# Patient Record
Sex: Male | Born: 1945
Health system: Southern US, Community
[De-identification: ages and names within clinical notes are randomized; demographics above are authoritative.]

## PROBLEM LIST (undated history)

## (undated) ENCOUNTER — Emergency Department (HOSPITAL_COMMUNITY)

## (undated) DIAGNOSIS — Z8719 Personal history of other diseases of the digestive system: Secondary | ICD-10-CM

## (undated) DIAGNOSIS — C801 Malignant (primary) neoplasm, unspecified: Secondary | ICD-10-CM

## (undated) DIAGNOSIS — I5020 Unspecified systolic (congestive) heart failure: Secondary | ICD-10-CM

## (undated) DIAGNOSIS — E785 Hyperlipidemia, unspecified: Secondary | ICD-10-CM

## (undated) DIAGNOSIS — A809 Acute poliomyelitis, unspecified: Secondary | ICD-10-CM

## (undated) DIAGNOSIS — I219 Acute myocardial infarction, unspecified: Secondary | ICD-10-CM

## (undated) DIAGNOSIS — N529 Male erectile dysfunction, unspecified: Secondary | ICD-10-CM

## (undated) DIAGNOSIS — I2510 Atherosclerotic heart disease of native coronary artery without angina pectoris: Secondary | ICD-10-CM

## (undated) HISTORY — DX: Acute myocardial infarction, unspecified: I21.9

## (undated) HISTORY — PX: CORONARY STENT PLACEMENT: SHX1402

## (undated) HISTORY — DX: Unspecified systolic (congestive) heart failure: I50.20

## (undated) HISTORY — DX: Hyperlipidemia, unspecified: E78.5

## (undated) HISTORY — PX: CARDIAC CATHETERIZATION: SHX172

## (undated) HISTORY — DX: Acute poliomyelitis, unspecified: A80.9

## (undated) HISTORY — PX: APPENDECTOMY: SHX54

## (undated) HISTORY — PX: OTHER SURGICAL HISTORY: SHX169

## (undated) HISTORY — DX: Malignant (primary) neoplasm, unspecified: C80.1

## (undated) HISTORY — PX: CERVICAL SPINE SURGERY: SHX589

---

## 2000-08-31 ENCOUNTER — Inpatient Hospital Stay (HOSPITAL_COMMUNITY): Admission: EM | Admit: 2000-08-31 | Discharge: 2000-09-03 | Payer: Self-pay | Admitting: *Deleted

## 2000-08-31 ENCOUNTER — Encounter: Payer: Self-pay | Admitting: *Deleted

## 2000-09-24 ENCOUNTER — Inpatient Hospital Stay (HOSPITAL_COMMUNITY): Admission: AD | Admit: 2000-09-24 | Discharge: 2000-09-25 | Payer: Self-pay | Admitting: *Deleted

## 2008-08-08 ENCOUNTER — Inpatient Hospital Stay (HOSPITAL_COMMUNITY): Admission: EM | Admit: 2008-08-08 | Discharge: 2008-08-10 | Payer: Self-pay | Admitting: Emergency Medicine

## 2008-08-08 ENCOUNTER — Ambulatory Visit: Payer: Self-pay | Admitting: Internal Medicine

## 2008-08-21 ENCOUNTER — Ambulatory Visit: Payer: Self-pay | Admitting: Cardiology

## 2008-10-03 ENCOUNTER — Ambulatory Visit: Payer: Self-pay | Admitting: Cardiology

## 2008-12-11 DIAGNOSIS — I2510 Atherosclerotic heart disease of native coronary artery without angina pectoris: Secondary | ICD-10-CM | POA: Insufficient documentation

## 2008-12-11 DIAGNOSIS — E7800 Pure hypercholesterolemia, unspecified: Secondary | ICD-10-CM | POA: Insufficient documentation

## 2009-02-06 ENCOUNTER — Encounter (INDEPENDENT_AMBULATORY_CARE_PROVIDER_SITE_OTHER): Payer: Self-pay | Admitting: *Deleted

## 2010-01-23 ENCOUNTER — Telehealth (INDEPENDENT_AMBULATORY_CARE_PROVIDER_SITE_OTHER): Payer: Self-pay | Admitting: *Deleted

## 2010-10-15 NOTE — Progress Notes (Signed)
  pt signed ROI,Faxed over to [facility] [phone] [facility]  Jan 23, 2010 11:47 AM

## 2011-01-28 NOTE — Assessment & Plan Note (Signed)
[facility]                            CARDIOLOGY OFFICE NOTE   QUASHON, JESUS                        MRN:          [id number]  DATE:10/03/2008                            DOB:          11-23-1945    Mr. Vanderwall is in for followup.  In general, he is stable.  He has not  been having any ongoing chest pain or shortness of breath.  He is no  longer smoking.  He stopped taking simvastatin because he was convinced  that it caused dizziness.  He is able to take his beta-blocker.   MEDICATIONS:  The patient stopped taking Plavix because he ran out of it  2 weeks ago.  He is on aspirin 81 mg 2 b.i.d.  He stopped simvastatin  and he is on metoprolol 25 mg half tablet b.i.d.   PHYSICAL EXAMINATION:  VITAL SIGNS:  Blood pressure 108/76 and pulse 84.  LUNGS:  Lungs fields clear.  CARDIAC:  Rhythm is regular.   We had a long discussion today.  We will bring him back in for next  exercise treadmill study.  He does not want to take alternatives statin  at the current time.  He cannot get the Plavix currently because of his  current income and financial situation.  I plan to have him back and  follow up at that time, and get him back for treadmill.     Arturo Morton. Riley Kill, MD, [facility]  Electronically Signed    TDS/MedQ  DD: 10/03/2008  DT: 10/04/2008  Job #: [id number]

## 2011-01-28 NOTE — H&P (Signed)
NAME:  [name] [name], [name] [name]               ACCOUNT NO.:  [id number]   MEDICAL RECORD NO.:  [id number]          PATIENT TYPE:  INP   LOCATION:  2918                         FACILITY:  [facility]   PHYSICIAN:  Bevelyn Buckles. Bensimhon, MDDATE OF BIRTH:  1945/10/21   DATE OF ADMISSION:  08/08/2008  DATE OF DISCHARGE:                              HISTORY & PHYSICAL   CHIEF COMPLAINT:  Chest pain.   HISTORY OF PRESENT ILLNESS:  The patient is a 65 year old man with  history of coronary artery disease status post PCI and stenting,  hyperlipidemia, ex-smoker, who presented to ED with left-sided chest  pain that started this morning at rest.  The pain was described as being  a pressure-like sensation with radiation to the left hand that felt like  tingling down his left upper extremity.  He described the pain as being  associated with nausea but there was no vomiting.  He denied any  shortness of breath, cough, fever, leg swelling, history of DVT or PE,  syncope, or sweating.  He took 2 aspirin and 1 nitro but that did not  help him much.  After which, he decided to come to ED where he got one  more sublingual nitro, after which the pain subsided, lasting for a  total of 1-2 hours.   ALLERGIES:  No known allergies.   PAST MEDICAL HISTORY:  1. Coronary artery disease with history of acute inferior wall MI in      December 2001.  He subsequently got stenting of distal RCA followed      by stenting to LAD and PCI to OM-2.  2. Hyperlipidemia.  3. History of tobacco abuse.  4. 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   MEDICATIONS:  Aspirin 81 mg twice daily.   SOCIAL HISTORY:  He lives with his son and his wife.  He is currently  unemployed.  He quit smoking 2 months ago, and he drinks 1-2 beers every  day.  He does not have any history of drug abuse.   FAMILY HISTORY:  Positive for coronary artery disease and diabetes in  his mother who passed away in her 15s.  His father is 56 and  alive, but  has had coronary artery disease since his 29s.   REVIEW OF SYSTEMS:  Positive for chest pain and nausea, and negative for  constitutional symptoms, headache, dizziness, skin rash, genitourinary  symptoms, neuropsychiatric symptoms, endocrine symptoms.   PHYSICAL EXAMINATION:  VITAL SIGNS:  Temperature 97.4, pulse 65,  respiratory rate 22, blood pressure 173/100, repeat blood pressure  110/72, oxygen saturation 100% on 2 liters.  GENERAL:  NAD.  HEAD, EYE, ENT:  Normocephalic, atraumatic.  Moist mucous membranes.  Oropharynx clear without erythema or exudate.  NECK:  Supple.  No thyromegaly.  No JVD.  No cervical lymphadenopathy.  CARDIOVASCULAR:  Regular rate and rhythm with normal heart sounds  without any murmur, rubs, or gallops.  CHEST:  Clear to auscultation bilaterally.  ABDOMEN:  Soft, nontender, nondistended.  Normal bowel sounds without  any organomegaly.  EXTREMITIES:  No cyanosis, clubbing, or edema.  NEUROLOGIC:  Alert and oriented x3.  Cranial nerves II-XII intact.  No  focal neurological deficits.   LABORATORY DATA:  1. Chest x-ray, negative for any acute findings.  Chronic lung changes      present.  2. Electrocardiogram, negative for acute ischemic changes.  Positive      for J-point elevation in the chest leads.  3. Hemoglobin 14.5, hematocrit 32.1, WBC 7.9, platelets 175.  Sodium      140, potassium 4.6, chloride 104, bicarbonate 29, BUN 18,      creatinine 1, blood glucose 100.  CK-MB 2.5, troponin 0.05,      myoglobin 112, D-dimer 0.39.   ASSESSMENT AND PLAN:  This is a 65 year old man with history of coronary  artery disease status post PCI and stenting as well as all the risk  factors with hyperlipidemia, tobacco abuse, and family history of  coronary artery disease presenting with left-sided chest pain that  started at rest.  The patient's pain is very much concerning for acute  coronary syndrome and unstable angina.  The patient was offered   admission to the Step-Down Unit for close monitoring with cycling of  cardiac enzymes with possible early catheterization along with  anticoagulation therapy. Initially patient refused admission citing  financial issues but he started to have persistent pain and his cardiac  enzymes also came back elevated. Patient was reviewed by Dr. Teressa Lower,  he is started on IV heparin and is planned for immediate cardiac  cathterization.      [name], MD  Electronically Signed      Bevelyn Buckles. Bensimhon, MD  Electronically Signed    AS/MEDQ  D:  08/08/2008  T:  08/09/2008  Job:  [id number]

## 2011-01-28 NOTE — Discharge Summary (Signed)
NAME:  [name] [name]               ACCOUNT NO.:  [id number]   MEDICAL RECORD NO.:  [id number]          PATIENT TYPE:  INP   LOCATION:  2918                         FACILITY:  [facility]   PHYSICIAN:  Luis Abed, MD, FACCDATE OF BIRTH:  11-24-1945   DATE OF ADMISSION:  08/08/2008  DATE OF DISCHARGE:  08/10/2008                               DISCHARGE SUMMARY   PRIMARY CARDIOLOGIST:  Maisie Fus D. Riley Kill, MD, [facility]   DISCHARGING DIAGNOSES:  1. Coronary artery disease status post cardiac catheterization this      admission in the setting of a non-ST elevated myocardial      infarction.  The patient underwent cardiac catheterization.      Initial attempted stent placement unable to pass stent.  The      patient with balloon angioplasty.  Possible follow up percutaneous      coronary intervention of the right coronary artery if needed.  For      now continue medical therapy with Plavix and aspirin.  2. Known coronary artery disease, previous stenting as described in      H&P.  3. Previous tobacco use.   Mr. [name] [name] presented to [facility] on the day of admission complaining  of chest discomfort, known coronary artery disease status post previous  intervention/stents.  Initially Ms. [name] [name] considered leaving AMA, but  then decided to stay.  He underwent cardiac catheterization on August 08, 2008, by Dr. Gala Romney.  The patient with three-vessel coronary  artery disease with totally occluded LAD.  Initial plans for a stent  placement by Dr. Riley Kill, however, unable to pass stent.  The patient  received angioplasty medical therapy.  Dr. Riley Kill will follow up  outpatient for further consideration of PCI.  If symptoms reoccur also  would lean towards bypass potentially.  The patient should remain on  clopidogrel indefinitely.  At the time of discharge, medications include  Plavix 75, aspirin 325, Crestor 40, Toprol-XL 25.  The patient has been  given a post cardiac catheterization discharge  instructions.  He will  follow up with Dr. Riley Kill 1-2 weeks.  Office has been notified to call  the patient with an appointment tomorrow.  The patient has also received  the Plavix assistance form, I have faxed a copy to [employer] and the  patient will mail the form from home also.   DURATION OF DISCHARGE ENCOUNTER:  Over 30 minutes between myself and Dr.  Myrtis Ser and Dr. Riley Kill.      [name] [name], ACNP      Luis Abed, MD, [facility]  Electronically Signed    MB/MEDQ  D:  08/10/2008  T:  08/10/2008  Job:  [phone]

## 2011-01-28 NOTE — Assessment & Plan Note (Signed)
[facility]                            CARDIOLOGY OFFICE NOTE   ASAIAH, SCARBER                        MRN:          [id number]  DATE:08/21/2008                            DOB:          [birth year]    [name] [name] is in for a followup visit.  In general, he has been stable.  He presented with closure of his LAD.  We did balloon angioplasty on  this, and we did not place an additional stent as we had difficulty  getting the stent to the LAD site beyond the previous stent.  He had a  modest lesion in the mid right coronary.  He has stopped smoking  previously within the last year.  He questioned about the possibility of  getting disability.  Since discharge from the hospital, he has been  getting along reasonably well, but it might be noted that he is not  taking either Crestor or Toprol-XL because of their cost.  He is  remaining only on Plavix and aspirin at the present time.   PHYSICAL EXAMINATION:  Today, the blood pressure is 130/70, the pulse is  82.  The lung fields are generally clear and there is a soft S4 gallop.   The electrocardiogram demonstrates normal sinus rhythm.  There is delay  in R-wave progression with T-wave inversion in lead V4.  There is an old  inferior infarct.   IMPRESSION:  1. Coronary artery disease with prior percutaneous intervention now      with distal occlusion of the left anterior descending (coronary      artery) stent status post percutaneous transluminal coronary      angioplasty of the left anterior descending (coronary artery).  2. Hypercholesterolemia on lipid lowering therapy.  3. Complicated finances resulting in medication noncompliance.   RECOMMENDATIONS:  He did bring the list so we can get his medicines more  cheaply.  We will give him simvastatin 40 mg daily and metoprolol 25 mg  one-half tablet p.o. b.i.d.  We will have him back in follow up in about  6 weeks in the office and at that time, likely do  a 2D echocardiogram to  reevaluate his status.  We will encourage him to remain active at the  present time.  I will see him back in follow up in 6 weeks.   ADDENDUM:  EKG reveals chronic lung changes, otherwise unremarkable.     Arturo Morton. Riley Kill, MD, [facility]  Electronically Signed    TDS/MedQ  DD: 08/21/2008  DT: 08/22/2008  Job #: [id number]

## 2011-01-28 NOTE — Cardiovascular Report (Signed)
NAME:  [name] [name], [name] [name]               ACCOUNT NO.:  [id number]   MEDICAL RECORD NO.:  [id number]          PATIENT TYPE:  INP   LOCATION:  2918                         FACILITY:  [facility]   PHYSICIAN:  Bevelyn Buckles. Bensimhon, MDDATE OF BIRTH:  [birth year]   DATE OF PROCEDURE:  08/08/2008  DATE OF DISCHARGE:                            CARDIAC CATHETERIZATION   PATIENT IDENTIFICATION:  [name] [name] is a 65 year old male with a history  of coronary artery disease with an inferior infarct.  In December 2001,  he was treated with stenting of the right coronary artery and the LAD as  well as balloon angioplasty only of marginal branch.  He has done well  since that time.  He has been essentially noncompliant with all his  medications except for baby aspirin.  He smoked up until 2 months ago  when he quit.  This morning, he developed severe chest pain radiating  down his arm.  He came to the emergency room.  First set of cardiac  markers were normal.  EKG was nonacute.  However, second set of cardiac  markers came back positive and he ruled in for non-ST-elevation  myocardial infarction.  He is brought to the cardiac catheterization  lab.   PROCEDURES PERFORMED:  1. Selective coronary angiography.  2. Left heart catheterization.  3. Left ventriculogram.   DESCRIPTION OF PROCEDURE:  The risks and indications were explained.  Consent was signed and placed on the chart.  A 5-French arterial sheath  was placed in the right femoral artery using a modified Seldinger  technique.  Standard catheters including a JL-4, JR-4, and angled  pigtail were used for the procedure.  All catheter exchanges were made  over wire.  There were no apparent complications.   HEMODYNAMICS:  Central aortic pressure was 121/67 with a mean of 88.  LV  pressure was 119 with an EDP of 20.  There was no aortic stenosis.  Left  main had mild plaquing about 20% luminal plaque.   LAD was a large vessel.  There was a stent in the  midsection which was  totally occluded.  Prior to the stent, there was mild 30% plaquing.  The  distal LAD filled faintly.   Left circumflex gave off a small OM-1, a large OM-2, and a small OM-3.  There was a 30% lesion in the mid AV groove circumflex in the ostium of  the first marginal with approximately 60-70% lesion in the small  marginal branch.   The right coronary artery was a large dominant vessel that gave off a  PDA and several posterolaterals.  There was a focal calcified 75% lesion  in the midsection in the distal section prior to the acute marginal  branch.  There was a previous stent placed with just mild in-stent  restenosis about 30%.  There was diffuse 30-40% plaquing throughout the  distal RCA.   Left ventriculogram done in the RAO position showed an EF of  approximately 50-55% with inferior basilar akinesis.   ASSESSMENT:  1. Three-vessel coronary artery disease as described above with total      left  anterior descending lesion secondary to in-stent restenosis.[name]  2. A 75% lesion in the mid right coronary artery.  3. Ejection fraction of 50-55% which is in the low normal range.   PLAN/DISCUSSION:  I suspect his infarct artery is the LAD, although he  does have a significant disease in the mid right coronary artery.  The  plan now will be for PCI of the LAD with Dr. Riley Kill.  As his medical  compliance is an issue, he may be best served with a bare-metal stent  due to his inability for Plavix.  I will discuss with Dr. Riley Kill the  plan regarding the right coronary artery whether we will proceed with  staged intervention or medical therapy.      Bevelyn Buckles. Bensimhon, MD  Electronically Signed     DRB/MEDQ  D:  08/08/2008  T:  08/09/2008  Job:  [id number]

## 2011-01-28 NOTE — Cardiovascular Report (Signed)
NAME:  [name] [name], [name] [name]               ACCOUNT NO.:  [id number]   MEDICAL RECORD NO.:  [id number]          PATIENT TYPE:  INP   LOCATION:  2918                         FACILITY:  [facility]   PHYSICIAN:  Arturo Morton. Riley Kill, MD, FACCDATE OF BIRTH:  08/24/1946   DATE OF PROCEDURE:  DATE OF DISCHARGE:  08/10/2008                            CARDIAC CATHETERIZATION   INDICATIONS:  The patient is a delightful 65 year old male, whose father  is a patient of mine.  Mr. Livecchi presented with positive enzymes, some  ongoing chest pain.  He was brought to the catheterization laboratory by  Dr. Gala Romney.  The stent was graded as scattered disease of the  circumflex system, and the RCA is noted in his report.  It also  demonstrated basically occlusion of the stent to the LAD, and this had  been put in quite some time ago.  He also had some collateralization  from the RCA to the LAD.  However, it was felt that percutaneous  intervention was indicated.  As a result, we elected to proceed.   This was discussed with the patient and he was agreeable.  Importantly,  the 5-French sheath was exchanged for a 6-French sheath.  Versed and  fentanyl were given because he had been on the table on fractionated  heparin and eptifibatide were administered because of the clotted stent.  We used a [employer], 6-French FL-3.5 as a guiding  catheter.  ACT was checked and found to be appropriate at 343.  We were  able to open the vessel by crossing with a guide wire initially.  Several Prowater wires were utilized as it selectively engaged the  diagonal and a steep enough bent was utilize to ultimately get the wire  down the LAD.  The lesion was then predilated with a 1.5 x 15 Apex Flex  balloon up at 8 atmospheres.  There was marked improvement in the  appearance of the artery.  Several inflations were then performed with a  2.25 x 20 Apex balloon.  We then attempted to pass a 2.75 x 32 non-drug-  eluting  stent.  This was chosen in part because of the patient's  economic situation with questions as to whether or not he could be  treated with Plavix.  There was a second lesion also above the stented  area.  We therefore chose a 2.75 x 32 VeriFLEX [employer] scientific stent.  We had difficulty getting the stent down.  We then put a BMW wire down  to use as a buddy system.  Despite this, we continued to have difficulty  getting the stent down in place.  We had a good angiographic result in  the in-stent restenosis because of the tortuosity and the rigidity, we  continued to have trouble getting the stent down.  As a result, the  initial stent strategy was abandoned.  The lesion was then dilated with  a 2.75-mm balloon with a really good angiographic result.  Based upon  the findings at this point, and the patient's improvement, I elected to  terminate the procedure without additional attempts to pass  a stent  through the previously placed mid vessel stent.  He remained stable and  was taken to the holding area in satisfactory clinical condition after  removing all catheters and sewing in of the sheaths.   ANGIOGRAPHIC DATA:  The left anterior descending artery which was the  target vessel was tortuous, heavily calcified, and previously stented.  There was calcium and some plaquing proximally.  The stent demonstrates  in-stent restenosis with significant narrowing at the distal third of  the stent extending distally.  There was multiple areas of 30% and 40%  narrowing more proximally and calcified disease overlying the diagonal.  Following balloon dilatation, the final result resulted in a reduction  to less than 20% with good TIMI-3 flow into the diagonal and LAD  distally.  The diagonal just after the stent site has about 50%-70%  segmental plaquing in the midportion of that vessel, but with excellent  flow.   CONCLUSION:  Successful percutaneous balloon angioplasty of in-stent  restenosis in  a heavily calcified left anterior descending artery.   DISPOSITION:  The patient will be treated medically at the present time.  We will see him in close in followup in the office.  If he does develop  recurrent restenosis, this may be able to be treated with a somewhat  shorter stent.  At the present time, we will continue medical therapy.  With his compromised finances, we may elect to limit Plavix duration,  but I will discuss this with the patient in followup.      Arturo Morton. Riley Kill, MD, [facility]  Electronically Signed     TDS/MEDQ  D:  08/13/2008  T:  08/14/2008  Job:  [id number]

## 2011-01-31 NOTE — Discharge Summary (Signed)
[place]. [facility]  Patient:    [name] [name], [name] [name]                        MRN: [id number] Adm. Date:  [id number] Disc. Date: 09/25/00 Attending:  Daisey Must Dictator:   Tereso Newcomer, P.A.                           Discharge Summary  DISCHARGE DIAGNOSES: 1. Coronary artery disease. 2. Status post inferior myocardial infarction on August 31, 2000. 3. Status post emergent stent to the right coronary artery on    August 31, 2000. 4. Residual coronary artery disease (left anterior descending proximal 50%    into mid, then 80%.  Obtuse marginal-2 with 80% stenosis.  Right coronary    artery proximal 30%/40%, distal 30% before posterior descending artery). 5. Hyperlipidemia. 6. Tobacco abuse. 7. Erectile dysfunction secondary to beta blocker therapy.  PROCEDURES:  Percutaneous coronary intervention by Dr. Daisey Must on September 23, 2000, status post percutaneous transluminal coronary angioplasty and stent to mid left anterior descending with reduction of stenosis from 75% to 0%, status post percutaneous transluminal coronary angioplasty of the obtuse marginal-2 with reduction in stenosis of 80% to 25%.  HISTORY OF PRESENT ILLNESS:  [name] was seen at [facility] on August 31, 2000, for acute inferior wall myocardial infarction.  He underwent emergency catheterization, angioplasty and stenting of the RCA. Ejection fraction at that time was 57%.  His postoperative course was unremarkable.  He was discharged to home on September 03, 2000.  He followed up in the office on September 18, 2000.  At that time, he noted he had been very active and had not had any angina, exertional dyspnea, orthopnea, PND or edema.  He complained of occasional mild episodes of orthostatic lightheadedness were were short lived.  Unfortunately, he continued to smoke. At that time, he was set up for elective PCI for the residual CAD in his OM-2 and LAD.  ALLERGIES:  No  known drug allergies.  PHYSICAL EXAMINATION:  GENERAL:  Well-developed male in no distress.  VITAL SIGNS:  Blood pressure 104/60, pulse 63.  NECK:  Without JVD or bruits.  CHEST:  Clear to auscultation and percussion.  CARDIAC:  Apical impulse non-displaced, regular rate and rhythm with positive S4, normal S1, S2 without rub, murmur or S3.  ABDOMEN:  Soft, nontender.  EXTREMITIES:  Without edema.  LABORATORY DATA AND X-RAY FINDINGS:  EKG with normal sinus rhythm, heart rate 63.  T wave inversions in inferior leads consistent with recent evolving inferior MI.  HOSPITAL COURSE:  The patient came to the hospital on September 23, 2000, for elective PCI.  He underwent the procedure noted above.  He had no immediate complications.  On September 24, 2000, he denied any pain.  It was noted that his troponin was slightly elevated from 0.01 to 0.41.  This was repeated.  It was still elevated at 0.84.  Therefore, he was watched overnight.  He did well.  On the morning of September 25, 2000, he was in stable condition without chest pain.  His CK was down to 94 and his troponin I was 0.51.  Therefore, it was felt he was stable enough for discharge to home.  He would continue on aspirin, Plavix, Atenolol and Lipitor.  DISCHARGE LABORATORY DATA:  White count 8700, hemoglobin 13.8, hematocrit 41.1, platelet count 231,000.  Sodium 136, potassium 3.6, chloride  102, CO2 27, glucose 118, BUN 11, creatinine 0.9.  Cardiac enzymes showed CK 76, MB 1.4, troponin I 0.01; CK 110, MB 4.9, troponin I 0.41; CK 157, MB 10.3, troponin I 0.84; CK 142, MB 7.5; CK 94, MB 3.4 and troponin I of 0.51.  DISCHARGE MEDICATIONS: 1. Plavix 75 mg p.o. q.d. x 4 weeks. 2. Enteric coated aspirin 325 mg q.d. 3. Atenolol 25 mg q.d. 4. Lipitor 10 mg q.h.s. 5. Nitroglycerin 0.4 mg p.r.n. chest pain.  ACTIVITY:  No driving, heavy lifting, exertion or sex for three days.  DIET:  Low fat, low sodium diet.  WOUND CARE:  The  patient is to watch his wound for increased swelling, bleeding or bruising and call with concerns.  SPECIAL INSTRUCTIONS:  Watch groin for any increased swelling, bleeding or bruising and call office with concerns.  FOLLOWUP:  He is to see the physician assistant for Dr. Gerri Spore on October 02, 2000, at 10 a.m.  He is to see Dr. Gerri Spore on October 23, 2000, at 2:30 p.m.  At the February appointment, he should have his LFTs and lipid profile checked. DD:  09/25/00 TD:  09/25/00 Job: [id number]

## 2011-01-31 NOTE — Cardiovascular Report (Signed)
[place]. [facility]  Patient:    [name] [name], [name] [name]                        MRN: [id number] Proc. Date: 09/23/00 Adm. Date:  [id number] Attending:  Daisey Must CC:         Cardiac Catheterization Lab   Cardiac Catheterization  PROCEDURE PERFORMED: 1. Selective coronary angiography of the right coronary artery. 2. Percutaneous transluminal coronary angioplasty stent placement, mid left    anterior descending artery. 3. Percutaneous transluminal coronary angioplasty of the second obtuse    marginal branch.  INDICATIONS:  [name] [name] is a 65 year old male who suffered an acute inferior wall myocardial infarction on August 31, 2000.  He underwent stent placement in the distal right coronary artery at that time.  Coronary angiography at that time revealed significant disease in the left system including a long 50%, followed by a 75%, stenosis in the mid LAD and an 80% stenosis in a second obtuse marginal branch.  He returned to the catheterization laboratory today for a planned staged intervention of the left anterior descending artery and the obtuse marginal branch.  PROCEDURAL NOTE:  A 7-French sheath was placed in the right femoral artery. Heparin and Integrilin were administered per protocol.  We initially performed diagnostic angiography of the right coronary artery with a 6-French JR4 catheter.  We then proceeded with coronary intervention.  We used a 7-French JL3.5 guiding catheter and a BMW wire.  We initially treated the lesion in the LAD.  Predilatation of this lesion was performed with a 2.5- x 12-mm Quantum Ranger balloon inflated to 12 atmospheres.  We then deployed a 2.75- x 18-mm Penta stent at a deployment pressure of 10 atmospheres.  The stent was post dilated with a 2.75- x 15-mm Quantum balloon inflated to 15 atmospheres in the distal aspect of the stent and 13 atmospheres in the proximal aspect of the stent.  Angiographic images revealed  patency of the LAD with 0% residual stenosis and TIMI-3 flow.  We then turned our attention to the obtuse marginal branch.  We repositioned the BMW wire into the distal aspect of the obtuse marginal.  We initially attempted to pass a 2.5- x 12-mm Quantum balloon over this wire; however, it would not cross due to severe angulation and tortuosity of the proximal vessel.  We then used a 2.5- x 15-mm Maverick balloon which was able to be advanced to the lesion site where it was inflated initially to 8 atmospheres. We then exchanged through the lumen of the balloon the BMW wire for a Mailman wire with the Mailman wire being advanced into a distal obtuse marginal branch.  We then attempted to pass a 2.5- x 15-mm AVE stent over this wire; however, again, due to the proximal tortuosity and angulation of the vessel, the stent would not reach the lesion.  We therefore went back with our 2.5- x 15-mm Maverick balloon and performed 2 additional inflations across the lesion, each to 12 atmospheres.  Final angiographic images revealed an acceptable result with 25% residual stenosis and TIMI-3 flow.  COMPLICATIONS:  None.  RESULTS: 1. Re-look angiography of the right coronary artery revealed patent stent in    the distal right coronary artery with 0% stenosis within the stent.  At the    ostium of the right coronary artery was a tubular 30% stenosis followed by    a 40% stenosis in the proximal vessel.  In the distal  vessel was moderate    ectasia followed by a 30% stenosis in the posterior descending artery.  The    anatomy of the right coronary artery was unchanged from immediately post    stent deployment. 2. Successful PTCA with stent placement in the mid LAD, reducing a 75%    stenosis to 0% residual with TIMI-3 flow. 3. Successful PTCA of the second obtuse marginal branch reducing an 80%    stenosis to 25% residual with TIMI-3 flow.  PLAN:  Integrilin will be continued for an additional 18  hours.  Plavix will be administered for 4 weeks. DD:  09/23/00 TD:  09/23/00 Job: [id number]

## 2011-01-31 NOTE — Cardiovascular Report (Signed)
[place]. [facility]  Patient:    [name] [name], [name] [name]                        MRN: [id number] Proc. Date: 08/31/00 Adm. Date:  [id number] Attending:  Lewayne Bunting CC:         Doylene Canning. Ladona Ridgel, M.D. [facility]  Cardiac Catheterization Laboratory   Cardiac Catheterization  PROCEDURES PERFORMED: 1. Left heart catheterization with coronary angiography, left    ventriculography, and abdominal aortography. 2. Percutaneous transluminal coronary angioplasty with stent placement    in the distal right coronary artery.  INDICATIONS:  Mr. Vacca is a 65 year old male, who presented to the emergency room with an acute inferior myocardial infarction.  CATHETERIZATION PROCEDURE:  A 7 French sheath was placed in the right femoral artery, 6 French sheath in the right femoral vein.  Diagnostic catheterization was performed with Standard Judkins 6 French catheters were utilized. Contrast was Hexabrix.  There were no complications.  RESULTS:  HEMODYNAMICS:  Left ventricular pressure 100/35.  Aortic pressure 88/64. There was no aortic valve gradient.  LEFT VENTRICULOGRAM:  There is moderate akinesis of the inferior wall. Ejection fraction calculated at 57%.  No mitral regurgitation.  ABDOMINAL AORTOGRAM:  Abdominal aortography revealed patent abdominal aorta renal arteries and iliac arteries without significant atherosclerotic ______.  CORONARY ARTERIOGRAPHY:  (Right dominant).  Left main:  Left main has a distal minor luminal irregularity.  Left anterior descending:  The left anterior descending artery has a 50% stenosis beginning in the proximal vessel, extending into the mid vessel. Further down in the mid vessel is an 80% stenosis.  The LAD gives rise to a small first diagonal and a large second diagonal.  The 80% stenosis in the mid LAD is just proximal to the second diagonal.  Left circumflex:  The left circumflex gives rise to a normal sized first marginal branch and a  large branching second marginal branch.  There is an 80% stenosis in the proximal portion of the second marginal branch.  Right coronary artery:  The right coronary artery is a dominant vessel.  The proximal vessel has a 30% stenosis and the mid vessel has a 50% stenosis. In the distal vessel just proximal to the posterior descending artery is a 99% stenosis with thrombus and TIMI-2 flow.  The length of this lesion is approximately 15 mm.  In the distal right coronary artery just after the first posterolateral branch is a tubular 40% stenosis.  The distal right coronary artery gives rise to a large posterior descending artery, a normal sized first posterolateral branch, followed by four smaller posterolateral branches.  IMPRESSIONS: 1. Left ventricular systolic function at lower range of normal with moderate    inferior akinesis. 2. Three-vessel coronary artery disease with the culprit lesion being the 99%    stenosis in the distal right coronary artery.  PLAN:  Percutaneous intervention to the right coronary artery.  See below.  PERCUTANEOUS TRANSLUMINAL CORONARY ANGIOPLASTY PROCEDURE:  Following completion of diagnostic catheterization, we proceeded directly with coronary intervention.  Heparin and ReoPro were administered per protocol.  We used a 7 [place] guiding catheter with side holes and a Hi-Torque Floppy wire.  The lesion was initially dilated with a 2.5 x 20 mm Maverick balloon inflated to 8 atmospheres.  We then deployed a 2.75 x 18 mm Penta stent at 14 atmospheres. Angiographic images following stent deployment revealed an excellent result with 0% residual stenosis and TIMI-3 flow.  COMPLICATIONS:  None.  RESULTS:  Successful PTCA with stent placement in the distal right coronary artery reducing a 99% stenosis with thrombus and TIMI-3 flow to 0% residual with TIMI-3 flow.  PLAN:  ReoPro will be continued for 12 hours.  Plavix will be administered for four  weeks.  The patient does have significant residual disease in the left anterior descending artery and left circumflex coronary artery.  These images will be further reviewed with treatment options being staged percutaneous intervention to the left anterior descending artery and the left circumflex versus coronary artery bypass grafting. DD:  08/31/00 TD:  08/31/00 Job: [id number]

## 2011-01-31 NOTE — Discharge Summary (Signed)
[place]. [facility]  Patient:    [name] [name], [name] [name]                        MRN: [id number] Adm. Date:  [id number] Disc. Date: [id number] Attending:  Lewayne Bunting Dictator:   Tereso Newcomer, P.A. CC:         [facility], [place], [place].   Discharge Summary  DATE OF BIRTH:  Jul 21, 1946  DISCHARGE DIAGNOSES: 1. Acute inferior myocardial infarction. 2. Coronary artery disease. 3. Hyperlipidemia. 4. Tobacco smoker. 5. Family history of coronary artery disease.  PROCEDURES PERFORMED THIS ADMISSION: 1. An emergent cardiac catheterization on, August 31, 2000, by Dr. Loraine Leriche    Pulsipher revealing left ventriculogram:  Moderate inferior akinesis with    an EF of 57%, no MR.  Abdominal aorta normal.     Coronaries:  Left main distal less than 20%, LAD proximal 50% into mid    then 80 80% left circumflex, OM-2 large with 80% stenosis.  RCA mid 50%,    proximal 30%, distal 99 with thrombus, TIMI-2 flow before PDA.  Distal 70    after PDA.  2. Percutaneous coronary intervention status post percutaneous transluminal    coronary angioplasty and stent of the distal RCA with reduction in stenosis    at 99% to 0% with improvement of flow to TIMI-3.  ADMISSION HISTORY:  This 65 year old male presented to the emergency room with substernal chest pain, nausea, diaphoresis.  His EKG revealed acute inferior MI.  He had taken 2 aspirin.  His pain at initial interview was 2-3 out of 10. His EKG revealed 3 mm ST elevation in the inferior leads and reciprocal T wave inversion in V1 through 2.  He was placed on heparin.  INITIAL PHYSICAL EXAMINATION:  GENERAL:  Revealed a middle age male in no acute distress.  Blood pressure 100/60, pulse 80, respirations 24.  NECK:  JVP at 7 cm, no bruits.  LUNGS:  Clear.  ABDOMEN:  Soft.  CARDIAC:  Regular rate and rhythm with S4.  EXTREMITIES:  Without edema.  EKG:  Normal sinus rhythm with inferior MI changes.  CHEST  X-RAY:  No acute changes.  INITIAL LABORATORY DATA:  Potassium 3.8, sodium 143, hemoglobin 17, hematocrit 49.  HOSPITAL COURSE:  The patient was emergently taken to the cardiac catheterization lab.  The results are noted above.  He tolerated the procedure well and had no immediate complications.  On the morning of September 01, 2000, he was found to be in stable condition without further chest pain.  His cardiac enzymes initially were 154, CK-MB was 2.6, troponin I was less than 0.01.  His enzymes rose to a total CK of 2,240 and a CK-MB of 188.8.  The last recorded enzymes were a total CK 1,182, CK-MB 49.6.  He was started on lipitor for hyperlipidemia.  His LDL was 111, HDL 28, total cholesterol 962, triglycerides 111.  Cines were reviewed between Dr. Juanda Chance and Dr. Gerri Spore.  It was felt that the LAD and left circumflex residual lesions could be treated percutaneously.  It was decided that this would be treated with elective percutaneous coronary intervention in about 2 weeks.  The patient ambulated with cardiac rehabilitation and did well.  He denied any chest pain.  Smoking cessation was discussed with the patient and he expressed that he had no problems stopping smoking.  He was kept on beta blocker and his Lopressor was changed to Toprol XL 50 mg  a day for September 02, 2000.  On the morning of September 03, 2000, he was found to be in stable condition without any further chest pain.  Blood pressure is 102/60, pulse 72, his groin was stable.  Therefore he was felt stable enough for discharge to home.  He will follow up with Dr. Gerri Spore in 1-2 weeks and go for elective PCI in 2-3 weeks.  LABORATORY DATA:  Prior to discharge white count 9,000.  Hemoglobin 12.9, hematocrit 34.5, platelet count 203,000.  INR 0.9, sodium 142, potassium 3.8, chloride 110, CO2 25, glucose 120, BUN 12, creatinine 0.7, calcium 7.3, total protein 6.9, albumin 3.6, AST 26, ALT 32, alk. phos. 66, total  bilirubin 0.5.  DISCHARGE MEDICATIONS: 1. Plavix 75 mg a day for 1 month. 2. Coated aspirin 325 mg a day. 3. Lipitor 10 mg at h.s. 4. Toprol XL 50 mg q. day. 5. Nitroglycerin 0.4 mg sublingual p.r.n. chest pain.  ACTIVITY:  No work or heavy exertion or sex until he sees Dr. Gerri Spore again at his follow up appointment.  DIET:  Low salt, low fat diet.  He is to call our office with any concerns over increased brain swelling, bleeding or bruising.  He is to see Dr. Gerri Spore on September 18, 2000, at 9:45 a.m. in our office in [place]. DD:  09/03/00 TD:  09/03/00 Job: [id number]

## 2011-06-17 LAB — CBC
HCT: 36.2 — ABNORMAL LOW
HCT: 36.6 — ABNORMAL LOW
HCT: 43.1
Hemoglobin: 12.8 — ABNORMAL LOW
Hemoglobin: 14.5
MCHC: 33.6
MCHC: 35.2
MCV: 91.9
MCV: 93.7
Platelets: 156
Platelets: 175
RBC: 3.93 — ABNORMAL LOW
RBC: 4.6
RDW: 13.3
RDW: 13.3
WBC: 6.6
WBC: 7.9

## 2011-06-17 LAB — LIPID PANEL
HDL: 25 — ABNORMAL LOW
Triglycerides: 155 — ABNORMAL HIGH
VLDL: 31

## 2011-06-17 LAB — HEPATIC FUNCTION PANEL
ALT: 21
AST: 25
Bilirubin, Direct: 0.1
Total Bilirubin: 0.6

## 2011-06-17 LAB — BASIC METABOLIC PANEL
BUN: 14
Calcium: 9.2
Chloride: 104
Creatinine, Ser: 0.99
GFR calc non Af Amer: >60
GFR calc non Af Amer: >60
Glucose, Bld: 102 — ABNORMAL HIGH
Potassium: 3.8
Sodium: 134 — ABNORMAL LOW

## 2011-06-17 LAB — DIFFERENTIAL
Basophils Absolute: 0.1
Basophils Relative: 1
Eosinophils Absolute: 0.2
Eosinophils Relative: 3
Lymphocytes Relative: 19
Lymphs Abs: 1.5
Monocytes Absolute: 0.5
Monocytes Relative: 7
Neutro Abs: 5.6
Neutrophils Relative %: 70

## 2011-06-17 LAB — POCT CARDIAC MARKERS
CKMB, poc: 3.5
Myoglobin, poc: 112
Troponin i, poc: 0.49
Troponin i, poc: <0.05

## 2011-06-17 LAB — COMPREHENSIVE METABOLIC PANEL
ALT: 21
BUN: 15
CO2: 27
Calcium: 9.2
Creatinine, Ser: 0.9
GFR calc non Af Amer: >60
Glucose, Bld: 64 — ABNORMAL LOW
Sodium: 141

## 2011-06-17 LAB — POCT I-STAT, CHEM 8
BUN: 18
Calcium, Ion: 1.2
Chloride: 104
Creatinine, Ser: 1
Glucose, Bld: 100 — ABNORMAL HIGH
HCT: 42
Hemoglobin: 14.3
Potassium: 4.6
Sodium: 140
TCO2: 29

## 2011-06-17 LAB — CARDIAC PANEL(CRET KIN+CKTOT+MB+TROPI)
CK, MB: 24.7 — ABNORMAL HIGH
CK, MB: 35.2 — ABNORMAL HIGH
Relative Index: 10.9 — ABNORMAL HIGH
Relative Index: 5.3 — ABNORMAL HIGH
Troponin I: 4.59
Troponin I: 7.84

## 2011-06-17 LAB — CK TOTAL AND CKMB (NOT AT ARMC)
CK, MB: 4.2 — ABNORMAL HIGH
Total CK: 129

## 2011-06-17 LAB — HEPARIN LEVEL (UNFRACTIONATED): Heparin Unfractionated: <0.1 — ABNORMAL LOW

## 2011-06-17 LAB — APTT: aPTT: 45 — ABNORMAL HIGH

## 2011-06-17 LAB — PROTIME-INR
INR: 2.9 — ABNORMAL HIGH
Prothrombin Time: 32.5 — ABNORMAL HIGH

## 2011-06-17 LAB — TROPONIN I: Troponin I: 0.02

## 2013-03-29 ENCOUNTER — Encounter: Payer: Self-pay | Admitting: *Deleted

## 2013-03-29 ENCOUNTER — Ambulatory Visit (INDEPENDENT_AMBULATORY_CARE_PROVIDER_SITE_OTHER): Payer: Medicare Other | Admitting: Family Medicine

## 2013-03-29 ENCOUNTER — Encounter: Payer: Self-pay | Admitting: Family Medicine

## 2013-03-29 VITALS — BP 126/79 | HR 80 | Temp 97.2°F | Ht 67.0 in | Wt 152.0 lb

## 2013-03-29 DIAGNOSIS — E785 Hyperlipidemia, unspecified: Secondary | ICD-10-CM

## 2013-03-29 DIAGNOSIS — N529 Male erectile dysfunction, unspecified: Secondary | ICD-10-CM

## 2013-03-29 DIAGNOSIS — L989 Disorder of the skin and subcutaneous tissue, unspecified: Secondary | ICD-10-CM

## 2013-03-29 DIAGNOSIS — I25810 Atherosclerosis of coronary artery bypass graft(s) without angina pectoris: Secondary | ICD-10-CM

## 2013-03-29 DIAGNOSIS — Z0000 Encounter for general adult medical examination without abnormal findings: Secondary | ICD-10-CM

## 2013-03-29 DIAGNOSIS — Z23 Encounter for immunization: Secondary | ICD-10-CM

## 2013-03-29 LAB — LIPID PANEL
Cholesterol: 166 mg/dL (ref 0–200)
HDL: 52 mg/dL (ref >39)
LDL Cholesterol: 93 mg/dL (ref 0–99)
Total CHOL/HDL Ratio: 3.2 Ratio
Triglycerides: 103 mg/dL (ref <150)
VLDL: 21 mg/dL (ref 0–40)

## 2013-03-29 LAB — COMPLETE METABOLIC PANEL WITH GFR
ALT: 20 U/L (ref 0–53)
AST: 20 U/L (ref 0–37)
Albumin: 4.2 g/dL (ref 3.5–5.2)
Alkaline Phosphatase: 55 U/L (ref 39–117)
BUN: 15 mg/dL (ref 6–23)
CO2: 28 mEq/L (ref 19–32)
Calcium: 9.5 mg/dL (ref 8.4–10.5)
Chloride: 106 mEq/L (ref 96–112)
Creat: 0.93 mg/dL (ref 0.50–1.35)
GFR, Est African American: >89 mL/min
GFR, Est Non African American: 85 mL/min
Glucose, Bld: 85 mg/dL (ref 70–99)
Potassium: 5 mEq/L (ref 3.5–5.3)
Sodium: 141 mEq/L (ref 135–145)
Total Bilirubin: 1 mg/dL (ref 0.3–1.2)
Total Protein: 6.4 g/dL (ref 6.0–8.3)

## 2013-03-29 LAB — POCT CBC
Granulocyte percent: 78.4 %G (ref 37–80)
HCT, POC: 45.3 % (ref 43.5–53.7)
Hemoglobin: 16.1 g/dL (ref 14.1–18.1)
Lymph, poc: 1.4 (ref 0.6–3.4)
MCH, POC: 33.3 pg — AB (ref 27–31.2)
MCHC: 35.5 g/dL — AB (ref 31.8–35.4)
MCV: 93.6 fL (ref 80–97)
MPV: 9.2 fL (ref 0–99.8)
POC Granulocyte: 5.6 (ref 2–6.9)
POC LYMPH PERCENT: 20.3 %L (ref 10–50)
Platelet Count, POC: 177 10*3/uL (ref 142–424)
RBC: 4.8 M/uL (ref 4.69–6.13)
RDW, POC: 13.6 %
WBC: 7.1 10*3/uL (ref 4.6–10.2)

## 2013-03-29 LAB — TSH: TSH: 1.027 u[IU]/mL (ref 0.350–4.500)

## 2013-03-29 MED ORDER — SILDENAFIL CITRATE 100 MG PO TABS: 50.0000 mg | ORAL_TABLET | Freq: Every day | ORAL | Status: DC | PRN

## 2013-03-29 NOTE — Patient Instructions (Addendum)
Coronary Artery Disease Coronary artery disease (CAD) is a process in which the heart (coronary) arteries narrow or become blocked from the development of atherosclerosis. Atherosclerosis is a disease in which plaque builds up on the inside of the heart arteries (coronary arteries). Plaque is made up of fats (lipids), cholesterol, calcium, and fibrous tissue. CAD can lead to a heart attack (myocardial infarction, MI). An MI can lead to heart failure, cardiogenic shock, or sudden cardiac death. CAD can cause an MI through:  Plaque buildup that can severely narrow or block the coronary arteries and diminish blood flow.  Plaque that can become unstable and "rupture." Unstable plaque that ruptures within a coronary artery can form a clot and cause a sudden (acute) blockage. RISK FACTORS Many risk factors contribute to the development of CAD. These include:  High cholesterol (dyslipidemia) levels.  High blood pressure (hypertension).  Smoking.  Diabetes.  Age.  Gender. Men can develop CAD earlier in life than women.  Family history.  Inactivity or lack of regular physical or aerobic exercise.  A diet high in saturated fats.  Chronic kidney disease. SYMPTOMS  When a coronary artery is narrowed or blocked, an MI can occur. MI symptoms can include:  Chest pain (agina). Angina can occur by itself or it can also occur with pain in the neck, arm, jaw, or in the upper, middle back (mid-scapular pain).  Profuse sweating (diaphoresis) without physical activity or movement.  Shortness of breath (dyspnea).  Irregular heartbeats (palpitations) that feel like your heart is skipping beats or is beating very fast.  Nausea.  Epigastric pain. Epigastric pain may occur as "heartburn."  Tiredness (malaise). This can especially be present in the elderly. Women can have different (atypical) symptoms other than classic angina.  DIAGNOSIS  The diagnosis of CAD may include:  An electrocardiography  (ECG). An ECG does not diagnose CAD, but it is usefull in the detection of a sudden (acute) MI or as a marker for a previous MI. Depending on which heart (coronary) artery may be blocked, an ECG may not pick up an MI pattern.  Exercise stress test. A stress test can be performed at rest for people who are unable to do an exercise stress test. A stress test will only be abnormal if one or more of the large coronary arteries is significantly blocked.  Blood tests. Tests may include samples to detect heart muscle damage (such as troponin levels). Other tests may include cholesterol checks and an inflammation test (high-sensitivity C-reactive protein, hs-CRP).  Coronary angiography.  Screening people who have peripheral vascular disease (PAD). These people often times have CAD. TREATMENT  The treatment of CAD includes the following:  Lifestyle changes such as:  Following a heart-healthy diet. A registered dietitian can you help educate you on healthy food options and changes.  Quiting smoking.  Following an exercise program approved by your caregiver.  Maintaining a healthy weight. Lose weight as approved by your caregiver.  Medicines to help control your blood pressure, cholesterol level, angina, and blood clotting. Medicines may include beta-blockers, ACE inhibitors, statins, nitrates, and anti-platelet medicines.  If you have a heart stent and are taking anti-platelet medicine, it is important to not suddenly stop taking this medicine. Suddenly stopping anti-platelet medicine can result in an MI. Talk with your caregiver before stopping medicine or if you cannot afford your medicine.  If the coronary arteries are significantly blocked, surgery may be needed. This can include:  Percutaneous coronary intervention (PCI) with or without stent placement.    Coronary artery bypass graft surgery (CABG). SEEK IMMEDIATE MEDICAL CARE IF:   You develop MI symptoms. This is a medical emergency. Get  help at once. Call your local emergency service (911 in the [place] immediately. Do not drive yourself to the clinic or hospital. MI symptoms can include:  Angina or pain that occurs in the neck, arm, jaw, or in the upper middle back.  Profuse sweating without cause.  Shortness of breath or difficulty breathing without cause.  Unexplained nausea or epigastric pain that feels like heartburn. Document Released: 11/24/2011 Document Reviewed: 11/24/2011 Exit[employer] Patient Information 2014 [employer].  

## 2013-03-29 NOTE — Progress Notes (Signed)
Patient ID: [name] [name], male   DOB: 06-Sep-1946, 67 y.o.   MRN: [id number]  Printed Rx for Viagra called in to [facility].

## 2013-03-29 NOTE — Progress Notes (Signed)
  Subjective:    Patient ID: [name] [name], male    DOB: 1945-10-14, 67 y.o.   MRN: [id number]  HPI This 67 y.o. male presents for evaluation of Physical Exam.  He has not had a  CPE in 12 years.  He has hx of CAD and 2 MI's, and 2 coronary artery stents. He sees Dr. Riley [facility].  He states he has not been seen In 3 or 4 years.  He denies SOB or chest pain.  Patient has hx of ED.  He  Has been noticing maintaining erection during intercourse.  He has a skin tag on his right  Abdomen.  He otherwise is doing well and has no acute complaints.   Review of Systems C/o ED sx's. No chest pain, SOB, HA, dizziness, vision change, N/V, diarrhea, constipation, dysuria, urinary urgency or frequency, myalgias, arthralgias or rash.     Objective:   Physical Exam Vital signs noted  Well developed well nourished male.  HEENT - Head atraumatic Normocephalic                Eyes - PERRLA, Conjuctiva - clear Sclera- Clear EOMI                Ears - EAC's Wnl TM's Wnl Gross Hearing WNL                Nose - Nares patent                 Throat - oropharanx wnl Respiratory - Lungs CTA bilateral Cardiac - RRR S1 and S2 without murmur GI - Abdomen soft Nontender and bowel sounds active x 4 Extremities - No edema. Neuro - Grossly intact. Rectal- Prostate wnl and smooth without masses.  Hemocult stool is negative. Skin - oval raised pink skin lesion right forearm.  Large skin tag right Hip area.      Assessment & Plan:  Erectile dysfunction - Plan: sildenafil (VIAGRA) 100 MG tablet.  Discussed  With patient that if he experiences any chest discomfort that he must not take any NTG and  Must tell EMS and ER provider that he has recently taken viagra.  Discussed he needs to Be evaluated by his cardiologist and that he is over due.  If experiencing any untoward SE's DC viagra.  CAD (coronary artery disease) - Discussed that he needs to see cardiology and he is going to call and   Make appointment and if he needs referral will put one in for him.  Other and unspecified hyperlipidemia - Plan: Lipid panel  Routine general medical examination at a health care facility - Plan: POCT CBC, Lipid panel, TSH, PSA, COMPLETE METABOLIC PANEL WITH GFR, Pneumococcal polysaccharide vaccine 23-valent greater than or equal to 2yo subcutaneous/IM.  Discussed he will need colonoscopy and that he need to get pneumonia vaccination.  Agrees to pneumonia vaccination but wants to wait on getting the colonoscopy.  Discussed getting a zostavax immunization, wants to hold, discussed getting eyes checked out and getting seen annually or semi annually by dentist.  Skin lesion - Plan: Ambulatory referral to Dermatology - Asked him to get the right arm skin lesion evaluated and then if Derm wants to they can take off his skin tag on  His right hip and if not follow up and will remove here at clinic.

## 2013-03-30 LAB — PSA: PSA: 0.48 ng/mL (ref <=4.00)

## 2013-07-21 ENCOUNTER — Other Ambulatory Visit: Payer: Self-pay

## 2014-07-28 ENCOUNTER — Other Ambulatory Visit: Payer: Self-pay | Admitting: Family Medicine

## 2014-07-28 NOTE — Telephone Encounter (Signed)
Last refilled 03/29/13 with 11 refills #3

## 2015-04-24 ENCOUNTER — Ambulatory Visit (INDEPENDENT_AMBULATORY_CARE_PROVIDER_SITE_OTHER): Payer: BLUE CROSS/BLUE SHIELD | Admitting: Family

## 2015-04-24 ENCOUNTER — Encounter: Payer: Self-pay | Admitting: Family

## 2015-04-24 VITALS — BP 135/84 | HR 73 | Temp 97.0°F | Ht 67.0 in | Wt 167.0 lb

## 2015-04-24 DIAGNOSIS — L03116 Cellulitis of left lower limb: Secondary | ICD-10-CM

## 2015-04-24 DIAGNOSIS — T798XXA Other early complications of trauma, initial encounter: Secondary | ICD-10-CM

## 2015-04-24 MED ORDER — MUPIROCIN 2 % EX OINT: 1.0000 "application " | TOPICAL_OINTMENT | Freq: Two times a day (BID) | CUTANEOUS | Status: DC

## 2015-04-24 MED ORDER — CEPHALEXIN 500 MG PO CAPS: 500.0000 mg | ORAL_CAPSULE | Freq: Three times a day (TID) | ORAL | Status: DC

## 2015-04-24 NOTE — Patient Instructions (Signed)

## 2015-04-24 NOTE — Progress Notes (Signed)
   Subjective:    Patient ID: [name] [name]., male    DOB: [birth year], 69 y.o.   MRN: [id number]  HPI PT presents to the office for a wound on his left shin. Pt states he noticed it last week, but it has become worse. Pt states he "scraped in on some metal". Pt states the spot has become bigger. Pt states the wound is red. Pt states he had an old rx of bactroban and applied that to the wound and states it helped greatly.   Review of Systems  Constitutional: Negative.   HENT: Negative.   Respiratory: Negative.   Cardiovascular: Negative.   Gastrointestinal: Negative.   Endocrine: Negative.   Genitourinary: Negative.   Musculoskeletal: Negative.   Neurological: Negative.   Hematological: Negative.   Psychiatric/Behavioral: Negative.   All other systems reviewed and are negative.      Objective:   Physical Exam  Constitutional: He is oriented to person, place, and time. He appears well-developed and well-nourished. No distress.  HENT:  Head: Normocephalic.  Eyes: Pupils are equal, round, and reactive to light. Right eye exhibits no discharge. Left eye exhibits no discharge.  Neck: Normal range of motion. Neck supple. No thyromegaly present.  Cardiovascular: Normal rate, regular rhythm, normal heart sounds and intact distal pulses.   No murmur heard. Pulmonary/Chest: Effort normal and breath sounds normal. No respiratory distress. He has no wheezes.  Abdominal: Soft. Bowel sounds are normal. He exhibits no distension. There is no tenderness.  Musculoskeletal: Normal range of motion. He exhibits no edema or tenderness.  Neurological: He is alert and oriented to person, place, and time. He has normal reflexes. No cranial nerve deficit.  Skin: Skin is warm and dry. Rash noted. There is erythema (4CmX3CM circular erythemas ).  Psychiatric: He has a normal mood and affect. His behavior is normal. Judgment and thought content normal.  Vitals reviewed.   BP 135/84 mmHg  Pulse 73   Temp(Src) 97 F (36.1 C) (Oral)  Ht  (1.702 m)  Wt 167 lb (75.751 kg)  BMI 26.15 kg/m2       Assessment & Plan:  1. Infected wound, initial encounter - cephALEXin (KEFLEX) 500 MG capsule; Take 1 capsule (500 mg total) by mouth 3 (three) times daily.  Dispense: 21 capsule; Refill: 0 - mupirocin ointment (BACTROBAN) 2 %; Apply 1 application topically 2 (two) times daily.  Dispense: 22 g; Refill: 0  2. Cellulitis of leg, left - cephALEXin (KEFLEX) 500 MG capsule; Take 1 capsule (500 mg total) by mouth 3 (three) times daily.  Dispense: 21 capsule; Refill: 0 - mupirocin ointment (BACTROBAN) 2 %; Apply 1 application topically 2 (two) times daily.  Dispense: 22 g; Refill: 0  Keep wound clean and dry S/S of infection discussed-RTO for any new drainage, redness, or fever RTO in 2 weeks  Jannifer Rodney, FNP

## 2015-11-30 ENCOUNTER — Other Ambulatory Visit: Payer: Self-pay | Admitting: Family Medicine

## 2016-01-07 ENCOUNTER — Ambulatory Visit (INDEPENDENT_AMBULATORY_CARE_PROVIDER_SITE_OTHER): Payer: BLUE CROSS/BLUE SHIELD | Admitting: Family Medicine

## 2016-01-07 ENCOUNTER — Encounter: Payer: Self-pay | Admitting: Family Medicine

## 2016-01-07 VITALS — BP 138/90 | HR 65 | Temp 97.1°F | Ht 67.0 in | Wt 168.0 lb

## 2016-01-07 DIAGNOSIS — J309 Allergic rhinitis, unspecified: Secondary | ICD-10-CM

## 2016-01-07 MED ORDER — SILDENAFIL CITRATE 100 MG PO TABS: ORAL_TABLET | ORAL | Status: DC

## 2016-01-07 MED ORDER — MOMETASONE FUROATE 50 MCG/ACT NA SUSP: 2.0000 | Freq: Every day | NASAL | Status: DC

## 2016-01-07 NOTE — Progress Notes (Signed)
BP 138/90 mmHg  Pulse 65  Temp(Src) 97.1 F (36.2 C) (Oral)  Ht 5\' 7"  (1.702 m)  Wt 168 lb (76.204 kg)  BMI 26.31 kg/m2   Subjective:    Patient ID: [name] BloodEugene J Baynes Jr., male    DOB: 03-09-1946, 70 y.o.   MRN: [id number]  HPI: [name] Bloodugene J Spease Jr. is a 70 y.o. male presenting on 01/07/2016 for Cough; Left ear popping and feels full; and Medication Refill   HPI Cough and left ear popping and congestion Patient has been having cough and congestion and postnasal drainage is left ear is been popping for the past few weeks. He denies any fevers or chills or shortness of breath or wheezing. He does admit that he gets seasonal allergies this time year. He is not currently on any kind of medication for allergies and does not like to take medications regularly. He denies any sick contacts that he knows of. There is no drainage out of that ear.  Relevant past medical, surgical, family and social history reviewed and updated as indicated. Interim medical history since our last visit reviewed. Allergies and medications reviewed and updated.  Review of Systems  Constitutional: Negative for fever and chills.  HENT: Positive for congestion, ear pain, postnasal drip, rhinorrhea, sinus pressure, sneezing and sore throat. Negative for ear discharge and voice change.   Eyes: Negative for pain, discharge, redness and visual disturbance.  Respiratory: Positive for cough. Negative for chest tightness, shortness of breath and wheezing.   Cardiovascular: Negative for chest pain and leg swelling.  Gastrointestinal: Negative for abdominal pain, diarrhea and constipation.  Genitourinary: Negative for difficulty urinating.  Musculoskeletal: Negative for back pain and gait problem.  Skin: Negative for rash.  Neurological: Negative for syncope, light-headedness and headaches.  All other systems reviewed and are negative.   Per HPI unless specifically indicated above     Medication List       This list  is accurate as of: 01/07/16 12:41 PM.  Always use your most recent med list.               aspirin 81 MG chewable tablet  Chew 81 mg by mouth daily.     mometasone 50 MCG/ACT nasal spray  Commonly known as:  NASONEX  Place 2 sprays into the nose daily.     mupirocin ointment 2 %  Commonly known as:  BACTROBAN  Apply 1 application topically 2 (two) times daily.     sildenafil 100 MG tablet  Commonly known as:  VIAGRA  TAKE AS DIRECTED.           Objective:    BP 138/90 mmHg  Pulse 65  Temp(Src) 97.1 F (36.2 C) (Oral)  Ht 5\' 7"  (1.702 m)  Wt 168 lb (76.204 kg)  BMI 26.31 kg/m2  Wt Readings from Last 3 Encounters:  01/07/16 168 lb (76.204 kg)  04/24/15 167 lb (75.751 kg)  03/29/13 152 lb (68.947 kg)    Physical Exam  Constitutional: He is oriented to person, place, and time. He appears well-developed and well-nourished. No distress.  HENT:  Right Ear: Tympanic membrane, external ear and ear canal normal.  Left Ear: Tympanic membrane, external ear and ear canal normal.  Nose: Mucosal edema and rhinorrhea present. No sinus tenderness. No epistaxis. Right sinus exhibits no maxillary sinus tenderness and no frontal sinus tenderness. Left sinus exhibits no maxillary sinus tenderness and no frontal sinus tenderness.  Mouth/Throat: Uvula is midline and mucous membranes are normal. Posterior  oropharyngeal edema present. No oropharyngeal exudate, posterior oropharyngeal erythema or tonsillar abscesses.  Eyes: Conjunctivae and EOM are normal. Pupils are equal, round, and reactive to light. Right eye exhibits no discharge. No scleral icterus.  Neck: Neck supple. No thyromegaly present.  Cardiovascular: Normal rate, regular rhythm, normal heart sounds and intact distal pulses.   No murmur heard. Pulmonary/Chest: Effort normal and breath sounds normal. No respiratory distress. He has no wheezes. He has no rales.  Musculoskeletal: Normal range of motion. He exhibits no edema.    Lymphadenopathy:    He has no cervical adenopathy.  Neurological: He is alert and oriented to person, place, and time. Coordination normal.  Skin: Skin is warm and dry. No rash noted. He is not diaphoretic.  Psychiatric: He has a normal mood and affect. His behavior is normal.  Nursing note and vitals reviewed.      Assessment & Plan:   Problem List Items Addressed This Visit    None    Visit Diagnoses    Allergic rhinitis, unspecified allergic rhinitis type    -  Primary    Use Mucinex, Claritin, Flonase, nasal saline and if not better in 3 or 4 days and give [place] call back.    Relevant Medications    mometasone (NASONEX) 50 MCG/ACT nasal spray        Follow up plan: Return if symptoms worsen or fail to improve.  Counseling provided for all of the vaccine components No orders of the defined types were placed in this encounter.    Arville Care, MD [facility] 01/07/2016, 12:41 PM

## 2016-01-14 ENCOUNTER — Telehealth: Payer: Self-pay

## 2016-01-14 ENCOUNTER — Telehealth: Payer: Self-pay | Admitting: Family Medicine

## 2016-01-14 MED ORDER — AMOXICILLIN-POT CLAVULANATE 875-125 MG PO TABS: 1.0000 | ORAL_TABLET | Freq: Two times a day (BID) | ORAL | Status: DC

## 2016-01-14 NOTE — Telephone Encounter (Signed)
Dr Dettinger said if I ear still hurt he would call in ABX  Please do that

## 2016-01-14 NOTE — Telephone Encounter (Signed)
Augmentin Prescription sent to pharmacy   

## 2016-01-14 NOTE — Telephone Encounter (Signed)
Prescription sent to pharmacy.

## 2016-01-15 NOTE — Telephone Encounter (Signed)
Patient aware.

## 2016-02-06 ENCOUNTER — Encounter: Payer: Self-pay | Admitting: Family Medicine

## 2016-02-06 ENCOUNTER — Ambulatory Visit (INDEPENDENT_AMBULATORY_CARE_PROVIDER_SITE_OTHER): Payer: BLUE CROSS/BLUE SHIELD | Admitting: Family Medicine

## 2016-02-06 VITALS — Temp 97.0°F | Ht 67.0 in | Wt 167.2 lb

## 2016-02-06 DIAGNOSIS — Z8612 Personal history of poliomyelitis: Secondary | ICD-10-CM

## 2016-02-06 DIAGNOSIS — T148 Other injury of unspecified body region: Secondary | ICD-10-CM | POA: Diagnosis not present

## 2016-02-06 DIAGNOSIS — W57XXXA Bitten or stung by nonvenomous insect and other nonvenomous arthropods, initial encounter: Secondary | ICD-10-CM | POA: Diagnosis not present

## 2016-02-06 DIAGNOSIS — R29898 Other symptoms and signs involving the musculoskeletal system: Secondary | ICD-10-CM | POA: Diagnosis not present

## 2016-02-06 DIAGNOSIS — N529 Male erectile dysfunction, unspecified: Secondary | ICD-10-CM | POA: Diagnosis not present

## 2016-02-06 MED ORDER — DOXYCYCLINE HYCLATE 100 MG PO CAPS: 100.0000 mg | ORAL_CAPSULE | Freq: Two times a day (BID) | ORAL | Status: DC

## 2016-02-06 MED ORDER — SILDENAFIL CITRATE 20 MG PO TABS
ORAL_TABLET | ORAL | Status: DC
Start: 2016-02-06 — End: 2016-10-20

## 2016-02-06 NOTE — Progress Notes (Signed)
Subjective:  Patient ID: [name] BloodEugene J Steinhaus Jr., male    DOB: 07-02-1946  Age: 70 y.o. MRN: [id number]  CC: Insect Bite   HPI [name] Bloodugene J Mele Jr. presents for E.D. Just needs it intermittently. [facility] Tick bite 6 days ago. Thinks it may have been there a couple of days. Located at left flank area. Still red.Tick was a tiny deer tick. No fever, Nir rash other than the redness at the site.  Chronic weakness of RLE. Had Polio in 1948. Right leg smaller. Occasionally gives way and he falls. Manages pretty well unless he has to carry objects and/or walk over 200 feet. Needs handicap sticker.   History [name] [name] has a past medical history of Hyperlipidemia; Myocardial infarction (HCC); and Polio.   He has past surgical history that includes Coronary stent placement.   His family history includes Diabetes in his mother; Heart disease in his father and mother.He reports that he has quit smoking. He has never used smokeless tobacco. He reports that he drinks about 1.2 oz of alcohol per week. He reports that he does not use illicit drugs.    ROS Review of Systems  Constitutional: Negative for fever, chills and diaphoresis.  HENT: Negative for rhinorrhea and sore throat.   Respiratory: Negative for cough and shortness of breath.   Cardiovascular: Negative for chest pain.  Gastrointestinal: Negative for abdominal pain.  Musculoskeletal: Positive for arthralgias. Negative for myalgias.  Skin: Positive for wound (see history of present illness). Negative for rash.  Neurological: Positive for weakness (see history resent illness). Negative for headaches.    Objective:  Temp(Src) 97 F (36.1 C) (Oral)  Ht 5\' 7"  (1.702 m)  Wt 167 lb 3.2 oz (75.841 kg)  BMI 26.18 kg/m2  BP Readings from Last 3 Encounters:  01/07/16 138/90  04/24/15 135/84  03/29/13 126/79    Wt Readings from Last 3 Encounters:  02/06/16 167 lb 3.2 oz (75.841 kg)  01/07/16 168 lb (76.204 kg)  04/24/15  167 lb (75.751 kg)     Physical Exam  Constitutional: He appears well-developed and well-nourished.  HENT:  Head: Normocephalic and atraumatic.  Right Ear: Tympanic membrane and external ear normal. No decreased hearing is noted.  Left Ear: Tympanic membrane and external ear normal. No decreased hearing is noted.  Mouth/Throat: No oropharyngeal exudate or posterior oropharyngeal erythema.  Eyes: Pupils are equal, round, and reactive to light.  Neck: Normal range of motion. Neck supple.  Cardiovascular: Normal rate and regular rhythm.   No murmur heard. Pulmonary/Chest: Breath sounds normal. No respiratory distress.  Abdominal: Soft. Bowel sounds are normal. He exhibits no mass. There is no tenderness.  Skin: Skin is warm and dry.  There are 2 lesions side-by-side measuring 5 mm each at the mid axillary line at the level of the iliac crest on the left. Each of these is erythematous with a tiny central ulceration that is crusted. There are no other rashes.  Vitals reviewed.    Lab Results  Component Value Date   WBC 7.1 03/29/2013   HGB 16.1 03/29/2013   HCT 45.3 03/29/2013   PLT 166 08/10/2008   GLUCOSE 85 03/29/2013   CHOL 166 03/29/2013   TRIG 103 03/29/2013   HDL 52 03/29/2013   LDLCALC 93 03/29/2013   ALT 20 03/29/2013   AST 20 03/29/2013   NA 141 03/29/2013   K 5.0 03/29/2013   CL 106 03/29/2013   CREATININE 0.93 03/29/2013   BUN 15 03/29/2013  CO2 28 03/29/2013   TSH 1.027 03/29/2013   PSA 0.48 03/29/2013   INR 2.9* 08/08/2008    No results found.  Assessment & Plan:   Tiffany was seen today for insect bite.  Diagnoses and all orders for this visit:  Erectile dysfunction, unspecified erectile dysfunction type  Tick bite  Weakness of right leg  History of poliomyelitis  Other orders -     doxycycline (VIBRAMYCIN) 100 MG capsule; Take 1 capsule (100 mg total) by mouth 2 (two) times daily. -     sildenafil (REVATIO) 20 MG tablet; Use from 2-5 tablets  daily as needed  Handicap sticker application signed on pt.s behalf.  I have discontinued Mr. Devincent's amoxicillin-clavulanate. I am also having him start on doxycycline and sildenafil. Additionally, I am having him maintain his aspirin, mupirocin ointment, mometasone, and sildenafil.  Meds ordered this encounter  Medications  . doxycycline (VIBRAMYCIN) 100 MG capsule    Sig: Take 1 capsule (100 mg total) by mouth 2 (two) times daily.    Dispense:  10 capsule    Refill:  0  . sildenafil (REVATIO) 20 MG tablet    Sig: Use from 2-5 tablets daily as needed    Dispense:  50 tablet    Refill:  5     Follow-up: Return if symptoms worsen or fail to improve.  Mechele Claude, M.D.

## 2016-09-16 ENCOUNTER — Emergency Department (HOSPITAL_BASED_OUTPATIENT_CLINIC_OR_DEPARTMENT_OTHER)
Admission: EM | Admit: 2016-09-16 | Discharge: 2016-09-16 | Disposition: A | Discharge status: Home / Self Care | Payer: BLUE CROSS/BLUE SHIELD | Attending: Emergency Medicine | Admitting: Emergency Medicine

## 2016-09-16 ENCOUNTER — Encounter (HOSPITAL_BASED_OUTPATIENT_CLINIC_OR_DEPARTMENT_OTHER): Payer: Self-pay | Admitting: Emergency Medicine

## 2016-09-16 ENCOUNTER — Emergency Department (HOSPITAL_BASED_OUTPATIENT_CLINIC_OR_DEPARTMENT_OTHER): Payer: BLUE CROSS/BLUE SHIELD

## 2016-09-16 DIAGNOSIS — S8012XA Contusion of left lower leg, initial encounter: Secondary | ICD-10-CM | POA: Insufficient documentation

## 2016-09-16 DIAGNOSIS — Y999 Unspecified external cause status: Secondary | ICD-10-CM | POA: Diagnosis not present

## 2016-09-16 DIAGNOSIS — Y92410 Unspecified street and highway as the place of occurrence of the external cause: Secondary | ICD-10-CM | POA: Insufficient documentation

## 2016-09-16 DIAGNOSIS — Z955 Presence of coronary angioplasty implant and graft: Secondary | ICD-10-CM | POA: Insufficient documentation

## 2016-09-16 DIAGNOSIS — Z87891 Personal history of nicotine dependence: Secondary | ICD-10-CM | POA: Insufficient documentation

## 2016-09-16 DIAGNOSIS — S5002XA Contusion of left elbow, initial encounter: Secondary | ICD-10-CM | POA: Diagnosis not present

## 2016-09-16 DIAGNOSIS — Y9389 Activity, other specified: Secondary | ICD-10-CM | POA: Insufficient documentation

## 2016-09-16 DIAGNOSIS — S50312A Abrasion of left elbow, initial encounter: Secondary | ICD-10-CM | POA: Diagnosis not present

## 2016-09-16 DIAGNOSIS — I2510 Atherosclerotic heart disease of native coronary artery without angina pectoris: Secondary | ICD-10-CM | POA: Diagnosis not present

## 2016-09-16 DIAGNOSIS — T148XXA Other injury of unspecified body region, initial encounter: Secondary | ICD-10-CM

## 2016-09-16 DIAGNOSIS — S59902A Unspecified injury of left elbow, initial encounter: Secondary | ICD-10-CM | POA: Diagnosis present

## 2016-09-16 DIAGNOSIS — T07XXXA Unspecified multiple injuries, initial encounter: Secondary | ICD-10-CM

## 2016-09-16 MED ORDER — TRAMADOL HCL 50 MG PO TABS: 50.0000 mg | ORAL_TABLET | Freq: Four times a day (QID) | ORAL | 0 refills | Status: DC | PRN

## 2016-09-16 MED FILL — traMADol HCL 50 MG TABS: 50 | 4 days supply | Qty: 15 | Fill #0
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
## 2016-09-16 NOTE — ED Notes (Signed)
Patient transported to X-ray 

## 2016-09-16 NOTE — ED Notes (Signed)
ED Provider at bedside. 

## 2016-09-16 NOTE — ED Notes (Signed)
ED Provider at bedside discussing test results and dispo plan of care. 
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
## 2016-09-17 ENCOUNTER — Ambulatory Visit (INDEPENDENT_AMBULATORY_CARE_PROVIDER_SITE_OTHER): Payer: BLUE CROSS/BLUE SHIELD | Admitting: Family Medicine

## 2016-09-17 ENCOUNTER — Encounter: Payer: Self-pay | Admitting: Family Medicine

## 2016-09-17 ENCOUNTER — Ambulatory Visit (INDEPENDENT_AMBULATORY_CARE_PROVIDER_SITE_OTHER): Payer: BLUE CROSS/BLUE SHIELD

## 2016-09-17 VITALS — BP 129/79 | HR 78 | Temp 98.6°F | Ht 67.0 in | Wt 172.0 lb

## 2016-09-17 DIAGNOSIS — M542 Cervicalgia: Secondary | ICD-10-CM

## 2016-09-17 DIAGNOSIS — M545 Low back pain, unspecified: Secondary | ICD-10-CM

## 2016-09-17 MED ORDER — DICLOFENAC SODIUM 75 MG PO TBEC: 75.0000 mg | DELAYED_RELEASE_TABLET | Freq: Two times a day (BID) | ORAL | 2 refills | Status: DC

## 2016-09-17 MED ORDER — CYCLOBENZAPRINE HCL 10 MG PO TABS: 10.0000 mg | ORAL_TABLET | Freq: Three times a day (TID) | ORAL | 1 refills | Status: DC | PRN

## 2016-09-17 NOTE — Patient Instructions (Signed)
Degenerative Disk Disease Introduction Degenerative disk disease is a condition caused by the changes that occur in spinal disks as you grow older. Spinal disks are soft and compressible disks located between the bones of your spine (vertebrae). These disks act like shock absorbers. Degenerative disk disease can affect the whole spine. However, the neck and lower back are most commonly affected. Many changes can occur in the spinal disks with aging, such as:  The spinal disks may dry and shrink.  Small tears may occur in the tough, outer covering of the disk (annulus).  The disk space may become smaller due to loss of water.  Abnormal growths in the bone (spurs) may occur. This can put pressure on the nerve roots exiting the spinal canal, causing pain.  The spinal canal may become narrowed. What increases the risk?  Being overweight.  Having a family history of degenerative disk disease.  Smoking.  There is increased risk if you are doing heavy lifting or have a sudden injury. What are the signs or symptoms? Symptoms vary from person to person and may include:  Pain that varies in intensity. Some people have no pain, while others have severe pain. The location of the pain depends on the part of your backbone that is affected.  You will have neck or arm pain if a disk in the neck area is affected.  You will have pain in your back, buttocks, or legs if a disk in the lower back is affected.  Pain that becomes worse while bending, reaching up, or with twisting movements.  Pain that may start gradually and then get worse as time passes. It may also start after a major or minor injury.  Numbness or tingling in the arms or legs. How is this diagnosed? Your health care provider will ask you about your symptoms and about activities or habits that may cause the pain. He or she may also ask about any injuries, diseases, or treatments you have had. Your health care provider will examine you  to check for the range of movement that is possible in the affected area, to check for strength in your extremities, and to check for sensation in the areas of the arms and legs supplied by different nerve roots. You may also have:  An X-ray of the spine.  Other imaging tests, such as MRI. How is this treated? Your health care provider will advise you on the best plan for treatment. Treatment may include:  Medicines.  Rehabilitation exercises. Follow these instructions at home:  Follow proper lifting and walking techniques as advised by your health care provider.  Maintain good posture.  Exercise regularly as advised by your health care provider.  Perform relaxation exercises.  Change your sitting, standing, and sleeping habits as advised by your health care provider.  Change positions frequently.  Lose weight or maintain a healthy weight as advised by your health care provider.  Do not use any tobacco products, including cigarettes, chewing tobacco, or electronic cigarettes. If you need help quitting, ask your health care provider.  Wear supportive footwear.  Take medicines only as directed by your health care provider. Contact a health care provider if:  Your pain does not go away within 1-4 weeks.  You have significant appetite or weight loss. Get help right away if:  Your pain is severe.  You notice weakness in your arms, hands, or legs.  You begin to lose control of your bladder or bowel movements.  You have fevers or night  sweats. This information is not intended to replace advice given to you by your health care provider. Make sure you discuss any questions you have with your health care provider. Document Released: 06/29/2007 Document Revised: 02/07/2016 Document Reviewed: 01/03/2014  2017 Else[employer] Back Exercises The following exercises strengthen the muscles that help to support the back. They also help to keep the lower back flexible. Doing these exercises  can help to prevent back pain or lessen existing pain. If you have back pain or discomfort, try doing these exercises 2-3 times each day or as told by your health care provider. When the pain goes away, do them once each day, but increase the number of times that you repeat the steps for each exercise (do more repetitions). If you do not have back pain or discomfort, do these exercises once each day or as told by your health care provider. Exercises Single Knee to Chest  Repeat these steps 3-5 times for each leg: 1. Lie on your back on a firm bed or the floor with your legs extended. 2. Bring one knee to your chest. Your other leg should stay extended and in contact with the floor. 3. Hold your knee in place by grabbing your knee or thigh. 4. Pull on your knee until you feel a gentle stretch in your lower back. 5. Hold the stretch for 10-30 seconds. 6. Slowly release and straighten your leg. Pelvic Tilt  Repeat these steps 5-10 times: 1. Lie on your back on a firm bed or the floor with your legs extended. 2. Bend your knees so they are pointing toward the ceiling and your feet are flat on the floor. 3. Tighten your lower abdominal muscles to press your lower back against the floor. This motion will tilt your pelvis so your tailbone points up toward the ceiling instead of pointing to your feet or the floor. 4. With gentle tension and even breathing, hold this position for 5-10 seconds. Cat-Cow  Repeat these steps until your lower back becomes more flexible: 1. Get into a hands-and-knees position on a firm surface. Keep your hands under your shoulders, and keep your knees under your hips. You may place padding under your knees for comfort. 2. Let your head hang down, and point your tailbone toward the floor so your lower back becomes rounded like the back of a cat. 3. Hold this position for 5 seconds. 4. Slowly lift your head and point your tailbone up toward the ceiling so your back forms a  sagging arch like the back of a cow. 5. Hold this position for 5 seconds. Press-Ups  Repeat these steps 5-10 times: 1. Lie on your abdomen (face-down) on the floor. 2. Place your palms near your head, about shoulder-width apart. 3. While you keep your back as relaxed as possible and keep your hips on the floor, slowly straighten your arms to raise the top half of your body and lift your shoulders. Do not use your back muscles to raise your upper torso. You may adjust the placement of your hands to make yourself more comfortable. 4. Hold this position for 5 seconds while you keep your back relaxed. 5. Slowly return to lying flat on the floor. Bridges  Repeat these steps 10 times: 1. Lie on your back on a firm surface. 2. Bend your knees so they are pointing toward the ceiling and your feet are flat on the floor. 3. Tighten your buttocks muscles and lift your buttocks off of the floor until your  waist is at almost the same height as your knees. You should feel the muscles working in your buttocks and the back of your thighs. If you do not feel these muscles, slide your feet 1-2 inches farther away from your buttocks. 4. Hold this position for 3-5 seconds. 5. Slowly lower your hips to the starting position, and allow your buttocks muscles to relax completely. If this exercise is too easy, try doing it with your arms crossed over your chest. Abdominal Crunches  Repeat these steps 5-10 times: 1. Lie on your back on a firm bed or the floor with your legs extended. 2. Bend your knees so they are pointing toward the ceiling and your feet are flat on the floor. 3. Cross your arms over your chest. 4. Tip your chin slightly toward your chest without bending your neck. 5. Tighten your abdominal muscles and slowly raise your trunk (torso) high enough to lift your shoulder blades a tiny bit off of the floor. Avoid raising your torso higher than that, because it can put too much stress on your low back and  it does not help to strengthen your abdominal muscles. 6. Slowly return to your starting position. Back Lifts  Repeat these steps 5-10 times: 1. Lie on your abdomen (face-down) with your arms at your sides, and rest your forehead on the floor. 2. Tighten the muscles in your legs and your buttocks. 3. Slowly lift your chest off of the floor while you keep your hips pressed to the floor. Keep the back of your head in line with the curve in your back. Your eyes should be looking at the floor. 4. Hold this position for 3-5 seconds. 5. Slowly return to your starting position. Contact a health care provider if:  Your back pain or discomfort gets much worse when you do an exercise.  Your back pain or discomfort does not lessen within 2 hours after you exercise. If you have any of these problems, stop doing these exercises right away. Do not do them again unless your health care provider says that you can. Get help right away if:  You develop sudden, severe back pain. If this happens, stop doing the exercises right away. Do not do them again unless your health care provider says that you can. This information is not intended to replace advice given to you by your health care provider. Make sure you discuss any questions you have with your health care provider. Document Released: 10/09/2004 Document Revised: 01/09/2016 Document Reviewed: 10/26/2014 Elsevier Interactive Patient Education  2017 [employer]
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
## 2016-09-23 DIAGNOSIS — Z029 Encounter for administrative examinations, unspecified: Secondary | ICD-10-CM

## 2016-10-01 ENCOUNTER — Ambulatory Visit: Payer: BLUE CROSS/BLUE SHIELD | Admitting: Family Medicine

## 2016-10-06 ENCOUNTER — Encounter: Payer: Self-pay | Admitting: Family Medicine

## 2016-10-06 ENCOUNTER — Ambulatory Visit (INDEPENDENT_AMBULATORY_CARE_PROVIDER_SITE_OTHER): Payer: BLUE CROSS/BLUE SHIELD

## 2016-10-06 ENCOUNTER — Ambulatory Visit (INDEPENDENT_AMBULATORY_CARE_PROVIDER_SITE_OTHER): Payer: BLUE CROSS/BLUE SHIELD | Admitting: Family Medicine

## 2016-10-06 VITALS — BP 136/78 | HR 65 | Temp 96.7°F | Ht 67.0 in | Wt 171.0 lb

## 2016-10-06 DIAGNOSIS — S60222A Contusion of left hand, initial encounter: Secondary | ICD-10-CM | POA: Diagnosis not present

## 2016-10-06 DIAGNOSIS — M25512 Pain in left shoulder: Secondary | ICD-10-CM | POA: Diagnosis not present

## 2016-10-06 DIAGNOSIS — S60222D Contusion of left hand, subsequent encounter: Secondary | ICD-10-CM
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
## 2016-10-07 ENCOUNTER — Telehealth: Payer: Self-pay | Admitting: Family

## 2016-10-07 NOTE — Telephone Encounter (Signed)
Please  write and I will sign. Thanks, WS 

## 2016-10-07 NOTE — Telephone Encounter (Signed)
Letter printed and left up front for pt, he is aware.

## 2016-10-15 ENCOUNTER — Ambulatory Visit: Payer: BLUE CROSS/BLUE SHIELD | Attending: Family Medicine | Admitting: Physical Therapy

## 2016-10-15 DIAGNOSIS — M542 Cervicalgia: Secondary | ICD-10-CM | POA: Insufficient documentation

## 2016-10-15 DIAGNOSIS — M6281 Muscle weakness (generalized): Secondary | ICD-10-CM | POA: Diagnosis present

## 2016-10-15 DIAGNOSIS — M25512 Pain in left shoulder: Secondary | ICD-10-CM

## 2016-10-15 DIAGNOSIS — M25522 Pain in left elbow: Secondary | ICD-10-CM | POA: Insufficient documentation

## 2016-10-15 DIAGNOSIS — M545 Low back pain, unspecified: Secondary | ICD-10-CM

## 2016-10-15 DIAGNOSIS — M25612 Stiffness of left shoulder, not elsewhere classified: Secondary | ICD-10-CM | POA: Insufficient documentation

## 2016-10-15 DIAGNOSIS — M79645 Pain in left finger(s): Secondary | ICD-10-CM | POA: Diagnosis present
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
## 2016-10-20 ENCOUNTER — Encounter: Payer: Self-pay | Admitting: Physical Therapy

## 2016-10-20 ENCOUNTER — Ambulatory Visit (INDEPENDENT_AMBULATORY_CARE_PROVIDER_SITE_OTHER): Payer: Medicare Other | Admitting: Family Medicine

## 2016-10-20 ENCOUNTER — Encounter: Payer: Self-pay | Admitting: Family Medicine

## 2016-10-20 ENCOUNTER — Ambulatory Visit: Payer: BLUE CROSS/BLUE SHIELD | Attending: Family Medicine | Admitting: Physical Therapy

## 2016-10-20 VITALS — BP 135/77 | HR 74 | Temp 96.8°F | Ht 67.0 in | Wt 174.0 lb

## 2016-10-20 DIAGNOSIS — M6281 Muscle weakness (generalized): Secondary | ICD-10-CM

## 2016-10-20 DIAGNOSIS — M25612 Stiffness of left shoulder, not elsewhere classified: Secondary | ICD-10-CM | POA: Diagnosis present

## 2016-10-20 DIAGNOSIS — S60222D Contusion of left hand, subsequent encounter: Secondary | ICD-10-CM | POA: Diagnosis not present

## 2016-10-20 DIAGNOSIS — M79645 Pain in left finger(s): Secondary | ICD-10-CM | POA: Insufficient documentation

## 2016-10-20 DIAGNOSIS — M545 Low back pain, unspecified: Secondary | ICD-10-CM

## 2016-10-20 DIAGNOSIS — M25522 Pain in left elbow: Secondary | ICD-10-CM

## 2016-10-20 DIAGNOSIS — M25512 Pain in left shoulder: Secondary | ICD-10-CM | POA: Insufficient documentation

## 2016-10-20 DIAGNOSIS — M542 Cervicalgia: Secondary | ICD-10-CM
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
## 2016-10-23 ENCOUNTER — Encounter: Payer: Medicare Other | Admitting: Physical Therapy

## 2016-10-28 DIAGNOSIS — S40012A Contusion of left shoulder, initial encounter: Secondary | ICD-10-CM | POA: Diagnosis not present

## 2016-10-28 DIAGNOSIS — R202 Paresthesia of skin: Secondary | ICD-10-CM | POA: Diagnosis not present

## 2016-10-28 DIAGNOSIS — M47812 Spondylosis without myelopathy or radiculopathy, cervical region: Secondary | ICD-10-CM | POA: Diagnosis not present

## 2016-10-28 DIAGNOSIS — S139XXA Sprain of joints and ligaments of unspecified parts of neck, initial encounter: Secondary | ICD-10-CM | POA: Diagnosis not present

## 2016-10-30 ENCOUNTER — Encounter: Payer: Medicare Other | Admitting: Physical Therapy

## 2016-11-20 DIAGNOSIS — M47812 Spondylosis without myelopathy or radiculopathy, cervical region: Secondary | ICD-10-CM | POA: Diagnosis not present

## 2016-11-20 DIAGNOSIS — R202 Paresthesia of skin: Secondary | ICD-10-CM | POA: Diagnosis not present

## 2016-11-20 DIAGNOSIS — G5622 Lesion of ulnar nerve, left upper limb: Secondary | ICD-10-CM | POA: Diagnosis not present

## 2016-11-20 DIAGNOSIS — S40012D Contusion of left shoulder, subsequent encounter: Secondary | ICD-10-CM | POA: Diagnosis not present

## 2016-12-02 DIAGNOSIS — M47812 Spondylosis without myelopathy or radiculopathy, cervical region: Secondary | ICD-10-CM | POA: Diagnosis not present

## 2016-12-05 DIAGNOSIS — R202 Paresthesia of skin: Secondary | ICD-10-CM | POA: Diagnosis not present

## 2016-12-05 DIAGNOSIS — M47812 Spondylosis without myelopathy or radiculopathy, cervical region: Secondary | ICD-10-CM | POA: Diagnosis not present

## 2016-12-05 DIAGNOSIS — S40012D Contusion of left shoulder, subsequent encounter: Secondary | ICD-10-CM | POA: Diagnosis not present

## 2016-12-05 DIAGNOSIS — M4802 Spinal stenosis, cervical region: Secondary | ICD-10-CM | POA: Diagnosis not present

## 2016-12-08 DIAGNOSIS — M4802 Spinal stenosis, cervical region: Secondary | ICD-10-CM | POA: Diagnosis not present

## 2016-12-08 DIAGNOSIS — M47812 Spondylosis without myelopathy or radiculopathy, cervical region: Secondary | ICD-10-CM | POA: Diagnosis not present

## 2016-12-08 DIAGNOSIS — G5622 Lesion of ulnar nerve, left upper limb: Secondary | ICD-10-CM | POA: Diagnosis not present

## 2016-12-26 DIAGNOSIS — M5416 Radiculopathy, lumbar region: Secondary | ICD-10-CM | POA: Diagnosis not present

## 2016-12-26 DIAGNOSIS — M50822 Other cervical disc disorders at C5-C6 level: Secondary | ICD-10-CM | POA: Diagnosis not present

## 2016-12-26 DIAGNOSIS — M50823 Other cervical disc disorders at C6-C7 level: Secondary | ICD-10-CM | POA: Diagnosis not present

## 2016-12-26 DIAGNOSIS — M50821 Other cervical disc disorders at C4-C5 level: Secondary | ICD-10-CM | POA: Diagnosis not present

## 2017-01-02 ENCOUNTER — Other Ambulatory Visit: Payer: Medicare Other

## 2017-01-05 DIAGNOSIS — G5622 Lesion of ulnar nerve, left upper limb: Secondary | ICD-10-CM | POA: Diagnosis not present

## 2017-01-05 DIAGNOSIS — M19042 Primary osteoarthritis, left hand: Secondary | ICD-10-CM | POA: Diagnosis not present

## 2017-01-05 DIAGNOSIS — M1812 Unilateral primary osteoarthritis of first carpometacarpal joint, left hand: Secondary | ICD-10-CM | POA: Diagnosis not present

## 2017-01-07 DIAGNOSIS — M5416 Radiculopathy, lumbar region: Secondary | ICD-10-CM | POA: Diagnosis not present

## 2017-01-12 DIAGNOSIS — M5136 Other intervertebral disc degeneration, lumbar region: Secondary | ICD-10-CM | POA: Diagnosis not present

## 2017-01-12 DIAGNOSIS — M5030 Other cervical disc degeneration, unspecified cervical region: Secondary | ICD-10-CM | POA: Diagnosis not present

## 2017-01-12 DIAGNOSIS — M5416 Radiculopathy, lumbar region: Secondary | ICD-10-CM | POA: Diagnosis not present

## 2017-01-12 DIAGNOSIS — S134XXS Sprain of ligaments of cervical spine, sequela: Secondary | ICD-10-CM | POA: Diagnosis not present

## 2017-01-12 DIAGNOSIS — M4802 Spinal stenosis, cervical region: Secondary | ICD-10-CM | POA: Diagnosis not present

## 2017-01-12 DIAGNOSIS — M48062 Spinal stenosis, lumbar region with neurogenic claudication: Secondary | ICD-10-CM | POA: Diagnosis not present
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
## 2017-01-19 ENCOUNTER — Telehealth: Payer: Self-pay | Admitting: *Deleted

## 2017-01-19 ENCOUNTER — Encounter: Payer: Self-pay | Admitting: Cardiology

## 2017-01-19 ENCOUNTER — Ambulatory Visit (INDEPENDENT_AMBULATORY_CARE_PROVIDER_SITE_OTHER): Payer: BLUE CROSS/BLUE SHIELD | Admitting: Cardiology

## 2017-01-19 VITALS — BP 140/86 | HR 81 | Ht 67.0 in | Wt 165.5 lb

## 2017-01-19 DIAGNOSIS — I252 Old myocardial infarction: Secondary | ICD-10-CM | POA: Diagnosis not present

## 2017-01-19 DIAGNOSIS — I25119 Atherosclerotic heart disease of native coronary artery with unspecified angina pectoris: Secondary | ICD-10-CM | POA: Diagnosis not present

## 2017-01-19 DIAGNOSIS — E782 Mixed hyperlipidemia: Secondary | ICD-10-CM | POA: Diagnosis not present

## 2017-01-19 DIAGNOSIS — Z01818 Encounter for other preprocedural examination: Secondary | ICD-10-CM | POA: Diagnosis not present

## 2017-01-19 DIAGNOSIS — I2510 Atherosclerotic heart disease of native coronary artery without angina pectoris: Secondary | ICD-10-CM | POA: Diagnosis not present

## 2017-01-19 NOTE — Patient Instructions (Signed)
Medication Instructions:  None Ordered   Labwork: Your physician recommends that you return for lab work in one week for  Fasting BMET, LIPID PANEL  Testing/Procedures: None Ordered   Follow-Up: Your physician recommends that you schedule a follow-up appointment in: 2-3 months with Dr. Elease HashimotoNahser  Any Other Special Instructions Will Be Listed Below (If Applicable).     If you need a refill on your cardiac medications before your next appointment, please call your pharmacy.  Thank you for choosing [facility]  Corrine CMA AAMA

## 2017-01-19 NOTE — Telephone Encounter (Signed)
Patient says he left surgical clearance form at [facility] and got his lab work done.  I explained he must see provider , have chest x-ray and ecg to have a clearance form done.  He seemed confuse and said he would check back with us after his visit with specialist this morning.

## 2017-01-21 ENCOUNTER — Other Ambulatory Visit: Payer: Medicare Other | Admitting: *Deleted

## 2017-01-21 ENCOUNTER — Other Ambulatory Visit: Payer: PRIVATE HEALTH INSURANCE

## 2017-01-21 DIAGNOSIS — I2510 Atherosclerotic heart disease of native coronary artery without angina pectoris: Secondary | ICD-10-CM

## 2017-01-21 DIAGNOSIS — E7800 Pure hypercholesterolemia, unspecified: Secondary | ICD-10-CM

## 2017-01-21 LAB — BASIC METABOLIC PANEL
BUN/Creatinine Ratio: 21 (ref 10–24)
BUN: 17 mg/dL (ref 8–27)
CHLORIDE: 103 mmol/L (ref 96–106)
CO2: 21 mmol/L (ref 18–29)
Calcium: 8.8 mg/dL (ref 8.6–10.2)
Creatinine, Ser: 0.82 mg/dL (ref 0.76–1.27)
GFR calc Af Amer: 103 mL/min/{1.73_m2} (ref >59)
GFR calc non Af Amer: 89 mL/min/{1.73_m2} (ref >59)
GLUCOSE: 99 mg/dL (ref 65–99)
Potassium: 4.3 mmol/L (ref 3.5–5.2)
SODIUM: 140 mmol/L (ref 134–144)

## 2017-01-21 LAB — LIPID PANEL
CHOLESTEROL TOTAL: 169 mg/dL (ref 100–199)
Chol/HDL Ratio: 4.6 ratio (ref 0.0–5.0)
HDL: 37 mg/dL — ABNORMAL LOW (ref >39)
LDL Calculated: 106 mg/dL — ABNORMAL HIGH (ref 0–99)
Triglycerides: 128 mg/dL (ref 0–149)
VLDL CHOLESTEROL CAL: 26 mg/dL (ref 5–40)

## 2017-01-21 NOTE — Addendum Note (Signed)
Addended by: Rithvik Orcutt K on: 01/21/2017 07:32 AM   Modules accepted: Orders  

## 2017-01-21 NOTE — Addendum Note (Signed)
Addended by: Tonita PhoenixBOWDEN, ROBIN K on: 01/21/2017 07:32 AM   Modules accepted: Orders

## 2017-01-22 ENCOUNTER — Telehealth: Payer: Self-pay | Admitting: Nurse Practitioner

## 2017-01-22 DIAGNOSIS — M4712 Other spondylosis with myelopathy, cervical region: Secondary | ICD-10-CM | POA: Diagnosis not present

## 2017-01-22 DIAGNOSIS — E782 Mixed hyperlipidemia: Secondary | ICD-10-CM

## 2017-01-22 MED ORDER — ROSUVASTATIN CALCIUM 5 MG PO TABS: 5.0000 mg | ORAL_TABLET | Freq: Every day | ORAL | 3 refills | Status: DC

## 2017-01-22 NOTE — Telephone Encounter (Signed)
-----   Message from Vesta MixerPhilip J Nahser, MD sent at 01/21/2017  4:06 PM EDT ----- He has a hx of CAD .   Goal LDL is 70. Record list atorvastatin as an allergy. Will he consider trying Rosuvastatin 5 mg a day  Check labs in 3 months

## 2017-01-22 NOTE — Telephone Encounter (Signed)
Reviewed results and plan of care with patient who verbalized agreement to start rosuvastatin 5 mg daily. I advised that he will need repeat lab work on day of or prior to August ov with Dr. Elease HashimotoNahser. He verbalized understanding and agreement and states he will call back if he decides to schedule lab work prior to office visit. He thanked me for the call.

## 2017-01-26 ENCOUNTER — Other Ambulatory Visit: Payer: Medicare Other

## 2017-02-16 DIAGNOSIS — G9589 Other specified diseases of spinal cord: Secondary | ICD-10-CM | POA: Diagnosis not present

## 2017-02-16 DIAGNOSIS — M50021 Cervical disc disorder at C4-C5 level with myelopathy: Secondary | ICD-10-CM | POA: Diagnosis not present

## 2017-02-16 DIAGNOSIS — M4712 Other spondylosis with myelopathy, cervical region: Secondary | ICD-10-CM | POA: Diagnosis not present

## 2017-02-16 DIAGNOSIS — M47892 Other spondylosis, cervical region: Secondary | ICD-10-CM | POA: Diagnosis not present

## 2017-03-09 ENCOUNTER — Telehealth: Payer: Self-pay | Admitting: Family

## 2017-03-09 ENCOUNTER — Other Ambulatory Visit: Payer: Self-pay | Admitting: Family

## 2017-03-09 DIAGNOSIS — L03116 Cellulitis of left lower limb: Secondary | ICD-10-CM

## 2017-03-09 NOTE — Telephone Encounter (Signed)
Pt aware Rx sent to pharmacy 

## 2017-03-09 NOTE — Telephone Encounter (Signed)
I signed the prescription. Please forward to pharmacy if needed.

## 2017-03-11 ENCOUNTER — Encounter: Payer: Self-pay | Admitting: Family Medicine

## 2017-03-11 ENCOUNTER — Ambulatory Visit (INDEPENDENT_AMBULATORY_CARE_PROVIDER_SITE_OTHER): Payer: Medicare Other | Admitting: Family Medicine

## 2017-03-11 ENCOUNTER — Telehealth: Payer: Self-pay | Admitting: Family

## 2017-03-11 VITALS — BP 147/89 | HR 97 | Temp 98.0°F | Wt 161.2 lb

## 2017-03-11 DIAGNOSIS — I2510 Atherosclerotic heart disease of native coronary artery without angina pectoris: Secondary | ICD-10-CM

## 2017-03-11 DIAGNOSIS — E7800 Pure hypercholesterolemia, unspecified: Secondary | ICD-10-CM

## 2017-03-11 DIAGNOSIS — M519 Unspecified thoracic, thoracolumbar and lumbosacral intervertebral disc disorder: Secondary | ICD-10-CM

## 2017-03-11 DIAGNOSIS — N529 Male erectile dysfunction, unspecified: Secondary | ICD-10-CM | POA: Diagnosis not present

## 2017-03-11 DIAGNOSIS — G729 Myopathy, unspecified: Secondary | ICD-10-CM

## 2017-03-11 MED ORDER — SILDENAFIL CITRATE 20 MG PO TABS: 20.0000 mg | ORAL_TABLET | Freq: Every day | ORAL | 5 refills | Status: DC | PRN

## 2017-03-11 MED ORDER — SILDENAFIL CITRATE 20 MG PO TABS
20.0000 mg | ORAL_TABLET | Freq: Every day | ORAL | 5 refills | Status: DC | PRN
Start: 2017-03-11 — End: 2018-05-26

## 2017-03-11 NOTE — Telephone Encounter (Signed)
Prescription sent to pharmacy.

## 2017-03-11 NOTE — Progress Notes (Signed)
Subjective:  Patient ID: [name] Chroman., male    DOB: March 30, 1946  Age: 71 y.o. MRN: [id number]  CC: Medication Refill   HPI [name] [name]. presents for follow-up of elevated cholesterol. Doing well without complaints on current medication. Denies side effects of statin including myalgia and arthralgia and nausea. He is concerned it is inhibiting his erection. He would like a refill of his viagra Currently no chest pain, shortness of breath or other cardiovascular related symptoms noted.He had cervical fusion recently. A little numbness left in the  Left hand dorsally. Otherwise, symptom free. He is having problems with the lumbar spine - fell twice recently. Planning surgery for that as soon as the neck is healed. Meanwhile he is leaving on vacation with his girlfriend in a couple of days and needs the sildenafil refilled. History [name] [name] has a past medical history of Hyperlipidemia; Myocardial infarction ([place]); and Polio.   He has a past surgical history that includes Coronary stent placement and Appendectomy.   His family history includes Diabetes in his mother; Heart disease in his father and mother.He reports that he has quit smoking. He has never used smokeless tobacco. He reports that he drinks about 1.2 oz of alcohol per week . He reports that he does not use drugs.  Current Outpatient Prescriptions on File Prior to Visit  Medication Sig Dispense Refill  . aspirin 81 MG chewable tablet Chew 81 mg by mouth daily.    . mupirocin ointment (BACTROBAN) 2 % Apply 1 application topically 2 (two) times daily. 22 g 5  . rosuvastatin (CRESTOR) 5 MG tablet Take 1 tablet (5 mg total) by mouth daily. 90 tablet 3   No current facility-administered medications on file prior to visit.     ROS Review of Systems  Constitutional: Negative for chills, diaphoresis and fever.  HENT: Negative for rhinorrhea and sore throat.   Respiratory: Negative for cough and shortness of breath.     Cardiovascular: Negative for chest pain.  Gastrointestinal: Negative for abdominal pain.  Musculoskeletal: Positive for back pain and myalgias. Negative for arthralgias and neck pain.  Skin: Negative for rash.  Neurological: Negative for weakness and headaches.    Objective:  BP (!) 147/89   Pulse 97   Temp 98 F (36.7 C) (Oral)   Wt 161 lb 3.2 oz (73.1 kg)   BMI 25.25 kg/m   BP Readings from Last 3 Encounters:  03/11/17 (!) 147/89  01/19/17 140/86  10/20/16 135/77    Wt Readings from Last 3 Encounters:  03/11/17 161 lb 3.2 oz (73.1 kg)  01/19/17 165 lb 8 oz (75.1 kg)  10/20/16 174 lb (78.9 kg)     Physical Exam  Constitutional: He is oriented to person, place, and time. He appears well-developed and well-nourished.  HENT:  Head: Normocephalic and atraumatic.  Right Ear: Tympanic membrane and external ear normal. No decreased hearing is noted.  Left Ear: Tympanic membrane and external ear normal. No decreased hearing is noted.  Mouth/Throat: No oropharyngeal exudate or posterior oropharyngeal erythema.  Eyes: Pupils are equal, round, and reactive to light.  Neck: Normal range of motion. Neck supple.  Cardiovascular: Normal rate and regular rhythm.   No murmur heard. Pulmonary/Chest: Breath sounds normal. No respiratory distress.  Abdominal: Soft. Bowel sounds are normal. He exhibits no mass. There is no tenderness.  Musculoskeletal: Normal range of motion.  Neurological: He is alert and oriented to person, place, and time.  Skin: Skin is warm and dry.  Psychiatric: He has a normal mood and affect.  Vitals reviewed.   No results found for: HGBA1C  Lab Results  Component Value Date   WBC 7.1 03/29/2013   HGB 16.1 03/29/2013   HCT 45.3 03/29/2013   PLT 166 08/10/2008   GLUCOSE 99 01/21/2017   CHOL 169 01/21/2017   TRIG 128 01/21/2017   HDL 37 (L) 01/21/2017   LDLCALC 106 (H) 01/21/2017   ALT 20 03/29/2013   AST 20 03/29/2013   NA 140 01/21/2017   K 4.3  01/21/2017   CL 103 01/21/2017   CREATININE 0.82 01/21/2017   BUN 17 01/21/2017   CO2 21 01/21/2017   TSH 1.027 03/29/2013   PSA 0.48 03/29/2013   INR 2.9 (H) 08/08/2008       Assessment & Plan:   [name] [name] was seen today for medication refill.  Diagnoses and all orders for this visit:  HYPERCHOLESTEROLEMIA  IIA -     CMP14+EGFR -     Lipid panel  Atherosclerosis of native coronary artery of native heart without angina pectoris -     CMP14+EGFR  Erectile dysfunction, unspecified erectile dysfunction type  Lumbar disc disease  Cervical myopathy  Other orders -     sildenafil (REVATIO) 20 MG tablet; Take 1 tablet (20 mg total) by mouth daily as needed (2-5 as needed).   I have discontinued [name] [name]'s sildenafil. I am also having him start on sildenafil. Additionally, I am having him maintain his aspirin, rosuvastatin, and mupirocin ointment.  Meds ordered this encounter  Medications  . DISCONTD: sildenafil (REVATIO) 20 MG tablet    Sig: Take 20 mg by mouth 3 (three) times daily.  . sildenafil (REVATIO) 20 MG tablet    Sig: Take 1 tablet (20 mg total) by mouth daily as needed (2-5 as needed).    Dispense:  50 tablet    Refill:  5     Follow-up: Return in about 6 months (around 09/10/2017).  Claretta Fraise, M.D.

## 2017-03-12 DIAGNOSIS — M4712 Other spondylosis with myelopathy, cervical region: Secondary | ICD-10-CM | POA: Diagnosis not present

## 2017-04-23 DIAGNOSIS — M4712 Other spondylosis with myelopathy, cervical region: Secondary | ICD-10-CM | POA: Diagnosis not present

## 2017-04-23 DIAGNOSIS — M48062 Spinal stenosis, lumbar region with neurogenic claudication: Secondary | ICD-10-CM | POA: Diagnosis not present

## 2017-04-23 DIAGNOSIS — Z6825 Body mass index (BMI) 25.0-25.9, adult: Secondary | ICD-10-CM | POA: Diagnosis not present

## 2017-04-23 DIAGNOSIS — R030 Elevated blood-pressure reading, without diagnosis of hypertension: Secondary | ICD-10-CM | POA: Diagnosis not present

## 2017-05-05 ENCOUNTER — Other Ambulatory Visit: Payer: Self-pay | Admitting: Neurological Surgery

## 2017-05-08 ENCOUNTER — Encounter: Payer: Self-pay | Admitting: Cardiovascular Disease

## 2017-05-08 ENCOUNTER — Ambulatory Visit (INDEPENDENT_AMBULATORY_CARE_PROVIDER_SITE_OTHER): Payer: Medicare Other | Admitting: Cardiovascular Disease

## 2017-05-08 VITALS — BP 142/80 | HR 66 | Ht 67.0 in | Wt 160.0 lb

## 2017-05-08 DIAGNOSIS — I2510 Atherosclerotic heart disease of native coronary artery without angina pectoris: Secondary | ICD-10-CM | POA: Diagnosis not present

## 2017-05-08 DIAGNOSIS — E782 Mixed hyperlipidemia: Secondary | ICD-10-CM | POA: Insufficient documentation

## 2017-05-08 DIAGNOSIS — I25119 Atherosclerotic heart disease of native coronary artery with unspecified angina pectoris: Secondary | ICD-10-CM | POA: Insufficient documentation

## 2017-05-08 MED ORDER — ROSUVASTATIN CALCIUM 10 MG PO TABS: 10.0000 mg | ORAL_TABLET | Freq: Every day | ORAL | 3 refills | Status: DC
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
## 2017-05-08 NOTE — Patient Instructions (Signed)
Medication Instructions:  INCREASE Crestor (Rosuvastatin) to 10 mg daily   Labwork: Your physician recommends that you return for lab work in: 3 months  You will need to FAST for this appointment - nothing to eat or drink after midnight the night before except water.   Testing/Procedures: None Ordered   Follow-Up: Your physician wants you to follow-up in: 6 months with Dr. Elease Hashimoto.  You will receive a reminder letter in the mail two months in advance. If you don't receive a letter, please call our office to schedule the follow-up appointment.   If you need a refill on your cardiac medications before your next appointment, please call your pharmacy.   Thank you for choosing [facility]! Eligha Bridegroom, RN [phone]

## 2017-05-14 ENCOUNTER — Telehealth: Payer: Self-pay | Admitting: Vascular Surgery

## 2017-05-14 NOTE — Telephone Encounter (Signed)
-----   Message from Phillips Odorarol S Pullins, RN sent at 05/12/2017  9:42 AM EDT ----- Regarding: needs appt. with Dr. Ty HiltsEarly Contact: [phone]: [phone] Please schedule for new pt. Consult with Dr. Arbie CookeyEarly, prior to ALIF on 9/24. Please remind the pt. To bring copy of LS spine film to appt.

## 2017-05-14 NOTE — Telephone Encounter (Signed)
Sched appt 06/03/17 at 3:45. Spoke to pt.

## 2017-05-19 ENCOUNTER — Other Ambulatory Visit: Payer: Self-pay

## 2017-05-26 ENCOUNTER — Encounter: Payer: Self-pay | Admitting: Family Medicine

## 2017-05-26 ENCOUNTER — Ambulatory Visit (INDEPENDENT_AMBULATORY_CARE_PROVIDER_SITE_OTHER): Payer: Medicare Other

## 2017-05-26 ENCOUNTER — Ambulatory Visit (INDEPENDENT_AMBULATORY_CARE_PROVIDER_SITE_OTHER): Payer: Medicare Other | Admitting: Family Medicine

## 2017-05-26 VITALS — BP 129/86 | Ht 67.0 in | Wt 162.0 lb

## 2017-05-26 DIAGNOSIS — R0789 Other chest pain: Secondary | ICD-10-CM | POA: Diagnosis not present

## 2017-05-26 DIAGNOSIS — R0602 Shortness of breath: Secondary | ICD-10-CM

## 2017-05-26 DIAGNOSIS — I2510 Atherosclerotic heart disease of native coronary artery without angina pectoris: Secondary | ICD-10-CM

## 2017-05-26 MED ORDER — LEVOFLOXACIN 500 MG PO TABS: 500.0000 mg | ORAL_TABLET | Freq: Every day | ORAL | 0 refills | Status: DC

## 2017-05-26 NOTE — Progress Notes (Signed)
Subjective:  Patient ID: [name] [name]., male    DOB: 04-01-1946  Age: 71 y.o. MRN: [id number]  CC: Cough (pt here today c/o cough and congestion and feels like he is getting pneumonia again.)   HPI [name] [name]. presents for Funny feeling in the upper left chest. It has been accompanied by a cough that feels productive. No expectorated production has been occurring. Onset was 45 days ago. Patient has a back surgery coming up on the 24th and wants to be absolutely sure that he is not coming down with pneumonia that would prevent the surgery from occurring. He is in a lot of pain and it's limiting his activity so he wants to be sure there is no infection. The chest pain sensation lasts a couple of hours at a time. It is a pressure. However it does not radiate. It is specifically not related to exertion. And it is as exacerbated by cough.  Depression screen [facility] 2/9 05/26/2017 03/11/2017 10/20/2016  Decreased Interest 0 0 0  Down, Depressed, Hopeless 0 2 0  PHQ - 2 Score 0 2 0  Altered sleeping - 0 -  Tired, decreased energy - 0 -  Change in appetite - 0 -  Feeling bad or failure about yourself  - 3 -  Trouble concentrating - 3 -  Moving slowly or fidgety/restless - 0 -  Suicidal thoughts - 0 -  PHQ-9 Score - 8 -    History [name] [name] has a past medical history of Hyperlipidemia; Myocardial infarction (HCC); and Polio.   He has a past surgical history that includes Coronary stent placement and Appendectomy.   His family history includes Diabetes in his mother; Heart disease in his father and mother.He reports that he has quit smoking. He has never used smokeless tobacco. He reports that he drinks about 1.2 oz of alcohol per week . He reports that he does not use drugs.    ROS Review of Systems  Constitutional: Negative for chills, diaphoresis, fever and unexpected weight change.  HENT: Negative for congestion, hearing loss, rhinorrhea and sore throat.   Eyes: Negative for visual  disturbance.  Respiratory: Positive for cough, chest tightness and shortness of breath.   Cardiovascular: Positive for chest pain.  Gastrointestinal: Negative for abdominal pain, constipation and diarrhea.  Genitourinary: Negative for dysuria and flank pain.  Musculoskeletal: Negative for arthralgias and joint swelling.  Skin: Negative for rash.  Neurological: Negative for dizziness and headaches.  Psychiatric/Behavioral: Negative for dysphoric mood and sleep disturbance.    Objective:  BP 129/86   Ht  (1.702 m)   Wt 162 lb (73.5 kg)   BMI 25.37 kg/m   BP Readings from Last 3 Encounters:  05/26/17 129/86  05/08/17 (!) 142/80  03/11/17 (!) 147/89    Wt Readings from Last 3 Encounters:  05/26/17 162 lb (73.5 kg)  05/08/17 160 lb (72.6 kg)  03/11/17 161 lb 3.2 oz (73.1 kg)     Physical Exam  Constitutional: He is oriented to person, place, and time. He appears well-developed and well-nourished. No distress.  HENT:  Head: Normocephalic and atraumatic.  Right Ear: External ear normal.  Left Ear: External ear normal.  Nose: Nose normal.  Mouth/Throat: Oropharynx is clear and moist.  Eyes: Pupils are equal, round, and reactive to light. Conjunctivae and EOM are normal.  Neck: Normal range of motion. Neck supple. No thyromegaly present.  Cardiovascular: Normal rate, regular rhythm and normal heart sounds.   No murmur  heard. Pulmonary/Chest: Effort normal and breath sounds normal. No respiratory distress. He has no wheezes. He has no rales.  Abdominal: Soft. Bowel sounds are normal. He exhibits no distension. There is no tenderness.  Lymphadenopathy:    He has no cervical adenopathy.  Neurological: He is alert and oriented to person, place, and time. He has normal reflexes.  Skin: Skin is warm and dry.  Psychiatric: He has a normal mood and affect. His behavior is normal. Judgment and thought content normal.      Assessment & Plan:   [name] [name] was seen today for  cough.  Diagnoses and all orders for this visit:  SOB (shortness of breath) -     DG Chest 2 View; Future -     PR BREATHING CAPACITY TEST  Atypical chest pain  Other orders -     levofloxacin (LEVAQUIN) 500 MG tablet; Take 1 tablet (500 mg total) by mouth daily.       I am having [name] [name] start on levofloxacin. I am also having him maintain his aspirin, mupirocin ointment, sildenafil, and rosuvastatin.  Allergies as of 05/26/2017      Reactions   Lipitor [atorvastatin]       Medication List       Accurate as of 05/26/17  7:27 PM. Always use your most recent med list.          aspirin 81 MG chewable tablet Chew 81 mg by mouth daily.   levofloxacin 500 MG tablet Commonly known as:  LEVAQUIN Take 1 tablet (500 mg total) by mouth daily.   mupirocin ointment 2 % Commonly known as:  BACTROBAN Apply 1 application topically 2 (two) times daily.   rosuvastatin 10 MG tablet Commonly known as:  CRESTOR Take 1 tablet (10 mg total) by mouth daily.   sildenafil 20 MG tablet Commonly known as:  REVATIO Take 1 tablet (20 mg total) by mouth daily as needed (2-5 as needed).            Discharge Care Instructions        Start     Ordered   05/26/17 0000  DG Chest 2 View    Question Answer Comment  Reason for Exam (SYMPTOM  OR DIAGNOSIS REQUIRED) cough and SOB   Preferred imaging location? Internal      05/26/17 1418   05/26/17 0000  PR BREATHING CAPACITY TEST     05/26/17 1500   05/26/17 0000  levofloxacin (LEVAQUIN) 500 MG tablet  Daily     05/26/17 1521       Follow-up: Return in about 3 months (around 08/25/2017).  Mechele ClaudeWarren Kamarah [name], M.D.

## 2017-06-01 ENCOUNTER — Telehealth: Payer: Self-pay | Admitting: Family Medicine

## 2017-06-01 ENCOUNTER — Other Ambulatory Visit: Payer: Self-pay | Admitting: Family Medicine

## 2017-06-01 MED ORDER — LEVOFLOXACIN 500 MG PO TABS: 500.0000 mg | ORAL_TABLET | Freq: Every day | ORAL | 0 refills | Status: DC

## 2017-06-01 NOTE — Addendum Note (Signed)
Addended by: Quay Burow on: 06/01/2017 04:24 PM   Modules accepted: Orders

## 2017-06-01 NOTE — Telephone Encounter (Signed)
Please refill as requested.

## 2017-06-01 NOTE — Telephone Encounter (Signed)
Patient finished last Levaquin this morning.  Feels better but has some lingering congestion and is coughing in the mornings.  He went for pre-op this morning and they recommended he contact you to see if you would call in a few more days of medication to make sure this is all cleared up before surgery.  He is worried about having abdominal surgery and coughing causing more pain.  Using [facility], please advise.

## 2017-06-01 NOTE — Telephone Encounter (Signed)
Pt is having continued congestion and some prod cough Pt is some better but not completely Pt wants refill on Levaquin

## 2017-06-02 NOTE — Pre-Procedure Instructions (Signed)
[name] [name].  06/02/2017      [facility] - [place] - [address] [place] [address] Phone: [phone] Fax: [phone]  [facility] - [place], [place] - [address] [place] [address] Phone: [phone] Fax: [phone]    Your procedure is scheduled on June 08, 2017.  Report to [facility] Admitting at 530 AM.  Call this number if you have problems the morning of surgery:  [phone]   Remember:  Do not eat food or drink liquids after midnight.  Take these medicines the morning of surgery with A SIP OF WATER levofloxacin (levoquin), oxycodone-acetaminophen (percocet)-If needed for pain  7 days prior to surgery STOP taking any Aspirin, Aleve, Naproxen, Ibuprofen, Motrin, Advil, Goody's, BC's, all herbal medications, fish oil, and all vitamins   Do not wear jewelry, make-up or nail polish.  Do not wear lotions, powders, or colognes, or deoderant.  Men may shave face and neck.  Do not bring valuables to the hospital.  [facility] is not responsible for any belongings or valuables.  Contacts, dentures or bridgework may not be worn into surgery.  Leave your suitcase in the car.  After surgery it may be brought to your room.  For patients admitted to the hospital, discharge time will be determined by your treatment team.  Patients discharged the day of surgery will not be allowed to drive home.   Special instructions:   - Preparing For Surgery  Before surgery, you can play an important role. Because skin is not sterile, your skin needs to be as free of germs as possible. You can reduce the number of germs on your skin by washing with CHG (chlorahexidine gluconate) Soap before surgery.  CHG is an antiseptic cleaner which kills germs and bonds with the skin to continue killing germs even after washing.  Please do not use if you have an allergy to CHG or  antibacterial soaps. If your skin becomes reddened/irritated stop using the CHG.  Do not shave (including legs and underarms) for at least 48 hours prior to first CHG shower. It is OK to shave your face.  Please follow these instructions carefully.   1. Shower the NIGHT BEFORE SURGERY and the MORNING OF SURGERY with CHG.   2. If you chose to wash your hair, wash your hair first as usual with your normal shampoo.  3. After you shampoo, rinse your hair and body thoroughly to remove the shampoo.  4. Use CHG as you would any other liquid soap. You can apply CHG directly to the skin and wash gently with a scrungie or a clean washcloth.   5. Apply the CHG Soap to your body ONLY FROM THE NECK DOWN.  Do not use on open wounds or open sores. Avoid contact with your eyes, ears, mouth and genitals (private parts). Wash genitals (private parts) with your normal soap.  6. Wash thoroughly, paying special attention to the area where your surgery will be performed.  7. Thoroughly rinse your body with warm water from the neck down.  8. DO NOT shower/wash with your normal soap after using and rinsing off the CHG Soap.  9. Pat yourself dry with a CLEAN TOWEL.   10. Wear CLEAN PAJAMAS   11. Place CLEAN SHEETS on your bed the night of your first shower and DO NOT SLEEP WITH PETS.    Day of Surgery: Do  not apply any deodorants/lotions. Please wear clean clothes to the hospital/surgery center.     Please read over the following fact sheets that you were given. Pain Booklet, Coughing and Deep Breathing, MRSA Information and Surgical Site Infection Prevention

## 2017-06-03 ENCOUNTER — Encounter (HOSPITAL_COMMUNITY): Payer: Self-pay

## 2017-06-03 ENCOUNTER — Encounter: Payer: Self-pay | Admitting: Vascular Surgery

## 2017-06-03 ENCOUNTER — Encounter (HOSPITAL_COMMUNITY)
Admission: RE | Admit: 2017-06-03 | Discharge: 2017-06-03 | Disposition: A | Discharge status: Home / Self Care | Payer: Medicare Other | Source: Ambulatory Visit | Attending: Neurological Surgery | Admitting: Neurological Surgery

## 2017-06-03 ENCOUNTER — Ambulatory Visit (INDEPENDENT_AMBULATORY_CARE_PROVIDER_SITE_OTHER): Payer: Medicare Other | Admitting: Vascular Surgery

## 2017-06-03 VITALS — BP 117/73 | HR 92 | Temp 97.6°F | Resp 18 | Ht 67.0 in | Wt 160.0 lb

## 2017-06-03 DIAGNOSIS — Z01812 Encounter for preprocedural laboratory examination: Secondary | ICD-10-CM | POA: Diagnosis not present

## 2017-06-03 DIAGNOSIS — N529 Male erectile dysfunction, unspecified: Secondary | ICD-10-CM | POA: Insufficient documentation

## 2017-06-03 DIAGNOSIS — E782 Mixed hyperlipidemia: Secondary | ICD-10-CM | POA: Insufficient documentation

## 2017-06-03 DIAGNOSIS — I2510 Atherosclerotic heart disease of native coronary artery without angina pectoris: Secondary | ICD-10-CM | POA: Insufficient documentation

## 2017-06-03 DIAGNOSIS — M5136 Other intervertebral disc degeneration, lumbar region: Secondary | ICD-10-CM

## 2017-06-03 HISTORY — DX: Personal history of other diseases of the digestive system: Z87.19

## 2017-06-03 HISTORY — DX: Male erectile dysfunction, unspecified: N52.9

## 2017-06-03 LAB — CBC
HCT: 48.9 % (ref 39.0–52.0)
Hemoglobin: 16.4 g/dL (ref 13.0–17.0)
MCH: 30.2 pg (ref 26.0–34.0)
MCHC: 33.5 g/dL (ref 30.0–36.0)
MCV: 90.1 fL (ref 78.0–100.0)
PLATELETS: 206 10*3/uL (ref 150–400)
RBC: 5.43 MIL/uL (ref 4.22–5.81)
RDW: 13.7 % (ref 11.5–15.5)
WBC: 7.5 10*3/uL (ref 4.0–10.5)

## 2017-06-03 LAB — TYPE AND SCREEN
ABO/RH(D): A POS
ANTIBODY SCREEN: NEGATIVE

## 2017-06-03 LAB — BASIC METABOLIC PANEL
Anion gap: 7 (ref 5–15)
BUN: 12 mg/dL (ref 6–20)
CALCIUM: 9.4 mg/dL (ref 8.9–10.3)
CO2: 24 mmol/L (ref 22–32)
CREATININE: 1.04 mg/dL (ref 0.61–1.24)
Chloride: 107 mmol/L (ref 101–111)
GFR calc Af Amer: >60 mL/min (ref >60)
GLUCOSE: 89 mg/dL (ref 65–99)
Potassium: 4 mmol/L (ref 3.5–5.1)
SODIUM: 138 mmol/L (ref 135–145)

## 2017-06-03 LAB — ABO/RH: ABO/RH(D): A POS

## 2017-06-03 LAB — SURGICAL PCR SCREEN
MRSA, PCR: NEGATIVE
Staphylococcus aureus: NEGATIVE
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
## 2017-06-03 NOTE — Progress Notes (Signed)
Anesthesia PAT Evaluation: Patient is a 71 year old male scheduled for L4-5 ALIF on 06/08/17 by Dr. Barnett Abu. Anterior exposure by Dr. Gretta Began.  History includes former smoker, HLD, CAD, inferior MI 08/31/00 (s/p distal RCA Penta stent 08/31/00; mid LAD Penta stent and PTCA OM2 09/23/00), NSTEMI 08/08/08 (s/p PTCA LAD 08/10/08), stenting, hiatal hernia, ED, polio, appendectomy, C4-7 ACDF 02/16/17   - PCP is Dr. Mechele Claude with [facility]. Last visit 05/26/17. He was seen for cough X 4-5 days. Patient was concerned he could be developing pneumonia. Lungs sounds were clear. CXR did not show any pneumonia. Spirometry was normal.  - Cardiologist is Dr. Kristeen Miss, last visit 05/08/17 for follow-up and preoperative evaluation. He felt patient was low risk for back surgery.   Meds include ASA 81 mg, Levaquin 500 mg daily 05/26/17 (refilled 06/02/17), Crestor, sildenafil PRN, Percocet. He reported being instructed to hold ASA for 7 days prior to surgery.   BP (!) 140/97   Pulse 96   Temp (!) 36.4 C   Resp 20   Ht  (1.702 m)   Wt 159 lb 9.6 oz (72.4 kg)   SpO2 99%   BMI 25.00 kg/m  Exam shows a pleasant white male in NAD. Lungs are clear but diminished anterior upper lobes. Heart RRR, no murmur noted. No carotid bruits noted. Neck extension is mildly limited (history ACDF). Mallampati 2-3. He reported cough/congestion much better since completing 7 day course of Levaquin. Refill started 06/02/17 because still with a morning mild productive cough. No chills or known fever. Denied SOB.   EKG 01/19/17 ([facility]): SR with frequent PVCs. Possible inferior infarct (age undetermined). Anterior infarct (age undetermined).  Cardiac cath 08/08/08 (Dr. Arvilla Meres): - LAD was a large vessel.  There was a stent in the midsection which was totally occluded.  Prior to the stent, there was mild 30% plaquing. The distal LAD filled faintly. - Left circumflex gave off a  small OM-1, a large OM-2, and a small OM-3. There was a 30% lesion in the mid AV groove circumflex in the ostium of the first marginal with approximately 60-70% lesion in the small marginal branch. - The right coronary artery was a large dominant vessel that gave off a PDA and several posterolaterals.  There was a focal calcified 75% lesion in the midsection in the distal section prior to the acute marginal branch.  There was a previous stent placed with just mild in-stent restenosis about 30%.  There was diffuse 30-40% plaquing throughout the distal RCA. - Left ventriculogram done in the RAO position showed an EF of approximately 50-55% with inferior basilar akinesis. ASSESSMENT: 1. Three-vessel coronary artery disease as described above with total left anterior descending lesion secondary to in-stent restenosis. 2. A 75% lesion in the mid right coronary artery. 3. Ejection fraction of 50-55% which is in the low normal range. PLAN/DISCUSSION:  I suspect his infarct artery is the LAD, although he does have a significant disease in the mid right coronary artery.  The plan now will be for PCI of the LAD with Dr. Riley Kill.  As his medical compliance is an issue, he may be best served with a bare-metal stent due to his inability for Plavix.  I will discuss with Dr. Riley Kill the plan regarding the right coronary artery whether we will proceed with staged intervention or medical therapy. PCI 08/10/08 (Dr. Shawnie Pons): ANGIOGRAPHIC DATA:  The left anterior descending artery which was the target vessel was  tortuous, heavily calcified, and previously stented. There was calcium and some plaquing proximally.  The stent demonstrates in-stent restenosis with significant narrowing at the distal third of the stent extending distally.  There was multiple areas of 30% and 40% narrowing more proximally and calcified disease overlying the diagonal. Following balloon dilatation, the final result resulted in a reduction to  less than 20% with good TIMI-3 flow into the diagonal and LAD distally.  The diagonal just after the stent site has about 50%-70% segmental plaquing in the midportion of that vessel, but with excellent flow. CONCLUSION:  Successful percutaneous balloon angioplasty of in-stent restenosis in a heavily calcified left anterior descending artery. DISPOSITION:  The patient will be treated medically at the present time. We will see him in close in followup in the office.  If he does develop recurrent restenosis, this may be able to be treated with a somewhat shorter stent.  At the present time, we will continue medical therapy. With his compromised finances, we may elect to limit Plavix duration, but I will discuss this with the patient in followup.  CXR 05/26/17: FINDINGS: The heart size and mediastinal contours are within normal limits. Both lungs are clear. The visualized skeletal structures are unremarkable. IMPRESSION: No active cardiopulmonary disease.  Spirometry 05/26/17 (scanned under Media tab): FVC 3.92 (101%), FEV1 2.78 (98%). Normal spirometry.  Preoperative labs noted. CBC and BMET WNL. T&S done.   Patient's lung sounds clear. Minimal cough noted during evaluation. WBC normal. O2 sats good. He respiratory illness continues to improve. He is still on Levaquin. He is aware that if symptoms worsen, develops SOB, fever, etc then he should notify PCP and surgeon and this could effect surgery plans. However, if he continues to improve then I anticipate that he can proceed as planned. I have left a voice message with Shanda Bumps at Dr. Verlee Rossetti office regarding this.   Velna Ochs [facility] Short Stay [facility]/Anesthesiology Phone [phone] 06/03/2017 4:12 PM

## 2017-06-03 NOTE — Progress Notes (Signed)
PCP - Mechele Claude Cardiologist - [place]  Chest x-ray - 05/26/17 EKG - 01/19/17 Stress Test - 12 years   ECHO - denies  Cardiac Cath - 12 years    Sending to anesthesia for review of recent cough and congestion.  Patient still has a cough but it is more in the morning than in the afternoon.  Patient is being treated with Levaquin. I spoke with [facility] about this.   Patient denies shortness of breath, fever, cough and chest pain at PAT appointment   Patient verbalized understanding of instructions that were given to them at the PAT appointment. Patient was also instructed that they will need to review over the PAT instructions again at home before surgery.

## 2017-06-05 ENCOUNTER — Other Ambulatory Visit: Payer: Self-pay | Admitting: Neurological Surgery

## 2017-06-05 NOTE — Progress Notes (Signed)
Per Devonia, patient is no longer having anterior lumber interbody fusion with Dr. Danielle Dess and Dr. Arbie Cookey and patient is now having Posterior Lumber Interbody Fusion.  Devonia made aware that new consent orders were needed.

## 2017-06-08 ENCOUNTER — Inpatient Hospital Stay (HOSPITAL_COMMUNITY): Payer: Medicare Other | Admitting: Vascular Surgery

## 2017-06-08 ENCOUNTER — Inpatient Hospital Stay (HOSPITAL_COMMUNITY): Payer: Medicare Other

## 2017-06-08 ENCOUNTER — Inpatient Hospital Stay (HOSPITAL_COMMUNITY): Payer: Medicare Other | Admitting: Certified Registered"

## 2017-06-08 ENCOUNTER — Encounter (HOSPITAL_COMMUNITY)
Admission: RE | Disposition: A | Discharge status: Home Health | Payer: Self-pay | Source: Ambulatory Visit | Attending: Neurological Surgery

## 2017-06-08 ENCOUNTER — Inpatient Hospital Stay (HOSPITAL_COMMUNITY)
Admission: RE | Admit: 2017-06-08 | Discharge: 2017-06-10 | DRG: 455 | Disposition: A | Discharge status: Home Health | Payer: Medicare Other | Source: Ambulatory Visit | Attending: Neurological Surgery | Admitting: Neurological Surgery

## 2017-06-08 DIAGNOSIS — M48061 Spinal stenosis, lumbar region without neurogenic claudication: Secondary | ICD-10-CM | POA: Diagnosis present

## 2017-06-08 DIAGNOSIS — M4316 Spondylolisthesis, lumbar region: Secondary | ICD-10-CM | POA: Diagnosis present

## 2017-06-08 DIAGNOSIS — I252 Old myocardial infarction: Secondary | ICD-10-CM

## 2017-06-08 DIAGNOSIS — Z981 Arthrodesis status: Secondary | ICD-10-CM | POA: Diagnosis not present

## 2017-06-08 DIAGNOSIS — I2510 Atherosclerotic heart disease of native coronary artery without angina pectoris: Secondary | ICD-10-CM | POA: Diagnosis present

## 2017-06-08 DIAGNOSIS — I700 Atherosclerosis of aorta: Secondary | ICD-10-CM | POA: Diagnosis present

## 2017-06-08 DIAGNOSIS — Z419 Encounter for procedure for purposes other than remedying health state, unspecified: Secondary | ICD-10-CM

## 2017-06-08 DIAGNOSIS — Z87891 Personal history of nicotine dependence: Secondary | ICD-10-CM | POA: Diagnosis not present

## 2017-06-08 DIAGNOSIS — M4726 Other spondylosis with radiculopathy, lumbar region: Secondary | ICD-10-CM | POA: Diagnosis present

## 2017-06-08 DIAGNOSIS — E785 Hyperlipidemia, unspecified: Secondary | ICD-10-CM | POA: Diagnosis not present

## 2017-06-08 DIAGNOSIS — M4326 Fusion of spine, lumbar region: Secondary | ICD-10-CM | POA: Diagnosis not present

## 2017-06-08 SURGERY — POSTERIOR LUMBAR FUSION 1 LEVEL
Anesthesia: General | Site: Back

## 2017-06-08 MED ORDER — THROMBIN 20000 UNITS EX SOLR
CUTANEOUS | Status: DC | PRN
  Administered 2017-06-08: 20 mL via TOPICAL

## 2017-06-08 MED ORDER — ROCURONIUM BROMIDE 10 MG/ML (PF) SYRINGE
PREFILLED_SYRINGE | INTRAVENOUS | Status: AC
  Filled 2017-06-08: qty 5

## 2017-06-08 MED ORDER — OXYCODONE-ACETAMINOPHEN 5-325 MG PO TABS
0.5000 | ORAL_TABLET | Freq: Every day | ORAL | Status: DC
  Administered 2017-06-08: 0.5 via ORAL
  Filled 2017-06-08: qty 1

## 2017-06-08 MED ORDER — HYDROMORPHONE HCL 1 MG/ML IJ SOLN: 0.2500 mg | INTRAMUSCULAR | Status: DC | PRN

## 2017-06-08 MED ORDER — LIDOCAINE-EPINEPHRINE 1 %-1:100000 IJ SOLN
INTRAMUSCULAR | Status: AC
  Filled 2017-06-08: qty 1

## 2017-06-08 MED ORDER — CHLORHEXIDINE GLUCONATE CLOTH 2 % EX PADS: 6.0000 | MEDICATED_PAD | Freq: Once | CUTANEOUS | Status: DC

## 2017-06-08 MED ORDER — SUGAMMADEX SODIUM 200 MG/2ML IV SOLN
INTRAVENOUS | Status: AC
  Filled 2017-06-08: qty 2

## 2017-06-08 MED ORDER — ACETAMINOPHEN 650 MG RE SUPP: 650.0000 mg | RECTAL | Status: DC | PRN

## 2017-06-08 MED ORDER — ONDANSETRON HCL 4 MG/2ML IJ SOLN: 4.0000 mg | Freq: Four times a day (QID) | INTRAMUSCULAR | Status: DC | PRN

## 2017-06-08 MED ORDER — FENTANYL CITRATE (PF) 250 MCG/5ML IJ SOLN
INTRAMUSCULAR | Status: AC
  Filled 2017-06-08: qty 5

## 2017-06-08 MED ORDER — SUCCINYLCHOLINE CHLORIDE 200 MG/10ML IV SOSY
PREFILLED_SYRINGE | INTRAVENOUS | Status: AC
  Filled 2017-06-08: qty 10

## 2017-06-08 MED ORDER — BUPIVACAINE HCL (PF) 0.5 % IJ SOLN
INTRAMUSCULAR | Status: DC | PRN
  Administered 2017-06-08: 10 mL
  Administered 2017-06-08: 5 mL

## 2017-06-08 MED ORDER — CEFAZOLIN SODIUM-DEXTROSE 2-4 GM/100ML-% IV SOLN
2.0000 g | INTRAVENOUS | Status: AC
  Administered 2017-06-08: 2 g via INTRAVENOUS
  Filled 2017-06-08: qty 100

## 2017-06-08 MED ORDER — PROPOFOL 10 MG/ML IV BOLUS
INTRAVENOUS | Status: AC
  Filled 2017-06-08: qty 20

## 2017-06-08 MED ORDER — LIDOCAINE HCL (CARDIAC) 20 MG/ML IV SOLN
INTRAVENOUS | Status: DC | PRN
  Administered 2017-06-08: 80 mg via INTRAVENOUS

## 2017-06-08 MED ORDER — MIDAZOLAM HCL 2 MG/2ML IJ SOLN
INTRAMUSCULAR | Status: AC
  Filled 2017-06-08: qty 2

## 2017-06-08 MED ORDER — METHOCARBAMOL 500 MG PO TABS
500.0000 mg | ORAL_TABLET | Freq: Four times a day (QID) | ORAL | Status: DC | PRN
  Administered 2017-06-08 – 2017-06-10 (×6): 500 mg via ORAL
  Filled 2017-06-08 (×6): qty 1

## 2017-06-08 MED ORDER — THROMBIN 5000 UNITS EX SOLR
CUTANEOUS | Status: AC
  Filled 2017-06-08: qty 5000

## 2017-06-08 MED ORDER — SODIUM CHLORIDE 0.9% FLUSH: 3.0000 mL | INTRAVENOUS | Status: DC | PRN

## 2017-06-08 MED ORDER — ONDANSETRON HCL 4 MG PO TABS: 4.0000 mg | ORAL_TABLET | Freq: Four times a day (QID) | ORAL | Status: DC | PRN

## 2017-06-08 MED ORDER — BACITRACIN 50000 UNITS IM SOLR
INTRAMUSCULAR | Status: DC | PRN
  Administered 2017-06-08: 500 mL

## 2017-06-08 MED ORDER — ALUM & MAG HYDROXIDE-SIMETH 200-200-20 MG/5ML PO SUSP: 30.0000 mL | Freq: Four times a day (QID) | ORAL | Status: DC | PRN

## 2017-06-08 MED ORDER — ASPIRIN 81 MG PO CHEW
81.0000 mg | CHEWABLE_TABLET | Freq: Every day | ORAL | Status: DC
  Administered 2017-06-08 – 2017-06-10 (×3): 81 mg via ORAL
  Filled 2017-06-08 (×3): qty 1

## 2017-06-08 MED ORDER — MIDAZOLAM HCL 5 MG/5ML IJ SOLN
INTRAMUSCULAR | Status: DC | PRN
  Administered 2017-06-08: 2 mg via INTRAVENOUS

## 2017-06-08 MED ORDER — DEXAMETHASONE SODIUM PHOSPHATE 10 MG/ML IJ SOLN
INTRAMUSCULAR | Status: DC | PRN
  Administered 2017-06-08: 10 mg via INTRAVENOUS

## 2017-06-08 MED ORDER — PHENYLEPHRINE HCL 10 MG/ML IJ SOLN
INTRAVENOUS | Status: DC | PRN
  Administered 2017-06-08: 25 ug/min via INTRAVENOUS

## 2017-06-08 MED ORDER — MENTHOL 3 MG MT LOZG: 1.0000 | LOZENGE | OROMUCOSAL | Status: DC | PRN

## 2017-06-08 MED ORDER — LACTATED RINGERS IV SOLN
INTRAVENOUS | Status: DC | PRN
  Administered 2017-06-08 (×2): via INTRAVENOUS

## 2017-06-08 MED ORDER — THROMBIN 20000 UNITS EX SOLR
CUTANEOUS | Status: AC
  Filled 2017-06-08: qty 20000

## 2017-06-08 MED ORDER — PHENYLEPHRINE 40 MCG/ML (10ML) SYRINGE FOR IV PUSH (FOR BLOOD PRESSURE SUPPORT)
PREFILLED_SYRINGE | INTRAVENOUS | Status: AC
  Filled 2017-06-08: qty 10

## 2017-06-08 MED ORDER — CEFAZOLIN SODIUM-DEXTROSE 2-4 GM/100ML-% IV SOLN
2.0000 g | Freq: Three times a day (TID) | INTRAVENOUS | Status: AC
  Administered 2017-06-08 (×2): 2 g via INTRAVENOUS
  Filled 2017-06-08 (×2): qty 100

## 2017-06-08 MED ORDER — PHENYLEPHRINE HCL 10 MG/ML IJ SOLN
INTRAMUSCULAR | Status: DC | PRN
  Administered 2017-06-08: 40 ug via INTRAVENOUS
  Administered 2017-06-08: 80 ug via INTRAVENOUS

## 2017-06-08 MED ORDER — SENNA 8.6 MG PO TABS
1.0000 | ORAL_TABLET | Freq: Two times a day (BID) | ORAL | Status: DC
  Administered 2017-06-08 – 2017-06-10 (×4): 8.6 mg via ORAL
  Filled 2017-06-08 (×5): qty 1

## 2017-06-08 MED ORDER — THROMBIN 5000 UNITS EX SOLR
OROMUCOSAL | Status: DC | PRN
  Administered 2017-06-08: 5 mL via TOPICAL

## 2017-06-08 MED ORDER — BISACODYL 10 MG RE SUPP: 10.0000 mg | Freq: Every day | RECTAL | Status: DC | PRN

## 2017-06-08 MED ORDER — LACTATED RINGERS IV SOLN
INTRAVENOUS | Status: DC
  Administered 2017-06-08: 15:00:00 via INTRAVENOUS

## 2017-06-08 MED ORDER — MEPERIDINE HCL 25 MG/ML IJ SOLN: 6.2500 mg | INTRAMUSCULAR | Status: DC | PRN

## 2017-06-08 MED ORDER — FENTANYL CITRATE (PF) 100 MCG/2ML IJ SOLN
INTRAMUSCULAR | Status: DC | PRN
  Administered 2017-06-08 (×2): 50 ug via INTRAVENOUS
  Administered 2017-06-08: 100 ug via INTRAVENOUS
  Administered 2017-06-08: 50 ug via INTRAVENOUS
  Administered 2017-06-08: 150 ug via INTRAVENOUS

## 2017-06-08 MED ORDER — ONDANSETRON HCL 4 MG/2ML IJ SOLN
INTRAMUSCULAR | Status: AC
  Filled 2017-06-08: qty 2

## 2017-06-08 MED ORDER — HYDROCODONE-ACETAMINOPHEN 5-325 MG PO TABS
1.0000 | ORAL_TABLET | ORAL | Status: DC | PRN
  Administered 2017-06-08 – 2017-06-09 (×6): 2 via ORAL
  Filled 2017-06-08 (×6): qty 2

## 2017-06-08 MED ORDER — ACETAMINOPHEN 325 MG PO TABS: 650.0000 mg | ORAL_TABLET | ORAL | Status: DC | PRN

## 2017-06-08 MED ORDER — ONDANSETRON HCL 4 MG/2ML IJ SOLN
INTRAMUSCULAR | Status: DC | PRN
  Administered 2017-06-08: 4 mg via INTRAVENOUS

## 2017-06-08 MED ORDER — METHOCARBAMOL 1000 MG/10ML IJ SOLN: 500.0000 mg | Freq: Four times a day (QID) | INTRAVENOUS | Status: DC | PRN

## 2017-06-08 MED ORDER — LEVOFLOXACIN 500 MG PO TABS
500.0000 mg | ORAL_TABLET | Freq: Every day | ORAL | Status: DC
  Administered 2017-06-09 – 2017-06-10 (×2): 500 mg via ORAL
  Filled 2017-06-08 (×3): qty 1

## 2017-06-08 MED ORDER — ONDANSETRON HCL 4 MG/2ML IJ SOLN: 4.0000 mg | Freq: Once | INTRAMUSCULAR | Status: DC | PRN

## 2017-06-08 MED ORDER — FLEET ENEMA 7-19 GM/118ML RE ENEM: 1.0000 | ENEMA | Freq: Once | RECTAL | Status: DC | PRN

## 2017-06-08 MED ORDER — PHENOL 1.4 % MT LIQD: 1.0000 | OROMUCOSAL | Status: DC | PRN

## 2017-06-08 MED ORDER — PROPOFOL 10 MG/ML IV BOLUS
INTRAVENOUS | Status: DC | PRN
  Administered 2017-06-08: 100 mg via INTRAVENOUS

## 2017-06-08 MED ORDER — SODIUM CHLORIDE 0.9% FLUSH
3.0000 mL | Freq: Two times a day (BID) | INTRAVENOUS | Status: DC
  Administered 2017-06-08 – 2017-06-09 (×2): 3 mL via INTRAVENOUS

## 2017-06-08 MED ORDER — HYDROMORPHONE HCL 1 MG/ML IJ SOLN: 0.5000 mg | INTRAMUSCULAR | Status: DC | PRN

## 2017-06-08 MED ORDER — LIDOCAINE 2% (20 MG/ML) 5 ML SYRINGE
INTRAMUSCULAR | Status: AC
  Filled 2017-06-08: qty 5

## 2017-06-08 MED ORDER — SUGAMMADEX SODIUM 200 MG/2ML IV SOLN
INTRAVENOUS | Status: DC | PRN
  Administered 2017-06-08: 145.2 mg via INTRAVENOUS

## 2017-06-08 MED ORDER — POLYETHYLENE GLYCOL 3350 17 G PO PACK: 17.0000 g | PACK | Freq: Every day | ORAL | Status: DC | PRN

## 2017-06-08 MED ORDER — LIDOCAINE-EPINEPHRINE 1 %-1:100000 IJ SOLN
INTRAMUSCULAR | Status: DC | PRN
  Administered 2017-06-08: 5 mL

## 2017-06-08 MED ORDER — DOCUSATE SODIUM 100 MG PO CAPS
100.0000 mg | ORAL_CAPSULE | Freq: Two times a day (BID) | ORAL | Status: DC
  Administered 2017-06-08 – 2017-06-10 (×4): 100 mg via ORAL
  Filled 2017-06-08 (×4): qty 1

## 2017-06-08 MED ORDER — 0.9 % SODIUM CHLORIDE (POUR BTL) OPTIME
TOPICAL | Status: DC | PRN
  Administered 2017-06-08: 1000 mL

## 2017-06-08 MED ORDER — ROCURONIUM BROMIDE 100 MG/10ML IV SOLN
INTRAVENOUS | Status: DC | PRN
  Administered 2017-06-08: 10 mg via INTRAVENOUS
  Administered 2017-06-08: 50 mg via INTRAVENOUS
  Administered 2017-06-08: 20 mg via INTRAVENOUS

## 2017-06-08 MED ORDER — BUPIVACAINE HCL (PF) 0.5 % IJ SOLN
INTRAMUSCULAR | Status: AC
  Filled 2017-06-08: qty 30

## 2017-06-08 SURGICAL SUPPLY — 66 items
BAG DECANTER FOR FLEXI CONT (MISCELLANEOUS) ×3 IMPLANT
BASKET BONE COLLECTION (BASKET) ×3 IMPLANT
BLADE CLIPPER SURG (BLADE) IMPLANT
BONE EQUIVA 10CC (Bone Implant) ×3 IMPLANT
BUR MATCHSTICK NEURO 3.0 LAGG (BURR) ×3 IMPLANT
CANISTER SUCT 3000ML PPV (MISCELLANEOUS) ×3 IMPLANT
CARTRIDGE OIL MAESTRO DRILL (MISCELLANEOUS) IMPLANT
CONT SPEC 4OZ CLIKSEAL STRL BL (MISCELLANEOUS) ×3 IMPLANT
COVER BACK TABLE 60X90IN (DRAPES) ×3 IMPLANT
DECANTER SPIKE VIAL GLASS SM (MISCELLANEOUS) ×3 IMPLANT
DERMABOND ADVANCED (GAUZE/BANDAGES/DRESSINGS) ×2
DERMABOND ADVANCED .7 DNX12 (GAUZE/BANDAGES/DRESSINGS) ×1 IMPLANT
DEVICE DISSECT PLASMABLAD 3.0S (MISCELLANEOUS) ×1 IMPLANT
DIFFUSER DRILL AIR PNEUMATIC (MISCELLANEOUS) IMPLANT
DRAPE C-ARM 42X72 X-RAY (DRAPES) ×6 IMPLANT
DRAPE HALF SHEET 40X57 (DRAPES) IMPLANT
DRAPE LAPAROTOMY 100X72X124 (DRAPES) ×3 IMPLANT
DRAPE POUCH INSTRU U-SHP 10X18 (DRAPES) ×3 IMPLANT
DRSG OPSITE POSTOP 4X6 (GAUZE/BANDAGES/DRESSINGS) ×3 IMPLANT
DURAPREP 26ML APPLICATOR (WOUND CARE) ×3 IMPLANT
DURASEAL APPLICATOR TIP (TIP) IMPLANT
DURASEAL SPINE SEALANT 3ML (MISCELLANEOUS) IMPLANT
ELECT REM PT RETURN 9FT ADLT (ELECTROSURGICAL) ×3
ELECTRODE REM PT RTRN 9FT ADLT (ELECTROSURGICAL) ×1 IMPLANT
GAUZE SPONGE 4X4 12PLY STRL (GAUZE/BANDAGES/DRESSINGS) IMPLANT
GAUZE SPONGE 4X4 16PLY XRAY LF (GAUZE/BANDAGES/DRESSINGS) ×3 IMPLANT
GLOVE BIOGEL PI IND STRL 7.0 (GLOVE) ×3 IMPLANT
GLOVE BIOGEL PI IND STRL 8 (GLOVE) ×1 IMPLANT
GLOVE BIOGEL PI IND STRL 8.5 (GLOVE) ×3 IMPLANT
GLOVE BIOGEL PI INDICATOR 7.0 (GLOVE) ×6
GLOVE BIOGEL PI INDICATOR 8 (GLOVE) ×2
GLOVE BIOGEL PI INDICATOR 8.5 (GLOVE) ×6
GLOVE ECLIPSE 8.5 STRL (GLOVE) ×6 IMPLANT
GLOVE SS BIOGEL STRL SZ 7 (GLOVE) ×3 IMPLANT
GLOVE SUPERSENSE BIOGEL SZ 7 (GLOVE) ×6
GOWN STRL REUS W/ TWL LRG LVL3 (GOWN DISPOSABLE) ×2 IMPLANT
GOWN STRL REUS W/ TWL XL LVL3 (GOWN DISPOSABLE) ×1 IMPLANT
GOWN STRL REUS W/TWL 2XL LVL3 (GOWN DISPOSABLE) ×9 IMPLANT
GOWN STRL REUS W/TWL LRG LVL3 (GOWN DISPOSABLE) ×4
GOWN STRL REUS W/TWL XL LVL3 (GOWN DISPOSABLE) ×2
HEMOSTAT POWDER KIT SURGIFOAM (HEMOSTASIS) ×3 IMPLANT
KIT BASIN OR (CUSTOM PROCEDURE TRAY) ×3 IMPLANT
KIT ROOM TURNOVER OR (KITS) ×3 IMPLANT
NEEDLE HYPO 22GX1.5 SAFETY (NEEDLE) ×3 IMPLANT
NS IRRIG 1000ML POUR BTL (IV SOLUTION) ×3 IMPLANT
OIL CARTRIDGE MAESTRO DRILL (MISCELLANEOUS)
PACK LAMINECTOMY NEURO (CUSTOM PROCEDURE TRAY) ×3 IMPLANT
PAD ARMBOARD 7.5X6 YLW CONV (MISCELLANEOUS) ×15 IMPLANT
PATTIES SURGICAL .5 X1 (DISPOSABLE) ×3 IMPLANT
PLASMABLADE 3.0S (MISCELLANEOUS) ×3
ROD TI ALLOY CVD VIT 40MM (Rod) ×6 IMPLANT
SCREW VITALITY PA 6.5X45MM (Screw) ×12 IMPLANT
SPACER ZYSTON STRT 12X25X10X8 (Spacer) ×6 IMPLANT
SPONGE LAP 4X18 X RAY DECT (DISPOSABLE) IMPLANT
SPONGE SURGIFOAM ABS GEL 100 (HEMOSTASIS) ×3 IMPLANT
SUT PROLENE 6 0 BV (SUTURE) IMPLANT
SUT VIC AB 1 CT1 18XBRD ANBCTR (SUTURE) ×1 IMPLANT
SUT VIC AB 1 CT1 8-18 (SUTURE) ×2
SUT VIC AB 2-0 CP2 18 (SUTURE) ×3 IMPLANT
SUT VIC AB 3-0 SH 8-18 (SUTURE) ×3 IMPLANT
SYR 3ML LL SCALE MARK (SYRINGE) ×12 IMPLANT
TOP CLOSURE TORQ LIMIT (Neuro Prosthesis/Implant) ×12 IMPLANT
TOWEL GREEN STERILE (TOWEL DISPOSABLE) ×3 IMPLANT
TOWEL GREEN STERILE FF (TOWEL DISPOSABLE) ×3 IMPLANT
TRAY FOLEY W/METER SILVER 16FR (SET/KITS/TRAYS/PACK) ×3 IMPLANT
WATER STERILE IRR 1000ML POUR (IV SOLUTION) ×3 IMPLANT
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
## 2017-06-08 NOTE — Progress Notes (Signed)
Attempt to call report -placed on hold

## 2017-06-08 NOTE — Anesthesia Postprocedure Evaluation (Signed)
Anesthesia Post Note  Patient: [name] [name].  Procedure(s) Performed: Procedure(s) (LRB): Lumbar four -five Posterior lumbar interbody fusion (N/A)     Patient location during evaluation: PACU Anesthesia Type: General Level of consciousness: awake and alert Pain management: pain level controlled Vital Signs Assessment: post-procedure vital signs reviewed and stable Respiratory status: spontaneous breathing, nonlabored ventilation, respiratory function stable and patient connected to nasal cannula oxygen Cardiovascular status: blood pressure returned to baseline and stable Postop Assessment: no apparent nausea or vomiting Anesthetic complications: no    Last Vitals:  Vitals:   06/08/17 1314 06/08/17 1329  BP: 113/84 132/78  Pulse: 78 82  Resp: (!) 23 16  Temp:    SpO2: 93% (!) 86%    Last Pain:  Vitals:   06/08/17 1200  TempSrc:   PainSc: 0-No pain                 Jacqueline Spofford DAVID

## 2017-06-08 NOTE — Transfer of Care (Signed)
Immediate Anesthesia Transfer of Care Note  Patient: [name] [name].  Procedure(s) Performed: Procedure(s): Lumbar four -five Posterior lumbar interbody fusion (N/A)  Patient Location: PACU  Anesthesia Type:General  Level of Consciousness: awake, alert  and oriented  Airway & Oxygen Therapy: Patient connected to face mask oxygen  Post-op Assessment: Post -op Vital signs reviewed and stable  Post vital signs: stable  Last Vitals:  Vitals:   06/08/17 0551  BP: (!) 152/79  Pulse: 87  Resp: 20  Temp: 36.5 C  SpO2: 100%    Last Pain:  Vitals:   06/08/17 0551  TempSrc: Oral         Complications: No apparent anesthesia complications

## 2017-06-08 NOTE — Anesthesia Preprocedure Evaluation (Addendum)
Anesthesia Evaluation  Patient identified by MRN, date of birth, ID band Patient awake    Reviewed: Allergy & Precautions, NPO status , Patient's Chart, lab work & pertinent test results  Airway Mallampati: II  TM Distance: >3 FB Neck ROM: Limited    Dental  (+) Missing, Dental Advisory Given, Poor Dentition   Pulmonary former smoker,    Pulmonary exam normal        Cardiovascular + CAD and + Past MI  Normal cardiovascular exam     Neuro/Psych    GI/Hepatic   Endo/Other    Renal/GU      Musculoskeletal   Abdominal   Peds  Hematology   Anesthesia Other Findings Previous ACDF  Reproductive/Obstetrics                            Anesthesia Physical Anesthesia Plan  ASA: III  Anesthesia Plan: General   Post-op Pain Management:    Induction: Intravenous  PONV Risk Score and Plan: 2 and Ondansetron and Dexamethasone  Airway Management Planned: Oral ETT  Additional Equipment:   Intra-op Plan:   Post-operative Plan: Extubation in OR  Informed Consent: I have reviewed the patients History and Physical, chart, labs and discussed the procedure including the risks, benefits and alternatives for the proposed anesthesia with the patient or authorized representative who has indicated his/her understanding and acceptance.     Plan Discussed with: CRNA and Surgeon  Anesthesia Plan Comments:         Anesthesia Quick Evaluation

## 2017-06-08 NOTE — Op Note (Signed)
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 Postoperative diagnosis: Same Procedure: L4-L5 decompressive laminectomy decompression of L4 and L5 nerve roots, posterior lumbar interbody arthrodesis with peek spacers local autograft and allograft, pedicle screw fixation L4-L5, posterior lateral arthrodesis L4-L5  Surgeon: Barnett Abu M.D.  Asst.: Tressie Stalker M.D.  Indications: Patient is [name] [name]. is a 71 y.o. male who who's had significant back pain and lumbar radiculopathy for over a years period time. A lumbar MRI demonstrates advanced spondylolisthesis with moderate canal stenosis. he was advised regarding surgical intervention.  Procedure: The patient was brought to the operating room supine on a stretcher. After the smooth induction of general endotracheal anesthesia she was turned prone and the back was prepped with alcohol and DuraPrep. The back was then draped sterilely. A midline incision was created and carried down to the lumbar dorsal fascia. A localizing radiograph identified the L4 and L5 spinous processes. A subligamentous dissection was created at L4 and L5 to expose the interlaminar space at L4 and L5 and the facet joints over the L4-L5 interspace. Laminotomies were were then created removing the entire inferior margin of the lamina of L4 including the inferior facet at the L4-L5 joint. The yellow ligament was taken up and the common dural tube was exposed along with the L4 nerve root superiorly, and the L5 nerve root inferiorly, the disc space was exposed and epidural veins in this region were cauterized and divided. The L4 nerve roots and the L5 nerve root were dissected with care taken to protect them. The disc space was opened and a combination of curettes and rongeurs was used to evacuate the disc space fully. The endplates were removed using sharp curettes. An interbody spacer was placed  to distract the disc space while the contralateral discectomy was performed. When the entirety of the disc was removed and the endplates were prepared final sizing of the disc space was obtained 12 mm 8 lordotic 25 mm long peek spacers were chosen and packed with autograft and allograft and placed into the interspace. The remainder of the interspace was packed with autograft and allograft consisting of equivocal bone 10 mL.[name] Pedicle entry sites were then chosen using fluoroscopic guidance and 6.5 x 45 mm screws were placed in L4 and 6.5 x 45 mm screws were placed in L5. The lateral gutters were decorticated and graft was packed in the posterolateral gutters between L4 and L5. Final radiographs were obtained after placing appropriately sized rods between the pedicle screws at L4-L5 and torquing these to the appropriate tension. The surgical site was inspected carefully to assure the L4 and L5 nerve roots were well decompressed, hemostasis was obtained, and the graft was well packed. Then the retractors were removed and the wound was closed with #1 Vicryl in the lumbar dorsal fascia 2-0 Vicryl in the subcutaneous tissue and 3-0 Vicryl subcuticularly. When he cc of half percent Marcaine was injected into the paraspinous musculature at the time of closure. Blood loss was estimated at 250 cc. The patient tolerated procedure well and was returned to the recovery room in stable condition.

## 2017-06-08 NOTE — Progress Notes (Signed)
Physical Therapy Evaluation Patient Details Name: [name] [name]. MRN: [id number] DOB: 1946-03-26 Today's Date: 06/08/2017   History of Present Illness  71 y.o. male with h/o spondylolisthesis and moderate canal stenosis; s/p L4-L5 decompressive laminectomy, decompression of L4 and L5 nerve roots, posterior lumbar interbody arthrodesis with peek spacers local autograft and allograft, pedicle screw fixation L4-L5, and posterior lateral arthrodesis L4-L5  Clinical Impression  Evaluated patient and findings listed below. Educated him on surgical precautions, bed mobility, transfers, and car transfers. Prior to admission patient was independent with functional mobility and has caregivers check in intermittently. Patient has h/o of polio and demonstrates R LE weakness and foot drop. Patient has developed compensation techniques for safe mobility. Demonstrates good prognosis and capacity for return to prior level of function. Further acute PT services are not recommended at this time. PT will sign off.     Follow Up Recommendations No PT follow up    Equipment Recommendations  None recommended by PT    Recommendations for Other Services       Precautions / Restrictions Precautions Precautions: Back;Fall Precaution Booklet Issued: Yes (comment) Precaution Comments: educated on surgical back precautions, log roll technique, and transfers Required Braces or Orthoses: Spinal Brace Spinal Brace: Lumbar corset;Applied in sitting position Restrictions Weight Bearing Restrictions: No      Mobility  Bed Mobility Overal bed mobility: Needs Assistance Bed Mobility: Rolling;Sidelying to Sit Rolling: Min guard Sidelying to sit: Min guard       General bed mobility comments: required increased time and effort to roll and elevate trunk to upright; VCs for proper log roll technique  Transfers Overall transfer level: Needs assistance Equipment used: Rolling walker (2 wheeled) Transfers: Sit  to/from Stand Sit to Stand: Supervision         General transfer comment: minimal VCs to scoot forward before standing. Started with RW secondary to report of dizziness at end of session able to sit <> stand without AD  Ambulation/Gait Ambulation/Gait assistance: Supervision Ambulation Distance (Feet): 410 Feet Assistive device: Rolling walker (2 wheeled) Gait Pattern/deviations: Step-through pattern;Decreased dorsiflexion - right;Drifts right/left;Decreased step length - right Gait velocity: decreased   General Gait Details: steady pace, minimal reliance on RW for stability that improved with distance. No significant LOB noted. Patient has previous h/o polio with R foot drop but has developed compensations for safe ambulation  Stairs Stairs: Yes Stairs assistance: Min guard Stair Management: One rail Right;Step to pattern;Sideways Number of Stairs: 4 General stair comments: ascending able to go forward with step to pattern; descending sideways with both hands on rail and step to pattern  Wheelchair Mobility    Modified Rankin (Stroke Patients Only)       Balance Overall balance assessment: Needs assistance         Standing balance support: During functional activity;No upper extremity supported Standing balance-Leahy Scale: Good                               Pertinent Vitals/Pain Pain Assessment: Faces Faces Pain Scale: Hurts a little bit Pain Location: back (incision) Pain Descriptors / Indicators: Discomfort Pain Intervention(s): Premedicated before session    Home Living Family/patient expects to be discharged to:: Private residence Living Arrangements: Alone Available Help at Discharge: Personal care attendant;Family;Available PRN/intermittently Type of Home: House Home Access: Stairs to enter Entrance Stairs-Rails: Doctor, general practice of Steps: 3 Home Layout: One level Home Equipment: Cane - single point  Prior Function  Level of Independence: Independent               Hand Dominance        Extremity/Trunk Assessment   Upper Extremity Assessment Upper Extremity Assessment: Defer to OT evaluation    Lower Extremity Assessment Lower Extremity Assessment: RLE deficits/detail;LLE deficits/detail RLE Deficits / Details: h/o polio; R LE general weakness       Communication   Communication: No difficulties  Cognition Arousal/Alertness: Awake/alert Behavior During Therapy: WFL for tasks assessed/performed Overall Cognitive Status: Within Functional Limits for tasks assessed                                 General Comments: cognition not formally assessed; [facility] for tasks      General Comments      Exercises     Assessment/Plan    PT Assessment Patent does not need any further PT services  PT Problem List         PT Treatment Interventions      PT Goals (Current goals can be found in the Care Plan section)  Acute Rehab PT Goals Patient Stated Goal: to go home PT Goal Formulation: With patient Time For Goal Achievement: 06/22/17 Potential to Achieve Goals: Good    Frequency     Barriers to discharge        Co-evaluation               AM-PAC PT "6 Clicks" Daily Activity  Outcome Measure Difficulty turning over in bed (including adjusting bedclothes, sheets and blankets)?: Unable Difficulty moving from lying on back to sitting on the side of the bed? : Unable Difficulty sitting down on and standing up from a chair with arms (e.g., wheelchair, bedside commode, etc,.)?: A Little Help needed moving to and from a bed to chair (including a wheelchair)?: A Little Help needed walking in hospital room?: A Little Help needed climbing 3-5 steps with a railing? : A Lot 6 Click Score: 13    End of Session Equipment Utilized During Treatment: Gait belt;Back brace Activity Tolerance: Patient tolerated treatment well Patient left: in chair;with call bell/phone  within reach Nurse Communication: Mobility status PT Visit Diagnosis: Difficulty in walking, not elsewhere classified (R26.2)    Time: 1610-9604 PT Time Calculation (min) (ACUTE ONLY): 34 min   Charges:   PT Evaluation $PT Eval Moderate Complexity: 1 Mod PT Treatments $Gait Training: 8-22 mins   PT G Codes:   PT G-Codes **NOT FOR INPATIENT CLASS** Functional Assessment Tool Used: Clinical judgement Functional Limitation: Mobility: Walking and moving around Mobility: Walking and Moving Around Current Status (V4098): At least 1 percent but less than 20 percent impaired, limited or restricted Mobility: Walking and Moving Around Goal Status [phone]): At least 1 percent but less than 20 percent impaired, limited or restricted Mobility: Walking and Moving Around Discharge Status [phone]): At least 1 percent but less than 20 percent impaired, limited or restricted    [facility], SPT #[phone] office   Fonnie Birkenhead 06/08/2017, 5:21 PM

## 2017-06-08 NOTE — Anesthesia Procedure Notes (Addendum)
Procedure Name: Intubation Date/Time: 06/08/2017 7:52 AM Performed by: Lavell Luster Pre-anesthesia Checklist: Patient identified, Emergency Drugs available, Suction available, Patient being monitored and Timeout performed Patient Re-evaluated:Patient Re-evaluated prior to induction Oxygen Delivery Method: Circle system utilized Preoxygenation: Pre-oxygenation with 100% oxygen Induction Type: IV induction Ventilation: Mask ventilation without difficulty Laryngoscope Size: Mac and 4 Grade View: Grade III Tube type: Oral Tube size: 7.5 mm Number of attempts: 1 Airway Equipment and Method: Stylet Placement Confirmation: ETT inserted through vocal cords under direct vision,  positive ETCO2 and breath sounds checked- equal and bilateral Secured at: 21 cm Tube secured with: Tape Dental Injury: Teeth and Oropharynx as per pre-operative assessment  Difficulty Due To: Difficulty was anticipated, Difficult Airway- due to reduced neck mobility and Difficult Airway- due to dentition

## 2017-06-09 LAB — CBC
HCT: 37.4 % — ABNORMAL LOW (ref 39.0–52.0)
HEMOGLOBIN: 12.3 g/dL — AB (ref 13.0–17.0)
MCH: 29.6 pg (ref 26.0–34.0)
MCHC: 32.9 g/dL (ref 30.0–36.0)
MCV: 90.1 fL (ref 78.0–100.0)
Platelets: 164 10*3/uL (ref 150–400)
RBC: 4.15 MIL/uL — ABNORMAL LOW (ref 4.22–5.81)
RDW: 13.7 % (ref 11.5–15.5)
WBC: 8.9 10*3/uL (ref 4.0–10.5)

## 2017-06-09 LAB — BASIC METABOLIC PANEL
ANION GAP: 6 (ref 5–15)
BUN: 18 mg/dL (ref 6–20)
CHLORIDE: 104 mmol/L (ref 101–111)
CO2: 26 mmol/L (ref 22–32)
CREATININE: 0.82 mg/dL (ref 0.61–1.24)
Calcium: 8.4 mg/dL — ABNORMAL LOW (ref 8.9–10.3)
GFR calc non Af Amer: >60 mL/min (ref >60)
Glucose, Bld: 131 mg/dL — ABNORMAL HIGH (ref 65–99)
POTASSIUM: 3.7 mmol/L (ref 3.5–5.1)
SODIUM: 136 mmol/L (ref 135–145)

## 2017-06-09 MED ORDER — DEXAMETHASONE 4 MG PO TABS
2.0000 mg | ORAL_TABLET | Freq: Two times a day (BID) | ORAL | Status: DC
  Administered 2017-06-09 – 2017-06-10 (×3): 2 mg via ORAL
  Filled 2017-06-09 (×3): qty 1

## 2017-06-09 MED ORDER — OXYCODONE-ACETAMINOPHEN 5-325 MG PO TABS
1.0000 | ORAL_TABLET | ORAL | Status: DC | PRN
  Administered 2017-06-09: 1 via ORAL
  Administered 2017-06-10 (×2): 2 via ORAL
  Administered 2017-06-10: 1 via ORAL
  Filled 2017-06-09: qty 1
  Filled 2017-06-09 (×3): qty 2

## 2017-06-09 NOTE — Progress Notes (Signed)
OT Note - Addendum    06/09/17 1400  OT Visit Information  Last OT Received On 06/09/17  OT Time Calculation  OT Start Time (ACUTE ONLY) 1610  OT Stop Time (ACUTE ONLY) 0837  OT Time Calculation (min) 25 min  OT Evaluation  $OT Eval Low Complexity 1 Low  OT Treatments  $Self Care/Home Management  8-22 mins  [facility], OT/L  [phone] 06/09/2017

## 2017-06-09 NOTE — Therapy (Signed)
Occupational Therapy Evaluation Patient Details Name: [name] [name]. MRN: [id number] DOB: 1946/03/04 Today's Date: 06/09/2017    History of Present Illness 71 y.o. male with h/o spondylolisthesis and moderate canal stenosis; s/p L4-L5 decompressive laminectomy, decompression of L4 and L5 nerve roots, posterior lumbar interbody arthrodesis with peek spacers local autograft and allograft, pedicle screw fixation L4-L5, and posterior lateral arthrodesis L4-L5   Clinical Impression   PTA, pt reports being independent in all ADLs, IADLs, works full time, and [place]. Currently, pt requires min assist for LB ADLs, min guard for functional mobility with cane, and supervision/set up for all other ADLs. Pt reports living independently with intermittent assistance from neighbors and friends. Pt would benefit from acute OT services to increase independence with ADLs and promote a safe d/c home.     Follow Up Recommendations  No OT follow up;Supervision - Intermittent    Equipment Recommendations  3 in 1 bedside commode    Recommendations for Other Services       Precautions / Restrictions Precautions Precautions: Back;Fall Precaution Booklet Issued: Yes (comment) Precaution Comments: Educated on back precautions during ADL tasks and log roll technique.  Required Braces or Orthoses: Spinal Brace Spinal Brace: Lumbar corset;Applied in sitting position Restrictions Weight Bearing Restrictions: No      Mobility Bed Mobility               General bed mobility comments: Pt sitting in chair with brace on upon OT arrival. Reviewed log roll technique. Pt reports technique is difficult in hospital bed but thinks it will be better on his firm bed at home.  Transfers Overall transfer level: Needs assistance Equipment used: Rolling walker (2 wheeled) Transfers: Sit to/from Stand Sit to Stand: Supervision         General transfer comment: Pt demonstrated good technique  and maintained back precautions during transfer.     Balance Overall balance assessment: Needs assistance Sitting-balance support: Feet supported;No upper extremity supported Sitting balance-Leahy Scale: Good     Standing balance support: During functional activity;No upper extremity supported Standing balance-Leahy Scale: Good                             ADL either performed or assessed with clinical judgement   ADL Overall ADL's : Needs assistance/impaired     Grooming: Supervision/safety;Set up;Sitting   Upper Body Bathing: Supervision/ safety;Set up;Sitting   Lower Body Bathing: Minimal assistance;Sit to/from stand   Upper Body Dressing : Supervision/safety;Set up;Sitting   Lower Body Dressing: Minimal assistance;Sit to/from stand   Toilet Transfer: Min guard;Ambulation;Comfort height toilet   Toileting- Clothing Manipulation and Hygiene: Minimal assistance;Sit to/from stand   Tub/ Shower Transfer: Min guard;Ambulation;3 in 1;Grab bars   Functional mobility during ADLs: Min guard;Rolling walker;Cane General ADL Comments: Pt educated on compensatory techniques to complete ADLs with back precautions. Pt currently unable to bring feet over knees and may benefit from AE to increase independence.      Vision         Perception     Praxis      Pertinent Vitals/Pain Pain Assessment: 0-10 Pain Score: 4  Faces Pain Scale: Hurts a little bit Pain Location: back (incision) Pain Descriptors / Indicators: Discomfort;Aching;Sore Pain Intervention(s): Monitored during session;Limited activity within patient's tolerance     Hand Dominance     Extremity/Trunk Assessment Upper Extremity Assessment Upper Extremity Assessment: Overall WFL for tasks assessed  Lower Extremity Assessment Lower Extremity Assessment: Defer to PT evaluation   Cervical / Trunk Assessment Cervical / Trunk Assessment: Other exceptions Cervical / Trunk Exceptions: s/p lumbar  surgery   Communication Communication Communication: No difficulties   Cognition Arousal/Alertness: Awake/alert Behavior During Therapy: WFL for tasks assessed/performed Overall Cognitive Status: Within Functional Limits for tasks assessed                                 General Comments: cognition not formally assessed; [facility] for tasks   General Comments       Exercises     Shoulder Instructions      Home Living Family/patient expects to be discharged to:: Private residence Living Arrangements: Alone Available Help at Discharge: Personal care attendant;Family;Available PRN/intermittently Type of Home: House Home Access: Stairs to enter [employer] of Steps: 3 Entrance Stairs-Rails: Right;Left Home Layout: One level     Bathroom Shower/Tub: Chief Strategy Officer: Standard Bathroom Accessibility: Yes How Accessible: Accessible via walker Home Equipment: Cane - single point          Prior Functioning/Environment Level of Independence: Independent        Comments: Pt works full time Theme park manager. Lives [place] a [place].         OT Problem List: Decreased knowledge of use of DME or AE;Decreased knowledge of precautions;Decreased range of motion      OT Treatment/Interventions: Self-care/ADL training;DME and/or AE instruction;Therapeutic activities;Cognitive remediation/compensation;Patient/family education    OT Goals(Current goals can be found in the care plan section) Acute Rehab OT Goals Patient Stated Goal: To return to PLOF so he can get back to work OT Goal Formulation: With patient Time For Goal Achievement: 06/23/17 Potential to Achieve Goals: Good ADL Goals Pt Will Perform Lower Body Bathing: with modified independence;sit to/from stand (AE as needed) Pt Will Perform Lower Body Dressing: with modified independence;sit to/from stand (AE as needed) Pt Will Perform Toileting - Clothing Manipulation and  hygiene: with modified independence;sit to/from stand (AE as needed) Pt Will Perform Tub/Shower Transfer: with modified independence;ambulating;3 in 1;grab bars (Cane) Additional ADL Goal #1: Pt will independently verbalize 3/3 back precautions.   OT Frequency: Min 2X/week   Barriers to D/C:            Co-evaluation              AM-PAC PT "6 Clicks" Daily Activity     Outcome Measure Help from another person eating meals?: None Help from another person taking care of personal grooming?: None Help from another person toileting, which includes using toliet, bedpan, or urinal?: A Little Help from another person bathing (including washing, rinsing, drying)?: A Little Help from another person to put on and taking off regular upper body clothing?: None Help from another person to put on and taking off regular lower body clothing?: A Little 6 Click Score: 21   End of Session Equipment Utilized During Treatment: Gait belt;Rolling walker Best boy) Nurse Communication: Mobility status (need for 3 in 1 )  Activity Tolerance: Patient tolerated treatment well Patient left: in chair;with call bell/phone within reach (PT coming in for treatment)  OT Visit Diagnosis: Other abnormalities of gait and mobility (R26.89);Pain Pain - part of body:  (Back)                Time: 1610-9604 OT Time Calculation (min): 25 min Charges:    G-Codes:  Cammy Copa, [place] #[phone]   Cammy Copa 06/09/2017, 10:51 AM

## 2017-06-09 NOTE — Progress Notes (Signed)
Physical Therapy Treatment Patient Details Name: [name] [name]. MRN: [id number] DOB: 1946-08-13 Today's Date: 06/09/2017    History of Present Illness 71 y.o. male with h/o spondylolisthesis and moderate canal stenosis; s/p L4-L5 decompressive laminectomy, decompression of L4 and L5 nerve roots, posterior lumbar interbody arthrodesis with peek spacers local autograft and allograft, pedicle screw fixation L4-L5, and posterior lateral arthrodesis L4-L5    PT Comments    Pt requested PT see him again this date for further stair training and gait training with a SPC. Pt was able to negotiate stairs well with close supervision for safety. Noted occasional minor LOB during gait training with SPC however no assist was required to recover. Pt anticipates d/c home today. Will continue to follow and progress as able per POC.   Follow Up Recommendations  No PT follow up     Equipment Recommendations  None recommended by PT    Recommendations for Other Services       Precautions / Restrictions Precautions Precautions: Back;Fall Precaution Booklet Issued: Yes (comment) Precaution Comments: educated on surgical back precautions, log roll technique, and transfers Required Braces or Orthoses: Spinal Brace Spinal Brace: Lumbar corset;Applied in sitting position Restrictions Weight Bearing Restrictions: No    Mobility  Bed Mobility               General bed mobility comments: Pt received sitting up in chair  Transfers Overall transfer level: Needs assistance Equipment used: Rolling walker (2 wheeled) Transfers: Sit to/from Stand Sit to Stand: Supervision         General transfer comment: Pt demonstrated proper hand placement on seated surface for safety.   Ambulation/Gait Ambulation/Gait assistance: Supervision Ambulation Distance (Feet): 200 Feet Assistive device: Straight cane Gait Pattern/deviations: Step-through pattern;Decreased dorsiflexion - right;Drifts  right/left;Decreased step length - right Gait velocity: decreased Gait velocity interpretation: Below normal speed for age/gender General Gait Details: Slow and steady pace. Occasional mild LOB but no assist required to recover. SPC use per pt request   Stairs Stairs: Yes   Stair Management: One rail Left;Forwards;With cane Number of Stairs: 3 General stair comments: Practiced with the SPC. Pt was cued for sequencing and general safety.   Wheelchair Mobility    Modified Rankin (Stroke Patients Only)       Balance Overall balance assessment: Needs assistance Sitting-balance support: Feet supported;No upper extremity supported Sitting balance-Leahy Scale: Fair     Standing balance support: During functional activity;No upper extremity supported Standing balance-Leahy Scale: Good                              Cognition Arousal/Alertness: Awake/alert Behavior During Therapy: WFL for tasks assessed/performed Overall Cognitive Status: Within Functional Limits for tasks assessed                                 General Comments: cognition not formally assessed; [facility] for tasks      Exercises      General Comments        Pertinent Vitals/Pain Pain Assessment: Faces Faces Pain Scale: Hurts a little bit Pain Location: back (incision) Pain Descriptors / Indicators: Discomfort Pain Intervention(s): Limited activity within patient's tolerance;Monitored during session;Repositioned    Home Living                      Prior Function  PT Goals (current goals can now be found in the care plan section) Acute Rehab PT Goals PT Goal Formulation: With patient Time For Goal Achievement: 06/22/17 Potential to Achieve Goals: Good Progress towards PT goals: Progressing toward goals    Frequency    Min 5X/week      PT Plan Current plan remains appropriate    Co-evaluation              AM-PAC PT "6 Clicks" Daily  Activity  Outcome Measure  Difficulty turning over in bed (including adjusting bedclothes, sheets and blankets)?: Unable Difficulty moving from lying on back to sitting on the side of the bed? : Unable Difficulty sitting down on and standing up from a chair with arms (e.g., wheelchair, bedside commode, etc,.)?: A Little Help needed moving to and from a bed to chair (including a wheelchair)?: A Little Help needed walking in hospital room?: A Little Help needed climbing 3-5 steps with a railing? : A Lot 6 Click Score: 13    End of Session Equipment Utilized During Treatment: Gait belt;Back brace Activity Tolerance: Patient tolerated treatment well Patient left: in chair;with call bell/phone within reach Nurse Communication: Mobility status PT Visit Diagnosis: Difficulty in walking, not elsewhere classified (R26.2)     Time: 7829-5621 PT Time Calculation (min) (ACUTE ONLY): 25 min  Charges:  $Gait Training: 23-37 mins                    G Codes:       Conni Slipper, PT, DPT Acute Rehabilitation Services Pager: [phone]    Marylynn Pearson 06/09/2017, 9:10 AM

## 2017-06-09 NOTE — Progress Notes (Signed)
Vital signs are stable Motor function appears intact Postoperative hematocrit is 37 Electrolytes are good Renal function okay Dressing is clean and dry Patient is ambulating Continue to mobilize Plan discharge for tomorrow

## 2017-06-10 MED ORDER — OXYCODONE-ACETAMINOPHEN 5-325 MG PO TABS: 1.0000 | ORAL_TABLET | ORAL | 0 refills | Status: DC | PRN

## 2017-06-10 MED ORDER — METHOCARBAMOL 500 MG PO TABS: 500.0000 mg | ORAL_TABLET | Freq: Four times a day (QID) | ORAL | 3 refills | Status: DC | PRN

## 2017-06-10 MED ORDER — DEXAMETHASONE 1 MG PO TABS: ORAL_TABLET | ORAL | 0 refills | Status: AC

## 2017-06-10 NOTE — Therapy (Signed)
Occupational Therapy Treatment and Discharge Patient Details Name: [name] [name]. MRN: [id number] DOB: 02/06/1946 Today's Date: 06/10/2017    History of present illness 71 y.o. male with h/o spondylolisthesis and moderate canal stenosis; s/p L4-L5 decompressive laminectomy, decompression of L4 and L5 nerve roots, posterior lumbar interbody arthrodesis with peek spacers local autograft and allograft, pedicle screw fixation L4-L5, and posterior lateral arthrodesis L4-L5   OT comments  Focus of today's treatment session on AE to increase independence with LB ADLs and review technique for tub shower transfer. Pt able to complete LB dressing with min assist and is not interested in AE to increase independence. Pt able to complete tub shower transfer with supervision for safety. Pt reports son will be available to provide 24 hour assistance as needed. Pt has met all acute OT goals and is safe to d/c home.     Follow Up Recommendations  No OT follow up;Supervision - Intermittent    Equipment Recommendations  3 in 1 bedside commode    Recommendations for Other Services      Precautions / Restrictions Precautions Precautions: Back;Fall Precaution Booklet Issued: Yes (comment) Precaution Comments: Pt able to recall 3/3 back precautions. Required Braces or Orthoses: Spinal Brace Spinal Brace: Lumbar corset;Applied in sitting position Restrictions Weight Bearing Restrictions: No       Mobility Bed Mobility               General bed mobility comments: Pt standing in room upon arrival.  Transfers Overall transfer level: Needs assistance Equipment used: None Transfers: Sit to/from Stand Sit to Stand: Supervision         General transfer comment: Pt demonstrated good technique and maintained back precautions during transfer.     Balance Overall balance assessment: Needs assistance Sitting-balance support: Feet supported;No upper extremity supported Sitting balance-Leahy  Scale: Good Sitting balance - Comments: Pt able to sit and don socks using sock aide.   Standing balance support: During functional activity;No upper extremity supported Standing balance-Leahy Scale: Good                             ADL either performed or assessed with clinical judgement   ADL Overall ADL's : Needs assistance/impaired             Lower Body Bathing: Minimal assistance;Sit to/from stand Lower Body Bathing Details (indicate cue type and reason): Educated on long handled sponge.     Lower Body Dressing: Minimal assistance;Sit to/from stand Lower Body Dressing Details (indicate cue type and reason): Educated on use of sock aide and reacher.  Toilet Transfer: Supervision/safety;Set up;Ambulation (3 in 1 )       Tub/ Shower Transfer: Supervision/safety;Ambulation;Shower Scientist, research (medical) Details (indicate cue type and reason): Reviewed tub shower transfer technique.  Functional mobility during ADLs: Supervision/safety;Set up;Cane General ADL Comments: Pt educated on AE to increase independence with LB ADLs. Pt reports son is able to provide assistance with ADLs as needed.      Vision       Perception     Praxis      Cognition Arousal/Alertness: Awake/alert Behavior During Therapy: WFL for tasks assessed/performed Overall Cognitive Status: Within Functional Limits for tasks assessed                                          Exercises  Shoulder Instructions       General Comments Pt reports son will be living with him initially after d/c.     Pertinent Vitals/ Pain       Pain Assessment: No/denies pain Pain Score: 0-No pain  Home Living                                          Prior Functioning/Environment              Frequency  Min 2X/week        Progress Toward Goals  OT Goals(current goals can now be found in the care plan section)  Progress towards OT goals: Goals  met/education completed, patient discharged from OT  Acute Rehab OT Goals Patient Stated Goal: To go home  OT Goal Formulation: With patient Time For Goal Achievement: 06/23/17 Potential to Achieve Goals: Good  Plan All goals met and education completed, patient discharged from OT services    Co-evaluation                 AM-PAC PT "6 Clicks" Daily Activity     Outcome Measure   Help from another person eating meals?: None Help from another person taking care of personal grooming?: None Help from another person toileting, which includes using toliet, bedpan, or urinal?: None Help from another person bathing (including washing, rinsing, drying)?: A Little Help from another person to put on and taking off regular upper body clothing?: None Help from another person to put on and taking off regular lower body clothing?: A Little 6 Click Score: 22    End of Session Equipment Utilized During Treatment: Gait belt  OT Visit Diagnosis: Other abnormalities of gait and mobility (R26.89);Pain   Activity Tolerance Patient tolerated treatment well   Patient Left in chair;with call bell/phone within reach   Nurse Communication Mobility status        Time: 7561-2548 OT Time Calculation (min): 15 min  Charges: OT General Charges $OT Visit: 1 Visit OT Treatments $Self Care/Home Management : 8-22 mins  Boykin Peek, [place] #[phone]    Boykin Peek 06/10/2017, 10:11 AM

## 2017-06-10 NOTE — Progress Notes (Signed)
Vital signs are stable Motor function appears intact His incision is clean and dry. Is discharged home.

## 2017-06-10 NOTE — Discharge Summary (Signed)
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 Condition on discharge: Improved Hospital course: The patient was admitted to undergo surgical decompression at L4-L5. He tolerated surgery well. His postoperative pain is been well controlled with oxycodone with Tylenol. He has used some muscle relaxer in the form of Robaxin. He is given prescriptions for each of these. He is also given a Decadron taper for the next 5 days. He'll be seen in the office in 3 weeks' time.

## 2017-06-10 NOTE — Progress Notes (Signed)
Pt doing well. Pt given D/C instructions with Rx's, verbal understanding was provided. Pt's dressing was removed prior to D/C. Pt's incision is clean and dry with no sign of infection. Pt's IV was removed prior to D/C. Pt D/C'd home via wheelchair per MD order. Pt is stable @ D/C and has no other needs at this time. Rema Fendt, RN

## 2017-06-10 NOTE — Discharge Instructions (Signed)

## 2017-06-10 NOTE — Progress Notes (Signed)
Pt received 3-N-1 from [facility] prior to D/C. Rema Fendt, RN

## 2017-06-12 MED FILL — Heparin Sodium (Porcine) Inj 1000 Unit/ML: INTRAMUSCULAR | Qty: 30 | Status: AC

## 2017-06-12 MED FILL — Sodium Chloride IV Soln 0.9%: INTRAVENOUS | Qty: 1000 | Status: AC

## 2017-06-24 ENCOUNTER — Other Ambulatory Visit: Payer: Self-pay | Admitting: *Deleted

## 2017-06-24 DIAGNOSIS — M48062 Spinal stenosis, lumbar region with neurogenic claudication: Secondary | ICD-10-CM | POA: Diagnosis not present

## 2017-06-24 DIAGNOSIS — Z6825 Body mass index (BMI) 25.0-25.9, adult: Secondary | ICD-10-CM | POA: Diagnosis not present

## 2017-08-12 DIAGNOSIS — M48062 Spinal stenosis, lumbar region with neurogenic claudication: Secondary | ICD-10-CM | POA: Diagnosis not present

## 2017-08-17 ENCOUNTER — Other Ambulatory Visit: Payer: Medicare Other | Admitting: *Deleted

## 2017-08-17 DIAGNOSIS — I2510 Atherosclerotic heart disease of native coronary artery without angina pectoris: Secondary | ICD-10-CM | POA: Diagnosis not present

## 2017-08-17 DIAGNOSIS — E782 Mixed hyperlipidemia: Secondary | ICD-10-CM

## 2017-08-17 LAB — HEPATIC FUNCTION PANEL
ALT: 23 IU/L (ref 0–44)
AST: 18 IU/L (ref 0–40)
Albumin: 4 g/dL (ref 3.5–4.8)
Alkaline Phosphatase: 82 IU/L (ref 39–117)
BILIRUBIN TOTAL: 0.5 mg/dL (ref 0.0–1.2)
BILIRUBIN, DIRECT: 0.16 mg/dL (ref 0.00–0.40)
TOTAL PROTEIN: 6.3 g/dL (ref 6.0–8.5)

## 2017-08-17 LAB — BASIC METABOLIC PANEL
BUN/Creatinine Ratio: 15 (ref 10–24)
BUN: 15 mg/dL (ref 8–27)
CHLORIDE: 101 mmol/L (ref 96–106)
CO2: 24 mmol/L (ref 20–29)
Calcium: 9.2 mg/dL (ref 8.6–10.2)
Creatinine, Ser: 1.02 mg/dL (ref 0.76–1.27)
GFR calc Af Amer: 85 mL/min/{1.73_m2} (ref >59)
GFR calc non Af Amer: 74 mL/min/{1.73_m2} (ref >59)
Glucose: 89 mg/dL (ref 65–99)
Potassium: 4.6 mmol/L (ref 3.5–5.2)
Sodium: 139 mmol/L (ref 134–144)

## 2017-08-17 LAB — LIPID PANEL
Chol/HDL Ratio: 4.3 ratio (ref 0.0–5.0)
Cholesterol, Total: 132 mg/dL (ref 100–199)
HDL: 31 mg/dL — AB (ref >39)
LDL CALC: 80 mg/dL (ref 0–99)
Triglycerides: 105 mg/dL (ref 0–149)
VLDL CHOLESTEROL CAL: 21 mg/dL (ref 5–40)

## 2017-11-10 ENCOUNTER — Ambulatory Visit (INDEPENDENT_AMBULATORY_CARE_PROVIDER_SITE_OTHER): Payer: Medicare Other | Admitting: Cardiovascular Disease

## 2017-11-10 ENCOUNTER — Encounter: Payer: Self-pay | Admitting: Cardiovascular Disease

## 2017-11-10 VITALS — BP 118/76 | HR 77 | Ht 67.0 in | Wt 162.0 lb

## 2017-11-10 DIAGNOSIS — I2510 Atherosclerotic heart disease of native coronary artery without angina pectoris: Secondary | ICD-10-CM | POA: Diagnosis not present

## 2017-11-10 DIAGNOSIS — E782 Mixed hyperlipidemia: Secondary | ICD-10-CM | POA: Diagnosis not present
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
## 2017-11-10 NOTE — Patient Instructions (Addendum)

## 2017-12-15 ENCOUNTER — Encounter: Payer: Self-pay | Admitting: Family Medicine

## 2017-12-15 ENCOUNTER — Ambulatory Visit (INDEPENDENT_AMBULATORY_CARE_PROVIDER_SITE_OTHER): Payer: Medicare Other | Admitting: Family Medicine

## 2017-12-15 VITALS — BP 128/80 | HR 80 | Temp 97.1°F | Ht 67.0 in | Wt 164.0 lb

## 2017-12-15 DIAGNOSIS — R05 Cough: Secondary | ICD-10-CM

## 2017-12-15 DIAGNOSIS — R053 Chronic cough: Secondary | ICD-10-CM

## 2017-12-15 DIAGNOSIS — R21 Rash and other nonspecific skin eruption: Secondary | ICD-10-CM | POA: Diagnosis not present

## 2017-12-15 DIAGNOSIS — J9801 Acute bronchospasm: Secondary | ICD-10-CM

## 2017-12-15 MED ORDER — HYDROCORTISONE 0.5 % EX CREA: 1.0000 "application " | TOPICAL_CREAM | Freq: Two times a day (BID) | CUTANEOUS | 0 refills | Status: DC

## 2017-12-15 MED ORDER — BUDESONIDE-FORMOTEROL FUMARATE 80-4.5 MCG/ACT IN AERO: 2.0000 | INHALATION_SPRAY | Freq: Two times a day (BID) | RESPIRATORY_TRACT | 0 refills | Status: DC

## 2017-12-15 MED ORDER — IPRATROPIUM-ALBUTEROL 0.5-2.5 (3) MG/3ML IN SOLN
3.0000 mL | Freq: Once | RESPIRATORY_TRACT | Status: AC
  Administered 2017-12-15: 3 mL via RESPIRATORY_TRACT

## 2017-12-15 NOTE — Progress Notes (Signed)
Subjective: CC: chronic cough PCP: [name] [name], [name], MD ZOX:WRUEAVHPI:[name] J Petra KubaHopper Jr. is a 72 y.o. male presenting to clinic today for:  1. Cough Patient reports that he has had a nagging, productive cough since May of last year.  He notes that he seen his PCP x2 for this cough.  He notes that the cough seemed to get better with treatment last fall but returned several weeks later.  He denies hemoptysis, fevers, chills.  He sometimes has shortness of breath with exertion.  He does feel like he is wheezing.  Phlegm production seems to be more prominent early in the morning and late at night.  Denies history of COPD or asthma.  He is a former smoker many years ago.  He does note that he is exposed to secondhand smoke but typically has a his family and friends to smoke smoke outside.  Denies postnasal drip, rhinorrhea, itchy or watery eyes.  Not currently on an oral antihistamine.  Has never had a trial of PPI.  Denies any GERD symptoms.  2. Rash Patient reports a rash on his abdomen on the right that developed seemingly with the start of Crestor.  He notes that he had similar rash on bilateral lower extremities but this resolved.  He notes that the rash is itchy.  Denies any new lotions, soaps, detergents.  He notes in fact he actually changed his soap to [place] in efforts to improve rash.  He also changed his detergent from gain to a hypoallergenic detergent.  Rash is not improved.  It does not seem to be spreading.  ROS: Per HPI  Allergies  Allergen Reactions  . Lipitor [Atorvastatin] Other (See Comments)    "Makes pt feel funky"   Past Medical History:  Diagnosis Date  . Erectile dysfunction   . History of hiatal hernia    a lone time ago  . Hyperlipidemia   . Myocardial infarction (HCC)    2x  . Polio     Current Outpatient Medications:  .  aspirin 81 MG chewable tablet, Chew 81 mg by mouth daily., Disp: , Rfl:  .  diazepam (VALIUM) 5 MG tablet, Take 5 mg by mouth every 6 (six) hours as  needed for anxiety., Disp: , Rfl:  .  methocarbamol (ROBAXIN) 500 MG tablet, Take 1 tablet (500 mg total) by mouth every 6 (six) hours as needed for muscle spasms., Disp: 40 tablet, Rfl: 3 .  sildenafil (REVATIO) 20 MG tablet, Take 1 tablet (20 mg total) by mouth daily as needed (2-5 as needed)., Disp: 50 tablet, Rfl: 5 .  traMADol (ULTRAM) 50 MG tablet, Take 50 mg by mouth every 12 (twelve) hours as needed., Disp: , Rfl:  .  rosuvastatin (CRESTOR) 10 MG tablet, Take 1 tablet (10 mg total) by mouth daily., Disp: 90 tablet, Rfl: 3 Social History   Socioeconomic History  . Marital status: Divorced    Spouse name: Not on file  . Number of children: Not on file  . Years of education: Not on file  . Highest education level: Not on file  Occupational History  . Not on file  Social Needs  . Financial resource strain: Not on file  . Food insecurity:    Worry: Not on file    Inability: Not on file  . Transportation needs:    Medical: Not on file    Non-medical: Not on file  Tobacco Use  . Smoking status: Former Games developermoker  . Smokeless tobacco: Never Used  Substance  and Sexual Activity  . Alcohol use: Yes    Alcohol/week: 12.6 oz    Types: 21 Cans of beer per week  . Drug use: No  . Sexual activity: Not on file  Lifestyle  . Physical activity:    Days per week: Not on file    Minutes per session: Not on file  . Stress: Not on file  Relationships  . Social connections:    Talks on phone: Not on file    Gets together: Not on file    Attends religious service: Not on file    Active member of club or organization: Not on file    Attends meetings of clubs or organizations: Not on file    Relationship status: Not on file  . Intimate partner violence:    Fear of current or ex partner: Not on file    Emotionally abused: Not on file    Physically abused: Not on file    Forced sexual activity: Not on file  Other Topics Concern  . Not on file  Social History Narrative  . Not on file    Family History  Problem Relation Age of Onset  . Diabetes Mother   . Heart disease Mother   . Heart disease Father     Objective: Office vital signs reviewed. BP 128/80   Pulse 80   Temp (!) 97.1 F (36.2 C) (Oral)   Ht 5\' 7"  (1.702 m)   Wt 164 lb (74.4 kg)   BMI 25.69 kg/m   Physical Examination:  General: Awake, alert, well nourished, nonotoxic, No acute distress HEENT: Normal    Eyes: PERRLA, extraocular membranes intact, sclera white    Nose: nasal turbinates moist, no nasal discharge    Throat: moist mucus membranes.  Airway is patent Cardio: regular rate and rhythm, S1S2 heard, no murmurs appreciated Pulm: Coarse breath sounds that appear to be a mix of wheezes and rhonchi appreciated with exhalation.  These are intermittent and do not clear with coughing.  No rales; normal work of breathing on room air Skin: Excoriated, minimally erythematous maculopapular rash appreciated on the right upper abdomen.  No palpable fluctuance or induration.  No exudate or bleeding noted.  Assessment/ Plan: 72 y.o. male   1. Chronic cough I reviewed his September 2018 note and chest x-ray.  His physical exam was notable for coarse breath sounds throughout that did not clear with coughing.  He had normal respiratory effort on room air.  His pulmonary exam actually improved after DuoNeb here in office.  For this reason, I have given him a trial of Symbicort 80 mg to use 2 puffs twice a day for the next 2 weeks.  Instructions for use reviewed and inhaler demonstrated.  He will follow-up in 2 weeks for reevaluation.  If symptoms improve with Symbicort, would consider referring for formal pulmonary function testing.  If symptoms do not improve with Symbicort would obtain chest x-ray and then consider PPI trial for chronic cough.  He does not exhibit infective symptoms.  Therefore no antibiotics were prescribed today.  2. Bronchospasm I suspect the chronic cough is likely secondary to  bronchospasm.  He is to use a warm humidifier for the next couple of weeks.  Symbicort as above. - ipratropium-albuterol (DUONEB) 0.5-2.5 (3) MG/3ML nebulizer solution 3 mL  3. Rash and nonspecific skin eruption Appears to be a contact/allergic dermatitis of some sort.  No known exposures.  Will treat with topical corticosteroid twice daily for the next week.  Follow-up in 2 weeks for reevaluation.   Meds ordered this encounter  Medications  . ipratropium-albuterol (DUONEB) 0.5-2.5 (3) MG/3ML nebulizer solution 3 mL  . hydrocortisone cream 0.5 %    Sig: Apply 1 application topically 2 (two) times daily. x7-10 days    Dispense:  30 g    Refill:  0     Iyanla Eilers Hulen Skains, DO [facility] [phone]

## 2017-12-15 NOTE — Patient Instructions (Signed)
I am putting you on a trial of Symbicort.  I want to use 2 puffs twice a day for the next 2 weeks.  Follow-up in 2 weeks with Dr. Darlyn ReadStacks or myself for recheck.  If your symptoms are well controlled on the inhaler and humidification, we may consider sending you to a lung specialist to have formal lung function testing.  If your symptoms are not substantially improving, we will plan to do the trial of the proton pump inhibitor (acid reflux medication).   Bronchospasm, Adult Bronchospasm is when airways in the lungs get smaller. When this happens, it can be hard to breathe. You may cough. You may also make a whistling sound when you breathe (wheeze). Follow these instructions at home: Medicines  Take over-the-counter and prescription medicines only as told by your doctor.  If you need to use an inhaler or nebulizer to take your medicine, ask your doctor how to use it.  If you were given a spacer, always use it with your inhaler. Lifestyle  Change your heating and air conditioning filter. Do this at least once a month.  Try not to use fireplaces and wood stoves.  Do not  smoke. Do not  allow smoking in your home.  Try not to use things that have a strong smell, like perfume.  Get rid of pests (such as roaches and mice) and their poop.  Remove any mold from your home.  Keep your house clean. Get rid of dust.  Use cleaning products that have no smell.  Replace carpet with wood, tile, or vinyl flooring.  Use allergy-proof pillows, mattress covers, and box spring covers.  Wash bed sheets and blankets every week. Use hot water. Dry them in a dryer.  Use blankets that are made of polyester or cotton.  Wash your hands often.  Keep pets out of your bedroom.  When you exercise, try not to breathe in cold air. General instructions  Have a plan for getting medical care. Know these things: ? When to call your doctor. ? When to call local emergency services (911 in the [place] ? Where  to go in an emergency.  Stay up to date on your shots (immunizations).  When you have an episode: ? Stay calm. ? Relax. ? Breathe slowly. Contact a doctor if:  Your muscles ache.  Your chest hurts.  The color of the mucus you cough up (sputum) changes from clear or white to yellow, green, gray, or bloody.  The mucus you cough up gets thicker.  You have a fever. Get help right away if:  The whistling sound gets worse, even after you take your medicines.  Your coughing gets worse.  You find it even harder to breathe.  Your chest hurts very much. Summary  Bronchospasm is when airways in the lungs get smaller.  When this happens, it can be hard to breathe. You may cough. You may also make a whistling sound when you breathe.  Stay away from things that cause you to have episodes. These include smoke or dust. This information is not intended to replace advice given to you by your health care provider. Make sure you discuss any questions you have with your health care provider. Document Released: 06/29/2009 Document Revised: 09/04/2016 Document Reviewed: 09/04/2016 Elsevier Interactive Patient Education  2017 [employer]

## 2017-12-30 ENCOUNTER — Ambulatory Visit (INDEPENDENT_AMBULATORY_CARE_PROVIDER_SITE_OTHER): Payer: Medicare Other | Admitting: Family Medicine

## 2017-12-30 ENCOUNTER — Encounter: Payer: Self-pay | Admitting: Family Medicine

## 2017-12-30 ENCOUNTER — Telehealth: Payer: Self-pay

## 2017-12-30 VITALS — BP 133/84 | HR 78 | Temp 97.0°F | Ht 67.0 in | Wt 160.1 lb

## 2017-12-30 DIAGNOSIS — I2510 Atherosclerotic heart disease of native coronary artery without angina pectoris: Secondary | ICD-10-CM

## 2017-12-30 DIAGNOSIS — R05 Cough: Secondary | ICD-10-CM

## 2017-12-30 DIAGNOSIS — R053 Chronic cough: Secondary | ICD-10-CM

## 2017-12-30 NOTE — Progress Notes (Signed)
Chief Complaint  Patient presents with  . Follow-up    pt here today for 2 week recheck of his "hacky cough" which hasn't gotten any better    HPI  Patient presents today for continued cough.  Patient states this started in June of last year.  It was treated twice last year by me and kept coming back each time.  He had some relief from levofloxacin dosed last year just before his back surgery.  It came back shortly after the surgery however.  He came in to see Dr. Nadine Counts 2 weeks ago.  She prescribed Symbicort inhaler for him.  He has been using 1 puff twice daily.  Review of the chart shows that she had recommended 2 puffs twice daily as per package insert directions.  But patient misunderstood and took only 1 puff twice daily.  His cough has not improved.  Chart review reveals normal chest x-ray and normal pulmonary function test done during the course of this cough when he was here a few months ago.  He primarily coughs in the morning and at night.  It is productive of moderate amounts of mucoid sputum.  He was a longtime smoker.  Patient quit smoking several years ago.  History today reveals approximately 50-pack-year history.  PMH: Smoking status noted Current Outpatient Medications on File Prior to Visit  Medication Sig Dispense Refill  . aspirin 81 MG chewable tablet Chew 81 mg by mouth daily.    . budesonide-formoterol (SYMBICORT) 80-4.5 MCG/ACT inhaler Inhale 2 puffs into the lungs 2 (two) times daily. 1 Inhaler 0  . diazepam (VALIUM) 5 MG tablet Take 5 mg by mouth every 6 (six) hours as needed for anxiety.    . hydrocortisone cream 0.5 % Apply 1 application topically 2 (two) times daily. x7-10 days 30 g 0  . methocarbamol (ROBAXIN) 500 MG tablet Take 1 tablet (500 mg total) by mouth every 6 (six) hours as needed for muscle spasms. 40 tablet 3  . rosuvastatin (CRESTOR) 10 MG tablet Take 1 tablet (10 mg total) by mouth daily. 90 tablet 3  . sildenafil (REVATIO) 20 MG tablet Take 1  tablet (20 mg total) by mouth daily as needed (2-5 as needed). 50 tablet 5  . traMADol (ULTRAM) 50 MG tablet Take 50 mg by mouth every 12 (twelve) hours as needed.     No current facility-administered medications on file prior to visit.     ROS: Review of Systems  Constitutional: Negative for fever.  Respiratory: Positive for cough. Negative for shortness of breath.   Cardiovascular: Negative for chest pain.  Endocrine: Negative.   Musculoskeletal: Negative for arthralgias.  Skin: Negative for rash.  Psychiatric/Behavioral: Negative.      Objective: BP 133/84   Pulse 78   Temp (!) 97 F (36.1 C) (Oral)   Ht 5\' 7"  (1.702 m)   Wt 160 lb 2 oz (72.6 kg)   BMI 25.08 kg/m  Gen: NAD, alert, cooperative with exam HEENT: NCAT, EOMI, PERRL CV: RRR, good S1/S2, no murmur Resp: CTABL, scattered wheezes, inspiratory and expiratory.  Breath sounds non-labored Abd: SNTND, BS present, no guarding or organomegaly Ext: No edema, warm Neuro: Alert and oriented, No gross deficits  Assessment and plan:  1. Chronic cough     No orders of the defined types were placed in this encounter.   Orders Placed This Encounter  Procedures  . CT CHEST W WO CONTRAST    Standing Status:   Future    Standing  Expiration Date:   03/02/2019    Order Specific Question:   If indicated for the ordered procedure, I authorize the administration of contrast media per Radiology protocol    Answer:   Yes    Order Specific Question:   Reason for Exam (SYMPTOM  OR DIAGNOSIS REQUIRED)    Answer:   Chronic cough    Order Specific Question:   Preferred imaging location?    Answer:   [facility]    Order Specific Question:   Radiology Contrast Protocol - do NOT remove file path    Answer:   [email or web address]  . Ambulatory referral to Pulmonology    Referral Priority:   Routine    Referral Type:   Consultation    Referral Reason:   Specialty Services Required    Requested  Specialty:   Pulmonary Disease    Number of Visits Requested:   1   Allergies as of 12/30/2017      Reactions   Lipitor [atorvastatin] Other (See Comments)   "Makes pt feel funky"      Medication List        Accurate as of 12/30/17 11:47 AM. Always use your most recent med list.          aspirin 81 MG chewable tablet Chew 81 mg by mouth daily.   budesonide-formoterol 80-4.5 MCG/ACT inhaler Commonly known as:  SYMBICORT Inhale 2 puffs into the lungs 2 (two) times daily.   diazepam 5 MG tablet Commonly known as:  VALIUM Take 5 mg by mouth every 6 (six) hours as needed for anxiety.   hydrocortisone cream 0.5 % Apply 1 application topically 2 (two) times daily. x7-10 days   methocarbamol 500 MG tablet Commonly known as:  ROBAXIN Take 1 tablet (500 mg total) by mouth every 6 (six) hours as needed for muscle spasms.   rosuvastatin 10 MG tablet Commonly known as:  CRESTOR Take 1 tablet (10 mg total) by mouth daily.   sildenafil 20 MG tablet Commonly known as:  REVATIO Take 1 tablet (20 mg total) by mouth daily as needed (2-5 as needed).   traMADol 50 MG tablet Commonly known as:  ULTRAM Take 50 mg by mouth every 12 (twelve) hours as needed.       Follow up 6 mos & prn Mechele ClaudeWarren Ellenora Talton, MD

## 2017-12-30 NOTE — Telephone Encounter (Signed)
Need order put in for stat BUN creat for when patient has CT on 01/18/18   Unless patient wants to come in here prior to get done  They need it for the contrast  He had one greater than 6 weeks

## 2017-12-31 ENCOUNTER — Other Ambulatory Visit: Payer: Self-pay | Admitting: Family Medicine

## 2017-12-31 DIAGNOSIS — I25118 Atherosclerotic heart disease of native coronary artery with other forms of angina pectoris: Secondary | ICD-10-CM

## 2018-01-06 DIAGNOSIS — M48062 Spinal stenosis, lumbar region with neurogenic claudication: Secondary | ICD-10-CM | POA: Diagnosis not present

## 2018-01-06 DIAGNOSIS — R030 Elevated blood-pressure reading, without diagnosis of hypertension: Secondary | ICD-10-CM | POA: Diagnosis not present

## 2018-01-18 ENCOUNTER — Ambulatory Visit (HOSPITAL_COMMUNITY): Admission: RE | Admit: 2018-01-18 | Payer: Medicare Other | Source: Ambulatory Visit

## 2018-02-19 ENCOUNTER — Encounter: Payer: Self-pay | Admitting: *Deleted

## 2018-04-01 DIAGNOSIS — J40 Bronchitis, not specified as acute or chronic: Secondary | ICD-10-CM | POA: Diagnosis not present

## 2018-05-26 ENCOUNTER — Other Ambulatory Visit: Payer: Self-pay | Admitting: Family Medicine

## 2018-05-27 NOTE — Telephone Encounter (Signed)
Last seen 12/30/17 

## 2018-07-02 ENCOUNTER — Ambulatory Visit: Payer: Medicare Other | Admitting: Family Medicine

## 2018-07-05 ENCOUNTER — Ambulatory Visit: Payer: Medicare Other | Admitting: Family Medicine

## 2018-07-07 ENCOUNTER — Encounter: Payer: Self-pay | Admitting: Family Medicine
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
## 2018-11-14 ENCOUNTER — Other Ambulatory Visit: Payer: Self-pay

## 2018-11-14 ENCOUNTER — Emergency Department (HOSPITAL_COMMUNITY): Payer: Medicare Other

## 2018-11-14 ENCOUNTER — Inpatient Hospital Stay (HOSPITAL_COMMUNITY)
Admission: EM | Admit: 2018-11-14 | Discharge: 2018-11-16 | DRG: 282 | Disposition: A | Discharge status: Home / Self Care | Payer: Medicare Other | Attending: Family Medicine | Admitting: Family Medicine

## 2018-11-14 DIAGNOSIS — Z888 Allergy status to other drugs, medicaments and biological substances status: Secondary | ICD-10-CM

## 2018-11-14 DIAGNOSIS — E782 Mixed hyperlipidemia: Secondary | ICD-10-CM | POA: Diagnosis present

## 2018-11-14 DIAGNOSIS — Z7982 Long term (current) use of aspirin: Secondary | ICD-10-CM

## 2018-11-14 DIAGNOSIS — I2510 Atherosclerotic heart disease of native coronary artery without angina pectoris: Secondary | ICD-10-CM | POA: Diagnosis not present

## 2018-11-14 DIAGNOSIS — Z8249 Family history of ischemic heart disease and other diseases of the circulatory system: Secondary | ICD-10-CM

## 2018-11-14 DIAGNOSIS — T82855A Stenosis of coronary artery stent, initial encounter: Secondary | ICD-10-CM | POA: Diagnosis not present

## 2018-11-14 DIAGNOSIS — T50996A Underdosing of other drugs, medicaments and biological substances, initial encounter: Secondary | ICD-10-CM | POA: Diagnosis not present

## 2018-11-14 DIAGNOSIS — I214 Non-ST elevation (NSTEMI) myocardial infarction: Secondary | ICD-10-CM | POA: Diagnosis not present

## 2018-11-14 DIAGNOSIS — N529 Male erectile dysfunction, unspecified: Secondary | ICD-10-CM | POA: Diagnosis present

## 2018-11-14 DIAGNOSIS — I493 Ventricular premature depolarization: Secondary | ICD-10-CM | POA: Diagnosis present

## 2018-11-14 DIAGNOSIS — I252 Old myocardial infarction: Secondary | ICD-10-CM

## 2018-11-14 DIAGNOSIS — Z955 Presence of coronary angioplasty implant and graft: Secondary | ICD-10-CM

## 2018-11-14 DIAGNOSIS — R0602 Shortness of breath: Secondary | ICD-10-CM | POA: Diagnosis not present

## 2018-11-14 DIAGNOSIS — E785 Hyperlipidemia, unspecified: Secondary | ICD-10-CM

## 2018-11-14 DIAGNOSIS — I2584 Coronary atherosclerosis due to calcified coronary lesion: Secondary | ICD-10-CM | POA: Diagnosis present

## 2018-11-14 DIAGNOSIS — R079 Chest pain, unspecified: Secondary | ICD-10-CM | POA: Diagnosis not present

## 2018-11-14 DIAGNOSIS — R000 Tachycardia, unspecified: Secondary | ICD-10-CM | POA: Diagnosis not present

## 2018-11-14 DIAGNOSIS — Z87891 Personal history of nicotine dependence: Secondary | ICD-10-CM

## 2018-11-14 DIAGNOSIS — Z833 Family history of diabetes mellitus: Secondary | ICD-10-CM

## 2018-11-14 DIAGNOSIS — Z9861 Coronary angioplasty status: Secondary | ICD-10-CM

## 2018-11-14 DIAGNOSIS — I10 Essential (primary) hypertension: Secondary | ICD-10-CM | POA: Diagnosis present

## 2018-11-14 DIAGNOSIS — Z8612 Personal history of poliomyelitis: Secondary | ICD-10-CM

## 2018-11-14 DIAGNOSIS — R202 Paresthesia of skin: Secondary | ICD-10-CM | POA: Diagnosis not present

## 2018-11-14 DIAGNOSIS — R0902 Hypoxemia: Secondary | ICD-10-CM | POA: Diagnosis not present

## 2018-11-14 HISTORY — DX: Atherosclerotic heart disease of native coronary artery without angina pectoris: I25.10

## 2018-11-14 LAB — TROPONIN I
Troponin I: 0.11 ng/mL (ref <0.03)
Troponin I: 1.27 ng/mL (ref <0.03)

## 2018-11-14 LAB — CBC
HCT: 46.3 % (ref 39.0–52.0)
Hemoglobin: 15.3 g/dL (ref 13.0–17.0)
MCH: 29.9 pg (ref 26.0–34.0)
MCHC: 33 g/dL (ref 30.0–36.0)
MCV: 90.4 fL (ref 80.0–100.0)
PLATELETS: 164 10*3/uL (ref 150–400)
RBC: 5.12 MIL/uL (ref 4.22–5.81)
RDW: 13 % (ref 11.5–15.5)
WBC: 8.5 10*3/uL (ref 4.0–10.5)
nRBC: 0 % (ref 0.0–0.2)

## 2018-11-14 LAB — COMPREHENSIVE METABOLIC PANEL
ALT: 25 U/L (ref 0–44)
AST: 22 U/L (ref 15–41)
Albumin: 3.5 g/dL (ref 3.5–5.0)
Alkaline Phosphatase: 60 U/L (ref 38–126)
Anion gap: 7 (ref 5–15)
BUN: 18 mg/dL (ref 8–23)
CHLORIDE: 106 mmol/L (ref 98–111)
CO2: 25 mmol/L (ref 22–32)
Calcium: 8.9 mg/dL (ref 8.9–10.3)
Creatinine, Ser: 0.96 mg/dL (ref 0.61–1.24)
GFR calc Af Amer: >60 mL/min (ref >60)
GFR calc non Af Amer: >60 mL/min (ref >60)
Glucose, Bld: 116 mg/dL — ABNORMAL HIGH (ref 70–99)
Potassium: 4.3 mmol/L (ref 3.5–5.1)
Sodium: 138 mmol/L (ref 135–145)
Total Bilirubin: 0.9 mg/dL (ref 0.3–1.2)
Total Protein: 6.4 g/dL — ABNORMAL LOW (ref 6.5–8.1)

## 2018-11-14 LAB — TSH: TSH: 2.098 u[IU]/mL (ref 0.350–4.500)

## 2018-11-14 LAB — I-STAT TROPONIN, ED: Troponin i, poc: 0.02 ng/mL (ref 0.00–0.08)

## 2018-11-14 LAB — LIPASE, BLOOD: Lipase: 38 U/L (ref 11–51)

## 2018-11-14 LAB — GLUCOSE, CAPILLARY: Glucose-Capillary: 150 mg/dL — ABNORMAL HIGH (ref 70–99)

## 2018-11-14 MED ORDER — HEPARIN BOLUS VIA INFUSION
4000.0000 [IU] | Freq: Once | INTRAVENOUS | Status: AC
  Administered 2018-11-14: 4000 [IU] via INTRAVENOUS
  Filled 2018-11-14: qty 4000

## 2018-11-14 MED ORDER — HEPARIN (PORCINE) 25000 UT/250ML-% IV SOLN
1000.0000 [IU]/h | INTRAVENOUS | Status: DC
  Administered 2018-11-14: 850 [IU]/h via INTRAVENOUS
  Filled 2018-11-14: qty 250

## 2018-11-14 MED ORDER — CARVEDILOL 3.125 MG PO TABS
3.1250 mg | ORAL_TABLET | Freq: Two times a day (BID) | ORAL | Status: DC
  Administered 2018-11-15 – 2018-11-16 (×3): 3.125 mg via ORAL
  Filled 2018-11-14 (×3): qty 1

## 2018-11-14 MED ORDER — NITROGLYCERIN 0.4 MG SL SUBL
SUBLINGUAL_TABLET | SUBLINGUAL | Status: AC
  Administered 2018-11-14: 0.4 mg
  Filled 2018-11-14: qty 1

## 2018-11-14 MED ORDER — ASPIRIN EC 81 MG PO TBEC: 81.0000 mg | DELAYED_RELEASE_TABLET | Freq: Every day | ORAL | Status: DC

## 2018-11-14 MED ORDER — SIMVASTATIN 20 MG PO TABS
10.0000 mg | ORAL_TABLET | Freq: Every day | ORAL | Status: DC
  Administered 2018-11-14: 10 mg via ORAL
  Filled 2018-11-14: qty 1

## 2018-11-14 MED ORDER — LOSARTAN POTASSIUM 50 MG PO TABS
25.0000 mg | ORAL_TABLET | Freq: Every day | ORAL | Status: DC
  Administered 2018-11-15 – 2018-11-16 (×2): 25 mg via ORAL
  Filled 2018-11-14 (×3): qty 1

## 2018-11-14 MED ORDER — ENOXAPARIN SODIUM 40 MG/0.4ML ~~LOC~~ SOLN: 40.0000 mg | SUBCUTANEOUS | Status: DC

## 2018-11-14 MED ORDER — ASPIRIN EC 325 MG PO TBEC: 325.0000 mg | DELAYED_RELEASE_TABLET | Freq: Every day | ORAL | Status: DC

## 2018-11-14 MED ORDER — ACETAMINOPHEN 325 MG PO TABS: 650.0000 mg | ORAL_TABLET | ORAL | Status: DC | PRN

## 2018-11-14 MED ORDER — ASPIRIN 81 MG PO CHEW: 324.0000 mg | CHEWABLE_TABLET | Freq: Once | ORAL | Status: DC

## 2018-11-14 MED ORDER — ONDANSETRON HCL 4 MG/2ML IJ SOLN
4.0000 mg | Freq: Four times a day (QID) | INTRAMUSCULAR | Status: DC | PRN
  Administered 2018-11-15: 4 mg via INTRAVENOUS
  Filled 2018-11-14: qty 2

## 2018-11-14 NOTE — Progress Notes (Signed)
Patient arrived to 5C15 from ED at this time. Safety precautions and orders reviewed. TELE applied and monitor. Patient denied pain. No other distress noted. Will continue to monitor.   Hav, RN

## 2018-11-14 NOTE — Progress Notes (Signed)
Received a critical troponin level of 1.27 from 0.11. Patient had a pain level of 2/10. EKG done and nitroglycerine 0.4mg  given. MD paged and rapid response  called to assist in caring for the patient. Patient felt better after the nitroglycerine given. Vital signs stable and patient pending to be transferred out to cardiac telemetry bed for monitoring.

## 2018-11-14 NOTE — ED Provider Notes (Signed)
[facility] EMERGENCY DEPARTMENT Provider Note   CSN: [id number] Arrival date & time: 11/14/18  1150    History   Chief Complaint Chief Complaint  Patient presents with  . Chest Pain    HPI [name] [name]. is a 73 y.o. male.     The history is provided by the patient.  Chest Pain  Pain location:  Substernal area Pain quality: aching and dull   Pain radiates to:  L arm Pain severity:  Severe Onset quality:  Sudden Timing:  Rare Progression:  Resolved Chronicity:  New Context: at rest   Relieved by:  Nitroglycerin and aspirin Worsened by:  Nothing Ineffective treatments:  None tried Associated symptoms: no abdominal pain, no altered mental status, no anorexia, no anxiety, no back pain, no cough, no dizziness, no fever, no numbness, no orthopnea, no palpitations, no shortness of breath, no syncope and no vomiting   Risk factors: coronary artery disease, high cholesterol, hypertension and male sex     Past Medical History:  Diagnosis Date  . Erectile dysfunction   . History of hiatal hernia    a lone time ago  . Hyperlipidemia   . Myocardial infarction (HCC)    2x  . Polio     Patient Active Problem List   Diagnosis Date Noted  . Spondylolisthesis at L4-L5 level 06/08/2017  . Coronary artery disease involving native coronary artery of native heart without angina pectoris 05/08/2017  . Mixed hyperlipidemia 05/08/2017  . Erectile dysfunction 03/29/2013  . HYPERCHOLESTEROLEMIA  IIA 12/11/2008  . Coronary atherosclerosis 12/11/2008    Past Surgical History:  Procedure Laterality Date  . APPENDECTOMY    . CARDIAC CATHETERIZATION     has two stents placed  . CERVICAL SPINE SURGERY     c3-4, 4-5,  5-6, 8 screws and plates  . CORONARY STENT PLACEMENT    . forein body removal     metal sharp removed from right leg        Home Medications    Prior to Admission medications   Medication Sig Start Date End Date Taking? Authorizing  Provider  aspirin 81 MG chewable tablet Chew 81 mg by mouth daily.   Yes [provider]  budesonide-formoterol (SYMBICORT) 80-4.5 MCG/ACT inhaler Inhale 2 puffs into the lungs 2 (two) times daily. Patient not taking: Reported on 11/14/2018 12/15/17   Raliegh Ip, DO  hydrocortisone cream 0.5 % Apply 1 application topically 2 (two) times daily. x7-10 days Patient not taking: Reported on 11/14/2018 12/15/17   Raliegh Ip, DO  methocarbamol (ROBAXIN) 500 MG tablet Take 1 tablet (500 mg total) by mouth every 6 (six) hours as needed for muscle spasms. Patient not taking: Reported on 11/14/2018 06/10/17   Barnett Abu, MD  rosuvastatin (CRESTOR) 10 MG tablet Take 1 tablet (10 mg total) by mouth daily. Patient not taking: Reported on 11/14/2018 05/08/17 12/30/17  Nahser, Deloris Ping, MD  sildenafil (REVATIO) 20 MG tablet TAKE 2 -5 TABLETS BY MOUTH DAILY AS NEEDED Patient not taking: No sig reported 05/28/18   Mechele Claude, MD    Family History Family History  Problem Relation Age of Onset  . Diabetes Mother   . Heart disease Mother   . Heart disease Father     Social History Social History   Tobacco Use  . Smoking status: Former Games developer  . Smokeless tobacco: Never Used  Substance Use Topics  . Alcohol use: Yes    Alcohol/week: 21.0 standard drinks  Types: 21 Cans of beer per week  . Drug use: No     Allergies   Lipitor [atorvastatin]   Review of Systems Review of Systems  Constitutional: Negative for chills and fever.  HENT: Negative for ear pain and sore throat.   Eyes: Negative for pain and visual disturbance.  Respiratory: Negative for cough and shortness of breath.   Cardiovascular: Positive for chest pain. Negative for palpitations, orthopnea and syncope.  Gastrointestinal: Negative for abdominal pain, anorexia and vomiting.  Genitourinary: Negative for dysuria and hematuria.  Musculoskeletal: Negative for arthralgias and back pain.  Skin: Negative for color  change and rash.  Neurological: Negative for dizziness, seizures, syncope and numbness.  All other systems reviewed and are negative.    Physical Exam Updated Vital Signs  ED Triage Vitals  Enc Vitals Group     BP 11/14/18 1158 (!) 144/75     Pulse Rate 11/14/18 1158 66     Resp 11/14/18 1158 18     Temp 11/14/18 1158 97.6 F (36.4 C)     Temp Source 11/14/18 1158 Oral     SpO2 11/14/18 1158 99 %     Weight 11/14/18 1159 160 lb (72.6 kg)     Height 11/14/18 1159  (1.676 m)     Head Circumference --      Peak Flow --      Pain Score 11/14/18 1159 2     Pain Loc --      Pain Edu? --      Excl. in GC? --     Physical Exam Vitals signs and nursing note reviewed.  Constitutional:      General: He is not in acute distress.    Appearance: He is well-developed. He is not ill-appearing.  HENT:     Head: Normocephalic and atraumatic.  Eyes:     Conjunctiva/sclera: Conjunctivae normal.  Neck:     Musculoskeletal: Neck supple.  Cardiovascular:     Rate and Rhythm: Normal rate and regular rhythm.     Pulses:          Radial pulses are 2+ on the right side and 2+ on the left side.       Dorsalis pedis pulses are 2+ on the right side and 2+ on the left side.     Heart sounds: Normal heart sounds. No murmur.  Pulmonary:     Effort: Pulmonary effort is normal. No respiratory distress.     Breath sounds: Normal breath sounds. No decreased breath sounds, wheezing, rhonchi or rales.  Abdominal:     Palpations: Abdomen is soft.     Tenderness: There is no abdominal tenderness.  Musculoskeletal: Normal range of motion.     Right lower leg: He exhibits no tenderness.     Left lower leg: He exhibits no tenderness.  Skin:    General: Skin is warm and dry.     Capillary Refill: Capillary refill takes less than 2 seconds.  Neurological:     General: No focal deficit present.     Mental Status: He is alert.  Psychiatric:        Mood and Affect: Mood normal.      ED  Treatments / Results  Labs (all labs ordered are listed, but only abnormal results are displayed) Labs Reviewed  COMPREHENSIVE METABOLIC PANEL - Abnormal; Notable for the following components:      Result Value   Glucose, Bld 116 (*)    Total Protein 6.4 (*)  All other components within normal limits  CBC  LIPASE, BLOOD  I-STAT TROPONIN, ED    EKG EKG Interpretation  Date/Time:  Sunday November 14 2018 11:56:38 EST Ventricular Rate:  89 PR Interval:    QRS Duration: 106 QT Interval:  433 QTC Calculation: 471 R Axis:   43 Text Interpretation:  Sinus rhythm Paired ventricular premature complexes Borderline low voltage, extremity leads Probable left ventricular hypertrophy Confirmed by Virgina Norfolk [phone]) on 11/14/2018 12:14:05 PM   Radiology Dg Chest Portable 1 View  Result Date: 11/14/2018 CLINICAL DATA:  Chest pain extending into the left arm EXAM: PORTABLE CHEST 1 VIEW COMPARISON:  05/26/2017 FINDINGS: The heart size and mediastinal contours are within normal limits. Both lungs are clear. The visualized skeletal structures are unremarkable. IMPRESSION: No active disease. Electronically Signed   By: Elige Ko   On: 11/14/2018 12:38    Procedures Procedures (including critical care time)  Medications Ordered in ED Medications  aspirin chewable tablet 324 mg (324 mg Oral Not Given 11/14/18 1201)     Initial Impression / Assessment and Plan / ED Course  I have reviewed the triage vital signs and the nursing notes.  Pertinent labs & imaging results that were available during my care of the patient were reviewed by me and considered in my medical decision making (see chart for details).   [name] [name]. is a 73 year old male with history of CAD status post 2 stents who presents to the ED with chest pain.  Patient with unremarkable vitals.  No fever.  Patient with substernal chest pain with radiation and diaphoresis that resolved with aspirin and nitroglycerin by EMS.   EKG shows sinus rhythm.  Patient with some PVCs but no new ischemic changes.  Patient denies any shortness of breath, no hypoxia.  Wells criteria 0 and doubt PE.  Patient has high heart score.  Troponin within normal limits.  No significant anemia, electrolyte abnormality, kidney injury.  Chest x-ray showed no obvious signs of pneumonia, pneumothorax, pleural effusion.  Given his cardiac risk factors and no recent cardiac work-up will admit to medicine for further care and ACS rule out.  Patient remained chest pain-free throughout my care.  Hemodynamically stable throughout my care.  This chart was dictated using voice recognition software.  Despite best efforts to proofread,  errors can occur which can change the documentation meaning.    Final Clinical Impressions(s) / ED Diagnoses   Final diagnoses:  Chest pain, unspecified type    ED Discharge Orders    None       Virgina Norfolk, DO 11/14/18 1320

## 2018-11-14 NOTE — Significant Event (Signed)
CRITICAL VALUE ALERT  Critical Value:  Troponin 0.11  Date & Time Notied:  1712  Provider Notified: Attending MD (Family medicine) Dr. Homero Fellers  Orders Received/Actions taken:  MD notified and aware.     Dr. Homero Fellers also aware of pt's BG at 150 ( Order to call MD to start SSI).  No order received at this time.     Sim Boast, RN

## 2018-11-14 NOTE — Progress Notes (Signed)
ANTICOAGULATION CONSULT NOTE - Initial Consult  Pharmacy Consult for Heparin Indication: ACS/ STEMI  Allergies  Allergen Reactions  . Lipitor [Atorvastatin] Other (See Comments)    "Makes pt feel funky"    Patient Measurements: Height: 5\' 6"  (167.6 cm) Weight: 160 lb (72.6 kg) IBW/kg (Calculated) : 63.8 Heparin Dosing Weight: 72.6 kg  Vital Signs: Temp: 97.9 F (36.6 C) (03/01 1522) Temp Source: Oral (03/01 1522) BP: 154/92 (03/01 1522) Pulse Rate: 82 (03/01 1522)  Labs: Recent Labs    11/14/18 1203 11/14/18 1548  HGB 15.3  --   HCT 46.3  --   PLT 164  --   CREATININE 0.96  --   TROPONINI  --  0.11*    Estimated Creatinine Clearance: 62.8 mL/min (by C-G formula based on SCr of 0.96 mg/dL).   Medical History: Past Medical History:  Diagnosis Date  . Erectile dysfunction   . History of hiatal hernia    a lone time ago  . Hyperlipidemia   . Myocardial infarction (HCC)    2x  . Polio    Assessment: 73 yo male with CP to receive heparin drip per pharmacy for ACS/STEMI. Patient not on anticoagulation PTA.   Goal of Therapy:  Heparin level 0.3-0.7 units/ml Monitor platelets by anticoagulation protocol: Yes   Plan:  Heparin bolus 4000 units IV x 1 Heparin drip at 850 units/hr Heparin level in 8 hours Daily heparin level and CBC.  Monitor for bleeding.   Farah Benish A. Jeanella Craze, PharmD, BCPS Clinical Pharmacist [place] Please utilize Amion for appropriate phone number to reach the unit pharmacist [facility])    11/14/2018,5:37 PM

## 2018-11-14 NOTE — Progress Notes (Signed)
Patient called for another nitroglycerine. Stated pain beginning to come back but could not give me an actual pain score.

## 2018-11-14 NOTE — Significant Event (Signed)
Rapid Response Event Note  Overview: Chest Pain   Initial Focused Assessment: Called by [facility] RNs about patient having substernal chest pain. Patient is admitted for ACS/NSTEMI. RN had administered NTG 0.4 mg SL x 1 prior to calling me. Upon arrival, patient was alert and oriented, skin warm and dry. Patient stated that his chest pain was 5/10 when it started - mid chest - substernal, the pain does not radiate. + pulses and VS are stable. EKG was done as well. Heart and lung sounds normal.  Interventions: -- I paged the FMTS MD and updated MD on the patient's condition. CP had improved to 1/10 at 2255. Patient will be transferred to inpatient cardiac telemetry unit and I paged CARDS MD on call as well. Cards MD called back at 2258, a repeat EKG was done (first one did not transmit), MD reviewed EKG and no further interventions were ordered. 2310, patient was completely chest pain free.  Plan of Care: -- Will transition to Inpatient Cardiac Tele Unit  Event Summary:    at    Call Time 2236 Arrival Time 2239 End Time 2320  [name] [name] R

## 2018-11-14 NOTE — Consult Note (Signed)
Referring Physician:  Primary Physician: Mechele Claude, MD Reason for Consultation: NSTEMI   HPI: [name] [name]. is a 73 y.o. male presenting with retrosternal chest pain.  His past medical history significant for CAD (requiring stents x3, last in 2012), HLD, erectile dysfunction.  He reports that his symptoms began yesterday when he noticed that he was short of breath while stepping up a ladder.  It is very unusual for him to experience shortness of breath with such minor exertions.  The rest of his day was uneventful.  He woke early in the morning (around 2:30 AM) with significant lightheadedness and stayed awake for some time.  Later in the morning he started to experience nausea without vomiting.  After taking a shower he noticed that his lightheadedness became worse and then progressed to left arm pain.  Once he began to experience left arm pain he took a baby aspirin.  His arm pain worsened and he started to notice chest tightness (7/10) at which time he took 2 more baby aspirin and called 911.   He was told to crush up for baby aspirin which he did while waiting for EMS to arrive.  While en route to the hospital with EMS, he received 2 nitroglycerin at which time his chest tightness finally abated. States that the second ASA resolved his chest pain.  On review of stems, he denied back pain, jaw pain, vomiting, headache, new numbness, palpitations.  He generally sleeps with one pillow under his head and has not noticed any recent symptoms of chest pain with walking or with exertion.  In the ED, full-strength aspirin was administered, chest x-ray showed no acute cardiopulmonary findings, ECG showed frequent PVCs without ST elevation.   Review of Systems:     Cardiac Review of Systems: {Y] = yes [ ]  = no  Chest Pain [  y  ]  Resting SOB [   ] Exertional SOB  y]  Orthopnea [  ]   Pedal Edema [   ]    Palpitations [  ] Syncope  [  ]   Presyncope [   ]  General Review of Systems:  [Y] = yes [  ]=no Constitional: recent weight change [  ]; anorexia [  ]; fatigue [  ]; nausea [  ]; night sweats [  ]; fever [  ]; or chills [  ];                                                                     Eyes : blurred vision [  ]; diplopia [   ]; vision changes [  ];  Amaurosis fugax[  ]; Resp: cough [  ];  wheezing[  ];  hemoptysis[  ];  PND [  ];  GI:  gallstones[  ], vomiting[  ];  dysphagia[  ]; melena[  ];  hematochezia [  ]; heartburn[  ];   GU: kidney stones [  ]; hematuria[  ];   dysuria [  ];  nocturia[  ]; incontinence [  ];             Skin: rash, swelling[  ];, hair loss[  ];  peripheral edema[  ];  or itching[  ];  Musculosketetal: myalgias[  ];  joint swelling[  ];  joint erythema[  ];  joint pain[  ];  back pain[  ];  Heme/Lymph: bruising[  ];  bleeding[  ];  anemia[  ];  Neuro: TIA[  ];  headaches[  ];  stroke[  ];  vertigo[  ];  seizures[  ];   paresthesias[  ];  difficulty walking[  ];  Psych:depression[  ]; Kendell Bane  ];  Endocrine: diabetes[  ];  thyroid dysfunction[  ];  Other:  Past Medical History:  Diagnosis Date  . Erectile dysfunction   . History of hiatal hernia    a lone time ago  . Hyperlipidemia   . Myocardial infarction (HCC)    2x  . Polio     Medications Prior to Admission  Medication Sig Dispense Refill  . aspirin 81 MG chewable tablet Chew 81 mg by mouth daily.    . budesonide-formoterol (SYMBICORT) 80-4.5 MCG/ACT inhaler Inhale 2 puffs into the lungs 2 (two) times daily. (Patient not taking: Reported on 11/14/2018) 1 Inhaler 0  . hydrocortisone cream 0.5 % Apply 1 application topically 2 (two) times daily. x7-10 days (Patient not taking: Reported on 11/14/2018) 30 g 0  . methocarbamol (ROBAXIN) 500 MG tablet Take 1 tablet (500 mg total) by mouth every 6 (six) hours as needed for muscle spasms. (Patient not taking: Reported on 11/14/2018) 40 tablet 3  . rosuvastatin (CRESTOR) 10 MG tablet Take 1 tablet (10 mg total) by mouth daily. (Patient not  taking: Reported on 11/14/2018) 90 tablet 3  . sildenafil (REVATIO) 20 MG tablet TAKE 2 -5 TABLETS BY MOUTH DAILY AS NEEDED (Patient not taking: No sig reported) 50 tablet 2     . aspirin  324 mg Oral Once  . [START ON 11/15/2018] aspirin EC  81 mg Oral Daily  . heparin  4,000 Units Intravenous Once  . simvastatin  10 mg Oral q1800    Infusions: . heparin      Allergies  Allergen Reactions  . Lipitor [Atorvastatin] Other (See Comments)    "Makes pt feel funky"    Social History   Socioeconomic History  . Marital status: Divorced    Spouse name: Not on file  . Number of children: Not on file  . Years of education: Not on file  . Highest education level: Not on file  Occupational History  . Not on file  Social Needs  . Financial resource strain: Not on file  . Food insecurity:    Worry: Not on file    Inability: Not on file  . Transportation needs:    Medical: Not on file    Non-medical: Not on file  Tobacco Use  . Smoking status: Former Games developer  . Smokeless tobacco: Never Used  Substance and Sexual Activity  . Alcohol use: Yes    Alcohol/week: 21.0 standard drinks    Types: 21 Cans of beer per week  . Drug use: No  . Sexual activity: Not on file  Lifestyle  . Physical activity:    Days per week: Not on file    Minutes per session: Not on file  . Stress: Not on file  Relationships  . Social connections:    Talks on phone: Not on file    Gets together: Not on file    Attends religious service: Not on file    Active member of club or organization: Not on file    Attends meetings of clubs or organizations: Not on file  Relationship status: Not on file  . Intimate partner violence:    Fear of current or ex partner: Not on file    Emotionally abused: Not on file    Physically abused: Not on file    Forced sexual activity: Not on file  Other Topics Concern  . Not on file  Social History Narrative  . Not on file    Family History  Problem Relation Age of  Onset  . Diabetes Mother   . Heart disease Mother   . Heart disease Father     PHYSICAL EXAM: Vitals:   11/14/18 1400 11/14/18 1522  BP: (!) 158/88 (!) 154/92  Pulse: 64 82  Resp: 17 17  Temp:  97.9 F (36.6 C)  SpO2: 96% 98%     Intake/Output Summary (Last 24 hours) at 11/14/2018 1749 Last data filed at 11/14/2018 1600 Gross per 24 hour  Intake 360 ml  Output -  Net 360 ml    General:  Well appearing. No respiratory difficulty HEENT: normal Neck: supple. no JVD. Carotids 2+ bilat; no bruits. No lymphadenopathy or thryomegaly appreciated. Cor: PMI nondisplaced. Regular rate & rhythm. No rubs, gallops or murmurs. Lungs: clear Abdomen: soft, nontender, nondistended. No hepatosplenomegaly. No bruits or masses. Good bowel sounds. Extremities: no cyanosis, clubbing, rash, edema Neuro: alert & oriented x 3, cranial nerves grossly intact. moves all 4 extremities w/o difficulty. Affect pleasant.  ECG: Sinus with frequent PVC  Results for orders placed or performed during the hospital encounter of 11/14/18 (from the past 24 hour(s))  CBC     Status: None   Collection Time: 11/14/18 12:03 PM  Result Value Ref Range   WBC 8.5 4.0 - 10.5 K/uL   RBC 5.12 4.22 - 5.81 MIL/uL   Hemoglobin 15.3 13.0 - 17.0 g/dL   HCT 11.9 14.7 - 82.9 %   MCV 90.4 80.0 - 100.0 fL   MCH 29.9 26.0 - 34.0 pg   MCHC 33.0 30.0 - 36.0 g/dL   RDW 56.2 13.0 - 86.5 %   Platelets 164 150 - 400 K/uL   nRBC 0.0 0.0 - 0.2 %  Comprehensive metabolic panel     Status: Abnormal   Collection Time: 11/14/18 12:03 PM  Result Value Ref Range   Sodium 138 135 - 145 mmol/L   Potassium 4.3 3.5 - 5.1 mmol/L   Chloride 106 98 - 111 mmol/L   CO2 25 22 - 32 mmol/L   Glucose, Bld 116 (H) 70 - 99 mg/dL   BUN 18 8 - 23 mg/dL   Creatinine, Ser 7.84 0.61 - 1.24 mg/dL   Calcium 8.9 8.9 - 69.6 mg/dL   Total Protein 6.4 (L) 6.5 - 8.1 g/dL   Albumin 3.5 3.5 - 5.0 g/dL   AST 22 15 - 41 U/L   ALT 25 0 - 44 U/L   Alkaline  Phosphatase 60 38 - 126 U/L   Total Bilirubin 0.9 0.3 - 1.2 mg/dL   GFR calc non Af Amer >60 >60 mL/min   GFR calc Af Amer >60 >60 mL/min   Anion gap 7 5 - 15  Lipase, blood     Status: None   Collection Time: 11/14/18 12:03 PM  Result Value Ref Range   Lipase 38 11 - 51 U/L  I-stat troponin, ED (0, 3)     Status: None   Collection Time: 11/14/18 12:10 PM  Result Value Ref Range   Troponin i, poc 0.02 0.00 - 0.08 ng/mL   Comment  3          Troponin I - Now Then Q6H     Status: Abnormal   Collection Time: 11/14/18  3:48 PM  Result Value Ref Range   Troponin I 0.11 (HH) <0.03 ng/mL  Glucose, capillary     Status: Abnormal   Collection Time: 11/14/18  4:28 PM  Result Value Ref Range   Glucose-Capillary 150 (H) 70 - 99 mg/dL   Dg Chest Portable 1 View  Result Date: 11/14/2018 CLINICAL DATA:  Chest pain extending into the left arm EXAM: PORTABLE CHEST 1 VIEW COMPARISON:  05/26/2017 FINDINGS: The heart size and mediastinal contours are within normal limits. Both lungs are clear. The visualized skeletal structures are unremarkable. IMPRESSION: No active disease. Electronically Signed   By: Elige Ko   On: 11/14/2018 12:38     ASSESSMENT and Plan NSTEMI: Patient has NSTEMI and will start on heparin gtt and ASA. P2Y12 inhibitor after anatomy is known. Will start on ACEI and BB along with statins. Plan for cardiac cath at am. Will order echo.

## 2018-11-14 NOTE — H&P (Addendum)
Family Medicine Teaching [facility] Admission History and Physical Service Pager: [phone]  Patient name: [name] [name]. Medical record number: [id number] Date of birth: [birth year] Age: 73 y.o. Gender: male  Primary Care Provider: Mechele Claude, MD Consultants: Cardiology Code Status: Full code  Chief Complaint: "I think I am having another heart attack"  Assessment and Plan: [name]. is a 73 y.o. male presenting with chest pain. PMH is significant for CAD with 3 prior cardiac catheterizations (most recently 2009)  Chest pain 2/2 NSTEMI w/ h/o CAD [name] is a 73 y/o M with a history of CAD with 3 previous stents (last in 2009) presents with typical chest pain.  He reports of substernal chest pressure with radiation to the left arm consistent with previous episodes of MI.  He reports alleviation of this pain with sublingual nitro. On presentation, his vitals remarkable for hypertension up to 144/75, other vitals were within normal limits he was breathing comfortably on room air.  Physical exam was notable for irregular rhythm with dropped beats. EKG showed normal sinus rhythm with multiple PVCs. Admission labs were notable for creatinine 0.96, troponins trending from 0.02-0.11.  Chest x-ray was unremarkable.  He was initially admitted for ACS rule out and quickly found to have a rising troponin.  Cardiology was consulted and recommended heparin drip, beta-blocker, ACEI/ARB, statin and cardiac catheterization.  We have admitted him to the floor and will continue to monitor. -Admit to telemetry, attending Dr. Lum Babe -Consult cardiology, appreciate recommendations -N.p.o. for cardiac catheterization -Cardiac monitoring -Trend troponins -A.m. EKG -F/U echo -Morning BMP, CBC, TSH, lipid panel, A1c -Heparin GTT -Carvedilol -Losartan -Simvastatin -vitals per protocol  Hyperlipidemia He reports previously taking simvastatin 10 mg at home.  He had a significant  reaction to atorvastatin. Has not taken in the last 3 months as he had not received a refill or seen his PCP. -Continue simvastatin 10 mg -Consider increasing to high intensity statin  FEN/GI: N.p.o. in preparation for his cardiac catheterization Prophylaxis: Heparin drip, full dose aspirin  Disposition: 1 to 2-day stay anticipated pending the results of his cardiac catheterization.  History of Present Illness:  [name] [name]. is a 73 y.o. male presenting with typical chest pain.  His past medical history significant for CAD (requiring stents x3, last in 2009 per chart), HLD, erectile dysfunction.  He reports that his symptoms began yesterday when he noticed that he was short of breath while stepping up a ladder.  It is very unusual for him to experience shortness of breath with such minor exertions.  The rest of his day was uneventful.  He woke early in the morning (around 2:30 AM) with significant lightheadedness and stayed awake for some time.  Later in the morning he started to experience nausea without vomiting.  After taking a shower he noticed that his lightheadedness became worse and then progressed to left arm pain.  Once he began to experience left arm pain he took a baby aspirin.  His arm pain worsened and he started to notice chest tightness (7/10) at which time he took 2 more baby aspirin and called 911.   He was told by EMS to crush up 4 baby aspirin which he did while waiting for EMS to arrive.  While en route to the hospital with EMS, he received 2 nitroglycerin at which time his chest tightness finally abated. He does have nitroglycerin at home but is an old bottle and has not received new Rx in years.  On review of stems, he denied back pain, jaw pain, vomiting, headache, new numbness, palpitations, ongoing chest pain.  He generally sleeps with one pillow under his head and has not noticed any recent symptoms of chest pain with walking or with exertion.  In the ED,  full-strength aspirin was administered, chest x-ray showed no acute cardiopulmonary findings, ECG showed frequent PVCs without ST elevation.  Review Of Systems: Per HPI with the following additions:  Review of Systems  Constitutional: Negative for chills, diaphoresis, fever and weight loss.  HENT: Positive for congestion. Negative for sore throat.   Respiratory: Positive for shortness of breath. Negative for cough.   Cardiovascular: Positive for chest pain. Negative for palpitations, orthopnea, claudication and PND.  Gastrointestinal: Positive for nausea. Negative for abdominal pain, constipation, diarrhea and vomiting.  Genitourinary: Negative for dysuria.  Musculoskeletal: Positive for falls (one week ago, no head trauma, worked the next day) and neck pain (chronic from surgery). Negative for myalgias.  Neurological: Positive for speech change (none acute, mild numbness in L arm). Negative for tingling and tremors.  Endo/Heme/Allergies: Does not bruise/bleed easily.  Psychiatric/Behavioral: The patient does not have insomnia.     Patient Active Problem List   Diagnosis Date Noted  . Chest pain 11/14/2018  . Spondylolisthesis at L4-L5 level 06/08/2017  . Coronary artery disease involving native coronary artery of native heart without angina pectoris 05/08/2017  . Mixed hyperlipidemia 05/08/2017  . Erectile dysfunction 03/29/2013  . HYPERCHOLESTEROLEMIA  IIA 12/11/2008  . Coronary atherosclerosis 12/11/2008    Past Medical History: Past Medical History:  Diagnosis Date  . Erectile dysfunction   . History of hiatal hernia    a lone time ago  . Hyperlipidemia   . Myocardial infarction (HCC)    2x  . Polio     Past Surgical History: Past Surgical History:  Procedure Laterality Date  . APPENDECTOMY    . CARDIAC CATHETERIZATION     has two stents placed  . CERVICAL SPINE SURGERY     c3-4, 4-5,  5-6, 8 screws and plates  . CORONARY STENT PLACEMENT    . forein body removal      metal sharp removed from right leg    Social History: Social History   Tobacco Use  . Smoking status: Former Games developer  . Smokeless tobacco: Never Used  Substance Use Topics  . Alcohol use: Yes    Alcohol/week: 21.0 standard drinks    Types: 21 Cans of beer per week  . Drug use: No   Family History: Family History  Problem Relation Age of Onset  . Diabetes Mother   . Heart disease Mother   . Heart disease Father    Allergies and Medications: Allergies  Allergen Reactions  . Lipitor [Atorvastatin] Other (See Comments)    "Makes pt feel funky"   No current facility-administered medications on file prior to encounter.    Current Outpatient Medications on File Prior to Encounter  Medication Sig Dispense Refill  . aspirin 81 MG chewable tablet Chew 81 mg by mouth daily.    . budesonide-formoterol (SYMBICORT) 80-4.5 MCG/ACT inhaler Inhale 2 puffs into the lungs 2 (two) times daily. (Patient not taking: Reported on 11/14/2018) 1 Inhaler 0  . hydrocortisone cream 0.5 % Apply 1 application topically 2 (two) times daily. x7-10 days (Patient not taking: Reported on 11/14/2018) 30 g 0  . methocarbamol (ROBAXIN) 500 MG tablet Take 1 tablet (500 mg total) by mouth every 6 (six) hours as needed for muscle spasms. (  Patient not taking: Reported on 11/14/2018) 40 tablet 3  . rosuvastatin (CRESTOR) 10 MG tablet Take 1 tablet (10 mg total) by mouth daily. (Patient not taking: Reported on 11/14/2018) 90 tablet 3  . sildenafil (REVATIO) 20 MG tablet TAKE 2 -5 TABLETS BY MOUTH DAILY AS NEEDED (Patient not taking: No sig reported) 50 tablet 2    Objective: BP (!) 158/88   Pulse 64   Temp 97.6 F (36.4 C) (Oral)   Resp 17   Ht  (1.676 m)   Wt 72.6 kg   SpO2 96%   BMI 25.82 kg/m  Exam: Physical Exam Constitutional:      Appearance: He is well-developed and normal weight.  Eyes:     Extraocular Movements: Extraocular movements intact.     Pupils: Pupils are equal, round, and reactive to  light.  Neck:     Thyroid: No thyromegaly.  Cardiovascular:     Rate and Rhythm: Normal rate and regular rhythm.     Pulses:          Radial pulses are 2+ on the right side and 2+ on the left side.     Heart sounds: Normal heart sounds. No murmur. No S3 or S4 sounds.      Comments: Frequent dropped beats Pulmonary:     Effort: Pulmonary effort is normal. No tachypnea or respiratory distress.     Breath sounds: Normal breath sounds.  Chest:     Chest wall: No tenderness.  Abdominal:     General: Bowel sounds are normal.     Palpations: Abdomen is soft.  Musculoskeletal:     Right lower leg: No edema.     Left lower leg: No edema.  Skin:    General: Skin is warm and dry.     Capillary Refill: Capillary refill takes 2 to 3 seconds.  Neurological:     General: No focal deficit present.     Mental Status: He is alert.  Psychiatric:        Mood and Affect: Mood normal.        Behavior: Behavior normal.    Labs and Imaging: CBC BMET  Recent Labs  Lab 11/14/18 1203  WBC 8.5  HGB 15.3  HCT 46.3  PLT 164   Recent Labs  Lab 11/14/18 1203  NA 138  K 4.3  CL 106  CO2 25  BUN 18  CREATININE 0.96  GLUCOSE 116*  CALCIUM 8.9     Dg Chest Portable 1 View  Result Date: 11/14/2018 CLINICAL DATA:  Chest pain extending into the left arm EXAM: PORTABLE CHEST 1 VIEW COMPARISON:  05/26/2017 FINDINGS: The heart size and mediastinal contours are within normal limits. Both lungs are clear. The visualized skeletal structures are unremarkable. IMPRESSION: No active disease. Electronically Signed   By: Elige Ko   On: 11/14/2018 12:38    Mirian Mo, MD 11/14/2018, 2:31 PM PGY-1, [facility] Family Medicine FPTS Intern pager: [phone], text pages welcome  FPTS Upper-Level Resident Addendum   I have independently interviewed and examined the patient. I have discussed the above with the original author and agree with their documentation. My edits for correction/addition/clarification  are in green. Please see also any attending notes.    Ellwood Dense, DO PGY-2, [place] 11/14/2018 9:18 PM  FPTS Service pager: [phone] (text pages welcome through AMION)

## 2018-11-14 NOTE — Progress Notes (Signed)
Spoke with rapid response nurse, Puja, regarding complaint of chest pain in patient.  She stated patient was feeling better s/p nitroglycerin.  Currently 1-2/10 pain level.  Troponin went from 0.11 to 1.27.  Rapid response nurse stated she would notify cardiology. Patient currently on heparin drip.

## 2018-11-14 NOTE — Progress Notes (Signed)
CRITICAL VALUE ALERT  Critical Value:  Troponin 1.27 Date & Time Notied:  03/01 @2158   Provider Notified:Family Medicine TS Orders Received/Actions taken: pending, protocol followed EKG and Nitro 0.4mg  given. Rapid response also called.

## 2018-11-14 NOTE — ED Notes (Signed)
ED TO INPATIENT HANDOFF REPORT  ED Nurse Name and Phone #: Symantha Steeber [phone]  S Name/Age/Gender [name] [name]. 73 y.o. male Room/Bed: 018C/018C  Code Status   Code Status: Prior  Home/SNF/Other Home Patient oriented to: self, place, time and situation Is this baseline? Yes   Triage Complete: Triage complete  Chief Complaint cp  Triage Note Pt brought in by ems for c/o left sided chest pain radiating to the left arm that began  around 930 am today pt also states that he noticed he was getting SOB yesterday ; pt received 324 of asa and 2 nitro prior to arrival ; pt reports his cp decrease at this time    Allergies Allergies  Allergen Reactions  . Lipitor [Atorvastatin] Other (See Comments)    "Makes pt feel funky"    Level of Care/Admitting Diagnosis ED Disposition    ED Disposition Condition Comment   Admit  Hospital Area: [facility] [[id number]]  Level of Care: Cardiac Telemetry [103]  Diagnosis: Chest pain [092330]  Admitting Physician: Mirian Mo [[id number]]  Attending Physician: Janit Pagan T [[id number]]  PT Class (Do Not Modify): Observation [104]  PT Acc Code (Do Not Modify): Observation [10022]       B Medical/Surgery History Past Medical History:  Diagnosis Date  . Erectile dysfunction   . History of hiatal hernia    a lone time ago  . Hyperlipidemia   . Myocardial infarction (HCC)    2x  . Polio    Past Surgical History:  Procedure Laterality Date  . APPENDECTOMY    . CARDIAC CATHETERIZATION     has two stents placed  . CERVICAL SPINE SURGERY     c3-4, 4-5,  5-6, 8 screws and plates  . CORONARY STENT PLACEMENT    . forein body removal     metal sharp removed from right leg     A IV Location/Drains/Wounds Patient Lines/Drains/Airways Status   Active Line/Drains/Airways    Name:   Placement date:   Placement time:   Site:   Days:   Peripheral IV 11/14/18 Left Antecubital   11/14/18    -    Antecubital   less than 1    Incision (Closed) 06/08/17 Back Other (Comment)   06/08/17    1113     524          Intake/Output Last 24 hours No intake or output data in the 24 hours ending 11/14/18 1407  Labs/Imaging Results for orders placed or performed during the hospital encounter of 11/14/18 (from the past 48 hour(s))  CBC     Status: None   Collection Time: 11/14/18 12:03 PM  Result Value Ref Range   WBC 8.5 4.0 - 10.5 K/uL   RBC 5.12 4.22 - 5.81 MIL/uL   Hemoglobin 15.3 13.0 - 17.0 g/dL   HCT 33.5 45.6 - 25.6 %   MCV 90.4 80.0 - 100.0 fL   MCH 29.9 26.0 - 34.0 pg   MCHC 33.0 30.0 - 36.0 g/dL   RDW 38.9 37.3 - 42.8 %   Platelets 164 150 - 400 K/uL   nRBC 0.0 0.0 - 0.2 %    Comment: Performed at [facility] Lab, [address] [place], [place] [address]  Comprehensive metabolic panel     Status: Abnormal   Collection Time: 11/14/18 12:03 PM  Result Value Ref Range   Sodium 138 135 - 145 mmol/L   Potassium 4.3 3.5 - 5.1 mmol/L  Chloride 106 98 - 111 mmol/L   CO2 25 22 - 32 mmol/L   Glucose, Bld 116 (H) 70 - 99 mg/dL   BUN 18 8 - 23 mg/dL   Creatinine, Ser 1.63 0.61 - 1.24 mg/dL   Calcium 8.9 8.9 - 84.5 mg/dL   Total Protein 6.4 (L) 6.5 - 8.1 g/dL   Albumin 3.5 3.5 - 5.0 g/dL   AST 22 15 - 41 U/L   ALT 25 0 - 44 U/L   Alkaline Phosphatase 60 38 - 126 U/L   Total Bilirubin 0.9 0.3 - 1.2 mg/dL   GFR calc non Af Amer >60 >60 mL/min   GFR calc Af Amer >60 >60 mL/min   Anion gap 7 5 - 15    Comment: Performed at [facility], [address], [place], [place] [address]  Lipase, [name]     Status: None   Collection Time: 11/14/18 12:03 PM  Result Value Ref Range   Lipase 38 11 - 51 U/L    Comment: Performed at [facility], [address] [place], [place] [address]  I-stat troponin, ED (0, 3)     Status: None   Collection Time: 11/14/18 12:10 PM  Result Value Ref Range   Troponin i, poc 0.02 0.00 - 0.08 ng/mL   Comment 3            Comment: Due to the release kinetics of  cTnI, a negative result within the first hours of the onset of symptoms does not rule out myocardial infarction with certainty. If myocardial infarction is still suspected, repeat the test at appropriate intervals.    Dg Chest Portable 1 View  Result Date: 11/14/2018 CLINICAL DATA:  Chest pain extending into the left arm EXAM: PORTABLE CHEST 1 VIEW COMPARISON:  05/26/2017 FINDINGS: The heart size and mediastinal contours are within normal limits. Both lungs are clear. The visualized skeletal structures are unremarkable. IMPRESSION: No active disease. Electronically Signed   By: Elige Ko   On: 11/14/2018 12:38    Pending Labs Unresulted Labs (From admission, onward)   None      Vitals/Pain Today's Vitals   11/14/18 1159 11/14/18 1300 11/14/18 1305 11/14/18 1405  BP:  136/81    Pulse:  (!) 59    Resp:  16    Temp:      TempSrc:      SpO2:  100%    Weight: 72.6 kg     Height: 5\' 6"  (1.676 m)     PainSc: 2   1  0-No pain    Isolation Precautions No active isolations  Medications Medications  aspirin chewable tablet 324 mg (324 mg Oral Not Given 11/14/18 1201)    Mobility walks Low fall risk   Focused Assessments Cardiac Assessment Handoff:  Cardiac Rhythm: Normal sinus rhythm Lab Results  Component Value Date   CKTOTAL 106 08/10/2008   CKMB 5.6 (H) 08/10/2008   TROPONINI (HH) 08/10/2008    1.90        POSSIBLE MYOCARDIAL ISCHEMIA. SERIAL TESTING RECOMMENDED. CRITICAL VALUE NOTED.  VALUE IS CONSISTENT WITH PREVIOUSLY REPORTED AND CALLED VALUE.   Lab Results  Component Value Date   DDIMER  08/08/2008    0.39        AT THE INHOUSE ESTABLISHED CUTOFF VALUE OF 0.48 ug/mL FEU, THIS ASSAY HAS BEEN DOCUMENTED IN THE LITERATURE TO HAVE   Does the Patient currently have chest pain? No     R Recommendations: See  Admitting Provider Note  Report given to:   Additional Notes:

## 2018-11-14 NOTE — ED Triage Notes (Signed)
Pt brought in by ems for c/o left sided chest pain radiating to the left arm that began  around 930 am today pt also states that he noticed he was getting SOB yesterday ; pt received 324 of asa and 2 nitro prior to arrival ; pt reports his cp decrease at this time

## 2018-11-15 ENCOUNTER — Observation Stay (HOSPITAL_BASED_OUTPATIENT_CLINIC_OR_DEPARTMENT_OTHER): Payer: Medicare Other

## 2018-11-15 ENCOUNTER — Encounter (HOSPITAL_COMMUNITY): Admission: EM | Disposition: A | Discharge status: Home / Self Care | Payer: Self-pay | Source: Home / Self Care

## 2018-11-15 ENCOUNTER — Encounter (HOSPITAL_COMMUNITY): Payer: Self-pay | Admitting: Internal Medicine

## 2018-11-15 DIAGNOSIS — Z833 Family history of diabetes mellitus: Secondary | ICD-10-CM | POA: Diagnosis not present

## 2018-11-15 DIAGNOSIS — R079 Chest pain, unspecified: Secondary | ICD-10-CM | POA: Diagnosis not present

## 2018-11-15 DIAGNOSIS — N529 Male erectile dysfunction, unspecified: Secondary | ICD-10-CM | POA: Diagnosis present

## 2018-11-15 DIAGNOSIS — I2510 Atherosclerotic heart disease of native coronary artery without angina pectoris: Secondary | ICD-10-CM | POA: Diagnosis present

## 2018-11-15 DIAGNOSIS — I214 Non-ST elevation (NSTEMI) myocardial infarction: Secondary | ICD-10-CM | POA: Diagnosis present

## 2018-11-15 DIAGNOSIS — I493 Ventricular premature depolarization: Secondary | ICD-10-CM | POA: Diagnosis present

## 2018-11-15 DIAGNOSIS — Z87891 Personal history of nicotine dependence: Secondary | ICD-10-CM | POA: Diagnosis not present

## 2018-11-15 DIAGNOSIS — I340 Nonrheumatic mitral (valve) insufficiency: Secondary | ICD-10-CM | POA: Diagnosis not present

## 2018-11-15 DIAGNOSIS — Z888 Allergy status to other drugs, medicaments and biological substances status: Secondary | ICD-10-CM | POA: Diagnosis not present

## 2018-11-15 DIAGNOSIS — Z955 Presence of coronary angioplasty implant and graft: Secondary | ICD-10-CM | POA: Diagnosis not present

## 2018-11-15 DIAGNOSIS — I252 Old myocardial infarction: Secondary | ICD-10-CM

## 2018-11-15 DIAGNOSIS — I10 Essential (primary) hypertension: Secondary | ICD-10-CM | POA: Diagnosis present

## 2018-11-15 DIAGNOSIS — I200 Unstable angina: Secondary | ICD-10-CM | POA: Diagnosis not present

## 2018-11-15 DIAGNOSIS — E782 Mixed hyperlipidemia: Secondary | ICD-10-CM

## 2018-11-15 DIAGNOSIS — T82855A Stenosis of coronary artery stent, initial encounter: Secondary | ICD-10-CM | POA: Diagnosis present

## 2018-11-15 DIAGNOSIS — E785 Hyperlipidemia, unspecified: Secondary | ICD-10-CM | POA: Diagnosis not present

## 2018-11-15 DIAGNOSIS — I25119 Atherosclerotic heart disease of native coronary artery with unspecified angina pectoris: Secondary | ICD-10-CM | POA: Diagnosis not present

## 2018-11-15 DIAGNOSIS — T50996A Underdosing of other drugs, medicaments and biological substances, initial encounter: Secondary | ICD-10-CM | POA: Diagnosis present

## 2018-11-15 DIAGNOSIS — Z8249 Family history of ischemic heart disease and other diseases of the circulatory system: Secondary | ICD-10-CM | POA: Diagnosis not present

## 2018-11-15 DIAGNOSIS — I2584 Coronary atherosclerosis due to calcified coronary lesion: Secondary | ICD-10-CM | POA: Diagnosis present

## 2018-11-15 DIAGNOSIS — Z7982 Long term (current) use of aspirin: Secondary | ICD-10-CM | POA: Diagnosis not present

## 2018-11-15 DIAGNOSIS — Z8612 Personal history of poliomyelitis: Secondary | ICD-10-CM | POA: Diagnosis not present

## 2018-11-15 HISTORY — PX: LEFT HEART CATH AND CORONARY ANGIOGRAPHY: CATH118249

## 2018-11-15 LAB — POCT ACTIVATED CLOTTING TIME
Activated Clotting Time: 213 seconds
Activated Clotting Time: 241 seconds
Activated Clotting Time: 241 seconds

## 2018-11-15 LAB — BASIC METABOLIC PANEL
Anion gap: 7 (ref 5–15)
BUN: 17 mg/dL (ref 8–23)
CO2: 26 mmol/L (ref 22–32)
Calcium: 9.3 mg/dL (ref 8.9–10.3)
Chloride: 107 mmol/L (ref 98–111)
Creatinine, Ser: 0.97 mg/dL (ref 0.61–1.24)
GFR calc Af Amer: >60 mL/min (ref >60)
GFR calc non Af Amer: >60 mL/min (ref >60)
Glucose, Bld: 119 mg/dL — ABNORMAL HIGH (ref 70–99)
Potassium: 4.3 mmol/L (ref 3.5–5.1)
Sodium: 140 mmol/L (ref 135–145)

## 2018-11-15 LAB — CBC
HCT: 45.1 % (ref 39.0–52.0)
Hemoglobin: 15.5 g/dL (ref 13.0–17.0)
MCH: 30.7 pg (ref 26.0–34.0)
MCHC: 34.4 g/dL (ref 30.0–36.0)
MCV: 89.3 fL (ref 80.0–100.0)
Platelets: 176 10*3/uL (ref 150–400)
RBC: 5.05 MIL/uL (ref 4.22–5.81)
RDW: 13.3 % (ref 11.5–15.5)
WBC: 10.1 10*3/uL (ref 4.0–10.5)
nRBC: 0 % (ref 0.0–0.2)

## 2018-11-15 LAB — ECHOCARDIOGRAM COMPLETE
Height: 67 in
WEIGHTICAEL: 2531.2 [oz_av]

## 2018-11-15 LAB — LIPID PANEL
CHOL/HDL RATIO: 4.4 ratio
Cholesterol: 190 mg/dL (ref 0–200)
HDL: 43 mg/dL (ref >40)
LDL Cholesterol: 132 mg/dL — ABNORMAL HIGH (ref 0–99)
Triglycerides: 74 mg/dL (ref <150)
VLDL: 15 mg/dL (ref 0–40)

## 2018-11-15 LAB — HEPARIN LEVEL (UNFRACTIONATED): Heparin Unfractionated: 0.18 IU/mL — ABNORMAL LOW (ref 0.30–0.70)

## 2018-11-15 LAB — HEMOGLOBIN A1C
Hgb A1c MFr Bld: 5.3 % (ref 4.8–5.6)
MEAN PLASMA GLUCOSE: 105.41 mg/dL

## 2018-11-15 LAB — MAGNESIUM: Magnesium: 2.2 mg/dL (ref 1.7–2.4)

## 2018-11-15 LAB — TROPONIN I: Troponin I: 4.41 ng/mL (ref <0.03)

## 2018-11-15 SURGERY — LEFT HEART CATH AND CORONARY ANGIOGRAPHY
Anesthesia: LOCAL

## 2018-11-15 MED ORDER — SODIUM CHLORIDE 0.9 % IV SOLN: 250.0000 mL | INTRAVENOUS | Status: DC | PRN

## 2018-11-15 MED ORDER — SODIUM CHLORIDE 0.9% FLUSH: 3.0000 mL | INTRAVENOUS | Status: DC | PRN

## 2018-11-15 MED ORDER — TICAGRELOR 90 MG PO TABS
90.0000 mg | ORAL_TABLET | Freq: Two times a day (BID) | ORAL | Status: DC
  Administered 2018-11-15 – 2018-11-16 (×2): 90 mg via ORAL
  Filled 2018-11-15 (×2): qty 1

## 2018-11-15 MED ORDER — NITROGLYCERIN 1 MG/10 ML FOR IR/CATH LAB
INTRA_ARTERIAL | Status: AC
  Filled 2018-11-15: qty 10

## 2018-11-15 MED ORDER — HEPARIN (PORCINE) IN NACL 1000-0.9 UT/500ML-% IV SOLN
INTRAVENOUS | Status: DC | PRN
  Administered 2018-11-15: 500 mL

## 2018-11-15 MED ORDER — SODIUM CHLORIDE 0.9% FLUSH
3.0000 mL | Freq: Two times a day (BID) | INTRAVENOUS | Status: DC
  Administered 2018-11-16: 3 mL via INTRAVENOUS

## 2018-11-15 MED ORDER — ASPIRIN 81 MG PO CHEW
81.0000 mg | CHEWABLE_TABLET | ORAL | Status: AC
  Administered 2018-11-15: 81 mg via ORAL
  Filled 2018-11-15: qty 1

## 2018-11-15 MED ORDER — SODIUM CHLORIDE 0.9 % WEIGHT BASED INFUSION
1.0000 mL/kg/h | INTRAVENOUS | Status: DC
  Administered 2018-11-15: 1 mL/kg/h via INTRAVENOUS

## 2018-11-15 MED ORDER — VERAPAMIL HCL 2.5 MG/ML IV SOLN
INTRAVENOUS | Status: DC | PRN
  Administered 2018-11-15: 11:00:00 via INTRA_ARTERIAL

## 2018-11-15 MED ORDER — SODIUM CHLORIDE 0.9 % IV SOLN: INTRAVENOUS | Status: AC

## 2018-11-15 MED ORDER — SODIUM CHLORIDE 0.9 % WEIGHT BASED INFUSION: 3.0000 mL/kg/h | INTRAVENOUS | Status: DC

## 2018-11-15 MED ORDER — TICAGRELOR 90 MG PO TABS
ORAL_TABLET | ORAL | Status: AC
  Filled 2018-11-15: qty 1

## 2018-11-15 MED ORDER — ROSUVASTATIN CALCIUM 5 MG PO TABS
10.0000 mg | ORAL_TABLET | Freq: Every day | ORAL | Status: DC
  Administered 2018-11-15: 18:00:00 10 mg via ORAL
  Filled 2018-11-15: qty 2

## 2018-11-15 MED ORDER — FENTANYL CITRATE (PF) 100 MCG/2ML IJ SOLN
INTRAMUSCULAR | Status: DC | PRN
  Administered 2018-11-15: 50 ug via INTRAVENOUS

## 2018-11-15 MED ORDER — FENTANYL CITRATE (PF) 100 MCG/2ML IJ SOLN
INTRAMUSCULAR | Status: AC
  Filled 2018-11-15: qty 2

## 2018-11-15 MED ORDER — MIDAZOLAM HCL 2 MG/2ML IJ SOLN
INTRAMUSCULAR | Status: AC
  Filled 2018-11-15: qty 2

## 2018-11-15 MED ORDER — IOHEXOL 350 MG/ML SOLN
INTRAVENOUS | Status: DC | PRN
  Administered 2018-11-15: 95 mL via INTRA_ARTERIAL

## 2018-11-15 MED ORDER — HEPARIN (PORCINE) IN NACL 1000-0.9 UT/500ML-% IV SOLN
INTRAVENOUS | Status: AC
  Filled 2018-11-15: qty 1000

## 2018-11-15 MED ORDER — LABETALOL HCL 5 MG/ML IV SOLN: 10.0000 mg | INTRAVENOUS | Status: AC | PRN

## 2018-11-15 MED ORDER — HEPARIN (PORCINE) 25000 UT/250ML-% IV SOLN
1150.0000 [IU]/h | INTRAVENOUS | Status: DC
  Administered 2018-11-15: 1000 [IU]/h via INTRAVENOUS
  Filled 2018-11-15: qty 250

## 2018-11-15 MED ORDER — TICAGRELOR 90 MG PO TABS
ORAL_TABLET | ORAL | Status: DC | PRN
  Administered 2018-11-15: 180 mg via ORAL

## 2018-11-15 MED ORDER — LIDOCAINE HCL (PF) 1 % IJ SOLN
INTRAMUSCULAR | Status: DC | PRN
  Administered 2018-11-15: 2 mL

## 2018-11-15 MED ORDER — MIDAZOLAM HCL 2 MG/2ML IJ SOLN
INTRAMUSCULAR | Status: DC | PRN
  Administered 2018-11-15: 1 mg via INTRAVENOUS

## 2018-11-15 MED ORDER — SODIUM CHLORIDE 0.9% FLUSH: 3.0000 mL | Freq: Two times a day (BID) | INTRAVENOUS | Status: DC

## 2018-11-15 MED ORDER — VERAPAMIL HCL 2.5 MG/ML IV SOLN
INTRAVENOUS | Status: AC
  Filled 2018-11-15: qty 2

## 2018-11-15 MED ORDER — HEPARIN SODIUM (PORCINE) 1000 UNIT/ML IJ SOLN
INTRAMUSCULAR | Status: AC
  Filled 2018-11-15: qty 1

## 2018-11-15 MED ORDER — HYDRALAZINE HCL 20 MG/ML IJ SOLN: 5.0000 mg | INTRAMUSCULAR | Status: AC | PRN

## 2018-11-15 MED ORDER — LIDOCAINE HCL (PF) 1 % IJ SOLN
INTRAMUSCULAR | Status: AC
  Filled 2018-11-15: qty 30

## 2018-11-15 MED ORDER — HEPARIN SODIUM (PORCINE) 1000 UNIT/ML IJ SOLN
INTRAMUSCULAR | Status: DC | PRN
  Administered 2018-11-15 (×2): 3500 [IU] via INTRAVENOUS
  Administered 2018-11-15: 2000 [IU] via INTRAVENOUS
  Administered 2018-11-15: 3000 [IU] via INTRAVENOUS

## 2018-11-15 MED ORDER — ASPIRIN EC 81 MG PO TBEC
81.0000 mg | DELAYED_RELEASE_TABLET | Freq: Every day | ORAL | Status: DC
  Administered 2018-11-16: 81 mg via ORAL
  Filled 2018-11-15: qty 1

## 2018-11-15 SURGICAL SUPPLY — 17 items
BALLN SAPPHIRE 2.0X12 (BALLOONS) ×2
BALLOON SAPPHIRE 2.0X12 (BALLOONS) ×1 IMPLANT
CATH 5FR JL3.5 JR4 ANG PIG MP (CATHETERS) ×2 IMPLANT
CATH LAUNCHER 6FR AL.75 (CATHETERS) ×2 IMPLANT
DEVICE RAD COMP TR BAND LRG (VASCULAR PRODUCTS) ×2 IMPLANT
ELECT DEFIB PAD ADLT CADENCE (PAD) ×2 IMPLANT
GLIDESHEATH SLEND SS 6F .021 (SHEATH) ×2 IMPLANT
GUIDEWIRE INQWIRE 1.5J.035X260 (WIRE) ×1 IMPLANT
INQWIRE 1.5J .035X260CM (WIRE) ×2
KIT ENCORE 26 ADVANTAGE (KITS) ×2 IMPLANT
KIT HEART LEFT (KITS) ×2 IMPLANT
PACK CARDIAC CATHETERIZATION (CUSTOM PROCEDURE TRAY) ×2 IMPLANT
SYR MEDRAD MARK 7 150ML (SYRINGE) ×2 IMPLANT
TRANSDUCER W/STOPCOCK (MISCELLANEOUS) ×2 IMPLANT
TUBING CIL FLEX 10 FLL-RA (TUBING) ×2 IMPLANT
WIRE ASAHI GRAND SLAM 180CM (WIRE) ×2 IMPLANT
WIRE ASAHI SION 190X3X12 .014 (WIRE) ×2 IMPLANT

## 2018-11-15 NOTE — Progress Notes (Addendum)
Progress Note  Patient Name: [name] [name]. Date of Encounter: 11/15/2018  Primary Cardiologist: Kristeen Miss, MD   Subjective   73 year old gentleman with a history of coronary artery disease in the past.  He also has hyperlipidemia.  He presented with retrosternal chest pain.  The patient started having symptoms yesterday while he was setting up a ladder.  He had experienced shortness of breath and some mild chest tightness.  He noted that it was very unusual for him to feel such symptoms with minor exertion. Woke up later in the morning around 2:30 AM with some lightheadedness.  He developed some chest pain and left arm pain.  He took several baby aspirin and called 911. Patient was brought to the emergency room.  He received 2 sublingual nitroglycerin at which time the chest tightness resolved.  Inpatient Medications    Scheduled Meds: . aspirin  324 mg Oral Once  . aspirin  81 mg Oral Pre-Cath  . aspirin EC  325 mg Oral Daily  . carvedilol  3.125 mg Oral BID WC  . losartan  25 mg Oral Daily  . simvastatin  10 mg Oral q1800   Continuous Infusions: . sodium chloride     Followed by  . sodium chloride    . heparin 1,000 Units/hr (11/15/18 0538)   PRN Meds: acetaminophen, ondansetron (ZOFRAN) IV   Vital Signs    Vitals:   11/14/18 2248 11/14/18 2250 11/15/18 0022 11/15/18 0444  BP: 110/73 (!) 119/91 108/65 131/80  Pulse: 65 81 65 65  Resp: 16 19 17 16   Temp: 97.8 F (36.6 C)  (!) 97.4 F (36.3 C) (!) 97.4 F (36.3 C)  TempSrc: Oral     SpO2: 99% 99% 95% 94%  Weight:   71.8 kg   Height:   5\' 7"  (1.702 m)     Intake/Output Summary (Last 24 hours) at 11/15/2018 0849 Last data filed at 11/14/2018 1600 Gross per 24 hour  Intake 360 ml  Output -  Net 360 ml   Last 3 Weights 11/15/2018 11/14/2018 12/30/2017  Weight (lbs) 158 lb 3.2 oz 160 lb 160 lb 2 oz  Weight (kg) 71.759 kg 72.576 kg 72.632 kg      Telemetry    NSR  - Personally Reviewed  ECG    NSR ,  TWI in inf. Leads.   Occasional PVCs  - Personally Reviewed  Physical Exam    GEN: No acute distress.   Neck: No JVD Cardiac: RRR, no murmurs, rubs, or gallops.  Respiratory: Clear to auscultation bilaterally. GI: Soft, nontender, non-distended  MS: No edema; No deformity. Neuro:  Nonfocal  Psych: Normal affect   Labs    Chemistry Recent Labs  Lab 11/14/18 1203  NA 138  K 4.3  CL 106  CO2 25  GLUCOSE 116*  BUN 18  CREATININE 0.96  CALCIUM 8.9  PROT 6.4*  ALBUMIN 3.5  AST 22  ALT 25  ALKPHOS 60  BILITOT 0.9  GFRNONAA >60  GFRAA >60  ANIONGAP 7     Hematology Recent Labs  Lab 11/14/18 1203 11/15/18 0355  WBC 8.5 10.1  RBC 5.12 5.05  HGB 15.3 15.5  HCT 46.3 45.1  MCV 90.4 89.3  MCH 29.9 30.7  MCHC 33.0 34.4  RDW 13.0 13.3  PLT 164 176    Cardiac Enzymes Recent Labs  Lab 11/14/18 1548 11/14/18 2107 11/15/18 0355  TROPONINI 0.11* 1.27* 4.41*    Recent Labs  Lab 11/14/18 1210  TROPIPOC 0.02     BNPNo results for input(s): BNP, PROBNP in the last 168 hours.   DDimer No results for input(s): DDIMER in the last 168 hours.   Radiology    Dg Chest Portable 1 View  Result Date: 11/14/2018 CLINICAL DATA:  Chest pain extending into the left arm EXAM: PORTABLE CHEST 1 VIEW COMPARISON:  05/26/2017 FINDINGS: The heart size and mediastinal contours are within normal limits. Both lungs are clear. The visualized skeletal structures are unremarkable. IMPRESSION: No active disease. Electronically Signed   By: Elige Ko   On: 11/14/2018 12:38    Cardiac Studies      Patient Profile     73 y.o. male with a history of coronary artery disease.  He presents with a non-ST segment elevation myocardial infarction.  Assessment & Plan     Coronary artery disease: The patient has known history of coronary artery disease and coronary stenting. Now presents with episodes of chest pain.  Troponin levels are greater than 4.  We schedule him for heart  catheterization.  We discussed the risks, benefits, options of heart catheterization and angioplasty.  He understands and agrees to proceed..   2.  Hyperlipidemia: Currently on simvastatin 10 mg a day. His last LDL drawn this morning was 132.  The previous LDL was 80. Discontinue the simvastatin and start him on rosuvastatin 10 mg a day.  For questions or updates, please contact [facility] Please consult [email or web address] for contact info under        Signed, Kristeen Miss, MD  11/15/2018, 8:49 AM

## 2018-11-15 NOTE — Progress Notes (Signed)
Report given to 6E floor Nurse.

## 2018-11-15 NOTE — Care Management (Signed)
#  9.   S/W  MIKE  @ [facility] # [phone]   BRILINTA    90 MG BID COVER- YES CO-PAY- $ 395.97 TIER- 3 DRUG PRIOR APPROVAL- NO  DEDUCTIBLE : NOT MET  PREFERRED PHARMACY :  YES [facility]

## 2018-11-15 NOTE — Progress Notes (Signed)
Family Medicine Teaching Service Daily Progress Note Intern Pager: [phone]  Patient name: [name] [name]. Medical record number: [id number] Date of birth: 11/10/1945 Age: 73 y.o. Gender: male  Primary Care Provider: Mechele Claude, MD Consultants: cardiology Code Status: Full  Pt Overview and Major Events to Date:  3/1-admitted for ACS rule out, found to have an STEMI shortly following admission 3/2-taken to the coronary Cath Lab  Assessment and Plan: [name] Netro. is a 73 y.o. male presenting with chest pain. PMH is significant for CAD with 3 prior cardiac catheterizations (most recently 2009).  Found to have work-up positive for NSTEMI.  Plan to take to the Cath Lab on 3/2.  NSTEMI w/ a history of previous PCI x3 Troponin 0.11> 1.27> 4.41.  Cardiology aware, planning to take to Cath Lab on 3/2.  [name] [name] had an additional episode of chest pain overnight requiring nitroglycerin. -Cardiac catheterization planned for 3/2 -Currently n.p.o. -NS at maintenance rate -Heparin drip -ASA 81 -Carvedilol 3.125 mg -Losartan 25 mg -Rosuvastatin 40 mg -Ticagrelor 90mg   HLD Lipid panel was remarkable for cholesterol 190, LDL 132, HDL 43.  He notes previous reaction to atorvastatin. -Rosuvastatin 40 mg, as above  FEN/GI: N.p.o. for procedure, cardiac diet afterward PPx: Heparin drip  Disposition: Possible DC 3/3  Subjective:  [name] [name] had an additional episode of chest pain overnight requiring nitroglycerin.  Additionally, his troponins continued to rise with the most recent troponin being 4.4.  He continues to remark that this feels just like his previous myocardial infarctions and he is surprised he is not already in the Cath Lab.  He has no new comments or concerns at this time.  He would only like to know the time of his cardiac catheterization that he can rest assured that he is getting timely treatment.  Objective: Temp:  [97.4 F (36.3 C)-97.9 F (36.6 C)] 97.4 F  (36.3 C) (03/02 0444) Pulse Rate:  [59-82] 65 (03/02 0444) Resp:  [16-19] 16 (03/02 0444) BP: (108-158)/(65-111) 131/80 (03/02 0444) SpO2:  [94 %-100 %] 94 % (03/02 0444) Weight:  [71.8 kg-72.6 kg] 71.8 kg (03/02 0022)  Physical Exam: General: Alert and cooperative and appears to be in no acute distress HEENT: No elevated JVD.   Cardio: Normal S1 and S2, no S3 or S4. Rhythm is regular. No murmurs or rubs.  Notable dropped beats on exam. Pulm: Clear to auscultation bilaterally, no crackles, wheezing, or diminished breath sounds. Normal respiratory effort Abdomen: Bowel sounds normal. Abdomen soft and non-tender.  Extremities: No peripheral edema. Warm/ well perfused.  Strong radial pulses.  Neuro: Cranial nerves grossly intact   Laboratory: Recent Labs  Lab 11/14/18 1203 11/15/18 0355  WBC 8.5 10.1  HGB 15.3 15.5  HCT 46.3 45.1  PLT 164 176   Recent Labs  Lab 11/14/18 1203  NA 138  K 4.3  CL 106  CO2 25  BUN 18  CREATININE 0.96  CALCIUM 8.9  PROT 6.4*  BILITOT 0.9  ALKPHOS 60  ALT 25  AST 22  GLUCOSE 116*    Troponin 0.11> 1.27> 4.41  Imaging/Diagnostic Tests: No results found.   Mirian Mo, MD 11/15/2018, 6:54 AM PGY-1, [facility] Family Medicine FPTS Intern pager: [phone], text pages welcome

## 2018-11-15 NOTE — Interval H&P Note (Signed)
History and Physical Interval Note:  11/15/2018 10:50 AM  [name] [name].  has presented today for cardiac catheterization, with the diagnosis of NSTEMI. The various methods of treatment have been discussed with the patient and family. After consideration of risks, benefits and other options for treatment, the patient has consented to  Procedure(s): LEFT HEART CATH AND CORONARY ANGIOGRAPHY (N/A) as a surgical intervention .  The patient's history has been reviewed, patient examined, no change in status, stable for surgery.  I have reviewed the patient's chart and labs.  Questions were answered to the patient's satisfaction.    Cath Lab Visit (complete for each Cath Lab visit)  Clinical Evaluation Leading to the Procedure:   ACS: Yes.    Non-ACS:  N/A  [name]

## 2018-11-15 NOTE — Progress Notes (Signed)
ANTICOAGULATION CONSULT NOTE  Pharmacy Consult for Heparin Indication: ACS/ STEMI  Allergies  Allergen Reactions  . Lipitor [Atorvastatin] Other (See Comments)    "Makes pt feel funky"    Patient Measurements: Height: 5\' 7"  (170.2 cm) Weight: 158 lb 3.2 oz (71.8 kg) IBW/kg (Calculated) : 66.1 Heparin Dosing Weight: 72.6 kg  Vital Signs: Temp: 98 F (36.7 C) (03/02 1230) Temp Source: Oral (03/02 1230) BP: 144/75 (03/02 1530) Pulse Rate: 61 (03/02 1530)  Labs: Recent Labs    11/14/18 1203 11/14/18 1548 11/14/18 2107 11/15/18 0355 11/15/18 0947  HGB 15.3  --   --  15.5  --   HCT 46.3  --   --  45.1  --   PLT 164  --   --  176  --   HEPARINUNFRC  --   --   --  0.18*  --   CREATININE 0.96  --   --   --  0.97  TROPONINI  --  0.11* 1.27* 4.41*  --     Estimated Creatinine Clearance: 64.4 mL/min (by C-G formula based on SCr of 0.97 mg/dL).   Medical History: Past Medical History:  Diagnosis Date  . Coronary artery disease   . Erectile dysfunction   . History of hiatal hernia    a lone time ago  . Hyperlipidemia   . Myocardial infarction (HCC)    2x  . Polio    Assessment: 73 yo male with CP to receive heparin drip per pharmacy for ACS/STEMI. Patient not on anticoagulation PTA.   Underwent cardiac cath today finding significant single vessel CAD w/ occluded rPL branch in AV groove. Plan to resume heparin infusion 2 hours after TR band deflation to complete 48-72 hours of IV heparin. TR band deflated at 1630 on 3/2. Will resume at previous rate of 1000 units/hr.   Goal of Therapy:  Heparin level 0.3-0.7 units/ml Monitor platelets by anticoagulation protocol: Yes   Plan:  Restart heparin infusion at 1000 units/hr on 3/2@1830  Daily heparin level and CBC.  Monitor for bleeding.   Sherron Monday, PharmD, BCCCP Clinical Pharmacist  Pager: [phone] Phone: [phone] Please utilize Amion for appropriate phone number to reach the unit pharmacist [facility]  [facility]) 11/15/2018,6:17 PM

## 2018-11-15 NOTE — Progress Notes (Signed)
CRITICAL VALUE ALERT Tropnin Critical Value:  4.41  Date & Time Notied: 11/15/2018, 0550  Provider Notified: Su Hilt, NP-Cards  Orders Received/Actions taken:  No actions taken, will continue monitor patient.

## 2018-11-15 NOTE — Progress Notes (Signed)
Called by Telemetry, patient had 8 beat run of Wide QRS. Upon assessment, patients states feeling fine and just wants to sleep. Will continue to monitor to patient.

## 2018-11-15 NOTE — H&P (View-Only) (Signed)
 Progress Note  Patient Name: [name] J Mcpherson Jr. Date of Encounter: 11/15/2018  Primary Cardiologist: Ely Spragg, MD   Subjective   73-year-old gentleman with a history of coronary artery disease in the past.  He also has hyperlipidemia.  He presented with retrosternal chest pain.  The patient started having symptoms yesterday while he was setting up a ladder.  He had experienced shortness of breath and some mild chest tightness.  He noted that it was very unusual for him to feel such symptoms with minor exertion. Woke up later in the morning around 2:30 AM with some lightheadedness.  He developed some chest pain and left arm pain.  He took several baby aspirin and called 911. Patient was brought to the emergency room.  He received 2 sublingual nitroglycerin at which time the chest tightness resolved.  Inpatient Medications    Scheduled Meds: . aspirin  324 mg Oral Once  . aspirin  81 mg Oral Pre-Cath  . aspirin EC  325 mg Oral Daily  . carvedilol  3.125 mg Oral BID WC  . losartan  25 mg Oral Daily  . simvastatin  10 mg Oral q1800   Continuous Infusions: . sodium chloride     Followed by  . sodium chloride    . heparin 1,000 Units/hr (11/15/18 0538)   PRN Meds: acetaminophen, ondansetron (ZOFRAN) IV   Vital Signs    Vitals:   11/14/18 2248 11/14/18 2250 11/15/18 0022 11/15/18 0444  BP: 110/73 (!) 119/91 108/65 131/80  Pulse: 65 81 65 65  Resp: 16 19 17 16  Temp: 97.8 F (36.6 C)  (!) 97.4 F (36.3 C) (!) 97.4 F (36.3 C)  TempSrc: Oral     SpO2: 99% 99% 95% 94%  Weight:   71.8 kg   Height:   5' 7" (1.702 m)     Intake/Output Summary (Last 24 hours) at 11/15/2018 0849 Last data filed at 11/14/2018 1600 Gross per 24 hour  Intake 360 ml  Output -  Net 360 ml   Last 3 Weights 11/15/2018 11/14/2018 12/30/2017  Weight (lbs) 158 lb 3.2 oz 160 lb 160 lb 2 oz  Weight (kg) 71.759 kg 72.576 kg 72.632 kg      Telemetry    NSR  - Personally Reviewed  ECG    NSR ,  TWI in inf. Leads.   Occasional PVCs  - Personally Reviewed  Physical Exam    GEN: No acute distress.   Neck: No JVD Cardiac: RRR, no murmurs, rubs, or gallops.  Respiratory: Clear to auscultation bilaterally. GI: Soft, nontender, non-distended  MS: No edema; No deformity. Neuro:  Nonfocal  Psych: Normal affect   Labs    Chemistry Recent Labs  Lab 11/14/18 1203  NA 138  K 4.3  CL 106  CO2 25  GLUCOSE 116*  BUN 18  CREATININE 0.96  CALCIUM 8.9  PROT 6.4*  ALBUMIN 3.5  AST 22  ALT 25  ALKPHOS 60  BILITOT 0.9  GFRNONAA >60  GFRAA >60  ANIONGAP 7     Hematology Recent Labs  Lab 11/14/18 1203 11/15/18 0355  WBC 8.5 10.1  RBC 5.12 5.05  HGB 15.3 15.5  HCT 46.3 45.1  MCV 90.4 89.3  MCH 29.9 30.7  MCHC 33.0 34.4  RDW 13.0 13.3  PLT 164 176    Cardiac Enzymes Recent Labs  Lab 11/14/18 1548 11/14/18 2107 11/15/18 0355  TROPONINI 0.11* 1.27* 4.41*    Recent Labs  Lab 11/14/18 1210    TROPIPOC 0.02     BNPNo results for input(s): BNP, PROBNP in the last 168 hours.   DDimer No results for input(s): DDIMER in the last 168 hours.   Radiology    Dg Chest Portable 1 View  Result Date: 11/14/2018 CLINICAL DATA:  Chest pain extending into the left arm EXAM: PORTABLE CHEST 1 VIEW COMPARISON:  05/26/2017 FINDINGS: The heart size and mediastinal contours are within normal limits. Both lungs are clear. The visualized skeletal structures are unremarkable. IMPRESSION: No active disease. Electronically Signed   By: Elige Ko   On: 11/14/2018 12:38    Cardiac Studies      Patient Profile     73 y.o. male with a history of coronary artery disease.  He presents with a non-ST segment elevation myocardial infarction.  Assessment & Plan     Coronary artery disease: The patient has known history of coronary artery disease and coronary stenting. Now presents with episodes of chest pain.  Troponin levels are greater than 4.  We schedule him for heart  catheterization.  We discussed the risks, benefits, options of heart catheterization and angioplasty.  He understands and agrees to proceed..   2.  Hyperlipidemia: Currently on simvastatin 10 mg a day. His last LDL drawn this morning was 132.  The previous LDL was 80. Discontinue the simvastatin and start him on rosuvastatin 10 mg a day.  For questions or updates, please contact [facility] Please consult [email or web address] for contact info under        Signed, Kristeen Miss, MD  11/15/2018, 8:49 AM

## 2018-11-15 NOTE — Progress Notes (Signed)
Echocardiogram 2D Echocardiogram has been performed.  11/15/2018 9:57 AM Gertie Fey, MHA, RVT, RDCS, RDMS

## 2018-11-16 DIAGNOSIS — I2510 Atherosclerotic heart disease of native coronary artery without angina pectoris: Secondary | ICD-10-CM

## 2018-11-16 DIAGNOSIS — E785 Hyperlipidemia, unspecified: Secondary | ICD-10-CM

## 2018-11-16 DIAGNOSIS — I200 Unstable angina: Secondary | ICD-10-CM

## 2018-11-16 DIAGNOSIS — I25119 Atherosclerotic heart disease of native coronary artery with unspecified angina pectoris: Secondary | ICD-10-CM

## 2018-11-16 LAB — BASIC METABOLIC PANEL
Anion gap: 5 (ref 5–15)
BUN: 15 mg/dL (ref 8–23)
CO2: 24 mmol/L (ref 22–32)
Calcium: 8.7 mg/dL — ABNORMAL LOW (ref 8.9–10.3)
Chloride: 109 mmol/L (ref 98–111)
Creatinine, Ser: 0.88 mg/dL (ref 0.61–1.24)
GFR calc Af Amer: >60 mL/min (ref >60)
GFR calc non Af Amer: >60 mL/min (ref >60)
Glucose, Bld: 103 mg/dL — ABNORMAL HIGH (ref 70–99)
Potassium: 4 mmol/L (ref 3.5–5.1)
Sodium: 138 mmol/L (ref 135–145)

## 2018-11-16 LAB — CBC
HCT: 42.5 % (ref 39.0–52.0)
Hemoglobin: 14 g/dL (ref 13.0–17.0)
MCH: 29.3 pg (ref 26.0–34.0)
MCHC: 32.9 g/dL (ref 30.0–36.0)
MCV: 88.9 fL (ref 80.0–100.0)
Platelets: 163 10*3/uL (ref 150–400)
RBC: 4.78 MIL/uL (ref 4.22–5.81)
RDW: 13.2 % (ref 11.5–15.5)
WBC: 8.9 10*3/uL (ref 4.0–10.5)
nRBC: 0 % (ref 0.0–0.2)

## 2018-11-16 LAB — HEPARIN LEVEL (UNFRACTIONATED): Heparin Unfractionated: 0.29 IU/mL — ABNORMAL LOW (ref 0.30–0.70)

## 2018-11-16 MED ORDER — LOSARTAN POTASSIUM 25 MG PO TABS: 25.0000 mg | ORAL_TABLET | Freq: Every day | ORAL | 5 refills | Status: DC

## 2018-11-16 MED ORDER — CARVEDILOL 3.125 MG PO TABS: 3.1250 mg | ORAL_TABLET | Freq: Two times a day (BID) | ORAL | 5 refills | Status: DC

## 2018-11-16 MED ORDER — TICAGRELOR 90 MG PO TABS: 90.0000 mg | ORAL_TABLET | Freq: Two times a day (BID) | ORAL | 3 refills | Status: DC

## 2018-11-16 MED ORDER — TICAGRELOR 90 MG PO TABS: 90.0000 mg | ORAL_TABLET | Freq: Two times a day (BID) | ORAL | 0 refills | Status: DC

## 2018-11-16 MED ORDER — ROSUVASTATIN CALCIUM 10 MG PO TABS: 10.0000 mg | ORAL_TABLET | Freq: Every day | ORAL | 3 refills | Status: DC

## 2018-11-16 MED ORDER — NITROGLYCERIN 0.4 MG SL SUBL: 0.4000 mg | SUBLINGUAL_TABLET | SUBLINGUAL | 2 refills | Status: DC | PRN

## 2018-11-16 MED FILL — Nitroglycerin IV Soln 100 MCG/ML in D5W: INTRA_ARTERIAL | Qty: 10 | Status: AC

## 2018-11-16 NOTE — Progress Notes (Signed)
Patient doing well today without any concern. No new findings on physical exam.  NSTEMI: Stable s/p LHC. Continue DAPT (ASA and Brilinta), beta-blocker, ARBs. Cards to schedule outpatient f/u with them. Otherwise medically stable for d/c home.

## 2018-11-16 NOTE — Progress Notes (Addendum)
Progress Note  Patient Name: [name] [name]. Date of Encounter: 11/16/2018  Primary Cardiologist: Kristeen Miss, MD   Subjective   No complaints this morning.  He denies any recurrent chest pain.  No dyspnea.  Right radial cath access site is stable.  No pain.  Inpatient Medications    Scheduled Meds: . aspirin EC  81 mg Oral Daily  . carvedilol  3.125 mg Oral BID WC  . losartan  25 mg Oral Daily  . rosuvastatin  10 mg Oral q1800  . sodium chloride flush  3 mL Intravenous Q12H  . ticagrelor  90 mg Oral BID   Continuous Infusions: . sodium chloride    . heparin 1,000 Units/hr (11/16/18 0600)   PRN Meds: sodium chloride, acetaminophen, ondansetron (ZOFRAN) IV, sodium chloride flush   Vital Signs    Vitals:   11/15/18 1932 11/15/18 2000 11/16/18 0629 11/16/18 0702  BP: 132/75  132/65 138/87  Pulse: 78  82 79  Resp: (!) 22 (!) 22 12 15   Temp: 97.7 F (36.5 C)  97.7 F (36.5 C) 98 F (36.7 C)  TempSrc: Oral  Oral Oral  SpO2: 96% 96% 94% 94%  Weight:   73 kg   Height:        Intake/Output Summary (Last 24 hours) at 11/16/2018 0756 Last data filed at 11/16/2018 0634 Gross per 24 hour  Intake 580.77 ml  Output 1750 ml  Net -1169.23 ml   Last 3 Weights 11/16/2018 11/15/2018 11/14/2018  Weight (lbs) 160 lb 15 oz 158 lb 3.2 oz 160 lb  Weight (kg) 73 kg 71.759 kg 72.576 kg      Telemetry    Normal sinus rhythm 70s occasional PVCs- Personally Reviewed  ECG    Sinus rhythm.  68 bpm.- Personally Reviewed  Physical Exam   GEN:  Elderly white male in no acute distress.   Neck: No JVD Cardiac: RRR, no murmurs, rubs, or gallops.  Respiratory: Clear to auscultation bilaterally. GI: Soft, nontender, non-distended  MS: No edema; No deformity. Neuro:  Nonfocal  Psych: Normal affect   Labs    Chemistry Recent Labs  Lab 11/14/18 1203 11/15/18 0947 11/16/18 0401  NA 138 140 138  K 4.3 4.3 4.0  CL 106 107 109  CO2 25 26 24   GLUCOSE 116* 119* 103*  BUN 18 17  15   CREATININE 0.96 0.97 0.88  CALCIUM 8.9 9.3 8.7*  PROT 6.4*  --   --   ALBUMIN 3.5  --   --   AST 22  --   --   ALT 25  --   --   ALKPHOS 60  --   --   BILITOT 0.9  --   --   GFRNONAA >60 >60 >60  GFRAA >60 >60 >60  ANIONGAP 7 7 5      Hematology Recent Labs  Lab 11/14/18 1203 11/15/18 0355 11/16/18 0401  WBC 8.5 10.1 8.9  RBC 5.12 5.05 4.78  HGB 15.3 15.5 14.0  HCT 46.3 45.1 42.5  MCV 90.4 89.3 88.9  MCH 29.9 30.7 29.3  MCHC 33.0 34.4 32.9  RDW 13.0 13.3 13.2  PLT 164 176 163    Cardiac Enzymes Recent Labs  Lab 11/14/18 1548 11/14/18 2107 11/15/18 0355  TROPONINI 0.11* 1.27* 4.41*    Recent Labs  Lab 11/14/18 1210  TROPIPOC 0.02     BNPNo results for input(s): BNP, PROBNP in the last 168 hours.   DDimer No results for input(s): DDIMER in  the last 168 hours.   Radiology    Dg Chest Portable 1 View  Result Date: 11/14/2018 CLINICAL DATA:  Chest pain extending into the left arm EXAM: PORTABLE CHEST 1 VIEW COMPARISON:  05/26/2017 FINDINGS: The heart size and mediastinal contours are within normal limits. Both lungs are clear. The visualized skeletal structures are unremarkable. IMPRESSION: No active disease. Electronically Signed   By: Elige Ko   On: 11/14/2018 12:38    Cardiac Studies   Left heart cardiac catheterization 11/15/2018 Procedures   LEFT HEART CATH AND CORONARY ANGIOGRAPHY  Conclusion   Conclusions: 1. Significant single-vessel coronary artery disease with occluded rPL branch in the AV groove likely representing the culprit lesion. 2. Mild to moderate, non-obstructive CAD involving the LAD and LCx. 3. Patent stents in the mid LAD and distal RCA with mild to moderate in-stent restenosis. 4. Moderately reduced left ventricular systolic function (LVEF 35-45%) with basal and mid inferior akinesis and normal left ventricular filling pressure. 5. Aborted PCI to rPL due to inability to cross the lesion with a balloon despite aggressive guide  catheter and wire support.  Wiring of the lesion resulted in restoration of TIMI-2 flow with 99% residual stenosis.  Recommendations: 1. Restart heparin infusion 2 hours after deflation of TR band to complete 48-72 hours of IV heparin from the time of admission. 2. Dual antiplatelet therapy with aspirin and ticagrelor for 12 months. 3. Aggressive secondary prevention.      2D echocardiogram 11/15/2018   1. The left ventricle has mildly reduced systolic function, with an ejection fraction of 45-50%. The cavity size was normal. Left ventricular diastology could not be evaluated due to nondiagnostic images.  2. Moderate hypokinesis of the left ventricular inferolateral wall. There is akinesis of the basal ad mid inferoseptal and inferior walls.  3. The right ventricle has normal systolic function. The cavity was normal. There is no increase in right ventricular wall thickness. Right ventricular systolic pressure could not be assessed.  4. The mitral valve is normal in structure.  5. The tricuspid valve is normal in structure.  6. The pulmonic valve was normal in structure.  7. The aortic valve is tricuspid Mild sclerosis of the aortic valve.   Patient Profile     73 year old gentleman with a history of coronary artery disease in the past.  He also has hyperlipidemia.  He presented with retrosternal chest pain and ruled in for NSTEMI.   Assessment & Plan     1.  CAD/non-STEMI: Known history of CAD with PCI in the past.  Troponin this admission rose to 4.41.  Cardiac catheterization yesterday showed significant single-vessel coronary artery disease with occluded rPL branch in the AV groove likely representing the culprit lesion and mild to moderate, non-obstructive CAD involving the LAD and LCx w/ patent stents in the mid LAD and distal RCA with mild to moderate in-stent restenosis.  PCI of the rPL was attempted but unsuccessful due to inability to cross the lesion with the balloon despite  aggressive guide catheter and wire support.  Wiring of the lesion resulted in restoration of TIMI II flow with 99% residual stenosis.  Plan is for medical therapy.  Recommendations were to continue IV heparin infusion post TR band deflation with recommendations to complete for duration of 48 to 72 hours from the time of admission.  Dual antiplatelet therapy with aspirin and ticagrelor was also recommended x12 months.  He is also on statin therapy with Crestor 10 mg along with beta-blocker, carvedilol 3.125  mg twice daily and ARB, Cozaar 25 mg daily.  Echocardiogram showed mildly reduced left ventricular systolic function with an EF of 45 to 50% with moderate hypokinesis of the left ventricular inferolateral wall and akinesis of the basal and mid inferolateral septal and inferior walls.  He is stable this morning without any recurrent chest pain.  No dyspnea.  Vital signs are stable. NSR on tele.  We will continue medical therapy and plan for patient work with cardiac rehab this morning.  Possible discharge home later today.  Follow-up with Dr. Elease Hashimoto or APP in 2 weeks.  2.  Hyperlipidemia: Lipid panel yesterday showed elevated LDL at 132 mg/dL.  LDL goal in the setting of known CAD is less than 70 mg/dL.  Prior to admission, patient was ordered to take on Crestor 10 mg daily but he reports that he ran out of this 2 months ago.  Will restart Crestor 10 mg nightly.  He will need repeat fasting lipid panel and hepatic function test again in 6 to 8 weeks.  If not at goal, can consider further titration of Crestor to 20 mg if able to tolerate.  If unable to tolerate higher statin titration, can consider addition of Zetia.   3.  Hypertension: Controlled on current regimen.  Will continue home beta-blocker and ARB.  4. Post cardiac catheterization: H&H, renal function and vital signs are stable.  Right radial cath site is stable with 2+ radial pulse.  Plan to ambulate with cardiac rehab later this morning.  Dispo:  Possible discharge home later today.  Follow-up with Dr. Elease Hashimoto or APP in 1 to 2 weeks.  We discussed the [facility] transition of care pharmacy.  Patient would prefer for [place] to send his prescriptions to [facility].   For questions or updates, please contact [facility] Please consult [email or web address] for contact info under        Signed, Robbie Lis, PA-C  11/16/2018, 7:56 AM     Attending Note:   The patient was seen and examined.  Agree with assessment and plan as noted above.  Changes made to the above note as needed.  Patient seen and independently examined with Robbie Lis, PA .   We discussed all aspects of the encounter. I agree with the assessment and plan as stated above.  1.   CAD :  Had a CTO of his distal RCA Dr. Okey Dupre was able to get a wire across the stenosis but a balloon would not cross. He left the lab with TIMI 2 flow. He remains asymptomatic. The plan is ASA and brilinta for at least 2-3 months.   I think we can change him to plavix at that time .   2.  Hyperlipidemia:   He had run out of his crestor . LDL was elevatd.  Have restarted his Crestor .    I have spent a total of 40 minutes with patient reviewing hospital  notes , telemetry, EKGs, labs and examining patient as well as establishing an assessment and plan that was discussed with the patient. > 50% of time was spent in direct patient care.    Vesta Mixer, Montez Hageman., MD, [facility] 11/16/2018, 10:38 AM [address]  [address] Office [phone] Pager [phone]

## 2018-11-16 NOTE — Progress Notes (Signed)
ANTICOAGULATION CONSULT NOTE  Pharmacy Consult for Heparin Indication: ACS/ STEMI  Allergies  Allergen Reactions  . Lipitor [Atorvastatin] Other (See Comments)    "Makes pt feel funky"    Patient Measurements: Height: 5\' 7"  (170.2 cm) Weight: 160 lb 15 oz (73 kg) IBW/kg (Calculated) : 66.1 Heparin Dosing Weight: 72.6 kg  Vital Signs: Temp: 97.7 F (36.5 C) (03/03 0629) Temp Source: Oral (03/03 0629) BP: 132/65 (03/03 0629) Pulse Rate: 82 (03/03 0629)  Labs: Recent Labs    11/14/18 1203 11/14/18 1548 11/14/18 2107 11/15/18 0355 11/15/18 0947 11/16/18 0401  HGB 15.3  --   --  15.5  --  14.0  HCT 46.3  --   --  45.1  --  42.5  PLT 164  --   --  176  --  163  HEPARINUNFRC  --   --   --  0.18*  --  0.29*  CREATININE 0.96  --   --   --  0.97 0.88  TROPONINI  --  0.11* 1.27* 4.41*  --   --     Estimated Creatinine Clearance: 70.9 mL/min (by C-G formula based on SCr of 0.88 mg/dL).   Medical History: Past Medical History:  Diagnosis Date  . Coronary artery disease   . Erectile dysfunction   . History of hiatal hernia    a lone time ago  . Hyperlipidemia   . Myocardial infarction (HCC)    2x  . Polio    Assessment: 73 yo male with CP to receive heparin drip per pharmacy for ACS/STEMI. Patient not on anticoagulation PTA.   Heparin level slightly subtherapeutic this morning s/p restarting heparin after cath yesterday. CBC is stable and within normal limits. No bleeding reported by patient, and no issues with the heparin infusion per RN. Plan to continue heparin infusion to complete 48-72 hours of IV heparin.   Goal of Therapy:  Heparin level 0.3-0.7 units/ml Monitor platelets by anticoagulation protocol: Yes   Plan:  Increase heparin infusion to 1150 units/hr  Check heparin level in 6 hours F/u heparin end time (infusion began 3/1 @1745 ) Daily heparin level and CBC.  Monitor for bleeding   Harlow Mares, PharmD PGY1 Pharmacy Resident Phone [phone]  11/16/2018   7:09 AM

## 2018-11-16 NOTE — Discharge Summary (Addendum)
Discharge Summary    Patient ID: [name] [name]. MRN: [id number]; DOB: [birth year]  Admit date: 11/14/2018 Discharge date: 11/16/2018  Primary Care Provider: Mechele Claude, MD  Primary Cardiologist: Kristeen Miss, MD  Primary Electrophysiologist:  None   Discharge Diagnoses    Active Problems:   Chest pain   NSTEMI (non-ST elevated myocardial infarction) (HCC)   HLD (hyperlipidemia)   CAD (coronary artery disease)   Allergies Allergies  Allergen Reactions  . Lipitor [Atorvastatin] Other (See Comments)    "Makes pt feel funky"    Diagnostic Studies/Procedures    Left heart cardiac catheterization 11/15/2018 Procedures   LEFT HEART CATH AND CORONARY ANGIOGRAPHY  Conclusion   Conclusions: 1. Significant single-vessel coronary artery disease with occluded rPL branch in the AV groove likely representing the culprit lesion. 2. Mild to moderate, non-obstructive CAD involving the LAD and LCx. 3. Patent stents in the mid LAD and distal RCA with mild to moderate in-stent restenosis. 4. Moderately reduced left ventricular systolic function (LVEF 35-45%) with basal and mid inferior akinesis and normal left ventricular filling pressure. 5. Aborted PCI to rPL due to inability to cross the lesion with a balloon despite aggressive guide catheter and wire support. Wiring of the lesion resulted in restoration of TIMI-2 flow with 99% residual stenosis.  Recommendations: 1. Restart heparin infusion 2 hours after deflation of TR band to complete 48-72 hours of IV heparin from the time of admission. 2. Dual antiplatelet therapy with aspirin and ticagrelor for 12 months. 3. Aggressive secondary prevention.      2D echocardiogram 11/15/2018  1. The left ventricle has mildly reduced systolic function, with an ejection fraction of 45-50%. The cavity size was normal. Left ventricular diastology could not be evaluated due to nondiagnostic images. 2. Moderate hypokinesis of the left  ventricular inferolateral wall. There is akinesis of the basal ad mid inferoseptal and inferior walls. 3. The right ventricle has normal systolic function. The cavity was normal. There is no increase in right ventricular wall thickness. Right ventricular systolic pressure could not be assessed. 4. The mitral valve is normal in structure. 5. The tricuspid valve is normal in structure. 6. The pulmonic valve was normal in structure. 7. The aortic valve is tricuspid Mild sclerosis of the aortic valve.    History of Present Illness     73 year old gentleman with a history of coronary artery disease in the past w/ prior PCI. He also has hyperlipidemia and ran out of his Crestor about 2 months ago. He presented to the [facility] ED on 11/14/18 with retrosternal chest and left arm pain concerning for unstable angina and ruled in for NSTEMI. He was brought in by EMS. While en route to the hospital, he was given 2 SL NTG and ASA which abated his symptoms.   Hospital Course    In the ED, full-strength aspirin was administered. CP resolved. Chest x-ray showed no acute cardiopulmonary findings, ECG showed frequent PVCs without ST elevation. His troponin rose to 4.41. He was started on IV heparin and admitted to telemetry. Pt's Crestor was resumed. He was also placed on ASA, a  blocker and ARB.   On 11/15/18, he underwent LHC which showed significant single-vessel coronary artery disease with occluded rPL branch in the AV groove likely representing the culprit lesion and mild to moderate, non-obstructive CAD involving the LAD and LCx w/ patent stents in the mid LAD and distal RCA with mild to moderate in-stent restenosis.  PCI of the rPL was attempted but unsuccessful  due to inability to cross the lesion with the balloon despite aggressive guide catheter and wire support.  Wiring of the lesion resulted in restoration of TIMI II flow with 99% residual stenosis. Medical therapy was recommended. Pt left the cath lab in  stable condition and was monitored in the post cath telemetry unit. After TR band removal, IV heparin was resumed and it was advised that complete a total of 48 hr of heparin therapy. He was started on DAPT w/ ASA and Brilinta, to be continued x 12 months. As noted above, he was also treated w/ a statin, ARB and  blocker. Echo showed mildly reduced LVEF at 45-50%. Lipid panel showed elevated LDL at 132 mg/dl.  He was monitored overnight and had no post cath complications. He denied any recurrent CP. No dyspnea. His vital signs remained stable. His right radical access site was stable w/ 2+ radial pulse. H/H and renal function also stable post cath. He ambulated w/ cardiac rehab w/o difficulty. No arrhthymias on tele. He was last seen and examined by Dr. Elease Hashimoto, who determined he was stable for discharge home.     Per Dr. Elease Hashimoto, he should continue ASA and brilinta for at least 2-3 months. After that, we can consider changing to Plavix. He will also need a repeat FLP and HFTs in 6-8 weeks. LDL goal < 70 mg/dl. Also given new addition of an ARB, Losartan, he will need a f/u BMP at time of post hospital f/u. Post hospital f/u will be arranged in 2 weeks.    Consultants: none   Discharge Vitals Blood pressure 119/70, pulse 73, temperature (!) 97.5 F (36.4 C), temperature source Oral, resp. rate 13, height 5\' 7"  (1.702 m), weight 73 kg, SpO2 93 %.  Filed Weights   11/14/18 1159 11/15/18 0022 11/16/18 0629  Weight: 72.6 kg 71.8 kg 73 kg    Labs & Radiologic Studies    CBC Recent Labs    11/15/18 0355 11/16/18 0401  WBC 10.1 8.9  HGB 15.5 14.0  HCT 45.1 42.5  MCV 89.3 88.9  PLT 176 163   Basic Metabolic Panel Recent Labs    88/28/00 0355 11/15/18 0947 11/16/18 0401  NA  --  140 138  K  --  4.3 4.0  CL  --  107 109  CO2  --  26 24  GLUCOSE  --  119* 103*  BUN  --  17 15  CREATININE  --  0.97 0.88  CALCIUM  --  9.3 8.7*  MG 2.2  --   --    Liver Function Tests Recent Labs     11/14/18 1203  AST 22  ALT 25  ALKPHOS 60  BILITOT 0.9  PROT 6.4*  ALBUMIN 3.5   Recent Labs    11/14/18 1203  LIPASE 38   Cardiac Enzymes Recent Labs    11/14/18 1548 11/14/18 2107 11/15/18 0355  TROPONINI 0.11* 1.27* 4.41*   BNP Invalid input(s): POCBNP D-Dimer No results for input(s): DDIMER in the last 72 hours. Hemoglobin A1C Recent Labs    11/15/18 0355  HGBA1C 5.3   Fasting Lipid Panel Recent Labs    11/15/18 0355  CHOL 190  HDL 43  LDLCALC 132*  TRIG 74  CHOLHDL 4.4   Thyroid Function Tests Recent Labs    11/14/18 2107  TSH 2.098   _____________  Dg Chest Portable 1 View  Result Date: 11/14/2018 CLINICAL DATA:  Chest pain extending into the left arm EXAM: PORTABLE CHEST  1 VIEW COMPARISON:  05/26/2017 FINDINGS: The heart size and mediastinal contours are within normal limits. Both lungs are clear. The visualized skeletal structures are unremarkable. IMPRESSION: No active disease. Electronically Signed   By: Elige Ko   On: 11/14/2018 12:38   Disposition   Pt is being discharged home today in good condition.  Follow-up Plans & Appointments    Follow-up Information    Nahser, Deloris Ping, MD Follow up.   Specialty:  Cardiology Why:  our office will call to arrange hospital follow up with Dr. Elease Hashimoto or his PA/NP in 2-3 weeks  Contact information: [address] [place] [address] [phone]          Discharge Instructions    Amb Referral to Cardiac Rehabilitation   Complete by:  As directed    Diagnosis:   NSTEMI PTCA     Diet - low sodium heart healthy   Complete by:  As directed    Increase activity slowly   Complete by:  As directed       Discharge Medications   Allergies as of 11/16/2018      Reactions   Lipitor [atorvastatin] Other (See Comments)   "Makes pt feel funky"      Medication List    STOP taking these medications   budesonide-formoterol 80-4.5 MCG/ACT inhaler Commonly known as:   SYMBICORT   hydrocortisone cream 0.5 %   methocarbamol 500 MG tablet Commonly known as:  ROBAXIN   sildenafil 20 MG tablet Commonly known as:  REVATIO     TAKE these medications   aspirin 81 MG chewable tablet Chew 81 mg by mouth daily.   carvedilol 3.125 MG tablet Commonly known as:  COREG Take 1 tablet (3.125 mg total) by mouth 2 (two) times daily with a meal.   losartan 25 MG tablet Commonly known as:  COZAAR Take 1 tablet (25 mg total) by mouth daily. Start taking on:  November 17, 2018   nitroGLYCERIN 0.4 MG SL tablet Commonly known as:  NITROSTAT Place 1 tablet (0.4 mg total) under the tongue every 5 (five) minutes as needed for chest pain.   rosuvastatin 10 MG tablet Commonly known as:  CRESTOR Take 1 tablet (10 mg total) by mouth daily.   ticagrelor 90 MG Tabs tablet Commonly known as:  BRILINTA Take 1 tablet (90 mg total) by mouth 2 (two) times daily.   ticagrelor 90 MG Tabs tablet Commonly known as:  BRILINTA Take 1 tablet (90 mg total) by mouth 2 (two) times daily.        Acute coronary syndrome (MI, NSTEMI, STEMI, etc) this admission?: Yes.     AHA/ACC Clinical Performance & Quality Measures: 4. Aspirin prescribed? - Yes 5. ADP Receptor Inhibitor (Plavix/Clopidogrel, Brilinta/Ticagrelor or Effient/Prasugrel) prescribed (includes medically managed patients)? - Yes 6. Beta Blocker prescribed? - Yes 7. High Intensity Statin (Lipitor 40-80mg  or Crestor 20-40mg ) prescribed? - Yes 8. EF assessed during THIS hospitalization? - Yes 9. For EF <40%, was ACEI/ARB prescribed? - Yes 10. For EF <40%, Aldosterone Antagonist (Spironolactone or Eplerenone) prescribed? - Not Applicable (EF >/= 40%) 11. Cardiac Rehab Phase II ordered (Included Medically managed Patients)? - Yes     Outstanding Labs/Studies   BMP at hospital f/u for medication monitoring. ARB initiation FLP and HFTs in 6-8 weeks. LDL goal < 70 mg/dl.   Duration of Discharge Encounter   Greater than  30 minutes including physician time.  Signed, Robbie Lis, PA-C 11/16/2018, 11:43 AM  Attending  Note:   The patient was seen and examined.  Agree with assessment and plan as noted above.  Changes made to the above note as needed.  Patient seen and independently examined with Robbie LisBrittainy Simmons, PA .   We discussed all aspects of the encounter. I agree with the assessment and plan as stated above.  1.   CAD :  Had a CTO of his distal RCA Dr. Okey DupreEnd was able to get a wire across the stenosis but a balloon would not cross. He left the lab with TIMI 2 flow. He remains asymptomatic. The plan is ASA and brilinta for at least 2-3 months.   I think we can change him to plavix at that time .   2.  Hyperlipidemia:   He had run out of his crestor . LDL was elevatd.  Have restarted his Crestor .     I have spent a total of 40 minutes with patient reviewing hospital  notes , telemetry, EKGs, labs and examining patient as well as establishing an assessment and plan that was discussed with the patient. > 50% of time was spent in direct patient care.    Vesta MixerPhilip J. Nahser, Montez HagemanJr., MD, [facility] CenterFACC 11/17/2018, 3:18 PM [address],  [address] Office [phone] Pager [phone]

## 2018-11-16 NOTE — Care Management Note (Signed)
Case Management Note  Patient Details  Name: [name] [name]. MRN: [id number] Date of Birth: [birth year]  Subjective/Objective:   From home, s/p cath, patient will be on brilinta. Patient met NCM in hallway and he was speaking with insurance company on his phone.  NCM went into patient's room with him and spoke with insurance rep, [place].  She states they will fax a form to Dr. Cathie Olden office for him to fill out so they can do an exception, because patient states he should not have a deductible, because he pays the higher premium so that he want have a deductible.  Patient was given the 30 day free coupon and NCM also gave him two samples from HF unit.  NCM informed Dr. Cathie Olden of this as well,  That they will be faxing info to him.  MD states that patient did not have a stent so he may only have to be on brilinta for 30 days and then they may change him to plavix. Patient has 30 day free brilinta and then also two sample bottles of 8 pills.                   Action/Plan: DC home.  Expected Discharge Date:  11/16/18               Expected Discharge Plan:  Home/Self Care  In-House Referral:     Discharge planning Services  CM Consult, Medication Assistance  Post Acute Care Choice:    Choice offered to:     DME Arranged:    DME Agency:     HH Arranged:    HH Agency:     Status of Service:  Completed, signed off  If discussed at [employer] of Stay Meetings, dates discussed:    Additional Comments:  Zenon Mayo, RN 11/16/2018, 1:45 PM

## 2018-11-16 NOTE — Progress Notes (Signed)
CARDIAC REHAB PHASE I   PRE:  Rate/Rhythm: 80 SR PVCs  BP:  Supine:   Sitting: 132/91  Standing:    SaO2:   MODE:  Ambulation: 500 ft   POST:  Rate/Rhythm: 95 SR PVCs  BP:  Supine:   Sitting: 161/83  Standing:    SaO2: 97%RA 0805-0918 Pt walked 500 ft on RA with steady gait and pushing IV pole. No CP. MI education completed with pt who voiced understanding. Stressed importance of brilinta with disease. Reviewed NTG use, MI restrictions, ex ed, watching sodium and heart healthy food choices, CRP 2. Referred to [facility] program but pt doubtful he will attend. 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 Pt knows to notify cardiologist if any recurring CP and to not overdo it as he needs to recover.   Luetta Nutting, RN BSN  11/16/2018 9:12 AM

## 2018-11-17 ENCOUNTER — Telehealth: Payer: Self-pay

## 2018-11-17 NOTE — Telephone Encounter (Signed)
We received a Brilinta Drug Exception Form from [facility]. I have completed the form and placed it in Dr Harvie Bridge mail bin awaiting his signature.

## 2018-11-22 NOTE — Telephone Encounter (Signed)
Received notification from [facility] that the Tier Exception for Brilinta was denied. Per Medicare Part D rules, name brand medications can not be lowered any lower than a Tier 3.   I tried to call pt but he did not answer. No DPR on file so I could not leave a message.

## 2018-11-22 NOTE — Telephone Encounter (Signed)
**Note De-Identified Chosen Garron Obfuscation** Dr Elease Hashimoto has signed the exception form and I have faxed it to [facility].

## 2018-11-30 ENCOUNTER — Telehealth: Payer: Self-pay | Admitting: Physician Assistant

## 2018-11-30 ENCOUNTER — Ambulatory Visit: Payer: Medicare Other | Admitting: Physician Assistant

## 2018-11-30 NOTE — Telephone Encounter (Signed)
Patient is doing well post discharge except mild dyspnea after taking brillinta which has improved significantly. The patient denies nausea, vomiting, fever, chest pain, palpitations, orthopnea, PND, dizziness, syncope, cough, congestion, abdominal pain, hematochezia, melena, lower extremity edema. Walking without any issue.  Plan to change to Plavix in 2-3 month per prior note, might have to do early if cost is issue.  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

## 2018-12-08 ENCOUNTER — Telehealth: Payer: Self-pay | Admitting: Family Medicine

## 2018-12-08 ENCOUNTER — Other Ambulatory Visit: Payer: Self-pay | Admitting: Family Medicine

## 2018-12-08 DIAGNOSIS — L03116 Cellulitis of left lower limb: Secondary | ICD-10-CM

## 2018-12-08 NOTE — Telephone Encounter (Signed)
PT is needing a refill on mupirocin ointment (BACTROBAN) 2 % [Pharmacy Med Name: MUPIROCIN 2% OINTMENT]  Pharmacy [facility]

## 2018-12-08 NOTE — Telephone Encounter (Signed)
Patient dismissed 06/23/18 and this med not on his list   I let [facility] know

## 2018-12-22 ENCOUNTER — Telehealth: Payer: Self-pay | Admitting: Physician Assistant

## 2018-12-22 NOTE — Telephone Encounter (Signed)
   TELEPHONE CALL NOTE  This patient has been deemed a candidate for follow-up tele-health visit to limit community exposure during the Covid-19 pandemic. I spoke with the patient via phone to discuss instructions. This has been outlined on the patient's AVS (dotphrase: hcevisitinfo). The patient was advised to review the section on consent for treatment as well. The patient will receive a phone call 2-3 days prior to their E-Visit at which time consent will be verbally confirmed. A Virtual Office Visit appointment type has been scheduled for hospital follow up with Chelsea Aus, PAC, with "VIDEO" or "TELEPHONE" in the appointment notes - patient prefers VIDEO type.  I have either confirmed the patient is active in MyChart or offered to send sign-up link to phone/email via Mychart icon beside patient's photo.  Manson Passey, [place] 12/22/2018 12:46 PM

## 2018-12-30 ENCOUNTER — Telehealth: Payer: Self-pay | Admitting: Physician Assistant

## 2018-12-30 NOTE — Telephone Encounter (Signed)
Virtual Visit Pre-Appointment Phone Call  Steps For Call:  1. Confirm consent - "In the setting of the current Covid19 crisis, you are scheduled for a (phone or video) visit with your provider on (date) at (time).  Just as we do with many in-office visits, in order for you to participate in this visit, we must obtain consent.  If you'd like, I can send this to your mychart (if signed up) or email for you to review.  Otherwise, I can obtain your verbal consent now.  All virtual visits are billed to your insurance company just like a normal visit would be.  By agreeing to a virtual visit, we'd like you to understand that the technology does not allow for your provider to perform an examination, and thus may limit your provider's ability to fully assess your condition.  Finally, though the technology is pretty good, we cannot assure that it will always work on either your or our end, and in the setting of a video visit, we may have to convert it to a phone-only visit.  In either situation, we cannot ensure that we have a secure connection.  Are you willing to proceed?" STAFF: Did the patient verbally acknowledge consent to telehealth visit? Document YES/NO here: YES  2. Confirm the BEST phone number to call the day of the visit by including in appointment notes  3. Give patient instructions for WebEx/MyChart download to smartphone as below or Doximity/Doxy.me if video visit (depending on what platform provider is using)  4. Advise patient to be prepared with their [name] pressure, heart rate, weight, any heart rhythm information, their current medicines, and a piece of paper and pen handy for any instructions they may receive the day of their visit  5. Inform patient they will receive a phone call 15 minutes prior to their appointment time (may be from unknown caller ID) so they should be prepared to answer  6. Confirm that appointment type is correct in Epic appointment notes (VIDEO vs PHONE)      TELEPHONE CALL NOTE  [name] [name]. has been deemed a candidate for a follow-up tele-health visit to limit community exposure during the Covid-19 pandemic. I spoke with the patient via phone to ensure availability of phone/video source, confirm preferred email & phone number, and discuss instructions and expectations.  I reminded [name] [name]. to be prepared with any vital sign and/or heart rhythm information that could potentially be obtained via home monitoring, at the time of his visit. I reminded [name] [name]. to expect a phone call at the time of his visit if his visit.  Elliot Cousin, RMA 12/30/2018 3:06 PM   INSTRUCTIONS FOR DOWNLOADING THE WEBEX APP TO SMARTPHONE  - If Apple, ask patient to go to [employer] and type in WebEx in the search bar. Download [employer], the blue/green circle. If Android, go to [employer] and type in [employer] in the search bar. The app is free but as with any other app downloads, their phone may require them to verify saved payment information or Apple/Android password.  - The patient does NOT have to create an account. - On the day of the visit, the assist will walk the patient through joining the meeting with the meeting number/password.  INSTRUCTIONS FOR DOWNLOADING THE MYCHART APP TO SMARTPHONE  - The patient must first make sure to have activated MyChart and know their login information - If Apple, go to [employer]  and type in MyChart in the search bar and download the app. If Android, ask patient to go to [employer] and type in [place] in the search bar and download the app. The app is free but as with any other app downloads, their phone may require them to verify saved payment information or Apple/Android password.  - The patient will need to then log into the app with their MyChart username and password, and select [facility] as their healthcare provider to link the account. When it is time for your visit, go to  the MyChart app, find appointments, and click Begin Video Visit. Be sure to Select Allow for your device to access the Microphone and Camera for your visit. You will then be connected, and your provider will be with you shortly.  **If they have any issues connecting, or need assistance please contact MyChart service desk [phone])**  **If using a computer, in order to ensure the best quality for their visit they will need to use either of the following Internet Browsers: [employer], or Google Chrome**  IF USING DOXIMITY or DOXY.ME - The patient will receive a link just prior to their visit, either by text or email (to be determined day of appointment depending on if it's doxy.me or Doximity).     FULL LENGTH CONSENT FOR TELE-HEALTH VISIT   I hereby voluntarily request, consent and authorize [facility] and its employed or contracted physicians, physician assistants, nurse practitioners or other licensed health care professionals (the Practitioner), to provide me with telemedicine health care services (the "Services") as deemed necessary by the treating Practitioner. I acknowledge and consent to receive the Services by the Practitioner via telemedicine. I understand that the telemedicine visit will involve communicating with the Practitioner through live audiovisual communication technology and the disclosure of certain medical information by electronic transmission. I acknowledge that I have been given the opportunity to request an in-person assessment or other available alternative prior to the telemedicine visit and am voluntarily participating in the telemedicine visit.  I understand that I have the right to withhold or withdraw my consent to the use of telemedicine in the course of my care at any time, without affecting my right to future care or treatment, and that the Practitioner or I may terminate the telemedicine visit at any time. I understand that I have the right to  inspect all information obtained and/or recorded in the course of the telemedicine visit and may receive copies of available information for a reasonable fee.  I understand that some of the potential risks of receiving the Services via telemedicine include:  [name] Delay or interruption in medical evaluation due to technological equipment failure or disruption; . Information transmitted may not be sufficient (e.g. poor resolution of images) to allow for appropriate medical decision making by the Practitioner; and/or  . In rare instances, security protocols could fail, causing a breach of personal health information.  Furthermore, I acknowledge that it is my responsibility to provide information about my medical history, conditions and care that is complete and accurate to the best of my ability. I acknowledge that Practitioner's advice, recommendations, and/or decision may be based on factors not within their control, such as incomplete or inaccurate data provided by me or distortions of diagnostic images or specimens that may result from electronic transmissions. I understand that the practice of medicine is not an exact science and that Practitioner makes no warranties or guarantees regarding treatment outcomes. I acknowledge that I will  receive a copy of this consent concurrently upon execution via email to the email address I last provided but may also request a printed copy by calling the office of [facility].    I understand that my insurance will be billed for this visit.   I have read or had this consent read to me. . I understand the contents of this consent, which adequately explains the benefits and risks of the Services being provided via telemedicine.  . I have been provided ample opportunity to ask questions regarding this consent and the Services and have had my questions answered to my satisfaction. . I give my informed consent for the services to be provided through the use of  telemedicine in my medical care  By participating in this telemedicine visit I agree to the above.

## 2018-12-30 NOTE — Telephone Encounter (Signed)
Spoke with patient who confirmed all demographics. Actively uses My Chart. Will have vitals ready for visit.

## 2019-01-03 NOTE — Progress Notes (Signed)
Virtual Visit via Telephone Note   This visit type was conducted due to national recommendations for restrictions regarding the COVID-19 Pandemic (e.g. social distancing) in an effort to limit this patient's exposure and mitigate transmission in our community.  Due to his co-morbid illnesses, this patient is at least at moderate risk for complications without adequate follow up.  This format is felt to be most appropriate for this patient at this time.  The patient did not have access to video technology/had technical difficulties with video requiring transitioning to audio format only (telephone).  All issues noted in this document were discussed and addressed.  No physical exam could be performed with this format.  Please refer to the patient's chart for his  consent to telehealth for [facility].   Evaluation Performed:  Follow-up visit  Date:  01/04/2019   ID:  [name] BloodEugene J Benedicto Jr., DOB December 07, 1945, MRN [id number]  Patient Location: Home Provider Location: Home  Attempted video call multiple times however change it to telephone visit due to technical difficulties.   PCP:  Mechele ClaudeStacks, [name], MD  Cardiologist:  Kristeen MissPhilip Nahser, MD Chief Complaint:  Hospital follow up  History of Present Illness:    [name] Bloodugene J Yazzie Jr. is a 73 y.o. male with hx of CAD, HLD and polio presents for hospital follow up.   The patient does not have symptoms concerning for COVID-19 infection (fever, chills, cough, or new shortness of breath).   Admitted 11/2018 with NSTEMI. Cath showed patent stents in the mid LAD and distal RCA with mild to moderate in-stent restenosis.  Significant single-vessel coronary artery disease with occluded rPL branch in the AV groove likely representing the culprit lesion. PCI of the rPL was attempted but unsuccessful due to inability to cross the lesion with the balloon despite aggressive guide catheter and wire support. Wiring of the lesion resulted in restoration of TIMI II flow with  99% residual stenosis. Medical therapy was recommended. IV heparin continued for 48 hours. He was started on DAPT w/ ASA and Brilinta, to be continued x 12 months. As noted above, he was also treated w/ a statin, ARB and ? blocker. Echo showed mildly reduced LVEF at 45-50%. Lipid panel showed elevated LDL at 132 mg/dl.  Mr. Alwyn RenHopper taking Brillinta 90mg  only once a day due to dyspnea.  He has not started his losartan since discharge.  He thinks he is not needed.  He denies exertional shortness of breath or chest pain.  He is active doing yard work.  Denies palpitation, dizziness, lower extremity edema, orthopnea, PND, syncope or melena.  Compliant with low-sodium diet.  Past Medical History:  Diagnosis Date  . Coronary artery disease   . Erectile dysfunction   . History of hiatal hernia    a lone time ago  . Hyperlipidemia   . Myocardial infarction (HCC)    2x  . Polio    Past Surgical History:  Procedure Laterality Date  . APPENDECTOMY    . CARDIAC CATHETERIZATION     has two stents placed  . CERVICAL SPINE SURGERY     c3-4, 4-5,  5-6, 8 screws and plates  . CORONARY STENT PLACEMENT    . forein body removal     metal sharp removed from right leg  . LEFT HEART CATH AND CORONARY ANGIOGRAPHY N/A 11/15/2018   Procedure: LEFT HEART CATH AND CORONARY ANGIOGRAPHY;  Surgeon: Yvonne KendallEnd, Christopher, MD;  [facility];  Service: Cardiovascular;  Laterality: N/A;  Current Meds  Medication Sig  . aspirin 81 MG chewable tablet Chew 81 mg by mouth daily.  . carvedilol (COREG) 3.125 MG tablet Take 1 tablet (3.125 mg total) by mouth 2 (two) times daily with a meal.  . nitroGLYCERIN (NITROSTAT) 0.4 MG SL tablet Place 1 tablet (0.4 mg total) under the tongue every 5 (five) minutes as needed for chest pain.  . rosuvastatin (CRESTOR) 10 MG tablet Take 1 tablet (10 mg total) by mouth daily.  . [DISCONTINUED] ticagrelor (BRILINTA) 90 MG TABS tablet Take 1 tablet (90 mg total) by mouth 2  (two) times daily.     Allergies:   Lipitor [atorvastatin]   Social History   Tobacco Use  . Smoking status: Former Games developer  . Smokeless tobacco: Never Used  Substance Use Topics  . Alcohol use: Yes    Alcohol/week: 21.0 standard drinks    Types: 21 Cans of beer per week  . Drug use: No     Family Hx: The patient's family history includes Diabetes in his mother; Heart disease in his father and mother.  ROS:   Please see the history of present illness.    All other systems reviewed and are negative.   Prior CV studies:   The following studies were reviewed today:  Left heart cardiac catheterization 11/15/2018 Procedures   LEFT HEART CATH AND CORONARY ANGIOGRAPHY  Conclusion   Conclusions: 1. Significant single-vessel coronary artery disease with occluded rPL branch in the AV groove likely representing the culprit lesion. 2. Mild to moderate, non-obstructive CAD involving the LAD and LCx. 3. Patent stents in the mid LAD and distal RCA with mild to moderate in-stent restenosis. 4. Moderately reduced left ventricular systolic function (LVEF 35-45%) with basal and mid inferior akinesis and normal left ventricular filling pressure. 5. Aborted PCI to rPL due to inability to cross the lesion with a balloon despite aggressive guide catheter and wire support. Wiring of the lesion resulted in restoration of TIMI-2 flow with 99% residual stenosis.  Recommendations: 1. Restart heparin infusion 2 hours after deflation of TR band to complete 48-72 hours of IV heparin from the time of admission. 2. Dual antiplatelet therapy with aspirin and ticagrelor for 12 months. 3. Aggressive secondary prevention.      2D echocardiogram 11/15/2018  1. The left ventricle has mildly reduced systolic function, with an ejection fraction of 45-50%. The cavity size was normal. Left ventricular diastology could not be evaluated due to nondiagnostic images. 2. Moderate hypokinesis of the left  ventricular inferolateral wall. There is akinesis of the basal ad mid inferoseptal and inferior walls. 3. The right ventricle has normal systolic function. The cavity was normal. There is no increase in right ventricular wall thickness. Right ventricular systolic pressure could not be assessed. 4. The mitral valve is normal in structure. 5. The tricuspid valve is normal in structure. 6. The pulmonic valve was normal in structure. 7. The aortic valve is tricuspid Mild sclerosis of the aortic valve.   Labs/Other Tests and Data Reviewed:    EKG:  No ECG reviewed.  Recent Labs: 11/14/2018: ALT 25; TSH 2.098 11/15/2018: Magnesium 2.2 11/16/2018: BUN 15; Creatinine, Ser 0.88; Hemoglobin 14.0; Platelets 163; Potassium 4.0; Sodium 138   Recent Lipid Panel Lab Results  Component Value Date/Time   CHOL 190 11/15/2018 03:55 AM   CHOL 132 08/17/2017 08:00 AM   TRIG 74 11/15/2018 03:55 AM   HDL 43 11/15/2018 03:55 AM   HDL 31 (L) 08/17/2017 08:00 AM   CHOLHDL  4.4 11/15/2018 03:55 AM   LDLCALC 132 (H) 11/15/2018 03:55 AM   LDLCALC 80 08/17/2017 08:00 AM    Wt Readings from Last 3 Encounters:  01/04/19 160 lb (72.6 kg)  11/16/18 160 lb 15 oz (73 kg)  12/30/17 160 lb 2 oz (72.6 kg)     Objective:    Vital Signs:  Ht 5\' 7"  (1.702 m)   Wt 160 lb (72.6 kg)   BMI 25.06 kg/m    VITAL SIGNS:  reviewed GEN:  no acute distress RESPIRATORY:  normal respiratory effort, symmetric expansion NEURO:  alert and oriented x 3 PSYCH:  normal affect  ASSESSMENT & PLAN:    1. CAD - Cath 11/2018 as above. Recommended medical therapy.  No angina.  Continue aspirin, statin and beta-blocker.  Change Brilinta to Plavix due to dyspnea.  2. Chronic systolic CHF - LVEF of 45-50%.  No CHF symptoms.  Review heart failure and guideline directed medical therapy in detail.  Continue beta-blocker.  He will restart his losartan.  3. HLD - 11/15/2018: Cholesterol 190; HDL 43; LDL Cholesterol 132; Triglycerides 74;  VLDL 15 intolerance to Lipitor.  Tolerating low-dose Crestor.  Recheck labs in few weeks.  COVID-19 Education: The signs and symptoms of COVID-19 were discussed with the patient and how to seek care for testing (follow up with PCP or arrange E-visit).  The importance of social distancing was discussed today.  Time:   Today, I have spent 5-10 minutes with the patient with telehealth technology discussing the above problems.     Medication Adjustments/Labs and Tests Ordered: Current medicines are reviewed at length with the patient today.  Concerns regarding medicines are outlined above.   Tests Ordered: Orders Placed This Encounter  Procedures  . Lipid panel  . Hepatic function panel    Medication Changes: Meds ordered this encounter  Medications  . clopidogrel (PLAVIX) 75 MG tablet    Sig: Take 4 tablets by mouth on day 1, take 1 tablet by mouth daily thereafter    Dispense:  94 tablet    Refill:  3    Disposition:  Follow up in 4 month(s)  Signed, Manson Passey, PA  01/04/2019 8:54 AM    [facility]

## 2019-01-04 ENCOUNTER — Encounter: Payer: Self-pay | Admitting: Physician Assistant

## 2019-01-04 ENCOUNTER — Other Ambulatory Visit: Payer: Self-pay

## 2019-01-04 ENCOUNTER — Telehealth (INDEPENDENT_AMBULATORY_CARE_PROVIDER_SITE_OTHER): Payer: Medicare Other | Admitting: Physician Assistant

## 2019-01-04 VITALS — Ht 67.0 in | Wt 160.0 lb

## 2019-01-04 DIAGNOSIS — Z7189 Other specified counseling: Secondary | ICD-10-CM

## 2019-01-04 DIAGNOSIS — I2510 Atherosclerotic heart disease of native coronary artery without angina pectoris: Secondary | ICD-10-CM

## 2019-01-04 DIAGNOSIS — I5022 Chronic systolic (congestive) heart failure: Secondary | ICD-10-CM

## 2019-01-04 DIAGNOSIS — E782 Mixed hyperlipidemia: Secondary | ICD-10-CM

## 2019-01-04 MED ORDER — CLOPIDOGREL BISULFATE 75 MG PO TABS: ORAL_TABLET | ORAL | 3 refills | Status: DC

## 2019-01-04 NOTE — Patient Instructions (Signed)
Medication Instructions:  Your physician has recommended you make the following change in your medication:  1.  STOP the Brilinta 2.  START Plavix 75 mg taking 4 tablets by mouth on day 1 then 1 tablet daily thereafter    If you need a refill on your cardiac medications before your next appointment, please call your pharmacy.   Lab work: 2 MONTHS:  FASTING LIPIDS & LFT  If you have labs (blood work) drawn today and your tests are completely normal, you will receive your results only by: [name] MyChart Message (if you have MyChart) OR . A paper copy in the mail If you have any lab test that is abnormal or we need to change your treatment, we will call you to review the results.  Testing/Procedures: None ordered   Follow-Up: At [facility], you and your health needs are our priority.  As part of our continuing mission to provide you with exceptional heart care, we have created designated Provider Care Teams.  These Care Teams include your primary Cardiologist (physician) and Advanced Practice Providers (APPs -  Physician Assistants and Nurse Practitioners) who all work together to provide you with the care you need, when you need it. You will need a follow up appointment in:  4 months.  Please call our office 2 months in advance to schedule this appointment.  You may see Kristeen Miss, MD or one of the following Advanced Practice Providers on your designated Care Team: Tereso Newcomer, PA-C [place], [place] . Berton Bon, NP  Any Other Special Instructions Will Be Listed Below (If Applicable).

## 2019-04-13 ENCOUNTER — Other Ambulatory Visit: Payer: Self-pay

## 2019-10-18 ENCOUNTER — Other Ambulatory Visit: Payer: Self-pay | Admitting: Family Medicine

## 2019-10-18 NOTE — Telephone Encounter (Signed)
Pt NTBS LOV 12/30/2017

## 2019-12-21 ENCOUNTER — Ambulatory Visit: Payer: Medicare Other | Admitting: Cardiovascular Disease

## 2019-12-21 ENCOUNTER — Other Ambulatory Visit: Payer: Medicare Other

## 2020-01-04 ENCOUNTER — Ambulatory Visit: Payer: Medicare Other | Admitting: Cardiovascular Disease

## 2020-01-26 ENCOUNTER — Encounter: Payer: Self-pay | Admitting: Cardiovascular Disease
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
## 2020-01-27 ENCOUNTER — Other Ambulatory Visit: Payer: Self-pay

## 2020-01-27 ENCOUNTER — Encounter: Payer: Self-pay | Admitting: Cardiovascular Disease

## 2020-01-27 ENCOUNTER — Telehealth: Payer: Self-pay | Admitting: Nurse Practitioner

## 2020-01-27 ENCOUNTER — Ambulatory Visit (INDEPENDENT_AMBULATORY_CARE_PROVIDER_SITE_OTHER): Payer: Medicare Other | Admitting: Cardiovascular Disease

## 2020-01-27 VITALS — BP 128/74 | HR 56 | Ht 67.0 in | Wt 163.5 lb

## 2020-01-27 DIAGNOSIS — E782 Mixed hyperlipidemia: Secondary | ICD-10-CM

## 2020-01-27 DIAGNOSIS — I5022 Chronic systolic (congestive) heart failure: Secondary | ICD-10-CM

## 2020-01-27 DIAGNOSIS — I2510 Atherosclerotic heart disease of native coronary artery without angina pectoris: Secondary | ICD-10-CM

## 2020-01-27 LAB — HEPATIC FUNCTION PANEL
ALT: 18 IU/L (ref 0–44)
AST: 17 IU/L (ref 0–40)
Albumin: 4 g/dL (ref 3.7–4.7)
Alkaline Phosphatase: 74 IU/L (ref 39–117)
Bilirubin Total: 0.5 mg/dL (ref 0.0–1.2)
Bilirubin, Direct: 0.16 mg/dL (ref 0.00–0.40)
Total Protein: 6.4 g/dL (ref 6.0–8.5)

## 2020-01-27 LAB — LIPID PANEL
Chol/HDL Ratio: 4.4 ratio (ref 0.0–5.0)
Cholesterol, Total: 171 mg/dL (ref 100–199)
HDL: 39 mg/dL — ABNORMAL LOW (ref >39)
LDL Chol Calc (NIH): 105 mg/dL — ABNORMAL HIGH (ref 0–99)
Triglycerides: 150 mg/dL — ABNORMAL HIGH (ref 0–149)
VLDL Cholesterol Cal: 27 mg/dL (ref 5–40)

## 2020-01-27 LAB — BASIC METABOLIC PANEL
BUN/Creatinine Ratio: 26 — ABNORMAL HIGH (ref 10–24)
BUN: 22 mg/dL (ref 8–27)
CO2: 23 mmol/L (ref 20–29)
Calcium: 9.1 mg/dL (ref 8.6–10.2)
Chloride: 105 mmol/L (ref 96–106)
Creatinine, Ser: 0.86 mg/dL (ref 0.76–1.27)
GFR calc Af Amer: 99 mL/min/{1.73_m2} (ref >59)
GFR calc non Af Amer: 85 mL/min/{1.73_m2} (ref >59)
Glucose: 70 mg/dL (ref 65–99)
Potassium: 4 mmol/L (ref 3.5–5.2)
Sodium: 141 mmol/L (ref 134–144)

## 2020-01-27 MED ORDER — ROSUVASTATIN CALCIUM 20 MG PO TABS: 20.0000 mg | ORAL_TABLET | Freq: Every day | ORAL | 3 refills | Status: DC

## 2020-01-27 NOTE — Patient Instructions (Signed)
Medication Instructions:  Your physician recommends that you continue on your current medications as directed. Please refer to the Current Medication list given to you today.  *If you need a refill on your cardiac medications before your next appointment, please call your pharmacy*   Lab Work: TODAY - cholesterol, liver panel, basic metabolic panel If you have labs (blood work) drawn today and your tests are completely normal, you will receive your results only by: . MyChart Message (if you have MyChart) OR . A paper copy in the mail If you have any lab test that is abnormal or we need to change your treatment, we will call you to review the results.   Testing/Procedures: None Ordered   Follow-Up: At [facility], you and your health needs are our priority.  As part of our continuing mission to provide you with exceptional heart care, we have created designated Provider Care Teams.  These Care Teams include your primary Cardiologist (physician) and Advanced Practice Providers (APPs -  Physician Assistants and Nurse Practitioners) who all work together to provide you with the care you need, when you need it.  We recommend signing up for the patient portal called "MyChart".  Sign up information is provided on this After Visit Summary.  MyChart is used to connect with patients for Virtual Visits (Telemedicine).  Patients are able to view lab/test results, encounter notes, upcoming appointments, etc.  Non-urgent messages can be sent to your provider as well.   To learn more about what you can do with MyChart, go to [email or web address].com.    Your next appointment:   1 year(s)  The format for your next appointment:   In Person  Provider:   You may see Philip Nahser, MD or one of the following Advanced Practice Providers on your designated Care Team:    Scott Weaver, PA-C  Vin Bhagat, PA-C     

## 2020-01-27 NOTE — Telephone Encounter (Signed)
-----   Message from Vesta Mixer, MD sent at 01/27/2020  4:54 PM EDT ----- LDL is 105.   His goal is < 70  Lets increase his crestor to 20 mg a day .  Recheck lipids, liver, bmp in 3 months

## 2020-01-27 NOTE — Telephone Encounter (Signed)
Results and plan of care reviewed with patient who verbalized understanding and agreement to increase rosuvastatin to 20 mg daily. He is scheduled for repeat lab work on 8/17 and I advised him to call back prior to that appointment with questions or concerns. He thanked me for the call.

## 2020-03-13 ENCOUNTER — Other Ambulatory Visit: Payer: Self-pay

## 2020-03-13 ENCOUNTER — Emergency Department (HOSPITAL_COMMUNITY): Payer: Medicare Other

## 2020-03-13 ENCOUNTER — Inpatient Hospital Stay (HOSPITAL_COMMUNITY)
Admission: EM | Admit: 2020-03-13 | Discharge: 2020-03-15 | DRG: 142 | Disposition: A | Discharge status: Home / Self Care | Payer: Medicare Other | Attending: Otolaryngology | Admitting: Otolaryngology

## 2020-03-13 ENCOUNTER — Encounter (HOSPITAL_COMMUNITY): Payer: Self-pay | Admitting: Emergency Medicine

## 2020-03-13 DIAGNOSIS — S02412B LeFort II fracture, initial encounter for open fracture: Principal | ICD-10-CM

## 2020-03-13 DIAGNOSIS — Z20822 Contact with and (suspected) exposure to covid-19: Secondary | ICD-10-CM | POA: Diagnosis present

## 2020-03-13 DIAGNOSIS — S0121XA Laceration without foreign body of nose, initial encounter: Secondary | ICD-10-CM | POA: Diagnosis not present

## 2020-03-13 DIAGNOSIS — T8189XA Other complications of procedures, not elsewhere classified, initial encounter: Secondary | ICD-10-CM | POA: Diagnosis present

## 2020-03-13 DIAGNOSIS — I252 Old myocardial infarction: Secondary | ICD-10-CM | POA: Diagnosis not present

## 2020-03-13 DIAGNOSIS — S51011A Laceration without foreign body of right elbow, initial encounter: Secondary | ICD-10-CM | POA: Diagnosis not present

## 2020-03-13 DIAGNOSIS — Z87891 Personal history of nicotine dependence: Secondary | ICD-10-CM | POA: Diagnosis not present

## 2020-03-13 DIAGNOSIS — R58 Hemorrhage, not elsewhere classified: Secondary | ICD-10-CM | POA: Diagnosis not present

## 2020-03-13 DIAGNOSIS — R52 Pain, unspecified: Secondary | ICD-10-CM | POA: Diagnosis not present

## 2020-03-13 DIAGNOSIS — E782 Mixed hyperlipidemia: Secondary | ICD-10-CM | POA: Diagnosis present

## 2020-03-13 DIAGNOSIS — S0292XA Unspecified fracture of facial bones, initial encounter for closed fracture: Secondary | ICD-10-CM | POA: Diagnosis not present

## 2020-03-13 DIAGNOSIS — Z955 Presence of coronary angioplasty implant and graft: Secondary | ICD-10-CM | POA: Diagnosis not present

## 2020-03-13 DIAGNOSIS — J3489 Other specified disorders of nose and nasal sinuses: Secondary | ICD-10-CM | POA: Diagnosis not present

## 2020-03-13 DIAGNOSIS — Z7982 Long term (current) use of aspirin: Secondary | ICD-10-CM | POA: Diagnosis not present

## 2020-03-13 DIAGNOSIS — T07XXXA Unspecified multiple injuries, initial encounter: Secondary | ICD-10-CM | POA: Diagnosis not present

## 2020-03-13 DIAGNOSIS — S01511A Laceration without foreign body of lip, initial encounter: Secondary | ICD-10-CM | POA: Diagnosis present

## 2020-03-13 DIAGNOSIS — S0181XA Laceration without foreign body of other part of head, initial encounter: Secondary | ICD-10-CM

## 2020-03-13 DIAGNOSIS — J439 Emphysema, unspecified: Secondary | ICD-10-CM | POA: Diagnosis not present

## 2020-03-13 DIAGNOSIS — Z79899 Other long term (current) drug therapy: Secondary | ICD-10-CM

## 2020-03-13 DIAGNOSIS — Z7902 Long term (current) use of antithrombotics/antiplatelets: Secondary | ICD-10-CM | POA: Diagnosis not present

## 2020-03-13 DIAGNOSIS — I2510 Atherosclerotic heart disease of native coronary artery without angina pectoris: Secondary | ICD-10-CM | POA: Diagnosis present

## 2020-03-13 DIAGNOSIS — M47812 Spondylosis without myelopathy or radiculopathy, cervical region: Secondary | ICD-10-CM | POA: Diagnosis not present

## 2020-03-13 DIAGNOSIS — J320 Chronic maxillary sinusitis: Secondary | ICD-10-CM | POA: Diagnosis not present

## 2020-03-13 DIAGNOSIS — R0902 Hypoxemia: Secondary | ICD-10-CM | POA: Diagnosis not present

## 2020-03-13 DIAGNOSIS — R519 Headache, unspecified: Secondary | ICD-10-CM | POA: Diagnosis present

## 2020-03-13 DIAGNOSIS — I6381 Other cerebral infarction due to occlusion or stenosis of small artery: Secondary | ICD-10-CM | POA: Diagnosis not present

## 2020-03-13 DIAGNOSIS — J323 Chronic sphenoidal sinusitis: Secondary | ICD-10-CM | POA: Diagnosis not present

## 2020-03-13 DIAGNOSIS — S199XXA Unspecified injury of neck, initial encounter: Secondary | ICD-10-CM | POA: Diagnosis not present

## 2020-03-13 DIAGNOSIS — R0689 Other abnormalities of breathing: Secondary | ICD-10-CM | POA: Diagnosis not present

## 2020-03-13 LAB — CBC WITH DIFFERENTIAL/PLATELET
Abs Immature Granulocytes: 0.04 10*3/uL (ref 0.00–0.07)
Basophils Absolute: 0.1 10*3/uL (ref 0.0–0.1)
Basophils Relative: 1 %
Eosinophils Absolute: 0.3 10*3/uL (ref 0.0–0.5)
Eosinophils Relative: 3 %
HCT: 45 % (ref 39.0–52.0)
Hemoglobin: 15.2 g/dL (ref 13.0–17.0)
Immature Granulocytes: 0 %
Lymphocytes Relative: 13 %
Lymphs Abs: 1.4 10*3/uL (ref 0.7–4.0)
MCH: 31.6 pg (ref 26.0–34.0)
MCHC: 33.8 g/dL (ref 30.0–36.0)
MCV: 93.6 fL (ref 80.0–100.0)
Monocytes Absolute: 1 10*3/uL (ref 0.1–1.0)
Monocytes Relative: 9 %
Neutro Abs: 7.9 10*3/uL — ABNORMAL HIGH (ref 1.7–7.7)
Neutrophils Relative %: 74 %
Platelets: 202 10*3/uL (ref 150–400)
RBC: 4.81 MIL/uL (ref 4.22–5.81)
RDW: 13.2 % (ref 11.5–15.5)
WBC: 10.6 10*3/uL — ABNORMAL HIGH (ref 4.0–10.5)
nRBC: 0 % (ref 0.0–0.2)

## 2020-03-13 LAB — BASIC METABOLIC PANEL
Anion gap: 8 (ref 5–15)
BUN: 35 mg/dL — ABNORMAL HIGH (ref 8–23)
CO2: 25 mmol/L (ref 22–32)
Calcium: 9.1 mg/dL (ref 8.9–10.3)
Chloride: 108 mmol/L (ref 98–111)
Creatinine, Ser: 1.07 mg/dL (ref 0.61–1.24)
GFR calc Af Amer: >60 mL/min (ref >60)
GFR calc non Af Amer: >60 mL/min (ref >60)
Glucose, Bld: 107 mg/dL — ABNORMAL HIGH (ref 70–99)
Potassium: 4.1 mmol/L (ref 3.5–5.1)
Sodium: 141 mmol/L (ref 135–145)

## 2020-03-13 LAB — PROTIME-INR
INR: 1.1 (ref 0.8–1.2)
Prothrombin Time: 13.4 seconds (ref 11.4–15.2)

## 2020-03-13 LAB — SARS CORONAVIRUS 2 BY RT PCR (HOSPITAL ORDER, PERFORMED IN ~~LOC~~ HOSPITAL LAB): SARS Coronavirus 2: NEGATIVE

## 2020-03-13 LAB — RAPID HIV SCREEN (HIV 1/2 AB+AG)
HIV 1/2 Antibodies: NONREACTIVE
HIV-1 P24 Antigen - HIV24: NONREACTIVE

## 2020-03-13 MED ORDER — ACETAMINOPHEN 325 MG PO TABS: 650.0000 mg | ORAL_TABLET | Freq: Four times a day (QID) | ORAL | Status: DC | PRN

## 2020-03-13 MED ORDER — MORPHINE SULFATE (PF) 4 MG/ML IV SOLN
4.0000 mg | Freq: Once | INTRAVENOUS | Status: AC
  Administered 2020-03-13: 4 mg via INTRAVENOUS

## 2020-03-13 MED ORDER — LIDOCAINE HCL (PF) 2 % IJ SOLN
INTRAMUSCULAR | Status: AC
  Filled 2020-03-13: qty 40

## 2020-03-13 MED ORDER — MORPHINE SULFATE (PF) 4 MG/ML IV SOLN
INTRAVENOUS | Status: AC
  Filled 2020-03-13: qty 1

## 2020-03-13 MED ORDER — CEFAZOLIN SODIUM-DEXTROSE 1-4 GM/50ML-% IV SOLN
1.0000 g | Freq: Once | INTRAVENOUS | Status: AC
  Administered 2020-03-13: 1 g via INTRAVENOUS
  Filled 2020-03-13: qty 50

## 2020-03-13 MED ORDER — LIDOCAINE HCL (PF) 2 % IJ SOLN
20.0000 mL | Freq: Once | INTRAMUSCULAR | Status: AC
  Administered 2020-03-13: 20 mL

## 2020-03-13 MED ORDER — MORPHINE SULFATE (PF) 4 MG/ML IV SOLN
4.0000 mg | Freq: Once | INTRAVENOUS | Status: AC
  Administered 2020-03-13: 4 mg via INTRAVENOUS
  Filled 2020-03-13: qty 1

## 2020-03-13 MED ORDER — ACETAMINOPHEN 650 MG RE SUPP: 650.0000 mg | Freq: Four times a day (QID) | RECTAL | Status: DC | PRN

## 2020-03-13 MED ORDER — HYDROCODONE-ACETAMINOPHEN 5-325 MG PO TABS
1.0000 | ORAL_TABLET | ORAL | Status: DC | PRN
  Administered 2020-03-13 (×2): 1 via ORAL
  Administered 2020-03-13 – 2020-03-14 (×3): 2 via ORAL
  Filled 2020-03-13 (×4): qty 2
  Filled 2020-03-13: qty 1

## 2020-03-13 MED ORDER — PROMETHAZINE HCL 25 MG PO TABS: 12.5000 mg | ORAL_TABLET | Freq: Four times a day (QID) | ORAL | Status: DC | PRN

## 2020-03-13 MED ORDER — POVIDONE-IODINE 10 % EX SOLN
CUTANEOUS | Status: AC
  Filled 2020-03-13: qty 30

## 2020-03-13 MED ORDER — KCL IN DEXTROSE-NACL 20-5-0.45 MEQ/L-%-% IV SOLN
INTRAVENOUS | Status: DC
  Filled 2020-03-13 (×2): qty 1000

## 2020-03-13 MED ORDER — CEFAZOLIN SODIUM-DEXTROSE 1-4 GM/50ML-% IV SOLN
INTRAVENOUS | Status: AC
  Filled 2020-03-13: qty 50

## 2020-03-13 MED ORDER — OXYMETAZOLINE HCL 0.05 % NA SOLN
2.0000 | NASAL | Status: DC
  Filled 2020-03-13 (×2): qty 30
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
## 2020-03-13 NOTE — Progress Notes (Signed)
   03/13/20 1200  Clinical Encounter Type  Visited With Patient  Visit Type Initial  Referral From Nurse  Consult/Referral To Chaplain  Spiritual Encounters  Spiritual Needs Literature   Chaplain responded to consult for AD. AD paperwork is filled out and waiting on the notary and volunteers. Notary is at lunch and volunteers are changing shifts. AD paperwork is on the computer in pt's room. Chaplains remain available for assistance as needs arise.   Chaplain Resident, Amado Coe, M Div [phone] on-call pager
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
## 2020-03-13 NOTE — Progress Notes (Signed)
This is a follow up to a referral to get AD notarized.  Chaplain and notary went to unit and bedside.  Paperwork ready but there are no available volunteers.  Spoke with Diplomatic Services operational officer she said there were only two volunteers today and they are backlog and not available.  Will try to get done tomorrow. Volunteer services will call Chaplain office when volunteers are available.   Venida Jarvis, [place], [facility], Pager [phone]
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
## 2020-03-13 NOTE — ED Notes (Signed)
Patient transported to CT 

## 2020-03-14 ENCOUNTER — Encounter (HOSPITAL_COMMUNITY)
Admission: EM | Disposition: A | Discharge status: Home / Self Care | Payer: Self-pay | Source: Home / Self Care | Attending: Otolaryngology

## 2020-03-14 ENCOUNTER — Inpatient Hospital Stay (HOSPITAL_COMMUNITY): Payer: Medicare Other | Admitting: Anesthesiology

## 2020-03-14 ENCOUNTER — Encounter (HOSPITAL_COMMUNITY): Payer: Self-pay | Admitting: Otolaryngology

## 2020-03-14 HISTORY — PX: ORIF MANDIBULAR FRACTURE: SHX2127

## 2020-03-14 LAB — CBC
HCT: 39.5 % (ref 39.0–52.0)
Hemoglobin: 13.2 g/dL (ref 13.0–17.0)
MCH: 31.7 pg (ref 26.0–34.0)
MCHC: 33.4 g/dL (ref 30.0–36.0)
MCV: 94.7 fL (ref 80.0–100.0)
Platelets: 167 10*3/uL (ref 150–400)
RBC: 4.17 MIL/uL — ABNORMAL LOW (ref 4.22–5.81)
RDW: 13.2 % (ref 11.5–15.5)
WBC: 7.6 10*3/uL (ref 4.0–10.5)
nRBC: 0 % (ref 0.0–0.2)

## 2020-03-14 SURGERY — OPEN REDUCTION INTERNAL FIXATION (ORIF) MANDIBULAR FRACTURE
Anesthesia: General | Site: Mouth

## 2020-03-14 MED ORDER — DEXAMETHASONE SODIUM PHOSPHATE 10 MG/ML IJ SOLN
INTRAMUSCULAR | Status: AC
  Filled 2020-03-14: qty 1

## 2020-03-14 MED ORDER — DEXAMETHASONE SODIUM PHOSPHATE 10 MG/ML IJ SOLN
INTRAMUSCULAR | Status: DC | PRN
  Administered 2020-03-14: 10 mg via INTRAVENOUS

## 2020-03-14 MED ORDER — SUGAMMADEX SODIUM 200 MG/2ML IV SOLN
INTRAVENOUS | Status: DC | PRN
  Administered 2020-03-14: 300 mg via INTRAVENOUS

## 2020-03-14 MED ORDER — PROMETHAZINE HCL 25 MG PO TABS: 25.0000 mg | ORAL_TABLET | Freq: Four times a day (QID) | ORAL | Status: DC | PRN

## 2020-03-14 MED ORDER — PROPOFOL 10 MG/ML IV BOLUS
INTRAVENOUS | Status: AC
  Filled 2020-03-14: qty 20

## 2020-03-14 MED ORDER — ONDANSETRON HCL 4 MG/2ML IJ SOLN
INTRAMUSCULAR | Status: AC
  Filled 2020-03-14: qty 2

## 2020-03-14 MED ORDER — PHENYLEPHRINE HCL-NACL 10-0.9 MG/250ML-% IV SOLN
INTRAVENOUS | Status: DC | PRN
Start: 2020-03-14 — End: 2020-03-14
  Administered 2020-03-14: 30 ug/min via INTRAVENOUS

## 2020-03-14 MED ORDER — HYDROCODONE-ACETAMINOPHEN 7.5-325 MG/15ML PO SOLN
10.0000 mL | ORAL | Status: DC | PRN
  Administered 2020-03-14 – 2020-03-15 (×3): 15 mL via ORAL
  Filled 2020-03-14 (×3): qty 15

## 2020-03-14 MED ORDER — CLINDAMYCIN PALMITATE HCL 75 MG/5ML PO SOLR
300.0000 mg | Freq: Three times a day (TID) | ORAL | Status: DC
  Administered 2020-03-14 – 2020-03-15 (×3): 300 mg via ORAL
  Filled 2020-03-14 (×5): qty 20

## 2020-03-14 MED ORDER — LIDOCAINE 2% (20 MG/ML) 5 ML SYRINGE
INTRAMUSCULAR | Status: DC | PRN
  Administered 2020-03-14: 60 mg via INTRAVENOUS
  Administered 2020-03-14: 40 mg via INTRAVENOUS

## 2020-03-14 MED ORDER — LIDOCAINE-EPINEPHRINE 1 %-1:100000 IJ SOLN
INTRAMUSCULAR | Status: AC
  Filled 2020-03-14: qty 1

## 2020-03-14 MED ORDER — FENTANYL CITRATE (PF) 250 MCG/5ML IJ SOLN
INTRAMUSCULAR | Status: AC
  Filled 2020-03-14: qty 5

## 2020-03-14 MED ORDER — LACTATED RINGERS IV SOLN: INTRAVENOUS | Status: DC

## 2020-03-14 MED ORDER — BACITRACIN ZINC 500 UNIT/GM EX OINT
TOPICAL_OINTMENT | CUTANEOUS | Status: DC | PRN
  Administered 2020-03-14: 1 via TOPICAL

## 2020-03-14 MED ORDER — ONDANSETRON HCL 4 MG/2ML IJ SOLN
INTRAMUSCULAR | Status: DC | PRN
  Administered 2020-03-14: 4 mg via INTRAVENOUS

## 2020-03-14 MED ORDER — ROCURONIUM BROMIDE 10 MG/ML (PF) SYRINGE
PREFILLED_SYRINGE | INTRAVENOUS | Status: DC | PRN
  Administered 2020-03-14: 40 mg via INTRAVENOUS
  Administered 2020-03-14: 60 mg via INTRAVENOUS

## 2020-03-14 MED ORDER — FENTANYL CITRATE (PF) 250 MCG/5ML IJ SOLN
INTRAMUSCULAR | Status: DC | PRN
  Administered 2020-03-14 (×2): 50 ug via INTRAVENOUS
  Administered 2020-03-14: 100 ug via INTRAVENOUS
  Administered 2020-03-14 (×2): 50 ug via INTRAVENOUS

## 2020-03-14 MED ORDER — DEXTROSE-NACL 5-0.9 % IV SOLN: INTRAVENOUS | Status: DC

## 2020-03-14 MED ORDER — ROCURONIUM BROMIDE 10 MG/ML (PF) SYRINGE
PREFILLED_SYRINGE | INTRAVENOUS | Status: AC
  Filled 2020-03-14: qty 10

## 2020-03-14 MED ORDER — NITROGLYCERIN 0.4 MG SL SUBL: 0.4000 mg | SUBLINGUAL_TABLET | SUBLINGUAL | Status: DC | PRN

## 2020-03-14 MED ORDER — CHLORHEXIDINE GLUCONATE 0.12 % MT SOLN
OROMUCOSAL | Status: AC
  Administered 2020-03-14: 15 mL via OROMUCOSAL
  Filled 2020-03-14: qty 15

## 2020-03-14 MED ORDER — IBUPROFEN 100 MG/5ML PO SUSP
400.0000 mg | Freq: Four times a day (QID) | ORAL | Status: DC | PRN
  Administered 2020-03-14: 400 mg via ORAL
  Filled 2020-03-14 (×2): qty 20

## 2020-03-14 MED ORDER — HYDROMORPHONE HCL 1 MG/ML IJ SOLN: 0.2500 mg | INTRAMUSCULAR | Status: DC | PRN

## 2020-03-14 MED ORDER — VITAMIN D 25 MCG (1000 UNIT) PO TABS
1000.0000 [IU] | ORAL_TABLET | Freq: Every day | ORAL | Status: DC
  Administered 2020-03-14 – 2020-03-15 (×2): 1000 [IU] via ORAL
  Filled 2020-03-14 (×2): qty 1

## 2020-03-14 MED ORDER — CHLORHEXIDINE GLUCONATE 0.12 % MT SOLN: 15.0000 mL | Freq: Once | OROMUCOSAL | Status: AC

## 2020-03-14 MED ORDER — LACTATED RINGERS IV SOLN: INTRAVENOUS | Status: DC | PRN

## 2020-03-14 MED ORDER — ASPIRIN 81 MG PO CHEW
81.0000 mg | CHEWABLE_TABLET | Freq: Every day | ORAL | Status: DC
  Administered 2020-03-14 – 2020-03-15 (×2): 81 mg via ORAL
  Filled 2020-03-14 (×2): qty 1

## 2020-03-14 MED ORDER — OXYMETAZOLINE HCL 0.05 % NA SOLN
NASAL | Status: AC
  Filled 2020-03-14: qty 30

## 2020-03-14 MED ORDER — BACITRACIN ZINC 500 UNIT/GM EX OINT
1.0000 "application " | TOPICAL_OINTMENT | Freq: Three times a day (TID) | CUTANEOUS | Status: DC
  Administered 2020-03-14 – 2020-03-15 (×3): 1 via TOPICAL
  Filled 2020-03-14: qty 28.4

## 2020-03-14 MED ORDER — PHENYLEPHRINE 40 MCG/ML (10ML) SYRINGE FOR IV PUSH (FOR BLOOD PRESSURE SUPPORT)
PREFILLED_SYRINGE | INTRAVENOUS | Status: DC | PRN
  Administered 2020-03-14: 80 ug via INTRAVENOUS

## 2020-03-14 MED ORDER — PROMETHAZINE HCL 25 MG RE SUPP
25.0000 mg | Freq: Four times a day (QID) | RECTAL | Status: DC | PRN
  Filled 2020-03-14: qty 1

## 2020-03-14 MED ORDER — PROPOFOL 10 MG/ML IV BOLUS
INTRAVENOUS | Status: DC | PRN
  Administered 2020-03-14: 40 mg via INTRAVENOUS
  Administered 2020-03-14: 150 mg via INTRAVENOUS

## 2020-03-14 MED ORDER — BACITRACIN ZINC 500 UNIT/GM EX OINT
TOPICAL_OINTMENT | CUTANEOUS | Status: AC
  Filled 2020-03-14: qty 28.35

## 2020-03-14 MED ORDER — 0.9 % SODIUM CHLORIDE (POUR BTL) OPTIME
TOPICAL | Status: DC | PRN
  Administered 2020-03-14: 1000 mL

## 2020-03-14 MED ORDER — CEFAZOLIN SODIUM-DEXTROSE 2-3 GM-%(50ML) IV SOLR
INTRAVENOUS | Status: DC | PRN
  Administered 2020-03-14: 2 g via INTRAVENOUS

## 2020-03-14 SURGICAL SUPPLY — 42 items
BAR FIX PREFORMED OMNIMAX (Miscellaneous) ×6 IMPLANT
BLADE SURG 15 STRL LF DISP TIS (BLADE) IMPLANT
BLADE SURG 15 STRL SS (BLADE)
CANISTER SUCT 3000ML PPV (MISCELLANEOUS) ×3 IMPLANT
CLEANER TIP ELECTROSURG 2X2 (MISCELLANEOUS) ×3 IMPLANT
COVER SURGICAL LIGHT HANDLE (MISCELLANEOUS) ×3 IMPLANT
COVER WAND RF STERILE (DRAPES) IMPLANT
DRAPE HALF SHEET 40X57 (DRAPES) IMPLANT
DRILL TW 1.1X50 5MM STOP W/NT (MISCELLANEOUS) ×1 IMPLANT
ELECT COATED BLADE 2.86 ST (ELECTRODE) ×3 IMPLANT
ELECT NEEDLE TIP 2.8 STRL (NEEDLE) ×3 IMPLANT
ELECT REM PT RETURN 9FT ADLT (ELECTROSURGICAL) ×3
ELECTRODE REM PT RTRN 9FT ADLT (ELECTROSURGICAL) ×1 IMPLANT
GLOVE ECLIPSE 7.5 STRL STRAW (GLOVE) ×3 IMPLANT
GOWN STRL REUS W/ TWL LRG LVL3 (GOWN DISPOSABLE) ×2 IMPLANT
GOWN STRL REUS W/TWL LRG LVL3 (GOWN DISPOSABLE) ×6
KIT BASIN OR (CUSTOM PROCEDURE TRAY) ×3 IMPLANT
KIT TURNOVER KIT B (KITS) ×3 IMPLANT
NEEDLE PRECISIONGLIDE 27X1.5 (NEEDLE) ×3 IMPLANT
NS IRRIG 1000ML POUR BTL (IV SOLUTION) ×3 IMPLANT
PAD ARMBOARD 7.5X6 YLW CONV (MISCELLANEOUS) ×6 IMPLANT
PENCIL FOOT CONTROL (ELECTRODE) ×3 IMPLANT
PLATE 1.5/0.6 4X6 LEFT LONG L (Plate) ×3 IMPLANT
PLATE RGD 1.5/0.6 4 HOLE (Plate) ×3 IMPLANT
PLATE RGD 1.5/0.6 4X6 LT LNG (Plate) ×3 IMPLANT
SCISSORS WIRE ANG 4 3/4 DISP (INSTRUMENTS) ×3 IMPLANT
SCREW BONE MANDIB SD 2X9 (Screw) ×18 IMPLANT
SCREW NON LOCK HT X-DR 1.5X4 (Screw) ×9 IMPLANT
SCREW SELF DRILL HT 1.5/4MM (Screw) ×21 IMPLANT
SUT CHROMIC 3 0 PS 2 (SUTURE) ×3 IMPLANT
SUT STEEL 0 (SUTURE)
SUT STEEL 0 18XMFL TIE 17 (SUTURE) IMPLANT
SUT STEEL 2 (SUTURE) IMPLANT
SUT STEEL 4 (SUTURE) IMPLANT
SUT VIC AB 3-0 SH 27 (SUTURE) ×3
SUT VIC AB 3-0 SH 27XBRD (SUTURE) ×1 IMPLANT
SUT VICRYL 4-0 PS2 18IN ABS (SUTURE) ×3 IMPLANT
TOWEL GREEN STERILE FF (TOWEL DISPOSABLE) ×3 IMPLANT
TRAY ENT MC OR (CUSTOM PROCEDURE TRAY) ×3 IMPLANT
TW DRILL1.1X50 5MM STOP W/NT (MISCELLANEOUS) ×3
WATER STERILE IRR 1000ML POUR (IV SOLUTION) ×3 IMPLANT
WIRE 24 GAUGE OMINIMAX MMF (WIRE) ×3 IMPLANT

## 2020-03-14 NOTE — Progress Notes (Signed)
The chaplain visited to assist with the completion of the patients AD. The patient was able to get the AD paperwork signed and notarized. The chaplain does not assess a need for follow-up.  Lavone Neri Chaplain Resident For questions concerning this note please contact me by pager [phone]

## 2020-03-14 NOTE — Progress Notes (Signed)
Afrin not given per Dr. Sampson Goon. Pt will not be intubated through the nose.

## 2020-03-14 NOTE — Transfer of Care (Signed)
Immediate Anesthesia Transfer of Care Note  Patient: [name] [name].  Procedure(s) Performed: OPEN REDUCTION INTERNAL FIXATION (ORIF) MID  FACE FRACTURE, MANDIBULAR FIXATION MODIFIED ARCH BARS (N/A )  Patient Location: PACU  Anesthesia Type:General  Level of Consciousness: awake and alert   Airway & Oxygen Therapy: Patient Spontanous Breathing and Patient connected to nasal cannula oxygen  Post-op Assessment: Report given to RN, Post -op Vital signs reviewed and stable and Patient moving all extremities X 4  Post vital signs: Reviewed and stable  Last Vitals:  Vitals Value Taken Time  BP 171/74 03/14/20 1223  Temp    Pulse 44 03/14/20 1225  Resp 20 03/14/20 1225  SpO2 97 % 03/14/20 1225  Vitals shown include unvalidated device data.  Last Pain:  Vitals:   03/14/20 0827  TempSrc: Oral  PainSc:       Patients Stated Pain Goal: 3 (03/13/20 1814)  Complications: No complications documented.

## 2020-03-14 NOTE — Anesthesia Postprocedure Evaluation (Signed)
Anesthesia Post Note  Patient: [name] [name].  Procedure(s) Performed: OPEN REDUCTION INTERNAL FIXATION (ORIF) MID  FACE FRACTURE, MANDIBULAR FIXATION MODIFIED ARCH BARS (N/A Mouth)     Patient location during evaluation: PACU Anesthesia Type: General Level of consciousness: awake and alert Pain management: pain level controlled Vital Signs Assessment: post-procedure vital signs reviewed and stable Respiratory status: spontaneous breathing, nonlabored ventilation and respiratory function stable Cardiovascular status: blood pressure returned to baseline and stable Postop Assessment: no apparent nausea or vomiting Anesthetic complications: no   No complications documented.  Last Vitals:  Vitals:   03/14/20 1236 03/14/20 1310  BP: (!) 170/97 (!) 141/72  Pulse: (!) 42 88  Resp: 10 17  Temp: 36.7 C (!) 36.4 C  SpO2: 98% 93%    Last Pain:  Vitals:   03/14/20 1310  TempSrc: Oral  PainSc:                  [name],W. EDMOND

## 2020-03-14 NOTE — Anesthesia Preprocedure Evaluation (Addendum)
Anesthesia Evaluation  Patient identified by MRN, date of birth, ID band Patient awake    Reviewed: Allergy & Precautions, H&P , NPO status , Patient's Chart, lab work & pertinent test results  Airway Mallampati: IV  TM Distance: >3 FB Neck ROM: Full  Mouth opening: Limited Mouth Opening  Dental no notable dental hx. (+) Poor Dentition, Dental Advisory Given   Pulmonary neg pulmonary ROS, former smoker,    Pulmonary exam normal breath sounds clear to auscultation       Cardiovascular + CAD, + Past MI and + Cardiac Stents   Rhythm:Regular Rate:Normal     Neuro/Psych negative neurological ROS  negative psych ROS   GI/Hepatic Neg liver ROS, hiatal hernia,   Endo/Other  negative endocrine ROS  Renal/GU negative Renal ROS  negative genitourinary   Musculoskeletal   Abdominal   Peds  Hematology negative hematology ROS (+)   Anesthesia Other Findings   Reproductive/Obstetrics negative OB ROS                            Anesthesia Physical Anesthesia Plan  ASA: III  Anesthesia Plan: General   Post-op Pain Management:    Induction: Intravenous  PONV Risk Score and Plan: 3 and Ondansetron, Dexamethasone and Treatment may vary due to age or medical condition  Airway Management Planned: Oral ETT and Video Laryngoscope Planned  Additional Equipment:   Intra-op Plan:   Post-operative Plan: Extubation in OR  Informed Consent: I have reviewed the patients History and Physical, chart, labs and discussed the procedure including the risks, benefits and alternatives for the proposed anesthesia with the patient or authorized representative who has indicated his/her understanding and acceptance.     Dental advisory given  Plan Discussed with: CRNA  Anesthesia Plan Comments:        Anesthesia Quick Evaluation

## 2020-03-14 NOTE — Progress Notes (Signed)
Patient ID: [name] Blood., male   DOB: 1945/12/14, 74 y.o.   MRN: [id number] Postop check, doing very well, complains of pain but not severe.  He is drinking well with a syringe.  MMF in place and stable.  Diffuse ecchymosis and swelling around the face, no significant change.  Breathing and voice are clear.  Continue care, anticipate discharge in the morning.

## 2020-03-14 NOTE — Op Note (Signed)
OPERATIVE REPORT  DATE OF SURGERY: 03/14/2020  PATIENT:  [name] Blood.,  74 y.o. male  PRE-OPERATIVE DIAGNOSIS:  Facial Fractures  POST-OPERATIVE DIAGNOSIS:  Facial Fractures  PROCEDURE:  Procedure(s): OPEN REDUCTION INTERNAL FIXATION (ORIF) MID  FACE FRACTURE, MANDIBULAR FIXATION MODIFIED ARCH BARS  SURGEON:  Susy Frizzle, MD  ASSISTANTS: none  ANESTHESIA:   General   EBL:  150 ml  DRAINS: none  LOCAL MEDICATIONS USED:  None  SPECIMEN:  none  COUNTS:  Correct  PROCEDURE DETAILS: The patient was taken to the operating room and placed on the operating table in the supine position. Following induction of general endotracheal anesthesia, the face was prepped and draped in a standard fashion.  The patient was orally intubated.  He had absence of the mandibular medial incisors on both sides and the left maxillary medial incisor.  The right maxillary medial incisor was severely loosened by the injury and for safety sake was removed very easily.  He had a large enough gap that we were able to keep the oral tube in throughout the case.  Biomet Omni max MMF arch bars were cut to size and placed in the maxilla and the mandible secured in place with 3 screws on each.  MMF was then established using 2 wires one on each lateral aspect.  The facial degloving incision was then accomplished using electrocautery and the maxilla was exposed on both sides.  The fracture on the right side was easily identified.  A fracture on the left was also easily identified and was felt to be unstable.  The right side was minimally displaced and relatively stable.  Biomet 1.5 midface 4 x 6 long  0.6 mm plate was used on the left, 0.5 mm on the right.  Each was secured with 2 screws on each side of the fracture.  Combination of self drilling and self-tapping screws were used.  The infraorbital nerve was intact on the right.  I could not say for sure on the left.  There is significant soft tissue damage in  that area.  An additional fracture along the left lateral buttress was fixated using a 4-hole straight 0.6 mm plate and appropriate for millimeter screws, 2 on each side of the fracture.  Midface was felt to be stable at this point.  The wounds were irrigated with saline.  The incision was reapproximated using a running 3-0 Vicryl suture.  Patient was awakened extubated and transferred to recovery in stable condition.  PATIENT DISPOSITION:  To PACU, stable

## 2020-03-14 NOTE — Interval H&P Note (Signed)
History and Physical Interval Note:  03/14/2020 10:30 AM  [name] [name].  has presented today for surgery, with the diagnosis of facial fractureS.  The various methods of treatment have been discussed with the patient and family. After consideration of risks, benefits and other options for treatment, the patient has consented to  Procedure(s): OPEN REDUCTION INTERNAL FIXATION (ORIF) MID  FACE FRACTURE, MANDIBULAR FIXATION MODIFIED ARCH BARS (N/A) as a surgical intervention.  The patient's history has been reviewed, patient examined, no change in status, stable for surgery.  I have reviewed the patient's chart and labs.  Questions were answered to the patient's satisfaction.     Serena Colonel

## 2020-03-14 NOTE — Progress Notes (Signed)
Chaplain attempted to notarize  AD but unable to accomplish because no volunteers on duty.  Referring to day chaplains.  Vernell Morgans Chaplain Resident

## 2020-03-14 NOTE — TOC CAGE-AID Note (Signed)
Transition of Care [facility]) - CAGE-AID Screening   Patient Details  Name: [name] [name]. MRN: [id number] Date of Birth: 1946/07/16  Transition of Care [facility]) CM/SW Contact:    Emeterio Reeve, [place] Phone Number: 03/14/2020, 5:07 PM   Clinical Narrative:  CSW met with pt at bedside. CSW introduced self and explained her role at the hospital. Pt reports he has 2 beers a day. Pts stated his PCP told him 2 beers a day was fine and he has no interest in decreasing or stopping. Pt refused resources and educational materials. Pt denied substance use.    CAGE-AID Screening:    Have You Ever Felt You Ought to Cut Down on Your Drinking or Drug Use?: No Have People Annoyed You By Critizing Your Drinking Or Drug Use?: No Have You Felt Bad Or Guilty About Your Drinking Or Drug Use?: No Have You Ever Had a Drink or Used Drugs First Thing In The Morning to STeady Your Nerves or to Get Rid of a Hangover?: No CAGE-AID Score: 0  Substance Abuse Education Offered: Yes      Blima Ledger, [place] Social Worker [phone]

## 2020-03-14 NOTE — Anesthesia Procedure Notes (Signed)
Procedure Name: Intubation Date/Time: 03/14/2020 11:01 AM Performed by: Nils Pyle, CRNA Pre-anesthesia Checklist: Patient identified, Emergency Drugs available, Suction available and Patient being monitored Patient Re-evaluated:Patient Re-evaluated prior to induction Oxygen Delivery Method: Circle System Utilized Preoxygenation: Pre-oxygenation with 100% oxygen Induction Type: IV induction Ventilation: Mask ventilation without difficulty and Oral airway inserted - appropriate to patient size Laryngoscope Size: Glidescope and 4 Grade View: Grade I Tube type: Oral Rae Tube size: 7.5 mm Number of attempts: 1 Airway Equipment and Method: Stylet and Oral airway Placement Confirmation: ETT inserted through vocal cords under direct vision,  positive ETCO2 and breath sounds checked- equal and bilateral Tube secured with: Tape Dental Injury: Teeth and Oropharynx as per pre-operative assessment

## 2020-03-15 ENCOUNTER — Encounter (HOSPITAL_COMMUNITY): Payer: Self-pay | Admitting: Otolaryngology

## 2020-03-15 MED ORDER — PROMETHAZINE HCL 25 MG RE SUPP
25.0000 mg | Freq: Four times a day (QID) | RECTAL | 1 refills | Status: DC | PRN
Start: 2020-03-15 — End: 2020-03-27

## 2020-03-15 MED ORDER — CLINDAMYCIN HCL 300 MG PO CAPS
300.0000 mg | ORAL_CAPSULE | Freq: Three times a day (TID) | ORAL | 0 refills | Status: DC
Start: 2020-03-15 — End: 2020-03-26

## 2020-03-15 MED ORDER — HYDROCODONE-ACETAMINOPHEN 7.5-325 MG PO TABS: 1.0000 | ORAL_TABLET | Freq: Four times a day (QID) | ORAL | 0 refills | Status: DC | PRN

## 2020-03-15 NOTE — Discharge Instructions (Signed)
Jaw Fracture Eating Plan A break (fracture) of the jaw bone often needs surgery for treatment. After surgery, you will need to eat foods that can be blended so that they can be sipped from a straw or given through a syringe. Work with a diet and nutrition specialist (dietitian) to create an eating plan that helps you get the nutrients you need in order to heal and stay healthy. What are tips for following this plan? General guidelines  All foods in this plan must be blended. Avoid nuts, seeds, skins, peels, bones, or any foods that cannot be blended to the right consistency.  Ask your health care provider about taking a liquid multivitamin to make sure that you get all the vitamins and minerals you need. Cooking   Before blending, remove any skins, seeds, or peels from food.  Cook meats and vegetables until tender.  Cut foods into small pieces and mix with a small amount of liquid in a food processor or blender. Continue to add liquid until the food becomes thin enough to sip through a straw.  Add liquids such as juice, milk, cream, broth, gravy, or vegetable juice to help add flavor to foods.  Heat foods after they have been blended, not before. This reduces the amount of foam created from blending.  If you need to increase calories in food: ? Add protein powder or powdered milk to foods. ? Cook with fats, such as margarine (without trans fat), sour cream, cream cheese, cream, or nut butters. ? Prepare foods with sweeteners, such as honey, ice cream, blackstrap molasses, or sugar. Meal planning  Eat at least three meals and three snacks daily. It is important to make sure that you get enough calories and protein to prevent weight loss and help your body heal, especially after surgery.  Eat a variety of foods from each food group every day, including fruits and vegetables, protein, whole grains, dairy, and healthy fats.  If your teeth and mouth are sensitive to extreme  temperatures, heat or cool your foods to lukewarm temperatures. What foods are recommended? The items listed may not be a complete list. Talk with your dietitian about what dietary choices are best for you. Grains Hot cereals, such as oatmeal, grits, ground wheat cereals, and polenta. Rice and pasta. Couscous. Vegetables All cooked or canned vegetables, without seeds and skins. Vegetable juices. Cooked potatoes, without skins. Fruits Any cooked or canned fruits, without seeds and skins. Fresh, peeled soft fruits, such as bananas and peaches, that can be blended until smooth. All fruit juices, without seeds and skins. Meat and other protein foods Soft-boiled eggs, scrambled eggs, powdered eggs, pasteurized egg mixtures, and custard. Ground meats, such as hamburger, [place], sausage, and meatloaf. Tender, well-cooked meat, poultry, and fish, prepared without bones or skin. Soft soy foods, such as tofu. Smooth nut butters. Liquid egg substitutes. Dairy Milk. Cheese. Yogurt. Cottage cheese. Pudding. Beverages Coffee (regular or decaffeinated), tea, and mineral water. Liquid supplements that have protein and calories. Fats and oils Any oils. Melted margarine or butter. Ghee. Sour cream. Cream cheese. Avocado. Seasoning and other foods All seasonings and condiments that blend well. Ground spices. Finely ground seeds and nuts. Mustard or any smooth condiment. Summary  Foods in this plan need to be prepared so that they can be sipped from a straw or given through a syringe. Try to have at least three meals and three snacks daily.  Avoid nuts, seeds, skins, peels, bones, or any foods that cannot be blended  to the right consistency. Make sure you eat a variety of foods from each food group every day.  Include a liquid multivitamin in your plan as told by your health care provider or dietitian. This information is not intended to replace advice given to you by your health care provider. Make sure you  discuss any questions you have with your health care provider. Document Revised: 12/24/2018 Document Reviewed: 12/09/2016 Elsevier Patient Education  2020 Elsevier Inc.   Laceration Care, Adult A laceration is a cut that may go through all layers of the skin. The cut may also go into the tissue that is right under the skin. Some cuts heal on their own. Others need to be closed with stitches (sutures), staples, skin adhesive strips, or skin glue. Taking care of your injury lowers your risk of infection, helps your injury to heal better, and may prevent scarring. Supplies needed:  Soap.  Water.  Hand sanitizer.  Bandage (dressing).  Antibiotic ointment.  Clean towel. How to take care of your cut Wash your hands with soap and water before touching your wound or changing your bandage. If soap and water are not available, use hand sanitizer. If your doctor used stitches or staples:  Keep the wound clean and dry.  If you were given a bandage, change it at least once a day as told by your doctor. You should also change it if it gets wet or dirty.  Keep the wound completely dry for the first 24 hours, or as told by your doctor. After that, you may take a shower or a bath. Do not get the wound soaked in water until after the stitches or staples have been removed.  Clean the wound once a day, or as told by your doctor: ? Wash the wound with soap and water. ? Rinse the wound with water to remove all soap. ? Pat the wound dry with a clean towel. Do not rub the wound.  After you clean the wound, put a thin layer of antibiotic ointment on it as told by your doctor. This ointment: ? Helps to prevent infection. ? Keeps the bandage from sticking to the wound.  Have your stitches or staples removed as told by your doctor. If your doctor used skin adhesive strips:  Keep the wound clean and dry.  If you were given a bandage, you should change it at least once a day as told by your doctor.  You should also change it if it gets wet or dirty.  Do not get the skin adhesive strips wet. You can take a shower or a bath, but keep the wound dry.  If the wound gets wet, pat it dry with a clean towel. Do not rub the wound.  Skin adhesive strips fall off on their own. You can trim the strips as the wound heals. Do not remove any strips that are still stuck to the wound. They will fall off after a while. If your doctor used skin glue:  Try to keep your wound dry, but you may briefly wet it in the shower or bath. Do not soak the wound in water, such as by swimming.  After you take a shower or a bath, gently pat the wound dry with a clean towel. Do not rub the wound.  Do not do any activities that will make you really sweaty until the skin glue has fallen off on its own.  Do not apply liquid, cream, or ointment medicine to your wound while  the skin glue is still on.  If you were given a bandage, you should change it at least once a day or as told by your doctor. You should also change it if it gets dirty or wet.  If a bandage is placed over the wound, do not let the tape touch the skin glue.  Do not pick at the glue. The skin glue usually stays on for 5-10 days. Then, it falls off the skin. General instructions   Take over-the-counter and prescription medicines only as told by your doctor.  If you were given antibiotic medicine or ointment, take or apply it as told by your doctor. Do not stop using it even if your condition improves.  Do not scratch or pick at the wound.  Check your wound every day for signs of infection. Watch for: ? Redness, swelling, or pain. ? Fluid, blood, or pus.  Raise (elevate) the injured area above the level of your heart while you are sitting or lying down.  If directed, put ice on the affected area: ? Put ice in a plastic bag. ? Place a towel between your skin and the bag. ? Leave the ice on for 20 minutes, 2-3 times a day.  Prevent scarring by  covering your wound with sunscreen of at least 30 SPF whenever you are outside after your wound has healed.  Keep all follow-up visits as told by your doctor. This is important. Get help if:  You got a tetanus shot and you have any of these problems at the injection site: ? Swelling. ? Very bad pain. ? Redness. ? Bleeding.  You have a fever.  A wound that was closed breaks open.  You notice a bad smell coming from your wound or your bandage.  You notice something coming out of the wound, such as wood or glass.  Medicine does not relieve your pain.  You have more redness, swelling, or pain at the site of your wound.  You have fluid, blood, or pus coming from your wound.  You notice a change in the color of your skin near your wound.  You need to change the bandage often because fluid, blood, or pus is coming from the wound.  You start to have a new rash.  You start to have numbness around the wound. Get help right away if:  You have very bad swelling around the wound.  Your pain suddenly gets worse and is very bad.  You notice painful lumps near the wound or anywhere on your body.  You have a red streak going away from your wound.  The wound is on your hand or foot, and: ? You cannot move a finger or toe. ? Your fingers or toes look pale or bluish. Summary  A laceration is a cut that may go through all layers of the skin. The cut may also go into the tissue right under the skin.  Some cuts heal on their own. Others need to be closed with stitches, staples, skin adhesive strips, or skin glue.  Follow your doctor's instructions for caring for your cut. Proper care of a cut lowers the risk of infection, helps the cut heal better, and prevents scarring. This information is not intended to replace advice given to you by your health care provider. Make sure you discuss any questions you have with your health care provider. Document Revised: 10/30/2017 Document Reviewed:  09/21/2017 Elsevier Patient Education  2020 Elsevier Inc. Brush teeth twice daily.  Rinse mouth  with salt water after eating or drinking anything.  Make sure you consume adequate amounts of protein and calories.  There are 2 wires holding the jaws together, one on each side.  In the event of an emergency such as needing to vomit, cut the wires and remove them so that you may open your mouth.  If that happens, call us so that we can replace them if necessary.

## 2020-03-15 NOTE — Progress Notes (Signed)
AVS, education was provided. All questions answered. Upcomming appointments explained. Pt discharged home.

## 2020-03-15 NOTE — Discharge Summary (Signed)
Physician Discharge Summary  Patient ID: [name] [name]. MRN: [id number] DOB/AGE: July 21, 1946 74 y.o.  Admit date: 03/13/2020 Discharge date: 03/15/2020  Admission Diagnoses: Facial fractures  Discharge Diagnoses:  Active Problems:   Facial fracture [facility])   Discharged Condition: good  Hospital Course: No complications  Consults: none  Significant Diagnostic Studies: none  Treatments: surgery: Maxillomandibular fixation, open reduction internal fixation of bilateral [place] fractures  Discharge Exam: Blood pressure 125/90, pulse 79, temperature (!) 97.5 F (36.4 C), temperature source Oral, resp. rate 17, height 5' 5.98" (1.676 m), weight 72.2 kg, SpO2 96 %. PHYSICAL EXAM: He is awake and alert, drinking well.  Breathing without difficulty.  Disposition: Discharge disposition: 01-Home or Self Care       Discharge Instructions    Diet - low sodium heart healthy   Complete by: As directed    Diet full liquid   Complete by: As directed    Increase activity slowly   Complete by: As directed    No wound care   Complete by: As directed      Allergies as of 03/15/2020      Reactions   Lipitor [atorvastatin] Other (See Comments)   "Makes pt feel funky"      Medication List    TAKE these medications   aspirin 81 MG chewable tablet Chew 81 mg by mouth daily.   carvedilol 3.125 MG tablet Commonly known as: COREG Take 1 tablet (3.125 mg total) by mouth 2 (two) times daily with a meal.   cholecalciferol 25 MCG (1000 UNIT) tablet Commonly known as: VITAMIN D3 Take 1,000 Units by mouth daily.   clindamycin 300 MG capsule Commonly known as: Cleocin Take 1 capsule (300 mg total) by mouth 3 (three) times daily.   clopidogrel 75 MG tablet Commonly known as: PLAVIX Take 4 tablets by mouth on day 1, take 1 tablet by mouth daily thereafter   HYDROcodone-acetaminophen 7.5-325 MG tablet Commonly known as: Norco Take 1 tablet by mouth every 6 (six) hours as needed  for moderate pain.   hydrocortisone cream 1 % Apply 1 application topically daily as needed for itching.   nitroGLYCERIN 0.4 MG SL tablet Commonly known as: Nitrostat Place 1 tablet (0.4 mg total) under the tongue every 5 (five) minutes as needed for chest pain.   promethazine 25 MG suppository Commonly known as: PHENERGAN Place 1 suppository (25 mg total) rectally every 6 (six) hours as needed for nausea or vomiting.   rosuvastatin 20 MG tablet Commonly known as: CRESTOR Take 1 tablet (20 mg total) by mouth daily.   tadalafil 10 MG tablet Commonly known as: CIALIS Take 10 mg by mouth daily as needed.       Follow-up Information    Serena Colonel, MD. Schedule an appointment as soon as possible for a visit in 2 week(s).   Specialty: Otolaryngology Contact information: [address] [place] [address] [phone]               Signed: Serena Colonel 03/15/2020, 9:05 AM

## 2020-03-15 NOTE — Plan of Care (Signed)
Pt a/o x4, VS stable, ready to DC

## 2020-03-22 ENCOUNTER — Other Ambulatory Visit: Payer: Self-pay | Admitting: *Deleted

## 2020-03-22 DIAGNOSIS — S02401A Maxillary fracture, unspecified, initial encounter for closed fracture: Secondary | ICD-10-CM | POA: Insufficient documentation

## 2020-03-22 HISTORY — DX: Maxillary fracture, unspecified side, initial encounter for closed fracture: S02.401A

## 2020-03-22 NOTE — Patient Outreach (Signed)
[facility]) Care Management  03/22/2020  [name] [name]. Sep 16, 1945 [id number]  Telephone outreach for RED FLAG on EMMI CALL: Feeling sad, hopeless, anxious, empty and has other questions.  Called and spoke with [name]. He is having difficulty accessing his records and wants help to get on MyChart.  Needs new primary care MD.  Hx of difficulty with communications with providers. Needs an advocate to assist with this.   Has his jaw wired shut and and only able to take fluid via a syringe. He is getting in some ensure, water, coffee and tea.  He is very frustrated with trying to communicate with the ADA, Sherriff, MR and get everything he needs for the court date next Wed, 04/03/20.  Advised I will follow up with him on Monday to see if he has any new updates and if not I will call MR. I will also call [facility] to see if he can re-establish there. If not we will discuss his next choice for primary care.  [name]. [name], MSN, [facility] Gerontological Nurse Practitioner [facility] Care Management [phone]

## 2020-03-26 ENCOUNTER — Encounter: Payer: Self-pay | Admitting: *Deleted

## 2020-03-26 ENCOUNTER — Other Ambulatory Visit: Payer: Self-pay | Admitting: *Deleted

## 2020-03-26 ENCOUNTER — Telehealth: Payer: Self-pay | Admitting: Family Medicine

## 2020-03-26 NOTE — Telephone Encounter (Signed)
Appt scheduled for tomorrow. Left detailed message on patients voicemail that appt had been scheduled
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
## 2020-03-27 ENCOUNTER — Other Ambulatory Visit: Payer: Self-pay

## 2020-03-27 ENCOUNTER — Encounter: Payer: Self-pay | Admitting: Family Medicine

## 2020-03-27 ENCOUNTER — Ambulatory Visit (INDEPENDENT_AMBULATORY_CARE_PROVIDER_SITE_OTHER): Payer: Medicare HMO | Admitting: Family Medicine

## 2020-03-27 VITALS — BP 124/68 | HR 67 | Temp 97.6°F | Ht 65.98 in | Wt 146.8 lb

## 2020-03-27 DIAGNOSIS — S70362A Insect bite (nonvenomous), left thigh, initial encounter: Secondary | ICD-10-CM | POA: Diagnosis not present

## 2020-03-27 DIAGNOSIS — W57XXXA Bitten or stung by nonvenomous insect and other nonvenomous arthropods, initial encounter: Secondary | ICD-10-CM

## 2020-03-27 NOTE — Progress Notes (Signed)
° °  Assessment & Plan:  1. Tick bite of left thigh, initial encounter - Reassurance provided. No antibiotics needed. Education provided on tick bites.    Follow up plan: Return if symptoms worsen or fail to improve.  [name], MSN, APRN, FNP-C [facility]  Subjective:   Patient ID: [name] Erbes., male    DOB: [birth year], 74 y.o.   MRN: [id number]  HPI: [name] Hornstein. is a 74 y.o. male presenting on 03/27/2020 for Tick Removal (x 2 days ago- groin area)  Patient reports he removed a tick from his left upper thigh next to his groin 2 days ago.  Denies rash, nausea, vomiting, fever, body aches, fatigue and headaches.   ROS: Negative unless specifically indicated above in HPI.   Relevant past medical history reviewed and updated as indicated.   Allergies and medications reviewed and updated.   Current Outpatient Medications:    aspirin 81 MG chewable tablet, Chew 81 mg by mouth daily., Disp: , Rfl:    cholecalciferol (VITAMIN D3) 25 MCG (1000 UNIT) tablet, Take 1,000 Units by mouth daily. , Disp: , Rfl:    rosuvastatin (CRESTOR) 20 MG tablet, Take 1 tablet (20 mg total) by mouth daily., Disp: 90 tablet, Rfl: 3   tadalafil (CIALIS) 10 MG tablet, Take 10 mg by mouth daily as needed. , Disp: , Rfl:    nitroGLYCERIN (NITROSTAT) 0.4 MG SL tablet, Place 1 tablet (0.4 mg total) under the tongue every 5 (five) minutes as needed for chest pain., Disp: 25 tablet, Rfl: 2  Allergies  Allergen Reactions   Lipitor [Atorvastatin] Other (See Comments)    "Makes pt feel funky"    Objective:   BP 124/68    Pulse 67    Temp 97.6 F (36.4 C) (Temporal)    Ht 5' 5.98" (1.676 m)    Wt 146 lb 12.8 oz (66.6 kg)    SpO2 96%    BMI 23.71 kg/m    Physical Exam Vitals reviewed.  Constitutional:      General: He is not in acute distress.    Appearance: Normal appearance. He is not ill-appearing, toxic-appearing or diaphoretic.  HENT:     Head:  Normocephalic and atraumatic.  Eyes:     General: No scleral icterus.       Right eye: No discharge.        Left eye: No discharge.     Conjunctiva/sclera: Conjunctivae normal.  Cardiovascular:     Rate and Rhythm: Normal rate.  Pulmonary:     Effort: Pulmonary effort is normal. No respiratory distress.  Musculoskeletal:        General: Normal range of motion.     Cervical back: Normal range of motion.  Skin:    General: Skin is warm and dry.     Comments: Small scab to left upper thigh without surrounding erythema, warmth, or drainage.   Neurological:     Mental Status: He is alert and oriented to person, place, and time. Mental status is at baseline.  Psychiatric:        Mood and Affect: Mood normal.        Behavior: Behavior normal.        Thought Content: Thought content normal.        Judgment: Judgment normal.

## 2020-03-27 NOTE — Patient Instructions (Signed)

## 2020-03-29 ENCOUNTER — Encounter: Payer: Self-pay | Admitting: Family Medicine

## 2020-04-03 ENCOUNTER — Other Ambulatory Visit: Payer: Self-pay | Admitting: *Deleted

## 2020-04-03 ENCOUNTER — Encounter: Payer: Self-pay | Admitting: *Deleted

## 2020-04-03 NOTE — Patient Outreach (Signed)
Referral from Almetta Lovely, NP to Demetrios Loll,- [facility]. RN in reference to Child psychotherapist request.
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
## 2020-04-04 ENCOUNTER — Ambulatory Visit (INDEPENDENT_AMBULATORY_CARE_PROVIDER_SITE_OTHER): Payer: Medicare HMO | Admitting: Licensed Clinical Social Worker

## 2020-04-04 DIAGNOSIS — E782 Mixed hyperlipidemia: Secondary | ICD-10-CM | POA: Diagnosis not present

## 2020-04-04 DIAGNOSIS — I2510 Atherosclerotic heart disease of native coronary artery without angina pectoris: Secondary | ICD-10-CM | POA: Diagnosis not present

## 2020-04-04 DIAGNOSIS — E785 Hyperlipidemia, unspecified: Secondary | ICD-10-CM | POA: Diagnosis not present

## 2020-04-04 NOTE — Chronic Care Management (AMB) (Signed)
Chronic Care Management    Clinical Social Work Follow Up Note  04/04/2020 Name: [name] [name]. MRN: [id number] DOB: 04-Jun-1946  [name] [name]. is a 74 y.o. year old male who is a primary care patient of Gwenlyn Fudge, FNP. The CCM team was consulted for assistance with [name] [name] .   Review of patient status, including review of consultants reports, other relevant assessments, and collaboration with appropriate care team members and the patient's provider was performed as part of comprehensive patient evaluation and provision of chronic care management services.    SDOH (Social Determinants of Health) assessments performed: Yes; risk for stress; risk for depression; risk for tobacco use; risk for food insecurity  SDOH Interventions     Most Recent Value  SDOH Interventions  Depression Interventions/Treatment  --  [talked with client about RNCM support and LCSW support]        Chronic Care Management from 04/04/2020 in [facility]  PHQ-9 Total Score 5     GAD 7 : Generalized Anxiety Score 04/04/2020  Nervous, Anxious, on Edge 1  Control/stop worrying 0  Worry too much - different things 0  Trouble relaxing 0  Restless 0  Easily annoyed or irritable 0  Afraid - awful might happen 0  Total GAD 7 Score 1  Anxiety Difficulty Somewhat difficult    Outpatient Encounter Medications as of 04/04/2020  Medication Sig  . aspirin 81 MG chewable tablet Chew 81 mg by mouth daily.  . cholecalciferol (VITAMIN D3) 25 MCG (1000 UNIT) tablet Take 1,000 Units by mouth daily.   . nitroGLYCERIN (NITROSTAT) 0.4 MG SL tablet Place 1 tablet (0.4 mg total) under the tongue every 5 (five) minutes as needed for chest pain.  . rosuvastatin (CRESTOR) 20 MG tablet Take 1 tablet (20 mg total) by mouth daily.  . tadalafil (CIALIS) 10 MG tablet Take 10 mg by mouth daily as needed.    No facility-administered encounter medications on file as of 04/04/2020.     Goals  Addressed              This Visit's Progress   .  Client will talk with LCSW in next 30 days about health needs of client and client completion of ADLs (pt-stated)        CARE PLAN ENTRY   Current Barriers:  . Patient with chronic diagnoses of CAD, HLD, Facial fracture, Coronary  Atherosclerosis, [name] [name] fracture  Clinical Social Work Clinical Goal(s):  [name] LCSW will call client in next 4 weeks to talk with client about health needs of client and client completion of daily activities  Interventions:  Talked with client about CCM program support Talked with client about RNCM nursing support Talked with client about recent surgery of client Talked with client about eating/chewing challenges of client Talked with client about pain issues of client Talked with client about his upcoming medical appointments Talked with client about transport needs of client Talked with client about sleeping challenges of client Talked with client about recent court hearing Talked with [name] [name] about family stress issues with his son Talked with [name] [name] about support with cousin, neighbor Talked with [name] [name] about his completion of ADLs Talked with [name] [name] about relaxation techniques (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, sits on Scientist, physiological) Talked with [name] [name] about communication of client Provided counseling support for client  Patient Self Care Activities:   Completes ADLs independently  Patient Self Care Deficits:    Pain issues Eating issues due  to recent surgery  Initial goal documentation       Follow Up Plan: LCSW to call client in next 4 weeks to talk with client about health needs of client and to talk with him about client completion of daily activities  Kelton Pillar.Cornella Emmer MSW, LCSW Licensed Clinical Social Worker [facility] Care Management [phone]
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
## 2020-04-13 ENCOUNTER — Ambulatory Visit: Payer: Medicare HMO | Admitting: *Deleted

## 2020-04-13 ENCOUNTER — Telehealth: Payer: Self-pay | Admitting: *Deleted

## 2020-04-13 DIAGNOSIS — E785 Hyperlipidemia, unspecified: Secondary | ICD-10-CM

## 2020-04-13 DIAGNOSIS — I2510 Atherosclerotic heart disease of native coronary artery without angina pectoris: Secondary | ICD-10-CM | POA: Diagnosis not present

## 2020-04-13 DIAGNOSIS — E782 Mixed hyperlipidemia: Secondary | ICD-10-CM

## 2020-04-13 DIAGNOSIS — S02412D LeFort II fracture, subsequent encounter for fracture with routine healing: Secondary | ICD-10-CM
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
## 2020-04-13 NOTE — Telephone Encounter (Signed)
Patient aware and verbalizes understanding. 
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
## 2020-04-13 NOTE — Telephone Encounter (Signed)
The dental wax should work fine- but can also use anbesol OTC to numb area

## 2020-04-17 ENCOUNTER — Other Ambulatory Visit: Payer: Self-pay

## 2020-04-17 ENCOUNTER — Encounter (HOSPITAL_BASED_OUTPATIENT_CLINIC_OR_DEPARTMENT_OTHER): Payer: Self-pay | Admitting: Otolaryngology

## 2020-04-19 ENCOUNTER — Other Ambulatory Visit (HOSPITAL_COMMUNITY)
Admission: RE | Admit: 2020-04-19 | Discharge: 2020-04-19 | Disposition: A | Discharge status: Home / Self Care | Payer: Medicare HMO | Source: Ambulatory Visit | Attending: Otolaryngology | Admitting: Otolaryngology

## 2020-04-19 DIAGNOSIS — Z01812 Encounter for preprocedural laboratory examination: Secondary | ICD-10-CM | POA: Insufficient documentation

## 2020-04-19 DIAGNOSIS — Z20822 Contact with and (suspected) exposure to covid-19: Secondary | ICD-10-CM | POA: Diagnosis not present

## 2020-04-19 LAB — SARS CORONAVIRUS 2 (TAT 6-24 HRS): SARS Coronavirus 2: NEGATIVE

## 2020-04-20 NOTE — Progress Notes (Signed)
Reviewed patient with Dr Hyacinth Meeker. OK to proceed with case as planned.

## 2020-04-23 ENCOUNTER — Ambulatory Visit (HOSPITAL_BASED_OUTPATIENT_CLINIC_OR_DEPARTMENT_OTHER)
Admission: RE | Admit: 2020-04-23 | Discharge: 2020-04-23 | Disposition: A | Discharge status: Home / Self Care | Payer: Medicare HMO | Attending: Otolaryngology | Admitting: Otolaryngology

## 2020-04-23 ENCOUNTER — Encounter (HOSPITAL_BASED_OUTPATIENT_CLINIC_OR_DEPARTMENT_OTHER)
Admission: RE | Disposition: A | Discharge status: Home / Self Care | Payer: Self-pay | Source: Home / Self Care | Attending: Otolaryngology

## 2020-04-23 ENCOUNTER — Ambulatory Visit (HOSPITAL_BASED_OUTPATIENT_CLINIC_OR_DEPARTMENT_OTHER): Payer: Medicare HMO | Admitting: Anesthesiology

## 2020-04-23 ENCOUNTER — Encounter (HOSPITAL_BASED_OUTPATIENT_CLINIC_OR_DEPARTMENT_OTHER): Payer: Self-pay | Admitting: Otolaryngology

## 2020-04-23 DIAGNOSIS — I2510 Atherosclerotic heart disease of native coronary artery without angina pectoris: Secondary | ICD-10-CM | POA: Diagnosis not present

## 2020-04-23 DIAGNOSIS — S02609D Fracture of mandible, unspecified, subsequent encounter for fracture with routine healing: Secondary | ICD-10-CM | POA: Diagnosis not present

## 2020-04-23 DIAGNOSIS — L905 Scar conditions and fibrosis of skin: Secondary | ICD-10-CM | POA: Insufficient documentation

## 2020-04-23 DIAGNOSIS — Z8249 Family history of ischemic heart disease and other diseases of the circulatory system: Secondary | ICD-10-CM | POA: Insufficient documentation

## 2020-04-23 DIAGNOSIS — E785 Hyperlipidemia, unspecified: Secondary | ICD-10-CM | POA: Diagnosis not present

## 2020-04-23 DIAGNOSIS — Z472 Encounter for removal of internal fixation device: Secondary | ICD-10-CM | POA: Insufficient documentation

## 2020-04-23 DIAGNOSIS — Z87891 Personal history of nicotine dependence: Secondary | ICD-10-CM | POA: Insufficient documentation

## 2020-04-23 DIAGNOSIS — S0240FD Zygomatic fracture, left side, subsequent encounter for fracture with routine healing: Secondary | ICD-10-CM | POA: Diagnosis not present

## 2020-04-23 DIAGNOSIS — Z955 Presence of coronary angioplasty implant and graft: Secondary | ICD-10-CM | POA: Insufficient documentation

## 2020-04-23 DIAGNOSIS — I252 Old myocardial infarction: Secondary | ICD-10-CM | POA: Diagnosis not present

## 2020-04-23 HISTORY — PX: SCAR REVISION: SHX5285

## 2020-04-23 HISTORY — PX: MANDIBULAR HARDWARE REMOVAL: SHX5205

## 2020-04-23 SURGERY — REMOVAL, HARDWARE, MANDIBLE
Anesthesia: General | Site: Mouth

## 2020-04-23 MED ORDER — DEXAMETHASONE SODIUM PHOSPHATE 10 MG/ML IJ SOLN
INTRAMUSCULAR | Status: AC
  Filled 2020-04-23: qty 1

## 2020-04-23 MED ORDER — BACITRACIN ZINC 500 UNIT/GM EX OINT
TOPICAL_OINTMENT | CUTANEOUS | Status: AC
  Filled 2020-04-23: qty 0.9

## 2020-04-23 MED ORDER — OXYMETAZOLINE HCL 0.05 % NA SOLN
NASAL | Status: AC
  Filled 2020-04-23: qty 30

## 2020-04-23 MED ORDER — HYDROCODONE-ACETAMINOPHEN 7.5-325 MG PO TABS: 1.0000 | ORAL_TABLET | Freq: Four times a day (QID) | ORAL | 0 refills | Status: DC | PRN

## 2020-04-23 MED ORDER — LIDOCAINE-EPINEPHRINE 1 %-1:100000 IJ SOLN
INTRAMUSCULAR | Status: DC | PRN
  Administered 2020-04-23: 1 mL

## 2020-04-23 MED ORDER — EPHEDRINE SULFATE 50 MG/ML IJ SOLN
INTRAMUSCULAR | Status: DC | PRN
Start: 2020-04-23 — End: 2020-04-23
  Administered 2020-04-23: 10 mg via INTRAVENOUS

## 2020-04-23 MED ORDER — ONDANSETRON 8 MG PO TBDP: 8.0000 mg | ORAL_TABLET | Freq: Three times a day (TID) | ORAL | 1 refills | Status: DC | PRN

## 2020-04-23 MED ORDER — ONDANSETRON HCL 4 MG/2ML IJ SOLN
INTRAMUSCULAR | Status: DC | PRN
  Administered 2020-04-23: 4 mg via INTRAVENOUS

## 2020-04-23 MED ORDER — OXYCODONE HCL 5 MG PO TABS: 5.0000 mg | ORAL_TABLET | Freq: Once | ORAL | Status: DC | PRN

## 2020-04-23 MED ORDER — ONDANSETRON HCL 4 MG/2ML IJ SOLN
INTRAMUSCULAR | Status: AC
  Filled 2020-04-23: qty 2

## 2020-04-23 MED ORDER — DEXAMETHASONE SODIUM PHOSPHATE 4 MG/ML IJ SOLN
INTRAMUSCULAR | Status: DC | PRN
  Administered 2020-04-23: 5 mg via INTRAVENOUS

## 2020-04-23 MED ORDER — LACTATED RINGERS IV SOLN: INTRAVENOUS | Status: DC

## 2020-04-23 MED ORDER — FENTANYL CITRATE (PF) 100 MCG/2ML IJ SOLN
INTRAMUSCULAR | Status: AC
  Filled 2020-04-23: qty 2

## 2020-04-23 MED ORDER — BACITRACIN-NEOMYCIN-POLYMYXIN OINTMENT TUBE
TOPICAL_OINTMENT | CUTANEOUS | Status: AC
  Filled 2020-04-23: qty 14.17

## 2020-04-23 MED ORDER — LIDOCAINE-EPINEPHRINE 1 %-1:100000 IJ SOLN
INTRAMUSCULAR | Status: AC
  Filled 2020-04-23: qty 2

## 2020-04-23 MED ORDER — FENTANYL CITRATE (PF) 100 MCG/2ML IJ SOLN
INTRAMUSCULAR | Status: DC | PRN
  Administered 2020-04-23 (×2): 50 ug via INTRAVENOUS

## 2020-04-23 MED ORDER — BACITRACIN 500 UNIT/GM EX OINT
TOPICAL_OINTMENT | CUTANEOUS | Status: DC | PRN
  Administered 2020-04-23: 1 via TOPICAL

## 2020-04-23 MED ORDER — PROPOFOL 10 MG/ML IV BOLUS
INTRAVENOUS | Status: DC | PRN
  Administered 2020-04-23: 160 mg via INTRAVENOUS

## 2020-04-23 MED ORDER — OXYCODONE HCL 5 MG/5ML PO SOLN: 5.0000 mg | Freq: Once | ORAL | Status: DC | PRN

## 2020-04-23 MED ORDER — BUPIVACAINE HCL (PF) 0.25 % IJ SOLN
INTRAMUSCULAR | Status: AC
  Filled 2020-04-23: qty 90

## 2020-04-23 MED ORDER — LIDOCAINE 2% (20 MG/ML) 5 ML SYRINGE
INTRAMUSCULAR | Status: DC | PRN
  Administered 2020-04-23: 60 mg via INTRAVENOUS
  Administered 2020-04-23: 40 mg via INTRAVENOUS

## 2020-04-23 MED ORDER — SUGAMMADEX SODIUM 200 MG/2ML IV SOLN
INTRAVENOUS | Status: DC | PRN
Start: 2020-04-23 — End: 2020-04-23
  Administered 2020-04-23: 200 mg via INTRAVENOUS

## 2020-04-23 MED ORDER — FENTANYL CITRATE (PF) 100 MCG/2ML IJ SOLN: 25.0000 ug | INTRAMUSCULAR | Status: DC | PRN

## 2020-04-23 MED ORDER — PROPOFOL 10 MG/ML IV BOLUS
INTRAVENOUS | Status: AC
  Filled 2020-04-23: qty 20

## 2020-04-23 MED ORDER — CEPHALEXIN 500 MG PO CAPS: 500.0000 mg | ORAL_CAPSULE | Freq: Three times a day (TID) | ORAL | 0 refills | Status: DC

## 2020-04-23 MED ORDER — ROCURONIUM BROMIDE 100 MG/10ML IV SOLN
INTRAVENOUS | Status: DC | PRN
Start: 2020-04-23 — End: 2020-04-23
  Administered 2020-04-23: 50 mg via INTRAVENOUS

## 2020-04-23 MED ORDER — ONDANSETRON HCL 4 MG/2ML IJ SOLN: 4.0000 mg | Freq: Once | INTRAMUSCULAR | Status: DC | PRN

## 2020-04-23 MED ORDER — LIDOCAINE 2% (20 MG/ML) 5 ML SYRINGE
INTRAMUSCULAR | Status: AC
  Filled 2020-04-23: qty 5

## 2020-04-23 SURGICAL SUPPLY — 63 items
ADH SKN CLS APL DERMABOND .7 (GAUZE/BANDAGES/DRESSINGS)
APL SKNCLS STERI-STRIP NONHPOA (GAUZE/BANDAGES/DRESSINGS)
ATTRACTOMAT 16X20 MAGNETIC DRP (DRAPES) IMPLANT
BAND INSRT 18 STRL LF DISP RB (MISCELLANEOUS)
BAND RUBBER #18 3X1/16 STRL (MISCELLANEOUS) IMPLANT
BENZOIN TINCTURE PRP APPL 2/3 (GAUZE/BANDAGES/DRESSINGS) IMPLANT
BLADE SURG 15 STRL LF DISP TIS (BLADE) ×2 IMPLANT
BLADE SURG 15 STRL SS (BLADE) ×4
CANISTER SUCT 1200ML W/VALVE (MISCELLANEOUS) ×4 IMPLANT
CLEANER CAUTERY TIP 5X5 PAD (MISCELLANEOUS) IMPLANT
CLIP VESOCCLUDE MED 6/CT (CLIP) IMPLANT
CLOSURE WOUND 1/2 X4 (GAUZE/BANDAGES/DRESSINGS)
CORD BIPOLAR FORCEPS 12FT (ELECTRODE) IMPLANT
COVER BACK TABLE 60X90IN (DRAPES) ×4 IMPLANT
COVER MAYO STAND STRL (DRAPES) ×4 IMPLANT
COVER WAND RF STERILE (DRAPES) IMPLANT
DECANTER SPIKE VIAL GLASS SM (MISCELLANEOUS) IMPLANT
DERMABOND ADVANCED (GAUZE/BANDAGES/DRESSINGS)
DERMABOND ADVANCED .7 DNX12 (GAUZE/BANDAGES/DRESSINGS) IMPLANT
DRAPE U-SHAPE 76X120 STRL (DRAPES) ×4 IMPLANT
ELECT COATED BLADE 2.86 ST (ELECTRODE) ×4 IMPLANT
ELECT REM PT RETURN 9FT ADLT (ELECTROSURGICAL) ×4
ELECTRODE REM PT RTRN 9FT ADLT (ELECTROSURGICAL) ×2 IMPLANT
GAUZE 4X4 16PLY RFD (DISPOSABLE) IMPLANT
GAUZE SPONGE 4X4 12PLY STRL LF (GAUZE/BANDAGES/DRESSINGS) IMPLANT
GLOVE BIOGEL PI IND STRL 7.0 (GLOVE) ×2 IMPLANT
GLOVE BIOGEL PI INDICATOR 7.0 (GLOVE) ×2
GLOVE ECLIPSE 6.5 STRL STRAW (GLOVE) ×4 IMPLANT
GLOVE ECLIPSE 7.5 STRL STRAW (GLOVE) ×4 IMPLANT
GOWN STRL REUS W/ TWL LRG LVL3 (GOWN DISPOSABLE) ×4 IMPLANT
GOWN STRL REUS W/TWL LRG LVL3 (GOWN DISPOSABLE) ×8
MARKER SKIN DUAL TIP RULER LAB (MISCELLANEOUS) IMPLANT
NEEDLE HYPO 25X1 1.5 SAFETY (NEEDLE) ×4 IMPLANT
NEEDLE PRECISIONGLIDE 27X1.5 (NEEDLE) ×4 IMPLANT
NS IRRIG 1000ML POUR BTL (IV SOLUTION) ×4 IMPLANT
PACK BASIN DAY SURGERY FS (CUSTOM PROCEDURE TRAY) ×4 IMPLANT
PAD CLEANER CAUTERY TIP 5X5 (MISCELLANEOUS)
PENCIL FOOT CONTROL (ELECTRODE) ×4 IMPLANT
SCISSORS WIRE ANG 4 3/4 DISP (INSTRUMENTS) IMPLANT
SHEET MEDIUM DRAPE 40X70 STRL (DRAPES) IMPLANT
SPONGE GAUZE 2X2 8PLY STER LF (GAUZE/BANDAGES/DRESSINGS)
SPONGE GAUZE 2X2 8PLY STRL LF (GAUZE/BANDAGES/DRESSINGS) IMPLANT
STAPLER VISISTAT 35W (STAPLE) IMPLANT
STRIP CLOSURE SKIN 1/2X4 (GAUZE/BANDAGES/DRESSINGS) IMPLANT
SUCTION FRAZIER HANDLE 10FR (MISCELLANEOUS) ×4
SUCTION TUBE FRAZIER 10FR DISP (MISCELLANEOUS) ×2 IMPLANT
SUT CHROMIC 3 0 PS 2 (SUTURE) ×4 IMPLANT
SUT CHROMIC 4 0 P 3 18 (SUTURE) IMPLANT
SUT CHROMIC 4 0 PS 2 18 (SUTURE) IMPLANT
SUT ETHILON 4 0 PS 2 18 (SUTURE) IMPLANT
SUT ETHILON 5 0 P 3 18 (SUTURE)
SUT NYLON ETHILON 5-0 P-3 1X18 (SUTURE) IMPLANT
SUT PLAIN 5 0 P 3 18 (SUTURE) IMPLANT
SUT SILK 4 0 TIES 17X18 (SUTURE) IMPLANT
SUT VICRYL 4-0 PS2 18IN ABS (SUTURE) IMPLANT
SYR BULB EAR ULCER 3OZ GRN STR (SYRINGE) IMPLANT
SYR CONTROL 10ML LL (SYRINGE) ×4 IMPLANT
TAPE CLOTH SURG 4X10 WHT LF (GAUZE/BANDAGES/DRESSINGS) IMPLANT
TOWEL GREEN STERILE FF (TOWEL DISPOSABLE) ×4 IMPLANT
TRAY DSU PREP LF (CUSTOM PROCEDURE TRAY) IMPLANT
TUBE CONNECTING 20'X1/4 (TUBING) ×1
TUBE CONNECTING 20X1/4 (TUBING) ×3 IMPLANT
YANKAUER SUCT BULB TIP NO VENT (SUCTIONS) IMPLANT

## 2020-04-23 NOTE — Op Note (Signed)
OPERATIVE REPORT  DATE OF SURGERY: 04/23/2020  PATIENT:  [name] Blood.,  74 y.o. male  PRE-OPERATIVE DIAGNOSIS:  Retained arch bars,Scar upper lip  POST-OPERATIVE DIAGNOSIS:  Retained arch bars, Scar upper lip  PROCEDURE:  Procedure(s): MANDIBULAR HARDWARE REMOVAL SCAR REVISION/ Lip Repair upper  SURGEON:  Susy Frizzle, MD  ASSISTANTS: none  ANESTHESIA:   General   EBL: 30 ml  DRAINS: none  LOCAL MEDICATIONS USED:  1% Xylocaine with epinephrine  SPECIMEN:  none  COUNTS:  Correct  PROCEDURE DETAILS: The patient was taken to the operating room and placed on the operating table in the supine position.  Following induction of general endotracheal anesthesia, t All of the screws were identified, uncovered by mucosal overgrowth and removed with screwdriver. The upper and lower arch bar were then completely removed. The oral cavity was irrigated with saline and suctioned.  The upper lip was marked with a marking pen for excision of a notched scar.  This was infiltrated with local anesthetic solution.  1% Xylocaine with epinephrine was used.  A #15 scalpel was used to incise and remove the wedge of upper lip.  It was approximately 1 x 1.5 cm.  The defect was then cauterized with electrocautery for hemostasis.  3-0 chromic sutures were then used to reoppose the edges taking care to line up the vermilion border.  There was good apposition.  Bacitracin was applied.  The patient was awakened from anesthesia, extubated and transferred to recovery in stable condition.    PATIENT DISPOSITION:  To PACU, stable

## 2020-04-23 NOTE — Anesthesia Preprocedure Evaluation (Signed)
Anesthesia Evaluation  Patient identified by MRN, date of birth, ID band Patient awake    Reviewed: Allergy & Precautions, NPO status , Patient's Chart, lab work & pertinent test results  Airway Mallampati: II  TM Distance: >3 FB Neck ROM: Limited    Dental  (+) Dental Advisory Given,    Pulmonary former smoker,     + decreased breath sounds      Cardiovascular  Rhythm:Regular Rate:Normal     Neuro/Psych    GI/Hepatic   Endo/Other    Renal/GU      Musculoskeletal   Abdominal   Peds  Hematology   Anesthesia Other Findings   Reproductive/Obstetrics                             Anesthesia Physical Anesthesia Plan  ASA: III  Anesthesia Plan: General   Post-op Pain Management:    Induction: Intravenous  PONV Risk Score and Plan: Ondansetron and Dexamethasone  Airway Management Planned: Oral ETT  Additional Equipment:   Intra-op Plan:   Post-operative Plan: Extubation in OR  Informed Consent: I have reviewed the patients History and Physical, chart, labs and discussed the procedure including the risks, benefits and alternatives for the proposed anesthesia with the patient or authorized representative who has indicated his/her understanding and acceptance.     Dental advisory given  Plan Discussed with: CRNA and Anesthesiologist  Anesthesia Plan Comments:         Anesthesia Quick Evaluation

## 2020-04-23 NOTE — Anesthesia Procedure Notes (Signed)
Procedure Name: Intubation Date/Time: 04/23/2020 9:09 AM Performed by: Maryella Shivers, CRNA Pre-anesthesia Checklist: Patient identified, Emergency Drugs available, Suction available and Patient being monitored Patient Re-evaluated:Patient Re-evaluated prior to induction Oxygen Delivery Method: Circle system utilized Preoxygenation: Pre-oxygenation with 100% oxygen Induction Type: IV induction Ventilation: Mask ventilation without difficulty Laryngoscope Size: Mac and 3 Tube type: Oral Tube size: 7.5 mm Number of attempts: 1 Airway Equipment and Method: Stylet and Oral airway Placement Confirmation: ETT inserted through vocal cords under direct vision,  positive ETCO2 and breath sounds checked- equal and bilateral Secured at: 20 cm Tube secured with: Tape Dental Injury: Teeth and Oropharynx as per pre-operative assessment
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
## 2020-04-23 NOTE — Anesthesia Postprocedure Evaluation (Signed)
Anesthesia Post Note  Patient: [name] [name].  Procedure(s) Performed: MANDIBULAR HARDWARE REMOVAL (Bilateral Mouth) SCAR REVISION/ Upper Lip Repair (N/A Mouth)     Patient location during evaluation: PACU Anesthesia Type: General Level of consciousness: awake and alert Pain management: pain level controlled Vital Signs Assessment: post-procedure vital signs reviewed and stable Respiratory status: spontaneous breathing, nonlabored ventilation, respiratory function stable and patient connected to nasal cannula oxygen Cardiovascular status: blood pressure returned to baseline and stable Postop Assessment: no apparent nausea or vomiting Anesthetic complications: no   No complications documented.  Last Vitals:  Vitals:   04/23/20 1015 04/23/20 1036  BP: (!) 143/82 136/79  Pulse: 82 81  Resp: 14 16  Temp:  (!) 36.3 C  SpO2: 94% 96%    Last Pain:  Vitals:   04/23/20 1036  TempSrc:   PainSc: 1                  Isidro Monks COKER

## 2020-04-23 NOTE — Transfer of Care (Signed)
Immediate Anesthesia Transfer of Care Note  Patient: [name] [name].  Procedure(s) Performed: MANDIBULAR HARDWARE REMOVAL (Bilateral Mouth) SCAR REVISION/ Upper Lip Repair (N/A Mouth)  Patient Location: PACU  Anesthesia Type:General  Level of Consciousness: sedated  Airway & Oxygen Therapy: Patient Spontanous Breathing and Patient connected to face mask oxygen  Post-op Assessment: Report given to RN and Post -op Vital signs reviewed and stable  Post vital signs: Reviewed and stable  Last Vitals:  Vitals Value Taken Time  BP 152/92 04/23/20 0945  Temp    Pulse 88 04/23/20 0946  Resp 17 04/23/20 0946  SpO2 100 % 04/23/20 0946  Vitals shown include unvalidated device data.  Last Pain:  Vitals:   04/23/20 0733  TempSrc: Oral  PainSc: 1          Complications: No complications documented.

## 2020-04-23 NOTE — Discharge Instructions (Signed)
Keep the lip clean and dry.  Avoid eating anything where you have to stretch your lip at all.  Rinse your mouth with salt water several times daily until everything has healed.   Post Anesthesia Home Care Instructions  Activity: Get plenty of rest for the remainder of the day. A responsible individual must stay with you for 24 hours following the procedure.  For the next 24 hours, DO NOT: -Drive a car -Advertising copywriter -Drink alcoholic beverages -Take any medication unless instructed by your physician -Make any legal decisions or sign important papers.  Meals: Start with liquid foods such as gelatin or soup. Progress to regular foods as tolerated. Avoid greasy, spicy, heavy foods. If nausea and/or vomiting occur, drink only clear liquids until the nausea and/or vomiting subsides. Call your physician if vomiting continues.  Special Instructions/Symptoms: Your throat may feel dry or sore from the anesthesia or the breathing tube placed in your throat during surgery. If this causes discomfort, gargle with warm salt water. The discomfort should disappear within 24 hours.  If you had a scopolamine patch placed behind your ear for the management of post- operative nausea and/or vomiting:  1. The medication in the patch is effective for 72 hours, after which it should be removed.  Wrap patch in a tissue and discard in the trash. Wash hands thoroughly with soap and water. 2. You may remove the patch earlier than 72 hours if you experience unpleasant side effects which may include dry mouth, dizziness or visual disturbances. 3. Avoid touching the patch. Wash your hands with soap and water after contact with the patch.

## 2020-04-24 ENCOUNTER — Encounter (HOSPITAL_BASED_OUTPATIENT_CLINIC_OR_DEPARTMENT_OTHER): Payer: Self-pay | Admitting: Otolaryngology

## 2020-05-01 ENCOUNTER — Other Ambulatory Visit: Payer: Self-pay

## 2020-05-01 ENCOUNTER — Other Ambulatory Visit: Payer: Medicare HMO

## 2020-05-01 DIAGNOSIS — I5022 Chronic systolic (congestive) heart failure: Secondary | ICD-10-CM

## 2020-05-01 DIAGNOSIS — E782 Mixed hyperlipidemia: Secondary | ICD-10-CM | POA: Diagnosis not present

## 2020-05-01 DIAGNOSIS — I2510 Atherosclerotic heart disease of native coronary artery without angina pectoris: Secondary | ICD-10-CM

## 2020-05-01 LAB — BASIC METABOLIC PANEL
BUN/Creatinine Ratio: 17 (ref 10–24)
BUN: 13 mg/dL (ref 8–27)
CO2: 25 mmol/L (ref 20–29)
Calcium: 8.9 mg/dL (ref 8.6–10.2)
Chloride: 107 mmol/L — ABNORMAL HIGH (ref 96–106)
Creatinine, Ser: 0.75 mg/dL — ABNORMAL LOW (ref 0.76–1.27)
GFR calc Af Amer: 104 mL/min/{1.73_m2} (ref >59)
GFR calc non Af Amer: 90 mL/min/{1.73_m2} (ref >59)
Glucose: 85 mg/dL (ref 65–99)
Potassium: 4.4 mmol/L (ref 3.5–5.2)
Sodium: 142 mmol/L (ref 134–144)

## 2020-05-01 LAB — HEPATIC FUNCTION PANEL
ALT: 23 IU/L (ref 0–44)
AST: 21 IU/L (ref 0–40)
Albumin: 3.8 g/dL (ref 3.7–4.7)
Alkaline Phosphatase: 77 IU/L (ref 48–121)
Bilirubin Total: 0.4 mg/dL (ref 0.0–1.2)
Bilirubin, Direct: 0.13 mg/dL (ref 0.00–0.40)
Total Protein: 5.8 g/dL — ABNORMAL LOW (ref 6.0–8.5)

## 2020-05-01 LAB — LIPID PANEL
Chol/HDL Ratio: 4 ratio (ref 0.0–5.0)
Cholesterol, Total: 163 mg/dL (ref 100–199)
HDL: 41 mg/dL (ref >39)
LDL Chol Calc (NIH): 101 mg/dL — ABNORMAL HIGH (ref 0–99)
Triglycerides: 113 mg/dL (ref 0–149)
VLDL Cholesterol Cal: 21 mg/dL (ref 5–40)

## 2020-05-07 DIAGNOSIS — H2513 Age-related nuclear cataract, bilateral: Secondary | ICD-10-CM | POA: Diagnosis not present

## 2020-05-07 DIAGNOSIS — H35363 Drusen (degenerative) of macula, bilateral: Secondary | ICD-10-CM | POA: Diagnosis not present

## 2020-05-07 DIAGNOSIS — Z0100 Encounter for examination of eyes and vision without abnormal findings: Secondary | ICD-10-CM | POA: Diagnosis not present

## 2020-05-07 DIAGNOSIS — H5200 Hypermetropia, unspecified eye: Secondary | ICD-10-CM | POA: Diagnosis not present

## 2020-05-09 ENCOUNTER — Ambulatory Visit (INDEPENDENT_AMBULATORY_CARE_PROVIDER_SITE_OTHER): Payer: Medicare HMO | Admitting: Licensed Clinical Social Worker

## 2020-05-09 DIAGNOSIS — S02412D LeFort II fracture, subsequent encounter for fracture with routine healing: Secondary | ICD-10-CM

## 2020-05-09 DIAGNOSIS — I2510 Atherosclerotic heart disease of native coronary artery without angina pectoris: Secondary | ICD-10-CM | POA: Diagnosis not present

## 2020-05-09 DIAGNOSIS — E785 Hyperlipidemia, unspecified: Secondary | ICD-10-CM | POA: Diagnosis not present

## 2020-05-09 NOTE — Chronic Care Management (AMB) (Signed)
Chronic Care Management    Clinical Social Work Follow Up Note  05/09/2020 Name: [name] [name]. MRN: [id number] DOB: 1945-11-26  [name] [name]. is a 74 y.o. year old male who is a primary care patient of Gwenlyn Fudge, FNP. The CCM team was consulted for assistance with [employer] .   Review of patient status, including review of consultants reports, other relevant assessments, and collaboration with appropriate care team members and the patient's provider was performed as part of comprehensive patient evaluation and provision of chronic care management services.    SDOH (Social Determinants of Health) assessments performed: No;risk for tobacco use; risk for depression; risk for food insecurity; risk for transport needs    Chronic Care Management from 04/04/2020 in [facility]  PHQ-9 Total Score 5        GAD 7 : Generalized Anxiety Score 04/04/2020  Nervous, Anxious, on Edge 1  Control/stop worrying 0  Worry too much - different things 0  Trouble relaxing 0  Restless 0  Easily annoyed or irritable 0  Afraid - awful might happen 0  Total GAD 7 Score 1  Anxiety Difficulty Somewhat difficult    Outpatient Encounter Medications as of 05/09/2020  Medication Sig  . aspirin 81 MG chewable tablet Chew 81 mg by mouth daily.  . cephALEXin (KEFLEX) 500 MG capsule Take 1 capsule (500 mg total) by mouth 3 (three) times daily.  . cholecalciferol (VITAMIN D3) 25 MCG (1000 UNIT) tablet Take 1,000 Units by mouth daily.   [name] HYDROcodone-acetaminophen (NORCO) 7.5-325 MG tablet Take 1 tablet by mouth every 6 (six) hours as needed for moderate pain.  . nitroGLYCERIN (NITROSTAT) 0.4 MG SL tablet Place 1 tablet (0.4 mg total) under the tongue every 5 (five) minutes as needed for chest pain.  [name] ondansetron (ZOFRAN-ODT) 8 MG disintegrating tablet Take 1 tablet (8 mg total) by mouth every 8 (eight) hours as needed for nausea or vomiting.  . rosuvastatin (CRESTOR)  20 MG tablet Take 1 tablet (20 mg total) by mouth daily.  . tadalafil (CIALIS) 10 MG tablet Take 10 mg by mouth daily as needed.    No facility-administered encounter medications on file as of 05/09/2020.    Goals    .  Client will talk with LCSW in next 30 days about health needs of client and client completion of ADLs (pt-stated)      CARE PLAN ENTRY   Current Barriers:  . Patient with chronic diagnoses of CAD, HLD, Facial fracture, Coronary  Atherosclerosis, [name]  Clinical Social Work Clinical Goal(s):  [name] LCSW will call client in next 4 weeks to talk with client about health needs of client and client completion of daily activities  Interventions:  Talked with client about CCM program support Talked with client about RNCM nursing support Talked with client about recent surgery of client Talked with client about eating/chewing challenges of client Talked with client about pain issues of client Talked with client about his upcoming medical appointments Talked with client about transport needs of client Talked with client about sleeping challenges of client Talked with [name] [name] about family stress issues with his son Talked with [name] [name] about support with cousin, neighbor Talked with [name] [name] about his completion of ADLs Talked with [name] [name] about relaxation techniques (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 He said with injury he sustained he lost several teeth)  Patient Self Care Activities:   Completes ADLs independently  Patient Self Care Deficits:    Pain issues Eating issues due to recent surgery .   Initial goal documentation     Follow Up Plan: LCSW to call client in next 4 weeks to talk with client about health needs of client and to talk with him  about client completion of daily activities  Kelton Pillar.Denina Rieger MSW, LCSW Licensed Clinical Social Worker [facility] [phone]

## 2020-05-09 NOTE — Patient Instructions (Addendum)
Licensed Clinical Child psychotherapist Visit Information  Goals we discussed today:    Client will talk with LCSW in next 30 days about health needs of client and client completion of ADLs (pt-stated)         CARE PLAN ENTRY   Current Barriers:   Patient with chronic diagnoses of CAD, HLD, Facial fracture, Coronary  Atherosclerosis, [name] [name] fracture  Clinical Social Work Clinical Goal(s):   LCSW will call client in next 4 weeks to talk with client about health needs of client and client completion of daily activities  Interventions:  Talked with client about CCM program support Talked with client about RNCM nursing support Talked with client about recent surgery of client Talked with client about eating/chewing challenges of client Talked with client about pain issues of client Talked with client about his upcoming medical appointments Talked with client about transport needs of client Talked with client about sleeping challenges of client Talked with Gene about family stress issues with his son Talked with Gene about support with cousin, neighbor Talked with Gene about his completion of ADLs Talked with Gene about relaxation techniques (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 He said with injury he sustained he lost several teeth)  Patient Self Care Activities:   Completes ADLs independently  Patient Self Care Deficits:    Pain issues Eating issues due to recent surgery    Initial goal documentation     Follow Up Plan: LCSW to call client in next 4 weeks to talk with client about health needs of client and to talk with him about client completion of daily activities  Materials Provided: No  The patient verbalized understanding of  instructions provided today and declined a print copy of patient instruction materials.   [name] [name].[name] MSW, LCSW Licensed Clinical Social Worker [facility] [phone]

## 2020-05-28 ENCOUNTER — Ambulatory Visit (INDEPENDENT_AMBULATORY_CARE_PROVIDER_SITE_OTHER): Payer: Medicare HMO | Admitting: *Deleted

## 2020-05-28 DIAGNOSIS — S02412D LeFort II fracture, subsequent encounter for fracture with routine healing: Secondary | ICD-10-CM

## 2020-05-28 DIAGNOSIS — I2510 Atherosclerotic heart disease of native coronary artery without angina pectoris: Secondary | ICD-10-CM | POA: Diagnosis not present

## 2020-05-28 DIAGNOSIS — E785 Hyperlipidemia, unspecified: Secondary | ICD-10-CM
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
## 2020-06-14 ENCOUNTER — Ambulatory Visit: Payer: Medicare HMO | Admitting: Licensed Clinical Social Worker

## 2020-06-14 DIAGNOSIS — I2510 Atherosclerotic heart disease of native coronary artery without angina pectoris: Secondary | ICD-10-CM | POA: Diagnosis not present

## 2020-06-14 DIAGNOSIS — E785 Hyperlipidemia, unspecified: Secondary | ICD-10-CM

## 2020-06-14 DIAGNOSIS — S02412D LeFort II fracture, subsequent encounter for fracture with routine healing: Secondary | ICD-10-CM
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
## 2020-07-17 DIAGNOSIS — Z8612 Personal history of poliomyelitis: Secondary | ICD-10-CM | POA: Diagnosis not present

## 2020-07-17 DIAGNOSIS — I252 Old myocardial infarction: Secondary | ICD-10-CM | POA: Diagnosis not present

## 2020-07-17 DIAGNOSIS — Z87891 Personal history of nicotine dependence: Secondary | ICD-10-CM | POA: Diagnosis not present

## 2020-07-17 DIAGNOSIS — Z8249 Family history of ischemic heart disease and other diseases of the circulatory system: Secondary | ICD-10-CM | POA: Diagnosis not present

## 2020-07-17 DIAGNOSIS — I2510 Atherosclerotic heart disease of native coronary artery without angina pectoris: Secondary | ICD-10-CM | POA: Diagnosis not present

## 2020-07-20 ENCOUNTER — Telehealth: Payer: Medicare HMO

## 2020-07-24 ENCOUNTER — Telehealth: Payer: Medicare HMO

## 2020-08-20 ENCOUNTER — Telehealth: Payer: Medicare HMO | Admitting: *Deleted

## 2020-08-20 ENCOUNTER — Telehealth: Payer: Self-pay | Admitting: *Deleted

## 2020-08-20 NOTE — Telephone Encounter (Signed)
  Chronic Care Management   Outreach Note  08/20/2020 Name: [name] [name]. MRN: [id number] DOB: March 01, 1946  Referred by: Gwenlyn Fudge, FNP Reason for referral : Chronic Care Management (RN follow up)   An unsuccessful follow-up Telephone Visit was attempted today. The patient was referred to the case management team for assistance with care management and care coordination.   Clinical Goals: . Over the next 30 days, patient will be contacted by a Care Guide to reschedule their Initial CCM Visit . Over the next 30 days, patient will have an Initial CCM Visit with a member of the embedded CCM team to discuss self-management of their chronic medical conditions  Interventions and Plan . Chart reviewed in preparation for telephone visit . Collaboration with other care team members as needed . Unsuccessful outreach to patient   . A HIPAA compliant phone message was left for the patient providing contact information and requesting a return call.  . Request sent to care guides to reach out and reschedule patient's follow-up visit   Demetrios Loll, BSN, RN-BC Embedded Chronic Care Manager [facility] / [facility] Care Management Direct Dial: [phone]

## 2020-08-23 NOTE — Telephone Encounter (Signed)
R/s

## 2020-08-24 ENCOUNTER — Ambulatory Visit: Payer: Medicare HMO | Admitting: Licensed Clinical Social Worker

## 2020-08-24 DIAGNOSIS — I2510 Atherosclerotic heart disease of native coronary artery without angina pectoris: Secondary | ICD-10-CM

## 2020-08-24 DIAGNOSIS — S02412D LeFort II fracture, subsequent encounter for fracture with routine healing: Secondary | ICD-10-CM

## 2020-08-24 DIAGNOSIS — E785 Hyperlipidemia, unspecified: Secondary | ICD-10-CM
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
## 2020-08-24 NOTE — Patient Instructions (Addendum)
Licensed Clinical Actuary Provided: No   .  Client will talk with LCSW in next 30 days about health needs of client and client completion of ADLs (pt-stated)        CARE PLAN ENTRY   Current Barriers:   Patient with chronic diagnoses of CAD, HLD, Facial fracture, Coronary  Atherosclerosis, [name]  Clinical Social Work Clinical Goal(s):   LCSW will call client in next 4 weeks to talk with client about health needs of client and client completion of daily activities  Interventions: Talked with client about CCM program support Talked with client about RNCM nursing support Talked with client about eating/chewing challenges of client Talked with client about pain issues of client Talked with client about his upcoming medical appointments Talked with client about transport needs of client Talked with client about sleeping challenges of client Talked with Gene about family stress issues with his son Talked with client about legal issues of his son Talked with client about his plans to talk with dental group about his teeth and how to manage his current dental needs  Patient Self Care Activities:   Completes ADLs independently  Patient Self Care Deficits:    Pain issues Eating issues due to recent surgery    Initial goal documentation     Follow Up Plan:LCSW to call client in next 4 weeks to talk with client about health needs of client and to talk with him about client completion of daily activities  The patient verbalized understanding of instructions provided today and declined a print copy of patient instruction materials.   Kelton Pillar.Terrick Allred MSW, LCSW Licensed Clinical Social Worker [facility] Care Management [phone]

## 2020-10-01 ENCOUNTER — Ambulatory Visit: Payer: Medicare HMO | Admitting: *Deleted

## 2020-10-01 ENCOUNTER — Ambulatory Visit: Payer: Medicare HMO | Admitting: Licensed Clinical Social Worker

## 2020-10-01 DIAGNOSIS — E785 Hyperlipidemia, unspecified: Secondary | ICD-10-CM

## 2020-10-01 DIAGNOSIS — S02412D LeFort II fracture, subsequent encounter for fracture with routine healing: Secondary | ICD-10-CM

## 2020-10-01 DIAGNOSIS — I25119 Atherosclerotic heart disease of native coronary artery with unspecified angina pectoris: Secondary | ICD-10-CM

## 2020-10-01 DIAGNOSIS — I2510 Atherosclerotic heart disease of native coronary artery without angina pectoris: Secondary | ICD-10-CM

## 2020-10-01 NOTE — Patient Instructions (Addendum)
Licensed Clinical Social Worker Visit Information  Goals we discussed today:   .  Client will talk with LCSW in next 30 days about health needs of client and client completion of ADLs (pt-stated)        CARE PLAN ENTRY   Current Barriers:   Patient with chronic diagnoses of CAD, HLD, Facial fracture, Coronary  Atherosclerosis, [name]  Clinical Social Work Clinical Goal(s):   LCSW will call client in next 4 weeks to talk with client about health needs of client and client completion of daily activities  Interventions:  Talked with client about CCM program support Talked with client about RNCM nursing support Talked with client about eating/chewing challenges of client Talked with client about pain issues of client Talked with client about his upcoming medical appointments Talked with client about transport needs of client Talked with client about sleeping challenges of client Talked with [name] [name] about his completion of ADLs Talked with [name] [name] about communication of client Provided counseling support for client Talked with client about vision of client (he has had new glasses for a few months) Collaborated with RNCM Demetrios Loll regarding dental needs of client  Patient Self Care Activities:   Completes ADLs independently  Patient Self Care Deficits:    Pain issues Eating issues due to recent surgery    Initial goal documentation     Follow Up Plan:LCSW to call client in next 4 weeks to talk with client about health needs of client and to talk with him about client completion of daily activities  Materials Provided: No  The patient verbalized understanding of instructions provided today and declined a print copy of patient instruction materials.   [name] [name].Aleasha Fregeau MSW, LCSW Licensed Clinical Social Worker [facility] Care Management [phone]

## 2020-10-01 NOTE — Chronic Care Management (AMB) (Signed)
  Chronic Care Management   Note  10/01/2020 Name: [name] [name]. MRN: [id number] DOB: 1946/05/30   Consulted by Lorna Few, LCSW for assistance with placing a CCM Care Guide referral regarding dental resources for Mr [name] [name].   Referral placed  Follow up plan: The patient has been provided with contact information for the care management team and has been advised to call with any health related questions or concerns.  The care management team will reach out to the patient again over the next 30 days.   Demetrios Loll, BSN, RN-BC Embedded Chronic Care Manager [facility] / [facility] Care Management Direct Dial: [phone]

## 2020-10-01 NOTE — Chronic Care Management (AMB) (Signed)
Chronic Care Management    Clinical Social Work Follow Up Note  10/01/2020 Name: Tayveon Lombardo. MRN: [id number] DOB: May 08, 1946  [name] is a 75 y.o. year old male who is a primary care patient of Gwenlyn Fudge, FNP. The CCM team was consulted for assistance with [employer] .   Review of patient status, including review of consultants reports, other relevant assessments, and collaboration with appropriate care team members and the patient's provider was performed as part of comprehensive patient evaluation and provision of chronic care management services.    SDOH (Social Determinants of Health) assessments performed: No; risk for tobacco use; risk for depression; risk for stress; risk for food insecurity; risk for transport needs  Flowsheet Row Chronic Care Management from 04/04/2020 in [facility]  PHQ-9 Total Score 5       GAD 7 : Generalized Anxiety Score 04/04/2020  Nervous, Anxious, on Edge 1  Control/stop worrying 0  Worry too much - different things 0  Trouble relaxing 0  Restless 0  Easily annoyed or irritable 0  Afraid - awful might happen 0  Total GAD 7 Score 1  Anxiety Difficulty Somewhat difficult    Outpatient Encounter Medications as of 10/01/2020  Medication Sig  . aspirin 81 MG chewable tablet Chew 81 mg by mouth daily.  . cephALEXin (KEFLEX) 500 MG capsule Take 1 capsule (500 mg total) by mouth 3 (three) times daily.  . cholecalciferol (VITAMIN D3) 25 MCG (1000 UNIT) tablet Take 1,000 Units by mouth daily.   [name] HYDROcodone-acetaminophen (NORCO) 7.5-325 MG tablet Take 1 tablet by mouth every 6 (six) hours as needed for moderate pain.  . nitroGLYCERIN (NITROSTAT) 0.4 MG SL tablet Place 1 tablet (0.4 mg total) under the tongue every 5 (five) minutes as needed for chest pain.  [name] ondansetron (ZOFRAN-ODT) 8 MG disintegrating tablet Take 1 tablet (8 mg total) by mouth every 8 (eight) hours as needed for nausea or vomiting.   . rosuvastatin (CRESTOR) 20 MG tablet Take 1 tablet (20 mg total) by mouth daily.  . tadalafil (CIALIS) 10 MG tablet Take 10 mg by mouth daily as needed.    No facility-administered encounter medications on file as of 10/01/2020.    Goals    .  Client will talk with LCSW in next 30 days about health needs of client and client completion of ADLs (pt-stated)      CARE PLAN ENTRY   Current Barriers:  . Patient with chronic diagnoses of CAD, HLD, Facial fracture, Coronary  Atherosclerosis, [name]  Clinical Social Work Clinical Goal(s):  [name] LCSW will call client in next 4 weeks to talk with client about health needs of client and client completion of daily activities  Interventions:  Talked with client about CCM program support Talked with client about RNCM nursing support Talked with client about eating/chewing challenges of client Talked with client about pain issues of client Talked with client about his upcoming medical appointments Talked with client about transport needs of client Talked with client about sleeping challenges of client Talked with Gene about his completion of ADLs Talked with Gene about communication of client Provided counseling support for client Talked with client about vision of client (he has had new glasses for a few months) Collaborated with RNCM Demetrios Loll regarding dental needs of client  Patient Self Care Activities:   Completes ADLs independently  Patient Self Care Deficits:    Pain issues Eating issues due to  recent surgery .   Initial goal documentation     Follow Up Plan:LCSW to call client in next 4 weeks to talk with client about health needs of client and to talk with him about client completion of daily activities  Kelton Pillar.Daschel Roughton MSW, LCSW Licensed Clinical Social Worker [facility] Care Management [phone]

## 2020-10-15 ENCOUNTER — Telehealth: Payer: Self-pay | Admitting: Family Medicine

## 2020-10-15 NOTE — Telephone Encounter (Signed)
   Telephone encounter was:  Successful.  10/15/2020 Name: [name] [name]. MRN: [id number] DOB: [birth year]  [name] [name]. is a 75 y.o. year old male who is a primary care patient of Gwenlyn Fudge, FNP . The community resource team was consulted for assistance with Dental Assistance  Care guide performed the following interventions: Spoke with patient today regarding referral for dental assistance with implants. Paitent stated that he has an appointment scheduled for 10/19/2020 for dental implants, but he cannot afford it. Patient stated he has been researching grants for seniors for dental implants, but has been receiving road blocks. Care Guide will perform research to see if there are any grants available to assist him and also see if there are any additional resources in the community..  Follow Up Plan:  Care Guide will give patient a call back within the week.   [facility] Care Guide, Embedded Care Coordination [facility], Care Management Phone: [phone] Email: [email or web address]

## 2020-10-16 NOTE — Telephone Encounter (Signed)
   Telephone encounter was:  Successful.  10/16/2020 Name: [name] [name]. MRN: [id number] DOB: 1945/11/02  [name] [name]. is a 75 y.o. year old male who is a primary care patient of Gwenlyn Fudge, FNP . The community resource team was consulted for assistance with Dental Assistance  Care guide performed the following interventions: Spoke with patient today regarding referral. Informed patient that Care Guide has researched different organizations regarding assistance for dental implants, but was not able to find assistance availble within the [facility] area. Patient stated understanding. Informed patient that if Care Guide finds any information he will receive a call back. .  Follow Up Plan:  Care guide will follow up with patient by phone over the next week.   [facility] Care Guide, Embedded Care Coordination [facility], Care Management Phone: [phone] Email: [email or web address]

## 2020-10-24 NOTE — Telephone Encounter (Signed)
   Telephone encounter was:  Successful.  10/24/2020 Name: [name] [name]. MRN: [id number] DOB: [birth year]  [name] [name]. is a 75 y.o. year old male who is a primary care patient of Gwenlyn Fudge, FNP . The community resource team was consulted for assistance with Dental Assistance  Care guide performed the following interventions: Spoke with patient today regarding referral. Explained that Care Guide searched several sources on the web pertaining to dental implants, but was unable to find a reliable source that would be able to help him. Paitent stated understanding. No addtional needs at this time.  .  Follow Up Plan:  No further follow up planned at this time. The patient has been provided with needed resources. Referral will be closed.   [facility] Care Guide, Embedded Care Coordination [facility], Care Management Phone: [phone] Email: [email or web address]

## 2020-11-02 ENCOUNTER — Ambulatory Visit (INDEPENDENT_AMBULATORY_CARE_PROVIDER_SITE_OTHER): Payer: Medicare HMO | Admitting: Nurse Practitioner

## 2020-11-02 ENCOUNTER — Ambulatory Visit (INDEPENDENT_AMBULATORY_CARE_PROVIDER_SITE_OTHER): Payer: Medicare HMO | Admitting: Licensed Clinical Social Worker

## 2020-11-02 ENCOUNTER — Encounter: Payer: Self-pay | Admitting: Nurse Practitioner

## 2020-11-02 ENCOUNTER — Other Ambulatory Visit: Payer: Self-pay

## 2020-11-02 VITALS — BP 126/69 | HR 88 | Temp 97.3°F | Ht 66.0 in | Wt 150.8 lb

## 2020-11-02 DIAGNOSIS — E785 Hyperlipidemia, unspecified: Secondary | ICD-10-CM

## 2020-11-02 DIAGNOSIS — I25119 Atherosclerotic heart disease of native coronary artery with unspecified angina pectoris: Secondary | ICD-10-CM | POA: Diagnosis not present

## 2020-11-02 DIAGNOSIS — S02412D LeFort II fracture, subsequent encounter for fracture with routine healing: Secondary | ICD-10-CM

## 2020-11-02 DIAGNOSIS — I2510 Atherosclerotic heart disease of native coronary artery without angina pectoris: Secondary | ICD-10-CM | POA: Diagnosis not present

## 2020-11-02 DIAGNOSIS — K047 Periapical abscess without sinus: Secondary | ICD-10-CM | POA: Insufficient documentation

## 2020-11-02 MED ORDER — AMOXICILLIN-POT CLAVULANATE 875-125 MG PO TABS: 1.0000 | ORAL_TABLET | Freq: Two times a day (BID) | ORAL | 0 refills | Status: DC
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
## 2020-11-02 NOTE — Patient Instructions (Addendum)
Augmentin sent to pharmacy, follow-up with worsening or unresolved symptoms.  Dental Abscess  A dental abscess is an area of pus in or around a tooth. It comes from an infection. It can cause pain and other symptoms. Treatment will help with symptoms and prevent the infection from spreading. Follow these instructions at home: Medicines  Take over-the-counter and prescription medicines only as told by your dentist.  If you were prescribed an antibiotic medicine, take it as told by your dentist. Do not stop taking it even if you start to feel better.  If you were prescribed a gel that has numbing medicine in it, use it exactly as told.  Do not drive or use heavy machinery (like a Surveyor, mining) while taking prescription pain medicine. General instructions  Rinse out your mouth often with salt water. ? To make salt water, dissolve -1 tsp of salt in 1 cup of warm water.  Eat a soft diet while your mouth is healing.  Drink enough fluid to keep your urine pale yellow.  Do not apply heat to the outside of your mouth.  Do not use any products that contain nicotine or tobacco. These include cigarettes and e-cigarettes. If you need help quitting, ask your doctor.  Keep all follow-up visits as told by your dentist. This is important.   Prevent an abscess  Brush your teeth every morning and every night. Use fluoride toothpaste.  Floss your teeth each day.  Get dental cleanings as often as told by your dentist.  Think about getting dental sealant put on teeth that have deep holes (decay).  Drink water that has fluoride in it. ? Most tap water has fluoride. ? Check the label on bottled water to see if it has fluoride in it.  Drink water instead of sugary drinks.  Eat healthy meals and snacks.  Wear a mouth guard or face shield when you play sports. Contact a doctor if:  Your pain is worse, and medicine does not help. Get help right away if:  You have a fever or chills.  Your  symptoms suddenly get worse.  You have a very bad headache.  You have problems breathing or swallowing.  You have trouble opening your mouth.  You have swelling in your neck or close to your eye. Summary  A dental abscess is an area of pus in or around a tooth. It is caused by an infection.  Treatment will help with symptoms and prevent the infection from spreading.  Take over-the-counter and prescription medicines only as told by your dentist.  To prevent an abscess, take good care of your teeth. Brush your teeth every morning and night. Use floss every day.  Get dental cleanings as often as told by your dentist. This information is not intended to replace advice given to you by your health care provider. Make sure you discuss any questions you have with your health care provider. Document Revised: 03/23/2020 Document Reviewed: 03/23/2020 Elsevier Patient Education  2021 [employer].

## 2020-11-02 NOTE — Assessment & Plan Note (Signed)
Worsening dental abscess.  Symptoms of pain and left gum sensitivity.  Patient is scheduled to have dental reconstruction in the next few weeks.  Started patient on Augmentin for 7 days.  Follow-up with worsening or unresolved symptoms.  Education provided to patient with printed handouts given. Rx sent to pharmacy.

## 2020-11-02 NOTE — Patient Instructions (Addendum)
Licensed Clinical Social Worker Visit Information  Goals we discussed today:  .  Client will talk with LCSW in next 30 days about health needs of client and client completion of ADLs (pt-stated)        CARE PLAN ENTRY   Current Barriers:   Patient with chronic diagnoses of CAD, HLD, Facial fracture, Coronary  Atherosclerosis, [name]  Clinical Social Work Clinical Goal(s):   LCSW will call client in next 4 weeks to talk with client about health needs of client and client completion of daily activities  Interventions:  Talked with client about his current needs Talked with client about his dental care needs Provided counseling support for client Talked with client about pain issues of client Talked with client about sleeping issues of client Talked with client about eating/chewing challenges of client Collaborated with RNCM regarding nursing needs of client Talked with client about dentist he sees for routine, basic dental needs  Patient Self Care Activities:   Completes ADLs independently  Patient Self Care Deficits:    Pain issues Eating issues due to recent surgery  Initial goal documentation     Follow Up Plan:LCSW to call client in next 4 weeks to talk with client about health needs of client and to talk with him about client completion of daily activities  Materials Provided: No  The patient verbalized understanding of instructions provided today and declined a print copy of patient instruction materials.   Kelton Pillar.Yaquelin Langelier MSW, LCSW Licensed Clinical Social Worker [facility] Care Management [phone]

## 2020-11-02 NOTE — Chronic Care Management (AMB) (Signed)
Chronic Care Management    Clinical Social Work Follow Up Note  11/02/2020 Name: [name] [name]. MRN: [id number] DOB: 04-25-1946  [name] [name]. is a 75 y.o. year old male who is a primary care patient of Gwenlyn Fudge, FNP. The CCM team was consulted for assistance with [employer] .   Review of patient status, including review of consultants reports, other relevant assessments, and collaboration with appropriate care team members and the patient's provider was performed as part of comprehensive patient evaluation and provision of chronic care management services.    SDOH (Social Determinants of Health) assessments performed: No; risk for tobacco use; risk for depression; risk for food insecurity; risk for stress; risk for transport needs  Flowsheet Row Chronic Care Management from 04/04/2020 in [facility]  PHQ-9 Total Score 5      GAD 7 : Generalized Anxiety Score 04/04/2020  Nervous, Anxious, on Edge 1  Control/stop worrying 0  Worry too much - different things 0  Trouble relaxing 0  Restless 0  Easily annoyed or irritable 0  Afraid - awful might happen 0  Total GAD 7 Score 1  Anxiety Difficulty Somewhat difficult    Outpatient Encounter Medications as of 11/02/2020  Medication Sig  . aspirin 81 MG chewable tablet Chew 81 mg by mouth daily.  . cephALEXin (KEFLEX) 500 MG capsule Take 1 capsule (500 mg total) by mouth 3 (three) times daily.  . cholecalciferol (VITAMIN D3) 25 MCG (1000 UNIT) tablet Take 1,000 Units by mouth daily.   [name] HYDROcodone-acetaminophen (NORCO) 7.5-325 MG tablet Take 1 tablet by mouth every 6 (six) hours as needed for moderate pain.  . nitroGLYCERIN (NITROSTAT) 0.4 MG SL tablet Place 1 tablet (0.4 mg total) under the tongue every 5 (five) minutes as needed for chest pain.  [name] ondansetron (ZOFRAN-ODT) 8 MG disintegrating tablet Take 1 tablet (8 mg total) by mouth every 8 (eight) hours as needed for nausea or vomiting.   . rosuvastatin (CRESTOR) 20 MG tablet Take 1 tablet (20 mg total) by mouth daily.  . tadalafil (CIALIS) 10 MG tablet Take 10 mg by mouth daily as needed.    No facility-administered encounter medications on file as of 11/02/2020.   Goals    .  Client will talk with LCSW in next 30 days about health needs of client and client completion of ADLs (pt-stated)      CARE PLAN ENTRY   Current Barriers:  . Patient with chronic diagnoses of CAD, HLD, Facial fracture, Coronary  Atherosclerosis, [name] [name] fracture  Clinical Social Work Clinical Goal(s):  [name] LCSW will call client in next 4 weeks to talk with client about health needs of client and client completion of daily activities  Interventions:  Talked with client about his current needs Talked with client about his dental care needs Provided counseling support for client Talked with client about pain issues of client Talked with client about sleeping issues of client Talked with client about eating/chewing challenges of client Collaborated with RNCM regarding nursing needs of client Talked with client about dentist he sees for routine, basic dental needs   Patient Self Care Activities:   Completes ADLs independently  Patient Self Care Deficits:    Pain issues Eating issues due to recent surgery .   Initial goal documentation     Follow Up Plan:LCSW to call client in next 4 weeks to talk with client about health needs of client and to talk with him about  client completion of daily activities  [name] [name] MSW, LCSW Licensed Clinical Social Worker [facility] Care Management [phone]

## 2020-11-08 ENCOUNTER — Telehealth: Payer: Self-pay

## 2020-11-08 NOTE — Telephone Encounter (Signed)
We do not give refills on antibiotics.  He was treated for an appropriate amount of time to resolve the infection per chart review.

## 2020-11-08 NOTE — Telephone Encounter (Signed)
Pt called stating that he is getting ready to go out of town and says the antibiotic he is taking for his abscessed tooth is helping but wants to know if he can get a refill on the medicine in case he has issues while out of town?  Please advise and call patient.

## 2020-11-08 NOTE — Telephone Encounter (Signed)
Pt called back to see if refill had been approved/sent to pharmacy.  Pt says he is going out of town tomorrow morning and really needs refills sent in today.

## 2020-11-09 NOTE — Telephone Encounter (Signed)
Patient aware and verbalizes understanding. 

## 2020-12-04 ENCOUNTER — Ambulatory Visit: Payer: Medicare Other | Admitting: Licensed Clinical Social Worker

## 2020-12-04 DIAGNOSIS — E785 Hyperlipidemia, unspecified: Secondary | ICD-10-CM | POA: Diagnosis not present

## 2020-12-04 DIAGNOSIS — I2510 Atherosclerotic heart disease of native coronary artery without angina pectoris: Secondary | ICD-10-CM

## 2020-12-04 DIAGNOSIS — S02412D LeFort II fracture, subsequent encounter for fracture with routine healing: Secondary | ICD-10-CM

## 2020-12-04 NOTE — Patient Instructions (Signed)
Visit Information  PATIENT GOALS: Goals Addressed            This Visit's Progress   . Protect My Health       Timeframe:  Short-Term Goal Priority:  Medium Start Date:      12/04/20                       Expected End Date:        03/06/21               Follow Up Date 01/04/21    Protect My Health (Patient) Manage dental needs of client    Why is this important?    Screening tests can find diseases early when they are easier to treat.   Your doctor or nurse will talk with you about which tests are important for you.   Getting shots for common diseases like the flu and shingles will help prevent them.      Patient Strengths: Drives to needed appointments Takes medications as prescribed Researches dental recourses in the community Eats meals regularly Advocates for self and his needs  Patient Deficits Dental needs Difficulty chewing and swallowing Some pain issues  Patient Goals:  Attend scheduled medical appointments Talked with RNCM or LCSW as needed for support Take medications as prescribed Continue to research dental resources in the area for possible help with dental needs of client -  Follow Up Plan: LCSW to call client on 01/04/21 to assess client needs       [name] [name].Forrest MSW, LCSW Licensed Clinical Social Worker [facility] Care Management [phone]

## 2020-12-04 NOTE — Chronic Care Management (AMB) (Signed)
Chronic Care Management    Clinical Social Work Note  12/04/2020 Name: [name] [name]. MRN: [id number] DOB: [birth year]  [name] [name]. is a 75 y.o. year old male who is a primary care patient of Gwenlyn Fudge, FNP. The CCM team was consulted to assist the patient with chronic disease management and/or care coordination needs related to: [employer] .   Engaged with patient by telephone for follow up visit in response to provider referral for social work chronic care management and care coordination services.   Consent to Services:  The patient was given information about Chronic Care Management services, agreed to services, and gave verbal consent prior to initiation of services.  Please see initial visit note for detailed documentation.   Patient agreed to services and consent obtained.   Assessment: Review of patient past medical history, allergies, medications, and health status, including review of relevant consultants reports was performed today as part of a comprehensive evaluation and provision of chronic care management and care coordination services.     SDOH (Social Determinants of Health) assessments and interventions performed:    Advanced Directives Status: See Vynca application for related entries.  CCM Care Plan  Allergies  Allergen Reactions  . Lipitor [Atorvastatin] Other (See Comments)    "Makes pt feel funky"    Outpatient Encounter Medications as of 12/04/2020  Medication Sig  . amoxicillin-clavulanate (AUGMENTIN) 875-125 MG tablet Take 1 tablet by mouth 2 (two) times daily.  [name] aspirin 81 MG chewable tablet Chew 81 mg by mouth daily.  . cholecalciferol (VITAMIN D3) 25 MCG (1000 UNIT) tablet Take 1,000 Units by mouth daily.   . nitroGLYCERIN (NITROSTAT) 0.4 MG SL tablet Place 1 tablet (0.4 mg total) under the tongue every 5 (five) minutes as needed for chest pain.  [name] ondansetron (ZOFRAN-ODT) 8 MG disintegrating tablet Take 1 tablet (8 mg total)  by mouth every 8 (eight) hours as needed for nausea or vomiting. (Patient not taking: Reported on 11/02/2020)  . rosuvastatin (CRESTOR) 20 MG tablet Take 1 tablet (20 mg total) by mouth daily. (Patient not taking: Reported on 11/02/2020)  . tadalafil (CIALIS) 10 MG tablet Take 10 mg by mouth daily as needed.    No facility-administered encounter medications on file as of 12/04/2020.    Patient Active Problem List   Diagnosis Date Noted  . Dental abscess 11/02/2020  . [place] fracture [facility]) 03/22/2020  . Facial fracture (HCC) 03/13/2020  . HLD (hyperlipidemia) 11/16/2018  . CAD (coronary artery disease) 11/16/2018  . NSTEMI (non-ST elevated myocardial infarction) (HCC)   . Chest pain 11/14/2018  . Spondylolisthesis at L4-L5 level 06/08/2017  . Coronary artery disease involving native coronary artery of native heart without angina pectoris 05/08/2017  . Mixed hyperlipidemia 05/08/2017  . Erectile dysfunction 03/29/2013  . HYPERCHOLESTEROLEMIA  IIA 12/11/2008  . Coronary atherosclerosis 12/11/2008    Conditions to be addressed/monitored: ; Monitor pain issues facing client; monitor dental needs of client  Care Plan : LCSW Care Plan  Updates made by [name], LCSW since 12/04/2020 12:00 AM    Problem: Coping Skills (General Plan of Care)     Goal: Coping Skills Enhanced   Start Date: 12/04/2020  Expected End Date: 03/06/2021  This Visit's Progress: On track  Priority: Medium  Note:   Current barriers:   . Patient in need of assistance with connecting to community resources for dental care needs . Eating/Chewing Challenges . Pain issues . Financial Challenges  Clinical Goals:  patient will work with SW in next 30 days to address concerns related to dental needs of client Patient will communicate with SW in next 30 days to discuss ADLs completion for client  Clinical Interventions:  . Collaboration with Gwenlyn Fudge, FNP regarding development and update of  comprehensive plan of care as evidenced by provider attestation and co-signature . Assessment of needs, barriers  of client   . Talked with client about eating/chewing issues for client . Talked with client about pain issues of client . Talked with client about upcoming client medical appointments . Talked with client about feelings of numbness in his gums . Talked with client about financial challenges of client related to dental care needs  Patient Strengths: Drives to needed appointments Takes medications as prescribed Researches dental recourses in the community Eats meals regularly Advocates for self and his needs  Patient Deficits Dental needs Difficulty chewing and swallowing Some pain issues  Patient Goals:  Attend scheduled medical appointments Talked with RNCM or LCSW as needed for support Take medications as prescribed Continue to research dental resources in the area for possible help with dental needs of client -  Follow Up Plan: LCSW to call client on 01/04/21 to assess client needs      Kelton Pillar.Sebron Mcmahill MSW, LCSW Licensed Clinical Social Worker [facility] Care Management [phone]

## 2021-01-02 ENCOUNTER — Ambulatory Visit (INDEPENDENT_AMBULATORY_CARE_PROVIDER_SITE_OTHER): Payer: Medicare Other | Admitting: Family Medicine

## 2021-01-02 ENCOUNTER — Encounter: Payer: Self-pay | Admitting: Family Medicine

## 2021-01-02 ENCOUNTER — Other Ambulatory Visit: Payer: Self-pay

## 2021-01-02 VITALS — BP 136/75 | HR 57 | Temp 96.7°F | Ht 66.0 in | Wt 148.6 lb

## 2021-01-02 DIAGNOSIS — K1379 Other lesions of oral mucosa: Secondary | ICD-10-CM

## 2021-01-02 DIAGNOSIS — Z125 Encounter for screening for malignant neoplasm of prostate: Secondary | ICD-10-CM

## 2021-01-02 DIAGNOSIS — N529 Male erectile dysfunction, unspecified: Secondary | ICD-10-CM

## 2021-01-02 DIAGNOSIS — E782 Mixed hyperlipidemia: Secondary | ICD-10-CM | POA: Diagnosis not present

## 2021-01-02 DIAGNOSIS — E7800 Pure hypercholesterolemia, unspecified: Secondary | ICD-10-CM

## 2021-01-02 DIAGNOSIS — Z79899 Other long term (current) drug therapy: Secondary | ICD-10-CM

## 2021-01-02 DIAGNOSIS — I252 Old myocardial infarction: Secondary | ICD-10-CM

## 2021-01-02 DIAGNOSIS — S02412D LeFort II fracture, subsequent encounter for fracture with routine healing: Secondary | ICD-10-CM

## 2021-01-02 DIAGNOSIS — I2510 Atherosclerotic heart disease of native coronary artery without angina pectoris: Secondary | ICD-10-CM | POA: Diagnosis not present
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
## 2021-01-03 ENCOUNTER — Telehealth: Payer: Self-pay

## 2021-01-03 LAB — CMP14+EGFR
ALT: 25 IU/L (ref 0–44)
AST: 28 IU/L (ref 0–40)
Albumin/Globulin Ratio: 2 (ref 1.2–2.2)
Albumin: 4.5 g/dL (ref 3.7–4.7)
Alkaline Phosphatase: 70 IU/L (ref 44–121)
BUN/Creatinine Ratio: 23 (ref 10–24)
BUN: 20 mg/dL (ref 8–27)
Bilirubin Total: 0.5 mg/dL (ref 0.0–1.2)
CO2: 22 mmol/L (ref 20–29)
Calcium: 9.5 mg/dL (ref 8.6–10.2)
Chloride: 101 mmol/L (ref 96–106)
Creatinine, Ser: 0.88 mg/dL (ref 0.76–1.27)
Globulin, Total: 2.3 g/dL (ref 1.5–4.5)
Glucose: 86 mg/dL (ref 65–99)
Potassium: 4.4 mmol/L (ref 3.5–5.2)
Sodium: 137 mmol/L (ref 134–144)
Total Protein: 6.8 g/dL (ref 6.0–8.5)
eGFR: 90 mL/min/{1.73_m2} (ref >59)

## 2021-01-03 LAB — CBC WITH DIFFERENTIAL/PLATELET
Basophils Absolute: 0.1 10*3/uL (ref 0.0–0.2)
Basos: 1 %
EOS (ABSOLUTE): 0.2 10*3/uL (ref 0.0–0.4)
Eos: 2 %
Hematocrit: 49 % (ref 37.5–51.0)
Hemoglobin: 16.6 g/dL (ref 13.0–17.7)
Immature Grans (Abs): 0 10*3/uL (ref 0.0–0.1)
Immature Granulocytes: 0 %
Lymphocytes Absolute: 1.4 10*3/uL (ref 0.7–3.1)
Lymphs: 15 %
MCH: 31.1 pg (ref 26.6–33.0)
MCHC: 33.9 g/dL (ref 31.5–35.7)
MCV: 92 fL (ref 79–97)
Monocytes Absolute: 0.7 10*3/uL (ref 0.1–0.9)
Monocytes: 8 %
Neutrophils Absolute: 7.1 10*3/uL — ABNORMAL HIGH (ref 1.4–7.0)
Neutrophils: 74 %
Platelets: 211 10*3/uL (ref 150–450)
RBC: 5.34 x10E6/uL (ref 4.14–5.80)
RDW: 12.4 % (ref 11.6–15.4)
WBC: 9.5 10*3/uL (ref 3.4–10.8)

## 2021-01-03 LAB — LIPID PANEL
Chol/HDL Ratio: 4.1 ratio (ref 0.0–5.0)
Cholesterol, Total: 158 mg/dL (ref 100–199)
HDL: 39 mg/dL — ABNORMAL LOW (ref >39)
LDL Chol Calc (NIH): 103 mg/dL — ABNORMAL HIGH (ref 0–99)
Triglycerides: 83 mg/dL (ref 0–149)
VLDL Cholesterol Cal: 16 mg/dL (ref 5–40)

## 2021-01-03 LAB — PSA, TOTAL AND FREE
PSA, Free Pct: 25.7 %
PSA, Free: 0.18 ng/mL
Prostate Specific Ag, Serum: 0.7 ng/mL (ref 0.0–4.0)

## 2021-01-03 LAB — TESTOSTERONE,FREE AND TOTAL
Testosterone, Free: 10.9 pg/mL (ref 6.6–18.1)
Testosterone: 941 ng/dL — ABNORMAL HIGH (ref 264–916)

## 2021-01-03 LAB — TSH: TSH: 0.704 u[IU]/mL (ref 0.450–4.500)

## 2021-01-04 ENCOUNTER — Ambulatory Visit: Payer: Medicare Other | Admitting: Licensed Clinical Social Worker

## 2021-01-04 DIAGNOSIS — K047 Periapical abscess without sinus: Secondary | ICD-10-CM

## 2021-01-04 DIAGNOSIS — S02412D LeFort II fracture, subsequent encounter for fracture with routine healing: Secondary | ICD-10-CM

## 2021-01-04 DIAGNOSIS — K1379 Other lesions of oral mucosa: Secondary | ICD-10-CM

## 2021-01-04 DIAGNOSIS — E785 Hyperlipidemia, unspecified: Secondary | ICD-10-CM

## 2021-01-04 DIAGNOSIS — I2510 Atherosclerotic heart disease of native coronary artery without angina pectoris: Secondary | ICD-10-CM

## 2021-01-04 DIAGNOSIS — N529 Male erectile dysfunction, unspecified: Secondary | ICD-10-CM

## 2021-01-04 MED ORDER — HYDROCODONE-ACETAMINOPHEN 7.5-325 MG PO TABS: 1.0000 | ORAL_TABLET | Freq: Every day | ORAL | 0 refills | Status: DC | PRN

## 2021-01-04 MED ORDER — TADALAFIL 10 MG PO TABS: 10.0000 mg | ORAL_TABLET | Freq: Every day | ORAL | 2 refills | Status: DC | PRN

## 2021-01-04 NOTE — Patient Instructions (Signed)
Visit Information  PATIENT GOALS: Goals Addressed            This Visit's Progress   . Protect My Health: Manage dental care needs       Timeframe:  Short-Term Goal Priority:  Medium Start Date:      12/04/20                       Expected End Date:        03/06/21               Follow Up Date 02/12/21    Protect My Health (Patient) Manage dental needs of client    Why is this important?    Screening tests can find diseases early when they are easier to treat.   Your doctor or nurse will talk with you about which tests are important for you.   Getting shots for common diseases like the flu and shingles will help prevent them.      Patient Strengths: Drives to needed appointments Takes medications as prescribed Researches dental recourses in the community Eats meals regularly Advocates for self and his needs  Patient Deficits Dental needs Difficulty chewing and swallowing Some pain issues  Patient Goals:  Attend scheduled medical appointments Talked with RNCM or LCSW as needed for support Take medications as prescribed Continue to research dental resources in the area for possible help with dental needs of client -  Follow Up Plan: LCSW to call client on 02/12/21 to assess client needs        Kelton Pillar.Hooria Gasparini MSW, LCSW Licensed Clinical Social Worker [facility] Care Management [phone]

## 2021-01-04 NOTE — Telephone Encounter (Signed)
Patient aware and verbalizes understanding. 

## 2021-01-04 NOTE — Chronic Care Management (AMB) (Signed)
Chronic Care Management    Clinical Social Work Note  01/04/2021 Name: [name] [name]. MRN: [id number] DOB: [birth year]  [name] is a 75 y.o. year old male who is a primary care patient of Gwenlyn Fudge, FNP. The CCM team was consulted to assist the patient with chronic disease management and/or care coordination needs related to: [employer] .   Engaged with patient by telephone for follow up visit in response to provider referral for social work chronic care management and care coordination services.   Consent to Services:  The patient was given information about Chronic Care Management services, agreed to services, and gave verbal consent prior to initiation of services.  Please see initial visit note for detailed documentation.   Patient agreed to services and consent obtained.   Assessment: Review of patient past medical history, allergies, medications, and health status, including review of relevant consultants reports was performed today as part of a comprehensive evaluation and provision of chronic care management and care coordination services.     SDOH (Social Determinants of Health) assessments and interventions performed:  SDOH Interventions   Flowsheet Row Most Recent Value  SDOH Interventions   Depression Interventions/Treatment  --  [informed client of LCSW support and RNCM support]       Advanced Directives Status: See Vynca application for related entries.  CCM Care Plan  Allergies  Allergen Reactions  . Lipitor [Atorvastatin] Other (See Comments)    "Makes pt feel funky"    Outpatient Encounter Medications as of 01/04/2021  Medication Sig  . aspirin 81 MG chewable tablet Chew 81 mg by mouth daily.  . cholecalciferol (VITAMIN D3) 25 MCG (1000 UNIT) tablet Take 1,000 Units by mouth daily.   [name] HYDROcodone-acetaminophen (NORCO) 7.5-325 MG tablet Take 1 tablet by mouth daily as needed for moderate pain.  . nitroGLYCERIN (NITROSTAT) 0.4 MG  SL tablet Place 1 tablet (0.4 mg total) under the tongue every 5 (five) minutes as needed for chest pain.  . tadalafil (CIALIS) 10 MG tablet Take 1-2 tablets (10-20 mg total) by mouth daily as needed.  . [DISCONTINUED] HYDROcodone-acetaminophen (NORCO) 7.5-325 MG tablet Take 1 tablet by mouth every 6 (six) hours as needed for moderate pain.  . [DISCONTINUED] tadalafil (CIALIS) 10 MG tablet Take 10 mg by mouth daily as needed.    No facility-administered encounter medications on file as of 01/04/2021.    Patient Active Problem List   Diagnosis Date Noted  . Dental abscess 11/02/2020  . [place] fracture [facility]) 03/22/2020  . Facial fracture (HCC) 03/13/2020  . HLD (hyperlipidemia) 11/16/2018  . CAD (coronary artery disease) 11/16/2018  . NSTEMI (non-ST elevated myocardial infarction) (HCC)   . Chest pain 11/14/2018  . Spondylolisthesis at L4-L5 level 06/08/2017  . Coronary artery disease involving native coronary artery of native heart without angina pectoris 05/08/2017  . Mixed hyperlipidemia 05/08/2017  . Erectile dysfunction 03/29/2013  . HYPERCHOLESTEROLEMIA  IIA 12/11/2008  . Coronary atherosclerosis 12/11/2008    Conditions to be addressed/monitored: Monitor client management of dental needs   Care Plan : LCSW Care Plan  Updates made by [name], LCSW since 01/04/2021 12:00 AM    Problem: Coping Skills (General Plan of Care)     Goal: Coping Skills Enhanced: manage dental needs of client; manage daily needs of client   Start Date: 12/04/2020  Expected End Date: 03/06/2021  This Visit's Progress: On track  Recent Progress: On track  Priority: Medium  Note:  Current barriers:   . Patient in need of assistance with connecting to community resources for dental care needs . Eating/Chewing Challenges . Pain issues . Financial Challenges  Clinical Goals:  patient will work with SW in next 30 days to address concerns related to dental needs of client Patient will  communicate with SW in next 30 days to discuss ADLs completion for client  Clinical Interventions:  . Collaboration with Gwenlyn Fudge, FNP regarding development and update of comprehensive plan of care as evidenced by provider attestation and co-signature . Assessment of needs, barriers  of client   . Talked with client about eating/chewing issues for client . Talked with client about pain issues of client . Talked with client about upcoming client medical appointments . Talked with client about feelings of numbness in his gums . Talked with client about financial challenges of client related to dental care needs . Encouraged client to call RNCM as needed for CCM nursing support  Patient Strengths: Drives to needed appointments Takes medications as prescribed Researches dental recourses in the community Eats meals regularly Advocates for self and his needs  Patient Deficits Dental needs Difficulty chewing and swallowing Some pain issues  Patient Goals:  Attend scheduled medical appointments Talked with RNCM or LCSW as needed for support Take medications as prescribed Continue to research dental resources in the area for possible help with dental needs of client -  Follow Up Plan: LCSW to call client on 02/12/21 to assess client needs     Kelton Pillar.Zeb Rawl MSW, LCSW Licensed Clinical Social Worker [facility] Care Management [phone]

## 2021-01-04 NOTE — Telephone Encounter (Signed)
Norco and Cialis have been refilled. Robitussin is OTC, but we did not discuss it either.

## 2021-01-04 NOTE — Telephone Encounter (Signed)
Calling back on message left 4-21. Was seen Wednesday with Britney and medication has never been called in.

## 2021-01-07 DIAGNOSIS — Z79899 Other long term (current) drug therapy: Secondary | ICD-10-CM | POA: Insufficient documentation

## 2021-02-04 DIAGNOSIS — Z029 Encounter for administrative examinations, unspecified: Secondary | ICD-10-CM

## 2021-02-07 ENCOUNTER — Ambulatory Visit (INDEPENDENT_AMBULATORY_CARE_PROVIDER_SITE_OTHER): Payer: Medicare Other

## 2021-02-07 VITALS — Ht 66.0 in | Wt 149.0 lb

## 2021-02-07 DIAGNOSIS — Z0000 Encounter for general adult medical examination without abnormal findings: Secondary | ICD-10-CM | POA: Diagnosis not present

## 2021-02-07 NOTE — Patient Instructions (Signed)
[name] [name] , Thank you for taking time to come for your Medicare Wellness Visit. I appreciate your ongoing commitment to your health goals. Please review the following plan we discussed and let me know if I can assist you in the future.   Screening recommendations/referrals: Colonoscopy: Declines Recommended yearly ophthalmology/optometry visit for glaucoma screening and checkup Recommended yearly dental visit for hygiene and checkup  Vaccinations: Influenza vaccine: Declines Pneumococcal vaccine: Pneumovax done 03/29/2013, declines Prevnar Tdap vaccine: Declines Shingles vaccine: Declines   Covid-19: Declines  Advanced directives: In chart  Conditions/risks identified: Aim for 30 minutes of exercise or brisk walking each day, drink 6-8 glasses of water and eat lots of fruits and vegetables.  Next appointment: Follow up in one year for your annual wellness visit.   Preventive Care 75 Years and Older, Male  Preventive care refers to lifestyle choices and visits with your health care provider that can promote health and wellness. What does preventive care include?  A yearly physical exam. This is also called an annual well check.  Dental exams once or twice a year.  Routine eye exams. Ask your health care provider how often you should have your eyes checked.  Personal lifestyle choices, including:  Daily care of your teeth and gums.  Regular physical activity.  Eating a healthy diet.  Avoiding tobacco and drug use.  Limiting alcohol use.  Practicing safe sex.  Taking low doses of aspirin every day.  Taking vitamin and mineral supplements as recommended by your health care provider. What happens during an annual well check? The services and screenings done by your health care provider during your annual well check will depend on your age, overall health, lifestyle risk factors, and family history of disease. Counseling  Your health care provider may ask you questions  about your:  Alcohol use.  Tobacco use.  Drug use.  Emotional well-being.  Home and relationship well-being.  Sexual activity.  Eating habits.  History of falls.  Memory and ability to understand (cognition).  Work and work Statistician. Screening  You may have the following tests or measurements:  Height, weight, and BMI.  Blood pressure.  Lipid and cholesterol levels. These may be checked every 5 years, or more frequently if you are over 21 years old.  Skin check.  Lung cancer screening. You may have this screening every year starting at age 85 if you have a 30-pack-year history of smoking and currently smoke or have quit within the past 15 years.  Fecal occult blood test (FOBT) of the stool. You may have this test every year starting at age 71.  Flexible sigmoidoscopy or colonoscopy. You may have a sigmoidoscopy every 5 years or a colonoscopy every 10 years starting at age 90.  Prostate cancer screening. Recommendations will vary depending on your family history and other risks.  Hepatitis C blood test.  Hepatitis B blood test.  Sexually transmitted disease (STD) testing.  Diabetes screening. This is done by checking your blood sugar (glucose) after you have not eaten for a while (fasting). You may have this done every 1-3 years.  Abdominal aortic aneurysm (AAA) screening. You may need this if you are a current or former smoker.  Osteoporosis. You may be screened starting at age 25 if you are at high risk. Talk with your health care provider about your test results, treatment options, and if necessary, the need for more tests. Vaccines  Your health care provider may recommend certain vaccines, such as:  Influenza vaccine. This  is recommended every year.  Tetanus, diphtheria, and acellular pertussis (Tdap, Td) vaccine. You may need a Td booster every 10 years.  Zoster vaccine. You may need this after age 23.  Pneumococcal 13-valent conjugate (PCV13)  vaccine. One dose is recommended after age 79.  Pneumococcal polysaccharide (PPSV23) vaccine. One dose is recommended after age 48. Talk to your health care provider about which screenings and vaccines you need and how often you need them. This information is not intended to replace advice given to you by your health care provider. Make sure you discuss any questions you have with your health care provider. Document Released: 09/28/2015 Document Revised: 05/21/2016 Document Reviewed: 07/03/2015 Elsevier Interactive Patient Education  2017 [place] Prevention in the Home Falls can cause injuries. They can happen to people of all ages. There are many things you can do to make your home safe and to help prevent falls. What can I do on the outside of my home?  Regularly fix the edges of walkways and driveways and fix any cracks.  Remove anything that might make you trip as you walk through a door, such as a raised step or threshold.  Trim any bushes or trees on the path to your home.  Use bright outdoor lighting.  Clear any walking paths of anything that might make someone trip, such as rocks or tools.  Regularly check to see if handrails are loose or broken. Make sure that both sides of any steps have handrails.  Any raised decks and porches should have guardrails on the edges.  Have any leaves, snow, or ice cleared regularly.  Use sand or salt on walking paths during winter.  Clean up any spills in your garage right away. This includes oil or grease spills. What can I do in the bathroom?  Use night lights.  Install grab bars by the toilet and in the tub and shower. Do not use towel bars as grab bars.  Use non-skid mats or decals in the tub or shower.  If you need to sit down in the shower, use a plastic, non-slip stool.  Keep the floor dry. Clean up any water that spills on the floor as soon as it happens.  Remove soap buildup in the tub or shower  regularly.  Attach bath mats securely with double-sided non-slip rug tape.  Do not have throw rugs and other things on the floor that can make you trip. What can I do in the bedroom?  Use night lights.  Make sure that you have a light by your bed that is easy to reach.  Do not use any sheets or blankets that are too big for your bed. They should not hang down onto the floor.  Have a firm chair that has side arms. You can use this for support while you get dressed.  Do not have throw rugs and other things on the floor that can make you trip. What can I do in the kitchen?  Clean up any spills right away.  Avoid walking on wet floors.  Keep items that you use a lot in easy-to-reach places.  If you need to reach something above you, use a strong step stool that has a grab bar.  Keep electrical cords out of the way.  Do not use floor polish or wax that makes floors slippery. If you must use wax, use non-skid floor wax.  Do not have throw rugs and other things on the floor that can make you  trip. What can I do with my stairs?  Do not leave any items on the stairs.  Make sure that there are handrails on both sides of the stairs and use them. Fix handrails that are broken or loose. Make sure that handrails are as long as the stairways.  Check any carpeting to make sure that it is firmly attached to the stairs. Fix any carpet that is loose or worn.  Avoid having throw rugs at the top or bottom of the stairs. If you do have throw rugs, attach them to the floor with carpet tape.  Make sure that you have a light switch at the top of the stairs and the bottom of the stairs. If you do not have them, ask someone to add them for you. What else can I do to help prevent falls?  Wear shoes that:  Do not have high heels.  Have rubber bottoms.  Are comfortable and fit you well.  Are closed at the toe. Do not wear sandals.  If you use a stepladder:  Make sure that it is fully  opened. Do not climb a closed stepladder.  Make sure that both sides of the stepladder are locked into place.  Ask someone to hold it for you, if possible.  Clearly mark and make sure that you can see:  Any grab bars or handrails.  First and last steps.  Where the edge of each step is.  Use tools that help you move around (mobility aids) if they are needed. These include:  Canes.  Walkers.  Scooters.  Crutches.  Turn on the lights when you go into a dark area. Replace any light bulbs as soon as they burn out.  Set up your furniture so you have a clear path. Avoid moving your furniture around.  If any of your floors are uneven, fix them.  If there are any pets around you, be aware of where they are.  Review your medicines with your doctor. Some medicines can make you feel dizzy. This can increase your chance of falling. Ask your doctor what other things that you can do to help prevent falls. This information is not intended to replace advice given to you by your health care provider. Make sure you discuss any questions you have with your health care provider. Document Released: 06/28/2009 Document Revised: 02/07/2016 Document Reviewed: 10/06/2014 Elsevier Interactive Patient Education  2017 Elsevier Inc[employer]  Managing Pain Without Opioids Opioids are strong medicines used to treat moderate to severe pain. For some people, especially those who have long-term (chronic) pain, opioids may not be the best choice for pain management due to:  Side effects like nausea, constipation, and sleepiness.  The risk of addiction (opioid use disorder). The longer you take opioids, the greater your risk of addiction. Pain that lasts for more than 3 months is called chronic pain. Managing chronic pain usually requires more than one approach and is often provided by a team of health care providers working together (multidisciplinary approach). Pain management may be done at a pain management  center or pain clinic. Types of pain management without opioids Managing pain without opioids can involve:  Non-opioid medicines.  Exercises to help relieve pain and improve strength and range of motion (physical therapy).  Therapy to help with everyday tasks and activities (occupational therapy).  Therapy to help you find ways to relieve pain by doing things you enjoy (recreational therapy).  Talk therapy (psychotherapy) and other mental health therapies.  Medical treatments such  as injections or devices.  Making lifestyle changes. Pain management options Non-opioid medicines Non-opioid medicines for pain may include medicines taken by mouth (oral medicines), such as:  Over-the-counter or prescription NSAIDs. These may be the first medicines used for pain. They work well for muscle and bone pain, and they reduce swelling.  Acetaminophen. This over-the-counter medicine may work well for milder pain but not swelling.  Antidepressants. These may be used to treat chronic pain. A certain type of antidepressant (tricyclics) is often used. These medicines are given in lower doses for pain than when used for depression.  Anticonvulsants. These are usually used to treat seizures but may also reduce nerve (neuropathic) pain.  Muscle relaxants. These relieve pain caused by sudden muscle tightening (spasms). You may also use a type of pain medicine that is applied to the skin as a patch, cream, or gel (topical analgesic), such as a numbing medicine. These may cause fewer side effects than oral medicines. Therapy Physical therapy involves doing exercises to gain strength and flexibility. A physical therapist may teach you exercises to move and stretch parts of your body that are weak, stiff, or painful. You can learn these exercises at physical therapy visits and practice them at home. Physical therapy may also involve:  Massage.  Heat wraps or applying heat or cold to affected  areas.  Sending electrical signals through the skin to interrupt pain signals (transcutaneous electrical nerve stimulation, TENS).  Sending weak lasers through the skin to reduce pain and swelling (low-level laser therapy).  Using signals from your body to help you learn to regulate pain (biofeedback). Occupational therapy helps you learn ways to function at home and work with less pain. Recreational therapy may involve trying new activities or hobbies, such as drawing or a physical activity. Types of mental health therapy for pain include:  Cognitive behavioral therapy (CBT) to help you learn coping skills for dealing with pain.  Acceptance and commitment therapy (ACT) to change the way you think and react to pain.  Relaxation therapies, including muscle relaxation exercises and focusing your mind on the present moment to lower stress (mindfulness-based stress reduction).  Pain management counseling. This may be individual, family, or group counseling.   Medical treatments Medical treatments for pain management include:  Nerve block injections. These may include a pain blocker and anti-inflammatory medicines. You may have injections: ? Near the spine to relieve chronic back or neck pain. ? Into joints to relieve back or joint pain. ? Into nerve areas that supply a painful area to relieve body pain. ? Into muscles (trigger point injections) to relieve some painful muscle conditions.  A medical device placed near your spine to help block pain signals and relieve nerve pain or chronic back pain (spinal cord stimulation device).  Acupuncture. Follow these instructions at home Medicines  Take over-the-counter and prescription medicines only as told by your health care provider.  If you are taking pain medicine, ask your health care providers about possible side effects to watch out for.  Do not drive or use heavy machinery while taking prescription pain medicine. Lifestyle  Do not  use drugs or alcohol to reduce pain. Limit alcohol intake to no more than 1 drink a day for nonpregnant women and 2 drinks a day for men. One drink equals 12 oz of beer, 5 oz of wine, or 1 oz of hard liquor.  Do not use any products that contain nicotine or tobacco, such as cigarettes and e-cigarettes. These can delay healing.  If you need help quitting, ask your health care provider.  Eat a healthy diet and maintain a healthy weight. Poor diet and excess weight may make pain worse. ? Eat foods that are high in fiber. These include fresh fruits and vegetables, whole grains, and beans. ? Limit foods that are high in fat and processed sugars, such as fried and sweet foods.  Exercise regularly. Exercise lowers stress and may help relieve pain. ? Ask your health care provider what activities and exercises are safe for you. ? If your health care provider approves, join an exercise class that combines movement and stress reduction. Examples include yoga and tai chi.  Get enough sleep. Lack of sleep may make pain worse.  Lower stress as much as possible. Practice stress reduction techniques as told by your therapist.   General instructions  Work with all your pain management providers to find the treatments that work best for you. You are an important member of your pain management team. There are many things you can do to reduce pain on your own.  Consider joining an online or in-person support group for people who have chronic pain.  Keep all follow-up visits as told by your health care providers. This is important. Where to find more information You can find more information about managing pain without opioids from:  American Academy of Pain Medicine: painmed.[place] for Chronic Pain: instituteforchronicpain.org  American Chronic Pain Association: theacpa.org Contact a health care provider if:  You have side effects from pain medicine.  Your pain gets worse or does not get better  with treatments or home care.  You are struggling with anxiety or depression. Summary  Many types of pain can be managed without opioids. Chronic pain may respond better to pain management without opioids.  Pain is best managed with a team of providers working together.  Pain management without opioids may include non-opioid medicines, medical treatments, physical therapy, mental health therapy, and lifestyle changes.  Tell your health care providers if your pain gets worse or is not being managed well enough. This information is not intended to replace advice given to you by your health care provider. Make sure you discuss any questions you have with your health care provider. Document Revised: 06/14/2020 Document Reviewed: 06/14/2020 Elsevier Patient Education  [place].

## 2021-02-07 NOTE — Progress Notes (Signed)
Subjective:   [name] [name]. is a 75 y.o. male who presents for an Initial Medicare Annual Wellness Visit.  Virtual Visit via Telephone Note  I connected with  [name] [name]. on 02/07/21 at  8:15 AM EDT by telephone and verified that I am speaking with the correct person using two identifiers.  Location: Patient: Home Provider: [facility] Persons participating in the virtual visit: patient/Nurse Health Advisor   I discussed the limitations, risks, security and privacy concerns of performing an evaluation and management service by telephone and the availability of in person appointments. The patient expressed understanding and agreed to proceed.  Interactive audio and video telecommunications were attempted between this nurse and patient, however failed, due to patient having technical difficulties OR patient did not have access to video capability.  We continued and completed visit with audio only.  Some vital signs may be absent or patient reported.   Li Bobo E Shaima Sardinas, LPN   Review of Systems     Cardiac Risk Factors include: advanced age (>67men, >40 women);male gender;dyslipidemia;hypertension;Other (see comment), Risk factor comments: hx of MI     Objective:    Today's Vitals   02/07/21 0828  Weight: 149 lb (67.6 kg)  Height:  (1.676 m)  PainSc: 4    Body mass index is 24.05 kg/m.  Advanced Directives 04/23/2020 04/17/2020 04/03/2020 03/13/2020 03/13/2020 11/14/2018 06/03/2017  Does Patient Have a Medical Advance Directive? Yes Yes Yes No No No Yes  Type of Advance Directive Living will Living will Healthcare Power of [place];Living will - - - Public affairs consultant of [place];Living will  Does patient want to make changes to medical advance directive? - No - Patient declined No - Patient declined - - - -  Copy of Healthcare Power of Attorney in Chart? Yes - validated most recent copy scanned in chart (See row information) - Yes - validated most recent copy scanned in chart  (See row information) - - - No - copy requested  Would patient like information on creating a medical advance directive? - - - Yes (Inpatient - patient requests chaplain consult to create a medical advance directive) - No - Patient declined -    Current Medications (verified) Outpatient Encounter Medications as of 02/07/2021  Medication Sig  . aspirin 81 MG chewable tablet Chew 81 mg by mouth daily.  . cholecalciferol (VITAMIN D3) 25 MCG (1000 UNIT) tablet Take 1,000 Units by mouth daily.   [name] HYDROcodone-acetaminophen (NORCO) 7.5-325 MG tablet Take 1 tablet by mouth daily as needed for moderate pain.  . rosuvastatin (CRESTOR) 20 MG tablet Take 20 mg by mouth daily.  . tadalafil (CIALIS) 10 MG tablet Take 1-2 tablets (10-20 mg total) by mouth daily as needed.  . nitroGLYCERIN (NITROSTAT) 0.4 MG SL tablet Place 1 tablet (0.4 mg total) under the tongue every 5 (five) minutes as needed for chest pain.   No facility-administered encounter medications on file as of 02/07/2021.    Allergies (verified) Lipitor [atorvastatin]   History: Past Medical History:  Diagnosis Date  . Coronary artery disease   . Erectile dysfunction   . History of hiatal hernia    a lone time ago  . Hyperlipidemia   . [place] fracture [facility]) 03/22/2020  . Myocardial infarction (HCC)    x 2  . Polio    Past Surgical History:  Procedure Laterality Date  . APPENDECTOMY    . CARDIAC CATHETERIZATION     has two stents placed  . CERVICAL  SPINE SURGERY     c3-4, 4-5,  5-6, 8 screws and plates  . CORONARY STENT PLACEMENT    . forein body removal     metal sharp removed from right leg  . LEFT HEART CATH AND CORONARY ANGIOGRAPHY N/A 11/15/2018   Procedure: LEFT HEART CATH AND CORONARY ANGIOGRAPHY;  Surgeon: Yvonne Kendall, MD;  Location: [facility];  Service: Cardiovascular;  Laterality: N/A;  . MANDIBULAR HARDWARE REMOVAL Bilateral 04/23/2020   Procedure: MANDIBULAR HARDWARE REMOVAL;  Surgeon: Serena Colonel, MD;   Location: [facility];  Service: ENT;  Laterality: Bilateral;  . ORIF MANDIBULAR FRACTURE N/A 03/14/2020   Procedure: OPEN REDUCTION INTERNAL FIXATION (ORIF) MID  FACE FRACTURE, MANDIBULAR FIXATION MODIFIED ARCH BARS;  Surgeon: Serena Colonel, MD;  Location: [facility];  Service: ENT;  Laterality: N/A;  . SCAR REVISION N/A 04/23/2020   Procedure: SCAR REVISION/ Upper Lip Repair;  Surgeon: Serena Colonel, MD;  Location: [facility];  Service: ENT;  Laterality: N/A;   Family History  Problem Relation Age of Onset  . Diabetes Mother   . Heart disease Mother   . Heart disease Father    Social History   Socioeconomic History  . Marital status: Divorced    Spouse name: Not on file  . Number of children: Not on file  . Years of education: Not on file  . Highest education level: Not on file  Occupational History  . Not on file  Tobacco Use  . Smoking status: Former Games developer  . Smokeless tobacco: Never Used  Vaping Use  . Vaping Use: Never used  Substance and Sexual Activity  . Alcohol use: Yes    Alcohol/week: 21.0 standard drinks    Types: 21 Cans of beer per week    Comment: 2 beers daily  . Drug use: No  . Sexual activity: Not on file  Other Topics Concern  . Not on file  Social History Narrative   Mr. Lucarelli has a restraining order against one of his sons.   Social Determinants of Health   Financial Resource Strain: Low Risk   . Difficulty of Paying Living Expenses: Not very hard  Food Insecurity: No Food Insecurity  . Worried About Programme researcher, broadcasting/film/video in the Last Year: Never true  . Ran Out of Food in the Last Year: Never true  Transportation Needs: No Transportation Needs  . Lack of Transportation (Medical): No  . Lack of Transportation (Non-Medical): No  Physical Activity: Sufficiently Active  . Days of Exercise per Week: 7 days  . Minutes of Exercise per Session: 40 min  Stress: Stress Concern Present  . Feeling of Stress : To some extent  Social  Connections: Moderately Integrated  . Frequency of Communication with Friends and Family: More than three times a week  . Frequency of Social Gatherings with Friends and Family: More than three times a week  . Attends Religious Services: More than 4 times per year  . Active Member of Clubs or Organizations: Yes  . Attends Banker Meetings: More than 4 times per year  . Marital Status: Divorced    Tobacco Counseling Counseling given: Not Answered   Clinical Intake:  Pre-visit preparation completed: Yes  Pain : 0-10 Pain Score: 4  Pain Type: Chronic pain Pain Location: Teeth Pain Orientation: Anterior Pain Descriptors / Indicators: Aching,Sore Pain Onset: More than a month ago Pain Frequency: Intermittent     BMI - recorded: 24.05 Nutritional Status: BMI of  19-24  Normal Nutritional Risks: None Diabetes: No  How often do you need to have someone help you when you read instructions, pamphlets, or other written materials from your doctor or pharmacy?: 1 - Never  Diabetic? No  Interpreter Needed?: No  Information entered by :: [name] Giza, LPN   Activities of Daily Living In your present state of health, do you have any difficulty performing the following activities: 02/07/2021 04/03/2020  Hearing? N N  Vision? N N  Difficulty concentrating or making decisions? Y N  Walking or climbing stairs? Y N  Dressing or bathing? N N  Doing errands, shopping? N N  Preparing Food and eating ? N Y  Comment - Mouth wired shut. Late entry for 03/26/20. [facility]  Using the Toilet? N -  In the past six months, have you accidently leaked urine? N -  Do you have problems with loss of bowel control? N -  Managing your Medications? N -  Managing your Finances? N -  Housekeeping or managing your Housekeeping? N -  Some recent data might be hidden    Patient Care Team: Gwenlyn Fudge, FNP as PCP - General (Family Medicine) Nahser, Deloris Ping, MD as PCP - Cardiology  (Cardiology) Randa Spike Kelton Pillar, LCSW as Social Worker (Licensed Clinical Social Worker) Gwenith Daily, RN as Case Manager  Indicate any recent Medical Services you may have received from other than [facility] providers in the past year (date may be approximate).     Assessment:   This is a routine wellness examination for [name] [name].  Hearing/Vision screen  Hearing Screening   125Hz  250Hz  500Hz  1000Hz  2000Hz  3000Hz  4000Hz  6000Hz  8000Hz   Right ear:           Left ear:           Comments: Denies hearing difficulties  Vision Screening Comments: Wears eyeglasses - Routine visits with [facility] in - up to date with eye exams  Dietary issues and exercise activities discussed: Current Exercise Habits: Home exercise routine, Type of exercise: walking, Time (Minutes): 45, Frequency (Times/Week): 7, Weekly Exercise (Minutes/Week): 315, Intensity: Mild, Exercise limited by: orthopedic condition(s)  Goals Addressed            This Visit's Progress   . Protect My Health: Manage dental care needs   On track    Timeframe:  Short-Term Goal Priority:  Medium Start Date:      12/04/20                       Expected End Date:        03/06/21               Follow Up Date 02/12/21    Protect My Health (Patient) Manage dental needs of client    Why is this important?    Screening tests can find diseases early when they are easier to treat.   Your doctor or nurse will talk with you about which tests are important for you.   Getting shots for common diseases like the flu and shingles will help prevent them.      Patient Strengths: Drives to needed appointments Takes medications as prescribed Researches dental recourses in the community Eats meals regularly Advocates for self and his needs  Patient Deficits Dental needs Difficulty chewing and swallowing Some pain issues  Patient Goals:  Attend scheduled medical appointments Talked with RNCM or LCSW as needed for support Take  medications as prescribed Continue  to research dental resources in the area for possible help with dental needs of client -  Follow Up Plan: LCSW to call client on 02/12/21 to assess client needs       Depression Screen PHQ 2/9 Scores 02/07/2021 01/04/2021 01/02/2021 11/02/2020 04/04/2020 03/27/2020 12/30/2017  PHQ - 2 Score 1 0 0 0 2 0 0  PHQ- 9 Score 4 2 - - 5 - -    Fall Risk Fall Risk  02/07/2021 01/02/2021 11/02/2020 11/02/2020 04/03/2020  Falls in the past year? 1 0 1 0 1  Comment - - - - -  Number falls in past yr: 0 - 1 - -  Injury with Fall? 0 - 0 - -  Risk for fall due to : Impaired balance/gait;History of fall(s);Orthopedic patient;Impaired vision;Medication side effect - History of fall(s) - -  Follow up Education provided;Falls prevention discussed - Falls evaluation completed - -    FALL RISK PREVENTION PERTAINING TO THE HOME:  Any stairs in or around the home? Yes  If so, are there any without handrails? No  Home free of loose throw rugs in walkways, pet beds, electrical cords, etc? Yes  Adequate lighting in your home to reduce risk of falls? Yes   ASSISTIVE DEVICES UTILIZED TO PREVENT FALLS:  Life alert? No  Use of a cane, walker or w/c? No  Grab bars in the bathroom? Yes  Shower chair or bench in shower? No  Elevated toilet seat or a handicapped toilet? No   TIMED UP AND GO:  Was the test performed? No . Telephonic visit  Cognitive Function:     6CIT Screen 02/07/2021  What Year? 0 points  What month? 0 points  What time? 0 points  Count back from 20 0 points  Months in reverse 0 points  Repeat phrase 0 points  Total Score 0    Immunizations Immunization History  Administered Date(s) Administered  . Pneumococcal Polysaccharide-23 03/29/2013    TDAP status: Due, Education has been provided regarding the importance of this vaccine. Advised may receive this vaccine at local pharmacy or Health Dept. Aware to provide a copy of the vaccination record if  obtained from local pharmacy or Health Dept. Verbalized acceptance and understanding.  Flu Vaccine status: Declined, Education has been provided regarding the importance of this vaccine but patient still declined. Advised may receive this vaccine at local pharmacy or Health Dept. Aware to provide a copy of the vaccination record if obtained from local pharmacy or Health Dept. Verbalized acceptance and understanding.  Pneumococcal vaccine status: Declined,  Education has been provided regarding the importance of this vaccine but patient still declined. Advised may receive this vaccine at local pharmacy or Health Dept. Aware to provide a copy of the vaccination record if obtained from local pharmacy or Health Dept. Verbalized acceptance and understanding.   Covid-19 vaccine status: Declined, Education has been provided regarding the importance of this vaccine but patient still declined. Advised may receive this vaccine at local pharmacy or Health Dept.or vaccine clinic. Aware to provide a copy of the vaccination record if obtained from local pharmacy or Health Dept. Verbalized acceptance and understanding.  Qualifies for Shingles Vaccine? Yes   Zostavax completed No   Shingrix Completed?: No.    Education has been provided regarding the importance of this vaccine. Patient has been advised to call insurance company to determine out of pocket expense if they have not yet received this vaccine. Advised may also receive vaccine at local pharmacy or Health  Dept. Verbalized acceptance and understanding.  Screening Tests Health Maintenance  Topic Date Due  . COVID-19 Vaccine (1) Never done  . COLONOSCOPY (Pts [id number] Insurance coverage will need to be confirmed)  01/02/2022 (Originally 01/09/1991)  . TETANUS/TDAP  01/02/2022 (Originally 01/08/1965)  . Hepatitis C Screening  01/02/2022 (Originally [birth year]09/1964)  . PNA vac Low Risk Adult (2 of 2 - PCV13) 01/02/2022 (Originally 03/29/2014)  . INFLUENZA VACCINE   04/15/2021  . HPV VACCINES  Aged Out    Health Maintenance  Health Maintenance Due  Topic Date Due  . COVID-19 Vaccine (1) Never done    Colorectal cancer screening: No longer required.   Lung Cancer Screening: (Low Dose CT Chest recommended if Age 1-80 years, 30 pack-year currently smoking OR have quit w/in 15years.) does not qualify.   Additional Screening:  Hepatitis C Screening: does qualify; declines  Vision Screening: Recommended annual ophthalmology exams for early detection of glaucoma and other disorders of the eye. Is the patient up to date with their annual eye exam?  Yes  Who is the provider or what is the name of the office in which the patient attends annual eye exams? [facility] If pt is not established with a provider, would they like to be referred to a provider to establish care? No .   Dental Screening: Recommended annual dental exams for proper oral hygiene  Community Resource Referral / Chronic Care Management: CRR required this visit?  No   CCM required this visit?  No      Plan:     I have personally reviewed and noted the following in the patient's chart:   . Medical and social history . Use of alcohol, tobacco or illicit drugs  . Current medications and supplements including opioid prescriptions. Patient is currently taking opioid prescriptions. Information provided to patient regarding non-opioid alternatives. Patient advised to discuss non-opioid treatment plan with their provider. . Functional ability and status . Nutritional status . Physical activity . Advanced directives . List of other physicians . Hospitalizations, surgeries, and ER visits in previous 12 months . Vitals . Screenings to include cognitive, depression, and falls . Referrals and appointments  In addition, I have reviewed and discussed with patient certain preventive protocols, quality metrics, and best practice recommendations. A written personalized care plan for  preventive services as well as general preventive health recommendations were provided to patient.     [place] Constablemy E Eldrick Penick, LPN   1/61/09605/26/2022   Nurse Notes: None

## 2021-02-08 ENCOUNTER — Telehealth: Payer: Self-pay | Admitting: Family Medicine

## 2021-02-08 NOTE — Telephone Encounter (Signed)
I put it back in my out-basket days ago. He stated it was for neck pain. I do not feel neck pain requires a handicap placard.

## 2021-02-08 NOTE — Telephone Encounter (Signed)
Can we locate the form so it can be signed?

## 2021-02-08 NOTE — Telephone Encounter (Signed)
Attempted to contact patient - NA °

## 2021-02-08 NOTE — Telephone Encounter (Signed)
Pt called to check on the status of his handicap form, he is aware that it is not completed and that Alona Bene is not able to complete it at this time. He would like to talk to someone about this because Dr Darlyn Read completed one for him in the past.

## 2021-02-08 NOTE — Telephone Encounter (Signed)
Pt aware.

## 2021-02-08 NOTE — Telephone Encounter (Signed)
Form ready to be picked up- left detailed message

## 2021-02-08 NOTE — Telephone Encounter (Signed)
Do you have the form?

## 2021-02-08 NOTE — Telephone Encounter (Signed)
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

## 2021-02-12 ENCOUNTER — Ambulatory Visit: Payer: Medicare Other | Admitting: Licensed Clinical Social Worker

## 2021-02-12 DIAGNOSIS — S02412D LeFort II fracture, subsequent encounter for fracture with routine healing: Secondary | ICD-10-CM

## 2021-02-12 DIAGNOSIS — E785 Hyperlipidemia, unspecified: Secondary | ICD-10-CM

## 2021-02-12 DIAGNOSIS — K1379 Other lesions of oral mucosa: Secondary | ICD-10-CM

## 2021-02-12 DIAGNOSIS — I2510 Atherosclerotic heart disease of native coronary artery without angina pectoris: Secondary | ICD-10-CM

## 2021-02-12 NOTE — Chronic Care Management (AMB) (Signed)
Chronic Care Management    Clinical Social Work Note  02/12/2021 Name: [name] [name]. MRN: [id number] DOB: Feb 03, 1946  [name] [name]. is a 75 y.o. year old male who is a primary care patient of Gwenlyn Fudge, FNP. The CCM team was consulted to assist the patient with chronic disease management and/or care coordination needs related to: [employer] .   Engaged with patient by telephone for follow up visit in response to provider referral for social work chronic care management and care coordination services.   Consent to Services:  The patient was given information about Chronic Care Management services, agreed to services, and gave verbal consent prior to initiation of services.  Please see initial visit note for detailed documentation.   Patient agreed to services and consent obtained.   Assessment: Review of patient past medical history, allergies, medications, and health status, including review of relevant consultants reports was performed today as part of a comprehensive evaluation and provision of chronic care management and care coordination services.     SDOH (Social Determinants of Health) assessments and interventions performed:  SDOH Interventions   Flowsheet Row Most Recent Value  SDOH Interventions   Depression Interventions/Treatment  --  [informed client of LCSW support and of RNCM support]       Advanced Directives Status: See Vynca application for related entries.  CCM Care Plan  Allergies  Allergen Reactions  . Lipitor [Atorvastatin] Other (See Comments)    "Makes pt feel funky"    Outpatient Encounter Medications as of 02/12/2021  Medication Sig  . aspirin 81 MG chewable tablet Chew 81 mg by mouth daily.  . cholecalciferol (VITAMIN D3) 25 MCG (1000 UNIT) tablet Take 1,000 Units by mouth daily.   [name] HYDROcodone-acetaminophen (NORCO) 7.5-325 MG tablet Take 1 tablet by mouth daily as needed for moderate pain.  . nitroGLYCERIN (NITROSTAT) 0.4  MG SL tablet Place 1 tablet (0.4 mg total) under the tongue every 5 (five) minutes as needed for chest pain.  . rosuvastatin (CRESTOR) 20 MG tablet Take 20 mg by mouth daily.  . tadalafil (CIALIS) 10 MG tablet Take 1-2 tablets (10-20 mg total) by mouth daily as needed.   No facility-administered encounter medications on file as of 02/12/2021.    Patient Active Problem List   Diagnosis Date Noted  . Controlled substance agreement signed 01/07/2021  . [place] fracture [facility]) 03/22/2020  . History of non-ST elevation myocardial infarction (NSTEMI)   . Spondylolisthesis at L4-L5 level 06/08/2017  . Coronary artery disease involving native coronary artery of native heart without angina pectoris 05/08/2017  . Mixed hyperlipidemia 05/08/2017  . Erectile dysfunction 03/29/2013    Conditions to be addressed/monitored: Monitor client management of ADLs and of dental needs   Care Plan : LCSW Care Plan  Updates made by [name], LCSW since 02/12/2021 12:00 AM    Problem: Coping Skills (General Plan of Care)     Goal: Coping Skills Enhanced: manage dental needs of client; manage daily needs of client   Start Date: 02/12/2021  Expected End Date: 05/14/2021  This Visit's Progress: On track  Recent Progress: On track  Priority: Medium  Note:   Current barriers:   . Patient in need of assistance with connecting to community resources for dental care needs . Eating/Chewing Challenges . Pain issues . Financial Challenges  Clinical Goals:  patient will work with SW in next 30 days to address concerns related to dental needs of client Patient will communicate  with SW in next 30 days to discuss ADLs completion for client  Clinical Interventions:  . Collaboration with Gwenlyn Fudge, FNP regarding development and update of comprehensive plan of care as evidenced by provider attestation and co-signature . Talked with client about eating/chewing issues for client . Talked with client about  upcoming client medical appointments . Talked with client about financial challenges of client related to dental care needs . Encouraged client to call RNCM as needed for CCM nursing support . Collaborated with RNCM regarding needs of client . Discussed client plans for dental care (he said he has been talking with dentist as part of [facility]. He said he has appointment to see Orthodontist soon. He said he plans to seek dental repair and support for dental needs through the [facility]  Patient Strengths: Drives to needed appointments Takes medications as prescribed Researches dental recourses in the community Eats meals regularly Advocates for self and his needs  Patient Deficits Dental needs Difficulty chewing and swallowing Some pain issues  Patient Goals:  Attend scheduled medical appointments Talked with RNCM or LCSW as needed for support Take medications as prescribed Continue to research dental resources in the area for possible help with dental needs of client -  Follow Up Plan: LCSW to call client on 03/27/21 to assess client needs     Kelton Pillar.Evangeline Utley MSW, LCSW Licensed Clinical Social Worker [facility] Care Management [phone]

## 2021-02-12 NOTE — Patient Instructions (Signed)
Visit Information  PATIENT GOALS: Goals Addressed            This Visit's Progress   . Manage My Emotions      . Protect My Health: Manage dental care needs       Timeframe:  Short-Term Goal Priority:  Medium Progress: On Track Start Date:      02/12/21                       Expected End Date:    05/14/21               Follow Up Date 03/27/21   Protect My Health (Patient) Manage dental needs of client    Why is this important?    Screening tests can find diseases early when they are easier to treat.   Your doctor or nurse will talk with you about which tests are important for you.   Getting shots for common diseases like the flu and shingles will help prevent them.      Patient Strengths: Drives to needed appointments Takes medications as prescribed Researches dental recourses in the community Eats meals regularly Advocates for self and his needs  Patient Deficits Dental needs Difficulty chewing and swallowing Some pain issues  Patient Goals:  Attend scheduled medical appointments Talked with RNCM or LCSW as needed for support Take medications as prescribed Continue to research dental resources in the area for possible help with dental needs of client -  Follow Up Plan: LCSW to call client on 03/27/21 to assess client needs       Kelton Pillar.Nilan Iddings MSW, LCSW Licensed Clinical Social Worker [facility] Care Management [phone]

## 2021-03-20 DIAGNOSIS — Z0120 Encounter for dental examination and cleaning without abnormal findings: Secondary | ICD-10-CM | POA: Diagnosis not present

## 2021-03-27 ENCOUNTER — Ambulatory Visit (INDEPENDENT_AMBULATORY_CARE_PROVIDER_SITE_OTHER): Payer: Medicare Other | Admitting: Licensed Clinical Social Worker

## 2021-03-27 DIAGNOSIS — I25119 Atherosclerotic heart disease of native coronary artery with unspecified angina pectoris: Secondary | ICD-10-CM

## 2021-03-27 DIAGNOSIS — E785 Hyperlipidemia, unspecified: Secondary | ICD-10-CM

## 2021-03-27 DIAGNOSIS — I252 Old myocardial infarction: Secondary | ICD-10-CM

## 2021-03-27 DIAGNOSIS — S02412D LeFort II fracture, subsequent encounter for fracture with routine healing: Secondary | ICD-10-CM

## 2021-03-27 NOTE — Patient Instructions (Signed)
Visit Information  PATIENT GOALS:  Goals Addressed             This Visit's Progress    Protect My Health: Manage dental care needs       Timeframe:  Short-Term Goal Priority:  Medium Progress: On Track Start Date:      03/27/21                       Expected End Date:    06/18/21               Follow Up Date 05/06/21   Protect My Health (Patient) Manage dental needs of client    Why is this important?   Screening tests can find diseases early when they are easier to treat.  Your doctor or nurse will talk with you about which tests are important for you.  Getting shots for common diseases like the flu and shingles will help prevent them.     Patient Strengths: Drives to needed appointments Takes medications as prescribed Researches dental recourses in the community Eats meals regularly Advocates for self and his needs  Patient Deficits Dental needs Difficulty chewing and swallowing Some pain issues  Patient Goals:  Attend scheduled medical appointments Talked with RNCM or LCSW as needed for support Take medications as prescribed Continue to research dental resources in the area for possible help with dental needs of client -  Follow Up Plan: LCSW to call client on 05/06/21 to assess client needs     [name] [name].[name] [name] MSW, LCSW Licensed Clinical Social Worker [facility] Care Management [phone]

## 2021-03-27 NOTE — Chronic Care Management (AMB) (Signed)
Chronic Care Management    Clinical Social Work Note  03/27/2021 Name: [name] [name]. MRN: [id number] DOB: 1946-02-24  [name] Blood. is a 75 y.o. year old male who is a primary care patient of Gwenlyn Fudge, FNP. The CCM team was consulted to assist the patient with chronic disease management and/or care coordination needs related to: [employer] .   Engaged with patient by telephone for follow up visit in response to provider referral for social work chronic care management and care coordination services.   Consent to Services:  The patient was given information about Chronic Care Management services, agreed to services, and gave verbal consent prior to initiation of services.  Please see initial visit note for detailed documentation.   Patient agreed to services and consent obtained.   Assessment: Review of patient past medical history, allergies, medications, and health status, including review of relevant consultants reports was performed today as part of a comprehensive evaluation and provision of chronic care management and care coordination services.     SDOH (Social Determinants of Health) assessments and interventions performed:  SDOH Interventions    Flowsheet Row Most Recent Value  SDOH Interventions   Depression Interventions/Treatment  --  [informed client of LCSW support and of RNCM support]        Advanced Directives Status: See Vynca application for related entries.  CCM Care Plan  Allergies  Allergen Reactions   Lipitor [Atorvastatin] Other (See Comments)    "Makes pt feel funky"    Outpatient Encounter Medications as of 03/27/2021  Medication Sig   aspirin 81 MG chewable tablet Chew 81 mg by mouth daily.   cholecalciferol (VITAMIN D3) 25 MCG (1000 UNIT) tablet Take 1,000 Units by mouth daily.    HYDROcodone-acetaminophen (NORCO) 7.5-325 MG tablet Take 1 tablet by mouth daily as needed for moderate pain.   nitroGLYCERIN (NITROSTAT) 0.4  MG SL tablet Place 1 tablet (0.4 mg total) under the tongue every 5 (five) minutes as needed for chest pain.   rosuvastatin (CRESTOR) 20 MG tablet Take 20 mg by mouth daily.   tadalafil (CIALIS) 10 MG tablet Take 1-2 tablets (10-20 mg total) by mouth daily as needed.   No facility-administered encounter medications on file as of 03/27/2021.    Patient Active Problem List   Diagnosis Date Noted   Controlled substance agreement signed 01/07/2021   [name] [facility]) 03/22/2020   History of non-ST elevation myocardial infarction (NSTEMI)    Spondylolisthesis at L4-L5 level 06/08/2017   Coronary artery disease involving native coronary artery of native heart without angina pectoris 05/08/2017   Mixed hyperlipidemia 05/08/2017   Erectile dysfunction 03/29/2013    Conditions to be addressed/monitored: monitor client management of ongoing dental needs of client   Care Plan : LCSW Care Plan  Updates made by [name], LCSW since 03/27/2021 12:00 AM     Problem: Coping Skills (General Plan of Care)         Start Date: 03/27/2021  Expected End Date: 06/18/2021  This Visit's Progress: On track  Recent Progress: On track  Priority: Medium  Note:   Current barriers:   Patient in need of assistance with connecting to community resources for dental care needs Eating/Chewing Challenges Pain issues Financial Challenges  Clinical Goals:  patient will work with SW in next 30 days to address concerns related to dental needs of client Patient will communicate with SW in next 30 days to discuss ADLs completion for client  Clinical Interventions:  Collaboration with Gwenlyn Fudge, FNP regarding development and update of comprehensive plan of care as evidenced by provider attestation and co-signature Talked with client about dental care through [facility] Talked with client about recent appointments for him at [facility] Talked with client about eating/chewing  issues for client Talked with client about upcoming client medical appointments Talked with client about financial challenges of client related to dental care needs Encouraged client to call RNCM as needed for CCM nursing support Talked with client about social support with friends and neighbors Talked with client about pain issues of client (he said he has pain when he accidentally bites his gums occasionally) Talked with client about sleeping issues of client Provided counseling support for client Congratulated client on working with his insurance  company and with [facility] related to making a dental plan moving forward for client  Patient Strengths: Drives to needed appointments Takes medications as prescribed Researches dental recourses in the community Eats meals regularly Advocates for self and his needs  Patient Deficits Dental needs Difficulty chewing and swallowing Some pain issues  Patient Goals:  Attend scheduled medical appointments Talked with RNCM or LCSW as needed for support Take medications as prescribed Continue to research dental resources in the area for possible help with dental needs of client -  Follow Up Plan: LCSW to call client on 05/06/21 to assess client needs     Kelton Pillar.Jessee Newnam MSW, LCSW Licensed Clinical Social Worker [facility] Care Management [phone]

## 2021-04-04 ENCOUNTER — Other Ambulatory Visit: Payer: Self-pay

## 2021-04-04 ENCOUNTER — Ambulatory Visit (INDEPENDENT_AMBULATORY_CARE_PROVIDER_SITE_OTHER): Payer: Medicare Other | Admitting: Family Medicine

## 2021-04-04 ENCOUNTER — Encounter: Payer: Self-pay | Admitting: Family Medicine

## 2021-04-04 VITALS — BP 135/80 | HR 57 | Temp 97.1°F | Ht 66.0 in | Wt 140.8 lb

## 2021-04-04 DIAGNOSIS — I2510 Atherosclerotic heart disease of native coronary artery without angina pectoris: Secondary | ICD-10-CM

## 2021-04-04 DIAGNOSIS — S02412D LeFort II fracture, subsequent encounter for fracture with routine healing: Secondary | ICD-10-CM | POA: Diagnosis not present

## 2021-04-04 DIAGNOSIS — Z79899 Other long term (current) drug therapy: Secondary | ICD-10-CM | POA: Diagnosis not present

## 2021-04-04 DIAGNOSIS — E782 Mixed hyperlipidemia: Secondary | ICD-10-CM | POA: Diagnosis not present

## 2021-04-04 DIAGNOSIS — I252 Old myocardial infarction: Secondary | ICD-10-CM

## 2021-04-04 MED ORDER — HYDROCODONE-ACETAMINOPHEN 7.5-325 MG PO TABS: 1.0000 | ORAL_TABLET | Freq: Every day | ORAL | 0 refills | Status: DC | PRN
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
## 2021-04-07 ENCOUNTER — Encounter: Payer: Self-pay | Admitting: Family Medicine

## 2021-04-08 LAB — COMPLIANCE DRUG ANALYSIS, UR

## 2021-05-06 ENCOUNTER — Telehealth: Payer: Medicare Other

## 2021-06-14 ENCOUNTER — Ambulatory Visit (INDEPENDENT_AMBULATORY_CARE_PROVIDER_SITE_OTHER): Payer: Medicare Other | Admitting: Licensed Clinical Social Worker

## 2021-06-14 DIAGNOSIS — S02412D LeFort II fracture, subsequent encounter for fracture with routine healing: Secondary | ICD-10-CM

## 2021-06-14 DIAGNOSIS — E785 Hyperlipidemia, unspecified: Secondary | ICD-10-CM

## 2021-06-14 DIAGNOSIS — I25119 Atherosclerotic heart disease of native coronary artery with unspecified angina pectoris: Secondary | ICD-10-CM | POA: Diagnosis not present

## 2021-06-14 DIAGNOSIS — I252 Old myocardial infarction: Secondary | ICD-10-CM

## 2021-06-14 NOTE — Patient Instructions (Signed)
Visit Information  PATIENT GOALS:  Goals Addressed             This Visit's Progress    Protect My Health: Manage dental care needs       Timeframe:  Short-Term Goal Priority:  Medium Progress: On Track Start Date:     06/14/21                       Expected End Date:   09/12/21               Follow Up Date 08/01/21 at 2:00 PM   Protect My Health (Patient) Manage dental needs of client    Why is this important?   Screening tests can find diseases early when they are easier to treat.  Your doctor or nurse will talk with you about which tests are important for you.  Getting shots for common diseases like the flu and shingles will help prevent them.     Patient Strengths: Drives to needed appointments Takes medications as prescribed Researches dental recourses in the community Eats meals regularly Advocates for self and his needs  Patient Deficits Dental needs Difficulty chewing and swallowing Some pain issues  Patient Goals:  Attend scheduled medical appointments Talked with RNCM or LCSW as needed for support Take medications as prescribed Continue to research dental resources in the area for possible help with dental needs of client -  Follow Up Plan: LCSW to call client on 08/01/21 at 2:00 PM to assess client needs    Kelton Pillar.Lashala Laser MSW, LCSW Licensed Clinical Social Worker [facility] Care Management [phone]

## 2021-06-14 NOTE — Chronic Care Management (AMB) (Signed)
Chronic Care Management    Clinical Social Work Note  06/14/2021 Name: [name] [name]. MRN: [id number] DOB: 11-12-1945  [name] [name]. is a 75 y.o. year old male who is a primary care patient of Gwenlyn Fudge, FNP. The CCM team was consulted to assist the patient with chronic disease management and/or care coordination needs related to: [employer] .   Engaged with patient by telephone for follow up visit in response to provider referral for social work chronic care management and care coordination services.   Consent to Services:  The patient was given information about Chronic Care Management services, agreed to services, and gave verbal consent prior to initiation of services.  Please see initial visit note for detailed documentation.   Patient agreed to services and consent obtained.   Assessment: Review of patient past medical history, allergies, medications, and health status, including review of relevant consultants reports was performed today as part of a comprehensive evaluation and provision of chronic care management and care coordination services.     SDOH (Social Determinants of Health) assessments and interventions performed:  SDOH Interventions    Flowsheet Row Most Recent Value  SDOH Interventions   Food Insecurity Interventions --  [client has difficulty chewing and swallowing food due to past injury.  Has to cut solid food into small bites in order to eat that food]  Stress Interventions Provide Counseling  [client has stress related to arranging for needed dental treatment]  Depression Interventions/Treatment  Counseling        Advanced Directives Status: See Vyn[employer] application for related entries.  CCM Care Plan  Allergies  Allergen Reactions   Lipitor [Atorvastatin] Other (See Comments)    "Makes pt feel funky"    Outpatient Encounter Medications as of 06/14/2021  Medication Sig   aspirin 81 MG chewable tablet Chew 81 mg by mouth daily.    cholecalciferol (VITAMIN D3) 25 MCG (1000 UNIT) tablet Take 1,000 Units by mouth daily.    HYDROcodone-acetaminophen (NORCO) 7.5-325 MG tablet Take 1 tablet by mouth daily as needed for moderate pain.   nitroGLYCERIN (NITROSTAT) 0.4 MG SL tablet Place 1 tablet (0.4 mg total) under the tongue every 5 (five) minutes as needed for chest pain.   rosuvastatin (CRESTOR) 20 MG tablet Take 20 mg by mouth daily. (Patient not taking: Reported on 04/04/2021)   tadalafil (CIALIS) 10 MG tablet Take 1-2 tablets (10-20 mg total) by mouth daily as needed.   No facility-administered encounter medications on file as of 06/14/2021.    Patient Active Problem List   Diagnosis Date Noted   Controlled substance agreement signed 01/07/2021   [name] [facility]) 03/22/2020   History of non-ST elevation myocardial infarction (NSTEMI)    Spondylolisthesis at L4-L5 level 06/08/2017   Coronary artery disease involving native coronary artery of native heart without angina pectoris 05/08/2017   Mixed hyperlipidemia 05/08/2017   Erectile dysfunction 03/29/2013    Conditions to be addressed/monitored: monitor client management of dental needs faced  Care Plan : LCSW Care Plan  Updates made by [name], LCSW since 06/14/2021 12:00 AM     Problem: Coping Skills (General Plan of Care)      Goal: Coping Skills Enhanced: manage dental needs of client; manage daily needs of client   Start Date: 06/14/2021  Expected End Date: 09/12/2021  This Visit's Progress: On track  Recent Progress: On track  Priority: Medium  Note:   Current barriers:   Patient in need of  assistance with connecting to community resources for dental care needs Eating/Chewing Challenges Pain issues Financial Challenges  Clinical Goals:  patient will work with SW in next 30 days to address concerns related to dental needs of client Patient will communicate with SW in next 30 days to discuss ADLs completion for client  Clinical  Interventions:  Collaboration with Gwenlyn Fudge, FNP regarding development and update of comprehensive plan of care as evidenced by provider attestation and co-signature Reviewed with client his recent steps in addressing dental needs. He said he has had two appointments at [facility] related to evaluating his dental needs. Discussed with [name] communication with insurance provider for client and his seeking coverage for his dental needs with insurance provider.  Encouraged [name] to advocate for himself  with insurance company related to dental repair needs due to his difficulty in chewing and swallowing solid food and due to his weight loss in past 4 months Discussed with client his difficulty in eating and swallowing solid food due to past injury [facility] Fracture; he has to cut solid food into small bites in order to eat) Reviewed client appointments with [name] and recommended that he schedule his next appointment (annual physical ) with [name] as soon as possible Evaluated financial challenges of client related to dental care needs and recommended [name] continue to communicate actively with insurance provider to seek financial assistance to cover dental dental repairs. Interviewed client related to his social support system (decreased social support) Discussed with [name] pain issues experienced. He said he has jaw pain, periodic gum pain issues. Provided counseling support for client  Patient Strengths: Drives to needed appointments Takes medications as prescribed Researches dental recourses in the community Eats meals regularly Advocates for self and his needs  Patient Deficits Dental needs Difficulty chewing and swallowing Some pain issues  Patient Goals:  Attend scheduled medical appointments Talked with RNCM or LCSW as needed for support Take medications as prescribed Continue to research dental resources in the area for possible help with dental needs  of client -  Follow Up Plan: LCSW to call client on 08/01/21 at 2:00 PM to assess client needs.       Kelton Pillar.Nolin Grell MSW, LCSW Licensed Clinical Social Worker [facility] Care Management [phone]

## 2021-07-09 ENCOUNTER — Encounter: Payer: Self-pay | Admitting: Family Medicine

## 2021-07-09 ENCOUNTER — Other Ambulatory Visit: Payer: Self-pay

## 2021-07-09 ENCOUNTER — Ambulatory Visit: Payer: Medicare Other | Admitting: Family Medicine

## 2021-07-09 ENCOUNTER — Ambulatory Visit (INDEPENDENT_AMBULATORY_CARE_PROVIDER_SITE_OTHER): Payer: Medicare Other | Admitting: Family Medicine

## 2021-07-09 VITALS — BP 142/80 | HR 64 | Temp 96.5°F | Resp 20 | Ht 66.0 in | Wt 143.0 lb

## 2021-07-09 DIAGNOSIS — R634 Abnormal weight loss: Secondary | ICD-10-CM | POA: Diagnosis not present

## 2021-07-09 DIAGNOSIS — Z79899 Other long term (current) drug therapy: Secondary | ICD-10-CM | POA: Diagnosis not present

## 2021-07-09 DIAGNOSIS — Z0001 Encounter for general adult medical examination with abnormal findings: Secondary | ICD-10-CM

## 2021-07-09 DIAGNOSIS — Z0000 Encounter for general adult medical examination without abnormal findings: Secondary | ICD-10-CM

## 2021-07-09 DIAGNOSIS — S02412D LeFort II fracture, subsequent encounter for fracture with routine healing: Secondary | ICD-10-CM

## 2021-07-09 LAB — CMP14+EGFR
ALT: 23 IU/L (ref 0–44)
AST: 24 IU/L (ref 0–40)
Albumin/Globulin Ratio: 1.8 (ref 1.2–2.2)
Albumin: 4.1 g/dL (ref 3.7–4.7)
Alkaline Phosphatase: 72 IU/L (ref 44–121)
BUN/Creatinine Ratio: 16 (ref 10–24)
BUN: 14 mg/dL (ref 8–27)
Bilirubin Total: 0.4 mg/dL (ref 0.0–1.2)
CO2: 24 mmol/L (ref 20–29)
Calcium: 9.5 mg/dL (ref 8.6–10.2)
Chloride: 104 mmol/L (ref 96–106)
Creatinine, Ser: 0.87 mg/dL (ref 0.76–1.27)
Globulin, Total: 2.3 g/dL (ref 1.5–4.5)
Glucose: 87 mg/dL (ref 70–99)
Potassium: 5.2 mmol/L (ref 3.5–5.2)
Sodium: 140 mmol/L (ref 134–144)
Total Protein: 6.4 g/dL (ref 6.0–8.5)
eGFR: 90 mL/min/{1.73_m2} (ref >59)

## 2021-07-09 NOTE — Progress Notes (Signed)
Assessment & Plan:  1. Well adult exam Preventive health education provided.  2. Unintended weight loss Patient has lost 5 lbs in the past 6 months. - CMP14+EGFR  3-4. Closed [name] [name] fracture with routine healing, subsequent encounter/Controlled substance agreement signed Well controlled on current regimen. Controlled substance agreement in place. Urine drug screen as expected. PDMP reviewed with no concerning findings.  - HYDROcodone-acetaminophen (NORCO) 7.5-325 MG tablet; Take 1 tablet by mouth daily as needed for moderate pain.  Dispense: 30 tablet; Refill: 0 - HYDROcodone-acetaminophen (NORCO) 7.5-325 MG tablet; Take 1 tablet by mouth daily as needed for moderate pain.  Dispense: 30 tablet; Refill: 0   Follow-up: Return in about 6 months (around 01/07/2022) for follow-up of chronic medication conditions.   Hendricks Limes, MSN, APRN, FNP-C [facility]  Subjective:  Patient ID: [name] [name]., male    DOB: [birth year]  Age: 75 y.o. MRN: [id number]  Patient Care Team: [name], FNP as PCP - General (Family Medicine) Nahser, Wonda Cheng, MD as PCP - Cardiology (Cardiology) Shea Evans, Norva Riffle, LCSW as Social Worker (Licensed Clinical Social Worker) Cori Razor, Delice Bison, RN as Case Freight forwarder   CC:  Chief Complaint  Patient presents with   Annual Exam    HPI [name] [name]. presents for his annual physical.  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 Lajuana Matte at [facility] Last colonoscopy: never - declines colonoscopy and cologuard Lung Cancer Screening with low-dose Chest CT: declines AAA Screening: declines Hepatitis C Screening: Declined PSA: WNL 01/03/2021 - aged out of future PSA measurements Immunizations: Flu Vaccine: declined Tdap Vaccine: declined   Shingrix Vaccine: declined  COVID-19 Vaccine: declined Pneumonia Vaccine: declined  Advanced Directives Patient does have advanced directives including living will and healthcare power of attorney. He does have a copy in the electronic medical record.   DEPRESSION SCREENING PHQ 2/9 Scores 07/09/2021 06/14/2021 04/04/2021 03/27/2021 02/12/2021 02/07/2021 01/04/2021  PHQ - 2 Score 0 2 0 _0 0  PHQ- 9 Score 0 _1 Patient is requesting a refill of his Norco. He still has a few tablets left.   Since he is having a hard time eating due to his lack of teeth and pain, he reports he is losing weight. He is having a hard time getting insurance to pay for the dental work he needs completed, stating it is not medical.    Review of Systems  Constitutional:  Negative for chills, fever, malaise/fatigue and weight loss.  HENT:  Negative for congestion, ear discharge, ear pain, nosebleeds, sinus pain, sore throat and tinnitus.   Eyes:  Negative for blurred vision, double vision, pain, discharge and redness.  Respiratory:  Negative for cough, shortness of breath and wheezing.   Cardiovascular:  Negative for chest pain, palpitations and leg swelling.  Gastrointestinal:  Negative for abdominal pain, constipation, diarrhea, heartburn, nausea and vomiting.  Genitourinary:  Negative for dysuria, frequency and urgency.  Musculoskeletal:  Negative for myalgias.  Skin:  Negative for rash.  Neurological:  Negative for dizziness, seizures, weakness and headaches.  Psychiatric/Behavioral:  Negative for depression, substance abuse and suicidal ideas. The patient is not nervous/anxious.     Current Outpatient Medications:    aspirin 81 MG chewable tablet, Chew 81 mg by mouth daily., Disp: ,  Rfl:    cholecalciferol (VITAMIN D3) 25 MCG (1000 UNIT) tablet, Take 1,000 Units by mouth daily. , Disp: , Rfl:    HYDROcodone-acetaminophen (NORCO) 7.5-325 MG tablet, Take 1 tablet by mouth daily as needed for  moderate pain., Disp: 30 tablet, Rfl: 0   nitroGLYCERIN (NITROSTAT) 0.4 MG SL tablet, Place 1 tablet (0.4 mg total) under the tongue every 5 (five) minutes as needed for chest pain., Disp: 25 tablet, Rfl: 2   rosuvastatin (CRESTOR) 20 MG tablet, Take 20 mg by mouth daily., Disp: , Rfl:    tadalafil (CIALIS) 10 MG tablet, Take 1-2 tablets (10-20 mg total) by mouth daily as needed., Disp: 10 tablet, Rfl: 2  Allergies  Allergen Reactions   Lipitor [Atorvastatin] Other (See Comments)    "Makes pt feel funky"    Past Medical History:  Diagnosis Date   Coronary artery disease    Erectile dysfunction    History of hiatal hernia    a lone time ago   Hyperlipidemia    [place] fracture [facility]) 03/22/2020   Myocardial infarction ([place])    x 2   Polio     Past Surgical History:  Procedure Laterality Date   APPENDECTOMY     CARDIAC CATHETERIZATION     has two stents placed   CERVICAL SPINE SURGERY     c3-4, 4-5,  5-6, 8 screws and plates   CORONARY STENT PLACEMENT     forein body removal     metal sharp removed from right leg   LEFT HEART CATH AND CORONARY ANGIOGRAPHY N/A 11/15/2018   Procedure: LEFT HEART CATH AND CORONARY ANGIOGRAPHY;  Surgeon: Nelva Bush, MD;  Location: [facility];  Service: Cardiovascular;  Laterality: N/A;   MANDIBULAR HARDWARE REMOVAL Bilateral 04/23/2020   Procedure: MANDIBULAR HARDWARE REMOVAL;  Surgeon: Izora Gala, MD;  Location: [place];  Service: ENT;  Laterality: Bilateral;   ORIF MANDIBULAR FRACTURE N/A 03/14/2020   Procedure: OPEN REDUCTION INTERNAL FIXATION (ORIF) MID  FACE FRACTURE, MANDIBULAR FIXATION MODIFIED ARCH BARS;  Surgeon: Izora Gala, MD;  Location: [facility];  Service: ENT;  Laterality: N/A;   SCAR REVISION N/A 04/23/2020   Procedure: SCAR REVISION/ Upper Lip Repair;  Surgeon: Izora Gala, MD;  Location: [place];  Service: ENT;  Laterality: N/A;    Family History  Problem Relation Age of Onset    Diabetes Mother    Heart disease Mother    Heart disease Father     Social History   Socioeconomic History   Marital status: Divorced    Spouse name: Not on file   Number of children: Not on file   Years of education: Not on file   Highest education level: Not on file  Occupational History   Not on file  Tobacco Use   Smoking status: Former   Smokeless tobacco: Never  Vaping Use   Vaping Use: Never used  Substance and Sexual Activity   Alcohol use: Yes    Alcohol/week: 21.0 standard drinks    Types: 21 Cans of beer per week    Comment: 2 beers daily   Drug use: No   Sexual activity: Not on file  Other Topics Concern   Not on file  Social History Narrative   Mr. Tomas has a restraining order against one of his sons.   Social Determinants of Health   Financial Resource Strain: Low Risk    Difficulty of Paying Living Expenses: Not very hard  Food  Insecurity: Food Insecurity Present   Worried About Charity fundraiser in the Last Year: Sometimes true   Ran Out of Food in the Last Year: Never true  Transportation Needs: No Transportation Needs   Lack of Transportation (Medical): No   Lack of Transportation (Non-Medical): No  Physical Activity: Sufficiently Active   Days of Exercise per Week: 7 days   Minutes of Exercise per Session: 40 min  Stress: Stress Concern Present   Feeling of Stress : Rather much  Social Connections: Moderately Integrated   Frequency of Communication with Friends and Family: More than three times a week   Frequency of Social Gatherings with Friends and Family: More than three times a week   Attends Religious Services: More than 4 times per year   Active Member of [employer] or Organizations: Yes   Attends Music therapist: More than 4 times per year   Marital Status: Divorced  Human resources officer Violence: Not At Risk   Fear of Current or Ex-Partner: No   Emotionally Abused: No   Physically Abused: No   Sexually Abused: No       Objective:    BP (!) 142/80   Pulse 64   Temp (!) 96.5 F (35.8 C) (Temporal)   Resp 20   Ht _0  (1.676 m)   Wt 143 lb (64.9 kg)   SpO2 98%   BMI 23.08 kg/m   Wt Readings from Last 3 Encounters:  07/09/21 143 lb (64.9 kg)  04/04/21 140 lb 12.8 oz (63.9 kg)  02/07/21 149 lb (67.6 kg)    Physical Exam Vitals reviewed.  Constitutional:      General: He is not in acute distress.    Appearance: Normal appearance. He is normal weight. He is not ill-appearing, toxic-appearing or diaphoretic.  HENT:     Head: Normocephalic and atraumatic.     Right Ear: Tympanic membrane, ear canal and external ear normal. There is no impacted cerumen.     Left Ear: Tympanic membrane, ear canal and external ear normal. There is no impacted cerumen.     Nose: Nose normal. No congestion or rhinorrhea.     Mouth/Throat:     Mouth: Mucous membranes are moist.     Pharynx: Oropharynx is clear. No oropharyngeal exudate or posterior oropharyngeal erythema.  Eyes:     General: No scleral icterus.       Right eye: No discharge.        Left eye: No discharge.     Conjunctiva/sclera: Conjunctivae normal.     Pupils: Pupils are equal, round, and reactive to light.  Neck:     Vascular: No carotid bruit.  Cardiovascular:     Rate and Rhythm: Normal rate and regular rhythm.     Heart sounds: Normal heart sounds. No murmur heard.   No friction rub. No gallop.  Pulmonary:     Effort: Pulmonary effort is normal. No respiratory distress.     Breath sounds: Normal breath sounds. No stridor. No wheezing, rhonchi or rales.  Abdominal:     General: Abdomen is flat. Bowel sounds are normal. There is no distension.     Palpations: Abdomen is soft. There is no hepatomegaly, splenomegaly or mass.     Tenderness: There is no abdominal tenderness. There is no guarding or rebound.     Hernia: No hernia is present.  Musculoskeletal:        General: Normal range of motion.     Cervical back: Normal  range of motion  and neck supple. No rigidity. No muscular tenderness.     Right lower leg: No edema.     Left lower leg: No edema.  Lymphadenopathy:     Cervical: No cervical adenopathy.  Skin:    General: Skin is warm and dry.     Capillary Refill: Capillary refill takes less than 2 seconds.  Neurological:     General: No focal deficit present.     Mental Status: He is alert and oriented to person, place, and time. Mental status is at baseline.  Psychiatric:        Mood and Affect: Mood normal.        Behavior: Behavior normal.        Thought Content: Thought content normal.        Judgment: Judgment normal.    Lab Results  Component Value Date   TSH 0.704 01/02/2021   Lab Results  Component Value Date   WBC 9.5 01/02/2021   HGB 16.6 01/02/2021   HCT 49.0 01/02/2021   MCV 92 01/02/2021   PLT 211 01/02/2021   Lab Results  Component Value Date   NA 137 01/02/2021   K 4.4 01/02/2021   CO2 22 01/02/2021   GLUCOSE 86 01/02/2021   BUN 20 01/02/2021   CREATININE 0.88 01/02/2021   BILITOT 0.5 01/02/2021   ALKPHOS 70 01/02/2021   AST 28 01/02/2021   ALT 25 01/02/2021   PROT 6.8 01/02/2021   ALBUMIN 4.5 01/02/2021   CALCIUM 9.5 01/02/2021   ANIONGAP 8 03/13/2020   EGFR 90 01/02/2021   Lab Results  Component Value Date   CHOL 158 01/02/2021   Lab Results  Component Value Date   HDL 39 (L) 01/02/2021   Lab Results  Component Value Date   LDLCALC 103 (H) 01/02/2021   Lab Results  Component Value Date   TRIG 83 01/02/2021   Lab Results  Component Value Date   CHOLHDL 4.1 01/02/2021   Lab Results  Component Value Date   HGBA1C 5.3 11/15/2018

## 2021-07-13 ENCOUNTER — Inpatient Hospital Stay (HOSPITAL_COMMUNITY)
Admission: EM | Admit: 2021-07-13 | Discharge: 2021-07-16 | DRG: 246 | Disposition: A | Discharge status: Home / Self Care | Payer: Medicare Other | Attending: Internal Medicine | Admitting: Internal Medicine

## 2021-07-13 ENCOUNTER — Encounter (HOSPITAL_COMMUNITY): Payer: Self-pay

## 2021-07-13 ENCOUNTER — Emergency Department (HOSPITAL_COMMUNITY): Payer: Medicare Other

## 2021-07-13 ENCOUNTER — Other Ambulatory Visit: Payer: Self-pay

## 2021-07-13 ENCOUNTER — Encounter (HOSPITAL_COMMUNITY)
Admission: EM | Disposition: A | Discharge status: Home / Self Care | Payer: Self-pay | Source: Home / Self Care | Attending: Internal Medicine

## 2021-07-13 DIAGNOSIS — Z20822 Contact with and (suspected) exposure to covid-19: Secondary | ICD-10-CM | POA: Diagnosis present

## 2021-07-13 DIAGNOSIS — I255 Ischemic cardiomyopathy: Secondary | ICD-10-CM | POA: Diagnosis present

## 2021-07-13 DIAGNOSIS — Z888 Allergy status to other drugs, medicaments and biological substances status: Secondary | ICD-10-CM | POA: Diagnosis not present

## 2021-07-13 DIAGNOSIS — Z7982 Long term (current) use of aspirin: Secondary | ICD-10-CM | POA: Diagnosis not present

## 2021-07-13 DIAGNOSIS — I21A9 Other myocardial infarction type: Secondary | ICD-10-CM | POA: Diagnosis present

## 2021-07-13 DIAGNOSIS — I110 Hypertensive heart disease with heart failure: Secondary | ICD-10-CM | POA: Diagnosis present

## 2021-07-13 DIAGNOSIS — J439 Emphysema, unspecified: Secondary | ICD-10-CM | POA: Diagnosis present

## 2021-07-13 DIAGNOSIS — I2510 Atherosclerotic heart disease of native coronary artery without angina pectoris: Secondary | ICD-10-CM | POA: Diagnosis not present

## 2021-07-13 DIAGNOSIS — E782 Mixed hyperlipidemia: Secondary | ICD-10-CM | POA: Diagnosis present

## 2021-07-13 DIAGNOSIS — I25119 Atherosclerotic heart disease of native coronary artery with unspecified angina pectoris: Secondary | ICD-10-CM

## 2021-07-13 DIAGNOSIS — Y831 Surgical operation with implant of artificial internal device as the cause of abnormal reaction of the patient, or of later complication, without mention of misadventure at the time of the procedure: Secondary | ICD-10-CM | POA: Diagnosis present

## 2021-07-13 DIAGNOSIS — Z955 Presence of coronary angioplasty implant and graft: Secondary | ICD-10-CM

## 2021-07-13 DIAGNOSIS — Z833 Family history of diabetes mellitus: Secondary | ICD-10-CM | POA: Diagnosis not present

## 2021-07-13 DIAGNOSIS — I5020 Unspecified systolic (congestive) heart failure: Secondary | ICD-10-CM

## 2021-07-13 DIAGNOSIS — I25110 Atherosclerotic heart disease of native coronary artery with unstable angina pectoris: Secondary | ICD-10-CM | POA: Diagnosis present

## 2021-07-13 DIAGNOSIS — I214 Non-ST elevation (NSTEMI) myocardial infarction: Secondary | ICD-10-CM | POA: Diagnosis present

## 2021-07-13 DIAGNOSIS — Z87891 Personal history of nicotine dependence: Secondary | ICD-10-CM | POA: Diagnosis not present

## 2021-07-13 DIAGNOSIS — I5022 Chronic systolic (congestive) heart failure: Secondary | ICD-10-CM | POA: Diagnosis not present

## 2021-07-13 DIAGNOSIS — T82855A Stenosis of coronary artery stent, initial encounter: Secondary | ICD-10-CM | POA: Diagnosis present

## 2021-07-13 DIAGNOSIS — I252 Old myocardial infarction: Secondary | ICD-10-CM | POA: Diagnosis not present

## 2021-07-13 DIAGNOSIS — I5042 Chronic combined systolic (congestive) and diastolic (congestive) heart failure: Secondary | ICD-10-CM | POA: Diagnosis present

## 2021-07-13 DIAGNOSIS — Z8249 Family history of ischemic heart disease and other diseases of the circulatory system: Secondary | ICD-10-CM

## 2021-07-13 DIAGNOSIS — I493 Ventricular premature depolarization: Secondary | ICD-10-CM | POA: Diagnosis present

## 2021-07-13 DIAGNOSIS — Z79899 Other long term (current) drug therapy: Secondary | ICD-10-CM

## 2021-07-13 LAB — TROPONIN I (HIGH SENSITIVITY)
Troponin I (High Sensitivity): 40 ng/L — ABNORMAL HIGH (ref <18)
Troponin I (High Sensitivity): 178 ng/L (ref <18)
Troponin I (High Sensitivity): 647 ng/L (ref <18)

## 2021-07-13 LAB — COMPREHENSIVE METABOLIC PANEL
ALT: 23 U/L (ref 0–44)
AST: 23 U/L (ref 15–41)
Albumin: 3.5 g/dL (ref 3.5–5.0)
Alkaline Phosphatase: 57 U/L (ref 38–126)
Anion gap: 7 (ref 5–15)
BUN: 21 mg/dL (ref 8–23)
CO2: 28 mmol/L (ref 22–32)
Calcium: 9.3 mg/dL (ref 8.9–10.3)
Chloride: 104 mmol/L (ref 98–111)
Creatinine, Ser: 0.97 mg/dL (ref 0.61–1.24)
GFR, Estimated: >60 mL/min (ref >60)
Glucose, Bld: 119 mg/dL — ABNORMAL HIGH (ref 70–99)
Potassium: 4.6 mmol/L (ref 3.5–5.1)
Sodium: 139 mmol/L (ref 135–145)
Total Bilirubin: 0.8 mg/dL (ref 0.3–1.2)
Total Protein: 6.2 g/dL — ABNORMAL LOW (ref 6.5–8.1)

## 2021-07-13 LAB — CBC WITH DIFFERENTIAL/PLATELET
Abs Immature Granulocytes: 0.07 10*3/uL (ref 0.00–0.07)
Basophils Absolute: 0.1 10*3/uL (ref 0.0–0.1)
Basophils Relative: 1 %
Eosinophils Absolute: 0.5 10*3/uL (ref 0.0–0.5)
Eosinophils Relative: 5 %
HCT: 45.3 % (ref 39.0–52.0)
Hemoglobin: 15.4 g/dL (ref 13.0–17.0)
Immature Granulocytes: 1 %
Lymphocytes Relative: 17 %
Lymphs Abs: 1.9 10*3/uL (ref 0.7–4.0)
MCH: 32.4 pg (ref 26.0–34.0)
MCHC: 34 g/dL (ref 30.0–36.0)
MCV: 95.4 fL (ref 80.0–100.0)
Monocytes Absolute: 0.8 10*3/uL (ref 0.1–1.0)
Monocytes Relative: 8 %
Neutro Abs: 7.4 10*3/uL (ref 1.7–7.7)
Neutrophils Relative %: 68 %
Platelets: 176 10*3/uL (ref 150–400)
RBC: 4.75 MIL/uL (ref 4.22–5.81)
RDW: 12.6 % (ref 11.5–15.5)
WBC: 10.7 10*3/uL — ABNORMAL HIGH (ref 4.0–10.5)
nRBC: 0 % (ref 0.0–0.2)

## 2021-07-13 LAB — RESP PANEL BY RT-PCR (FLU A&B, COVID) ARPGX2
Influenza A by PCR: NEGATIVE
Influenza B by PCR: NEGATIVE
SARS Coronavirus 2 by RT PCR: NEGATIVE

## 2021-07-13 LAB — LIPID PANEL
Cholesterol: 132 mg/dL (ref 0–200)
HDL: 39 mg/dL — ABNORMAL LOW (ref >40)
LDL Cholesterol: 76 mg/dL (ref 0–99)
Total CHOL/HDL Ratio: 3.4 RATIO
Triglycerides: 83 mg/dL (ref <150)
VLDL: 17 mg/dL (ref 0–40)

## 2021-07-13 LAB — HEMOGLOBIN A1C
Hgb A1c MFr Bld: 5.2 % (ref 4.8–5.6)
Mean Plasma Glucose: 102.54 mg/dL

## 2021-07-13 LAB — PROTIME-INR
INR: 1.1 (ref 0.8–1.2)
Prothrombin Time: 13.9 seconds (ref 11.4–15.2)

## 2021-07-13 LAB — HEPARIN LEVEL (UNFRACTIONATED): Heparin Unfractionated: <0.1 IU/mL — ABNORMAL LOW (ref 0.30–0.70)

## 2021-07-13 LAB — APTT: aPTT: 32 seconds (ref 24–36)

## 2021-07-13 SURGERY — INVASIVE LAB ABORTED CASE

## 2021-07-13 MED ORDER — HEPARIN BOLUS VIA INFUSION
3000.0000 [IU] | Freq: Once | INTRAVENOUS | Status: AC
  Administered 2021-07-13: 3000 [IU] via INTRAVENOUS
  Filled 2021-07-13: qty 3000

## 2021-07-13 MED ORDER — NITROGLYCERIN 1 MG/10 ML FOR IR/CATH LAB
INTRA_ARTERIAL | Status: AC
  Filled 2021-07-13: qty 10

## 2021-07-13 MED ORDER — ROSUVASTATIN CALCIUM 20 MG PO TABS
20.0000 mg | ORAL_TABLET | Freq: Every day | ORAL | Status: DC
  Administered 2021-07-13: 20 mg via ORAL
  Filled 2021-07-13: qty 1

## 2021-07-13 MED ORDER — METOPROLOL TARTRATE 12.5 MG HALF TABLET
12.5000 mg | ORAL_TABLET | Freq: Two times a day (BID) | ORAL | Status: DC
  Administered 2021-07-13 – 2021-07-14 (×4): 12.5 mg via ORAL
  Filled 2021-07-13 (×4): qty 1

## 2021-07-13 MED ORDER — METOPROLOL SUCCINATE ER 25 MG PO TB24: 12.5000 mg | ORAL_TABLET | Freq: Every day | ORAL | Status: DC

## 2021-07-13 MED ORDER — ACETAMINOPHEN 325 MG PO TABS: 650.0000 mg | ORAL_TABLET | Freq: Four times a day (QID) | ORAL | Status: DC | PRN

## 2021-07-13 MED ORDER — SODIUM CHLORIDE 0.9% FLUSH
3.0000 mL | Freq: Two times a day (BID) | INTRAVENOUS | Status: DC
  Administered 2021-07-14 – 2021-07-16 (×5): 3 mL via INTRAVENOUS

## 2021-07-13 MED ORDER — HEPARIN (PORCINE) 25000 UT/250ML-% IV SOLN
1250.0000 [IU]/h | INTRAVENOUS | Status: DC
  Administered 2021-07-13: 950 [IU]/h via INTRAVENOUS
  Administered 2021-07-13: 800 [IU]/h via INTRAVENOUS
  Administered 2021-07-15: 1250 [IU]/h via INTRAVENOUS
  Filled 2021-07-13 (×3): qty 250

## 2021-07-13 MED ORDER — SODIUM CHLORIDE 0.9% FLUSH: 3.0000 mL | INTRAVENOUS | Status: DC | PRN

## 2021-07-13 MED ORDER — ASPIRIN 81 MG PO CHEW
81.0000 mg | CHEWABLE_TABLET | ORAL | Status: AC
  Administered 2021-07-15: 81 mg via ORAL
  Filled 2021-07-13: qty 1

## 2021-07-13 MED ORDER — ONDANSETRON HCL 4 MG/2ML IJ SOLN: 4.0000 mg | Freq: Four times a day (QID) | INTRAMUSCULAR | Status: DC | PRN

## 2021-07-13 MED ORDER — ROSUVASTATIN CALCIUM 20 MG PO TABS
40.0000 mg | ORAL_TABLET | Freq: Every day | ORAL | Status: DC
  Administered 2021-07-14 – 2021-07-16 (×2): 40 mg via ORAL
  Filled 2021-07-13 (×2): qty 2

## 2021-07-13 MED ORDER — SODIUM CHLORIDE 0.9 % IV SOLN: INTRAVENOUS | Status: DC

## 2021-07-13 MED ORDER — SODIUM CHLORIDE 0.9 % IV SOLN: 250.0000 mL | INTRAVENOUS | Status: DC | PRN

## 2021-07-13 MED ORDER — ASPIRIN 81 MG PO CHEW
81.0000 mg | CHEWABLE_TABLET | Freq: Every day | ORAL | Status: DC
  Administered 2021-07-14: 81 mg via ORAL
  Filled 2021-07-13: qty 1

## 2021-07-13 SURGICAL SUPPLY — 5 items
KIT HEART LEFT (KITS) ×3 IMPLANT
PACK CARDIAC CATHETERIZATION (CUSTOM PROCEDURE TRAY) ×3 IMPLANT
SYR MEDRAD MARK 7 150ML (SYRINGE) ×3 IMPLANT
TRANSDUCER W/STOPCOCK (MISCELLANEOUS) ×3 IMPLANT
TUBING CIL FLEX 10 FLL-RA (TUBING) ×3 IMPLANT

## 2021-07-13 NOTE — Progress Notes (Signed)
ANTICOAGULATION CONSULT NOTE - Initial Consult  Pharmacy Consult for heparin Indication: chest pain/ACS  Allergies  Allergen Reactions   Lipitor [Atorvastatin] Other (See Comments)    "Makes pt feel funky"    Patient Measurements: Height: 5\' 6"  (167.6 cm) Weight: 63.6 kg (140 lb 3.2 oz) IBW/kg (Calculated) : 63.8 Heparin dosing wt: 64 kg  Vital Signs: Temp: 97.8 F (36.6 C) (10/29 1915) Temp Source: Oral (10/29 1915) BP: 118/70 (10/29 1915) Pulse Rate: 61 (10/29 1915)  Labs: Recent Labs    07/13/21 0303 07/13/21 0450 07/13/21 0733 07/13/21 1555  HGB 15.4  --   --   --   HCT 45.3  --   --   --   PLT 176  --   --   --   APTT 32  --   --   --   LABPROT 13.9  --   --   --   INR 1.1  --   --   --   HEPARINUNFRC  --   --   --  <0.10*  CREATININE 0.97  --   --   --   TROPONINIHS 40* 178* 647*  --      Estimated Creatinine Clearance: 59.2 mL/min (by C-G formula based on SCr of 0.97 mg/dL).   Medical History: Past Medical History:  Diagnosis Date   Coronary artery disease    Erectile dysfunction    History of hiatal hernia    a lone time ago   Hyperlipidemia    [place] fracture [facility]) 03/22/2020   Myocardial infarction ([facility])    x 2   Polio     Assessment: 75yo male c/o waking up to CP radiating to LUE that felt similar to prior MI, code STEMI initially called in the field and canceled on arrival, to begin heparin.  Heparin level < 0.1 is subtherapeutic on 800 units/hr. Pending heart cath on 10/31. Will re-bolus and increase rate.   Goal of Therapy:  Heparin level 0.3-0.7 units/ml Monitor platelets by anticoagulation protocol: Yes   Plan:  Repeat Heparin 3000 units IV bolus and increase infusion to 950 units/hr  F/u 6hr HLF Monitor daily HL, CBC/plt Monitor for signs/symptoms of bleeding   11/31, PharmD, BCPS, BCCP Clinical Pharmacist  Please check AMION for all [facility] phone numbers After 10:00 PM, call Main Pharmacy [phone]

## 2021-07-13 NOTE — Consult Note (Signed)
Cardiology Consultation:   Patient ID: [name] [name]. MRN: [id number]; DOB: 03/15/1946  Admit date: 07/13/2021 Date of Consult: 07/13/2021  PCP:  Gwenlyn Fudge, FNP   [facility] Providers Cardiologist:  Kristeen Miss, MD   {    Patient Profile:   [name] Weckerly. is a 75 y.o. 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 Loney Loh.  History of Present Illness:   [name] [name] presented to the ED 07/13/2021 with complaints of sudden onset left-sided chest tightness which woke him from sleep around 12:30 AM.  He noted radiation of pain to the left arm.  He took 4 baby aspirin's but pain persisted.  He noted associated nausea but no vomiting or shortness of breath.  He activated EMS and received morphine and nitroglycerin in route with resolution of pain.  He was last evaluated by cardiology at an outpatient visit with Dr. Elease Hashimoto 01/2020.  He was without anginal complaints at that time.  No medication changes occurred and he was recommended to follow-up in 1 year. His last ischemic evaluation was a LHC in 2020 which showed severe single-vessel CAD with occluded rPL which was felt to be the culprit lesion however PCI aborted due to inability to cross the lesion; otherwise he had mild to moderate nonobstructive CAD with patent stents in mid LAD and distal RCA with mild to moderate in-stent restenosis.  EF was felt to be moderately reduced at that time with estimation 35-45% with basal and mid inferior akinesis noted on LV gram.  Echocardiogram same day in 2020 showed EF 45-50%, indeterminate LV diastolic function, moderate hypokinesis of the LV inferior lateral wall, akinesis of the basal and mid inferoseptal and inferior walls, normal RV size/function, and no significant valvular  abnormalities.  Hospital course: VSS.  Labs notable for electrolytes WNL, Cr 0.97, WBC 10.7, Hgb 15.4, PLT 176, HgbA1c 5.2, LDL 76, and HsTrop 40> 178. EKG showed sinus rhythm, rate 78 bpm, PVCs, LVH with repol abnormalities, nonspecific IVCD, no STE/D.  Chest x-ray showed emphysema but no acute findings.  He was admitted to medicine and started on IV heparin for NSTEMI.  An echocardiogram is pending.  Cardiology asked to evaluate.  At the time of this evaluation patient is chest pain-free.  He reports this morning as the first episode of chest pain he has had an 2 years.  He reports pain felt similar to angina experienced with prior MI.  Pain lasted several hours and ultimately resolved with nitroglycerin and morphine.  He has not had any recurrent symptoms since arriving to the ED.  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  He has been fighting with insurance companies recently to get approval for a dental procedure which has been stressful.  He states the stress has made him consider resuming smoking again.  He reports quitting smoking 10 years ago.  No complaints of palpitations, dizziness, lightheadedness, syncope, orthopnea, PND, lower extremity edema.  Past Medical History:  Diagnosis Date   Coronary artery disease    Erectile dysfunction    History of hiatal hernia    a lone time ago   Hyperlipidemia    [place] fracture [facility]) 03/22/2020   Myocardial infarction (HCC)  x 2   Polio     Past Surgical History:  Procedure Laterality Date   APPENDECTOMY     CARDIAC CATHETERIZATION     has two stents placed   CERVICAL SPINE SURGERY     c3-4, 4-5,  5-6, 8 screws and plates   CORONARY STENT PLACEMENT     forein body removal     metal sharp removed from right leg   LEFT HEART CATH AND CORONARY ANGIOGRAPHY N/A 11/15/2018   Procedure: LEFT HEART CATH AND CORONARY ANGIOGRAPHY;  Surgeon: Yvonne Kendall, MD;  Location: [facility]  INVASIVE CV LAB;  Service: Cardiovascular;  Laterality: N/A;   MANDIBULAR HARDWARE REMOVAL Bilateral 04/23/2020   Procedure: MANDIBULAR HARDWARE REMOVAL;  Surgeon: Serena Colonel, MD;  Location: [facility];  Service: ENT;  Laterality: Bilateral;   ORIF MANDIBULAR FRACTURE N/A 03/14/2020   Procedure: OPEN REDUCTION INTERNAL FIXATION (ORIF) MID  FACE FRACTURE, MANDIBULAR FIXATION MODIFIED ARCH BARS;  Surgeon: Serena Colonel, MD;  Location: [facility] OR;  Service: ENT;  Laterality: N/A;   SCAR REVISION N/A 04/23/2020   Procedure: SCAR REVISION/ Upper Lip Repair;  Surgeon: Serena Colonel, MD;  Location: [facility];  Service: ENT;  Laterality: N/A;     Home Medications:  Prior to Admission medications   Medication Sig Start Date End Date Taking? Authorizing Provider  aspirin 81 MG chewable tablet Chew 81 mg by mouth daily.   Yes [provider]  cholecalciferol (VITAMIN D3) 25 MCG (1000 UNIT) tablet Take 1,000 Units by mouth daily.    Yes [provider]  HYDROcodone-acetaminophen (NORCO) 7.5-325 MG tablet Take 1 tablet by mouth daily as needed for moderate pain. 04/04/21  Yes [name] F, FNP  ibuprofen (ADVIL) 200 MG tablet Take 400 mg by mouth every 6 (six) hours as needed for moderate pain.   Yes [provider]  nitroGLYCERIN (NITROSTAT) 0.4 MG SL tablet Place 1 tablet (0.4 mg total) under the tongue every 5 (five) minutes as needed for chest pain. 11/16/18 07/13/21 Yes Simmons, Brittainy M, PA-C  rosuvastatin (CRESTOR) 20 MG tablet Take 20 mg by mouth daily. 01/07/21  Yes [provider]  tadalafil (CIALIS) 10 MG tablet Take 1-2 tablets (10-20 mg total) by mouth daily as needed. Patient taking differently: Take 10-20 mg by mouth daily as needed for erectile dysfunction. 01/04/21  Yes [name] F, FNP    Inpatient Medications: Scheduled Meds:  [START ON 07/14/2021] aspirin  81 mg Oral Daily   metoprolol tartrate  12.5 mg Oral BID   [START  ON 07/14/2021] rosuvastatin  40 mg Oral Daily   Continuous Infusions:  heparin 800 Units/hr (07/13/21 0451)   PRN Meds: acetaminophen, ondansetron (ZOFRAN) IV  Allergies:    Allergies  Allergen Reactions   Lipitor [Atorvastatin] Other (See Comments)    "Makes pt feel funky"    Social History:   Social History   Socioeconomic History   Marital status: Divorced    Spouse name: Not on file   Number of children: Not on file   Years of education: Not on file   Highest education level: Not on file  Occupational History   Not on file  Tobacco Use   Smoking status: Former   Smokeless tobacco: Never  Vaping Use   Vaping Use: Never used  Substance and Sexual Activity   Alcohol use: Yes    Alcohol/week: 21.0 standard drinks    Types: 21 Cans of beer per week    Comment: 2  beers daily   Drug use: No   Sexual activity: Not on file  Other Topics Concern   Not on file  Social History Narrative   [name] [name] has a restraining order against one of his sons.   Social Determinants of Health   Financial Resource Strain: Low Risk    Difficulty of Paying Living Expenses: Not very hard  Food Insecurity: Food Insecurity Present   Worried About Running Out of Food in the Last Year: Sometimes true   Ran Out of Food in the Last Year: Never true  Transportation Needs: No Transportation Needs   Lack of Transportation (Medical): No   Lack of Transportation (Non-Medical): No  Physical Activity: Sufficiently Active   Days of Exercise per Week: 7 days   Minutes of Exercise per Session: 40 min  Stress: Stress Concern Present   Feeling of Stress : Rather much  Social Connections: Moderately Integrated   Frequency of Communication with Friends and Family: More than three times a week   Frequency of Social Gatherings with Friends and Family: More than three times a week   Attends Religious Services: More than 4 times per year   Active Member of [employer] or Organizations: Yes   Attends Museum/gallery exhibitions officer: More than 4 times per year   Marital Status: Divorced  Catering manager Violence: Not At Risk   Fear of Current or Ex-Partner: No   Emotionally Abused: No   Physically Abused: No   Sexually Abused: No    Family History:    Family History  Problem Relation Age of Onset   Diabetes Mother    Heart disease Mother    Heart disease Father      ROS:  Please see the history of present illness.   All other ROS reviewed and negative.     Physical Exam/Data:   Vitals:   07/13/21 0308 07/13/21 0700 07/13/21 0715 07/13/21 0800  BP:  111/75 116/76 109/79  Pulse:  63 62 61  Resp:  Temp:      TempSrc:      SpO2:  91% 94% 91%  Weight: 64.4 kg     Height:  (1.676 m)      No intake or output data in the 24 hours ending 07/13/21 1000 Last 3 Weights 07/13/2021 07/09/2021 04/04/2021  Weight (lbs) 142 lb 143 lb 140 lb 12.8 oz  Weight (kg) 64.411 kg 64.864 kg 63.866 kg     Body mass index is 22.92 kg/m.  General:  Thin elderly gentleman sitting upright on hospital gurney in NAD HEENT: sclera anicteric; poor dentition Neck: no JVD Vascular: No carotid bruits; Distal pulses 2+ bilaterally Cardiac:  distant heart sounds, normal S1, S2; RRR; no murmurs, rubs, or gallops  Lungs:  decreased breath sounds throughout  Abd: soft, nontender, no hepatomegaly  Ext: no edema Musculoskeletal:  No deformities, BUE and BLE strength normal and equal Skin: warm and dry  Neuro:  CNs 2-12 intact, no focal abnormalities noted Psych:  Normal affect   EKG:  The EKG was personally reviewed and demonstrates:  sinus rhythm, rate 78 bpm, PVCs, LVH with repol abnormalities, nonspecific IVCD, no STE/D.   Relevant CV Studies:  Echocardiogram 2020: 1. The left ventricle has mildly reduced systolic function, with an  ejection fraction of 45-50%. The cavity size was normal. Left ventricular  diastology could not be evaluated due to nondiagnostic images.   2. Moderate  hypokinesis of the left ventricular inferolateral wall.  There  is akinesis of the basal ad mid inferoseptal and inferior walls.   3. The right ventricle has normal systolic function. The cavity was  normal. There is no increase in right ventricular wall thickness. Right  ventricular systolic pressure could not be assessed.   4. The mitral valve is normal in structure.   5. The tricuspid valve is normal in structure.   6. The pulmonic valve was normal in structure.   7. The aortic valve is tricuspid Mild sclerosis of the aortic valve.   LHC 2020: Conclusions: Significant single-vessel coronary artery disease with occluded rPL branch in the AV groove likely representing the culprit lesion. Mild to moderate, non-obstructive CAD involving the LAD and LCx. Patent stents in the mid LAD and distal RCA with mild to moderate in-stent restenosis. Moderately reduced left ventricular systolic function (LVEF 35-45%) with basal and mid inferior akinesis and normal left ventricular filling pressure. Aborted PCI to rPL due to inability to cross the lesion with a balloon despite aggressive guide catheter and wire support.  Wiring of the lesion resulted in restoration of TIMI-2 flow with 99% residual stenosis.   Recommendations: Restart heparin infusion 2 hours after deflation of TR band to complete 48-72 hours of IV heparin from the time of admission. Dual antiplatelet therapy with aspirin and ticagrelor for 12 months. Aggressive secondary prevention.  Diagnostic Dominance: Right Intervention     Laboratory Data:  High Sensitivity Troponin:   Recent Labs  Lab 07/13/21 0303 07/13/21 0450  TROPONINIHS 40* 178*     Chemistry Recent Labs  Lab 07/09/21 1030 07/13/21 0303  NA 140 139  K 5.2 4.6  CL 104 104  CO2 24 28  GLUCOSE 87 119*  BUN 14 21  CREATININE 0.87 0.97  CALCIUM 9.5 9.3  GFRNONAA  --  >60  ANIONGAP  --  7    Recent Labs  Lab 07/09/21 1030 07/13/21 0303  PROT 6.4 6.2*   ALBUMIN 4.1 3.5  AST 24 23  ALT 23 23  ALKPHOS 72 57  BILITOT 0.4 0.8   Lipids  Recent Labs  Lab 07/13/21 0303  CHOL 132  TRIG 83  HDL 39*  LDLCALC 76  CHOLHDL 3.4    Hematology Recent Labs  Lab 07/13/21 0303  WBC 10.7*  RBC 4.75  HGB 15.4  HCT 45.3  MCV 95.4  MCH 32.4  MCHC 34.0  RDW 12.6  PLT 176   Thyroid No results for input(s): TSH, FREET4 in the last 168 hours.  BNPNo results for input(s): BNP, PROBNP in the last 168 hours.  DDimer No results for input(s): DDIMER in the last 168 hours.   Radiology/Studies:  DG Chest Port 1 View  Result Date: 07/13/2021 CLINICAL DATA:  Chest pain EXAM: PORTABLE CHEST 1 VIEW COMPARISON:  03/13/2020 FINDINGS: Paucity of vasculature within the right lung apex is in keeping with asymmetric changes of emphysema. Lungs are clear. No pneumothorax or pleural effusion. Cardiac size within normal limits. No acute bone abnormality. IMPRESSION: No active disease. Emphysema. Electronically Signed   By: Helyn Numbers M.D.   On: 07/13/2021 03:35     Assessment and Plan:   1. NSTEMI inpatient with known history of CAD with prior remote stenting to LAD and RCA: Patient presented with sudden onset chest pain that awoke him from sleep around 12:30 AM.  He had associated radiation of pain to the left arm and nausea without vomiting.  Pain ultimately subsided after receiving IV morphine and nitroglycerin in route  to ED.  EKG with LVH and repol abnormalities but nonischemic. HsTrop 40> 178.  He is on IV heparin.  Echocardiogram pending.  He remains chest pain-free - Will plan for LHC on Monday to reevaluate coronary anatomy. - Shared Decision Making/Informed Consent{ The risks [stroke (1 in 1000), death (1 in 1000), kidney failure [usually temporary] (1 in 500), bleeding (1 in 200), allergic reaction [possibly serious] (1 in 200)], benefits (diagnostic support and management of coronary artery disease) and alternatives of a cardiac catheterization  were discussed in detail with [name] [name] and he is willing to proceed. - Will follow up echocardiogram results to reevaluate LV function - Continue IV heparin - Continue aspirin and statin  2. Chronic combined CHF/ICM: Patient with EF 45-50% on last echo in 2020.  Not on beta-blocker due to soft [name] pressures.  He has no volume overload complaints and chest x-ray is without acute findings. - Await repeat echocardiogram to reevaluate LV function - Will add low dose metoprolol tartrate 12.5mg  twice daily -plan to consolidate to succinate at discharge if tolerated - Continue to monitor strict I & O's and daily weights  3. HTN: BP well controlled without use of antihypertensive agents. - Continue to monitor  4. HLD: LDL 76 this admission on Crestor 20 mg daily prior to arrival. - Will increase crestor to 40mg  daily - Will need repeat FLP/LFTs in 6-8 weeks if tolerated. Low threshold to refer to lipid clinic if intolerant or LDL not at goal on next repeat labs.   5.  Suspected COPD: Has known smoking history though quit 10 years ago.  Evidence of emphysema on chest x-ray.  Does not appear to have formal COPD diagnosis. - Defer management to primary team - Continue to encourage abstinence from tobacco    Risk Assessment/Risk Scores:   TIMI Risk Score for Unstable Angina or Non-ST Elevation MI:   The patient's TIMI risk score is 5, which indicates a 26% risk of all cause mortality, new or recurrent myocardial infarction or need for urgent revascularization in the next 14 days.{   New York Heart Association (NYHA) Functional Class NYHA Class I        For questions or updates, please contact [facility] Please consult [email or web address] for contact info under    Signed, Beatriz Stallion, PA-C  07/13/2021 10:00 AM As above, patient seen and examined.  Briefly he is a 75 year old male with past medical history of coronary artery disease, hyperlipidemia admitted with non-ST elevation  myocardial infarction.  Also with history of ischemic cardiomyopathy with ejection fraction on most recent echocardiogram in March 2020 45 to 50%.  Patient developed substernal chest tightness and left hand tingling at midnight last night.  Symptoms are similar to his previous infarct pain.  No associated symptoms.  He took aspirin and the pain persisted.  He was given nitroglycerin by EMS and the pain resolved.  Has had no further chest pain since that time.  Cardiology now asked to evaluate. Electrocardiogram shows sinus rhythm with PVCs, cannot rule out prior septal infarct, prior inferior infarct. Creatinine is 0.97, hemoglobin 15.4.  Troponin is 40 and 178.  1 non-ST elevation myocardial infarction-patient is presently pain-free.  Troponins are abnormal and symptoms are similar to his previous infarct pain.  Plan to treat with aspirin, heparin and statin.  Add metoprolol 12.5 mg twice daily.  Plan will be to proceed with cardiac catheterization on Monday.  The risk and benefits including myocardial infarction, CVA and death discussed and  he agrees to proceed.  2 hyperlipidemia-continue Crestor.  3 mild ischemic cardiomyopathy-we are adding low-dose metoprolol.  We will transition to Toprol at discharge.  Olga Millers, MD

## 2021-07-13 NOTE — Progress Notes (Signed)
ANTICOAGULATION CONSULT NOTE - Initial Consult  Pharmacy Consult for heparin Indication: chest pain/ACS  Allergies  Allergen Reactions   Lipitor [Atorvastatin] Other (See Comments)    "Makes pt feel funky"    Patient Measurements: Height: 5\' 6"  (167.6 cm) Weight: 64.4 kg (142 lb) IBW/kg (Calculated) : 63.8  Vital Signs: Temp: 97.4 F (36.3 C) (10/29 0303) Temp Source: Oral (10/29 0303) BP: 116/85 (10/29 0303) Pulse Rate: 72 (10/29 0303)  Labs: Recent Labs    07/13/21 0303  HGB 15.4  HCT 45.3  PLT 176  APTT 32  LABPROT 13.9  INR 1.1  CREATININE 0.97  TROPONINIHS 40*    Estimated Creatinine Clearance: 59.4 mL/min (by C-G formula based on SCr of 0.97 mg/dL).   Medical History: Past Medical History:  Diagnosis Date   Coronary artery disease    Erectile dysfunction    History of hiatal hernia    a lone time ago   Hyperlipidemia    [place] fracture [facility]) 03/22/2020   Myocardial infarction (HCC)    x 2   Polio     Assessment: 75yo male c/o waking up to CP radiating to LUE that felt similar to prior MI, code STEMI initially called in the field and canceled on arrival, to begin heparin.  Goal of Therapy:  Heparin level 0.3-0.7 units/ml Monitor platelets by anticoagulation protocol: Yes   Plan:  Heparin 3000 units IV bolus x1 followed by infusion at 800 units/hr and monitor heparin levels and CBC.  75yo, PharmD, BCPS  07/13/2021,4:21 AM

## 2021-07-13 NOTE — Progress Notes (Addendum)
PROGRESS NOTE        PATIENT DETAILS Name: [name] [name]. Age: 75 y.o. Sex: male Date of Birth: Jun 09, 1946 Admit Date: 07/13/2021 Admitting Physician John Giovanni, MD XIP:JASNK, Robin Searing, FNP  Brief Narrative: Patient is a 75 y.o. male with history of CAD, HFrEF, HTN, HLD-who presented with atypical chest pain.  Found to have non-STEMI.  See below for further details.  Subjective: Lying comfortably in bed-denies any chest pain or shortness of breath.  Objective: Vitals: Blood pressure (!) 133/106, pulse 82, temperature (!) 97.4 F (36.3 C), temperature source Oral, resp. rate 16, height 5\' 6"  (1.676 m), weight 64.4 kg, SpO2 97 %.   Exam: Gen Exam:Alert awake-not in any distress HEENT:atraumatic, normocephalic Chest: B/L clear to auscultation anteriorly CVS:S1S2 regular Abdomen:soft non tender, non distended Extremities:no edema Neurology: Non focal Skin: no rash  Pertinent Labs/Radiology: Troponin: 40 >> 178 >> 647 WBC:  10/29>>CXR: No PNA   Assessment/Plan: Non-STEMI: Chest pain-free-continue aspirin/statin/IV heparin/beta-blocker-await echo-LHC planned for 10/31.Cards following  HFrEF: Volume status stable-follow closely.  HLD: Continue statin  Procedures: None Consults:Cardiology DVT Prophylaxis: IV Heparin Code Status:Full code  Family Communication: None at bedside  Time spent: 35 minutes-Greater than 50% of this time was spent in counseling, explanation of diagnosis, planning of further management, and coordination of care.  Diet: Diet Order             Diet Heart Room service appropriate? Yes; Fluid consistency: Thin  Diet effective now                      Disposition Plan: Status is: Inpatient  Remains inpatient appropriate because: Non-STEMI-on IV heparin infusion-for LHC on Monday.   Antimicrobial agents: Anti-infectives (From admission, onward)    None        MEDICATIONS: Scheduled  Meds:  [START ON 07/14/2021] aspirin  81 mg Oral Daily   metoprolol tartrate  12.5 mg Oral BID   [START ON 07/14/2021] rosuvastatin  40 mg Oral Daily   Continuous Infusions:  heparin 800 Units/hr (07/13/21 0451)   PRN Meds:.acetaminophen, ondansetron (ZOFRAN) IV   I have personally reviewed following labs and imaging studies  LABORATORY DATA: CBC: Recent Labs  Lab 07/13/21 0303  WBC 10.7*  NEUTROABS 7.4  HGB 15.4  HCT 45.3  MCV 95.4  PLT 176    Basic Metabolic Panel: Recent Labs  Lab 07/09/21 1030 07/13/21 0303  NA 140 139  K 5.2 4.6  CL 104 104  CO2 24 28  GLUCOSE 87 119*  BUN 14 21  CREATININE 0.87 0.97  CALCIUM 9.5 9.3    GFR: Estimated Creatinine Clearance: 59.4 mL/min (by C-G formula based on SCr of 0.97 mg/dL).  Liver Function Tests: Recent Labs  Lab 07/09/21 1030 07/13/21 0303  AST 24 23  ALT 23 23  ALKPHOS 72 57  BILITOT 0.4 0.8  PROT 6.4 6.2*  ALBUMIN 4.1 3.5   No results for input(s): LIPASE, AMYLASE in the last 168 hours. No results for input(s): AMMONIA in the last 168 hours.  Coagulation Profile: Recent Labs  Lab 07/13/21 0303  INR 1.1    Cardiac Enzymes: No results for input(s): CKTOTAL, CKMB, CKMBINDEX, TROPONINI in the last 168 hours.  BNP (last 3 results) No results for input(s): PROBNP in the last 8760 hours.  Lipid Profile: Recent Labs  07/13/21 0303  CHOL 132  HDL 39*  LDLCALC 76  TRIG 83  CHOLHDL 3.4    Thyroid Function Tests: No results for input(s): TSH, T4TOTAL, FREET4, T3FREE, THYROIDAB in the last 72 hours.  Anemia Panel: No results for input(s): VITAMINB12, FOLATE, FERRITIN, TIBC, IRON, RETICCTPCT in the last 72 hours.  Urine analysis: No results found for: COLORURINE, APPEARANCEUR, LABSPEC, PHURINE, GLUCOSEU, HGBUR, BILIRUBINUR, KETONESUR, PROTEINUR, UROBILINOGEN, NITRITE, LEUKOCYTESUR  Sepsis Labs: Lactic Acid, Venous No results found for: LATICACIDVEN  MICROBIOLOGY: Recent Results (from  the past 240 hour(s))  Resp Panel by RT-PCR (Flu A&B, Covid) Nasopharyngeal Swab     Status: None   Collection Time: 07/13/21  3:03 AM   Specimen: Nasopharyngeal Swab; Nasopharyngeal(NP) swabs in vial transport medium  Result Value Ref Range Status   SARS Coronavirus 2 by RT PCR NEGATIVE NEGATIVE Final    Comment: (NOTE) SARS-CoV-2 target nucleic acids are NOT DETECTED.  The SARS-CoV-2 RNA is generally detectable in upper respiratory specimens during the acute phase of infection. The lowest concentration of SARS-CoV-2 viral copies this assay can detect is 138 copies/mL. A negative result does not preclude SARS-Cov-2 infection and should not be used as the sole basis for treatment or other patient management decisions. A negative result may occur with  improper specimen collection/handling, submission of specimen other than nasopharyngeal swab, presence of viral mutation(s) within the areas targeted by this assay, and inadequate number of viral copies(<138 copies/mL). A negative result must be combined with clinical observations, patient history, and epidemiological information. The expected result is Negative.  Fact Sheet for Patients:  BloggerCourse.com  Fact Sheet for Healthcare Providers:  SeriousBroker.it  This test is no t yet approved or cleared by the [place] FDA and  has been authorized for detection and/or diagnosis of SARS-CoV-2 by FDA under an Emergency Use Authorization (EUA). This EUA will remain  in effect (meaning this test can be used) for the duration of the COVID-19 declaration under Section 564(b)(1) of the Act, 21 U.S.C.section 360bbb-3(b)(1), unless the authorization is terminated  or revoked sooner.       Influenza A by PCR NEGATIVE NEGATIVE Final   Influenza B by PCR NEGATIVE NEGATIVE Final    Comment: (NOTE) The Xpert Xpress SARS-CoV-2/FLU/RSV plus assay is intended as an aid in the diagnosis of  influenza from Nasopharyngeal swab specimens and should not be used as a sole basis for treatment. Nasal washings and aspirates are unacceptable for Xpert Xpress SARS-CoV-2/FLU/RSV testing.  Fact Sheet for Patients: BloggerCourse.com  Fact Sheet for Healthcare Providers: SeriousBroker.it  This test is not yet approved or cleared by the [place] FDA and has been authorized for detection and/or diagnosis of SARS-CoV-2 by FDA under an Emergency Use Authorization (EUA). This EUA will remain in effect (meaning this test can be used) for the duration of the COVID-19 declaration under Section 564(b)(1) of the Act, 21 U.S.C. section 360bbb-3(b)(1), unless the authorization is terminated or revoked.  Performed at [facility], [address], [place], [place] [address]     RADIOLOGY STUDIES/RESULTS: DG Chest Port 1 View  Result Date: 07/13/2021 CLINICAL DATA:  Chest pain EXAM: PORTABLE CHEST 1 VIEW COMPARISON:  03/13/2020 FINDINGS: Paucity of vasculature within the right lung apex is in keeping with asymmetric changes of emphysema. Lungs are clear. No pneumothorax or pleural effusion. Cardiac size within normal limits. No acute bone abnormality. IMPRESSION: No active disease. Emphysema. Electronically Signed   By: Helyn Numbers M.D.   On: 07/13/2021  03:35     LOS: 0 days   Jeoffrey Massed, MD  [facility]    To contact the attending provider between 7A-7P or the covering provider during after hours 7P-7A, please log into the web site [email or web address] and access using universal [place] password for that web site. If you do not have the password, please call the hospital operator.  07/13/2021, 10:43 AM

## 2021-07-13 NOTE — ED Notes (Signed)
STEMI activation:  Patient chest pain free and EKG c/w with LVH.  Discussed with ED attending, will defer emergent coronary angiography at this time.

## 2021-07-13 NOTE — ED Provider Notes (Signed)
[facility] EMERGENCY DEPARTMENT Provider Note   CSN: [id number] Arrival date & time: 07/13/21  0257     History Chief Complaint  Patient presents with   Code STEMI    [name] [name]. is a 75 y.o. male.  Patient presents to the emergency department for evaluation of chest pain.  Patient reports that he woke up around 12:30 AM with left arm and left-sided chest pain.  Patient reports that he took 2 low-dose aspirin followed by 2 more low-dose aspirin and then for low-dose aspirin over a period of 1 to 2 hours.  Pain was moderate and persistent, associated with some shortness of breath.  Patient reports that this feels like when he has had a heart attack in the past.  Patient was administered nitroglycerin by EMS and his pain has now resolved.  EMS activated a code STEMI.      Past Medical History:  Diagnosis Date   Coronary artery disease    Erectile dysfunction    History of hiatal hernia    a lone time ago   Hyperlipidemia    [place] fracture [facility]) 03/22/2020   Myocardial infarction (HCC)    x 2   [place]     Patient Active Problem List   Diagnosis Date Noted   NSTEMI (non-ST elevated myocardial infarction) (HCC) 07/13/2021   Controlled substance agreement signed 01/07/2021   [name] [facility]) 03/22/2020   History of non-ST elevation myocardial infarction (NSTEMI)    Spondylolisthesis at L4-L5 level 06/08/2017   Coronary artery disease involving native coronary artery of native heart without angina pectoris 05/08/2017   Mixed hyperlipidemia 05/08/2017   Erectile dysfunction 03/29/2013    Past Surgical History:  Procedure Laterality Date   APPENDECTOMY     CARDIAC CATHETERIZATION     has two stents placed   CERVICAL SPINE SURGERY     c3-4, 4-5,  5-6, 8 screws and plates   CORONARY STENT PLACEMENT     forein body removal     metal sharp removed from right leg   LEFT HEART CATH AND CORONARY ANGIOGRAPHY N/A 11/15/2018   Procedure: LEFT HEART  CATH AND CORONARY ANGIOGRAPHY;  Surgeon: Yvonne Kendall, MD;  Location: [facility] INVASIVE [facility] LAB;  Service: Cardiovascular;  Laterality: N/A;   MANDIBULAR HARDWARE REMOVAL Bilateral 04/23/2020   Procedure: MANDIBULAR HARDWARE REMOVAL;  Surgeon: Serena Colonel, MD;  Location: [facility];  Service: ENT;  Laterality: Bilateral;   ORIF MANDIBULAR FRACTURE N/A 03/14/2020   Procedure: OPEN REDUCTION INTERNAL FIXATION (ORIF) MID  FACE FRACTURE, MANDIBULAR FIXATION MODIFIED ARCH BARS;  Surgeon: Serena Colonel, MD;  Location: [facility] OR;  Service: ENT;  Laterality: N/A;   SCAR REVISION N/A 04/23/2020   Procedure: SCAR REVISION/ Upper Lip Repair;  Surgeon: Serena Colonel, MD;  Location: [facility];  Service: ENT;  Laterality: N/A;       Family History  Problem Relation Age of Onset   Diabetes Mother    Heart disease Mother    Heart disease Father     Social History   Tobacco Use   Smoking status: Former   Smokeless tobacco: Never  Vaping Use   Vaping Use: Never used  Substance Use Topics   Alcohol use: Yes    Alcohol/week: 21.0 standard drinks    Types: 21 Cans of beer per week    Comment: 2 beers daily   Drug use: No    Home Medications Prior to Admission medications  Medication Sig Start Date End Date Taking? Authorizing Provider  aspirin 81 MG chewable tablet Chew 81 mg by mouth daily.   Yes [provider]  cholecalciferol (VITAMIN D3) 25 MCG (1000 UNIT) tablet Take 1,000 Units by mouth daily.    Yes [provider]  HYDROcodone-acetaminophen (NORCO) 7.5-325 MG tablet Take 1 tablet by mouth daily as needed for moderate pain. 04/04/21  Yes [name] F, FNP  ibuprofen (ADVIL) 200 MG tablet Take 400 mg by mouth every 6 (six) hours as needed for moderate pain.   Yes [provider]  nitroGLYCERIN (NITROSTAT) 0.4 MG SL tablet Place 1 tablet (0.4 mg total) under the tongue every 5 (five) minutes as needed for chest pain. 11/16/18 07/13/21 Yes  Simmons, Brittainy M, PA-C  rosuvastatin (CRESTOR) 20 MG tablet Take 20 mg by mouth daily. 01/07/21  Yes [provider]  tadalafil (CIALIS) 10 MG tablet Take 1-2 tablets (10-20 mg total) by mouth daily as needed. Patient taking differently: Take 10-20 mg by mouth daily as needed for erectile dysfunction. 01/04/21  Yes Gwenlyn Fudge, FNP    Allergies    Lipitor [atorvastatin]  Review of Systems   Review of Systems  Respiratory:  Positive for shortness of breath.   Cardiovascular:  Positive for chest pain.  All other systems reviewed and are negative.  Physical Exam Updated Vital Signs BP 116/85   Pulse 72   Temp (!) 97.4 F (36.3 C) (Oral)   Resp 17   Ht 5\' 6"  (1.676 m)   Wt 64.4 kg   SpO2 90%   BMI 22.92 kg/m   Physical Exam Vitals and nursing note reviewed.  Constitutional:      General: He is not in acute distress.    Appearance: Normal appearance. He is well-developed.  HENT:     Head: Normocephalic and atraumatic.     Right Ear: Hearing normal.     Left Ear: Hearing normal.     Nose: Nose normal.  Eyes:     Conjunctiva/sclera: Conjunctivae normal.     Pupils: Pupils are equal, round, and reactive to light.  Cardiovascular:     Rate and Rhythm: Regular rhythm.     Heart sounds: S1 normal and S2 normal. No murmur heard.   No friction rub. No gallop.  Pulmonary:     Effort: Pulmonary effort is normal. No respiratory distress.     Breath sounds: Normal breath sounds.  Chest:     Chest wall: No tenderness.  Abdominal:     General: Bowel sounds are normal.     Palpations: Abdomen is soft.     Tenderness: There is no abdominal tenderness. There is no guarding or rebound. Negative signs include Murphy's sign and McBurney's sign.     Hernia: No hernia is present.  Musculoskeletal:        General: Normal range of motion.     Cervical back: Normal range of motion and neck supple.  Skin:    General: Skin is warm and dry.     Findings: No rash.   Neurological:     Mental Status: He is alert and oriented to person, place, and time.     GCS: GCS eye subscore is 4. GCS verbal subscore is 5. GCS motor subscore is 6.     Cranial Nerves: No cranial nerve deficit.     Sensory: No sensory deficit.     Coordination: Coordination normal.  Psychiatric:        Speech: Speech  normal.        Behavior: Behavior normal.        Thought Content: Thought content normal.    ED Results / Procedures / Treatments   Labs (all labs ordered are listed, but only abnormal results are displayed) Labs Reviewed  CBC WITH DIFFERENTIAL/PLATELET - Abnormal; Notable for the following components:      Result Value   WBC 10.7 (*)    All other components within normal limits  COMPREHENSIVE METABOLIC PANEL - Abnormal; Notable for the following components:   Glucose, Bld 119 (*)    Total Protein 6.2 (*)    All other components within normal limits  LIPID PANEL - Abnormal; Notable for the following components:   HDL 39 (*)    All other components within normal limits  TROPONIN I (HIGH SENSITIVITY) - Abnormal; Notable for the following components:   Troponin I (High Sensitivity) 40 (*)    All other components within normal limits  RESP PANEL BY RT-PCR (FLU A&B, COVID) ARPGX2  HEMOGLOBIN A1C  PROTIME-INR  APTT  HEPARIN LEVEL (UNFRACTIONATED)  TROPONIN I (HIGH SENSITIVITY)    EKG EKG Interpretation  Date/Time:  Saturday July 13 2021 03:01:58 EDT Ventricular Rate:  78 PR Interval:  163 QRS Duration: 119 QT Interval:  382 QTC Calculation: 436 R Axis:   -66 Text Interpretation: Sinus rhythm Paired ventricular premature complexes Probable left atrial enlargement Nonspecific IVCD with LAD Left ventricular hypertrophy with repolarization abnormality Confirmed by Gilda Crease [phone]) on 07/13/2021 3:05:53 AM  Radiology DG Chest Port 1 View  Result Date: 07/13/2021 CLINICAL DATA:  Chest pain EXAM: PORTABLE CHEST 1 VIEW COMPARISON:   03/13/2020 FINDINGS: Paucity of vasculature within the right lung apex is in keeping with asymmetric changes of emphysema. Lungs are clear. No pneumothorax or pleural effusion. Cardiac size within normal limits. No acute bone abnormality. IMPRESSION: No active disease. Emphysema. Electronically Signed   By: Helyn Numbers M.D.   On: 07/13/2021 03:35    Procedures Procedures   Medications Ordered in ED Medications  heparin ADULT infusion 100 units/mL (25000 units/249mL) (800 Units/hr Intravenous New Bag/Given 07/13/21 0451)  heparin bolus via infusion 3,000 Units (3,000 Units Intravenous Bolus from Bag 07/13/21 0452)    ED Course  I have reviewed the triage vital signs and the nursing notes.  Pertinent labs & imaging results that were available during my care of the patient were reviewed by me and considered in my medical decision making (see chart for details).    MDM Rules/Calculators/A&P                           Patient presents as a code STEMI.  EMS activated prior to arrival.  Patient had onset of chest pain earlier tonight.  He has taken multiple doses of aspirin.  Patient reports that he is now pain-free after being given nitroglycerin.  Patient indicates that the pain feels very similar to when he has had a heart attack in the past.  Patient does have 2 stents.  EKG reviewed on arrival was consistent with LVH, no STEMI noted.  Cardiology reviewed the patient's case and EKG, canceled the code STEMI.  Patient continues to be pain-free.  First troponin is elevated at 40.  Presentation is consistent with NSTEMI.  Patient started on heparin.  Will be admitted to hospitalist service.  Cardiology plans for nonemergent cardiac catheterization on Monday.  CRITICAL CARE Performed by: Gilda Crease  Total critical care time: 33 minutes  Critical care time was exclusive of separately billable procedures and treating other patients.  Critical care was necessary to treat or  prevent imminent or life-threatening deterioration.  Critical care was time spent personally by me on the following activities: development of treatment plan with patient and/or surrogate as well as nursing, discussions with consultants, evaluation of patient's response to treatment, examination of patient, obtaining history from patient or surrogate, ordering and performing treatments and interventions, ordering and review of laboratory studies, ordering and review of radiographic studies, pulse oximetry and re-evaluation of patient's condition.  Final Clinical Impression(s) / ED Diagnoses Final diagnoses:  NSTEMI (non-ST elevated myocardial infarction) [facility])    Rx / DC Orders ED Discharge Orders     None        Blaiden Werth, Canary Brim, MD 07/13/21 [phone]

## 2021-07-13 NOTE — ED Triage Notes (Signed)
Pt bib [facility] as a code STEMI. Pt woke up around midnight from numbness and tingling in his left arm radiating to his left side. Pt took a total of 8 aspirin prior to EMS arrival. Pt has hx MI x3 and 2 stents. EMS EKG showed STEMI, EMS gave 1 nitro and 4mg  morphine. 16G LAC placed PTA.  [place] [address], RR 14, O2 96%H, HR 78

## 2021-07-13 NOTE — H&P (Signed)
History and Physical    [name] [name]. ZYS:[id number] DOB: 10-Nov-1945 [name]: 07/13/2021  PCP: Gwenlyn Fudge, FNP Patient coming from: Home  Chief Complaint: Chest pain  HPI: [name] [name]. is a 75 y.o. male with medical history significant of CAD with stents, prior MIs, hyperlipidemia presented to the ED as code STEMI.  Patient took multiple doses of aspirin prior to EMS arrival.  He was given nitroglycerin and morphine by EMS after which chest pain resolved.  EKG done on arrival showed LVH, no STEMI.  Code STEMI canceled by cardiology.  Initial high-sensitivity troponin 40, repeat pending.  Patient started on IV heparin for NSTEMI.  Cardiology plans for nonemergent cardiac catheterization on Monday.  Patient states he normally never experiences chest pain.  He had his annual physical on Monday and was feeling fine.  However, tonight at 12:30 AM he woke up from his sleep with left-sided chest tightness and pain in his left arm.  He took 2 baby aspirin's but the pain continued so he took another 2 baby aspirin's.  Later took another 4 baby aspirin's but pain continued and also radiated to his shoulders and opposite arm.  He felt nauseous but did not vomit.  No shortness of breath.  Pain finally resolved after he received nitroglycerin and morphine by EMS.  Currently chest pain-free.  Does report history of 4 prior heart attacks and states he has 2 cardiac stents.  No other complaints.  Review of Systems:  All systems reviewed and apart from history of presenting illness, are negative.  Past Medical History:  Diagnosis Date   Coronary artery disease    Erectile dysfunction    History of hiatal hernia    a lone time ago   Hyperlipidemia    [place] fracture [facility]) 03/22/2020   Myocardial infarction (HCC)    x 2   [place]     Past Surgical History:  Procedure Laterality Date   APPENDECTOMY     CARDIAC CATHETERIZATION     has two stents placed   CERVICAL SPINE SURGERY     c3-4,  4-5,  5-6, 8 screws and plates   CORONARY STENT PLACEMENT     forein body removal     metal sharp removed from right leg   LEFT HEART CATH AND CORONARY ANGIOGRAPHY N/A 11/15/2018   Procedure: LEFT HEART CATH AND CORONARY ANGIOGRAPHY;  Surgeon: Yvonne Kendall, MD;  Location: [facility];  Service: Cardiovascular;  Laterality: N/A;   MANDIBULAR HARDWARE REMOVAL Bilateral 04/23/2020   Procedure: MANDIBULAR HARDWARE REMOVAL;  Surgeon: Serena Colonel, MD;  Location: [facility];  Service: ENT;  Laterality: Bilateral;   ORIF MANDIBULAR FRACTURE N/A 03/14/2020   Procedure: OPEN REDUCTION INTERNAL FIXATION (ORIF) MID  FACE FRACTURE, MANDIBULAR FIXATION MODIFIED ARCH BARS;  Surgeon: Serena Colonel, MD;  Location: [facility] OR;  Service: ENT;  Laterality: N/A;   SCAR REVISION N/A 04/23/2020   Procedure: SCAR REVISION/ Upper Lip Repair;  Surgeon: Serena Colonel, MD;  Location: [facility];  Service: ENT;  Laterality: N/A;     reports that he has quit smoking. He has never used smokeless tobacco. He reports current alcohol use of about 21.0 standard drinks per week. He reports that he does not use drugs.  Allergies  Allergen Reactions   Lipitor [Atorvastatin] Other (See Comments)    "Makes pt feel funky"    Family History  Problem Relation Age of Onset   Diabetes Mother  Heart disease Mother    Heart disease Father     Prior to Admission medications   Medication Sig Start Date End Date Taking? Authorizing Provider  aspirin 81 MG chewable tablet Chew 81 mg by mouth daily.   Yes [provider]  cholecalciferol (VITAMIN D3) 25 MCG (1000 UNIT) tablet Take 1,000 Units by mouth daily.    Yes [provider]  HYDROcodone-acetaminophen (NORCO) 7.5-325 MG tablet Take 1 tablet by mouth daily as needed for moderate pain. 04/04/21  Yes [name] F, FNP  ibuprofen (ADVIL) 200 MG tablet Take 400 mg by mouth every 6 (six) hours as needed for moderate pain.   Yes  [provider]  nitroGLYCERIN (NITROSTAT) 0.4 MG SL tablet Place 1 tablet (0.4 mg total) under the tongue every 5 (five) minutes as needed for chest pain. 11/16/18 07/13/21 Yes Simmons, Brittainy M, PA-C  rosuvastatin (CRESTOR) 20 MG tablet Take 20 mg by mouth daily. 01/07/21  Yes [provider]  tadalafil (CIALIS) 10 MG ta  Take 1-2 tablets (10-20 mg total) by mouth daily as needed. Patient taking differently: Take 10-20 mg by mouth daily as needed for erectile dysfunction. 01/04/21  Yes [name] F, FNP    Physical Exam: Vitals:   07/13/21 0303 07/13/21 0308  BP: 116/85   Pulse: 72   Resp: 17   Temp: (!) 97.4 F (36.3 C)   TempSrc: Oral   SpO2: 90%   Weight:  64.4 kg  Height:  5\' 6"  (1.676 m)    Physical Exam Constitutional:      General: He is not in acute distress. HENT:     Head: Normocephalic and atraumatic.  Eyes:     Extraocular Movements: Extraocular movements intact.     Conjunctiva/sclera: Conjunctivae normal.  Cardiovascular:     Rate and Rhythm: Normal rate and regular rhythm.     Pulses: Normal pulses.  Pulmonary:     Effort: Pulmonary effort is normal. No respiratory distress.     Breath sounds: Normal breath sounds. No wheezing or rales.  Abdominal:     General: Bowel sounds are normal. There is no distension.     Palpations: Abdomen is soft.     Tenderness: There is no abdominal tenderness.  Musculoskeletal:        General: No swelling or tenderness.     Cervical back: Normal range of motion and neck supple.  Skin:    General: Skin is warm and dry.  Neurological:     General: No focal deficit present.     Mental Status: He is alert and oriented to person, place, and time.     Labs on Admission: I have personally reviewed following labs and imaging studies  CBC: Recent Labs  Lab 07/13/21 0303  WBC 10.7*  NEUTROABS 7.4  HGB 15.4  HCT 45.3  MCV 95.4  PLT 176   Basic Metabolic Panel: Recent Labs  Lab 07/09/21 1030  07/13/21 0303  NA 140 139  K 5.2 4.6  CL 104 104  CO2 24 28  GLUCOSE 87 119*  BUN 14 21  CREATININE 0.87 0.97  CALCIUM 9.5 9.3   GFR: Estimated Creatinine Clearance: 59.4 mL/min (by C-G formula based on SCr of 0.97 mg/dL). Liver Function Tests: Recent Labs  Lab 07/09/21 1030 07/13/21 0303  AST 24 23  ALT 23 23  ALKPHOS 72 57  BILITOT 0.4 0.8  PROT 6.4 6.2*  ALBUMIN 4.1 3.5   No results for input(s): LIPASE, AMYLASE in  the last 168 hours. No results for input(s): AMMONIA in the last 168 hours. Coagulation Profile: Recent Labs  Lab 07/13/21 0303  INR 1.1   Cardiac Enzymes: No results for input(s): CKTOTAL, CKMB, CKMBINDEX, TROPONINI in the last 168 hours. BNP (last 3 results) No results for input(s): PROBNP in the last 8760 hours. HbA1C: Recent Labs    07/13/21 0303  HGBA1C 5.2   CBG: No results for input(s): GLUCAP in the last 168 hours. Lipid Profile: Recent Labs    07/13/21 0303  CHOL 132  HDL 39*  LDLCALC 76  TRIG 83  CHOLHDL 3.4   Thyroid Function Tests: No results for input(s): TSH, T4TOTAL, FREET4, T3FREE, THYROIDAB in the last 72 hours. Anemia Panel: No results for input(s): VITAMINB12, FOLATE, FERRITIN, TIBC, IRON, RETICCTPCT in the last 72 hours. Urine analysis: No results found for: COLORURINE, APPEARANCEUR, LABSPEC, PHURINE, GLUCOSEU, HGBUR, BILIRUBINUR, KETONESUR, PROTEINUR, UROBILINOGEN, NITRITE, LEUKOCYTESUR  Radiological Exams on Admission: DG Chest Port 1 View  Result Date: 07/13/2021 CLINICAL DATA:  Chest pain EXAM: PORTABLE CHEST 1 VIEW COMPARISON:  03/13/2020 FINDINGS: Paucity of vasculature within the right lung apex is in keeping with asymmetric changes of emphysema. Lungs are clear. No pneumothorax or pleural effusion. Cardiac size within normal limits. No acute bone abnormality. IMPRESSION: No active disease. Emphysema. Electronically Signed   By: Helyn Numbers M.D.   On: 07/13/2021 03:35    Assessment/Plan Principal  Problem:   NSTEMI (non-ST elevated myocardial infarction) [facility]) Active Problems:   Mixed hyperlipidemia   CAD (coronary artery disease)  NSTEMI Known CAD with stents and history concerning for anginal pain.  Initial high-sensitivity troponin 40.  EKG showing LVH, no STEMI.  Patient took multiple doses of aspirin at home.  Chest pain resolved after he received nitroglycerin and morphine by EMS.  Cardiology plans for nonemergent cardiac catheterization on Monday. -Cardiac monitoring.  Continue IV heparin.  Trend troponin.  Echocardiogram ordered.  CAD -Already took multiple doses of aspirin today, continue home aspirin starting from tomorrow.  Continue statin.  Hyperlipidemia LDL 76. -Continue Crestor  DVT prophylaxis: Heparin Code Status: Patient wishes to be full code. Family Communication: No family available at this time. Disposition Plan: Status is: Inpatient  Remains inpatient appropriate because: NSTEMI  Level of care:  Level of care: Progressive  The medical decision making on this patient was of high complexity and the patient is at high risk for clinical deterioration, therefore this is a level 3 visit.  John Giovanni MD [facility]  If 7PM-7AM, please contact night-coverage [email or web address]  07/13/2021, 6:33 AM

## 2021-07-14 ENCOUNTER — Inpatient Hospital Stay (HOSPITAL_COMMUNITY): Payer: Medicare Other

## 2021-07-14 ENCOUNTER — Encounter: Payer: Self-pay | Admitting: Family Medicine

## 2021-07-14 DIAGNOSIS — I214 Non-ST elevation (NSTEMI) myocardial infarction: Secondary | ICD-10-CM | POA: Diagnosis not present

## 2021-07-14 DIAGNOSIS — E782 Mixed hyperlipidemia: Secondary | ICD-10-CM | POA: Diagnosis not present

## 2021-07-14 DIAGNOSIS — I25119 Atherosclerotic heart disease of native coronary artery with unspecified angina pectoris: Secondary | ICD-10-CM | POA: Diagnosis not present

## 2021-07-14 LAB — CBC
HCT: 43.4 % (ref 39.0–52.0)
Hemoglobin: 14.2 g/dL (ref 13.0–17.0)
MCH: 31.8 pg (ref 26.0–34.0)
MCHC: 32.7 g/dL (ref 30.0–36.0)
MCV: 97.1 fL (ref 80.0–100.0)
Platelets: 168 10*3/uL (ref 150–400)
RBC: 4.47 MIL/uL (ref 4.22–5.81)
RDW: 12.9 % (ref 11.5–15.5)
WBC: 7.6 10*3/uL (ref 4.0–10.5)
nRBC: 0 % (ref 0.0–0.2)

## 2021-07-14 LAB — ECHOCARDIOGRAM COMPLETE
Area-P 1/2: 3.93 cm2
Height: 66 in
S' Lateral: 4.7 cm
Weight: 2243.2 oz

## 2021-07-14 LAB — HEPARIN LEVEL (UNFRACTIONATED)
Heparin Unfractionated: 0.39 IU/mL (ref 0.30–0.70)
Heparin Unfractionated: 0.13 IU/mL — ABNORMAL LOW (ref 0.30–0.70)
Heparin Unfractionated: 0.24 IU/mL — ABNORMAL LOW (ref 0.30–0.70)

## 2021-07-14 LAB — BASIC METABOLIC PANEL
Anion gap: 4 — ABNORMAL LOW (ref 5–15)
BUN: 20 mg/dL (ref 8–23)
CO2: 25 mmol/L (ref 22–32)
Calcium: 8.8 mg/dL — ABNORMAL LOW (ref 8.9–10.3)
Chloride: 108 mmol/L (ref 98–111)
Creatinine, Ser: 0.85 mg/dL (ref 0.61–1.24)
GFR, Estimated: >60 mL/min (ref >60)
Glucose, Bld: 107 mg/dL — ABNORMAL HIGH (ref 70–99)
Potassium: 4.4 mmol/L (ref 3.5–5.1)
Sodium: 137 mmol/L (ref 135–145)

## 2021-07-14 MED ORDER — HYDROCODONE-ACETAMINOPHEN 7.5-325 MG PO TABS
1.0000 | ORAL_TABLET | Freq: Every day | ORAL | 0 refills | Status: DC | PRN
Start: 2021-07-14 — End: 2021-07-16

## 2021-07-14 MED ORDER — HYDROCODONE-ACETAMINOPHEN 7.5-325 MG PO TABS: 1.0000 | ORAL_TABLET | Freq: Every day | ORAL | 0 refills | Status: DC | PRN

## 2021-07-14 NOTE — Plan of Care (Addendum)
Dicussed with patient plan of care for the evening, pain management and procedure with some teach back displayed.  Inform consent signed; groin clipped and bracelet changed to the left hand.  No video available at this time but patient had no questions about the procedure.  Problem: Education: Goal: Knowledge of General Education information will improve Description: Including pain rating scale, medication(s)/side effects and non-pharmacologic comfort measures Outcome: Progressing   Problem: Health Behavior/Discharge Planning: Goal: Ability to manage health-related needs will improve Outcome: Progressing

## 2021-07-14 NOTE — Progress Notes (Signed)
PROGRESS NOTE        PATIENT DETAILS Name: [name] [name]. Age: 75 y.o. Sex: male Date of Birth: August 19, 1946 Admit Date: 07/13/2021 Admitting Physician John Giovanni, MD YBF:XOVAN, Robin Searing, FNP  Brief Narrative: Patient is a 75 y.o. male with history of CAD, HFrEF, HTN, HLD-who presented with atypical chest pain.  Found to have non-STEMI.  See below for further details.  Subjective: No chest pain overnight.  Lying comfortably in bed.  Objective: Vitals: Blood pressure 124/83, pulse 60, temperature 97.6 F (36.4 C), resp. rate 18, height 5\' 6"  (1.676 m), weight 63.6 kg, SpO2 94 %.   Exam: Gen Exam:Alert awake-not in any distress HEENT:atraumatic, normocephalic Chest: B/L clear to auscultation anteriorly CVS:S1S2 regular Abdomen:soft non tender, non distended Extremities:no edema Neurology: Non focal Skin: no rash   Pertinent Labs/Radiology: Creatinine: 0.85 Hb: 14.2  10/29>>CXR: No PNA   Assessment/Plan: Non-STEMI: No further chest pain-on aspirin/beta-blocker/statin/IV heparin-await echo-cardiology following with plans for [facility] tomorrow.    HFrEF: Remains euvolemic-continue to follow closely.  HLD: Continue statin  Procedures: None Consults:Cardiology DVT Prophylaxis: IV Heparin Code Status:Full code  Family Communication: None at bedside  Time spent: 35 minutes-Greater than 50% of this time was spent in counseling, explanation of diagnosis, planning of further management, and coordination of care.  Diet: Diet Order             Diet NPO time specified Except for: Sips with Meds  Diet effective midnight           Diet Heart Room service appropriate? Yes; Fluid consistency: Thin  Diet effective now                      Disposition Plan: Status is: Inpatient  Remains inpatient appropriate because: Non-STEMI-on IV heparin infusion-for [facility] on Monday.   Antimicrobial agents: Anti-infectives (From admission,  onward)    None        MEDICATIONS: Scheduled Meds:  aspirin  81 mg Oral Daily   [START ON 07/15/2021] aspirin  81 mg Oral Pre-Cath   metoprolol tartrate  12.5 mg Oral BID   rosuvastatin  40 mg Oral Daily   sodium chloride flush  3 mL Intravenous Q12H   Continuous Infusions:  sodium chloride     [START ON 07/15/2021] sodium chloride     heparin 950 Units/hr (07/13/21 2351)   PRN Meds:.sodium chloride, acetaminophen, ondansetron (ZOFRAN) IV, sodium chloride flush   I have personally reviewed following labs and imaging studies  LABORATORY DATA: CBC: Recent Labs  Lab 07/13/21 0303 07/14/21 0206  WBC 10.7* 7.6  NEUTROABS 7.4  --   HGB 15.4 14.2  HCT 45.3 43.4  MCV 95.4 97.1  PLT 176 168     Basic Metabolic Panel: Recent Labs  Lab 07/09/21 1030 07/13/21 0303 07/14/21 0206  NA 140 139 137  K 5.2 4.6 4.4  CL 104 104 108  CO2 24 28 25   GLUCOSE 87 119* 107*  BUN 14 21 20   CREATININE 0.87 0.97 0.85  CALCIUM 9.5 9.3 8.8*     GFR: Estimated Creatinine Clearance: 67.5 mL/min (by C-G formula based on SCr of 0.85 mg/dL).  Liver Function Tests: Recent Labs  Lab 07/09/21 1030 07/13/21 0303  AST 24 23  ALT 23 23  ALKPHOS 72 57  BILITOT 0.4 0.8  PROT 6.4 6.2*  ALBUMIN  4.1 3.5    No results for input(s): LIPASE, AMYLASE in the last 168 hours. No results for input(s): AMMONIA in the last 168 hours.  Coagulation Profile: Recent Labs  Lab 07/13/21 0303  INR 1.1     Cardiac Enzymes: No results for input(s): CKTOTAL, CKMB, CKMBINDEX, TROPONINI in the last 168 hours.  BNP (last 3 results) No results for input(s): PROBNP in the last 8760 hours.  Lipid Profile: Recent Labs    07/13/21 0303  CHOL 132  HDL 39*  LDLCALC 76  TRIG 83  CHOLHDL 3.4     Thyroid Function Tests: No results for input(s): TSH, T4TOTAL, FREET4, T3FREE, THYROIDAB in the last 72 hours.  Anemia Panel: No results for input(s): VITAMINB12, FOLATE, FERRITIN, TIBC, IRON,  RETICCTPCT in the last 72 hours.  Urine analysis: No results found for: COLORURINE, APPEARANCEUR, LABSPEC, PHURINE, GLUCOSEU, HGBUR, BILIRUBINUR, KETONESUR, PROTEINUR, UROBILINOGEN, NITRITE, LEUKOCYTESUR  Sepsis Labs: Lactic Acid, Venous No results found for: LATICACIDVEN  MICROBIOLOGY: Recent Results (from the past 240 hour(s))  Resp Panel by RT-PCR (Flu A&B, Covid) Nasopharyngeal Swab     Status: None   Collection Time: 07/13/21  3:03 AM   Specimen: Nasopharyngeal Swab; Nasopharyngeal(NP) swabs in vial transport medium  Result Value Ref Range Status   SARS Coronavirus 2 by RT PCR NEGATIVE NEGATIVE Final    Comment: (NOTE) SARS-CoV-2 target nucleic acids are NOT DETECTED.  The SARS-CoV-2 RNA is generally detectable in upper respiratory specimens during the acute phase of infection. The lowest concentration of SARS-CoV-2 viral copies this assay can detect is 138 copies/mL. A negative result does not preclude SARS-Cov-2 infection and should not be used as the sole basis for treatment or other patient management decisions. A negative result may occur with  improper specimen collection/handling, submission of specimen other than nasopharyngeal swab, presence of viral mutation(s) within the areas targeted by this assay, and inadequate number of viral copies(<138 copies/mL). A negative result must be combined with clinical observations, patient history, and epidemiological information. The expected result is Negative.  Fact Sheet for Patients:  BloggerCourse.com  Fact Sheet for Healthcare Providers:  SeriousBroker.it  This test is no t yet approved or cleared by the [place] FDA and  has been authorized for detection and/or diagnosis of SARS-CoV-2 by FDA under an Emergency Use Authorization (EUA). This EUA will remain  in effect (meaning this test can be used) for the duration of the COVID-19 declaration under Section  564(b)(1) of the Act, 21 U.S.C.section 360bbb-3(b)(1), unless the authorization is terminated  or revoked sooner.       Influenza A by PCR NEGATIVE NEGATIVE Final   Influenza B by PCR NEGATIVE NEGATIVE Final    Comment: (NOTE) The Xpert Xpress SARS-CoV-2/FLU/RSV plus assay is intended as an aid in the diagnosis of influenza from Nasopharyngeal swab specimens and should not be used as a sole basis for treatment. Nasal washings and aspirates are unacceptable for Xpert Xpress SARS-CoV-2/FLU/RSV testing.  Fact Sheet for Patients: BloggerCourse.com  Fact Sheet for Healthcare Providers: SeriousBroker.it  This test is not yet approved or cleared by the [place] FDA and has been authorized for detection and/or diagnosis of SARS-CoV-2 by FDA under an Emergency Use Authorization (EUA). This EUA will remain in effect (meaning this test can be used) for the duration of the COVID-19 declaration under Section 564(b)(1) of the Act, 21 U.S.C. section 360bbb-3(b)(1), unless the authorization is terminated or revoked.  Performed at [facility], [address] [place], [place]  [address]     RADIOLOGY STUDIES/RESULTS: DG Chest Port 1 View  Result Date: 07/13/2021 CLINICAL DATA:  Chest pain EXAM: PORTABLE CHEST 1 VIEW COMPARISON:  03/13/2020 FINDINGS: Paucity of vasculature within the right lung apex is in keeping with asymmetric changes of emphysema. Lungs are clear. No pneumothorax or pleural effusion. Cardiac size within normal limits. No acute bone abnormality. IMPRESSION: No active disease. Emphysema. Electronically Signed   By: Helyn Numbers M.D.   On: 07/13/2021 03:35     LOS: 1 day   Jeoffrey Massed, MD  [facility]    To contact the attending provider between 7A-7P or the covering provider during after hours 7P-7A, please log into the web site [email or web address] and access using universal [place] password for that  web site. If you do not have the password, please call the hospital operator.  07/14/2021, 11:17 AM

## 2021-07-14 NOTE — Progress Notes (Signed)
Desats to 87-88 with deep sleep, 2 l mainatained overnight sats maintained  between 96-98%.

## 2021-07-14 NOTE — Progress Notes (Signed)
ANTICOAGULATION CONSULT NOTE  Pharmacy Consult for heparin Indication: chest pain/ACS  Allergies  Allergen Reactions   Lipitor [Atorvastatin] Other (See Comments)    "Makes pt feel funky"    Patient Measurements: Height: 5\' 6"  (167.6 cm) Weight: 63.6 kg (140 lb 3.2 oz) IBW/kg (Calculated) : 63.8 Heparin dosing wt: 64 kg  Vital Signs: Temp: 97.6 F (36.4 C) (10/30 1927) Temp Source: Oral (10/30 1927) BP: 125/88 (10/30 2127) Pulse Rate: 66 (10/30 2126)  Labs: Recent Labs    07/13/21 0303 07/13/21 0450 07/13/21 0733 07/13/21 1555 07/14/21 0206 07/14/21 1034 07/14/21 2203  HGB 15.4  --   --   --  14.2  --   --   HCT 45.3  --   --   --  43.4  --   --   PLT 176  --   --   --  168  --   --   APTT 32  --   --   --   --   --   --   LABPROT 13.9  --   --   --   --   --   --   INR 1.1  --   --   --   --   --   --   HEPARINUNFRC  --   --   --    < > 0.39 0.13* 0.24*  CREATININE 0.97  --   --   --  0.85  --   --   TROPONINIHS 40* 178* 647*  --   --   --   --    < > = values in this interval not displayed.     Estimated Creatinine Clearance: 67.5 mL/min (by C-G formula based on SCr of 0.85 mg/dL).   Assessment: 75yo male c/o waking up to CP radiating to LUE that felt similar to prior MI, code STEMI initially called in the field and canceled on arrival, to begin heparin.  Heparin level remains subtherapeutic (0.24) on 1100 units/hr. Pending heart cath on 10/31. No issues with line or bleeding reported per RN.  Goal of Therapy:  Heparin level 0.3-0.7 units/ml Monitor platelets by anticoagulation protocol: Yes   Plan:  Increase Heparin infusion to 1250 units/hr  F/u 8 hr heparin level  11/31, PharmD, BCPS Please see amion for complete clinical pharmacist phone list 07/14/2021 11:12 PM

## 2021-07-14 NOTE — Progress Notes (Signed)
ANTICOAGULATION CONSULT NOTE - Follow Up Consult  Pharmacy Consult for heparin Indication: NSTEMI  Labs: Recent Labs    07/13/21 0303 07/13/21 0450 07/13/21 0733 07/13/21 1555 07/14/21 0206  HGB 15.4  --   --   --  14.2  HCT 45.3  --   --   --  43.4  PLT 176  --   --   --  168  APTT 32  --   --   --   --   LABPROT 13.9  --   --   --   --   INR 1.1  --   --   --   --   HEPARINUNFRC  --   --   --  <0.10* 0.39  CREATININE 0.97  --   --   --  0.85  TROPONINIHS 40* 178* 647*  --   --     Assessment/Plan:  75yo male therapeutic on heparin after rate change. Will continue infusion at current rate of 950 units/hr and confirm stable with additional level.   Vernard Gambles, PharmD, BCPS  07/14/2021,2:49 AM

## 2021-07-14 NOTE — Progress Notes (Signed)
Progress Note  Patient Name: [name] [name]. Date of Encounter: 07/14/2021  [facility] Cardiologist: Mertie Moores, MD   Subjective   No CP or dyspnea  Inpatient Medications    Scheduled Meds:  aspirin  81 mg Oral Daily   [START ON 07/15/2021] aspirin  81 mg Oral Pre-Cath   metoprolol tartrate  12.5 mg Oral BID   rosuvastatin  40 mg Oral Daily   sodium chloride flush  3 mL Intravenous Q12H   Continuous Infusions:  sodium chloride     [START ON 07/15/2021] sodium chloride     heparin 950 Units/hr (07/13/21 2351)   PRN Meds: sodium chloride, acetaminophen, ondansetron (ZOFRAN) IV, sodium chloride flush   Vital Signs    Vitals:   07/14/21 0000 07/14/21 0416 07/14/21 0500 07/14/21 0747  BP: 107/71 (!) 141/88 133/84 128/70  Pulse: (!) 56 (!) 57 61 61  Resp: 14 13 15 14   Temp:  97.8 F (36.6 C)  97.6 F (36.4 C)  TempSrc:  Oral    SpO2: 90% 97% 96% 92%  Weight:      Height:        Intake/Output Summary (Last 24 hours) at 07/14/2021 0803 Last data filed at 07/14/2021 0751 Gross per 24 hour  Intake --  Output 825 ml  Net -825 ml   Last 3 Weights 07/13/2021 07/13/2021 07/09/2021  Weight (lbs) 140 lb 3.2 oz 142 lb 143 lb  Weight (kg) 63.594 kg 64.411 kg 64.864 kg      Telemetry    Sinus with rare PVC - Personally Reviewed   Physical Exam   GEN: No acute distress.   Neck: No JVD Cardiac: RRR Respiratory: Clear to auscultation bilaterally. GI: Soft, nontender, non-distended  MS: No edema Neuro:  Nonfocal  Psych: Normal affect   Labs    High Sensitivity Troponin:   Recent Labs  Lab 07/13/21 0303 07/13/21 0450 07/13/21 0733  TROPONINIHS 40* 178* 647*     Chemistry Recent Labs  Lab 07/09/21 1030 07/13/21 0303 07/14/21 0206  NA 140 139 137  K 5.2 4.6 4.4  CL 104 104 108  CO2 24 28 25   GLUCOSE 87 119* 107*  BUN 14 21 20   CREATININE 0.87 0.97 0.85  CALCIUM 9.5 9.3 8.8*  PROT 6.4 6.2*  --   ALBUMIN 4.1 3.5  --   AST 24 23  --    ALT 23 23  --   ALKPHOS 72 57  --   BILITOT 0.4 0.8  --   GFRNONAA  --  >60 >60  ANIONGAP  --  7 4*    Lipids  Recent Labs  Lab 07/13/21 0303  CHOL 132  TRIG 83  HDL 39*  LDLCALC 76  CHOLHDL 3.4    Hematology Recent Labs  Lab 07/13/21 0303 07/14/21 0206  WBC 10.7* 7.6  RBC 4.75 4.47  HGB 15.4 14.2  HCT 45.3 43.4  MCV 95.4 97.1  MCH 32.4 31.8  MCHC 34.0 32.7  RDW 12.6 12.9  PLT 176 168    Radiology    DG Chest Port 1 View  Result Date: 07/13/2021 CLINICAL DATA:  Chest pain EXAM: PORTABLE CHEST 1 VIEW COMPARISON:  03/13/2020 FINDINGS: Paucity of vasculature within the right lung apex is in keeping with asymmetric changes of emphysema. Lungs are clear. No pneumothorax or pleural effusion. Cardiac size within normal limits. No acute bone abnormality. IMPRESSION: No active disease. Emphysema. Electronically Signed   By: Linwood Dibbles.D.  On: 07/13/2021 03:35      Patient Profile     75 year old male with past medical history of coronary artery disease, hyperlipidemia admitted with non-ST elevation myocardial infarction.  Also with history of ischemic cardiomyopathy with ejection fraction on most recent echocardiogram in March 2020 45 to 50%.    Assessment & Plan    1 non-ST elevation myocardial infarction-patient remains pain-free.  Enzymes are abnormal.  Continue aspirin, heparin, statin and beta-blockade.  Plan is for cardiac catheterization tomorrow.  The risk and benefits previously discussed including microinfarction, CVA and death and he agreed to proceed.  2 history of mild ischemic cardiomyopathy-continue beta-blocker.  Follow-up echocardiogram is pending.  3 hyperlipidemia-continue statin.  For questions or updates, please contact [facility] Please consult [email or web address] for contact info under        Signed, Olga Millers, MD  07/14/2021, 8:03 AM

## 2021-07-14 NOTE — Plan of Care (Signed)
  Problem: Clinical Measurements: Goal: Will remain free from infection Outcome: Progressing   Problem: Clinical Measurements: Goal: Cardiovascular complication will be avoided Outcome: Progressing   

## 2021-07-14 NOTE — Progress Notes (Signed)
ANTICOAGULATION CONSULT NOTE  Pharmacy Consult for heparin Indication: chest pain/ACS  Allergies  Allergen Reactions   Lipitor [Atorvastatin] Other (See Comments)    "Makes pt feel funky"    Patient Measurements: Height: 5\' 6"  (167.6 cm) Weight: 63.6 kg (140 lb 3.2 oz) IBW/kg (Calculated) : 63.8 Heparin dosing wt: 64 kg  Vital Signs: Temp: 97.6 F (36.4 C) (10/30 1116) Temp Source: Oral (10/30 1116) BP: 124/83 (10/30 1116) Pulse Rate: 60 (10/30 1116)  Labs: Recent Labs    07/13/21 0303 07/13/21 0450 07/13/21 0733 07/13/21 1555 07/14/21 0206 07/14/21 1034  HGB 15.4  --   --   --  14.2  --   HCT 45.3  --   --   --  43.4  --   PLT 176  --   --   --  168  --   APTT 32  --   --   --   --   --   LABPROT 13.9  --   --   --   --   --   INR 1.1  --   --   --   --   --   HEPARINUNFRC  --   --   --  <0.10* 0.39 0.13*  CREATININE 0.97  --   --   --  0.85  --   TROPONINIHS 40* 178* 647*  --   --   --     Estimated Creatinine Clearance: 67.5 mL/min (by C-G formula based on SCr of 0.85 mg/dL).   Medical History: Past Medical History:  Diagnosis Date   Coronary artery disease    Erectile dysfunction    History of hiatal hernia    a lone time ago   Hyperlipidemia    [place] fracture [facility]) 03/22/2020   Myocardial infarction (HCC)    x 2   [place]     Assessment: 75yo male c/o waking up to CP radiating to LUE that felt similar to prior MI, code STEMI initially called in the field and canceled on arrival, to begin heparin.  Heparin level decreased to 0.13 is subtherapeutic on 950 units/hr. Pending heart cath on 10/31. CBC stable. No bleeding noted. Will increase rate.   Goal of Therapy:  Heparin level 0.3-0.7 units/ml Monitor platelets by anticoagulation protocol: Yes   Plan:  Increase Heparin infusion to 1100 units/hr  F/u 6hr HLF Monitor daily HL, CBC/plt Monitor for signs/symptoms of bleeding  For cath in AM  11/31, PharmD, BCPS, BCCCP Clinical  Pharmacist Please refer to [facility] for [facility] numbers

## 2021-07-15 ENCOUNTER — Encounter (HOSPITAL_COMMUNITY): Payer: Self-pay | Admitting: Cardiovascular Disease

## 2021-07-15 ENCOUNTER — Encounter (HOSPITAL_COMMUNITY)
Admission: EM | Disposition: A | Discharge status: Home / Self Care | Payer: Self-pay | Source: Home / Self Care | Attending: Internal Medicine

## 2021-07-15 DIAGNOSIS — I2510 Atherosclerotic heart disease of native coronary artery without angina pectoris: Secondary | ICD-10-CM | POA: Diagnosis not present

## 2021-07-15 DIAGNOSIS — I5022 Chronic systolic (congestive) heart failure: Secondary | ICD-10-CM

## 2021-07-15 DIAGNOSIS — I5020 Unspecified systolic (congestive) heart failure: Secondary | ICD-10-CM

## 2021-07-15 DIAGNOSIS — I25119 Atherosclerotic heart disease of native coronary artery with unspecified angina pectoris: Secondary | ICD-10-CM | POA: Diagnosis not present

## 2021-07-15 DIAGNOSIS — I214 Non-ST elevation (NSTEMI) myocardial infarction: Secondary | ICD-10-CM | POA: Diagnosis not present

## 2021-07-15 DIAGNOSIS — E782 Mixed hyperlipidemia: Secondary | ICD-10-CM | POA: Diagnosis not present

## 2021-07-15 HISTORY — PX: CORONARY STENT INTERVENTION: CATH118234

## 2021-07-15 HISTORY — PX: LEFT HEART CATH AND CORONARY ANGIOGRAPHY: CATH118249

## 2021-07-15 LAB — CBC
HCT: 43.8 % (ref 39.0–52.0)
Hemoglobin: 15.1 g/dL (ref 13.0–17.0)
MCH: 32.6 pg (ref 26.0–34.0)
MCHC: 34.5 g/dL (ref 30.0–36.0)
MCV: 94.6 fL (ref 80.0–100.0)
Platelets: 173 10*3/uL (ref 150–400)
RBC: 4.63 MIL/uL (ref 4.22–5.81)
RDW: 12.6 % (ref 11.5–15.5)
WBC: 7 10*3/uL (ref 4.0–10.5)
nRBC: 0 % (ref 0.0–0.2)

## 2021-07-15 LAB — POCT I-STAT 7, (LYTES, BLD GAS, ICA,H+H)
Acid-Base Excess: 0 mmol/L (ref 0.0–2.0)
Bicarbonate: 23.9 mmol/L (ref 20.0–28.0)
Calcium, Ion: 1.19 mmol/L (ref 1.15–1.40)
HCT: 41 % (ref 39.0–52.0)
Hemoglobin: 13.9 g/dL (ref 13.0–17.0)
O2 Saturation: 96 %
Potassium: 3.9 mmol/L (ref 3.5–5.1)
Sodium: 142 mmol/L (ref 135–145)
TCO2: 25 mmol/L (ref 22–32)
pCO2 arterial: 37.6 mmHg (ref 32.0–48.0)
pH, Arterial: 7.412 (ref 7.350–7.450)
pO2, Arterial: 84 mmHg (ref 83.0–108.0)

## 2021-07-15 LAB — HEPARIN LEVEL (UNFRACTIONATED): Heparin Unfractionated: 0.3 IU/mL (ref 0.30–0.70)

## 2021-07-15 LAB — BASIC METABOLIC PANEL
Anion gap: 6 (ref 5–15)
BUN: 17 mg/dL (ref 8–23)
CO2: 23 mmol/L (ref 22–32)
Calcium: 8.9 mg/dL (ref 8.9–10.3)
Chloride: 107 mmol/L (ref 98–111)
Creatinine, Ser: 0.89 mg/dL (ref 0.61–1.24)
GFR, Estimated: >60 mL/min (ref >60)
Glucose, Bld: 97 mg/dL (ref 70–99)
Potassium: 3.8 mmol/L (ref 3.5–5.1)
Sodium: 136 mmol/L (ref 135–145)

## 2021-07-15 LAB — SURGICAL PCR SCREEN
MRSA, PCR: NEGATIVE
Staphylococcus aureus: NEGATIVE

## 2021-07-15 LAB — POCT ACTIVATED CLOTTING TIME
Activated Clotting Time: 248 seconds
Activated Clotting Time: 306 seconds
Activated Clotting Time: 254 seconds
Activated Clotting Time: 214 seconds
Activated Clotting Time: 167 seconds

## 2021-07-15 SURGERY — LEFT HEART CATH AND CORONARY ANGIOGRAPHY
Anesthesia: LOCAL

## 2021-07-15 MED ORDER — HEPARIN SODIUM (PORCINE) 1000 UNIT/ML IJ SOLN
INTRAMUSCULAR | Status: AC
  Filled 2021-07-15: qty 1

## 2021-07-15 MED ORDER — VERAPAMIL HCL 2.5 MG/ML IV SOLN
INTRAVENOUS | Status: AC
  Filled 2021-07-15: qty 2

## 2021-07-15 MED ORDER — TICAGRELOR 90 MG PO TABS
90.0000 mg | ORAL_TABLET | Freq: Two times a day (BID) | ORAL | Status: DC
  Administered 2021-07-15 – 2021-07-16 (×2): 90 mg via ORAL
  Filled 2021-07-15 (×2): qty 1

## 2021-07-15 MED ORDER — TICAGRELOR 90 MG PO TABS
ORAL_TABLET | ORAL | Status: DC | PRN
  Administered 2021-07-15: 180 mg via ORAL

## 2021-07-15 MED ORDER — SODIUM CHLORIDE 0.9% FLUSH
3.0000 mL | Freq: Two times a day (BID) | INTRAVENOUS | Status: DC
  Administered 2021-07-15 – 2021-07-16 (×2): 3 mL via INTRAVENOUS

## 2021-07-15 MED ORDER — LABETALOL HCL 5 MG/ML IV SOLN: 10.0000 mg | INTRAVENOUS | Status: AC | PRN

## 2021-07-15 MED ORDER — SODIUM CHLORIDE 0.9 % IV SOLN: 250.0000 mL | INTRAVENOUS | Status: DC | PRN

## 2021-07-15 MED ORDER — FENTANYL CITRATE (PF) 100 MCG/2ML IJ SOLN
INTRAMUSCULAR | Status: DC | PRN
  Administered 2021-07-15: 25 ug via INTRAVENOUS

## 2021-07-15 MED ORDER — MIDAZOLAM HCL 2 MG/2ML IJ SOLN
INTRAMUSCULAR | Status: AC
  Filled 2021-07-15: qty 2

## 2021-07-15 MED ORDER — CARVEDILOL 3.125 MG PO TABS: 3.1250 mg | ORAL_TABLET | Freq: Two times a day (BID) | ORAL | Status: DC

## 2021-07-15 MED ORDER — HEPARIN SODIUM (PORCINE) 1000 UNIT/ML IJ SOLN
INTRAMUSCULAR | Status: DC | PRN
  Administered 2021-07-15: 7000 [IU] via INTRAVENOUS
  Administered 2021-07-15: 3000 [IU] via INTRAVENOUS

## 2021-07-15 MED ORDER — SODIUM CHLORIDE 0.9% FLUSH: 3.0000 mL | INTRAVENOUS | Status: DC | PRN

## 2021-07-15 MED ORDER — LIDOCAINE HCL (PF) 1 % IJ SOLN
INTRAMUSCULAR | Status: AC
  Filled 2021-07-15: qty 30

## 2021-07-15 MED ORDER — FENTANYL CITRATE (PF) 100 MCG/2ML IJ SOLN
INTRAMUSCULAR | Status: AC
  Filled 2021-07-15: qty 2

## 2021-07-15 MED ORDER — SACUBITRIL-VALSARTAN 24-26 MG PO TABS
1.0000 | ORAL_TABLET | Freq: Two times a day (BID) | ORAL | Status: DC
  Administered 2021-07-16: 1 via ORAL
  Filled 2021-07-15: qty 1

## 2021-07-15 MED ORDER — IOHEXOL 350 MG/ML SOLN
INTRAVENOUS | Status: DC | PRN
  Administered 2021-07-15: 110 mL via INTRA_ARTERIAL

## 2021-07-15 MED ORDER — NITROGLYCERIN 1 MG/10 ML FOR IR/CATH LAB
INTRA_ARTERIAL | Status: AC
  Filled 2021-07-15: qty 10

## 2021-07-15 MED ORDER — CARVEDILOL 3.125 MG PO TABS
3.1250 mg | ORAL_TABLET | Freq: Two times a day (BID) | ORAL | Status: DC
  Administered 2021-07-15 – 2021-07-16 (×2): 3.125 mg via ORAL
  Filled 2021-07-15 (×2): qty 1

## 2021-07-15 MED ORDER — MIDAZOLAM HCL 2 MG/2ML IJ SOLN
INTRAMUSCULAR | Status: DC | PRN
  Administered 2021-07-15: 1 mg via INTRAVENOUS

## 2021-07-15 MED ORDER — HYDRALAZINE HCL 20 MG/ML IJ SOLN: 10.0000 mg | INTRAMUSCULAR | Status: AC | PRN

## 2021-07-15 MED ORDER — MORPHINE SULFATE (PF) 2 MG/ML IV SOLN
2.0000 mg | INTRAVENOUS | Status: DC | PRN
Start: 2021-07-15 — End: 2021-07-16

## 2021-07-15 MED ORDER — ONDANSETRON HCL 4 MG/2ML IJ SOLN: 4.0000 mg | Freq: Four times a day (QID) | INTRAMUSCULAR | Status: DC | PRN

## 2021-07-15 MED ORDER — SODIUM CHLORIDE 0.9 % IV SOLN: INTRAVENOUS | Status: AC

## 2021-07-15 MED ORDER — ACETAMINOPHEN 325 MG PO TABS: 650.0000 mg | ORAL_TABLET | ORAL | Status: DC | PRN

## 2021-07-15 MED ORDER — ASPIRIN 81 MG PO CHEW
81.0000 mg | CHEWABLE_TABLET | Freq: Every day | ORAL | Status: DC
  Administered 2021-07-16: 81 mg via ORAL
  Filled 2021-07-15: qty 1

## 2021-07-15 MED ORDER — LIDOCAINE HCL (PF) 1 % IJ SOLN
INTRAMUSCULAR | Status: DC | PRN
  Administered 2021-07-15: 15 mL
  Administered 2021-07-15: 2 mL

## 2021-07-15 MED ORDER — HEPARIN (PORCINE) IN NACL 1000-0.9 UT/500ML-% IV SOLN
INTRAVENOUS | Status: DC | PRN
  Administered 2021-07-15 (×2): 500 mL

## 2021-07-15 MED ORDER — HEPARIN (PORCINE) IN NACL 1000-0.9 UT/500ML-% IV SOLN
INTRAVENOUS | Status: AC
  Filled 2021-07-15: qty 1000

## 2021-07-15 SURGICAL SUPPLY — 25 items
BALLN SAPPHIRE 2.0X12 (BALLOONS) ×2
BALLN SAPPHIRE ~~LOC~~ 2.5X8 (BALLOONS) ×2 IMPLANT
BALLN SAPPHIRE ~~LOC~~ 2.75X12 (BALLOONS) ×2 IMPLANT
BALLN SCOREFLEX 2.50X10 (BALLOONS) ×2
BALLOON SAPPHIRE 2.0X12 (BALLOONS) ×1 IMPLANT
BALLOON SCOREFLEX 2.50X10 (BALLOONS) ×1 IMPLANT
CATH INFINITI 5FR MULTPACK ANG (CATHETERS) ×2 IMPLANT
CATH LAUNCHER 6FR AL.75 (CATHETERS) ×2 IMPLANT
GLIDESHEATH SLEND A-KIT 6F 22G (SHEATH) ×2 IMPLANT
GUIDELINER 6F (CATHETERS) ×2 IMPLANT
GUIDEWIRE INQWIRE 1.5J.035X260 (WIRE) ×1 IMPLANT
INQWIRE 1.5J .035X260CM (WIRE) ×2
KIT ENCORE 26 ADVANTAGE (KITS) ×2 IMPLANT
KIT HEART LEFT (KITS) ×2 IMPLANT
PACK CARDIAC CATHETERIZATION (CUSTOM PROCEDURE TRAY) ×2 IMPLANT
SHEATH PINNACLE 5F 10CM (SHEATH) ×2 IMPLANT
SHEATH PINNACLE 6F 10CM (SHEATH) ×2 IMPLANT
SHEATH PROBE COVER 6X72 (BAG) ×2 IMPLANT
STENT ONYX FRONTIER 2.5X08 (Permanent Stent) ×2 IMPLANT
STENT ONYX FRONTIER 2.5X15 (Permanent Stent) ×2 IMPLANT
TRANSDUCER W/STOPCOCK (MISCELLANEOUS) ×2 IMPLANT
TUBING CIL FLEX 10 FLL-RA (TUBING) ×2 IMPLANT
WIRE ASAHI PROWATER 180CM (WIRE) ×2 IMPLANT
WIRE EMERALD 3MM-J .035X150CM (WIRE) ×2 IMPLANT
WIRE HI TORQ VERSACORE-J 145CM (WIRE) ×2 IMPLANT

## 2021-07-15 NOTE — Progress Notes (Signed)
PROGRESS NOTE        PATIENT DETAILS Name: [name] [name]. Age: 75 y.o. Sex: male Date of Birth: [birth year] Admit Date: 07/13/2021 Admitting Physician Shela Leff, MD [id number], Shireen Quan, FNP  Brief Narrative: Patient is a 75 y.o. male with history of CAD, HFrEF, HTN, HLD-who presented with atypical chest pain.  Found to have non-STEMI.  See below for further details.  Subjective: No chest pain-lying comfortably in bed.  Inquiring regarding the time of his LHC  Objective: Vitals: Blood pressure 135/81, pulse 61, temperature 97.7 F (36.5 C), temperature source Oral, resp. rate 15, height 5\' 6"  (1.676 m), weight 60.5 kg, SpO2 94 %.   Exam: Gen Exam:Alert awake-not in any distress HEENT:atraumatic, normocephalic Chest: B/L clear to auscultation anteriorly CVS:S1S2 regular Abdomen:soft non tender, non distended Extremities:no edema Neurology: Non focal Skin: no rash   Pertinent Labs/Radiology: Creatinine: 0.89 Hb: 15.1  10/29>>CXR: No PNA 10/30>> Echo: EF 25%, and numerous regional wall motion abnormalities.   Assessment/Plan: Non-STEMI: No chest pain overnight-on aspirin/beta-blocker/statin/IV heparin-LHC scheduled for later today.  Echo with EF 25% and numerous regional wall motion abnormalities.  HFrEF: Significant drop in ejection fraction on echo-likely ischemic cardiomyopathy-euvolemic on exam-for LHC today.  HLD: Continue statin  Procedures: None Consults:Cardiology DVT Prophylaxis: IV Heparin Code Status:Full code  Family Communication: None at bedside  Time spent: 35 minutes-Greater than 50% of this time was spent in counseling, explanation of diagnosis, planning of further management, and coordination of care.  Diet: Diet Order             Diet NPO time specified Except for: Sips with Meds  Diet effective midnight                      Disposition Plan: Status is: Inpatient  Remains inpatient  appropriate because: Non-STEMI-on IV heparin infusion-for LHC on Monday.   Antimicrobial agents: Anti-infectives (From admission, onward)    None        MEDICATIONS: Scheduled Meds:  [MAR Hold] aspirin  81 mg Oral Daily   [MAR Hold] metoprolol tartrate  12.5 mg Oral BID   [MAR Hold] rosuvastatin  40 mg Oral Daily   [MAR Hold] sodium chloride flush  3 mL Intravenous Q12H   Continuous Infusions:  sodium chloride     sodium chloride 10 mL/hr at 07/15/21 0534   heparin 1,250 Units/hr (07/15/21 0145)   PRN Meds:.sodium chloride, [MAR Hold] acetaminophen, fentaNYL, Heparin (Porcine) in NaCl, heparin sodium (porcine), lidocaine (PF), midazolam, [MAR Hold] ondansetron (ZOFRAN) IV, sodium chloride flush, ticagrelor   I have personally reviewed following labs and imaging studies  LABORATORY DATA: CBC: Recent Labs  Lab 07/13/21 0303 07/14/21 0206 07/15/21 0507  WBC 10.7* 7.6 7.0  NEUTROABS 7.4  --   --   HGB 15.4 14.2 15.1  HCT 45.3 43.4 43.8  MCV 95.4 97.1 94.6  PLT 176 168 173     Basic Metabolic Panel: Recent Labs  Lab 07/09/21 1030 07/13/21 0303 07/14/21 0206 07/15/21 0507  NA 140 139 137 136  K 5.2 4.6 4.4 3.8  CL 104 104 108 107  CO2 24 28 25 23   GLUCOSE 87 119* 107* 97  BUN 14 21 20 17   CREATININE 0.87 0.97 0.85 0.89  CALCIUM 9.5 9.3 8.8* 8.9     GFR: Estimated Creatinine Clearance: 61.4 mL/min (by  C-G formula based on SCr of 0.89 mg/dL).  Liver Function Tests: Recent Labs  Lab 07/09/21 1030 07/13/21 0303  AST 24 23  ALT 23 23  ALKPHOS 72 57  BILITOT 0.4 0.8  PROT 6.4 6.2*  ALBUMIN 4.1 3.5    No results for input(s): LIPASE, AMYLASE in the last 168 hours. No results for input(s): AMMONIA in the last 168 hours.  Coagulation Profile: Recent Labs  Lab 07/13/21 0303  INR 1.1     Cardiac Enzymes: No results for input(s): CKTOTAL, CKMB, CKMBINDEX, TROPONINI in the last 168 hours.  BNP (last 3 results) No results for input(s): PROBNP  in the last 8760 hours.  Lipid Profile: Recent Labs    07/13/21 0303  CHOL 132  HDL 39*  LDLCALC 76  TRIG 83  CHOLHDL 3.4     Thyroid Function Tests: No results for input(s): TSH, T4TOTAL, FREET4, T3FREE, THYROIDAB in the last 72 hours.  Anemia Panel: No results for input(s): VITAMINB12, FOLATE, FERRITIN, TIBC, IRON, RETICCTPCT in the last 72 hours.  Urine analysis: No results found for: COLORURINE, APPEARANCEUR, LABSPEC, PHURINE, GLUCOSEU, HGBUR, BILIRUBINUR, KETONESUR, PROTEINUR, UROBILINOGEN, NITRITE, LEUKOCYTESUR  Sepsis Labs: Lactic Acid, Venous No results found for: LATICACIDVEN  MICROBIOLOGY: Recent Results (from the past 240 hour(s))  Resp Panel by RT-PCR (Flu A&B, Covid) Nasopharyngeal Swab     Status: None   Collection Time: 07/13/21  3:03 AM   Specimen: Nasopharyngeal Swab; Nasopharyngeal(NP) swabs in vial transport medium  Result Value Ref Range Status   SARS Coronavirus 2 by RT PCR NEGATIVE NEGATIVE Final    Comment: (NOTE) SARS-CoV-2 target nucleic acids are NOT DETECTED.  The SARS-CoV-2 RNA is generally detectable in upper respiratory specimens during the acute phase of infection. The lowest concentration of SARS-CoV-2 viral copies this assay can detect is 138 copies/mL. A negative result does not preclude SARS-Cov-2 infection and should not be used as the sole basis for treatment or other patient management decisions. A negative result may occur with  improper specimen collection/handling, submission of specimen other than nasopharyngeal swab, presence of viral mutation(s) within the areas targeted by this assay, and inadequate number of viral copies(<138 copies/mL). A negative result must be combined with clinical observations, patient history, and epidemiological information. The expected result is Negative.  Fact Sheet for Patients:  EntrepreneurPulse.com.au  Fact Sheet for Healthcare Providers:   IncredibleEmployment.be  This test is no t yet approved or cleared by the [place] FDA and  has been authorized for detection and/or diagnosis of SARS-CoV-2 by FDA under an Emergency Use Authorization (EUA). This EUA will remain  in effect (meaning this test can be used) for the duration of the COVID-19 declaration under Section 564(b)(1) of the Act, 21 U.S.C.section 360bbb-3(b)(1), unless the authorization is terminated  or revoked sooner.       Influenza A by PCR NEGATIVE NEGATIVE Final   Influenza B by PCR NEGATIVE NEGATIVE Final    Comment: (NOTE) The Xpert Xpress SARS-CoV-2/FLU/RSV plus assay is intended as an aid in the diagnosis of influenza from Nasopharyngeal swab specimens and should not be used as a sole basis for treatment. Nasal washings and aspirates are unacceptable for Xpert Xpress SARS-CoV-2/FLU/RSV testing.  Fact Sheet for Patients: EntrepreneurPulse.com.au  Fact Sheet for Healthcare Providers: IncredibleEmployment.be  This test is not yet approved or cleared by the [place] FDA and has been authorized for detection and/or diagnosis of SARS-CoV-2 by FDA under an Emergency Use Authorization (EUA). This EUA will remain in effect (meaning  this test can be used) for the duration of the COVID-19 declaration under Section 564(b)(1) of the Act, 21 U.S.C. section 360bbb-3(b)(1), unless the authorization is terminated or revoked.  Performed at [facility], [place] [address] [place], [place] [address]   Surgical pcr screen     Status: None   Collection Time: 07/15/21  4:09 AM   Specimen: Nasal Mucosa; Nasal Swab  Result Value Ref Range Status   MRSA, PCR NEGATIVE NEGATIVE Final   Staphylococcus aureus NEGATIVE NEGATIVE Final    Comment: (NOTE) The Xpert SA Assay (FDA approved for NASAL specimens in patients 49 years of age and older), is one component of a comprehensive surveillance program.  It is not intended to diagnose infection nor to guide or monitor treatment. Performed at [facility], [place] [address] [place], [place] [address]     RADIOLOGY STUDIES/RESULTS: ECHOCARDIOGRAM COMPLETE  Result Date: 07/14/2021    ECHOCARDIOGRAM REPORT   Patient Name:   Mickeal Dirienzo. Date of Exam: 07/14/2021 Medical Rec #:  [id number]           Height:       66.0 in Accession #:    [id number]          Weight:       140.2 lb Date of Birth:  1946/07/02           BSA:          1.720 m Patient Age:    62 years            BP:           128/70 mmHg Patient Gender: M                   HR:           60 bpm. Exam Location:  Inpatient Procedure: 2D Echo, Color Doppler and Cardiac Doppler Indications:    NSTEMI  History:        Patient has prior history of Echocardiogram examinations, most                 recent 11/15/2018. CAD and Previous Myocardial Infarction,                 Signs/Symptoms:Chest Pain; Risk Factors:Dyslipidemia.  Sonographer:    Melissa Morford RDCS (AE, PE) Referring Phys: [id number] VASUNDHRA [place]  1. Left ventricular ejection fraction, by estimation, is 25%. The left ventricle has severely decreased function. The left ventricle demonstrates regional wall motion abnormalities (while all walls are hypokinetic, inferior territories are worse; this is concerning for multi-vessel disease). Left ventricular diastolic parameters are consistent with Grade II diastolic dysfunction (pseudonormalization).  2. Right ventricular systolic function is normal. The right ventricular size is normal.  3. The mitral valve is normal in structure. Mild to moderate mitral valve regurgitation. No evidence of mitral stenosis. Regurgitation appears to be related to posterior restriction.  4. The aortic valve is tricuspid. Aortic valve regurgitation is not visualized. Mild aortic valve sclerosis is present, with no evidence of aortic valve stenosis.  5. The inferior vena cava is normal in size with  greater than 50% respiratory variability, suggesting right atrial pressure of 3 mmHg. Comparison(s): A prior study was performed on 11/15/2018. Prior images reviewed side by side. LV function as worsened. FINDINGS  Left Ventricle: Left ventricular ejection fraction, by estimation, is 25%. The left ventricle has severely decreased function. The left ventricle demonstrates regional wall motion abnormalities. The  left ventricular internal cavity size was normal in size. There is no left ventricular hypertrophy. Left ventricular diastolic parameters are consistent with Grade II diastolic dysfunction (pseudonormalization). Right Ventricle: The right ventricular size is normal. No increase in right ventricular wall thickness. Right ventricular systolic function is normal. Left Atrium: Left atrial size was normal in size. Right Atrium: Right atrial size was normal in size. Pericardium: There is no evidence of pericardial effusion. Mitral Valve: The mitral valve is normal in structure. Mild to moderate mitral valve regurgitation. No evidence of mitral valve stenosis. Tricuspid Valve: The tricuspid valve is normal in structure. Tricuspid valve regurgitation is not demonstrated. Aortic Valve: The aortic valve is tricuspid. Aortic valve regurgitation is not visualized. Mild aortic valve sclerosis is present, with no evidence of aortic valve stenosis. Pulmonic Valve: The pulmonic valve was not well visualized. Pulmonic valve regurgitation is not visualized. No evidence of pulmonic stenosis. Aorta: The aortic root is normal in size and structure. Venous: The inferior vena cava is normal in size with greater than 50% respiratory variability, suggesting right atrial pressure of 3 mmHg. IAS/Shunts: The atrial septum is grossly normal.  LEFT VENTRICLE PLAX 2D LVIDd:         5.30 cm   Diastology LVIDs:         4.70 cm   LV e' medial:    4.90 cm/s LV PW:         0.70 cm   LV E/e' medial:  14.3 LV IVS:        0.80 cm   LV e' lateral:    9.46 cm/s LVOT diam:     2.10 cm   LV E/e' lateral: 7.4 LV SV:         50 LV SV Index:   29 LVOT Area:     3.46 cm  RIGHT VENTRICLE RV S prime:     11.60 cm/s TAPSE (M-mode): 1.8 cm LEFT ATRIUM             Index        RIGHT ATRIUM           Index LA diam:        4.20 cm 2.44 cm/m   RA Area:     17.50 cm LA Vol (A2C):   55.4 ml 32.22 ml/m  RA Volume:   47.60 ml  27.68 ml/m LA Vol (A4C):   58.2 ml 33.85 ml/m LA Biplane Vol: 57.3 ml 33.32 ml/m  AORTIC VALVE LVOT Vmax:   72.60 cm/s LVOT Vmean:  50.500 cm/s LVOT VTI:    0.145 m  AORTA Ao Root diam: 3.00 cm MITRAL VALVE MV Area (PHT): 3.93 cm    SHUNTS MV Decel Time: 193 msec    Systemic VTI:  0.14 m MV E velocity: 70.30 cm/s  Systemic Diam: 2.10 cm MV A velocity: 53.80 cm/s MV E/A ratio:  1.31 Riley Lam MD Electronically signed by Riley Lam MD Signature Date/Time: 07/14/2021/11:24:47 AM    Final    ABORTED INVASIVE LAB PROCEDURE  Result Date: 07/15/2021 This case was aborted.    LOS: 2 days   Jeoffrey Massed, MD  [facility]    To contact the attending provider between 7A-7P or the covering provider during after hours 7P-7A, please log into the web site [email or web address] and access using universal [place] password for that web site. If you do not have the password, please call the hospital operator.  07/15/2021, 11:15 AM

## 2021-07-15 NOTE — Interval H&P Note (Signed)
Cath Lab Visit (complete for each Cath Lab visit)  Clinical Evaluation Leading to the Procedure:   ACS: Yes.    Non-ACS:    Anginal Classification: CCS II  Anti-ischemic medical therapy: No Therapy  Non-Invasive Test Results: No non-invasive testing performed  Prior CABG: No previous CABG      History and Physical Interval Note:  07/15/2021 10:23 AM  [name] [name].  has presented today for surgery, with the diagnosis of NSTEMI.  The various methods of treatment have been discussed with the patient and family. After consideration of risks, benefits and other options for treatment, the patient has consented to  Procedure(s): LEFT HEART CATH AND CORONARY ANGIOGRAPHY (N/A) as a surgical intervention.  The patient's history has been reviewed, patient examined, no change in status, stable for surgery.  I have reviewed the patient's chart and labs.  Questions were answered to the patient's satisfaction.     Nanetta Batty

## 2021-07-15 NOTE — Progress Notes (Addendum)
Called cath lab about what time patient may go today. And asked about aborted invasive lab procedure.  Told he is an add-on and may go before lunch.

## 2021-07-15 NOTE — Plan of Care (Signed)

## 2021-07-15 NOTE — Progress Notes (Signed)
32f sheath removed from right femoral artery at 1535.  Manual pressure applied to site for 20 min by Nehemiah Settle, RN from 6500, with my supervision.  VSS, palpable RDP pulse, groin level 0 pre and post sheath removal.  Tegaderm dressing applied to site.  Post instructions given.  Patient acknowledges and understands.

## 2021-07-15 NOTE — H&P (View-Only) (Signed)
Progress Note  Patient Name: [name] [name]. Date of Encounter: 07/15/2021  [facility] Cardiologist: Mertie Moores, MD   Subjective   No chest discomfort since relieved by sublingual nitroglycerin on the morning of admission.  Inpatient Medications    Scheduled Meds:  aspirin  81 mg Oral Daily   metoprolol tartrate  12.5 mg Oral BID   rosuvastatin  40 mg Oral Daily   sodium chloride flush  3 mL Intravenous Q12H   Continuous Infusions:  sodium chloride     sodium chloride 10 mL/hr at 07/15/21 0534   heparin 1,250 Units/hr (07/15/21 0145)   PRN Meds: sodium chloride, acetaminophen, ondansetron (ZOFRAN) IV, sodium chloride flush   Vital Signs    Vitals:   07/15/21 0506 07/15/21 0600 07/15/21 0607 07/15/21 0745  BP:    135/81  Pulse: 62 62  61  Resp: 19 12 14 15   Temp:    97.7 F (36.5 C)  TempSrc:    Oral  SpO2: 94% 98% 97% 95%  Weight:      Height:        Intake/Output Summary (Last 24 hours) at 07/15/2021 0805 Last data filed at 07/15/2021 0535 Gross per 24 hour  Intake 1362.88 ml  Output 1675 ml  Net -312.12 ml   Last 3 Weights 07/15/2021 07/13/2021 07/13/2021  Weight (lbs) 133 lb 6.4 oz 140 lb 3.2 oz 142 lb  Weight (kg) 60.51 kg 63.594 kg 64.411 kg      Telemetry    Sinus with rare PVC - Personally Reviewed   Physical Exam   GEN: No acute distress.   Neck: No JVD Cardiac: RRR Respiratory: Clear to auscultation bilaterally. GI: Soft, nontender, non-distended  MS: No edema Neuro:  Nonfocal  Psych: Normal affect   Labs    High Sensitivity Troponin:   Recent Labs  Lab 07/13/21 0303 07/13/21 0450 07/13/21 0733  TROPONINIHS 40* 178* 647*     Chemistry Recent Labs  Lab 07/09/21 1030 07/13/21 0303 07/14/21 0206 07/15/21 0507  NA 140 139 137 136  K 5.2 4.6 4.4 3.8  CL 104 104 108 107  CO2 24 28 25 23   GLUCOSE 87 119* 107* 97  BUN 14 21 20 17   CREATININE 0.87 0.97 0.85 0.89  CALCIUM 9.5 9.3 8.8* 8.9  PROT 6.4 6.2*  --    --   ALBUMIN 4.1 3.5  --   --   AST 24 23  --   --   ALT 23 23  --   --   ALKPHOS 72 57  --   --   BILITOT 0.4 0.8  --   --   GFRNONAA  --  >60 >60 >60  ANIONGAP  --  7 4* 6    Lipids  Recent Labs  Lab 07/13/21 0303  CHOL 132  TRIG 83  HDL 39*  LDLCALC 76  CHOLHDL 3.4    Hematology Recent Labs  Lab 07/13/21 0303 07/14/21 0206 07/15/21 0507  WBC 10.7* 7.6 7.0  RBC 4.75 4.47 4.63  HGB 15.4 14.2 15.1  HCT 45.3 43.4 43.8  MCV 95.4 97.1 94.6  MCH 32.4 31.8 32.6  MCHC 34.0 32.7 34.5  RDW 12.6 12.9 12.6  PLT 176 168 173    Radiology    ECHOCARDIOGRAM COMPLETE  Result Date: 07/14/2021    ECHOCARDIOGRAM REPORT   Patient Name:   Dywane Colston. Date of Exam: 07/14/2021 Medical Rec #:  [id number]  Height:       66.0 in Accession #:    [id number]          Weight:       140.2 lb Date of Birth:  Mar 31, 1946           BSA:          1.720 m Patient Age:    75 years            BP:           128/70 mmHg Patient Gender: M                   HR:           60 bpm. Exam Location:  Inpatient Procedure: 2D Echo, Color Doppler and Cardiac Doppler Indications:    NSTEMI  History:        Patient has prior history of Echocardiogram examinations, most                 recent 11/15/2018. CAD and Previous Myocardial Infarction,                 Signs/Symptoms:Chest Pain; Risk Factors:Dyslipidemia.  Sonographer:    Melissa Morford RDCS (AE, PE) Referring Phys: [id number] VASUNDHRA RATHORE IMPRESSIONS  1. Left ventricular ejection fraction, by estimation, is 25%. The left ventricle has severely decreased function. The left ventricle demonstrates regional wall motion abnormalities (while all walls are hypokinetic, inferior territories are worse; this is concerning for multi-vessel disease). Left ventricular diastolic parameters are consistent with Grade II diastolic dysfunction (pseudonormalization).  2. Right ventricular systolic function is normal. The right ventricular size is normal.  3. The mitral  valve is normal in structure. Mild to moderate mitral valve regurgitation. No evidence of mitral stenosis. Regurgitation appears to be related to posterior restriction.  4. The aortic valve is tricuspid. Aortic valve regurgitation is not visualized. Mild aortic valve sclerosis is present, with no evidence of aortic valve stenosis.  5. The inferior vena cava is normal in size with greater than 50% respiratory variability, suggesting right atrial pressure of 3 mmHg. Comparison(s): A prior study was performed on 11/15/2018. Prior images reviewed side by side. LV function as worsened. FINDINGS  Left Ventricle: Left ventricular ejection fraction, by estimation, is 25%. The left ventricle has severely decreased function. The left ventricle demonstrates regional wall motion abnormalities. The left ventricular internal cavity size was normal in size. There is no left ventricular hypertrophy. Left ventricular diastolic parameters are consistent with Grade II diastolic dysfunction (pseudonormalization). Right Ventricle: The right ventricular size is normal. No increase in right ventricular wall thickness. Right ventricular systolic function is normal. Left Atrium: Left atrial size was normal in size. Right Atrium: Right atrial size was normal in size. Pericardium: There is no evidence of pericardial effusion. Mitral Valve: The mitral valve is normal in structure. Mild to moderate mitral valve regurgitation. No evidence of mitral valve stenosis. Tricuspid Valve: The tricuspid valve is normal in structure. Tricuspid valve regurgitation is not demonstrated. Aortic Valve: The aortic valve is tricuspid. Aortic valve regurgitation is not visualized. Mild aortic valve sclerosis is present, with no evidence of aortic valve stenosis. Pulmonic Valve: The pulmonic valve was not well visualized. Pulmonic valve regurgitation is not visualized. No evidence of pulmonic stenosis. Aorta: The aortic root is normal in size and structure. Venous:  The inferior vena cava is normal in size with greater than 50% respiratory variability, suggesting right atrial pressure of 3 mmHg. IAS/Shunts: The atrial  septum is grossly normal.  LEFT VENTRICLE PLAX 2D LVIDd:         5.30 cm   Diastology LVIDs:         4.70 cm   LV e' medial:    4.90 cm/s LV PW:         0.70 cm   LV E/e' medial:  14.3 LV IVS:        0.80 cm   LV e' lateral:   9.46 cm/s LVOT diam:     2.10 cm   LV E/e' lateral: 7.4 LV SV:         50 LV SV Index:   29 LVOT Area:     3.46 cm  RIGHT VENTRICLE RV S prime:     11.60 cm/s TAPSE (M-mode): 1.8 cm LEFT ATRIUM             Index        RIGHT ATRIUM           Index LA diam:        4.20 cm 2.44 cm/m   RA Area:     17.50 cm LA Vol (A2C):   55.4 ml 32.22 ml/m  RA Volume:   47.60 ml  27.68 ml/m LA Vol (A4C):   58.2 ml 33.85 ml/m LA Biplane Vol: 57.3 ml 33.32 ml/m  AORTIC VALVE LVOT Vmax:   72.60 cm/s LVOT Vmean:  50.500 cm/s LVOT VTI:    0.145 m  AORTA Ao Root diam: 3.00 cm MITRAL VALVE MV Area (PHT): 3.93 cm    SHUNTS MV Decel Time: 193 msec    Systemic VTI:  0.14 m MV E velocity: 70.30 cm/s  Systemic Diam: 2.10 cm MV A velocity: 53.80 cm/s MV E/A ratio:  1.31 Rudean Haskell MD Electronically signed by Rudean Haskell MD Signature Date/Time: 07/14/2021/11:24:47 AM    Final    ABORTED INVASIVE LAB PROCEDURE  Result Date: 07/15/2021 This case was aborted.     Patient Profile     75 year old male with past medical history of coronary artery disease, hyperlipidemia admitted with non-ST elevation myocardial infarction.  Also with history of ischemic cardiomyopathy with ejection fraction on most recent echocardiogram in March 2020 45 to 50%.    Assessment & Plan    Non-ST elevation MI: Patient understands the procedure and is willing to proceed.  He has not had recurrent angina.  Continue aspirin, metoprolol, and rosuvastatin therapy.   Chronic systolic heart failure: Echocardiography demonstrates significant decline in LV systolic  function from near normal to EF 25%.  Guideline directed medical therapy will need to be instituted.  He is currently on beta-blocker therapy but will need Arni and SGLT2 plus minus MRA.  Will await cath results before proceeding with further up titration Hyperlipidemia-continue statin.    The patient was counseled to undergo left heart catheterization, coronary angiography, and possible percutaneous coronary intervention with stent implantation. The procedural risks and benefits were discussed in detail. The risks discussed included death, stroke, myocardial infarction, life-threatening bleeding, limb ischemia, kidney injury, allergy, and possible emergency cardiac surgery. The risk of these significant complications were estimated to occur less than 1% of the time. After discussion, the patient has agreed to proceed.   For questions or updates, please contact [place] Please consult [email or web address] for contact info under        Signed, Sinclair Grooms, MD  07/15/2021, 8:05 AM

## 2021-07-15 NOTE — Progress Notes (Signed)
ANTICOAGULATION CONSULT NOTE  Pharmacy Consult for heparin Indication: chest pain/ACS  Allergies  Allergen Reactions   Lipitor [Atorvastatin] Other (See Comments)    "Makes pt feel funky"    Patient Measurements: Height: 5\' 6"  (167.6 cm) Weight: 60.5 kg (133 lb 6.4 oz) IBW/kg (Calculated) : 63.8 Heparin dosing wt: 64 kg  Vital Signs: Temp: 97.7 F (36.5 C) (10/31 0745) Temp Source: Oral (10/31 0745) BP: 135/81 (10/31 0745) Pulse Rate: 61 (10/31 0745)  Labs: Recent Labs    07/13/21 0303 07/13/21 0450 07/13/21 0733 07/13/21 1555 07/14/21 0206 07/14/21 1034 07/14/21 2203 07/15/21 0507  HGB 15.4  --   --   --  14.2  --   --  15.1  HCT 45.3  --   --   --  43.4  --   --  43.8  PLT 176  --   --   --  168  --   --  173  APTT 32  --   --   --   --   --   --   --   LABPROT 13.9  --   --   --   --   --   --   --   INR 1.1  --   --   --   --   --   --   --   HEPARINUNFRC  --   --   --    < > 0.39 0.13* 0.24* 0.30  CREATININE 0.97  --   --   --  0.85  --   --  0.89  TROPONINIHS 40* 178* 647*  --   --   --   --   --    < > = values in this interval not displayed.     Estimated Creatinine Clearance: 61.4 mL/min (by C-G formula based on SCr of 0.89 mg/dL).   Assessment: 75yo male c/o waking up to CP radiating to LUE that felt similar to prior MI, code STEMI initially called in the field and canceled on arrival, to begin heparin.  Heparin level therapeutic at 0.3, on lower end but should trend up, level drawn ~2h early. CBC remains stable. Cath planned for later today.  Goal of Therapy:  Heparin level 0.3-0.7 units/ml Monitor platelets by anticoagulation protocol: Yes   Plan:  Continue heparin 1250 units/h Daily heparin level and CBC  75yo, PharmD, [place], [facility] Clinical Pharmacist [phone] Please check AMION for all [facility] Pharmacy numbers 07/15/2021

## 2021-07-15 NOTE — Progress Notes (Signed)
 Progress Note  Patient Name: [name] J Streed Jr. Date of Encounter: 07/15/2021  [facility] Cardiologist: Philip Nahser, MD   Subjective   No chest discomfort since relieved by sublingual nitroglycerin on the morning of admission.  Inpatient Medications    Scheduled Meds:  aspirin  81 mg Oral Daily   metoprolol tartrate  12.5 mg Oral BID   rosuvastatin  40 mg Oral Daily   sodium chloride flush  3 mL Intravenous Q12H   Continuous Infusions:  sodium chloride     sodium chloride 10 mL/hr at 07/15/21 0534   heparin 1,250 Units/hr (07/15/21 0145)   PRN Meds: sodium chloride, acetaminophen, ondansetron (ZOFRAN) IV, sodium chloride flush   Vital Signs    Vitals:   07/15/21 0506 07/15/21 0600 07/15/21 0607 07/15/21 0745  BP:    135/81  Pulse: 62 62  61  Resp: 19 12 14 15  Temp:    97.7 F (36.5 C)  TempSrc:    Oral  SpO2: 94% 98% 97% 95%  Weight:      Height:        Intake/Output Summary (Last 24 hours) at 07/15/2021 0805 Last data filed at 07/15/2021 0535 Gross per 24 hour  Intake 1362.88 ml  Output 1675 ml  Net -312.12 ml   Last 3 Weights 07/15/2021 07/13/2021 07/13/2021  Weight (lbs) 133 lb 6.4 oz 140 lb 3.2 oz 142 lb  Weight (kg) 60.51 kg 63.594 kg 64.411 kg      Telemetry    Sinus with rare PVC - Personally Reviewed   Physical Exam   GEN: No acute distress.   Neck: No JVD Cardiac: RRR Respiratory: Clear to auscultation bilaterally. GI: Soft, nontender, non-distended  MS: No edema Neuro:  Nonfocal  Psych: Normal affect   Labs    High Sensitivity Troponin:   Recent Labs  Lab 07/13/21 0303 07/13/21 0450 07/13/21 0733  TROPONINIHS 40* 178* 647*     Chemistry Recent Labs  Lab 07/09/21 1030 07/13/21 0303 07/14/21 0206 07/15/21 0507  NA 140 139 137 136  K 5.2 4.6 4.4 3.8  CL 104 104 108 107  CO2 24 28 25 23  GLUCOSE 87 119* 107* 97  BUN 14 21 20 17  CREATININE 0.87 0.97 0.85 0.89  CALCIUM 9.5 9.3 8.8* 8.9  PROT 6.4 6.2*  --    --   ALBUMIN 4.1 3.5  --   --   AST 24 23  --   --   ALT 23 23  --   --   ALKPHOS 72 57  --   --   BILITOT 0.4 0.8  --   --   GFRNONAA  --  >60 >60 >60  ANIONGAP  --  7 4* 6    Lipids  Recent Labs  Lab 07/13/21 0303  CHOL 132  TRIG 83  HDL 39*  LDLCALC 76  CHOLHDL 3.4    Hematology Recent Labs  Lab 07/13/21 0303 07/14/21 0206 07/15/21 0507  WBC 10.7* 7.6 7.0  RBC 4.75 4.47 4.63  HGB 15.4 14.2 15.1  HCT 45.3 43.4 43.8  MCV 95.4 97.1 94.6  MCH 32.4 31.8 32.6  MCHC 34.0 32.7 34.5  RDW 12.6 12.9 12.6  PLT 176 168 173    Radiology    ECHOCARDIOGRAM COMPLETE  Result Date: 07/14/2021    ECHOCARDIOGRAM REPORT   Patient Name:   [name] J Shieh Jr. Date of Exam: 07/14/2021 Medical Rec #:  [id number]             Height:       66.0 in Accession #:    [id number]          Weight:       140.2 lb Date of Birth:  Mar 31, 1946           BSA:          1.720 m Patient Age:    75 years            BP:           128/70 mmHg Patient Gender: M                   HR:           60 bpm. Exam Location:  Inpatient Procedure: 2D Echo, Color Doppler and Cardiac Doppler Indications:    NSTEMI  History:        Patient has prior history of Echocardiogram examinations, most                 recent 11/15/2018. CAD and Previous Myocardial Infarction,                 Signs/Symptoms:Chest Pain; Risk Factors:Dyslipidemia.  Sonographer:    Melissa Morford RDCS (AE, PE) Referring Phys: [id number] VASUNDHRA RATHORE IMPRESSIONS  1. Left ventricular ejection fraction, by estimation, is 25%. The left ventricle has severely decreased function. The left ventricle demonstrates regional wall motion abnormalities (while all walls are hypokinetic, inferior territories are worse; this is concerning for multi-vessel disease). Left ventricular diastolic parameters are consistent with Grade II diastolic dysfunction (pseudonormalization).  2. Right ventricular systolic function is normal. The right ventricular size is normal.  3. The mitral  valve is normal in structure. Mild to moderate mitral valve regurgitation. No evidence of mitral stenosis. Regurgitation appears to be related to posterior restriction.  4. The aortic valve is tricuspid. Aortic valve regurgitation is not visualized. Mild aortic valve sclerosis is present, with no evidence of aortic valve stenosis.  5. The inferior vena cava is normal in size with greater than 50% respiratory variability, suggesting right atrial pressure of 3 mmHg. Comparison(s): A prior study was performed on 11/15/2018. Prior images reviewed side by side. LV function as worsened. FINDINGS  Left Ventricle: Left ventricular ejection fraction, by estimation, is 25%. The left ventricle has severely decreased function. The left ventricle demonstrates regional wall motion abnormalities. The left ventricular internal cavity size was normal in size. There is no left ventricular hypertrophy. Left ventricular diastolic parameters are consistent with Grade II diastolic dysfunction (pseudonormalization). Right Ventricle: The right ventricular size is normal. No increase in right ventricular wall thickness. Right ventricular systolic function is normal. Left Atrium: Left atrial size was normal in size. Right Atrium: Right atrial size was normal in size. Pericardium: There is no evidence of pericardial effusion. Mitral Valve: The mitral valve is normal in structure. Mild to moderate mitral valve regurgitation. No evidence of mitral valve stenosis. Tricuspid Valve: The tricuspid valve is normal in structure. Tricuspid valve regurgitation is not demonstrated. Aortic Valve: The aortic valve is tricuspid. Aortic valve regurgitation is not visualized. Mild aortic valve sclerosis is present, with no evidence of aortic valve stenosis. Pulmonic Valve: The pulmonic valve was not well visualized. Pulmonic valve regurgitation is not visualized. No evidence of pulmonic stenosis. Aorta: The aortic root is normal in size and structure. Venous:  The inferior vena cava is normal in size with greater than 50% respiratory variability, suggesting right atrial pressure of 3 mmHg. IAS/Shunts: The atrial  septum is grossly normal.  LEFT VENTRICLE PLAX 2D LVIDd:         5.30 cm   Diastology LVIDs:         4.70 cm   LV e' medial:    4.90 cm/s LV PW:         0.70 cm   LV E/e' medial:  14.3 LV IVS:        0.80 cm   LV e' lateral:   9.46 cm/s LVOT diam:     2.10 cm   LV E/e' lateral: 7.4 LV SV:         50 LV SV Index:   29 LVOT Area:     3.46 cm  RIGHT VENTRICLE RV S prime:     11.60 cm/s TAPSE (M-mode): 1.8 cm LEFT ATRIUM             Index        RIGHT ATRIUM           Index LA diam:        4.20 cm 2.44 cm/m   RA Area:     17.50 cm LA Vol (A2C):   55.4 ml 32.22 ml/m  RA Volume:   47.60 ml  27.68 ml/m LA Vol (A4C):   58.2 ml 33.85 ml/m LA Biplane Vol: 57.3 ml 33.32 ml/m  AORTIC VALVE LVOT Vmax:   72.60 cm/s LVOT Vmean:  50.500 cm/s LVOT VTI:    0.145 m  AORTA Ao Root diam: 3.00 cm MITRAL VALVE MV Area (PHT): 3.93 cm    SHUNTS MV Decel Time: 193 msec    Systemic VTI:  0.14 m MV E velocity: 70.30 cm/s  Systemic Diam: 2.10 cm MV A velocity: 53.80 cm/s MV E/A ratio:  1.31 Mahesh Chandrasekhar MD Electronically signed by Mahesh Chandrasekhar MD Signature Date/Time: 07/14/2021/11:24:47 AM    Final    ABORTED INVASIVE LAB PROCEDURE  Result Date: 07/15/2021 This case was aborted.     Patient Profile     75-year-old male with past medical history of coronary artery disease, hyperlipidemia admitted with non-ST elevation myocardial infarction.  Also with history of ischemic cardiomyopathy with ejection fraction on most recent echocardiogram in March 2020 45 to 50%.    Assessment & Plan    Non-ST elevation MI: Patient understands the procedure and is willing to proceed.  He has not had recurrent angina.  Continue aspirin, metoprolol, and rosuvastatin therapy.   Chronic systolic heart failure: Echocardiography demonstrates significant decline in LV systolic  function from near normal to EF 25%.  Guideline directed medical therapy will need to be instituted.  He is currently on beta-blocker therapy but will need Arni and SGLT2 plus minus MRA.  Will await cath results before proceeding with further up titration Hyperlipidemia-continue statin.    The patient was counseled to undergo left heart catheterization, coronary angiography, and possible percutaneous coronary intervention with stent implantation. The procedural risks and benefits were discussed in detail. The risks discussed included death, stroke, myocardial infarction, life-threatening bleeding, limb ischemia, kidney injury, allergy, and possible emergency cardiac surgery. The risk of these significant complications were estimated to occur less than 1% of the time. After discussion, the patient has agreed to proceed.   For questions or updates, please contact [facility] Please consult [email or web address] for contact info under        Signed, Athanasia Stanwood W Christine Morton III, MD  07/15/2021, 8:05 AM    

## 2021-07-15 NOTE — Progress Notes (Addendum)
Patient had 5 beat run of PVC--talked with Cally/cardiology---this is ok---I also talked with Dr Allyson Sabal about continuous NS@75ml /hr order, I was a little uncomfortable running with patient's EF ---he has changed continous to only 4 hours, then ok to stop fluids, fluids are needed because of contrast given to patient in cath lab

## 2021-07-16 ENCOUNTER — Other Ambulatory Visit (HOSPITAL_COMMUNITY): Payer: Self-pay

## 2021-07-16 ENCOUNTER — Telehealth (HOSPITAL_COMMUNITY): Payer: Self-pay | Admitting: Pharmacist

## 2021-07-16 DIAGNOSIS — Z955 Presence of coronary angioplasty implant and graft: Secondary | ICD-10-CM

## 2021-07-16 DIAGNOSIS — I5022 Chronic systolic (congestive) heart failure: Secondary | ICD-10-CM | POA: Diagnosis not present

## 2021-07-16 DIAGNOSIS — E782 Mixed hyperlipidemia: Secondary | ICD-10-CM | POA: Diagnosis not present

## 2021-07-16 DIAGNOSIS — I214 Non-ST elevation (NSTEMI) myocardial infarction: Secondary | ICD-10-CM | POA: Diagnosis not present

## 2021-07-16 LAB — BASIC METABOLIC PANEL
Anion gap: 8 (ref 5–15)
BUN: 22 mg/dL (ref 8–23)
CO2: 21 mmol/L — ABNORMAL LOW (ref 22–32)
Calcium: 8.7 mg/dL — ABNORMAL LOW (ref 8.9–10.3)
Chloride: 108 mmol/L (ref 98–111)
Creatinine, Ser: 0.96 mg/dL (ref 0.61–1.24)
GFR, Estimated: >60 mL/min (ref >60)
Glucose, Bld: 89 mg/dL (ref 70–99)
Potassium: 3.9 mmol/L (ref 3.5–5.1)
Sodium: 137 mmol/L (ref 135–145)

## 2021-07-16 LAB — CBC
HCT: 43.6 % (ref 39.0–52.0)
Hemoglobin: 15.1 g/dL (ref 13.0–17.0)
MCH: 32.5 pg (ref 26.0–34.0)
MCHC: 34.6 g/dL (ref 30.0–36.0)
MCV: 94 fL (ref 80.0–100.0)
Platelets: 169 10*3/uL (ref 150–400)
RBC: 4.64 MIL/uL (ref 4.22–5.81)
RDW: 12.6 % (ref 11.5–15.5)
WBC: 7.7 10*3/uL (ref 4.0–10.5)
nRBC: 0 % (ref 0.0–0.2)

## 2021-07-16 MED ORDER — CARVEDILOL 3.125 MG PO TABS
3.1250 mg | ORAL_TABLET | Freq: Two times a day (BID) | ORAL | 3 refills | Status: DC
  Filled 2021-07-16: qty 60, 30d supply, fill #0

## 2021-07-16 MED ORDER — SACUBITRIL-VALSARTAN 24-26 MG PO TABS
1.0000 | ORAL_TABLET | Freq: Two times a day (BID) | ORAL | 3 refills | Status: DC
  Filled 2021-07-16: qty 60, 30d supply, fill #0

## 2021-07-16 MED ORDER — TICAGRELOR 90 MG PO TABS
90.0000 mg | ORAL_TABLET | Freq: Two times a day (BID) | ORAL | 3 refills | Status: DC
  Filled 2021-07-16: qty 60, 30d supply, fill #0

## 2021-07-16 MED ORDER — ROSUVASTATIN CALCIUM 40 MG PO TABS
40.0000 mg | ORAL_TABLET | Freq: Every day | ORAL | 3 refills | Status: DC
  Filled 2021-07-16: qty 30, 30d supply, fill #0

## 2021-07-16 NOTE — Consult Note (Signed)
   [facility] CM Inpatient Consult   07/16/2021  Compton Brigance. 11/08/1945 [id number]  [facility] [[facility]]  Accountable Care Organization [ACO] Patient: [name] [name] Medicare  Patient was screened for [facility] [[facility]]  Care Management services. Patient will have the transition of care call conducted by the primary care provider. Patient has been active with  Embedded practice in the Embedded Chronic Care Management team.  Plan: Notification sent to the [facility] Embedded Care Management and made aware of transitioning home today.  Please contact for further questions,  Charlesetta Shanks, RN BSN CCM [facility]  [phone] business mobile phone Toll free office [phone]  Fax number: [phone] [email or web address]

## 2021-07-16 NOTE — Telephone Encounter (Signed)
Hello,  The Pharmacy team is conducting a discharge transitions of care quality improvement initiative. The recommendations below are for your consideration.    [name] Savoca. is a 75 y.o. male (MRN: [id number], DOB: 03-19-1946) who was recently hospitalized on 07/13/2021 for NSTEMI. They are anticipated to visit your clinic for post-discharge follow-up and may benefit from assistance with medication initiation and/or access.       Relevant medication access issues which may benefit from further intervention include: n/a     Please consider the following therapy recommendations at follow-up appointment below:   -Add spironolactone -Add SGLT2 inhibitor   Other relevant medication issues from their recent admission include: n/a   We appreciate your assistance with the implementation of these recommendations. Please let us know if there is anything we can help you with at this time.       Thanks!   Fredonia Highland, PharmD, BCPS, [facility] Clinical Pharmacist Please check AMION for all [facility] Pharmacy numbers 07/16/2021

## 2021-07-16 NOTE — Discharge Summary (Signed)
PATIENT DETAILS Name: [name] [name]. Age: 75 y.o. Sex: male Date of Birth: 20-Mar-1946 MRN: [id number]. Admitting Physician: Shela Leff, MD CXK:GYJEH, Shireen Quan, FNP  Admit Date: 07/13/2021 Discharge date: 07/16/2021  Recommendations for Outpatient Follow-up:  Follow up with PCP in 1-2 weeks Please obtain CMP/CBC in one week Please ensure follow-up with cardiology.  Admitted From:  Home  Disposition: [place]: No  Equipment/Devices: None  Discharge Condition: Stable  CODE STATUS: FULL CODE  Diet recommendation:  Diet Order             Diet - low sodium heart healthy           Diet regular Room service appropriate? Yes; Fluid consistency: Thin  Diet effective now                    Brief Summary: Patient is a 75 y.o. male with history of CAD, HFrEF, HTN, HLD-who presented with atypical chest pain.  Found to have non-STEMI.  See below for further details.  Brief Hospital Course: Non-STEMI: No chest pain overnight-on aspirin/beta-blocker/statin/IV heparin-underwent LHC and PCI to RCA on 10/31.  Continue dual antiplatelet agents/statin/beta-blocker on discharge.  Cardiology will arrange for outpatient follow-up.   HFrEF: Significant drop in ejection fraction on echo-suspicion for ischemic cardiomyopathy but degree of cardiomyopathy seems out of proportion to CAD.  Has been started on Coreg/Entresto-seems to be tolerating well.  Cardiology will arrange for further work-up/follow-up in the outpatient setting.  Does not need diuretics as his volume status is stable.   HLD: Continue statin  Procedures 10/31>>LHC   Mid LAD-2 lesion is 40% stenosed.   Dist RCA-1 lesion is 90% stenosed.   Mid LAD-1 lesion is 40% stenosed.   Dist RCA-2 lesion is 95% stenosed.   A drug-eluting stent was successfully placed.   Post intervention, there is a 0% residual stenosis.   Post intervention, there is a 0% residual stenosis.  Discharge Diagnoses:   Principal Problem:   NSTEMI (non-ST elevated myocardial infarction) [facility]) Active Problems:   Mixed hyperlipidemia   CAD (coronary artery disease)   Chronic systolic heart failure (HCC)   Status post coronary artery stent placement   Discharge Instructions:  Activity:  As tolerated  Discharge Instructions     Amb Referral to Cardiac Rehabilitation   Complete by: As directed    Diagnosis:  Coronary Stents NSTEMI PTCA     After initial evaluation and assessments completed: Virtual Based Care may be provided alone or in conjunction with Phase 2 Cardiac Rehab based on patient barriers.: Yes   Call MD for:  difficulty breathing, headache or visual disturbances   Complete by: As directed    Call MD for:  severe uncontrolled pain   Complete by: As directed    Diet - low sodium heart healthy   Complete by: As directed    Discharge instructions   Complete by: As directed    Follow with Primary MD  [name], FNP in 1-2 weeks  Please get a complete blood count and chemistry panel checked by your Primary MD at your next visit, and again as instructed by your Primary MD.  Get Medicines reviewed and adjusted: Please take all your medications with you for your next visit with your Primary MD  Laboratory/radiological data: Please request your Primary MD to go over all hospital tests and procedure/radiological results at the follow up, please ask your Primary MD to get all Hospital records sent to  his/her office.  In some cases, they will be blood work, cultures and biopsy results pending at the time of your discharge. Please request that your primary care M.D. follows up on these results.  Also Note the following: If you experience worsening of your admission symptoms, develop shortness of breath, life threatening emergency, suicidal or homicidal thoughts you must seek medical attention immediately by calling 911 or calling your MD immediately  if symptoms less severe.  You  must read complete instructions/literature along with all the possible adverse reactions/side effects for all the Medicines you take and that have been prescribed to you. Take any new Medicines after you have completely understood and accpet all the possible adverse reactions/side effects.   Do not drive when taking Pain medications or sleeping medications (Benzodaizepines)  Do not take more than prescribed Pain, Sleep and Anxiety Medications. It is not advisable to combine anxiety,sleep and pain medications without talking with your primary care practitioner  Special Instructions: If you have smoked or chewed Tobacco  in the last 2 yrs please stop smoking, stop any regular Alcohol  and or any Recreational drug use.  Wear Seat belts while driving.  Please note: You were cared for by a hospitalist during your hospital stay. Once you are discharged, your primary care physician will handle any further medical issues. Please note that NO REFILLS for any discharge medications will be authorized once you are discharged, as it is imperative that you return to your primary care physician (or establish a relationship with a primary care physician if you do not have one) for your post hospital discharge needs so that they can reassess your need for medications and monitor your lab values.   Increase activity slowly   Complete by: As directed       Allergies as of 07/16/2021       Reactions   Lipitor [atorvastatin] Other (See Comments)   "Makes pt feel funky"        Medication List     STOP taking these medications    HYDROcodone-acetaminophen 7.5-325 MG tablet Commonly known as: NORCO   ibuprofen 200 MG tablet Commonly known as: ADVIL       TAKE these medications    aspirin 81 MG chewable tablet Chew 81 mg by mouth daily.   carvedilol 3.125 MG tablet Commonly known as: COREG Take 1 tablet (3.125 mg total) by mouth 2 (two) times daily with a meal.   cholecalciferol 25 MCG (1000  UNIT) tablet Commonly known as: VITAMIN D3 Take 1,000 Units by mouth daily.   nitroGLYCERIN 0.4 MG SL tablet Commonly known as: Nitrostat Place 1 tablet (0.4 mg total) under the tongue every 5 (five) minutes as needed for chest pain.   rosuvastatin 40 MG tablet Commonly known as: CRESTOR Take 1 tablet (40 mg total) by mouth daily. What changed:  medication strength how much to take   sacubitril-valsartan 24-26 MG Commonly known as: ENTRESTO Take 1 tablet by mouth 2 (two) times daily.   tadalafil 10 MG tablet Commonly known as: CIALIS Take 1-2 tablets (10-20 mg total) by mouth daily as needed. What changed: reasons to take this   ticagrelor 90 MG Tabs tablet Commonly known as: BRILINTA Take 1 tablet (90 mg total) by mouth 2 (two) times daily.        Follow-up Information     [name], FNP. Schedule an appointment as soon as possible for a visit in 1 week(s).   Specialty: Family Medicine Contact information:  [place] [address] [phone]         Nahser, Wonda Cheng, MD Follow up.   Specialty: Cardiology Why: Office will call with date/time, If you dont hear from them,please give them a call Contact information: [address] [place] [address] [phone]                Allergies  Allergen Reactions   Lipitor [Atorvastatin] Other (See Comments)    "Makes pt feel funky"      Consultations: Cards   Other Procedures/Studies: CARDIAC CATHETERIZATION  Result Date: 07/15/2021 Images from the original result were not included.   Mid LAD-2 lesion is 40% stenosed.   Dist RCA-1 lesion is 90% stenosed.   Mid LAD-1 lesion is 40% stenosed.   Dist RCA-2 lesion is 95% stenosed.   A drug-eluting stent was successfully placed.   Post intervention, there is a 0% residual stenosis.   Post intervention, there is a 0% residual stenosis. Barnett Elzey. is a 75 y.o. male  [id number] LOCATION:  FACILITY: [facility] PHYSICIAN:  Quay Burow, M.D. 09-15-1946 DATE OF PROCEDURE:  07/15/2021 DATE OF DISCHARGE: CARDIAC CATHETERIZATION / PCI DES Distal RCA History obtained from chart review.  75 year old male with past medical history of coronary artery disease, hyperlipidemia admitted with non-ST elevation myocardial infarction.  Also with history of ischemic cardiomyopathy with ejection fraction on most recent echocardiogram in March 2020 45 to 50%.  He has had prior stents to his mid LAD and distal RCA.  He was admitted with chest pain and had positive enzymes with troponins in the 600 range.  2D echo revealed an EF of 25% representing a significant decline from March 2020.  He is referred for diagnostic coronary angiography. PROCEDURE DESCRIPTION: The patient was brought to the second floor [place] Cardiac cath lab in the postabsorptive state. He was premedicated with IV Versed and fentanyl. His right groin was prepped and shaved in usual sterile fashion. Xylocaine 1% was used for local anesthesia.  I initially try to access the right radial artery both by palpation and ultrasound but was unable to thread a wire.  A 5 French sheath was inserted into the right common femoral artery using standard Seldinger technique.  5 French right left Judkins diagnostic catheters with a 5 French pigtail catheter used for selective coronary angiography and obtain left heart pressures.  Isovue dye was used for the entirety of the case ( 110 cc total to patient).  Retrograde aortic, left ventricular and pullback pressures were recorded. The culprit vessel was identified IC distal RCA just before the takeoff of the PDA as well as significant "ISR.  The patient was loaded with Brilinta 180 mg p.o.  Total of 10,000 units of heparin was administered with ACT of 306.  A 6 French AL 0.75 guide was used along with 0.14 Prowater guidewire and a 2 mm x 12 m balloon.  I was unable to cross the distal stent and therefore elected to place a guide liner which I was  able to put in the mid to distal RCA.  I then performed Cutting Balloon atherectomy with a 2.5 mm x 12 mm long score flex Cutting Balloon.  I then dilated the entire stent as well as the new Denovo lesion with a 2.5 mm x 8 mm long noncompliant balloon.  I placed a 2.5 x 8 mm long Medtronic frontier drug-eluting stent in the distal RCA overlapping the previous stent and then  placed a 2.5 x 15 mm long Medtronic frontier drug-eluting stent within the previously placed stent overlapping the new stent, both deployed at 16 to 18 atm.  I then postdilated the entire stented segment with a 2.75 x 12 mm long noncompliant balloon up to 18 atm (2.83 mm) resulting in reduction of a distal 95% de novo lesion just before the takeoff of the PDA 0% residual and to his diffuse ISR to 0% residual.  The patient tolerated procedure well.  The guidewire was removed.  The guide liner was removed.  The guide catheter was removed.  The sheath was sewn securely in place.   Mr. Pinkerton has unchanged anatomy in his left system.  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  The PLA which Dr. Saunders Revel attempted to cross 2 years ago was patent with TIMI I-II flow and a 99% stenosis which is similar to how he left it.  I was able to stent the De novo lesion and stent the previously placed RCA stent for high-grade "ISR" with 2 overlapping drug-eluting stents post dilating the entire segment to 2.83 mm.  This was facilitated with an AL 0.75 guide, Cutting Balloon atherectomy and a guide liner placed in the distal RCA.  Patient tolerated procedure well.  He will need uninterrupted dual antiplatelet therapy for at least 12 months if not indefinitely.  It is unclear whether his LV dysfunction is related to the RCA or true, true and unrelated but nevertheless he will need guideline directed optimal medical therapy for his EF of 25%.  I have communicated the results to Dr. Tamala Julian, his attending  cardiologist.  He left the lab in stable condition. Quay Burow. MD, [facility] 07/15/2021 12:12 PM    DG Chest Port 1 View  Result Date: 07/13/2021 CLINICAL DATA:  Chest pain EXAM: PORTABLE CHEST 1 VIEW COMPARISON:  03/13/2020 FINDINGS: Paucity of vasculature within the right lung apex is in keeping with asymmetric changes of emphysema. Lungs are clear. No pneumothorax or pleural effusion. Cardiac size within normal limits. No acute bone abnormality. IMPRESSION: No active disease. Emphysema. Electronically Signed   By: Fidela Salisbury M.D.   On: 07/13/2021 03:35   ECHOCARDIOGRAM COMPLETE  Result Date: 07/14/2021    ECHOCARDIOGRAM REPORT   Patient Name:   Regan Llorente. Date of Exam: 07/14/2021 Medical Rec #:  [id number]           Height:       66.0 in Accession #:    [id number]          Weight:       140.2 lb Date of Birth:  [birth year]           BSA:          1.720 m Patient Age:    78 years            BP:           128/70 mmHg Patient Gender: M                   HR:           60 bpm. Exam Location:  Inpatient Procedure: 2D Echo, Color Doppler and Cardiac Doppler Indications:    NSTEMI  History:        Patient has prior history of Echocardiogram examinations, most  recent 11/15/2018. CAD and Previous Myocardial Infarction,                 Signs/Symptoms:Chest Pain; Risk Factors:Dyslipidemia.  Sonographer:    Melissa Morford RDCS (AE, PE) Referring Phys: [id number] VASUNDHRA [place]  1. Left ventricular ejection fraction, by estimation, is 25%. The left ventricle has severely decreased function. The left ventricle demonstrates regional wall motion abnormalities (while all walls are hypokinetic, inferior territories are worse; this is concerning for multi-vessel disease). Left ventricular diastolic parameters are consistent with Grade II diastolic dysfunction (pseudonormalization).  2. Right ventricular systolic function is normal. The right ventricular size is normal.  3. The mitral  valve is normal in structure. Mild to moderate mitral valve regurgitation. No evidence of mitral stenosis. Regurgitation appears to be related to posterior restriction.  4. The aortic valve is tricuspid. Aortic valve regurgitation is not visualized. Mild aortic valve sclerosis is present, with no evidence of aortic valve stenosis.  5. The inferior vena cava is normal in size with greater than 50% respiratory variability, suggesting right atrial pressure of 3 mmHg. Comparison(s): A prior study was performed on 11/15/2018. Prior images reviewed side by side. LV function as worsened. FINDINGS  Left Ventricle: Left ventricular ejection fraction, by estimation, is 25%. The left ventricle has severely decreased function. The left ventricle demonstrates regional wall motion abnormalities. The left ventricular internal cavity size was normal in size. There is no left ventricular hypertrophy. Left ventricular diastolic parameters are consistent with Grade II diastolic dysfunction (pseudonormalization). Right Ventricle: The right ventricular size is normal. No increase in right ventricular wall thickness. Right ventricular systolic function is normal. Left Atrium: Left atrial size was normal in size. Right Atrium: Right atrial size was normal in size. Pericardium: There is no evidence of pericardial effusion. Mitral Valve: The mitral valve is normal in structure. Mild to moderate mitral valve regurgitation. No evidence of mitral valve stenosis. Tricuspid Valve: The tricuspid valve is normal in structure. Tricuspid valve regurgitation is not demonstrated. Aortic Valve: The aortic valve is tricuspid. Aortic valve regurgitation is not visualized. Mild aortic valve sclerosis is present, with no evidence of aortic valve stenosis. Pulmonic Valve: The pulmonic valve was not well visualized. Pulmonic valve regurgitation is not visualized. No evidence of pulmonic stenosis. Aorta: The aortic root is normal in size and structure. Venous:  The inferior vena cava is normal in size with greater than 50% respiratory variability, suggesting right atrial pressure of 3 mmHg. IAS/Shunts: The atrial septum is grossly normal.  LEFT VENTRICLE PLAX 2D LVIDd:         5.30 cm   Diastology LVIDs:         4.70 cm   LV e' medial:    4.90 cm/s LV PW:         0.70 cm   LV E/e' medial:  14.3 LV IVS:        0.80 cm   LV e' lateral:   9.46 cm/s LVOT diam:     2.10 cm   LV E/e' lateral: 7.4 LV SV:         50 LV SV Index:   29 LVOT Area:     3.46 cm  RIGHT VENTRICLE RV S prime:     11.60 cm/s TAPSE (M-mode): 1.8 cm LEFT ATRIUM             Index        RIGHT ATRIUM           Index LA diam:  4.20 cm 2.44 cm/m   RA Area:     17.50 cm LA Vol (A2C):   55.4 ml 32.22 ml/m  RA Volume:   47.60 ml  27.68 ml/m LA Vol (A4C):   58.2 ml 33.85 ml/m LA Biplane Vol: 57.3 ml 33.32 ml/m  AORTIC VALVE LVOT Vmax:   72.60 cm/s LVOT Vmean:  50.500 cm/s LVOT VTI:    0.145 m  AORTA Ao Root diam: 3.00 cm MITRAL VALVE MV Area (PHT): 3.93 cm    SHUNTS MV Decel Time: 193 msec    Systemic VTI:  0.14 m MV E velocity: 70.30 cm/s  Systemic Diam: 2.10 cm MV A velocity: 53.80 cm/s MV E/A ratio:  1.31 Rudean Haskell MD Electronically signed by Rudean Haskell MD Signature Date/Time: 07/14/2021/11:24:47 AM    Final    ABORTED INVASIVE LAB PROCEDURE  Result Date: 07/15/2021 This case was aborted.    TODAY-DAY OF DISCHARGE:  Subjective:   Wille Aubuchon today has no headache,no chest abdominal pain,no new weakness tingling or numbness, feels much better wants to go home today.  Objective:   Blood pressure 117/78, pulse 61, temperature 97.8 F (36.6 C), temperature source Oral, resp. rate 17, height '5\' 6"'  (1.676 m), weight 59.8 kg, SpO2 94 %.  Intake/Output Summary (Last 24 hours) at 07/16/2021 1056 Last data filed at 07/16/2021 0800 Gross per 24 hour  Intake 1099.87 ml  Output 1425 ml  Net -325.13 ml   Filed Weights   07/13/21 1542 07/15/21 0446 07/16/21 0421   Weight: 63.6 kg 60.5 kg 59.8 kg    Exam: Awake Alert, Oriented *3, No new F.N deficits, Normal affect [place].AT,PERRAL Supple Neck,No JVD, No cervical lymphadenopathy appriciated.  Symmetrical Chest wall movement, Good air movement bilaterally, CTAB RRR,No Gallops,Rubs or new Murmurs, No Parasternal Heave +ve B.Sounds, Abd Soft, Non tender, No organomegaly appriciated, No rebound -guarding or rigidity. No Cyanosis, Clubbing or edema, No new Rash or bruise   PERTINENT RADIOLOGIC STUDIES: CARDIAC CATHETERIZATION  Result Date: 07/15/2021 Images from the original result were not included.   Mid LAD-2 lesion is 40% stenosed.   Dist RCA-1 lesion is 90% stenosed.   Mid LAD-1 lesion is 40% stenosed.   Dist RCA-2 lesion is 95% stenosed.   A drug-eluting stent was successfully placed.   Post intervention, there is a 0% residual stenosis.   Post intervention, there is a 0% residual stenosis. Shloma Roggenkamp. is a 75 y.o. male  [id number] LOCATION:  FACILITY: [facility] PHYSICIAN: Quay Burow, M.D. [birth year] DATE OF PROCEDURE:  07/15/2021 DATE OF DISCHARGE: CARDIAC CATHETERIZATION / PCI DES Distal RCA History obtained from chart review.  75 year old male with past medical history of coronary artery disease, hyperlipidemia admitted with non-ST elevation myocardial infarction.  Also with history of ischemic cardiomyopathy with ejection fraction on most recent echocardiogram in March 2020 45 to 50%.  He has had prior stents to his mid LAD and distal RCA.  He was admitted with chest pain and had positive enzymes with troponins in the 600 range.  2D echo revealed an EF of 25% representing a significant decline from March 2020.  He is referred for diagnostic coronary angiography. PROCEDURE DESCRIPTION: The patient was brought to the second floor [place] Cardiac cath lab in the postabsorptive state. He was premedicated with IV Versed and fentanyl. His right groin was prepped and shaved in usual sterile fashion.  Xylocaine 1% was used for local anesthesia.  I initially try to access the right radial artery both by  palpation and ultrasound but was unable to thread a wire.  A 5 French sheath was inserted into the right common femoral artery using standard Seldinger technique.  5 French right left Judkins diagnostic catheters with a 5 French pigtail catheter used for selective coronary angiography and obtain left heart pressures.  Isovue dye was used for the entirety of the case ( 110 cc total to patient).  Retrograde aortic, left ventricular and pullback pressures were recorded. The culprit vessel was identified IC distal RCA just before the takeoff of the PDA as well as significant "ISR.  The patient was loaded with Brilinta 180 mg p.o.  Total of 10,000 units of heparin was administered with ACT of 306.  A 6 French AL 0.75 guide was used along with 0.14 Prowater guidewire and a 2 mm x 12 m balloon.  I was unable to cross the distal stent and therefore elected to place a guide liner which I was able to put in the mid to distal RCA.  I then performed Cutting Balloon atherectomy with a 2.5 mm x 12 mm long score flex Cutting Balloon.  I then dilated the entire stent as well as the new Denovo lesion with a 2.5 mm x 8 mm long noncompliant balloon.  I placed a 2.5 x 8 mm long Medtronic frontier drug-eluting stent in the distal RCA overlapping the previous stent and then placed a 2.5 x 15 mm long Medtronic frontier drug-eluting stent within the previously placed stent overlapping the new stent, both deployed at 16 to 18 atm.  I then postdilated the entire stented segment with a 2.75 x 12 mm long noncompliant balloon up to 18 atm (2.83 mm) resulting in reduction of a distal 95% de novo lesion just before the takeoff of the PDA 0% residual and to his diffuse ISR to 0% residual.  The patient tolerated procedure well.  The guidewire was removed.  The guide liner was removed.  The guide catheter was removed.  The sheath was sewn securely  in place.   Mr. Orth has unchanged anatomy in his left system.  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  The PLA which Dr. Saunders Revel attempted to cross 2 years ago was patent with TIMI I-II flow and a 99% stenosis which is similar to how he left it.  I was able to stent the De novo lesion and stent the previously placed RCA stent for high-grade "ISR" with 2 overlapping drug-eluting stents post dilating the entire segment to 2.83 mm.  This was facilitated with an AL 0.75 guide, Cutting Balloon atherectomy and a guide liner placed in the distal RCA.  Patient tolerated procedure well.  He will need uninterrupted dual antiplatelet therapy for at least 12 months if not indefinitely.  It is unclear whether his LV dysfunction is related to the RCA or true, true and unrelated but nevertheless he will need guideline directed optimal medical therapy for his EF of 25%.  I have communicated the results to Dr. Tamala Julian, his attending cardiologist.  He left the lab in stable condition. Quay Burow. MD, [facility] 07/15/2021 12:12 PM    ABORTED INVASIVE LAB PROCEDURE  Result Date: 07/15/2021 This case was aborted.    PERTINENT LAB RESULTS: CBC: Recent Labs    07/15/21 0507 07/15/21 1151 07/16/21 0048  WBC 7.0  --  7.7  HGB 15.1 13.9 15.1  HCT 43.8 41.0 43.6  PLT 173  --  169   CMET CMP     Component Value Date/Time   NA 137 07/16/2021 0048   NA 140 07/09/2021 1030   K 3.9 07/16/2021 0048   CL 108 07/16/2021 0048   CO2 21 (L) 07/16/2021 0048   GLUCOSE 89 07/16/2021 0048   BUN 22 07/16/2021 0048   BUN 14 07/09/2021 1030   CREATININE 0.96 07/16/2021 0048   CREATININE 0.93 03/29/2013 0923   CALCIUM 8.7 (L) 07/16/2021 0048   PROT 6.2 (L) 07/13/2021 0303   PROT 6.4 07/09/2021 1030   ALBUMIN 3.5 07/13/2021 0303   ALBUMIN 4.1 07/09/2021 1030   AST 23 07/13/2021 0303   ALT 23 07/13/2021 0303   ALKPHOS 57 07/13/2021 0303   BILITOT 0.8  07/13/2021 0303   BILITOT 0.4 07/09/2021 1030   GFRNONAA >60 07/16/2021 0048   GFRNONAA 85 03/29/2013 0923   GFRAA 104 05/01/2020 1031   GFRAA >89 03/29/2013 0923    GFR Estimated Creatinine Clearance: 56.2 mL/min (by C-G formula based on SCr of 0.96 mg/dL). No results for input(s): LIPASE, AMYLASE in the last 72 hours. No results for input(s): CKTOTAL, CKMB, CKMBINDEX, TROPONINI in the last 72 hours. Invalid input(s): POCBNP No results for input(s): DDIMER in the last 72 hours. No results for input(s): HGBA1C in the last 72 hours. No results for input(s): CHOL, HDL, LDLCALC, TRIG, CHOLHDL, LDLDIRECT in the last 72 hours. No results for input(s): TSH, T4TOTAL, T3FREE, THYROIDAB in the last 72 hours.  Invalid input(s): FREET3 No results for input(s): VITAMINB12, FOLATE, FERRITIN, TIBC, IRON, RETICCTPCT in the last 72 hours. Coags: No results for input(s): INR in the last 72 hours.  Invalid input(s): PT Microbiology: Recent Results (from the past 240 hour(s))  Resp Panel by RT-PCR (Flu A&B, Covid) Nasopharyngeal Swab     Status: None   Collection Time: 07/13/21  3:03 AM   Specimen: Nasopharyngeal Swab; Nasopharyngeal(NP) swabs in vial transport medium  Result Value Ref Range Status   SARS Coronavirus 2 by RT PCR NEGATIVE NEGATIVE Final    Comment: (NOTE) SARS-CoV-2 target nucleic acids are NOT DETECTED.  The SARS-CoV-2 RNA is generally detectable in upper respiratory specimens during the acute phase of infection. The lowest concentration of SARS-CoV-2 viral copies this assay can detect is 138 copies/mL. A negative result does not preclude SARS-Cov-2 infection and should not be used as the sole basis for treatment or other patient management decisions. A negative result may occur with  improper specimen collection/handling, submission of specimen other than nasopharyngeal swab, presence of viral mutation(s) within the areas targeted by this assay, and inadequate number of  viral copies(<138 copies/mL). A negative result must be combined with clinical observations, patient history, and epidemiological information. The expected result is Negative.  Fact Sheet for Patients:  EntrepreneurPulse.com.au  Fact Sheet for Healthcare Providers:  IncredibleEmployment.be  This test is no t yet approved or cleared by the [place] FDA and  has been authorized for detection and/or diagnosis of SARS-CoV-2 by FDA under an Emergency Use Authorization (EUA). This EUA will remain  in effect (meaning this test can be used) for the duration of the COVID-19 declaration under Section 564(b)(1) of the Act, 21 U.S.C.section 360bbb-3(b)(1), unless the authorization is terminated  or revoked sooner.       Influenza A by PCR NEGATIVE NEGATIVE Final   Influenza B by PCR NEGATIVE NEGATIVE Final    Comment: (NOTE) The Xpert Xpress SARS-CoV-2/FLU/RSV plus assay is intended as an aid in the diagnosis of influenza from  Nasopharyngeal swab specimens and should not be used as a sole basis for treatment. Nasal washings and aspirates are unacceptable for Xpert Xpress SARS-CoV-2/FLU/RSV testing.  Fact Sheet for Patients: EntrepreneurPulse.com.au  Fact Sheet for Healthcare Providers: IncredibleEmployment.be  This test is not yet approved or cleared by the [place] FDA and has been authorized for detection and/or diagnosis of SARS-CoV-2 by FDA under an Emergency Use Authorization (EUA). This EUA will remain in effect (meaning this test can be used) for the duration of the COVID-19 declaration under Section 564(b)(1) of the Act, 21 U.S.C. section 360bbb-3(b)(1), unless the authorization is terminated or revoked.  Performed at [facility], [place] [address] [place], [place] [address]   Surgical pcr screen     Status: None   Collection Time: 07/15/21  4:09 AM   Specimen: Nasal Mucosa; Nasal Swab   Result Value Ref Range Status   MRSA, PCR NEGATIVE NEGATIVE Final   Staphylococcus aureus NEGATIVE NEGATIVE Final    Comment: (NOTE) The Xpert SA Assay (FDA approved for NASAL specimens in patients 83 years of age and older), is one component of a comprehensive surveillance program. It is not intended to diagnose infection nor to guide or monitor treatment. Performed at [facility], [place] [address] [place],  [address]     FURTHER DISCHARGE INSTRUCTIONS:  Get Medicines reviewed and adjusted: Please take all your medications with you for your next visit with your Primary MD  Laboratory/radiological data: Please request your Primary MD to go over all hospital tests and procedure/radiological results at the follow up, please ask your Primary MD to get all Hospital records sent to his/her office.  In some cases, they will be blood work, cultures and biopsy results pending at the time of your discharge. Please request that your primary care M.D. goes through all the records of your hospital data and follows up on these results.  Also Note the following: If you experience worsening of your admission symptoms, develop shortness of breath, life threatening emergency, suicidal or homicidal thoughts you must seek medical attention immediately by calling 911 or calling your MD immediately  if symptoms less severe.  You must read complete instructions/literature along with all the possible adverse reactions/side effects for all the Medicines you take and that have been prescribed to you. Take any new Medicines after you have completely understood and accpet all the possible adverse reactions/side effects.   Do not drive when taking Pain medications or sleeping medications (Benzodaizepines)  Do not take more than prescribed Pain, Sleep and Anxiety Medications. It is not advisable to combine anxiety,sleep and pain medications without talking with your primary care  practitioner  Special Instructions: If you have smoked or chewed Tobacco  in the last 2 yrs please stop smoking, stop any regular Alcohol  and or any Recreational drug use.  Wear Seat belts while driving.  Please note: You were cared for by a hospitalist during your hospital stay. Once you are discharged, your primary care physician will handle any further medical issues. Please note that NO REFILLS for any discharge medications will be authorized once you are discharged, as it is imperative that you return to your primary care physician (or establish a relationship with a primary care physician if you do not have one) for your post hospital discharge needs so that they can reassess your need for medications and monitor your lab values.  Total Time spent coordinating discharge including counseling, education and face to face time equals  35 minutes.  SignedOren Binet 07/16/2021 10:56 AM

## 2021-07-16 NOTE — Progress Notes (Signed)
CARDIAC REHAB PHASE I   PRE:  Rate/Rhythm: 70 SR    BP: sitting 117/78    SaO2: 94 RA  MODE:  Ambulation: 300 ft   POST:  Rate/Rhythm: 86 SR with PVCs    BP: sitting 128/92     SaO2: 95 RA  Pt ambulated without c/o. Discussed MI, stents, Brilinta importance, low sodium/increasing protein, exercise, NTG, daily wts, and CRPII. Pt receptive to meds and in depth discussion of diet. He feels he is too busy/active for more formal exercise. Not interested in CRPII but will refer to AP.  0750-0900   Harriet Masson CES, ACSM 07/16/2021 8:56 AM

## 2021-07-16 NOTE — Care Management Important Message (Signed)
Important Message  Patient Details  Name: [name] [name]. MRN: [id number] Date of Birth: [birth year]   Medicare Important Message Given:  Yes     [name] 07/16/2021, 12:50 PM

## 2021-07-16 NOTE — TOC Benefit Eligibility Note (Signed)
Patient Product/process development scientist completed.    The patient is currently admitted and upon discharge could be taking Entresto 24-26 mg.  The current 30 day co-pay is, $47.00.   The patient is currently admitted and upon discharge could be taking Brilinta 90 mg.  The current 30 day co-pay is, $47.00.   The patient is insured through [employer] Part D     Roland Earl, CPhT Pharmacy Patient Advocate Specialist [facility] Patient Advocate Team Direct Number: [phone]  Fax: [phone]

## 2021-07-16 NOTE — TOC Transition Note (Signed)
Transition of Care [facility]) - CM/SW Discharge Note   Patient Details  Name: [name] [name]. MRN: [id number] Date of Birth: 07/21/1946  Transition of Care [facility]) CM/SW Contact:  Leone Haven, RN Phone Number: 07/16/2021, 1:48 PM   Clinical Narrative:    Patient is for dc today, will be on brilinta, TOC pharmacy filling medications , his copay is 45.00 .     Final next level of care: Home/Self Care Barriers to Discharge: No Barriers Identified   Patient Goals and CMS Choice        Discharge Placement                       Discharge Plan and Services                                     Social Determinants of Health (SDOH) Interventions     Readmission Risk Interventions No flowsheet data found.

## 2021-07-16 NOTE — Progress Notes (Addendum)
Progress Note  Patient Name: [name] [name]. Date of Encounter: 07/16/2021  [facility] Cardiologist: Kristeen Miss, MD   Subjective   Did well overnight.  No chest pain.  No femoral access site bleeding.  Itching to go home.  Is already had breakfast and walked.  Inpatient Medications    Scheduled Meds:  aspirin  81 mg Oral Daily   carvedilol  3.125 mg Oral BID WC   rosuvastatin  40 mg Oral Daily   sacubitril-valsartan  1 tablet Oral BID   sodium chloride flush  3 mL Intravenous Q12H   sodium chloride flush  3 mL Intravenous Q12H   ticagrelor  90 mg Oral BID   Continuous Infusions:  sodium chloride     PRN Meds: sodium chloride, acetaminophen, morphine injection, ondansetron (ZOFRAN) IV, sodium chloride flush   Vital Signs    Vitals:   07/15/21 2350 07/16/21 0405 07/16/21 0421 07/16/21 0731  BP: 140/67 125/76  117/78  Pulse: 60 63 63 61  Resp: 13 17 (!) 9 17  Temp: (!) 97.5 F (36.4 C) 97.8 F (36.6 C)  97.8 F (36.6 C)  TempSrc: Oral Oral  Oral  SpO2: 97% 93% 95% 94%  Weight:   59.8 kg   Height:        Intake/Output Summary (Last 24 hours) at 07/16/2021 0742 Last data filed at 07/16/2021 0700 Gross per 24 hour  Intake 859.87 ml  Output 1800 ml  Net -940.13 ml   Last 3 Weights 07/16/2021 07/15/2021 07/13/2021  Weight (lbs) 131 lb 13.4 oz 133 lb 6.4 oz 140 lb 3.2 oz  Weight (kg) 59.8 kg 60.51 kg 63.594 kg      Telemetry    Sinus with rare PVC - Personally Reviewed   Physical Exam  VASC: No femoral hematoma noted.  Pulses 2+. GEN: No acute distress.   Neck: No JVD Cardiac: RRR Respiratory: Clear to auscultation bilaterally. GI: Soft, nontender, non-distended  MS: No edema Neuro:  Nonfocal  Psych: Normal affect   Labs    High Sensitivity Troponin:   Recent Labs  Lab 07/13/21 0303 07/13/21 0450 07/13/21 0733  TROPONINIHS 40* 178* 647*     Chemistry Recent Labs  Lab 07/09/21 1030 07/09/21 1030 07/13/21 0303 07/14/21 0206  07/15/21 0507 07/15/21 1151 07/16/21 0048  NA 140   < > 139 137 136 142 137  K 5.2  --  4.6 4.4 3.8 3.9 3.9  CL 104  --  104 108 107  --  108  CO2 24  --  28 25 23   --  21*  GLUCOSE 87   < > 119* 107* 97  --  89  BUN 14   < > 21 20 17   --  22  CREATININE 0.87  --  0.97 0.85 0.89  --  0.96  CALCIUM 9.5  --  9.3 8.8* 8.9  --  8.7*  PROT 6.4  --  6.2*  --   --   --   --   ALBUMIN 4.1  --  3.5  --   --   --   --   AST 24  --  23  --   --   --   --   ALT 23  --  23  --   --   --   --   ALKPHOS 72  --  57  --   --   --   --   BILITOT 0.4  --  0.8  --   --   --   --   GFRNONAA  --    < > >60 >60 >60  --  >60  ANIONGAP  --    < > 7 4* 6  --  8   < > = values in this interval not displayed.    Lipids  Recent Labs  Lab 07/13/21 0303  CHOL 132  TRIG 83  HDL 39*  LDLCALC 76  CHOLHDL 3.4    Hematology Recent Labs  Lab 07/14/21 0206 07/15/21 0507 07/15/21 1151 07/16/21 0048  WBC 7.6 7.0  --  7.7  RBC 4.47 4.63  --  4.64  HGB 14.2 15.1 13.9 15.1  HCT 43.4 43.8 41.0 43.6  MCV 97.1 94.6  --  94.0  MCH 31.8 32.6  --  32.5  MCHC 32.7 34.5  --  34.6  RDW 12.9 12.6  --  12.6  PLT 168 173  --  169    CARDIOLOGY PROCEDURES    CARDIAC CATH 06/2021: Coronary Diagrams  Diagnostic Dominance: Right Intervention    LVEDP 10-19 mmHg.    ECHOCARDIOGRAM: 07/14/2021 IMPRESSIONS     1. Left ventricular ejection fraction, by estimation, is 25%. The left  ventricle has severely decreased function. The left ventricle demonstrates  regional wall motion abnormalities (while all walls are hypokinetic,  inferior territories are worse; this  is concerning for multi-vessel disease). Left ventricular diastolic  parameters are consistent with Grade II diastolic dysfunction  (pseudonormalization).   2. Right ventricular systolic function is normal. The right ventricular  size is normal.   3. The mitral valve is normal in structure. Mild to moderate mitral valve  regurgitation. No  evidence of mitral stenosis. Regurgitation appears to be  related to posterior restriction.   4. The aortic valve is tricuspid. Aortic valve regurgitation is not  visualized. Mild aortic valve sclerosis is present, with no evidence of  aortic valve stenosis.   5. The inferior vena cava is normal in size with greater than 50%  respiratory variability, suggesting right atrial pressure of 3 mmHg.    Patient Profile     76 year old male with past medical history of coronary artery disease, hyperlipidemia admitted with non-ST elevation myocardial infarction.  Also with history of ischemic cardiomyopathy with ejection fraction on most recent echocardiogram in March 2020 45 to 50%--> 25% this admission.  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  LV dysfunction is out of proportion to degree of coronary disease.  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  See above. Chronic systolic heart failure: Echocardiography demonstrates significant decline in LV systolic function from near normal to EF 25%.  Carvedilol started yesterday and Entresto low-dose will be started this AM.  If he tolerates therapy without significant hypotension he will be eligible for discharge this afternoon.  We will need to have a basic metabolic panel done in 7 to 10 days at [facility].   Hyperlipidemia-continue statin and aggressive risk factor modification including no smoking.    Needs follow-up with Dr. Jana Hakim 7 to 10 days with basic metabolic panel.   For questions or updates, please contact [place] Please consult [email or web address] for contact info under        Signed, Sinclair Grooms, MD  07/16/2021, 7:42 AM

## 2021-07-17 ENCOUNTER — Telehealth: Payer: Self-pay

## 2021-07-17 NOTE — Telephone Encounter (Signed)
Transition Care Management Follow-up Telephone Call Date of discharge and from where: [facility] 07/16/21 Diagnosis: NSTEMI How have you been since you were released from the hospital? Fine, just SOB, taking it easy Any questions or concerns? No  Items Reviewed: Did the pt receive and understand the discharge instructions provided? Yes  Medications obtained and verified? Yes  Other? No  Any new allergies since your discharge? No  Dietary orders reviewed? Yes Do you have support at home?  Lives alone, but son is near and has offered to stay with him, but he declines  Home Care and Equipment/Supplies: Were home health services ordered? no  Were any new equipment or medical supplies ordered?  No  Functional Questionnaire: (I = Independent and D = Dependent) ADLs: I  Bathing/Dressing- I  Meal Prep- I  Eating- I  Maintaining continence- I  Transferring/Ambulation- I  Managing Meds- I  Follow up appointments reviewed:  PCP Hospital f/u appt confirmed? Yes  Scheduled to see [name] on 07/25/21 @ 8:35. Specialist Hospital f/u appt confirmed? Yes  Scheduled to see Nahser, cardiology on 07/22/21 @ 1:40. Are transportation arrangements needed? No  If their condition worsens, is the pt aware to call PCP or go to the Emergency Dept.? Yes Was the patient provided with contact information for the PCP's office or ED? Yes Was to pt encouraged to call back with questions or concerns? Yes

## 2021-07-22 ENCOUNTER — Other Ambulatory Visit: Payer: Self-pay

## 2021-07-22 ENCOUNTER — Ambulatory Visit (INDEPENDENT_AMBULATORY_CARE_PROVIDER_SITE_OTHER): Payer: Medicare Other | Admitting: Cardiovascular Disease

## 2021-07-22 ENCOUNTER — Encounter: Payer: Self-pay | Admitting: Cardiovascular Disease

## 2021-07-22 VITALS — BP 108/60 | HR 88 | Ht 66.0 in | Wt 141.0 lb

## 2021-07-22 DIAGNOSIS — I5022 Chronic systolic (congestive) heart failure: Secondary | ICD-10-CM

## 2021-07-22 DIAGNOSIS — I214 Non-ST elevation (NSTEMI) myocardial infarction: Secondary | ICD-10-CM

## 2021-07-22 DIAGNOSIS — I2510 Atherosclerotic heart disease of native coronary artery without angina pectoris: Secondary | ICD-10-CM

## 2021-07-22 DIAGNOSIS — E782 Mixed hyperlipidemia: Secondary | ICD-10-CM

## 2021-07-22 DIAGNOSIS — I5041 Acute combined systolic (congestive) and diastolic (congestive) heart failure: Secondary | ICD-10-CM | POA: Insufficient documentation

## 2021-07-22 NOTE — Patient Instructions (Signed)
Medication Instructions:  Your physician recommends that you continue on your current medications as directed. Please refer to the Current Medication list given to you today.  *If you need a refill on your cardiac medications before your next appointment, please call your pharmacy*   Lab Work: NONE If you have labs (blood work) drawn today and your tests are completely normal, you will receive your results only by: MyChart Message (if you have MyChart) OR A paper copy in the mail If you have any lab test that is abnormal or we need to change your treatment, we will call you to review the results.   Testing/Procedures: NONE   Follow-Up: At [facility] you and your health needs are our priority.  As part of our continuing mission to provide you with exceptional heart care, we have created designated Provider Care Teams.  These Care Teams include your primary Cardiologist (physician) and Advanced Practice Providers (APPs -  Physician Assistants and Nurse Practitioners) who all work together to provide you with the care you need, when you need it.   Your next appointment:   3 month(s)  The format for your next appointment:   In Person  Provider:   Chelsea Aus, PA-C or Tereso Newcomer, PA-C  :1}
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
## 2021-07-25 ENCOUNTER — Encounter: Payer: Self-pay | Admitting: Family Medicine

## 2021-07-25 ENCOUNTER — Other Ambulatory Visit: Payer: Self-pay

## 2021-07-25 ENCOUNTER — Ambulatory Visit (INDEPENDENT_AMBULATORY_CARE_PROVIDER_SITE_OTHER): Payer: Medicare Other | Admitting: Family Medicine

## 2021-07-25 VITALS — BP 116/69 | HR 75 | Temp 96.7°F | Ht 66.0 in | Wt 143.2 lb

## 2021-07-25 DIAGNOSIS — Z09 Encounter for follow-up examination after completed treatment for conditions other than malignant neoplasm: Secondary | ICD-10-CM | POA: Diagnosis not present

## 2021-07-25 DIAGNOSIS — I5042 Chronic combined systolic (congestive) and diastolic (congestive) heart failure: Secondary | ICD-10-CM

## 2021-07-25 DIAGNOSIS — I252 Old myocardial infarction: Secondary | ICD-10-CM | POA: Diagnosis not present

## 2021-07-25 NOTE — Progress Notes (Signed)
Assessment & Plan:  1. Hospital discharge follow-up  2. History of non-ST elevation myocardial infarction (NSTEMI) Continue medications as prescribed by cardiology.  - CBC with Differential/Platelet - CMP14+EGFR  3. Chronic combined systolic and diastolic congestive heart failure (HCC) Well controlled on current regimen. Continue medications as prescribed by cardiology.  - CBC with Differential/Platelet - CMP14+EGFR   Hendricks Limes, MSN, APRN, FNP-C [facility]  Subjective:    Patient ID: [name] [name]., male    DOB: 28-Sep-1945, 75 y.o.   MRN: [id number]  Patient Care Team: [name], FNP as PCP - General (Family Medicine) Nahser, Wonda Cheng, MD as PCP - Cardiology (Cardiology) Shea Evans Norva Riffle, LCSW as Social Worker (Licensed Clinical Social Worker) Ilean China, RN as Case Scientist, physiological Complaint:  Chief Complaint  Patient presents with   Transitions Of Care    [facility] 07/16/21- NSTEMI    HPI: [name] [name]. is a 75 y.o. male presenting on 07/25/2021 for [facility] 07/16/21- NSTEMI)  Patient was admitted to [facility] 07/13/2021-07/16/2021 due to NSTEMI. He underwent LHC and PCI to RCA on 07/15/2021. EF 25% on 07/14/2021 - continue Entresto. He will continue dual antiplatelet (Brilinta and ASA), statin (rosuvastatin), and beta-blocker (Coreg) on discharge. He had a follow-up with cardiology on 07/22/2021. His Bilinta does make him moderately dyspneic per cardiology notes. Patient states the goal is to take Brilinta x3 months and then he can be transitioned to Plavix.   New complaints: None   Social history:  Relevant past medical, surgical, family and social history reviewed and updated as indicated. Interim medical history since our last visit reviewed.  Allergies and medications reviewed and updated.  DATA REVIEWED: CHART IN EPIC  ROS: Negative unless specifically indicated above in HPI.    Current  Outpatient Medications:    aspirin 81 MG chewable tablet, Chew 81 mg by mouth daily., Disp: , Rfl:    carvedilol (COREG) 3.125 MG tablet, Take 1 tablet (3.125 mg total) by mouth 2 (two) times daily with a meal., Disp: 60 tablet, Rfl: 3   cholecalciferol (VITAMIN D3) 25 MCG (1000 UNIT) tablet, Take 1,000 Units by mouth daily. , Disp: , Rfl:    nitroGLYCERIN (NITROSTAT) 0.4 MG SL tablet, Place 1 tablet (0.4 mg total) under the tongue every 5 (five) minutes as needed for chest pain., Disp: 25 tablet, Rfl: 2   rosuvastatin (CRESTOR) 40 MG tablet, Take 1 tablet (40 mg total) by mouth daily., Disp: 30 tablet, Rfl: 3   sacubitril-valsartan (ENTRESTO) 24-26 MG, Take 1 tablet by mouth 2 (two) times daily., Disp: 60 tablet, Rfl: 3   tadalafil (CIALIS) 10 MG tablet, Take 1-2 tablets (10-20 mg total) by mouth daily as needed., Disp: 10 tablet, Rfl: 2   ticagrelor (BRILINTA) 90 MG TABS tablet, Take 1 tablet (90 mg total) by mouth 2 (two) times daily., Disp: 60 tablet, Rfl: 3   Allergies  Allergen Reactions   Lipitor [Atorvastatin] Other (See Comments)    "Makes pt feel funky"   Past Medical History:  Diagnosis Date   Coronary artery disease    Erectile dysfunction    History of hiatal hernia    a lone time ago   Hyperlipidemia    [place] fracture [facility]) 03/22/2020   Myocardial infarction ([place])    x 2   Polio     Past Surgical History:  Procedure Laterality Date   APPENDECTOMY     CARDIAC  CATHETERIZATION     has two stents placed   CERVICAL SPINE SURGERY     c3-4, 4-5,  5-6, 8 screws and plates   CORONARY STENT INTERVENTION N/A 07/15/2021   Procedure: CORONARY STENT INTERVENTION;  Surgeon: Lorretta Harp, MD;  Location: [place] [facility];  Service: Cardiovascular;  Laterality: N/A;   CORONARY STENT PLACEMENT     forein body removal     metal sharp removed from right leg   LEFT HEART CATH AND CORONARY ANGIOGRAPHY N/A 11/15/2018   Procedure: LEFT HEART CATH AND CORONARY ANGIOGRAPHY;  Surgeon:  Nelva Bush, MD;  Location: [place] [facility];  Service: Cardiovascular;  Laterality: N/A;   LEFT HEART CATH AND CORONARY ANGIOGRAPHY N/A 07/15/2021   Procedure: LEFT HEART CATH AND CORONARY ANGIOGRAPHY;  Surgeon: Lorretta Harp, MD;  Location: [place] [facility];  Service: Cardiovascular;  Laterality: N/A;   MANDIBULAR HARDWARE REMOVAL Bilateral 04/23/2020   Procedure: MANDIBULAR HARDWARE REMOVAL;  Surgeon: Izora Gala, MD;  Location: [place];  Service: ENT;  Laterality: Bilateral;   ORIF MANDIBULAR FRACTURE N/A 03/14/2020   Procedure: OPEN REDUCTION INTERNAL FIXATION (ORIF) MID  FACE FRACTURE, MANDIBULAR FIXATION MODIFIED ARCH BARS;  Surgeon: Izora Gala, MD;  Location: [place];  Service: ENT;  Laterality: N/A;   SCAR REVISION N/A 04/23/2020   Procedure: SCAR REVISION/ Upper Lip Repair;  Surgeon: Izora Gala, MD;  Location: [place];  Service: ENT;  Laterality: N/A;    Social History   Socioeconomic History   Marital status: Divorced    Spouse name: Not on file   Number of children: Not on file   Years of education: Not on file   Highest education level: Not on file  Occupational History   Not on file  Tobacco Use   Smoking status: Former   Smokeless tobacco: Never  Vaping Use   Vaping Use: Never used  Substance and Sexual Activity   Alcohol use: Yes    Alcohol/week: 21.0 standard drinks    Types: 21 Cans of beer per week    Comment: 2 beers daily   Drug use: No   Sexual activity: Not on file  Other Topics Concern   Not on file  Social History Narrative   [name] has a restraining order against one of his sons.   Social Determinants of Health   Financial Resource Strain: Low Risk    Difficulty of Paying Living Expenses: Not very hard  Food Insecurity: Food Insecurity Present   Worried About Running Out of Food in the Last Year: Sometimes true   Ran Out of Food in the Last Year: Never true  Transportation Needs: No Transportation  Needs   Lack of Transportation (Medical): No   Lack of Transportation (Non-Medical): No  Physical Activity: Sufficiently Active   Days of Exercise per Week: 7 days   Minutes of Exercise per Session: 40 min  Stress: Stress Concern Present   Feeling of Stress : Rather much  Social Connections: Moderately Integrated   Frequency of Communication with Friends and Family: More than three times a week   Frequency of Social Gatherings with Friends and Family: More than three times a week   Attends Religious Services: More than 4 times per year   Active Member of [employer] or Organizations: Yes   Attends Music therapist: More than 4 times per year   Marital Status: Divorced  Human resources officer Violence: Not At Risk   Fear of Current  or Ex-Partner: No   Emotionally Abused: No   Physically Abused: No   Sexually Abused: No        Objective:    BP 116/69   Pulse 75   Temp (!) 96.7 F (35.9 C) (Temporal)   Ht '5\' 6"'  (1.676 m)   Wt 143 lb 3.2 oz (65 kg)   SpO2 98%   BMI 23.11 kg/m   Wt Readings from Last 3 Encounters:  07/25/21 143 lb 3.2 oz (65 kg)  07/22/21 141 lb (64 kg)  07/16/21 131 lb 13.4 oz (59.8 kg)    Physical Exam Vitals reviewed.  Constitutional:      General: He is not in acute distress.    Appearance: Normal appearance. He is normal weight. He is not ill-appearing, toxic-appearing or diaphoretic.  HENT:     Head: Normocephalic and atraumatic.  Eyes:     General: No scleral icterus.       Right eye: No discharge.        Left eye: No discharge.     Conjunctiva/sclera: Conjunctivae normal.  Cardiovascular:     Rate and Rhythm: Normal rate and regular rhythm.     Heart sounds: Normal heart sounds. No murmur heard.   No friction rub. No gallop.  Pulmonary:     Effort: Pulmonary effort is normal. No respiratory distress.     Breath sounds: Normal breath sounds. No stridor. No wheezing, rhonchi or rales.  Musculoskeletal:        General: Normal range of  motion.     Cervical back: Normal range of motion.  Skin:    General: Skin is warm and dry.  Neurological:     Mental Status: He is alert and oriented to person, place, and time. Mental status is at baseline.  Psychiatric:        Mood and Affect: Mood normal.        Behavior: Behavior normal.        Thought Content: Thought content normal.        Judgment: Judgment normal.    Lab Results  Component Value Date   TSH 0.704 01/02/2021   Lab Results  Component Value Date   WBC 7.7 07/16/2021   HGB 15.1 07/16/2021   HCT 43.6 07/16/2021   MCV 94.0 07/16/2021   PLT 169 07/16/2021   Lab Results  Component Value Date   NA 137 07/16/2021   K 3.9 07/16/2021   CO2 21 (L) 07/16/2021   GLUCOSE 89 07/16/2021   BUN 22 07/16/2021   CREATININE 0.96 07/16/2021   BILITOT 0.8 07/13/2021   ALKPHOS 57 07/13/2021   AST 23 07/13/2021   ALT 23 07/13/2021   PROT 6.2 (L) 07/13/2021   ALBUMIN 3.5 07/13/2021   CALCIUM 8.7 (L) 07/16/2021   ANIONGAP 8 07/16/2021   EGFR 90 07/09/2021   Lab Results  Component Value Date   CHOL 132 07/13/2021   Lab Results  Component Value Date   HDL 39 (L) 07/13/2021   Lab Results  Component Value Date   LDLCALC 76 07/13/2021   Lab Results  Component Value Date   TRIG 83 07/13/2021   Lab Results  Component Value Date   CHOLHDL 3.4 07/13/2021   Lab Results  Component Value Date   HGBA1C 5.2 07/13/2021

## 2021-07-26 LAB — CMP14+EGFR
ALT: 24 IU/L (ref 0–44)
AST: 23 IU/L (ref 0–40)
Albumin/Globulin Ratio: 1.5 (ref 1.2–2.2)
Albumin: 4 g/dL (ref 3.7–4.7)
Alkaline Phosphatase: 76 IU/L (ref 44–121)
BUN/Creatinine Ratio: 14 (ref 10–24)
BUN: 13 mg/dL (ref 8–27)
Bilirubin Total: 0.5 mg/dL (ref 0.0–1.2)
CO2: 25 mmol/L (ref 20–29)
Calcium: 9.8 mg/dL (ref 8.6–10.2)
Chloride: 107 mmol/L — ABNORMAL HIGH (ref 96–106)
Creatinine, Ser: 0.94 mg/dL (ref 0.76–1.27)
Globulin, Total: 2.6 g/dL (ref 1.5–4.5)
Glucose: 92 mg/dL (ref 70–99)
Potassium: 5.6 mmol/L — ABNORMAL HIGH (ref 3.5–5.2)
Sodium: 144 mmol/L (ref 134–144)
Total Protein: 6.6 g/dL (ref 6.0–8.5)
eGFR: 85 mL/min/{1.73_m2} (ref >59)

## 2021-07-26 LAB — CBC WITH DIFFERENTIAL/PLATELET
Basophils Absolute: 0.1 10*3/uL (ref 0.0–0.2)
Basos: 1 %
EOS (ABSOLUTE): 0.6 10*3/uL — ABNORMAL HIGH (ref 0.0–0.4)
Eos: 6 %
Hematocrit: 48.6 % (ref 37.5–51.0)
Hemoglobin: 16.3 g/dL (ref 13.0–17.7)
Immature Grans (Abs): 0 10*3/uL (ref 0.0–0.1)
Immature Granulocytes: 0 %
Lymphocytes Absolute: 2.3 10*3/uL (ref 0.7–3.1)
Lymphs: 21 %
MCH: 31.7 pg (ref 26.6–33.0)
MCHC: 33.5 g/dL (ref 31.5–35.7)
MCV: 94 fL (ref 79–97)
Monocytes Absolute: 1 10*3/uL — ABNORMAL HIGH (ref 0.1–0.9)
Monocytes: 10 %
Neutrophils Absolute: 6.7 10*3/uL (ref 1.4–7.0)
Neutrophils: 62 %
Platelets: 223 10*3/uL (ref 150–450)
RBC: 5.15 x10E6/uL (ref 4.14–5.80)
RDW: 12.1 % (ref 11.6–15.4)
WBC: 10.7 10*3/uL (ref 3.4–10.8)

## 2021-07-28 ENCOUNTER — Other Ambulatory Visit: Payer: Self-pay | Admitting: Family Medicine

## 2021-07-28 DIAGNOSIS — E875 Hyperkalemia: Secondary | ICD-10-CM

## 2021-07-30 ENCOUNTER — Other Ambulatory Visit (HOSPITAL_COMMUNITY): Payer: Self-pay

## 2021-07-31 ENCOUNTER — Other Ambulatory Visit: Payer: Medicare Other

## 2021-07-31 ENCOUNTER — Telehealth (HOSPITAL_COMMUNITY): Payer: Self-pay

## 2021-07-31 ENCOUNTER — Other Ambulatory Visit (HOSPITAL_COMMUNITY): Payer: Self-pay

## 2021-07-31 ENCOUNTER — Other Ambulatory Visit: Payer: Self-pay

## 2021-07-31 DIAGNOSIS — E875 Hyperkalemia: Secondary | ICD-10-CM

## 2021-07-31 NOTE — Telephone Encounter (Signed)
Pharmacy Transitions of Care Follow-up Telephone Call  Date of discharge: 07/16/21  Discharge Diagnosis: NSTEMI  How have you been since you were released from the hospital?  Patient has been having office visits for hyperkalemia. Had blood drawn this morning. Is not very happy since he's been told not to eat potatoes and beans but encouraged patient to stick to low potassium diet until potassium levels come down. Has been taking a whole tablet of Brilinta in the morning but only a half tablet at night. He is able to breathe easier at night if he only takes a half tablet. PCP is aware of this. Strongly encouraged patient to take both whole tablets and that patient will adjust to shortness of breath side effect over time.Has also noticed easier bleeding/bruising. Encouraged patient to share with PCP any updates and how much he is bleeding or bruising. Patient also stated he's been using ibuprofen. Encouraged patient to stick to Tylenol for pain as ibuprofen will increase risk of bleeding.  Medication changes made at discharge:     START taking: Brilinta (ticagrelor)  carvedilol (COREG)  Entresto (sacubitril-valsartan)  CHANGE how you take: rosuvastatin (CRESTOR)  STOP taking: HYDROcodone-acetaminophen 7.5-325 MG tablet (NORCO)  ibuprofen 200 MG tablet (ADVIL)   Medication changes verified by the patient? Yes    Medication Accessibility:  Home Pharmacy:  [facility]  Was the patient provided with refills on discharged medications? Yes   Have all prescriptions been transferred from [facility] to home pharmacy?  Yes  Is the patient able to afford medications? Patient has insurance Notable copays: Brilinta copay is $47    Medication Review:  TICAGRELOR (BRILINTA) Ticagrelor 90 mg BID initiated on 07/16/21.  - Educated patient on expected duration of therapy of aspirin with ticagrelor.  - Discussed importance of taking medication around the same time every day, - Advised  patient of medications to avoid (NSAIDs, aspirin maintenance doses>100 mg daily) - Educated that Tylenol (acetaminophen) will be the preferred analgesic to prevent risk of bleeding  - Emphasized importance of monitoring for signs and symptoms of bleeding (abnormal bruising, prolonged bleeding, nose bleeds, bleeding from gums, discolored urine, black tarry stools)  - Educated patient to notify doctor if shortness of breath or abnormal heartbeat occur - Advised patient to alert all providers of antiplatelet therapy prior to starting a new medication or having a procedure    Follow-up Appointments:  PCP Hospital f/u appt confirmed?  Had follow up with Dr. Alona Bene on 07/25/21. Scheduled to see  Dr. Alona Bene on 01/07/22 @ 8:35am.   Specialist Hospital f/u appt confirmed?  Patient had follow with Dr. Elease Hashimoto on 07/22/21 in Cardiology. Scheduled to see Dr. Alben Spittle on 10/23/21 @ 8:50am.   If their condition worsens, is the pt aware to call PCP or go to the Emergency Dept.? yes  Final Patient Assessment: Patient has follow up scheduled and refills at home pharmacy

## 2021-08-01 ENCOUNTER — Ambulatory Visit (INDEPENDENT_AMBULATORY_CARE_PROVIDER_SITE_OTHER): Payer: Medicare Other | Admitting: Licensed Clinical Social Worker

## 2021-08-01 DIAGNOSIS — S02412D LeFort II fracture, subsequent encounter for fracture with routine healing: Secondary | ICD-10-CM

## 2021-08-01 DIAGNOSIS — E782 Mixed hyperlipidemia: Secondary | ICD-10-CM

## 2021-08-01 DIAGNOSIS — I252 Old myocardial infarction: Secondary | ICD-10-CM

## 2021-08-01 DIAGNOSIS — I25119 Atherosclerotic heart disease of native coronary artery with unspecified angina pectoris: Secondary | ICD-10-CM

## 2021-08-01 LAB — BMP8+EGFR
BUN/Creatinine Ratio: 18 (ref 10–24)
BUN: 16 mg/dL (ref 8–27)
CO2: 22 mmol/L (ref 20–29)
Calcium: 9.2 mg/dL (ref 8.6–10.2)
Chloride: 105 mmol/L (ref 96–106)
Creatinine, Ser: 0.91 mg/dL (ref 0.76–1.27)
Glucose: 84 mg/dL (ref 70–99)
Potassium: 4.5 mmol/L (ref 3.5–5.2)
Sodium: 140 mmol/L (ref 134–144)
eGFR: 88 mL/min/{1.73_m2} (ref >59)

## 2021-08-01 NOTE — Chronic Care Management (AMB) (Signed)
Chronic Care Management    Clinical Social Work Note  08/01/2021 Name: [name] [name]. MRN: [id number] DOB: [birth year]  [name] [name]. is a 75 y.o. year old male who is a primary care patient of [name], FNP. The CCM team was consulted to assist the patient with chronic disease management and/or care coordination needs related to: [employer] .   Engaged with patient by telephone for follow up visit in response to provider referral for social work chronic care management and care coordination services.   Consent to Services:  The patient was given information about Chronic Care Management services, agreed to services, and gave verbal consent prior to initiation of services.  Please see initial visit note for detailed documentation.   Patient agreed to services and consent obtained.   Assessment: Review of patient past medical history, allergies, medications, and health status, including review of relevant consultants reports was performed today as part of a comprehensive evaluation and provision of chronic care management and care coordination services.     SDOH (Social Determinants of Health) assessments and interventions performed:  SDOH Interventions    Flowsheet Row Most Recent Value  SDOH Interventions   Food Insecurity Interventions Other (Comment)  [client has swallowing issues. He has to cut his solid food into small bites before he can eat it.  He is talking with insurance company trying to [employer] repair work as needed]  Stress Interventions Provide Counseling  [client has stress related to managing his dental needs. client has stress related to managing his health needs]  Depression Interventions/Treatment  --  [informed client of LCSW support and of RNCM support]        Advanced Directives Status: See Vyn[employer] application for related entries.  CCM Care Plan  Allergies  Allergen Reactions   Lipitor [Atorvastatin] Other (See Comments)     "Makes pt feel funky"    Outpatient Encounter Medications as of 08/01/2021  Medication Sig   aspirin 81 MG chewable tablet Chew 81 mg by mouth daily.   carvedilol (COREG) 3.125 MG tablet Take 1 tablet (3.125 mg total) by mouth 2 (two) times daily with a meal.   cholecalciferol (VITAMIN D3) 25 MCG (1000 UNIT) tablet Take 1,000 Units by mouth daily.    nitroGLYCERIN (NITROSTAT) 0.4 MG SL tablet Place 1 tablet (0.4 mg total) under the tongue every 5 (five) minutes as needed for chest pain.   rosuvastatin (CRESTOR) 40 MG tablet Take 1 tablet (40 mg total) by mouth daily.   sacubitril-valsartan (ENTRESTO) 24-26 MG Take 1 tablet by mouth 2 (two) times daily.   tadalafil (CIALIS) 10 MG tablet Take 1-2 tablets (10-20 mg total) by mouth daily as needed.   ticagrelor (BRILINTA) 90 MG TABS tablet Take 1 tablet (90 mg total) by mouth 2 (two) times daily.   No facility-administered encounter medications on file as of 08/01/2021.    Patient Active Problem List   Diagnosis Date Noted   Acute combined systolic and diastolic CHF, NYHA class 2 ([place]) 07/22/2021   Status post coronary artery stent placement    Chronic systolic heart failure ([place])    NSTEMI (non-ST elevated myocardial infarction) ([place]) 07/13/2021   Controlled substance agreement signed 01/07/2021   CAD (coronary artery disease) 11/16/2018   History of non-ST elevation myocardial infarction (NSTEMI)    Spondylolisthesis at L4-L5 level 06/08/2017   Coronary artery disease involving native coronary artery of native heart without angina pectoris 05/08/2017   Mixed hyperlipidemia 05/08/2017  Erectile dysfunction 03/29/2013    Conditions to be addressed/monitored: monitor client management of dental needs faced  Care Plan : LCSW Care Plan  Updates made by Katha Cabal, LCSW since 08/01/2021 12:00 AM     Problem: Coping Skills (General Plan of Care)      Goal: Coping Skills Enhanced: manage dental needs of client; manage daily  needs of client   Start Date: 08/01/2021  Expected End Date: 10/31/2021  This Visit's Progress: On track  Recent Progress: On track  Priority: Medium  Note:   Current barriers:   Patient in need of assistance with connecting to community resources for dental care needs Eating/Chewing Challenges Pain issues Financial Challenges  Clinical Goals:  patient will work with SW in next 30 days to address concerns related to dental needs of client Patient will communicate with SW in next 30 days to discuss ADLs completion for client Patient will call RNCM as needed for CCM nursing support in next 30 days  Clinical Interventions:  Collaboration with [name], FNP regarding development and update of comprehensive plan of care as evidenced by provider attestation and co-signature Reviewed previously with client his recent steps in addressing dental needs. He said he has had two appointments at [facility] related to evaluating his dental needs. Discussed dental needs status. Client said he is still trying to get dental needs met, is still trying to schedule dental repair work.  He said his dental repair work has not been completed yet. Discussed with Jasdeep recent care client received while hospitalized.  He said he had a heart attack. He said that during his hospitalization, 3 stints were placed.He said his energy is doing well for now since he is back home  Patient Strengths: Drives to needed appointments Takes medications as prescribed Researches dental recourses in the community Eats meals regularly Advocates for self and his needs  Patient Deficits Dental needs Difficulty chewing and swallowing Some pain issues  Patient Goals:  Attend scheduled medical appointments Talked with RNCM or LCSW as needed for support Take medications as prescribed Continue to research dental resources in the area for possible help with dental needs of client -  Follow Up Plan: LCSW to call  client on 10/01/21 at 11:15 AM to assess client needs.       Norva Riffle.Jayleah Garbers MSW, LCSW Licensed Clinical Social Worker [facility] Care Management [phone]

## 2021-08-01 NOTE — Patient Instructions (Addendum)
Visit Information  Patient Goals:  Protect My Health (Patient). Manage dental needs of client  Timeframe:  Short-Term Goal Priority:  Medium Progress: On Track Start Date:    08/01/21                       Expected End Date:   10/31/21               Follow Up Date 10/01/21 at 11:15 AM   Protect My Health (Patient) Manage dental needs of client    Why is this important?   Screening tests can find diseases early when they are easier to treat.  Your doctor or nurse will talk with you about which tests are important for you.  Getting shots for common diseases like the flu and shingles will help prevent them.     Patient Strengths: Drives to needed appointments Takes medications as prescribed Researches dental recourses in the community Eats meals regularly Advocates for self and his needs  Patient Deficits Dental needs Difficulty chewing and swallowing Some pain issues  Patient Goals:  Attend scheduled medical appointments Talked with RNCM or LCSW as needed for support Take medications as prescribed Continue to research dental resources in the area for possible help with dental needs of client -  Follow Up Plan: LCSW to call client on 10/01/21 at 11:15 AM to assess client needs   Kelton Pillar.Fabiana Dromgoole MSW, LCSW Licensed Clinical Social Worker [facility] Care Management [phone]

## 2021-08-12 ENCOUNTER — Telehealth: Payer: Self-pay | Admitting: Family Medicine

## 2021-08-12 NOTE — Telephone Encounter (Signed)
Pt c/o loose stools, can't make it to bathroom on time when urinating and a slight rash on shoulders. He is concerned that one of his new meds is causing this? Brilinta, Entresto or Coreg? He says he was just here but will come back if needed. He is embarrassed by the fact that he can't hold urine or stool and wants to stop meds! I said Do Not Stop annything!

## 2021-08-12 NOTE — Telephone Encounter (Signed)
I recommend he call his cardiologist as they are all medications prescribed by them.

## 2021-08-13 ENCOUNTER — Telehealth: Payer: Self-pay | Admitting: Cardiovascular Disease

## 2021-08-13 NOTE — Telephone Encounter (Signed)
Attempted to contact patient - NA °

## 2021-08-13 NOTE — Telephone Encounter (Signed)
Rash could theoretically be from any of the medications, though the incidence of rash is highest with carvedilol (still only <1% reported). Increased urinary frequency/urgency could be from [facility] given its natriuretic effects. Would not expect fecal incontinence with any of these medications. It does not sound like he is having blood in his stool since it has resolved and he was unclear if it was blood, but if concerned with that, Brilinta could cause bleeding. He needs to stay on antiplatelet given recent stent. Could consider switching to Plavix given it has been >30 days since stenting and he also reported dyspnea at his last visit which is more common with Brilinta. Could consider switching to alternative beta blocker or statin to see if symptoms improve. Could also consider switching Entresto to an ARB though would prefer he stay on Entresto if possible.   Recommend forwarding to MD for final medication recommendations.

## 2021-08-13 NOTE — Telephone Encounter (Signed)
Pt c/o medication issue:  1. Name of Medication:  sacubitril-valsartan (ENTRESTO) 24-26 MG ticagrelor (BRILINTA) 90 MG TABS tablet  rosuvastatin (CRESTOR) 40 MG tablet carvedilol (COREG) 3.125 MG tablet  2. How are you currently taking this medication (dosage and times per day)? Crestor 1 tablet daily, all others 1 tablet twice a day  3. Are you having a reaction (difficulty breathing--STAT)? no  4. What is your medication issue? Patient states he has a rash on shoulders, back, neck, and collarbones not sure if it is one of his medications. He says his main concern is about when he gets the urge to go to the bathroom he can't make it. He says he is "messing his britches up". Patient states he was told his potassium was high and was taken off any high potassium foods. He says he eats a lot of potatoes and beans, because he was raised on them and has quit to lower his potassium. He says he went back to get his labs checked the following Wednesday and it came back in the normal range. Patient states he wants to know what part of this medication is causing him to defecate on himself. He says his stool has changed and instead of it looking normal it is a lot shorter and smaller. He says he is also going 3-4 times a day. He states he has already gone 4 times today even though it is not much. He says the other day he saw what looked like a little bit of blood, but is not sure if it was ketchup. He says he cut out the ketchup and has not seen it since. He says he cannot hold in his bowels and will be walking to the bathroom, but cannot make it.

## 2021-08-14 DIAGNOSIS — E782 Mixed hyperlipidemia: Secondary | ICD-10-CM

## 2021-08-14 DIAGNOSIS — I252 Old myocardial infarction: Secondary | ICD-10-CM

## 2021-08-14 DIAGNOSIS — I25119 Atherosclerotic heart disease of native coronary artery with unspecified angina pectoris: Secondary | ICD-10-CM

## 2021-08-15 MED ORDER — BISOPROLOL FUMARATE 5 MG PO TABS: 2.5000 mg | ORAL_TABLET | Freq: Every day | ORAL | 3 refills | Status: DC

## 2021-08-15 NOTE — Telephone Encounter (Signed)
Pt is f/u on conversation from 08/14/21 Please call [phone]

## 2021-08-15 NOTE — Telephone Encounter (Signed)
   Per Dr. Elease Hashimoto:  [name], FNP has pointed out that Coreg can rarely cause diarrhea.  While all beta blockers may do this, lets DC coreg and start bisoprolol 2.5 mg a day and see if his symptons improve.   Thanks   PN

## 2021-08-15 NOTE — Telephone Encounter (Signed)
Outreach made to Pt.  Advised of change from carvedilol to bisoprolol.  Pt in agreement.  He is wondering if his potassium should be rechecked.    He is wondering if the diarrhea could be caused by eliminating potatoes "cold [place]" from his diet.  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    He would like to have his potassium checked by his PCP since it is much closer to him.  Advised I would send to his PCP for advisement.  Pt thanked nurse for call back.

## 2021-08-16 ENCOUNTER — Telehealth: Payer: Self-pay | Admitting: Family Medicine

## 2021-08-16 DIAGNOSIS — E875 Hyperkalemia: Secondary | ICD-10-CM

## 2021-08-16 NOTE — Telephone Encounter (Signed)
Attempted to contact patient - nA

## 2021-08-16 NOTE — Telephone Encounter (Signed)
Patient is requesting to have his potassium level re-checked. BMP ordered. Please make him aware he can come anytime to have this completed.

## 2021-08-19 ENCOUNTER — Other Ambulatory Visit: Payer: Medicare Other

## 2021-08-19 DIAGNOSIS — E875 Hyperkalemia: Secondary | ICD-10-CM

## 2021-08-19 LAB — BMP8+EGFR
BUN/Creatinine Ratio: 26 — ABNORMAL HIGH (ref 10–24)
BUN: 21 mg/dL (ref 8–27)
CO2: 24 mmol/L (ref 20–29)
Calcium: 9.5 mg/dL (ref 8.6–10.2)
Chloride: 109 mmol/L — ABNORMAL HIGH (ref 96–106)
Creatinine, Ser: 0.81 mg/dL (ref 0.76–1.27)
Glucose: 80 mg/dL (ref 70–99)
Potassium: 4.9 mmol/L (ref 3.5–5.2)
Sodium: 144 mmol/L (ref 134–144)
eGFR: 92 mL/min/{1.73_m2} (ref >59)

## 2021-08-19 NOTE — Telephone Encounter (Signed)
Pt was here for repeat K+ today

## 2021-08-30 ENCOUNTER — Ambulatory Visit (INDEPENDENT_AMBULATORY_CARE_PROVIDER_SITE_OTHER): Payer: Medicare Other | Admitting: *Deleted

## 2021-08-30 ENCOUNTER — Other Ambulatory Visit: Payer: Self-pay

## 2021-08-30 ENCOUNTER — Telehealth: Payer: Self-pay | Admitting: Cardiovascular Disease

## 2021-08-30 ENCOUNTER — Ambulatory Visit (INDEPENDENT_AMBULATORY_CARE_PROVIDER_SITE_OTHER): Payer: Medicare Other | Admitting: Family Medicine

## 2021-08-30 ENCOUNTER — Encounter: Payer: Self-pay | Admitting: Family Medicine

## 2021-08-30 VITALS — BP 128/67 | HR 57 | Temp 97.3°F | Ht 66.0 in | Wt 147.6 lb

## 2021-08-30 DIAGNOSIS — E875 Hyperkalemia: Secondary | ICD-10-CM | POA: Diagnosis not present

## 2021-08-30 DIAGNOSIS — L270 Generalized skin eruption due to drugs and medicaments taken internally: Secondary | ICD-10-CM | POA: Diagnosis not present

## 2021-08-30 DIAGNOSIS — E782 Mixed hyperlipidemia: Secondary | ICD-10-CM

## 2021-08-30 DIAGNOSIS — I5042 Chronic combined systolic (congestive) and diastolic (congestive) heart failure: Secondary | ICD-10-CM | POA: Diagnosis not present

## 2021-08-30 DIAGNOSIS — I252 Old myocardial infarction: Secondary | ICD-10-CM

## 2021-08-30 DIAGNOSIS — Z79899 Other long term (current) drug therapy: Secondary | ICD-10-CM

## 2021-08-30 DIAGNOSIS — I25119 Atherosclerotic heart disease of native coronary artery with unspecified angina pectoris: Secondary | ICD-10-CM

## 2021-08-30 DIAGNOSIS — I2510 Atherosclerotic heart disease of native coronary artery without angina pectoris: Secondary | ICD-10-CM

## 2021-08-30 MED ORDER — TRIAMCINOLONE ACETONIDE 0.1 % EX CREA
1.0000 | TOPICAL_CREAM | Freq: Two times a day (BID) | CUTANEOUS | 1 refills | Status: DC
Start: 2021-08-30 — End: 2023-09-17

## 2021-08-30 MED ORDER — FAMOTIDINE 20 MG PO TABS: 20.0000 mg | ORAL_TABLET | Freq: Two times a day (BID) | ORAL | 2 refills | Status: DC

## 2021-08-30 NOTE — Patient Instructions (Signed)
Visit Information  Patient Goals/Self-Care Activities: Take medications as prescribed   Attend all scheduled provider appointments Perform all self care activities independently  Perform IADL's (shopping, preparing meals, housekeeping, managing finances) independently Call provider office for new concerns or questions  - call doctor with any symptoms you believe are related to your medicine - call doctor when you experience any new symptoms Limit intake of high potassium foods Rck potassium level within the next few weeks to see if it's trending up or down Call PCP or seek medical attention for any new or worsening symptoms  Use fragrance free moisturizer like CeraVe Take cool showers and use fragrance free detergents and soaps Call RN Care Manager as needed [phone]  Patient verbalizes understanding of instructions provided today and agrees to view in MyChart.   Plan:Telephone follow up appointment with care management team member scheduled for:  09/04/21 with RNCM The patient has been provided with contact information for the care management team and has been advised to call with any health related questions or concerns.   Demetrios Loll, BSN, RN-BC Embedded Chronic Care Manager [facility] Care Management Direct Dial: [phone]

## 2021-08-30 NOTE — Progress Notes (Signed)
Assessment & Plan:  1. 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  Encouraged good thick lotion such as CeraVe, Aquaphor, Eucerin, or Cetaphil with triamcinolone cream twice daily.  Patient also to start taking famotidine 20 mg twice daily.  Discussed if the Brilinta is the cause of the rash then it will improve when cardiology takes him off of it. - triamcinolone cream (KENALOG) 0.1 %; Apply 1 application topically 2 (two) times daily.  Dispense: 80 g; Refill: 1 - famotidine (PEPCID) 20 MG tablet; Take 1 tablet (20 mg total) by mouth 2 (two) times daily.  Dispense: 60 tablet; Refill: 2  2-3. Chronic combined systolic and diastolic congestive heart failure (HCC)/History of non-ST elevation myocardial infarction (NSTEMI) Encouraged to pick up bisoprolol from the pharmacy and start taking since he stopped taking the Coreg.  4. Hyperkalemia - BMP8+EGFR; Future -patient is going to return in 2 weeks for repeat BMP as he plans to start incorporating small amounts of potatoes and beans back into his diet.   Follow up plan: Return if symptoms worsen or fail to improve.  Hendricks Limes, MSN, APRN, FNP-C [facility]  Subjective:   Patient ID: [name] [name]., male    DOB: [birth year], 75 y.o.   MRN: [id number]  HPI: [name] [name]. is a 75 y.o. male presenting on 08/30/2021 for Rash (Upper chest and upper back x 1 month )  Patient reports a rash on his shoulders, neck, chest, and back that developed after he was hospitalized at the end of October.  At that time he was started on Brilinta, [place], and Coreg.  He was also having bowel accidents, that started at the same time.  He did call his cardiologist for recommendations at which time he was told to see his PCP as their pharmacist did not think the meds were the cause of his symptoms.  I did consult with his cardiologist about the Coreg being a potential cause of his diarrhea.  His cardiologist  changed the Coreg to bisoprolol.  Patient reports he stopped taking the Coreg and his diarrhea and stool accidents resolved.  However, he did not pick up the bisoprolol from the pharmacy.  He states he saw something on his MyChart about stopping Brilinta and starting Plavix, but I am unable to find this looking at his MyChart on his phone or at the cardiology notes in [place].  As per the rash, patient states he does not scratch it, but pats it.  He has tried applying lotion, and states it made it turned really red.  Patient reports he had to stop eating potatoes and beans due to hyperkalemia after starting the Entresto.  He would like his potassium level rechecked today.   ROS: Negative unless specifically indicated above in HPI.   Relevant past medical history reviewed and updated as indicated.   Allergies and medications reviewed and updated.   Current Outpatient Medications:    aspirin 81 MG chewable tablet, Chew 81 mg by mouth daily., Disp: , Rfl:    bisoprolol (ZEBETA) 5 MG tablet, Take 0.5 tablets (2.5 mg total) by mouth daily., Disp: 45 tablet, Rfl: 3   cholecalciferol (VITAMIN D3) 25 MCG (1000 UNIT) tablet, Take 1,000 Units by mouth daily. , Disp: , Rfl:    HYDROcodone-acetaminophen (NORCO) 7.5-325 MG tablet, Take 1 tablet by mouth daily as needed., Disp: , Rfl:    nitroGLYCERIN (NITROSTAT) 0.4 MG SL tablet, Place 1 tablet (0.4 mg total) under the  tongue every 5 (five) minutes as needed for chest pain., Disp: 25 tablet, Rfl: 2   rosuvastatin (CRESTOR) 40 MG tablet, Take 1 tablet (40 mg total) by mouth daily., Disp: 30 tablet, Rfl: 3   sacubitril-valsartan (ENTRESTO) 24-26 MG, Take 1 tablet by mouth 2 (two) times daily., Disp: 60 tablet, Rfl: 3   tadalafil (CIALIS) 10 MG tablet, Take 1-2 tablets (10-20 mg total) by mouth daily as needed., Disp: 10 tablet, Rfl: 2   ticagrelor (BRILINTA) 90 MG TABS tablet, Take 1 tablet (90 mg total) by mouth 2 (two) times daily., Disp: 60 tablet, Rfl:  3  Allergies  Allergen Reactions   Lipitor [Atorvastatin] Other (See Comments)    "Makes pt feel funky"    Objective:   BP 128/67    Pulse (!) 57    Temp (!) 97.3 F (36.3 C) (Temporal)    Ht '5\' 6"'  (1.676 m)    Wt 147 lb 9.6 oz (67 kg)    SpO2 98%    BMI 23.82 kg/m    Physical Exam Vitals reviewed.  Constitutional:      General: He is not in acute distress.    Appearance: Normal appearance. He is normal weight. He is not ill-appearing, toxic-appearing or diaphoretic.  HENT:     Head: Normocephalic and atraumatic.  Eyes:     General: No scleral icterus.       Right eye: No discharge.        Left eye: No discharge.     Conjunctiva/sclera: Conjunctivae normal.  Cardiovascular:     Rate and Rhythm: Normal rate.  Pulmonary:     Effort: Pulmonary effort is normal. No respiratory distress.  Musculoskeletal:        General: Normal range of motion.     Cervical back: Normal range of motion.  Skin:    General: Skin is warm and dry.     Findings: Rash (rough, irritated skin across chest, upper back, neck, and bilateral shoulders) present.  Neurological:     Mental Status: He is alert and oriented to person, place, and time. Mental status is at baseline.  Psychiatric:        Mood and Affect: Mood normal.        Behavior: Behavior normal.        Thought Content: Thought content normal.        Judgment: Judgment normal.

## 2021-08-30 NOTE — Chronic Care Management (AMB) (Signed)
Chronic Care Management   CCM RN Visit Note  08/30/2021 Name: [name] [name]. MRN: [id number] DOB: July 14, 1946  Subjective: [name] Blood. is a 75 y.o. year old male who is a primary care patient of Gwenlyn Fudge, FNP. The care management team was consulted for assistance with disease management and care coordination needs.    Engaged with patient by telephone for follow up visit in response to provider referral for case management and/or care coordination services.   Consent to Services:  The patient was given information about Chronic Care Management services, agreed to services, and gave verbal consent prior to initiation of services.  Please see initial visit note for detailed documentation.   Patient agreed to services and verbal consent obtained.   Assessment: Review of patient past medical history, allergies, medications, health status, including review of consultants reports, laboratory and other test data, was performed as part of comprehensive evaluation and provision of chronic care management services.   SDOH (Social Determinants of Health) assessments and interventions performed:    CCM Care Plan  Allergies  Allergen Reactions   Lipitor [Atorvastatin] Other (See Comments)    "Makes pt feel funky"    Outpatient Encounter Medications as of 08/30/2021  Medication Sig   aspirin 81 MG chewable tablet Chew 81 mg by mouth daily.   bisoprolol (ZEBETA) 5 MG tablet Take 0.5 tablets (2.5 mg total) by mouth daily.   cholecalciferol (VITAMIN D3) 25 MCG (1000 UNIT) tablet Take 1,000 Units by mouth daily.    nitroGLYCERIN (NITROSTAT) 0.4 MG SL tablet Place 1 tablet (0.4 mg total) under the tongue every 5 (five) minutes as needed for chest pain.   rosuvastatin (CRESTOR) 40 MG tablet Take 1 tablet (40 mg total) by mouth daily.   sacubitril-valsartan (ENTRESTO) 24-26 MG Take 1 tablet by mouth 2 (two) times daily.   tadalafil (CIALIS) 10 MG tablet Take 1-2 tablets (10-20 mg  total) by mouth daily as needed.   ticagrelor (BRILINTA) 90 MG TABS tablet Take 1 tablet (90 mg total) by mouth 2 (two) times daily.   No facility-administered encounter medications on file as of 08/30/2021.    Patient Active Problem List   Diagnosis Date Noted   Acute combined systolic and diastolic CHF, NYHA class 2 (HCC) 07/22/2021   Status post coronary artery stent placement    Chronic systolic heart failure (HCC)    NSTEMI (non-ST elevated myocardial infarction) (HCC) 07/13/2021   Controlled substance agreement signed 01/07/2021   CAD (coronary artery disease) 11/16/2018   History of non-ST elevation myocardial infarction (NSTEMI)    Spondylolisthesis at L4-L5 level 06/08/2017   Coronary artery disease involving native coronary artery of native heart without angina pectoris 05/08/2017   Mixed hyperlipidemia 05/08/2017   Erectile dysfunction 03/29/2013    Conditions to be addressed/monitored:CHF, CAD, and HLD  Care Plan : [facility] Care Plan  Updates made by Gwenith Daily, RN since 08/30/2021 12:00 AM     Problem: Chronic Disease Management Needs   Priority: High     Goal: Patient will Work with RN Care Manager to Develop a Plan of Care Regarding Care Management and Care Coordination Associated with CAD, MIs, CHF, and HLD   Start Date: 08/30/2021  Expected End Date: 08/30/2022  This Visit's Progress: On track  Priority: High  Note:   Current Barriers:  Chronic Disease Management support and education needs related to CHF, CAD, and HLD Dermatitis associated with medication use  RNCM Clinical Goal(s):  Patient will  take all medications exactly as prescribed and will call provider for medication related questions as evidenced by chart documentation and patient verbalization    continue to work with RN Care Manager and/or Social Worker to address care management and care coordination needs related to CHF, CAD, and HLD as evidenced by adherence to CM Team Scheduled  appointments     through collaboration with RN Care manager, provider, and care team.   Interventions: 1:1 collaboration with primary care provider regarding development and update of comprehensive plan of care as evidenced by provider attestation and co-signature Inter-disciplinary care team collaboration (see longitudinal plan of care) Evaluation of current treatment plan related to  self management and patient's adherence to plan as established by provider   CAD  (Status: New goal.) Long Term Goal  Assessed understanding of CAD diagnosis Medications reviewed including medications utilized in CAD treatment plan Provided education on importance of blood pressure control in management of CAD; Provided education on Importance of limiting foods high in cholesterol; Counseled on importance of regular laboratory monitoring as prescribed; Counseled on the importance of exercise goals with target of 150 minutes per week Reviewed Importance of taking all medications as prescribed Reviewed Importance of attending all scheduled provider appointments Advised to report any changes in symptoms or exercise tolerance Advised patient to discuss new onset of rash since starting Brilinta with provider; Assessed social determinant of health barriers;  Reviewed and discussed office and hospital notes associated with MIs Discussed patient's ability to tell that he is about to have a heart attack Encouraged to continue monitoring for s/s of MI and to call 911 Discussed hyperkalemia associated with Sherryll Burger use Discussed low potassium diet and patient's displeasure with not being able to eat staple foods like potatoes and beans Reviewed and discussed recent potassium levels. Recommended to repeat within the next couple of weeks to see if it's trending up or down   Rash:  (Status: New goal.) Short Term Goal  Discussed rash development since starting Brilinta Started as a small patch on his shoulder and has  spread to both shoulders, down upper part of back , and upper part of chest. Red, pruritic, and bumpy. Not hives. Collaborated with front office staff to schedule an office visit for today at 4:05 with PCP Provided verbal education home management of symptoms.  Recommended cool showers and to avoid fragrant soaps, detergents, etc Use a fragrance free moisturizer like CeraVe Recommended OTC claritin or zyrtec but patient prefers to not take OTC meds Encouraged to reach out to PCP with any new or worsening symptoms  Patient Goals/Self-Care Activities: Take medications as prescribed   Attend all scheduled provider appointments Perform all self care activities independently  Perform IADL's (shopping, preparing meals, housekeeping, managing finances) independently Call provider office for new concerns or questions  - call doctor with any symptoms you believe are related to your medicine - call doctor when you experience any new symptoms Limit intake of high potassium foods Rck potassium level within the next few weeks to see if it's trending up or down Call PCP or seek medical attention for any new or worsening symptoms  Use fragrance free moisturizer like CeraVe Take cool showers and use fragrance free detergents and soaps Call RN Care Manager as needed [phone]   Plan:Telephone follow up appointment with care management team member scheduled for:  09/04/21 with RNCM The patient has been provided with contact information for the care management team and has been advised to call with any health  related questions or concerns.   Demetrios Loll, BSN, RN-BC Embedded Chronic Care Manager [facility] Care Management Direct Dial: [phone]

## 2021-08-30 NOTE — Telephone Encounter (Signed)
Called patient about his message. Patient stated he received a mychart message that told him to stop Brilinta and start Plavix. Did not see anything in patient's chart and no orders. Patient has run out of Brilinta as of last night. Patient is at pharmacy to pick up Brilinta and did not want to pick it up if he was going to be switching. Informed patient that he needs to go ahead and pick up the Brilinta since the office is closed now and he would have to have a doctors order for a new prescription to be sent in for Plavix. And patient's pharmacy closes at 6:00 pm. Informed patient that a message would be sent to Dr. Elease Hashimoto and see if he wants him to switch to Plavix after he finishes up with the new prescription of Brilinta.

## 2021-08-30 NOTE — Telephone Encounter (Signed)
Pt c/o medication issue:  1. Name of Medication:  ticagrelor (BRILINTA) 90 MG TABS tablet  2. How are you currently taking this medication (dosage and times per day)?   3. Are you having a reaction (difficulty breathing--STAT)?   4. What is your medication issue?   Patient states today he received a notification from our office on MyChart advising him to discontinue Brilinta and to start on Plavix .75 MG. He would like to confirm whether or not this is true.

## 2021-09-02 ENCOUNTER — Encounter: Payer: Self-pay | Admitting: Family Medicine

## 2021-09-02 NOTE — Telephone Encounter (Signed)
I called the patient on Sunday afternooon ( when I finally was able to get to my inbasket on a busy call weekend)   He did fill his Brilinta and has continued to take it.  He is having shortness of breath after taking the Brilinta - helped if he drinks coffee or tea     He is having a rash that sounds like a drug rash since he left the hospital 2 months ago.  This could be an allergy to Brilinta     I will not be back in the office for > 2 weeks .    I would like for him to come in to our medication management clinic / Pharm D team and see what they think of the rash  We can transition him to plavix.  Would give a Plavix 300 mg loading dose to transition and then plavix 75 mg a day if the rash is c/w a drug rash .     PN     Called patient today and made him an appointment with PharmD tomorrow. Patient agreed to plan.

## 2021-09-03 ENCOUNTER — Ambulatory Visit (INDEPENDENT_AMBULATORY_CARE_PROVIDER_SITE_OTHER): Payer: Medicare Other | Admitting: Pharmacist

## 2021-09-03 ENCOUNTER — Other Ambulatory Visit: Payer: Self-pay

## 2021-09-03 DIAGNOSIS — L270 Generalized skin eruption due to drugs and medicaments taken internally: Secondary | ICD-10-CM | POA: Insufficient documentation

## 2021-09-03 MED ORDER — CLOPIDOGREL BISULFATE 75 MG PO TABS: ORAL_TABLET | ORAL | 5 refills | Status: DC

## 2021-09-03 NOTE — Progress Notes (Signed)
Patient ID: [name] [name].                 DOB: 05/08/1946                      MRN: [id number]     HPI: [name] [name]. is a 75 y.o. male referred by Dr. Elease Hashimoto to PharmD clinic due to ? drug-induced rash. He was started on Entresto, Brilinta, rosuvastatin, and carvedilol the month prior after NSTEMI 06/2021 with stenting to distal RCA. Also has PMH of CAD s/p stenting in 2002, HLD, and HFrEF with EF of 25% 06/2021. Reported SOB with Brilinta at his visit with Dr Elease Hashimoto on 11/7, was drinking coffee prior to his doses to help. Pt called in to clinic 11/29 reporting rash on his shoulders, back, neck, and collarbones along with frequent urination and fecal incontinence. His carvedilol was changed to bisoprolol to see if it would help improve diarrhea and rash. He saw his PCP on 12/16 and had stopped his carvedilol with improvement in diarrhea and fecal incontinence, however had not started on the bisoprolol yet and was advised to do so. He still reported rash, was advised to use lotion, triamcinolone, and famotidine.   Pt presents today for follow up. Reports before his most recent MI, he had taken rosuvastatin and aspirin and tolerated them just fine. The main new medications were his carvedilol, Entresto, and Brilinta. GI symptoms resolved after changing from carvedilol to bisoprolol which he's now been on for the past 3-4 days. BP on the lower end today but still ok, pt denies dizziness, headache, and LE edema. Just picked up a 30 day supply of Brilinta last Friday that he wishes to use up before changing to Plavix since the Brilinta was expensive. Rash today appears mild and is located on his upper chest, upper back/neck, and shoulders. He has been using the triamcinolone cream his PCP prescribed which has been helping quite a bit with the itching and with some of the redness. Rash appeared 3 weeks after starting new meds and was patchy, itchy, bumpy, and red. Overall does not like taking  medications unless he needs them. Pt inquires about taking BP medication since his BP is not high. Discussed that his EF was reduced after his MI and that bisoprolol and Entresto will help improve his EF.  New cardiac meds: bisoprolol 2.5mg  daily (already changed from carvedilol), Entresto 24-26mg  BID, Brilinta 90mg  BID, rosuvastatin 40mg  daily  Intolerances: carvedilol - diarrhea and fecal incontinence  Family History: Mother with DM and heart disease, father with heart disease.  Social History: 2 beers daily, denies drug and tobacco use.  Wt Readings from Last 3 Encounters:  08/30/21 147 lb 9.6 oz (67 kg)  07/25/21 143 lb 3.2 oz (65 kg)  07/22/21 141 lb (64 kg)   BP Readings from Last 3 Encounters:  08/30/21 128/67  07/25/21 116/69  07/22/21 108/60   Pulse Readings from Last 3 Encounters:  08/30/21 (!) 57  07/25/21 75  07/22/21 88    Renal function: Estimated Creatinine Clearance: 71.1 mL/min (by C-G formula based on SCr of 0.81 mg/dL).  Past Medical History:  Diagnosis Date   Coronary artery disease    Erectile dysfunction    History of hiatal hernia    a lone time ago   Hyperlipidemia    [place] fracture [facility]) 03/22/2020   Myocardial infarction (HCC)    x 2   Polio  Current Outpatient Medications on File Prior to Visit  Medication Sig Dispense Refill   aspirin 81 MG chewable tablet Chew 81 mg by mouth daily.     bisoprolol (ZEBETA) 5 MG tablet Take 0.5 tablets (2.5 mg total) by mouth daily. 45 tablet 3   cholecalciferol (VITAMIN D3) 25 MCG (1000 UNIT) tablet Take 1,000 Units by mouth daily.      famotidine (PEPCID) 20 MG tablet Take 1 tablet (20 mg total) by mouth 2 (two) times daily. 60 tablet 2   HYDROcodone-acetaminophen (NORCO) 7.5-325 MG tablet Take 1 tablet by mouth daily as needed.     nitroGLYCERIN (NITROSTAT) 0.4 MG SL tablet Place 1 tablet (0.4 mg total) under the tongue every 5 (five) minutes as needed for chest pain. 25 tablet 2   rosuvastatin  (CRESTOR) 40 MG tablet Take 1 tablet (40 mg total) by mouth daily. 30 tablet 3   sacubitril-valsartan (ENTRESTO) 24-26 MG Take 1 tablet by mouth 2 (two) times daily. 60 tablet 3   tadalafil (CIALIS) 10 MG tablet Take 1-2 tablets (10-20 mg total) by mouth daily as needed. 10 tablet 2   ticagrelor (BRILINTA) 90 MG TABS tablet Take 1 tablet (90 mg total) by mouth 2 (two) times daily. 60 tablet 3   triamcinolone cream (KENALOG) 0.1 % Apply 1 application topically 2 (two) times daily. 80 g 1   No current facility-administered medications on file prior to visit.    Allergies  Allergen Reactions   Lipitor [Atorvastatin] Other (See Comments)    "Makes pt feel funky"     Assessment/Plan:  1. Drug rash - improving with triamcinolone cream. Pt prefers to finish using his current supply of Brilinta since it was expensive, has about 3-3.5 weeks left. Once he uses this up, he will take a loading dose of clopidogrel 300mg  24 hours after his last dose of Brilinta, then decrease to 75mg  once daily. He will monitor for improvement in his rash.   Payten Hobin E. Trajan Grove, PharmD, BCACP, CPP [facility] [address] [place], [address] [place] Phone: [phone]; Fax: [phone] 09/03/2021 12:13 PM

## 2021-09-03 NOTE — Patient Instructions (Signed)
You can finish using up your Brilinta. Once it's gone, we'll change you to Plavix (clopidogrel). Take 4 tablets (300mg ) of Plavix 24 hours after your last dose of Brilinta, then start taking 1 tablet (75mg ) once daily  Monitor to see if your rash improves

## 2021-09-04 ENCOUNTER — Ambulatory Visit: Payer: Medicare Other | Admitting: *Deleted

## 2021-09-04 DIAGNOSIS — I5042 Chronic combined systolic (congestive) and diastolic (congestive) heart failure: Secondary | ICD-10-CM

## 2021-09-04 DIAGNOSIS — L270 Generalized skin eruption due to drugs and medicaments taken internally: Secondary | ICD-10-CM

## 2021-09-04 DIAGNOSIS — I25119 Atherosclerotic heart disease of native coronary artery with unspecified angina pectoris: Secondary | ICD-10-CM

## 2021-09-04 NOTE — Patient Instructions (Signed)
Visit Information  Patient Goals/Self-Care Activities: Take medications as prescribed   Attend all scheduled provider appointments Perform all self care activities independently  Perform IADL's (shopping, preparing meals, housekeeping, managing finances) independently Call provider office for new concerns or questions  - call doctor with any symptoms you believe are related to your medicine - call doctor when you experience any new symptoms Limit intake of high potassium foods Rck potassium level within the next few weeks to see if it's trending up or down Call PCP or seek medical attention for any new or worsening symptoms  Use fragrance free moisturizer like CeraVe Take cool showers and use fragrance free detergents and soaps Call RN Care Manager as needed [phone]  Patient verbalizes understanding of instructions provided today and agrees to view in MyChart.   Plan:Telephone follow up appointment with care management team member scheduled for:  10/09/21 with RNCM The patient has been provided with contact information for the care management team and has been advised to call with any health related questions or concerns.   Demetrios Loll, BSN, RN-BC Embedded Chronic Care Manager [facility] Care Management Direct Dial: [phone]

## 2021-09-04 NOTE — Chronic Care Management (AMB) (Signed)
Chronic Care Management   CCM RN Visit Note  09/04/2021 Name: [name] [name]. MRN: [id number] DOB: 09-08-1946  Subjective: [name] [name]. is a 75 y.o. year old male who is a primary care patient of Gwenlyn Fudge, FNP. The care management team was consulted for assistance with disease management and care coordination needs.    Engaged with patient by telephone for follow up visit in response to provider referral for case management and/or care coordination services.   Consent to Services:  The patient was given information about Chronic Care Management services, agreed to services, and gave verbal consent prior to initiation of services.  Please see initial visit note for detailed documentation.   Patient agreed to services and verbal consent obtained.   Assessment: Review of patient past medical history, allergies, medications, health status, including review of consultants reports, laboratory and other test data, was performed as part of comprehensive evaluation and provision of chronic care management services.   SDOH (Social Determinants of Health) assessments and interventions performed:    CCM Care Plan  Allergies  Allergen Reactions   Carvedilol     diarrhea   Lipitor [Atorvastatin] Other (See Comments)    "Makes pt feel funky"    Outpatient Encounter Medications as of 09/04/2021  Medication Sig   aspirin 81 MG chewable tablet Chew 81 mg by mouth daily.   bisoprolol (ZEBETA) 5 MG tablet Take 0.5 tablets (2.5 mg total) by mouth daily.   cholecalciferol (VITAMIN D3) 25 MCG (1000 UNIT) tablet Take 1,000 Units by mouth daily.    clopidogrel (PLAVIX) 75 MG tablet Take 4 tablets (300mg ) by mouth once (24 hours after your last Brilinta dose), then start taking 1 tablet (75mg ) once daily   famotidine (PEPCID) 20 MG tablet Take 1 tablet (20 mg total) by mouth 2 (two) times daily.   HYDROcodone-acetaminophen (NORCO) 7.5-325 MG tablet Take 1 tablet by mouth daily as  needed.   nitroGLYCERIN (NITROSTAT) 0.4 MG SL tablet Place 1 tablet (0.4 mg total) under the tongue every 5 (five) minutes as needed for chest pain.   rosuvastatin (CRESTOR) 40 MG tablet Take 1 tablet (40 mg total) by mouth daily.   sacubitril-valsartan (ENTRESTO) 24-26 MG Take 1 tablet by mouth 2 (two) times daily.   tadalafil (CIALIS) 10 MG tablet Take 1-2 tablets (10-20 mg total) by mouth daily as needed.   triamcinolone cream (KENALOG) 0.1 % Apply 1 application topically 2 (two) times daily.   No facility-administered encounter medications on file as of 09/04/2021.    Patient Active Problem List   Diagnosis Date Noted   Drug rash 09/03/2021   Acute combined systolic and diastolic CHF, NYHA class 2 (HCC) 07/22/2021   Status post coronary artery stent placement    Chronic systolic heart failure (HCC)    NSTEMI (non-ST elevated myocardial infarction) (HCC) 07/13/2021   Controlled substance agreement signed 01/07/2021   CAD (coronary artery disease) 11/16/2018   History of non-ST elevation myocardial infarction (NSTEMI)    Spondylolisthesis at L4-L5 level 06/08/2017   Coronary artery disease involving native coronary artery of native heart without angina pectoris 05/08/2017   Mixed hyperlipidemia 05/08/2017   Erectile dysfunction 03/29/2013    Conditions to be addressed/monitored:CHF and CAD  Care Plan : [facility] Care Plan  Updates made by 03/31/2013, RN since 09/04/2021 12:00 AM     Problem: Chronic Disease Management Needs   Priority: High     Long-Range Goal: Patient will Work with RN Care  Manager to Develop a Plan of Care Regarding Care Management and Care Coordination Associated with CAD, MIs, CHF, and HLD   Start Date: 08/30/2021  Expected End Date: 08/30/2022  This Visit's Progress: On track  Recent Progress: On track  Priority: High  Note:   Current Barriers:  Chronic Disease Management support and education needs related to CHF, CAD, and HLD Dermatitis  associated with medication use  RNCM Clinical Goal(s):  Patient will take all medications exactly as prescribed and will call provider for medication related questions as evidenced by chart documentation and patient verbalization    continue to work with RN Care Manager and/or Social Worker to address care management and care coordination needs related to CHF, CAD, and HLD as evidenced by adherence to CM Team Scheduled appointments     through collaboration with Medical illustrator, provider, and care team.   Interventions: 1:1 collaboration with primary care provider regarding development and update of comprehensive plan of care as evidenced by provider attestation and co-signature Inter-disciplinary care team collaboration (see longitudinal plan of care) Evaluation of current treatment plan related to  self management and patient's adherence to plan as established by provider   CAD  (Status: Goal on Track (progressing): YES.) Long Term Goal  Assessed understanding of CAD diagnosis Medications reviewed including medications utilized in CAD treatment plan Reviewed and discussed recent telephone note from cardiologist and plan to switch from Brilinta to Plavix once he's finished his current bottle of Brilinita.  Discussed cost savings with Plavix vs Brilinta  Provided education on importance of [name] pressure control in management of CAD; Provided education on Importance of limiting foods high in cholesterol; Counseled on importance of regular laboratory monitoring as prescribed; Counseled on the importance of exercise goals with target of 150 minutes per week Reviewed Importance of taking all medications as prescribed Reviewed Importance of attending all scheduled provider appointments Advised to report any changes in symptoms or exercise tolerance Advised patient to discuss new onset of rash since starting Brilinta with provider; Assessed social determinant of health barriers;  Reviewed and  discussed office and hospital notes associated with MIs Previously discussed patient's ability to tell that he is about to have a heart attack Encouraged to continue monitoring for s/s of MI and to call 911 Discussed hyperkalemia associated with Sherryll Burger use Discussed low potassium diet and patient's displeasure with not being able to eat staple foods like potatoes and beans. He plans to start slowly reintroducing them into his diet. He did discuss this with PCP. Reviewed and discussed recent potassium levels. Recommended to repeat within the next couple of weeks to see if it's trending up or down. PCP placed future order for BMP in 2 weeks to reassess K+ level.   Rash:  (Status: Goal on Track (progressing): YES.) Short Term Goal  Previously discussed rash development since starting Brilinta and collaborated with [facility] front office staff to schedule a visit with PCP Started as a small patch on his shoulder and has spread to both shoulders, down upper part of back , and upper part of chest. Red, pruritic, and bumpy. Not hives. Provided verbal education home management of symptoms.  Recommended cool showers and to avoid fragrant soaps, detergents, etc Use a fragrance free moisturizer like CeraVe Continue pepcid and steroid cream prescribed by PCP Verified that patient does not have any difficulty breathing, swelling, etc Encouraged to reach out to PCP with any new or worsening symptoms or to seek emergency medical attention for life threatening symptoms  Patient Goals/Self-Care Activities: Take medications as prescribed   Attend all scheduled provider appointments Perform all self care activities independently  Perform IADL's (shopping, preparing meals, housekeeping, managing finances) independently Call provider office for new concerns or questions  - call doctor with any symptoms you believe are related to your medicine - call doctor when you experience any new symptoms Limit intake of high  potassium foods Rck potassium level within the next few weeks to see if it's trending up or down Call PCP or seek medical attention for any new or worsening symptoms  Use fragrance free moisturizer like CeraVe Take cool showers and use fragrance free detergents and soaps Call RN Care Manager as needed [phone]  Plan:Telephone follow up appointment with care management team member scheduled for:  10/09/21 with RNCM The patient has been provided with contact information for the care management team and has been advised to call with any health related questions or concerns.   Demetrios Loll, BSN, RN-BC Embedded Chronic Care Manager [facility] Care Management Direct Dial: [phone]

## 2021-09-14 DIAGNOSIS — I5042 Chronic combined systolic (congestive) and diastolic (congestive) heart failure: Secondary | ICD-10-CM

## 2021-09-14 DIAGNOSIS — E782 Mixed hyperlipidemia: Secondary | ICD-10-CM

## 2021-09-14 DIAGNOSIS — I252 Old myocardial infarction: Secondary | ICD-10-CM

## 2021-09-14 DIAGNOSIS — I25119 Atherosclerotic heart disease of native coronary artery with unspecified angina pectoris: Secondary | ICD-10-CM

## 2021-09-25 ENCOUNTER — Other Ambulatory Visit: Payer: Self-pay | Admitting: Family Medicine

## 2021-09-25 DIAGNOSIS — N529 Male erectile dysfunction, unspecified: Secondary | ICD-10-CM

## 2021-10-01 ENCOUNTER — Ambulatory Visit (INDEPENDENT_AMBULATORY_CARE_PROVIDER_SITE_OTHER): Payer: Medicare Other | Admitting: Licensed Clinical Social Worker

## 2021-10-01 ENCOUNTER — Other Ambulatory Visit: Payer: Self-pay | Admitting: Pharmacist

## 2021-10-01 DIAGNOSIS — I252 Old myocardial infarction: Secondary | ICD-10-CM

## 2021-10-01 DIAGNOSIS — I25119 Atherosclerotic heart disease of native coronary artery with unspecified angina pectoris: Secondary | ICD-10-CM

## 2021-10-01 DIAGNOSIS — E782 Mixed hyperlipidemia: Secondary | ICD-10-CM

## 2021-10-01 MED ORDER — CLOPIDOGREL BISULFATE 75 MG PO TABS: 75.0000 mg | ORAL_TABLET | Freq: Every day | ORAL | 5 refills | Status: DC
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
## 2021-10-01 NOTE — Patient Instructions (Addendum)
Visit Information  Patient Goals:  Protect My Health (Patient). Manage dental needs of client. Complete ADLs daily as needed  Timeframe:  Short-Term Goal Priority:  Medium Progress: On Track Start Date:    10/01/21                       Expected End Date:   12/30/21               Follow Up Date 11/26/21 at 1:00 PM   Protect My Health (Patient) Manage dental needs of client . Complete ADLs daily as needed   Why is this important?   Screening tests can find diseases early when they are easier to treat.  Your doctor or nurse will talk with you about which tests are important for you.  Getting shots for common diseases like the flu and shingles will help prevent them.     Patient Strengths: Drives to needed appointments Takes medications as prescribed Researches dental recourses in the community Eats meals regularly Advocates for self and his needs  Patient Deficits Dental needs Difficulty chewing and swallowing Some pain issues  Patient Goals:  Attend scheduled medical appointments Talked with RNCM or LCSW as needed for support Take medications as prescribed Continue to research dental resources in the area for possible help with dental needs of client -  Follow Up Plan: LCSW to call client on 11/26/21 at 1:00 PM to assess client needs   [name] [name].[name] [name] MSW, LCSW Licensed Clinical Social Worker [facility] Care Management [phone]

## 2021-10-03 ENCOUNTER — Other Ambulatory Visit: Payer: Medicare Other

## 2021-10-03 DIAGNOSIS — E875 Hyperkalemia: Secondary | ICD-10-CM | POA: Diagnosis not present

## 2021-10-03 LAB — BMP8+EGFR
BUN/Creatinine Ratio: 18 (ref 10–24)
BUN: 15 mg/dL (ref 8–27)
CO2: 23 mmol/L (ref 20–29)
Calcium: 9.1 mg/dL (ref 8.6–10.2)
Chloride: 105 mmol/L (ref 96–106)
Creatinine, Ser: 0.84 mg/dL (ref 0.76–1.27)
Glucose: 91 mg/dL (ref 70–99)
Potassium: 4.8 mmol/L (ref 3.5–5.2)
Sodium: 140 mmol/L (ref 134–144)
eGFR: 91 mL/min/{1.73_m2} (ref >59)

## 2021-10-09 ENCOUNTER — Telehealth: Payer: Medicare Other

## 2021-10-11 ENCOUNTER — Ambulatory Visit: Payer: Self-pay | Admitting: *Deleted

## 2021-10-11 DIAGNOSIS — I5042 Chronic combined systolic (congestive) and diastolic (congestive) heart failure: Secondary | ICD-10-CM

## 2021-10-11 DIAGNOSIS — S02412D LeFort II fracture, subsequent encounter for fracture with routine healing: Secondary | ICD-10-CM

## 2021-10-11 DIAGNOSIS — I25119 Atherosclerotic heart disease of native coronary artery with unspecified angina pectoris: Secondary | ICD-10-CM

## 2021-10-11 NOTE — Patient Instructions (Signed)
Visit Information  Patient Goals/Self-Care Activities: Take medications as prescribed   Attend all scheduled provider appointments Perform all self care activities independently  Perform IADL's (shopping, preparing meals, housekeeping, managing finances) independently Call provider office for new concerns or questions  - call doctor with any symptoms you believe are related to your medicine - call doctor when you experience any new symptoms Limit intake of high potassium foods Call PCP or seek medical attention for any new or worsening symptoms  Start taking Pepcid again and use steroid cream on rash Take cool showers and use fragrance free detergents and soaps Call RN Care Manager as needed [phone] Keep appointment with PCP on 10/15/21  Patient verbalizes understanding of instructions and care plan provided today and agrees to view in [facility]. Active MyChart status confirmed with patient.    Plan:Telephone follow up appointment with care management team member scheduled for:  10/17/21 with [facility] The patient has been provided with contact information for the care management team and has been advised to call with any health related questions or concerns.   Chong Sicilian, BSN, RN-BC Embedded Chronic Care Manager [facility] / [place] Management Direct Dial: [phone]

## 2021-10-11 NOTE — Chronic Care Management (AMB) (Signed)
Chronic Care Management   CCM RN Visit Note  10/11/2021 Name: [name] [name]. MRN: [id number] DOB: Jun 22, 1946  Subjective: [name] [name]. is a 76 y.o. year old male who is a primary care patient of Gwenlyn Fudge, FNP. The care management team was consulted for assistance with disease management and care coordination needs.    Engaged with patient by telephone for follow up visit in response to provider referral for case management and/or care coordination services.   Consent to Services:  The patient was given information about Chronic Care Management services, agreed to services, and gave verbal consent prior to initiation of services.  Please see initial visit note for detailed documentation.   Patient agreed to services and verbal consent obtained.   Assessment: Review of patient past medical history, allergies, medications, health status, including review of consultants reports, laboratory and other test data, was performed as part of comprehensive evaluation and provision of chronic care management services.   SDOH (Social Determinants of Health) assessments and interventions performed:    CCM Care Plan  Allergies  Allergen Reactions   Carvedilol     diarrhea   Lipitor [Atorvastatin] Other (See Comments)    "Makes pt feel funky"    Outpatient Encounter Medications as of 10/11/2021  Medication Sig   tadalafil (CIALIS) 10 MG tablet TAKE 1 TO 2 TABLETS AS DIRECTED AS NEEDED   aspirin 81 MG chewable tablet Chew 81 mg by mouth daily.   bisoprolol (ZEBETA) 5 MG tablet Take 0.5 tablets (2.5 mg total) by mouth daily.   cholecalciferol (VITAMIN D3) 25 MCG (1000 UNIT) tablet Take 1,000 Units by mouth daily.    clopidogrel (PLAVIX) 75 MG tablet Take 1 tablet (75 mg total) by mouth daily.   famotidine (PEPCID) 20 MG tablet Take 1 tablet (20 mg total) by mouth 2 (two) times daily.   HYDROcodone-acetaminophen (NORCO) 7.5-325 MG tablet Take 1 tablet by mouth daily as needed.    nitroGLYCERIN (NITROSTAT) 0.4 MG SL tablet Place 1 tablet (0.4 mg total) under the tongue every 5 (five) minutes as needed for chest pain.   rosuvastatin (CRESTOR) 40 MG tablet Take 1 tablet (40 mg total) by mouth daily.   sacubitril-valsartan (ENTRESTO) 24-26 MG Take 1 tablet by mouth 2 (two) times daily.   triamcinolone cream (KENALOG) 0.1 % Apply 1 application topically 2 (two) times daily.   No facility-administered encounter medications on file as of 10/11/2021.    Patient Active Problem List   Diagnosis Date Noted   Drug rash 09/03/2021   Acute combined systolic and diastolic CHF, NYHA class 2 (HCC) 07/22/2021   Status post coronary artery stent placement    Chronic systolic heart failure (HCC)    NSTEMI (non-ST elevated myocardial infarction) (HCC) 07/13/2021   Controlled substance agreement signed 01/07/2021   CAD (coronary artery disease) 11/16/2018   History of non-ST elevation myocardial infarction (NSTEMI)    Spondylolisthesis at L4-L5 level 06/08/2017   Coronary artery disease involving native coronary artery of native heart without angina pectoris 05/08/2017   Mixed hyperlipidemia 05/08/2017   Erectile dysfunction 03/29/2013    Conditions to be addressed/monitored:CHF, CAD, and poor oral health, rectal/GI bleeding  Care Plan : [name] [name] Care Plan  Updates made by Gwenith Daily, RN since 10/11/2021 12:00 AM     Problem: Chronic Disease Management Needs   Priority: High     Long-Range Goal: Patient will Work with RN Care Manager to Develop a Plan of Care Regarding Care Management  and Care Coordination Associated with CAD, MIs, CHF, and HLD   Start Date: 08/30/2021  Expected End Date: 08/30/2022  This Visit's Progress: On track  Recent Progress: On track  Priority: High  Note:   Current Barriers:  Chronic Disease Management support and education needs related to CHF, CAD, and HLD Dermatitis associated with medication use  RNCM Clinical Goal(s):  Patient will take  all medications exactly as prescribed and will call provider for medication related questions as evidenced by chart documentation and patient verbalization    continue to work with RN Care Manager and/or Social Worker to address care management and care coordination needs related to CHF, CAD, and HLD as evidenced by adherence to CM Team Scheduled appointments     through collaboration with Medical illustratorN Care manager, provider, and care team.   Interventions: 1:1 collaboration with primary care provider regarding development and update of comprehensive plan of care as evidenced by provider attestation and co-signature Inter-disciplinary care team collaboration (see longitudinal plan of care) Evaluation of current treatment plan related to  self management and patient's adherence to plan as established by provider   CAD:  (Status: Goal on Track (progressing): YES.) Long Term Goal  Assessed understanding of CAD diagnosis Medications reviewed including medications utilized in CAD treatment plan Discussed cost savings with Plavix vs Brilinta  Provided education on importance of [name] pressure control in management of CAD; Provided education on Importance of limiting foods high in cholesterol; Counseled on importance of regular laboratory monitoring as prescribed; Counseled on the importance of exercise goals with target of 150 minutes per week Reviewed Importance of taking all medications as prescribed Reviewed Importance of attending all scheduled provider appointments Advised to report any changes in symptoms or exercise tolerance Assessed social determinant of health barriers; Reviewed and discussed office and [name] notes associated with MIs Encouraged to continue monitoring for s/s of MI and to call 911 Discussed hyperkalemia associated with Sherryll BurgerEntresto use Discussed low potassium diet and patient's displeasure with not being able to eat staple foods like potatoes and beans. He has been slowly  reintroducing beans and potatoes.  Reviewed and discussed recent potassium levels were normal.  Recommended to follow-up with PCP and Cardio as planned   Rash:  (Status: Goal on track: NO.) Short Term Goal  Previously treated with steroid cream and pepcid for a rash that started as a small patch on his shoulder and has spread to both shoulders, down upper part of back , and upper part of chest. Red, pruritic, and bumpy.  Rash was though to be due to Brilinta and he was switched to Plavix.  Patient stopped taking Pepcid about 2 weeks ago Rash started to return on his shoulders last night Provided verbal education home management of symptoms.  Recommended cool showers and to avoid fragrant soaps, detergents, etc Use a fragrance free moisturizer like CeraVe restart pepcid and steroid cream prescribed by PCP Verified that patient does not have any difficulty breathing, swelling, etc Discussed upcoming appointment with PCP on 10/15/21 Collaborated with PCP and Margaretmary DysMegan Supple, RPH with [facility].   Rectal Bleeding:  (Status: New goal.) Short Term Goal  Discussed switch from Brilinta to Plavix a little over a month ago due to rash and cost of Brilinta Discussed onset of bright red rectal bleeding about 4 weeks ago. Per patient, it was a large amount and seeping out in between bowel movements. denies prior history of rectal/GI bleeding Patient stopped taking pepcid and bisoprolol about 3 weeks ago and  the bright red bleeding resolved 2 weeks ago. Likely unrelated and unsure of why he chose to stop those two meds Discussed onset of black/tarry stools about 3 weeks ago.  Patient denies decreased energy, fatigue, pallor, pain, palpitations, etc. Overall he feels well and has been doing his usual activities.  Collaborated with Margaretmary Dys, RPH with [name] [name] and PCP, [name], FNP Advised patient to keep f/u appt with PCP on 10/15/21 and to seek emergency medical attention for any new or  worsening symptoms   Oral Health:  (Status: New goal.) Short Term Goal  Discussed history of traumatic facial fractures and injury to jaw and teeth Discussed how this affects his ability to eat normally Previously collaborated with [employer] and International aid/development worker regarding dental care Discussed visits at [facility] and plan of treatment Discussed insurance denial of additional treatments, including dental implants. Patient has a letter of medical necessity from his oral/maxillofacial surgeon but appeals have been denied. He has talked with lawyers as well. He would like to pursue treatment but it's around $16,000   Patient Goals/Self-Care Activities: Take medications as prescribed   Attend all scheduled provider appointments Perform all self care activities independently  Perform IADL's (shopping, preparing meals, housekeeping, managing finances) independently Call provider office for new concerns or questions  - call doctor with any symptoms you believe are related to your medicine - call doctor when you experience any new symptoms Limit intake of high potassium foods Call PCP or seek medical attention for any new or worsening symptoms  Start taking Pepcid again and use steroid cream on rash Take cool showers and use fragrance free detergents and soaps Call RN Care Manager as needed [phone] Keep appointment with PCP on 10/15/21  Plan:Telephone follow up appointment with care management team member scheduled for:  10/17/21 with RNCM The patient has been provided with contact information for the care management team and has been advised to call with any health related questions or concerns.   [name] [name], BSN, RN-BC Embedded Chronic Care Manager [facility] Care Management Direct Dial: [phone]

## 2021-10-15 ENCOUNTER — Ambulatory Visit (INDEPENDENT_AMBULATORY_CARE_PROVIDER_SITE_OTHER): Payer: Medicare Other | Admitting: Family Medicine

## 2021-10-15 ENCOUNTER — Encounter: Payer: Self-pay | Admitting: Family Medicine

## 2021-10-15 VITALS — BP 128/76 | HR 87 | Temp 97.4°F | Ht 66.0 in | Wt 148.8 lb

## 2021-10-15 DIAGNOSIS — I252 Old myocardial infarction: Secondary | ICD-10-CM

## 2021-10-15 DIAGNOSIS — I25119 Atherosclerotic heart disease of native coronary artery with unspecified angina pectoris: Secondary | ICD-10-CM

## 2021-10-15 DIAGNOSIS — L309 Dermatitis, unspecified: Secondary | ICD-10-CM

## 2021-10-15 DIAGNOSIS — Z79899 Other long term (current) drug therapy: Secondary | ICD-10-CM

## 2021-10-15 DIAGNOSIS — K625 Hemorrhage of anus and rectum: Secondary | ICD-10-CM

## 2021-10-15 DIAGNOSIS — T887XXA Unspecified adverse effect of drug or medicament, initial encounter: Secondary | ICD-10-CM

## 2021-10-15 DIAGNOSIS — S02412D LeFort II fracture, subsequent encounter for fracture with routine healing: Secondary | ICD-10-CM | POA: Diagnosis not present

## 2021-10-15 DIAGNOSIS — K089 Disorder of teeth and supporting structures, unspecified: Secondary | ICD-10-CM | POA: Diagnosis not present

## 2021-10-15 DIAGNOSIS — I5022 Chronic systolic (congestive) heart failure: Secondary | ICD-10-CM

## 2021-10-15 DIAGNOSIS — I5042 Chronic combined systolic (congestive) and diastolic (congestive) heart failure: Secondary | ICD-10-CM

## 2021-10-15 DIAGNOSIS — E782 Mixed hyperlipidemia: Secondary | ICD-10-CM

## 2021-10-15 MED ORDER — HYDROCODONE-ACETAMINOPHEN 7.5-325 MG PO TABS: 1.0000 | ORAL_TABLET | Freq: Every day | ORAL | 0 refills | Status: DC | PRN

## 2021-10-15 MED ORDER — NITROGLYCERIN 0.4 MG SL SUBL: 0.4000 mg | SUBLINGUAL_TABLET | SUBLINGUAL | 2 refills | Status: AC | PRN

## 2021-10-15 NOTE — Progress Notes (Signed)
Assessment & Plan:  1-3. Closed [name] [name] fracture with routine healing, subsequent encounter/Teeth problem/Controlled substance agreement signed Well controlled on current regimen. Controlled substance agreement in place. Urine drug screen as expected. PDMP reviewed with no concerning findings.  - HYDROcodone-acetaminophen (NORCO) 7.5-325 MG tablet; Take 1 tablet by mouth daily as needed.  Dispense: 30 tablet; Refill: 0  4-7. Chronic systolic heart failure (HCC)/Coronary artery disease involving native coronary artery of native heart with angina pectoris (HCC)/History of non-ST elevation myocardial infarction (NSTEMI)/Medication side effects Discussed the importance of his medications with him and explained their role in his heart health despite him having a normal blood pressure. He understands but is still unwilling to tolerate side effects that decrease his quality of life. Suggested he discuss with cardiology the possibility of starting one at a time so that if he has a side effect, he knows which medication it is to. I feel this is better than him stopping everything and refusing to take any. He is agreeable to this plan. - nitroGLYCERIN (NITROSTAT) 0.4 MG SL tablet; Place 1 tablet (0.4 mg total) under the tongue every 5 (five) minutes as needed for chest pain.  Dispense: 10 tablet; Refill: 2  8. Dermatitis Continue triamcinolone cream since it is effective.  9. Rectal bleeding Resolving. Discussed with patient the was likely the blood thinner and if it should happen again he needs to go see a gastroenterologist.    Return in about 3 months (around 01/12/2022) for follow-up of chronic medication conditions.  Hendricks Limes, MSN, APRN, FNP-C [facility]  Subjective:    Patient ID: [name] Rusher., male    DOB: 04-28-1946, 76 y.o.   MRN: [id number]  Patient Care Team: [name], FNP as PCP - General (Family Medicine) Nahser, Wonda Cheng, MD as PCP -  Cardiology (Cardiology) Shea Evans Norva Riffle, LCSW as Social Worker (Licensed Clinical Social Worker) Ilean China, RN as Case Scientist, physiological Complaint:  Chief Complaint  Patient presents with   Medical Management of Chronic Issues   Rash    Bilateral shoulders x 2-3 weeks     HPI: [name] [name]. is a 76 y.o. male presenting on 10/15/2021 for Medical Management of Chronic Issues and Rash (Bilateral shoulders x 2-3 weeks )  Pain assessment: Cause of pain- [place] [name] fracture Pain location- Mouth Pain on scale of 1-10- 2-3/10 normally, 8-0/22 with eating certain foods, 3-3/61 with Norco Frequency- depends on what he is eating What increases pain- certain foods What makes pain better- avoidance of certain foods, Norco Effects on ADL- makes eating difficult Any change in general medical condition- No  Current opioids rx- Norco 7.5/10 PO QD PRN # meds rx- 30 Effectiveness of current meds- effective Adverse reactions from pain meds- none Morphine equivalent- 7.5 MME/day Last refilled on 08/09/2021 and before that 04/04/2021  Pill count performed-No Last drug screen - 04/04/2021 ( high risk q46m moderate risk q637mlow risk yearly ) Urine drug screen today- No Was the [place]- Yes  If yes were their any concerning findings? - No  Overdose risk: 90  Opioid Risk  04/07/2021  Alcohol 3  Illegal Drugs 3  Rx Drugs 4  Alcohol 0  Illegal Drugs 0  Rx Drugs 0  Age between 16-45 years  0  History of Preadolescent Sexual Abuse 0  Psychological Disease 0  Depression 0  Opioid Risk Tool Scoring 10  Opioid Risk Interpretation High Risk  *Son is  an addict and the one who broke his jaw*  Pain contract signed on: 01/02/2021  Chronic combined systolic and diastolic congestive heart failure (HCC)/History of non-ST elevation myocardial infarction (NSTEMI): patient has stopped taking bisoprolol, Plavix, and Entresto. States he has had too many side effects and he doesn't know  which one caused them, so he stopped everything that was new. He does not understand the purpose in the medications either since he doesn't have high blood pressure. He enjoys hunting on [employer] and states he hasn't been able to do the things he enjoys because of side effects from his medications. He has no intention of being miserable and unable to do things he enjoys just to take medication to prolong his life if he can't enjoy it.   New complaints: Patient reports a rash to bilateral shoulders that is red and itchy. He reports today it started 2-3 weeks ago but upon chart review told the chronic care management nurse on 10/11/2021 that it started "last night".  It was initially felt the rash was due to Brilinta, but he was switched off of this to Plavix. He reports today the triamcinolone cream stops the itching and the bumps are improving.   Patient also reported to the chronic care management nurse that he was having rectal bleeding that started 4 weeks prior. He reported large amounts of blood seeping out in between bowel movements. Today he states the blood was initially bright red, then dark in color, and now clearing up since he stopped all the medications.    Social history:  Relevant past medical, surgical, family and social history reviewed and updated as indicated. Interim medical history since our last visit reviewed.  Allergies and medications reviewed and updated.  DATA REVIEWED: CHART IN EPIC  ROS: Negative unless specifically indicated above in HPI.    Current Outpatient Medications:    aspirin 81 MG chewable tablet, Chew 81 mg by mouth daily., Disp: , Rfl:    cholecalciferol (VITAMIN D3) 25 MCG (1000 UNIT) tablet, Take 1,000 Units by mouth daily. , Disp: , Rfl:    HYDROcodone-acetaminophen (NORCO) 7.5-325 MG tablet, Take 1 tablet by mouth daily as needed., Disp: , Rfl:    nitroGLYCERIN (NITROSTAT) 0.4 MG SL tablet, Place 1 tablet (0.4 mg total) under the tongue every 5  (five) minutes as needed for chest pain., Disp: 25 tablet, Rfl: 2   rosuvastatin (CRESTOR) 40 MG tablet, Take 1 tablet (40 mg total) by mouth daily., Disp: 30 tablet, Rfl: 3   tadalafil (CIALIS) 10 MG tablet, TAKE 1 TO 2 TABLETS AS DIRECTED AS NEEDED, Disp: 10 tablet, Rfl: 2   triamcinolone cream (KENALOG) 0.1 %, Apply 1 application topically 2 (two) times daily., Disp: 80 g, Rfl: 1   bisoprolol (ZEBETA) 5 MG tablet, Take 0.5 tablets (2.5 mg total) by mouth daily. (Patient not taking: Reported on 10/15/2021), Disp: 45 tablet, Rfl: 3   clopidogrel (PLAVIX) 75 MG tablet, Take 1 tablet (75 mg total) by mouth daily. (Patient not taking: Reported on 10/15/2021), Disp: 30 tablet, Rfl: 5   famotidine (PEPCID) 20 MG tablet, Take 1 tablet (20 mg total) by mouth 2 (two) times daily. (Patient not taking: Reported on 10/15/2021), Disp: 60 tablet, Rfl: 2   sacubitril-valsartan (ENTRESTO) 24-26 MG, Take 1 tablet by mouth 2 (two) times daily. (Patient not taking: Reported on 10/15/2021), Disp: 60 tablet, Rfl: 3   Allergies  Allergen Reactions   Carvedilol     diarrhea   Lipitor [Atorvastatin] Other (  See Comments)    "Makes pt feel funky"   Past Medical History:  Diagnosis Date   Coronary artery disease    Erectile dysfunction    History of hiatal hernia    a lone time ago   Hyperlipidemia    [place] fracture [facility]) 03/22/2020   Myocardial infarction ([place])    x 2   Polio     Past Surgical History:  Procedure Laterality Date   APPENDECTOMY     CARDIAC CATHETERIZATION     has two stents placed   CERVICAL SPINE SURGERY     c3-4, 4-5,  5-6, 8 screws and plates   CORONARY STENT INTERVENTION N/A 07/15/2021   Procedure: CORONARY STENT INTERVENTION;  Surgeon: Lorretta Harp, MD;  Location: [place] [facility];  Service: Cardiovascular;  Laterality: N/A;   CORONARY STENT PLACEMENT     forein body removal     metal sharp removed from right leg   LEFT HEART CATH AND CORONARY ANGIOGRAPHY N/A 11/15/2018    Procedure: LEFT HEART CATH AND CORONARY ANGIOGRAPHY;  Surgeon: Nelva Bush, MD;  Location: [place] [facility];  Service: Cardiovascular;  Laterality: N/A;   LEFT HEART CATH AND CORONARY ANGIOGRAPHY N/A 07/15/2021   Procedure: LEFT HEART CATH AND CORONARY ANGIOGRAPHY;  Surgeon: Lorretta Harp, MD;  Location: [place] [facility];  Service: Cardiovascular;  Laterality: N/A;   MANDIBULAR HARDWARE REMOVAL Bilateral 04/23/2020   Procedure: MANDIBULAR HARDWARE REMOVAL;  Surgeon: Izora Gala, MD;  Location: [place];  Service: ENT;  Laterality: Bilateral;   ORIF MANDIBULAR FRACTURE N/A 03/14/2020   Procedure: OPEN REDUCTION INTERNAL FIXATION (ORIF) MID  FACE FRACTURE, MANDIBULAR FIXATION MODIFIED ARCH BARS;  Surgeon: Izora Gala, MD;  Location: [place];  Service: ENT;  Laterality: N/A;   SCAR REVISION N/A 04/23/2020   Procedure: SCAR REVISION/ Upper Lip Repair;  Surgeon: Izora Gala, MD;  Location: [place];  Service: ENT;  Laterality: N/A;    Social History   Socioeconomic History   Marital status: Divorced    Spouse name: Not on file   Number of children: Not on file   Years of education: Not on file   Highest education level: Not on file  Occupational History   Not on file  Tobacco Use   Smoking status: Former   Smokeless tobacco: Never  Vaping Use   Vaping Use: Never used  Substance and Sexual Activity   Alcohol use: Yes    Alcohol/week: 21.0 standard drinks    Types: 21 Cans of beer per week    Comment: 2 beers daily   Drug use: No   Sexual activity: Not on file  Other Topics Concern   Not on file  Social History Narrative   Mr. Gunby has a restraining order against one of his sons.   Social Determinants of Health   Financial Resource Strain: Low Risk    Difficulty of Paying Living Expenses: Not very hard  Food Insecurity: Food Insecurity Present   Worried About [place] in the Last Year: Sometimes true   Ran Out of Food in the  Last Year: Sometimes true  Transportation Needs: No Transportation Needs   Lack of Transportation (Medical): No   Lack of Transportation (Non-Medical): No  Physical Activity: Sufficiently Active   Days of Exercise per Week: 7 days   Minutes of Exercise per Session: 40 min  Stress: Stress Concern Present   Feeling of Stress : Rather much  Social  Connections: Moderately Integrated   Frequency of Communication with Friends and Family: More than three times a week   Frequency of Social Gatherings with Friends and Family: More than three times a week   Attends Religious Services: More than 4 times per year   Active Member of [employer] or Organizations: Yes   Attends Music therapist: More than 4 times per year   Marital Status: Divorced  Human resources officer Violence: Not At Risk   Fear of Current or Ex-Partner: No   Emotionally Abused: No   Physically Abused: No   Sexually Abused: No        Objective:    BP 128/76    Pulse 87    Temp (!) 97.4 F (36.3 C) (Temporal)    Ht '5\' 6"'  (1.676 m)    Wt 148 lb 12.8 oz (67.5 kg)    SpO2 96%    BMI 24.02 kg/m   Wt Readings from Last 3 Encounters:  10/15/21 148 lb 12.8 oz (67.5 kg)  08/30/21 147 lb 9.6 oz (67 kg)  07/25/21 143 lb 3.2 oz (65 kg)    Physical Exam Vitals reviewed.  Constitutional:      General: He is not in acute distress.    Appearance: Normal appearance. He is normal weight. He is not ill-appearing, toxic-appearing or diaphoretic.  HENT:     Head: Normocephalic and atraumatic.  Eyes:     General: No scleral icterus.       Right eye: No discharge.        Left eye: No discharge.     Conjunctiva/sclera: Conjunctivae normal.  Cardiovascular:     Rate and Rhythm: Normal rate and regular rhythm.     Heart sounds: Normal heart sounds. No murmur heard.   No friction rub. No gallop.  Pulmonary:     Effort: Pulmonary effort is normal. No respiratory distress.     Breath sounds: Normal breath sounds. No stridor. No  wheezing, rhonchi or rales.  Musculoskeletal:        General: Normal range of motion.     Cervical back: Normal range of motion.  Skin:    General: Skin is warm and dry.  Neurological:     Mental Status: He is alert and oriented to person, place, and time. Mental status is at baseline.  Psychiatric:        Mood and Affect: Mood normal.        Behavior: Behavior normal.        Thought Content: Thought content normal.        Judgment: Judgment normal.    Lab Results  Component Value Date   TSH 0.704 01/02/2021   Lab Results  Component Value Date   WBC 10.7 07/25/2021   HGB 16.3 07/25/2021   HCT 48.6 07/25/2021   MCV 94 07/25/2021   PLT 223 07/25/2021   Lab Results  Component Value Date   NA 140 10/03/2021   K 4.8 10/03/2021   CO2 23 10/03/2021   GLUCOSE 91 10/03/2021   BUN 15 10/03/2021   CREATININE 0.84 10/03/2021   BILITOT 0.5 07/25/2021   ALKPHOS 76 07/25/2021   AST 23 07/25/2021   ALT 24 07/25/2021   PROT 6.6 07/25/2021   ALBUMIN 4.0 07/25/2021   CALCIUM 9.1 10/03/2021   ANIONGAP 8 07/16/2021   EGFR 91 10/03/2021   Lab Results  Component Value Date   CHOL 132 07/13/2021   Lab Results  Component Value Date   HDL 39 (  L) 07/13/2021   Lab Results  Component Value Date   LDLCALC 76 07/13/2021   Lab Results  Component Value Date   TRIG 83 07/13/2021   Lab Results  Component Value Date   CHOLHDL 3.4 07/13/2021   Lab Results  Component Value Date   HGBA1C 5.2 07/13/2021

## 2021-10-17 ENCOUNTER — Telehealth: Payer: Medicare Other | Admitting: *Deleted

## 2021-10-21 ENCOUNTER — Encounter: Payer: Self-pay | Admitting: Family Medicine

## 2021-10-22 DIAGNOSIS — I10 Essential (primary) hypertension: Secondary | ICD-10-CM | POA: Insufficient documentation

## 2021-10-22 NOTE — Progress Notes (Signed)
Cardiology Office Note:    Date:  10/23/2021   ID:  [name] [name]., DOB 29-May-1946, MRN [id number]  PCP:  [name], FNP  [facility] Providers Cardiologist:  Mertie Moores, MD    Referring MD: [name], FNP   Chief Complaint:  Follow-up for CAD, CHF    Patient Profile: Coronary artery disease Hx of prior MI s/p stent to RCA in 2002 S/p stent to LAD NSTEMI 10/22 s/p overlapping DES to RCA (ISR)  (HFrEF) heart failure with reduced ejection fraction  Ischemic CM Echocardiogram 11/2018: EF 45-50, inf-lat HK Echocardiogram 2021-07-22: EF 25, global HK worse in inf wall  Hypertension  Hyperlipidemia  Hx of Polio Spinal stenosis s/p surgery  Prior CV Studies: Cardiac catheterization 07/22/21 LAD mid 40, mid stent patent with 40 ISR RCA dist stent 90 ISR, dist 95 PCI: 2.5 x 8 mm Onyx Frontier DES and 2.5 x 15 mm Onyx Frontier DES (overlapping) to RCA  Echocardiogram 07/14/21 EF 25, global HK, inf HK worse, Gr 2 DD, normal RVSF, mild to mod MR, AV sclerosis w/o AS     History of Present Illness:   [name] [name]. is a 76 y.o. male with the above problem list.  He was admitted in 10/22 with a NSTEMI.  He underwent PCI with DES x 2 to the RCA.  His EF was worse.  GDMT was adjusted.  He was last seen in 11/22 by Dr. Acie Fredrickson.  He returns for f/u.  He is here alone.  He has stopped most of his medications.  He was initially placed on ticagrelor.  He had issues with shortness of breath and had to stop this.  He was then placed on clopidogrel.  Of note, since he has been on aspirin in addition to either ticagrelor or clopidogrel, he has had significant rectal bleeding.  He notes that the amount has been fairly significant at times.  Since coming off of these medications, he has not had any further bleeding.  He also notes that with medications, he has had frequent stools with urgency resulting in incontinence.  He developed a rash with clopidogrel which also resolved  after stopping.  He tried clopidogrel again and it recurred.  He has felt much better since stopping everything and only taking aspirin 81 mg daily.  He notes issues with diarrhea related to carvedilol.  He contributed bleeding to bisoprolol and stop this medication.  He also attributed them bleeding to [facility], and stopped this drug.  He went hunting in the [place] of the [place] this past weekend.  He has not had chest pain, dyspnea exertion, syncope, orthopnea, leg edema.        Past Medical History:  Diagnosis Date   Coronary artery disease    Erectile dysfunction    History of hiatal hernia    a lone time ago   Hyperlipidemia    [place] fracture [facility]) 03/22/2020   Myocardial infarction (HCC)    x 2   Polio    Current Medications: Current Meds  Medication Sig   aspirin 81 MG chewable tablet Chew 81 mg by mouth daily.   cholecalciferol (VITAMIN D3) 25 MCG (1000 UNIT) tablet Take 1,000 Units by mouth daily.    HYDROcodone-acetaminophen (NORCO) 7.5-325 MG tablet Take 1 tablet by mouth daily as needed.   nitroGLYCERIN (NITROSTAT) 0.4 MG SL tablet Place 1 tablet (0.4 mg total) under the tongue every 5 (five) minutes as needed for chest pain.  tadalafil (CIALIS) 10 MG tablet TAKE 1 TO 2 TABLETS AS DIRECTED AS NEEDED   triamcinolone cream (KENALOG) 0.1 % Apply 1 application topically 2 (two) times daily.   [DISCONTINUED] metoprolol succinate (TOPROL XL) 25 MG 24 hr tablet Take 0.5 tablets (12.5 mg total) by mouth daily.    Allergies:   Carvedilol, Lipitor [atorvastatin], and Plavix [clopidogrel]   Social History   Tobacco Use   Smoking status: Former    Types: Cigarettes   Smokeless tobacco: Never  Vaping Use   Vaping Use: Never used  Substance Use Topics   Alcohol use: Yes    Alcohol/week: 21.0 standard drinks    Types: 21 Cans of beer per week    Comment: 2 beers daily   Drug use: No    Family Hx: The patient's family history includes Diabetes in his mother; Heart disease  in his father and mother.  Review of Systems  Gastrointestinal:  Positive for hematochezia.    EKGs/Labs/Other Test Reviewed:    EKG:  EKG is not ordered today.  The ekg ordered today demonstrates N/A EKG dated 07/22/2021 is personally reviewed and interpreted.  This demonstrated sinus rhythm, heart rate 88, left axis deviation, PVCs, QTc 481  Recent Labs: 01/02/2021: TSH 0.704 10/23/2021: ALT WILL FOLLOW; BUN WILL FOLLOW; Creatinine, Ser WILL FOLLOW; Hemoglobin 14.1; Platelets 203; Potassium WILL FOLLOW; Sodium WILL FOLLOW   Recent Lipid Panel Recent Labs    07/13/21 0303  CHOL 132  TRIG 83  HDL 39*  VLDL 17  LDLCALC 76     Risk Assessment/Calculations:         Physical Exam:    VS:  BP 112/70 (BP Location: Left Arm, Patient Position: Sitting, Cuff Size: Normal)    Pulse 80    Ht 5' 6.5" (1.689 m)    Wt 149 lb 12.8 oz (67.9 kg)    SpO2 97%    BMI 23.82 kg/m     Wt Readings from Last 3 Encounters:  10/23/21 149 lb 12.8 oz (67.9 kg)  10/15/21 148 lb 12.8 oz (67.5 kg)  08/30/21 147 lb 9.6 oz (67 kg)    Constitutional:      Appearance: Healthy appearance. Not in distress.  Neck:     Vascular: JVD normal.  Pulmonary:     Effort: Pulmonary effort is normal.     Breath sounds: No wheezing. No rales.  Cardiovascular:     Normal rate. Regular rhythm. Normal S1. Normal S2.      Murmurs: There is no murmur.  Edema:    Peripheral edema absent.  Abdominal:     Palpations: Abdomen is soft.  Skin:    General: Skin is warm and dry.  Neurological:     General: No focal deficit present.     Mental Status: Alert and oriented to person, place and time.     Cranial Nerves: Cranial nerves are intact.        ASSESSMENT & PLAN:   CAD (coronary artery disease) History of prior MI with stenting to the RCA.  He also underwent PCI to the LAD.  He had a recent non-STEMI in 10/22 treated with overlapping DES x2 to the RCA due to in-stent restenosis.  He has had difficulty tolerating  medications.  He had shortness of breath with Brilinta.  He notes a rash that clearly comes back when he resumes Plavix.  He has also had significant rectal bleeding.  It has been less than 6 months since his ACS.  Ideally  he should remain on dual antiplatelet therapy for minimum of 12 months post ACS.  I will refer him to gastroenterology for further evaluation of his rectal bleeding.  I will review his case further with Dr. Acie Fredrickson to determine if we should try him on prasugrel.  The age cutoff Prasugrel is 75.  The patient does not have a history of stroke.  For now, continue aspirin 81 mg daily.  As noted I will try to slowly resume low-dose beta-blocker therapy as well as low-dose statin therapy.  Obtain CMET, CBC.  Follow-up in 6 weeks.  HFrEF (heart failure with reduced ejection fraction) ([place]) EF 25% by echocardiogram in October 2022 at the time of his non-STEMI.  We discussed the importance of GDMT and managing his heart failure.  He has had significant difficulty tolerating medications.  He could not tolerate carvedilol, bisoprolol, Entresto.  I will start him on metoprolol succinate 12.5 mg nightly to see if he can tolerate this.  He is currently doing well without signs of volume excess.  He is NYHA II.  I will arrange a follow-up echocardiogram to reassess EF to help further guide decisions with his GDMT.  Mixed hyperlipidemia We discussed the importance of aggressive lipid management in the setting of coronary disease and prior ACS.  He feels like he could tolerate lower doses of rosuvastatin in the past.  I will place him back on rosuvastatin 20 mg every Monday, Wednesday, Friday.  Rectal bleeding He has had significant issues with rectal bleeding.  He notes that he has never had colonoscopy.  I explained to him that the antiplatelet therapy increased his risk of bleeding.  However, the primary cause of his bleeding needs to be evaluated.  Obtain CMET, CBC today.  Refer to gastroenterology.            Dispo:  Return in about 6 weeks (around 12/04/2021) for Close follow up in 6 weeks with Dr.Nasher..   Medication Adjustments/Labs and Tests Ordered: Current medicines are reviewed at length with the patient today.  Concerns regarding medicines are outlined above.  Tests Ordered: Orders Placed This Encounter  Procedures   Comp Met (CMET)   CBC   Ambulatory referral to Gastroenterology   ECHOCARDIOGRAM COMPLETE   Medication Changes: Meds ordered this encounter  Medications   rosuvastatin (CRESTOR) 40 MG tablet    Sig: Take 0.5 tablets (20 mg total) by mouth 3 (three) times a week. Monday, Wed, Fri only.    Dispense:  30 tablet    Refill:  3   DISCONTD: metoprolol succinate (TOPROL XL) 25 MG 24 hr tablet    Sig: Take 0.5 tablets (12.5 mg total) by mouth daily.    Dispense:  15 tablet    Refill:  11   metoprolol succinate (TOPROL XL) 25 MG 24 hr tablet    Sig: Take 0.5 tablets (12.5 mg total) by mouth every evening.    Dispense:  15 tablet    Refill:  [address], [place] Dopp, [place]  10/23/2021 6:18 PM    [facility] [place], [place], [place]  [address] Phone: [phone]; Fax: [phone]

## 2021-10-23 ENCOUNTER — Encounter: Payer: Self-pay | Admitting: Physician Assistant

## 2021-10-23 ENCOUNTER — Ambulatory Visit: Payer: Medicare Other | Admitting: Physician Assistant

## 2021-10-23 ENCOUNTER — Other Ambulatory Visit: Payer: Self-pay

## 2021-10-23 VITALS — BP 112/70 | HR 80 | Ht 66.5 in | Wt 149.8 lb

## 2021-10-23 DIAGNOSIS — I10 Essential (primary) hypertension: Secondary | ICD-10-CM

## 2021-10-23 DIAGNOSIS — K625 Hemorrhage of anus and rectum: Secondary | ICD-10-CM | POA: Diagnosis not present

## 2021-10-23 DIAGNOSIS — I5020 Unspecified systolic (congestive) heart failure: Secondary | ICD-10-CM

## 2021-10-23 DIAGNOSIS — E782 Mixed hyperlipidemia: Secondary | ICD-10-CM

## 2021-10-23 DIAGNOSIS — I2510 Atherosclerotic heart disease of native coronary artery without angina pectoris: Secondary | ICD-10-CM

## 2021-10-23 LAB — COMPREHENSIVE METABOLIC PANEL
ALT: 23 IU/L (ref 0–44)
AST: 25 IU/L (ref 0–40)
Albumin/Globulin Ratio: 1.8 (ref 1.2–2.2)
Albumin: 4.2 g/dL (ref 3.7–4.7)
Alkaline Phosphatase: 78 IU/L (ref 44–121)
BUN/Creatinine Ratio: 21 (ref 10–24)
BUN: 17 mg/dL (ref 8–27)
Bilirubin Total: 0.3 mg/dL (ref 0.0–1.2)
CO2: 25 mmol/L (ref 20–29)
Calcium: 9.5 mg/dL (ref 8.6–10.2)
Chloride: 104 mmol/L (ref 96–106)
Creatinine, Ser: 0.82 mg/dL (ref 0.76–1.27)
Globulin, Total: 2.4 g/dL (ref 1.5–4.5)
Glucose: 86 mg/dL (ref 70–99)
Potassium: 4.9 mmol/L (ref 3.5–5.2)
Sodium: 139 mmol/L (ref 134–144)
Total Protein: 6.6 g/dL (ref 6.0–8.5)
eGFR: 92 mL/min/{1.73_m2} (ref >59)

## 2021-10-23 LAB — CBC
Hematocrit: 41.1 % (ref 37.5–51.0)
Hemoglobin: 14.1 g/dL (ref 13.0–17.7)
MCH: 32.1 pg (ref 26.6–33.0)
MCHC: 34.3 g/dL (ref 31.5–35.7)
MCV: 94 fL (ref 79–97)
Platelets: 203 10*3/uL (ref 150–450)
RBC: 4.39 x10E6/uL (ref 4.14–5.80)
RDW: 12 % (ref 11.6–15.4)
WBC: 8.8 10*3/uL (ref 3.4–10.8)

## 2021-10-23 MED ORDER — METOPROLOL SUCCINATE ER 25 MG PO TB24: 12.5000 mg | ORAL_TABLET | Freq: Every day | ORAL | 11 refills | Status: DC

## 2021-10-23 MED ORDER — METOPROLOL SUCCINATE ER 25 MG PO TB24: 12.5000 mg | ORAL_TABLET | Freq: Every evening | ORAL | 11 refills | Status: DC

## 2021-10-23 MED ORDER — ROSUVASTATIN CALCIUM 40 MG PO TABS: 20.0000 mg | ORAL_TABLET | ORAL | 3 refills | Status: DC

## 2021-10-23 NOTE — Patient Instructions (Signed)
Medication Instructions:   RESTART Rosuvastatin one half tablet by mouth ( 20 mg ) Monday, Wed, And Friday Only. DO NOT START FOR TWO WEEKS  START Toprol one half tablet by mouth ( 12.5 mg ) every evening.    *If you need a refill on your cardiac medications before your next appointment, please call your pharmacy*   Lab Work:  TODAY!!!!! CMET/CBC  If you have labs (blood work) drawn today and your tests are completely normal, you will receive your results only by: MyChart Message (if you have MyChart) OR A paper copy in the mail If you have any lab test that is abnormal or we need to change your treatment, we will call you to review the results.   Testing/Procedures:  Your physician has requested that you have an echocardiogram. Echocardiography is a painless test that uses sound waves to create images of your heart. It provides your doctor with information about the size and shape of your heart and how well your hearts chambers and valves are working. This procedure takes approximately one hour. There are no restrictions for this procedure.   Follow-Up: At [facility], you and your health needs are our priority.  As part of our continuing mission to provide you with exceptional heart care, we have created designated Provider Care Teams.  These Care Teams include your primary Cardiologist (physician) and Advanced Practice Providers (APPs -  Physician Assistants and Nurse Practitioners) who all work together to provide you with the care you need, when you need it.  We recommend signing up for the patient portal called "MyChart".  Sign up information is provided on this After Visit Summary.  MyChart is used to connect with patients for Virtual Visits (Telemedicine).  Patients are able to view lab/test results, encounter notes, upcoming appointments, etc.  Non-urgent messages can be sent to your provider as well.   To learn more about what you can do with MyChart, go to  [email or web address].    Your next appointment:   6 week(s)  The format for your next appointment:   In Person  Provider:   Kristeen Miss, MD     Other Instructions  You have been referred to GI doctor.  The office will call you to set up appointment.

## 2021-10-23 NOTE — Assessment & Plan Note (Addendum)
History of prior MI with stenting to the RCA.  He also underwent PCI to the LAD.  He had a recent non-STEMI in 10/22 treated with overlapping DES x2 to the RCA due to in-stent restenosis.  He has had difficulty tolerating medications.  He had shortness of breath with Brilinta.  He notes a rash that clearly comes back when he resumes Plavix.  He has also had significant rectal bleeding.  It has been less than 6 months since his ACS.  Ideally he should remain on dual antiplatelet therapy for minimum of 12 months post ACS.  I will refer him to gastroenterology for further evaluation of his rectal bleeding.  I will review his case further with Dr. Elease Hashimoto to determine if we should try him on prasugrel.  The age cutoff Prasugrel is 75.  The patient does not have a history of stroke.  For now, continue aspirin 81 mg daily.  As noted I will try to slowly resume low-dose beta-blocker therapy as well as low-dose statin therapy.  Obtain CMET, CBC.  Follow-up in 6 weeks.

## 2021-10-23 NOTE — Assessment & Plan Note (Signed)
EF 25% by echocardiogram in October 2022 at the time of his non-STEMI.  We discussed the importance of GDMT and managing his heart failure.  He has had significant difficulty tolerating medications.  He could not tolerate carvedilol, bisoprolol, Entresto.  I will start him on metoprolol succinate 12.5 mg nightly to see if he can tolerate this.  He is currently doing well without signs of volume excess.  He is NYHA II.  I will arrange a follow-up echocardiogram to reassess EF to help further guide decisions with his GDMT.

## 2021-10-23 NOTE — Assessment & Plan Note (Signed)
We discussed the importance of aggressive lipid management in the setting of coronary disease and prior ACS.  He feels like he could tolerate lower doses of rosuvastatin in the past.  I will place him back on rosuvastatin 20 mg every Monday, Wednesday, Friday.

## 2021-10-23 NOTE — Assessment & Plan Note (Signed)
He has had significant issues with rectal bleeding.  He notes that he has never had colonoscopy.  I explained to him that the antiplatelet therapy increased his risk of bleeding.  However, the primary cause of his bleeding needs to be evaluated.  Obtain CMET, CBC today.  Refer to gastroenterology.

## 2021-10-24 ENCOUNTER — Ambulatory Visit (HOSPITAL_COMMUNITY): Payer: Medicare Other | Attending: Cardiology

## 2021-10-24 ENCOUNTER — Encounter: Payer: Self-pay | Admitting: Physician Assistant

## 2021-10-24 ENCOUNTER — Telehealth: Payer: Self-pay

## 2021-10-24 DIAGNOSIS — E782 Mixed hyperlipidemia: Secondary | ICD-10-CM | POA: Insufficient documentation

## 2021-10-24 DIAGNOSIS — K625 Hemorrhage of anus and rectum: Secondary | ICD-10-CM

## 2021-10-24 DIAGNOSIS — I2510 Atherosclerotic heart disease of native coronary artery without angina pectoris: Secondary | ICD-10-CM | POA: Insufficient documentation

## 2021-10-24 DIAGNOSIS — I5020 Unspecified systolic (congestive) heart failure: Secondary | ICD-10-CM

## 2021-10-24 LAB — ECHOCARDIOGRAM COMPLETE
Area-P 1/2: 3.08 cm2
S' Lateral: 4.3 cm

## 2021-10-24 NOTE — Telephone Encounter (Signed)
Spoke with patient about lab results and to clarify which medication caused him to have a rash.   Patient states in December 2022 he had a rash prior to starting Brilinta, but that after starting Brilinta he started having trouble with his breathing. He saw our Pharm D and was taken off of Brilinta and started on Plavix. Patient states after starting Plavix he developed a rash that is now starting to clear after discontinuing the Plavix.  Will send to Tereso Newcomer, PA-C for review.

## 2021-10-24 NOTE — Telephone Encounter (Signed)
-----   Message from Beatrice Lecher, [place] sent at 10/24/2021 10:52 AM EST ----- Creatinine, K+, LFTs normal. Hgb normal. At OV yesterday, pt noted a rash that resolved after stopping Plavix and recurred when he resumed it.   I see PharmD note and telephone encounters from Dec 2022 that indicate he had a rash with Brilinta and was switched to Plavix at that time. I reviewed his case with Dr. Elease Hashimoto.  We felt that we could try the low dose of Brilinta to see if he can tolerate it.   However, before trying Brilinta again, I need to clarify his rash. PLAN:  -Please ask patient if he had a rash with Brilinta or Plavix. Tereso Newcomer, PA-C    10/24/2021 10:45 AM

## 2021-10-24 NOTE — Telephone Encounter (Signed)
Spoke with patient regarding results of echocardiogram with EF remaining severely depressed. Per Tereso Newcomer, PA-C patient to have visit with Pharm D clinic to help with GDMT for CHF.  Patient scheduled for Pharm D clinic visit 11/18/21 at 02:30PM. Patient advised to continue medication changes made at OV yesterday (2/8) and to keep follow-up appt with Dr. Elease Hashimoto on 11/29/21 at 08:40AM  Patient verbalized understanding and agrees with the above plan.

## 2021-10-24 NOTE — Telephone Encounter (Signed)
-----   Message from Beatrice Lecher, [place] sent at 10/24/2021 11:38 AM EST ----- EF remains severely depressed.  We need to continue to find medications he can tolerate to manage his congestive heart failure. PLAN:  - Continue with med changes discussed yesterday. - He has an appt with Dr. Elease Hashimoto next month - please keep appt - Please arrange PharmD clinic visit in 2-3 weeks to help with GDMT for CHF Tereso Newcomer, PA-C   10/24/2021 11:28 AM

## 2021-10-25 ENCOUNTER — Other Ambulatory Visit: Payer: Self-pay | Admitting: *Deleted

## 2021-10-25 MED ORDER — TICAGRELOR 60 MG PO TABS: 60.0000 mg | ORAL_TABLET | Freq: Two times a day (BID) | ORAL | 3 refills | Status: DC

## 2021-10-25 NOTE — Telephone Encounter (Signed)
I reviewed his case with Dr. Acie Fredrickson.  His myocardial infarction was in October.  He should be on dual antiplatelet therapy for at least 1 year post MI.  Options for antiplatelet therapy are becoming limited because of intolerances.  He cannot take Plavix due to allergy.  Dr. Acie Fredrickson suggested trying Brilinta 60 mg twice daily to see if he can tolerate this.  Plan: -Continue aspirin 81 mg daily -Start ticagrelor (Brilinta) 60 mg twice daily -If he has not started Metoprolol succinate yet, he can delay this for a week to determine if he can tolerate. -Getting back on Brilinta is more important. -He can drink some caffeine/coffee to help with shortness of breath (if occurs). [place] Dopp, PA-C 10/25/2021 8:01 AM

## 2021-10-25 NOTE — Telephone Encounter (Signed)
S/w pt with recommendations. Pt is to start back on Brilinta one (1) tablet by mouth ( 60 mg) bid. Pt stated drinks a lot of coffee.  Medication list updated.  Will call back if still SOB on Brilinta and if pt starts bleeding.

## 2021-11-01 ENCOUNTER — Ambulatory Visit (INDEPENDENT_AMBULATORY_CARE_PROVIDER_SITE_OTHER): Payer: Medicare Other | Admitting: *Deleted

## 2021-11-01 DIAGNOSIS — K625 Hemorrhage of anus and rectum: Secondary | ICD-10-CM

## 2021-11-01 DIAGNOSIS — I5022 Chronic systolic (congestive) heart failure: Secondary | ICD-10-CM

## 2021-11-01 DIAGNOSIS — I25119 Atherosclerotic heart disease of native coronary artery with unspecified angina pectoris: Secondary | ICD-10-CM

## 2021-11-01 NOTE — Chronic Care Management (AMB) (Signed)
Chronic Care Management   CCM RN Visit Note  11/01/2021 Name: [name] [name]. MRN: [id number] DOB: 12-29-1945  Subjective: [name] [name]. is a 76 y.o. year old male who is a primary care patient of [name], FNP. The care management team was consulted for assistance with disease management and care coordination needs.    Engaged with patient by telephone for follow up visit in response to provider referral for case management and/or care coordination services.   Consent to Services:  The patient was given information about Chronic Care Management services, agreed to services, and gave verbal consent prior to initiation of services.  Please see initial visit note for detailed documentation.   Patient agreed to services and verbal consent obtained.   Assessment: Review of patient past medical history, allergies, medications, health status, including review of consultants reports, laboratory and other test data, was performed as part of comprehensive evaluation and provision of chronic care management services.   SDOH (Social Determinants of Health) assessments and interventions performed:    CCM Care Plan  Allergies  Allergen Reactions   Carvedilol     diarrhea   Lipitor [Atorvastatin] Other (See Comments)    "Makes pt feel funky"   Plavix [Clopidogrel] Rash    Outpatient Encounter Medications as of 11/01/2021  Medication Sig   aspirin 81 MG chewable tablet Chew 81 mg by mouth daily.   bisoprolol (ZEBETA) 5 MG tablet Take 0.5 tablets (2.5 mg total) by mouth daily. (Patient not taking: Reported on 10/23/2021)   cholecalciferol (VITAMIN D3) 25 MCG (1000 UNIT) tablet Take 1,000 Units by mouth daily.    HYDROcodone-acetaminophen (NORCO) 7.5-325 MG tablet Take 1 tablet by mouth daily as needed.   metoprolol succinate (TOPROL XL) 25 MG 24 hr tablet Take 0.5 tablets (12.5 mg total) by mouth every evening.   nitroGLYCERIN (NITROSTAT) 0.4 MG SL tablet Place 1 tablet (0.4 mg  total) under the tongue every 5 (five) minutes as needed for chest pain.   rosuvastatin (CRESTOR) 40 MG tablet Take 0.5 tablets (20 mg total) by mouth 3 (three) times a week. Monday, Wed, Fri only.   sacubitril-valsartan (ENTRESTO) 24-26 MG Take 1 tablet by mouth 2 (two) times daily. (Patient not taking: Reported on 10/23/2021)   tadalafil (CIALIS) 10 MG tablet TAKE 1 TO 2 TABLETS AS DIRECTED AS NEEDED   ticagrelor (BRILINTA) 60 MG TABS tablet Take 1 tablet (60 mg total) by mouth 2 (two) times daily.   triamcinolone cream (KENALOG) 0.1 % Apply 1 application topically 2 (two) times daily.   No facility-administered encounter medications on file as of 11/01/2021.    Patient Active Problem List   Diagnosis Date Noted   Rectal bleeding 10/23/2021   HTN (hypertension) 10/22/2021   Status post coronary artery stent placement    HFrEF (heart failure with reduced ejection fraction) ([place])    Controlled substance agreement signed 01/07/2021   History of non-ST elevation myocardial infarction (NSTEMI)    Spondylolisthesis at L4-L5 level 06/08/2017   CAD (coronary artery disease) 05/08/2017   Mixed hyperlipidemia 05/08/2017   Erectile dysfunction 03/29/2013    Conditions to be addressed/monitored:CHF, CAD, and rectal bleeding  Care Plan : [facility] Care Plan  Updates made by Ilean China, RN since 11/01/2021 12:00 AM     Problem: Chronic Disease Management Needs   Priority: High     Long-Range Goal: Patient will Work with RN Care Manager to Develop a Plan of Care Regarding Care Management and Care  Coordination Associated with CAD, MIs, CHF, and HLD   Start Date: 08/30/2021  Expected End Date: 08/30/2022  This Visit's Progress: On track  Recent Progress: On track  Priority: High  Note:   Current Barriers:  Chronic Disease Management support and education needs related to CHF, CAD, and HLD Dermatitis associated with medication use  RNCM Clinical Goal(s):  Patient will take all medications  exactly as prescribed and will call provider for medication related questions as evidenced by chart documentation and patient verbalization    continue to work with RN Care Manager and/or Social Worker to address care management and care coordination needs related to CHF, CAD, and HLD as evidenced by adherence to CM Team Scheduled appointments     through collaboration with Consulting civil engineer, provider, and care team.   Interventions: 1:1 collaboration with primary care provider regarding development and update of comprehensive plan of care as evidenced by provider attestation and co-signature Inter-disciplinary care team collaboration (see longitudinal plan of care) Evaluation of current treatment plan related to  self management and patient's adherence to plan as established by provider   CAD:  (Status: Goal on Track (progressing): YES.) Long Term Goal  Assessed understanding of CAD diagnosis Medications reviewed including medications utilized in CAD treatment plan Reviewed recent cardiology visit and medication changes. Patient has d/c Entresto, plavix, and bisoprolol due to potential side effects. He is working with cardiology to manage these medications.  Provided education on importance of blood pressure control in management of CAD; Provided education on Importance of limiting foods high in cholesterol; Counseled on importance of regular laboratory monitoring as prescribed; Counseled on the importance of exercise goals with target of 150 minutes per week Reviewed Importance of taking all medications as prescribed Reviewed Importance of attending all scheduled provider appointments Advised to report any changes in symptoms or exercise tolerance Assessed social determinant of health barriers; Reviewed and discussed office and hospital notes associated with MIs Encouraged to continue monitoring for s/s of MI and to call 911 Recommended to follow-up with PCP and Cardio as planned   Rash:   (Status: Goal Met.) Short Term Goal  Previously treated with steroid cream and pepcid for a rash that started as a small patch on his shoulder and has spread to both shoulders, down upper part of back , and upper part of chest. Red, pruritic, and bumpy.  Rash was though to be due to Brilinta and he was switched to Plavix.  Patient stopped taking Pepcid about 2 weeks ago Rash started to return on his shoulders last night Provided verbal education home management of symptoms.  Recommended cool showers and to avoid fragrant soaps, detergents, etc Use a fragrance free moisturizer like CeraVe restart pepcid and steroid cream prescribed by PCP Verified that patient does not have any difficulty breathing, swelling, etc Discussed upcoming appointment with PCP on 10/15/21 Collaborated with PCP and Fuller Canada, RPH with [facility].   Rectal Bleeding:  (Status: New goal.) Short Term Goal  Previously discussed onset of bright red rectal bleeding. Per patient, it was a large amount and seeping out in between bowel movements. denies prior history of rectal/GI bleeding Patient stopped taking pepcid and bisoprolol about 3 weeks ago and the bright red bleeding resolved 2 weeks ago. Likely unrelated and unsure of why he chose to stop those two meds Previously discussed onset of black/tarry stools about 3 weeks ago.  Patient denies decreased energy, fatigue, pallor, pain, palpitations, etc. Overall he feels well and has been  doing his usual activities.  Previously collaborated with Fuller Canada, [place] with [facility] and PCP, Hendricks Limes, FNP Cardiologist note reviewed. Patient was referred to [facility]. Contacted [facility] [phone] regarding referral. They do have it and they are ready to schedule patient. They asked that the patient contact them to setup the appointment Reached out to patient with update on GI referral. Provided contact number and advised that he should call them to schedule  appointment. Encouraged to seek medical attention for any new or worsening symptoms.  Discussed current health status. Patient feels well and is going out of town hunting this weekend.    Oral Health:  (Status: Condition stable. Not addressed this visit.) Short Term Goal  Discussed history of traumatic facial fractures and injury to jaw and teeth Discussed how this affects his ability to eat normally Previously collaborated with [facility] and Pharmacist, hospital regarding dental care Discussed visits at [facility] and plan of treatment Discussed insurance denial of additional treatments, including dental implants. Patient has a letter of medical necessity from his oral/maxillofacial surgeon but appeals have been denied. He has talked with lawyers as well. He would like to pursue treatment but it's around $16,000   Patient Goals/Self-Care Activities: Take medications as prescribed   Attend all scheduled provider appointments Perform all self care activities independently  Perform IADL's (shopping, preparing meals, housekeeping, managing finances) independently Call provider office for new concerns or questions  - call doctor with any symptoms you believe are related to your medicine - call doctor when you experience any new symptoms Limit intake of high potassium foods Call PCP or seek medical attention for any new or worsening symptoms  Start taking Pepcid again and use steroid cream on rash Take cool showers and use fragrance free detergents and soaps Call RN Care Manager as needed [phone] Keep appointment with PCP on 1/31/2 Call [facility] to schedule appointment for rectal bleeding [phone]  Plan:Telephone follow up appointment with care management team member scheduled for:  11/25/21 with RNCM The patient has been provided with contact information for the care management team and has been advised to call with any health related questions or concerns.   Chong Sicilian, BSN, RN-BC Embedded Chronic Care Manager [facility] [place] [facility] Direct Dial: [phone]

## 2021-11-01 NOTE — Patient Instructions (Signed)
Visit Information  Patient Goals/Self-Care Activities: Take medications as prescribed   Attend all scheduled provider appointments Perform all self care activities independently  Perform IADL's (shopping, preparing meals, housekeeping, managing finances) independently Call provider office for new concerns or questions  - call doctor with any symptoms you believe are related to your medicine - call doctor when you experience any new symptoms Limit intake of high potassium foods Call PCP or seek medical attention for any new or worsening symptoms  Start taking Pepcid again and use steroid cream on rash Take cool showers and use fragrance free detergents and soaps Call RN Care Manager as needed [phone] Keep appointment with PCP on 1/31/2 Call [facility] to schedule appointment for rectal bleeding [phone]  Patient verbalizes understanding of instructions and care plan provided today and agrees to view in [place]. Active MyChart status confirmed with patient.    Plan:Telephone follow up appointment with care management team member scheduled for:  11/25/21 with RNCM The patient has been provided with contact information for the care management team and has been advised to call with any health related questions or concerns.   Chong Sicilian, BSN, RN-BC Embedded Chronic Care Manager [facility] [place] [facility] Direct Dial: [phone]

## 2021-11-12 DIAGNOSIS — I5022 Chronic systolic (congestive) heart failure: Secondary | ICD-10-CM

## 2021-11-12 DIAGNOSIS — I25119 Atherosclerotic heart disease of native coronary artery with unspecified angina pectoris: Secondary | ICD-10-CM

## 2021-11-18 ENCOUNTER — Ambulatory Visit: Payer: Medicare Other | Admitting: Pharmacist

## 2021-11-18 ENCOUNTER — Other Ambulatory Visit: Payer: Self-pay

## 2021-11-18 VITALS — Wt 147.0 lb

## 2021-11-18 DIAGNOSIS — I5020 Unspecified systolic (congestive) heart failure: Secondary | ICD-10-CM | POA: Diagnosis not present

## 2021-11-18 NOTE — Progress Notes (Signed)
Patient ID: [name] [name].                 DOB: 09-01-1946                      MRN: [id number] ? ? ? ? ?HPI: ?[name] [name]. is a 76 y.o. male patient of Dr. Elease [name] referred by [name] Newcomer, PA to pharmacy clinic for HF medication management. PMH is significant for CAD s/p MI in 2002 and NSTEMI in 2022, HFrEF, HTN, HLD, Polio. Most recent LVEF 25-30% on 10/24/21. ? ?Since patient's NSTEMI he has had a hard time tolerating medications. He had significant rectal bleeding with Brilinta, rash with clopidogrel and or rectal bleeding as well. He had diarrhea with bisoprolol, carvedilol and Entresto.  He had frequent stools with urgency resulting in incontinence. This significantly affected his quality of life. His diarrhea resolved after stopping these medications. Rectal bleeding also resolved. He was only taking ASA. Dr. Elease [name] asked patient to try lower dose of Brilinta 60mg  BID and [name] [name] asked him to try metoprolol succinate 12.5mg  daily.  ? ?Patient presents to PharmD clinic today. He has not picked up Brilinta or metoprolol yet since he knew he was meeting with me. He is taking ASA 81mg  BID and rosuvastatin 20mg  daily. He feels a lot better and has been able to go hunting. He feels stressed. 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 The way it was set, sometimes when he is eating be bites his bottom lip. Trying to get insurance to pay to have it fixed. Hard for him to eat. [employer] does not want to pay.  ?He has no SOB, no swelling. Weight is stable (does not weigh himself but can tell by the way his pants fit. Does not check his BP at home. No LEE, PND or orthopnea.  ? ?Current CHF meds: metoprolol succinate 12.5mg  daily (not taking) ?Previously tried: Entresto, bisoprolol, carvedilol (diarrhea) ?BP goal:  ? ?Family History:  ?Family History  ?Problem Relation Age of Onset  ? Diabetes Mother   ? Heart disease Mother   ? Heart disease Father   ? ? ? ?Social History:  ?Social History  ? ?Socioeconomic History  ?  Marital status: Divorced  ?  Spouse name: Not on file  ? Number of children: Not on file  ? Years of education: Not on file  ? Highest education level: Not on file  ?Occupational History  ? Not on file  ?Tobacco Use  ? Smoking status: Former  ?  Types: Cigarettes  ? Smokeless tobacco: Never  ?Vaping Use  ? Vaping Use: Never used  ?Substance and Sexual Activity  ? Alcohol use: Yes  ?  Alcohol/week: 21.0 standard drinks  ?  Types: 21 Cans of beer per week  ?  Comment: 2 beers daily  ? Drug use: No  ? Sexual activity: Not on file  ?Other Topics Concern  ? Not on file  ?Social History Narrative  ? Mr. [name] [name] has a restraining order against one of his sons.  ? ?Social Determinants of Health  ? ?Financial Resource Strain: Low Risk   ? Difficulty of Paying Living Expenses: Not very hard  ?Food Insecurity: Food Insecurity Present  ? Worried About Programme researcher, broadcasting/film/video in the Last Year: Sometimes true  ? Ran Out of Food in the Last Year: Sometimes true  ?Transportation Needs: No Transportation Needs  ? Lack of Transportation (Medical): No  ? Lack  of Transportation (Non-Medical): No  ?Physical Activity: Sufficiently Active  ? Days of Exercise per Week: 7 days  ? Minutes of Exercise per Session: 40 min  ?Stress: Stress Concern Present  ? Feeling of Stress : Rather much  ?Social Connections: Moderately Integrated  ? Frequency of Communication with Friends and Family: More than three times a week  ? Frequency of Social Gatherings with Friends and Family: More than three times a week  ? Attends Religious Services: More than 4 times per year  ? Active Member of Clubs or Organizations: Yes  ? Attends Banker Meetings: More than 4 times per year  ? Marital Status: Divorced  ?Intimate Partner Violence: Not At Risk  ? Fear of Current or Ex-Partner: No  ? Emotionally Abused: No  ? Physically Abused: No  ? Sexually Abused: No  ? ? ?Diet: venison, fresh vegetables from farm ? ?Exercise: active, hunts, gardens ? ?Home BP  readings: none ? ?Wt Readings from Last 3 Encounters:  ?10/23/21 149 lb 12.8 oz (67.9 kg)  ?10/15/21 148 lb 12.8 oz (67.5 kg)  ?08/30/21 147 lb 9.6 oz (67 kg)  ? ?BP Readings from Last 3 Encounters:  ?10/23/21 112/70  ?10/15/21 128/76  ?09/03/21 108/64  ? ?Pulse Readings from Last 3 Encounters:  ?10/23/21 80  ?10/15/21 87  ?09/03/21 (!) 58  ? ? ?Renal function: ?CrCl cannot be calculated (Patient's most recent lab result is older than the maximum 21 days allowed.). ? ?Past Medical History:  ?Diagnosis Date  ? Coronary artery disease   ? Erectile dysfunction   ? HFrEF (heart failure with reduced ejection fraction) (HCC)   ? Echocardiogram 07/14/21:  EF 25, global HK, inf HK worse, Gr 2 DD, normal RVSF, mild to mod MR, AV sclerosis w/o AS Echocardiogram 2/23: EF 25-30, global HK, GLS -12.9, normal RVSF, mild to mod MR, AV sclerosis w/o AS   ? History of hiatal hernia   ? a lone time ago  ? Hyperlipidemia   ? [name] [name] fracture [facility]) 03/22/2020  ? Myocardial infarction (HCC)   ? x 2  ? Polio   ? ? ?Current Outpatient Medications on File Prior to Visit  ?Medication Sig Dispense Refill  ? aspirin 81 MG chewable tablet Chew 81 mg by mouth daily.    ? bisoprolol (ZEBETA) 5 MG tablet Take 0.5 tablets (2.5 mg total) by mouth daily. (Patient not taking: Reported on 10/23/2021) 45 tablet 3  ? cholecalciferol (VITAMIN D3) 25 MCG (1000 UNIT) tablet Take 1,000 Units by mouth daily.     ? HYDROcodone-acetaminophen (NORCO) 7.5-325 MG tablet Take 1 tablet by mouth daily as needed. 30 tablet 0  ? metoprolol succinate (TOPROL XL) 25 MG 24 hr tablet Take 0.5 tablets (12.5 mg total) by mouth every evening. 15 tablet 11  ? nitroGLYCERIN (NITROSTAT) 0.4 MG SL tablet Place 1 tablet (0.4 mg total) under the tongue every 5 (five) minutes as needed for chest pain. 10 tablet 2  ? rosuvastatin (CRESTOR) 40 MG tablet Take 0.5 tablets (20 mg total) by mouth 3 (three) times a week. Monday, Wed, Fri only. 30 tablet 3  ? sacubitril-valsartan (ENTRESTO)  24-26 MG Take 1 tablet by mouth 2 (two) times daily. (Patient not taking: Reported on 10/23/2021) 60 tablet 3  ? tadalafil (CIALIS) 10 MG tablet TAKE 1 TO 2 TABLETS AS DIRECTED AS NEEDED 10 tablet 2  ? ticagrelor (BRILINTA) 60 MG TABS tablet Take 1 tablet (60 mg total) by mouth 2 (two) times daily.  180 tablet 3  ? triamcinolone cream (KENALOG) 0.1 % Apply 1 application topically 2 (two) times daily. 80 g 1  ? ?No current facility-administered medications on file prior to visit.  ? ? ?Allergies  ?Allergen Reactions  ? Carvedilol   ?  diarrhea  ? Lipitor [Atorvastatin] Other (See Comments)  ?  "Makes pt feel funky"  ? Plavix [Clopidogrel] Rash  ? ? ? ?Assessment/Plan: ? ?1. CHF - Patient is not on any GDMT. We discussed the importance of these medications on his heart health. We discussed balancing adding these medications/making sure we maintained his quality of life. Will start with adding low dose Brilinta since he is still < 1 year out from his NSTEMI. I have asked him to decrease ASA to daily. He will start Brilinta. If in 1 week he is doing well, then he is agreeable to adding metoprolol succinate 12.5mg  daily. He will see Dr. Elease [name] 3/17 and then me 3/30. K was 4.9 on last check. He is eating less potatoes. Can attempt to add losartan possibly at next visit. ? ?Thank you, ? ?Olene Floss, Pharm.D, BCPS, CPP ?[facility]  ?[address] [place], [place] [address]  ?Phone: [phone]; Fax: [phone]  ? ? ?

## 2021-11-18 NOTE — Patient Instructions (Addendum)
Metoprolol succinate (Toprol XL) 12.5mg  daily (1/2 tablet) ?Brilinta 60mg  twice a day ?Rosuvastatin 20mg  (cut 40mg  tablet in half) ?Aspirin 81mg  daily ? ?Start taking Brilinta 60mg  twice a day ?After 1 week if you are doing ok, start Toprol XL 12.5mg  daily ? ? ? ?

## 2021-11-25 ENCOUNTER — Telehealth: Payer: Medicare Other | Admitting: *Deleted

## 2021-11-26 ENCOUNTER — Ambulatory Visit: Payer: Medicare Other | Admitting: *Deleted

## 2021-11-26 ENCOUNTER — Ambulatory Visit (INDEPENDENT_AMBULATORY_CARE_PROVIDER_SITE_OTHER): Payer: Medicare Other | Admitting: Licensed Clinical Social Worker

## 2021-11-26 DIAGNOSIS — E782 Mixed hyperlipidemia: Secondary | ICD-10-CM

## 2021-11-26 DIAGNOSIS — I5042 Chronic combined systolic (congestive) and diastolic (congestive) heart failure: Secondary | ICD-10-CM

## 2021-11-26 DIAGNOSIS — I252 Old myocardial infarction: Secondary | ICD-10-CM

## 2021-11-26 DIAGNOSIS — K089 Disorder of teeth and supporting structures, unspecified: Secondary | ICD-10-CM

## 2021-11-26 DIAGNOSIS — I25119 Atherosclerotic heart disease of native coronary artery with unspecified angina pectoris: Secondary | ICD-10-CM

## 2021-11-26 NOTE — Chronic Care Management (AMB) (Signed)
?Chronic Care Management  ? ?CCM RN Visit Note ? ?11/26/2021 ?Name: [name] [name]. MRN: [id number] DOB: 1946-07-01 ? ?Subjective: ?[name] [name]. is a 76 y.o. year old male who is a primary care patient of [name], FNP. The care management team was consulted for assistance with disease management and care coordination needs.   ? ?Engaged with patient by telephone for follow up visit in response to provider referral for case management and/or care coordination services.  ? ?Consent to Services:  ?The patient was given information about Chronic Care Management services, agreed to services, and gave verbal consent prior to initiation of services.  Please see initial visit note for detailed documentation.  ? ?Patient agreed to services and verbal consent obtained.  ? ?Assessment: Review of patient past medical history, allergies, medications, health status, including review of consultants reports, laboratory and other test data, was performed as part of comprehensive evaluation and provision of chronic care management services.  ? ?SDOH (Social Determinants of Health) assessments and interventions performed:   ? ?CCM Care Plan ? ?Allergies  ?Allergen Reactions  ? Carvedilol   ?  diarrhea  ? Lipitor [Atorvastatin] Other (See Comments)  ?  "Makes pt feel funky"  ? Plavix [Clopidogrel] Rash  ? ? ?Outpatient Encounter Medications as of 11/26/2021  ?Medication Sig  ? aspirin 81 MG chewable tablet Chew 81 mg by mouth daily.  ? cholecalciferol (VITAMIN D3) 25 MCG (1000 UNIT) tablet Take 1,000 Units by mouth daily.   ? HYDROcodone-acetaminophen (NORCO) 7.5-325 MG tablet Take 1 tablet by mouth daily as needed.  ? metoprolol succinate (TOPROL XL) 25 MG 24 hr tablet Take 0.5 tablets (12.5 mg total) by mouth every evening.  ? nitroGLYCERIN (NITROSTAT) 0.4 MG SL tablet Place 1 tablet (0.4 mg total) under the tongue every 5 (five) minutes as needed for chest pain.  ? rosuvastatin (CRESTOR) 40 MG tablet Take 0.5  tablets (20 mg total) by mouth 3 (three) times a week. Monday, Wed, Fri only.  ? tadalafil (CIALIS) 10 MG tablet TAKE 1 TO 2 TABLETS AS DIRECTED AS NEEDED  ? ticagrelor (BRILINTA) 60 MG TABS tablet Take 1 tablet (60 mg total) by mouth 2 (two) times daily. (Patient not taking: Reported on 11/18/2021)  ? triamcinolone cream (KENALOG) 0.1 % Apply 1 application topically 2 (two) times daily.  ? ?No facility-administered encounter medications on file as of 11/26/2021.  ? ? ?Patient Active Problem List  ? Diagnosis Date Noted  ? Rectal bleeding 10/23/2021  ? HTN (hypertension) 10/22/2021  ? Status post coronary artery stent placement   ? HFrEF (heart failure with reduced ejection fraction) ([place])   ? Controlled substance agreement signed 01/07/2021  ? History of non-ST elevation myocardial infarction (NSTEMI)   ? Spondylolisthesis at L4-L5 level 06/08/2017  ? CAD (coronary artery disease) 05/08/2017  ? Mixed hyperlipidemia 05/08/2017  ? Erectile dysfunction 03/29/2013  ? ? ?Conditions to be addressed/monitored:CHF, CAD, and oral/dental problems ? ?Care Plan : [facility] Care Plan  ?Updates made by Ilean China, RN since 11/26/2021 12:00 AM  ?  ? ?Problem: Chronic Disease Management Needs   ?Priority: High  ?  ? ?Long-Range Goal: Patient will Work with Consulting civil engineer to Develop a Plan of [place] with CAD, MIs, CHF, and HLD   ?Start Date: 08/30/2021  ?Expected End Date: 08/30/2022  ?This Visit's Progress: On track  ?Recent Progress: On track  ?Priority: High  ?Note:   ?Current  Barriers:  ?Chronic Disease Management support and education needs related to CHF, CAD, and HLD ?Dermatitis associated with medication use ? ?RNCM Clinical Goal(s):  ?Patient will take all medications exactly as prescribed and will call provider for medication related questions as evidenced by chart documentation and patient verbalization    ?continue to work with RN Care Manager and/or Social Worker to  address care management and care coordination needs related to CHF, CAD, and HLD as evidenced by adherence to CM Team Scheduled appointments     through collaboration with RN Care manager, provider, and care team.  ? ?Interventions: ?1:1 collaboration with primary care provider regarding development and update of comprehensive plan of care as evidenced by provider attestation and co-signature ?Inter-disciplinary care team collaboration (see longitudinal plan of care) ?Evaluation of current treatment plan related to  self management and patient's adherence to plan as established by provider ? ? ?CAD:  (Status: Goal on Track (progressing): YES.) Long Term Goal  ?Assessed understanding of CAD diagnosis ?Medications reviewed including medications utilized in CAD treatment plan ?Reviewed recent cardiology visit and discussed medication changes ?Discussed diet. Patient primarily eats home cooked meals. Limits fried/greasy foods.  ?Counseled on the importance of exercise goals with target of 150 minutes per week ?Reviewed Importance of taking all medications as prescribed ?Reviewed Importance of attending all scheduled provider appointments ?Advised to report any changes in symptoms or exercise tolerance ?Assessed social determinant of health barriers; Reviewed and discussed office and hospital notes associated with MIs ?Encouraged to continue monitoring for s/s of MI and to call 911 ?Recommended to follow-up with PCP and Cardio as planned ? ? ?Rash:  (Status: Goal Met.) Short Term Goal  ?Previously treated with steroid cream and pepcid for a rash that started as a small patch on his shoulder and has spread to both shoulders, down upper part of back , and upper part of chest. Red, pruritic, and bumpy.  ?Rash was though to be due to Brilinta and he was switched to Plavix.  ?Patient stopped taking Pepcid about 2 weeks ago ?Rash started to return on his shoulders last night ?Provided verbal education home management of  symptoms.  ?Recommended cool showers and to avoid fragrant soaps, detergents, etc ?Use a fragrance free moisturizer like CeraVe ?restart pepcid and steroid cream prescribed by PCP ?Verified that patient does not have any difficulty breathing, swelling, etc ?Discussed upcoming appointment with PCP on 10/15/21 ?Collaborated with PCP and Fuller Canada, RPH with [facility]. ? ? ?Rectal Bleeding:  (Status: Patient declined further engagement on this goal. Rectal bleeding has resolved ) Short Term Goal  ?Previously discussed onset of bright red rectal bleeding. Per patient, it was a large amount and seeping out in between bowel movements. ?denies prior history of rectal/GI bleeding ?Patient stopped taking pepcid and bisoprolol about 3 weeks ago and the bright red bleeding resolved 2 weeks ago. Likely unrelated and unsure of why he chose to stop those two meds ?Previously discussed onset of black/tarry stools about 3 weeks ago.  ?Patient denies decreased energy, fatigue, pallor, pain, palpitations, etc. Overall he feels well and has been doing his usual activities.  ?Previously collaborated with Fuller Canada, [place] with [facility] and PCP, Hendricks Limes, FNP ?Cardiologist note reviewed. Patient was referred to [facility]. ?Contacted [facility] [phone] regarding referral. They do have it and they are ready to schedule patient. They asked that the patient contact them to setup the appointment ?Reached out to patient with update on GI referral. Provided contact  number and advised that he should call them to schedule appointment. ?Encouraged to seek medical attention for any new or worsening symptoms.  ?Discussed current health status. Patient feels well and is going out of town hunting this weekend.  ? ? ?Oral Health:  (Status: Goal on track: NO.) Short Term Goal  ?Discussed history of traumatic facial fractures and injury to jaw and teeth ?Discussed how this affects his ability to eat normally and that he often  bites his lip ?Discussed use of pain medication ?Collaborated with LCSW ?Therapeutic listening utilized regarding discomfort and the difficulty he's having with his insurance company regarding coverage. He has a let

## 2021-11-26 NOTE — Chronic Care Management (AMB) (Signed)
?Chronic Care Management  ? ? Clinical Social Work Note ? ?11/26/2021 ?Name: [name] [name]. MRN: [id number] DOB: 1945/11/13 ? ?[name] [name]. is a 76 y.o. year old male who is a primary care patient of [name], FNP. The CCM team was consulted to assist the patient with chronic disease management and/or care coordination needs related to: [employer] .  ? ?Engaged with patient by telephone for follow up visit in response to provider referral for social work chronic care management and care coordination services.  ? ?Consent to Services:  ?The patient was given information about Chronic Care Management services, agreed to services, and gave verbal consent prior to initiation of services.  Please see initial visit note for detailed documentation.  ? ?Patient agreed to services and consent obtained.  ? ?Assessment: Review of patient past medical history, allergies, medications, and health status, including review of relevant consultants reports was performed today as part of a comprehensive evaluation and provision of chronic care management and care coordination services.    ? ?SDOH (Social Determinants of Health) assessments and interventions performed:  ?SDOH Interventions   ? ?Flowsheet Row Most Recent Value  ?SDOH Interventions   ?Stress Interventions Provide Counseling  [client has stress related to managing dental needs faced]  ?Depression Interventions/Treatment  Counseling  ? ?  ?  ? ?Advanced Directives Status: See [employer]yn[employer] application for related entries. ? ?CCM Care Plan ? ?Allergies  ?Allergen Reactions  ? Carvedilol   ?  diarrhea  ? Lipitor [Atorvastatin] Other (See Comments)  ?  "Makes pt feel funky"  ? Plavix [Clopidogrel] Rash  ? ? ?Outpatient Encounter Medications as of 11/26/2021  ?Medication Sig  ? aspirin 81 MG chewable tablet Chew 81 mg by mouth daily.  ? cholecalciferol (VITAMIN D3) 25 MCG (1000 UNIT) tablet Take 1,000 Units by mouth daily.   ? HYDROcodone-acetaminophen  (NORCO) 7.5-325 MG tablet Take 1 tablet by mouth daily as needed.  ? metoprolol succinate (TOPROL XL) 25 MG 24 hr tablet Take 0.5 tablets (12.5 mg total) by mouth every evening.  ? nitroGLYCERIN (NITROSTAT) 0.4 MG SL tablet Place 1 tablet (0.4 mg total) under the tongue every 5 (five) minutes as needed for chest pain.  ? rosuvastatin (CRESTOR) 40 MG tablet Take 0.5 tablets (20 mg total) by mouth 3 (three) times a week. Monday, Wed, Fri only.  ? tadalafil (CIALIS) 10 MG tablet TAKE 1 TO 2 TABLETS AS DIRECTED AS NEEDED  ? ticagrelor (BRILINTA) 60 MG TABS tablet Take 1 tablet (60 mg total) by mouth 2 (two) times daily. (Patient not taking: Reported on 11/18/2021)  ? triamcinolone cream (KENALOG) 0.1 % Apply 1 application topically 2 (two) times daily.  ? ?No facility-administered encounter medications on file as of 11/26/2021.  ? ? ?Patient Active Problem List  ? Diagnosis Date Noted  ? Rectal bleeding 10/23/2021  ? HTN (hypertension) 10/22/2021  ? Status post coronary artery stent placement   ? HFrEF (heart failure with reduced ejection fraction) ([place])   ? Controlled substance agreement signed 01/07/2021  ? History of non-ST elevation myocardial infarction (NSTEMI)   ? Spondylolisthesis at L4-L5 level 06/08/2017  ? CAD (coronary artery disease) 05/08/2017  ? Mixed hyperlipidemia 05/08/2017  ? Erectile dysfunction 03/29/2013  ? ? ?Conditions to be addressed/monitored: monitor client management of dental needs; monitor client completion of ADLs ? ?Care Plan : LCSW Care Plan  ?Updates made by Katha Cabal, LCSW since 11/26/2021 12:00 AM  ?  ? ?Problem: Coping  Skills (General Plan of Care)   ?  ? ?Goal: Coping Skills Enhanced: manage dental needs of client; manage daily needs of client   ?Start Date: 11/26/2021  ?Expected End Date: 02/25/2022  ?This Visit's Progress: On track  ?Recent Progress: On track  ?Priority: Medium  ?Note:   ?Current barriers:   ?Patient in need of assistance with connecting to community resources  for dental care needs ?Eating/Chewing Challenges ?Pain issues ?Financial Challenges ? ?Clinical Goals:  ?patient will work with SW in next 30 days to address concerns related to dental needs of client ?Patient will communicate with SW in next 30 days to discuss ADLs completion for client ?Patient will call RNCM as needed for CCM nursing support in next 30 days ? ?Clinical Interventions:  ?Collaboration with [name], FNP regarding development and update of comprehensive plan of care as evidenced by provider attestation and co-signature ?Reviewed with client his recent steps in addressing dental needs. He said he spoke via phone yesterday with manager at appeal department of his insurance carrier about his dental and medical needs. Client is becoming more angry related to his communications with insurance provider.  He has made many calls to [employer]. [name] said he has also talked with attorney about his situation and dental and medical needs. ?LCSW talked with client about eating. He said he has to eat 4-5 small meals daily. He has to cut his meat in small bites in order to eat. He said he often bites his lower lip when eating because he cannot tell if he is biting lip or not.  He said when he drinks coffee he carries a small towel to help with liquid that may drop. ?Client and SW spoke of appetite of client , of sleeping of client, and of relaxation techniques of client. He said he likes to fish and to go hunting to help him relax ?Encouraged client to talk with RNCM as needed in next 30 days about nursing needs of client. ?Regarding client mood, client is mostly frustrated and angry with insurance provider since he seems not able to be able get help he is requesting for dental needs ?Discussed support from cardiologist. Client said he has appointment with cardiologist this Friday. ?Provided counseling support for client ? ?Patient Strengths: ?Drives to needed appointments ?Takes  medications as prescribed ?Researches dental recourses in the community ?Eats meals regularly ?Advocates for self and his needs ? ?Patient Deficits ?Dental needs ?Difficulty chewing and swallowing ?Some pain issues ? ?Patient Goals:  ?Attend scheduled medical appointments ?Talked with RNCM or LCSW as needed for support ?Take medications as prescribed ?Continue to research dental resources in the area for possible help with dental needs of client ?-  ?Follow Up Plan: LCSW to call client on 01/20/22 at 1:00 PM to assess client needs.   ?  ?  ?Norva Riffle.Josph Norfleet MSW, LCSW ?Licensed Clinical Social Worker ?[place] [facility] ?[phone] ?

## 2021-11-26 NOTE — Patient Instructions (Addendum)
Visit Information ? ?Patient goals: Protect My Health (Patient).  Manage dental needs of client. Complete ADLs daily as needed ? ?Timeframe:  Short-Term Goal ?Priority:  Medium ?Progress: On Track ?Start Date:    11/26/21                          ?Expected End Date:   02/24/22              ? ?Follow Up Date 01/20/22 at 1:00 PM ?  ?Protect My Health (Patient) Manage dental needs of client . Complete ADLs daily as needed ?  ?Why is this important?   ?Screening tests can find diseases early when they are easier to treat.  ?Your doctor or nurse will talk with you about which tests are important for you.  ?Getting shots for common diseases like the flu and shingles will help prevent them.    ? ?Patient Strengths: ?Drives to needed appointments ?Takes medications as prescribed ?Researches dental recourses in the community ?Eats meals regularly ?Advocates for self and his needs ? ?Patient Deficits ?Dental needs ?Difficulty chewing and swallowing ?Some pain issues ? ?Patient Goals:  ?Attend scheduled medical appointments ?Talked with RNCM or LCSW as needed for support ?Take medications as prescribed ?Continue to research dental resources in the area for possible help with dental needs of client ?-  ?Follow Up Plan: LCSW to call client on 01/20/22 at 1:00 PM to assess client needs  ? ?Norva Riffle.Hadley Soileau MSW, LCSW ?Licensed Clinical Social Worker ?[place] [facility] ?[phone] ?

## 2021-11-26 NOTE — Patient Instructions (Signed)
Visit Information ? ? ? Patient Goals/Self-Care Activities: ?Take medications as prescribed   ?Attend all scheduled provider appointments ?Perform all self care activities independently  ?Perform IADL's (shopping, preparing meals, housekeeping, managing finances) independently ?Call provider office for new concerns or questions  ?- call doctor with any symptoms you believe are related to your medicine ?- call doctor when you experience any new symptoms ?Limit intake of high potassium foods ?Call PCP or seek medical attention for any new or worsening symptoms  ?Call cardiologist with any questions or concerns regarding medications ?Call RN Care Manager as needed [phone] ? ?Patient verbalizes understanding of instructions and care plan provided today and agrees to view in [place]. Active MyChart status confirmed with patient.   ? ?Plan:Telephone follow up appointment with care management team member scheduled for:  12/31/21 with RNCM ?The patient has been provided with contact information for the care management team and has been advised to call with any health related questions or concerns.  ? ?Chong Sicilian, BSN, RN-BC ?Embedded Chronic Care Manager ?[place] [facility] [place] [facility] ?Direct Dial: [phone] ? ?

## 2021-11-28 ENCOUNTER — Other Ambulatory Visit: Payer: Self-pay

## 2021-11-28 ENCOUNTER — Telehealth: Payer: Self-pay | Admitting: Cardiovascular Disease

## 2021-11-28 MED ORDER — ROSUVASTATIN CALCIUM 40 MG PO TABS: 20.0000 mg | ORAL_TABLET | ORAL | 2 refills | Status: DC

## 2021-11-28 NOTE — Telephone Encounter (Signed)
Agent from [employer] was calling to confirm that Dr. Elease Hashimoto received a fax.  ?The fax was from [employer] regarding a dosage verification for Rosuvastatin 20 mg  ? ? ?Case # [id number] ? ?Fax information back to [phone]  ?

## 2021-11-28 NOTE — Telephone Encounter (Signed)
Pharmacist, Marcelino Duster, was calling to make sure patient's instructions with his medicaitons match what the patient told them. Patient told pharmacy that he is taking Crestor 20 mg every other day. Informed pharmacy that instructions for medication is take 20 mg (1/2 tablet, since patient has 40 mg tablets) take on Monday, Wednesday, and Friday. Pharmacist stated that if patient is taking every other day, that this is 4 times a week. Will call patient to let him know to take Crestor 20 mg by mouth only on Mondays, Wednesdays and Fridays, not every other day. ? ?Left message for patient to call back. ?

## 2021-11-29 ENCOUNTER — Ambulatory Visit: Payer: Medicare Other | Admitting: Cardiovascular Disease

## 2021-12-04 NOTE — Telephone Encounter (Signed)
Spoke with patient, verified he is taking Rosuvastatin 20mg  (1/2 tablet of 40mg  pill) daily. ? ?Patient reports he was started on Entresto earlier this month, our note from 11/18/21 and med list show he was started on Brilinta, but when asked patient insists it is 01/18/22 that he was started on. ? ?He states he took his last dose of "Entresto" today and will not take anymore as it has been causing him to have bowel incontinence. Patient states he is frustrated with trying to find a medication for his CHF that does not cause him to experience bowel incontinence as this has been significantly effecting his quality of life. ? ?Will forward to [facility], PharmD and Dr. Sherryll Burger to review and advise. ? ?Patient has an appointment with PharmD 12/12/21 and Dr. Elease Hashimoto 01/09/22. ?

## 2021-12-05 NOTE — Telephone Encounter (Signed)
Patient should not be on Entresto. ?He was supposed to start Brilinta 60mg  BID and then if he was doing alright then start metoprolol 12.5mg  taking. ? ?I called pt to clarify. Left VM for him to call back. ?

## 2021-12-09 ENCOUNTER — Encounter: Payer: Self-pay | Admitting: Physician Assistant

## 2021-12-12 ENCOUNTER — Ambulatory Visit: Payer: Medicare Other

## 2021-12-13 ENCOUNTER — Ambulatory Visit: Payer: Medicare Other

## 2021-12-13 DIAGNOSIS — E782 Mixed hyperlipidemia: Secondary | ICD-10-CM

## 2021-12-13 DIAGNOSIS — I252 Old myocardial infarction: Secondary | ICD-10-CM

## 2021-12-13 DIAGNOSIS — I25119 Atherosclerotic heart disease of native coronary artery with unspecified angina pectoris: Secondary | ICD-10-CM

## 2021-12-13 DIAGNOSIS — I5042 Chronic combined systolic (congestive) and diastolic (congestive) heart failure: Secondary | ICD-10-CM

## 2021-12-30 NOTE — Telephone Encounter (Signed)
Patient states he isnt taken brilinta, wont take it. States he is breathing fine now and wont take anything that will affect it. Isnt taking metoprolol either. Apt with me rescheduled due to me being out of the office Friday. ?

## 2021-12-31 ENCOUNTER — Encounter: Payer: Medicare Other | Admitting: *Deleted

## 2021-12-31 DIAGNOSIS — S02412D LeFort II fracture, subsequent encounter for fracture with routine healing: Secondary | ICD-10-CM

## 2021-12-31 DIAGNOSIS — I5042 Chronic combined systolic (congestive) and diastolic (congestive) heart failure: Secondary | ICD-10-CM

## 2022-01-03 ENCOUNTER — Ambulatory Visit: Payer: Medicare Other

## 2022-01-07 ENCOUNTER — Encounter: Payer: Self-pay | Admitting: Family Medicine

## 2022-01-07 ENCOUNTER — Ambulatory Visit (INDEPENDENT_AMBULATORY_CARE_PROVIDER_SITE_OTHER): Payer: Medicare Other | Admitting: Family Medicine

## 2022-01-07 VITALS — BP 144/81 | HR 82 | Temp 97.0°F | Ht 66.5 in | Wt 147.2 lb

## 2022-01-07 DIAGNOSIS — E782 Mixed hyperlipidemia: Secondary | ICD-10-CM

## 2022-01-07 DIAGNOSIS — S02412D LeFort II fracture, subsequent encounter for fracture with routine healing: Secondary | ICD-10-CM

## 2022-01-07 DIAGNOSIS — Z79899 Other long term (current) drug therapy: Secondary | ICD-10-CM

## 2022-01-07 DIAGNOSIS — K089 Disorder of teeth and supporting structures, unspecified: Secondary | ICD-10-CM | POA: Diagnosis not present

## 2022-01-07 DIAGNOSIS — I10 Essential (primary) hypertension: Secondary | ICD-10-CM

## 2022-01-07 DIAGNOSIS — K047 Periapical abscess without sinus: Secondary | ICD-10-CM

## 2022-01-07 DIAGNOSIS — Z1159 Encounter for screening for other viral diseases: Secondary | ICD-10-CM

## 2022-01-07 MED ORDER — AMOXICILLIN 500 MG PO CAPS: 500.0000 mg | ORAL_CAPSULE | Freq: Two times a day (BID) | ORAL | 0 refills | Status: AC
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
## 2022-01-08 LAB — CMP14+EGFR
ALT: 21 IU/L (ref 0–44)
AST: 24 IU/L (ref 0–40)
Albumin/Globulin Ratio: 1.6 (ref 1.2–2.2)
Albumin: 4.2 g/dL (ref 3.7–4.7)
Alkaline Phosphatase: 89 IU/L (ref 44–121)
BUN/Creatinine Ratio: 18 (ref 10–24)
BUN: 17 mg/dL (ref 8–27)
Bilirubin Total: 0.9 mg/dL (ref 0.0–1.2)
CO2: 23 mmol/L (ref 20–29)
Calcium: 9.3 mg/dL (ref 8.6–10.2)
Chloride: 103 mmol/L (ref 96–106)
Creatinine, Ser: 0.94 mg/dL (ref 0.76–1.27)
Globulin, Total: 2.6 g/dL (ref 1.5–4.5)
Glucose: 79 mg/dL (ref 70–99)
Potassium: 4.8 mmol/L (ref 3.5–5.2)
Sodium: 139 mmol/L (ref 134–144)
Total Protein: 6.8 g/dL (ref 6.0–8.5)
eGFR: 85 mL/min/{1.73_m2} (ref >59)

## 2022-01-08 LAB — CBC WITH DIFFERENTIAL/PLATELET
Basophils Absolute: 0 10*3/uL (ref 0.0–0.2)
Basos: 1 %
EOS (ABSOLUTE): 0.2 10*3/uL (ref 0.0–0.4)
Eos: 3 %
Hematocrit: 50.3 % (ref 37.5–51.0)
Hemoglobin: 16.9 g/dL (ref 13.0–17.7)
Immature Grans (Abs): 0 10*3/uL (ref 0.0–0.1)
Immature Granulocytes: 0 %
Lymphocytes Absolute: 1.4 10*3/uL (ref 0.7–3.1)
Lymphs: 24 %
MCH: 30.6 pg (ref 26.6–33.0)
MCHC: 33.6 g/dL (ref 31.5–35.7)
MCV: 91 fL (ref 79–97)
Monocytes Absolute: 0.7 10*3/uL (ref 0.1–0.9)
Monocytes: 12 %
Neutrophils Absolute: 3.4 10*3/uL (ref 1.4–7.0)
Neutrophils: 60 %
Platelets: 163 10*3/uL (ref 150–450)
RBC: 5.52 x10E6/uL (ref 4.14–5.80)
RDW: 12.3 % (ref 11.6–15.4)
WBC: 5.6 10*3/uL (ref 3.4–10.8)

## 2022-01-08 LAB — LIPID PANEL
Chol/HDL Ratio: 3.7 ratio (ref 0.0–5.0)
Cholesterol, Total: 138 mg/dL (ref 100–199)
HDL: 37 mg/dL — ABNORMAL LOW (ref >39)
LDL Chol Calc (NIH): 94 mg/dL (ref 0–99)
Triglycerides: 22 mg/dL (ref 0–149)
VLDL Cholesterol Cal: 7 mg/dL (ref 5–40)

## 2022-01-08 LAB — HCV INTERPRETATION

## 2022-01-08 LAB — HCV AB W REFLEX TO QUANT PCR: HCV Ab: NONREACTIVE

## 2022-01-09 ENCOUNTER — Ambulatory Visit: Payer: Medicare Other | Admitting: Cardiovascular Disease

## 2022-01-09 ENCOUNTER — Encounter: Payer: Self-pay | Admitting: Cardiovascular Disease

## 2022-01-09 VITALS — BP 132/76 | HR 64 | Ht 66.5 in | Wt 145.2 lb

## 2022-01-09 DIAGNOSIS — Z79899 Other long term (current) drug therapy: Secondary | ICD-10-CM | POA: Diagnosis not present

## 2022-01-09 DIAGNOSIS — I5022 Chronic systolic (congestive) heart failure: Secondary | ICD-10-CM | POA: Diagnosis not present

## 2022-01-09 DIAGNOSIS — I5020 Unspecified systolic (congestive) heart failure: Secondary | ICD-10-CM

## 2022-01-09 MED ORDER — LOSARTAN POTASSIUM 50 MG PO TABS: 50.0000 mg | ORAL_TABLET | Freq: Every day | ORAL | 3 refills | Status: DC

## 2022-01-09 NOTE — Patient Instructions (Signed)
Medication Instructions:  ?START Losartan 50mg  daily ?*If you need a refill on your cardiac medications before your next appointment, please call your pharmacy* ? ? ?Lab Work: ?LIPIDS, ALT, BMET TODAY ?Repeat BMET in 3 weeks ?If you have labs (blood work) drawn today and your tests are completely normal, you will receive your results only by: ?MyChart Message (if you have MyChart) OR ?A paper copy in the mail ?If you have any lab test that is abnormal or we need to change your treatment, we will call you to review the results. ? ? ?Testing/Procedures: ?Referral to Lipid Clinic (pt has appt 5/11 for hypertension if can combine) ? ?ECHO ?Your physician has requested that you have an echocardiogram. Echocardiography is a painless test that uses sound waves to create images of your heart. It provides your doctor with information about the size and shape of your heart and how well your heart?s chambers and valves are working. This procedure takes approximately one hour. There are no restrictions for this procedure. ? ?Follow-Up: ?At [facility], you and your health needs are our priority.  As part of our continuing mission to provide you with exceptional heart care, we have created designated Provider Care Teams.  These Care Teams include your primary Cardiologist (physician) and Advanced Practice Providers (APPs -  Physician Assistants and Nurse Practitioners) who all work together to provide you with the care you need, when you need it. ? ?Your next appointment:   ?6 month(s) ? ?The format for your next appointment:   ?In Person ? ?Provider:   ?[facility]}  ? ?  ? ?Important Information About Sugar ? ? ? ? ?  ?
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
## 2022-01-12 LAB — TOXASSURE SELECT 13 (MW), URINE

## 2022-01-20 ENCOUNTER — Ambulatory Visit (INDEPENDENT_AMBULATORY_CARE_PROVIDER_SITE_OTHER): Payer: Medicare Other | Admitting: Licensed Clinical Social Worker

## 2022-01-20 DIAGNOSIS — I25119 Atherosclerotic heart disease of native coronary artery with unspecified angina pectoris: Secondary | ICD-10-CM

## 2022-01-20 DIAGNOSIS — I252 Old myocardial infarction: Secondary | ICD-10-CM

## 2022-01-20 DIAGNOSIS — E782 Mixed hyperlipidemia: Secondary | ICD-10-CM

## 2022-01-20 NOTE — Patient Instructions (Addendum)
Visit Information ? ?Patient goals:  Protect My Health (Patient).  Manage dental needs of client.  Complete ADLs daily as needed ? ?Timeframe:  Short-Term Goal ?Priority:  Medium ?Progress: On Track ?Start Date:    11/26/21                          ?Expected End Date:  04/21/22                ? ?Follow Up Date 03/04/22 at 1:00 PM ?  ?Protect My Health (Patient) Manage dental needs of client . Complete ADLs daily as needed ?  ?Why is this important?   ?Screening tests can find diseases early when they are easier to treat.  ?Your doctor or nurse will talk with you about which tests are important for you.  ?Getting shots for common diseases like the flu and shingles will help prevent them.    ? ?Patient Strengths: ?Drives to needed appointments ?Takes medications as prescribed ?Researches dental recourses in the community ?Eats meals regularly ?Advocates for self and his needs ? ?Patient Deficits ?Dental needs ?Difficulty chewing and swallowing ?Some pain issues ? ?Patient Goals:  ?Attend scheduled medical appointments ?Talked with RNCM or LCSW as needed for support ?Take medications as prescribed ?Continue to research dental resources in the area for possible help with dental needs of client ?-  ?Follow Up Plan: LCSW to call client on 03/04/22 at 1:00 PM to assess client needs  ? ?Norva Riffle.Donovan Gatchel MSW, LCSW ?Licensed Clinical Social Worker ?[facility] ?[phone] ?

## 2022-01-20 NOTE — Chronic Care Management (AMB) (Signed)
?Chronic Care Management  ? ? Clinical Social Work Note ? ?01/20/2022 ?Name: [name] [name]. MRN: [id number] DOB: [birth year] ? ?[name] [name]. is a 76 y.o. year old male who is a primary care patient of [name], FNP. The CCM team was consulted to assist the patient with chronic disease management and/or care coordination needs related to: [employer] .  ? ?Engaged with patient by telephone for follow up visit in response to provider referral for social work chronic care management and care coordination services.  ? ?Consent to Services:  ?The patient was given information about Chronic Care Management services, agreed to services, and gave verbal consent prior to initiation of services.  Please see initial visit note for detailed documentation.  ? ?Patient agreed to services and consent obtained.  ? ?Assessment: Review of patient past medical history, allergies, medications, and health status, including review of relevant consultants reports was performed today as part of a comprehensive evaluation and provision of chronic care management and care coordination services.    ? ?SDOH (Social Determinants of Health) assessments and interventions performed:  ?SDOH Interventions   ? ?Flowsheet Row Most Recent Value  ?SDOH Interventions   ?Food Insecurity Interventions Other (Comment)  [Due to dental problems, client can only eat certain foods. He has some trouble affording foods that he is able to eat]  ?Stress Interventions Provide Counseling  [client has stress related to managing his dental needs]  ?Depression Interventions/Treatment  Counseling  ? ?  ?  ? ?Advanced Directives Status: See Vynca application for related entries. ? ?CCM Care Plan ? ?Allergies  ?Allergen Reactions  ? Carvedilol   ?  diarrhea  ? Lipitor [Atorvastatin] Other (See Comments)  ?  "Makes pt feel funky"  ? Plavix [Clopidogrel] Rash  ? ? ?Outpatient Encounter Medications as of 01/20/2022  ?Medication Sig  ? aspirin 81 MG  chewable tablet Chew 81 mg by mouth daily.  ? cholecalciferol (VITAMIN D3) 25 MCG (1000 UNIT) tablet Take 1,000 Units by mouth daily.   ? HYDROcodone-acetaminophen (NORCO) 7.5-325 MG tablet Take 1 tablet by mouth daily as needed.  ? losartan (COZAAR) 50 MG tablet Take 1 tablet (50 mg total) by mouth daily.  ? metoprolol succinate (TOPROL XL) 25 MG 24 hr tablet Take 0.5 tablets (12.5 mg total) by mouth every evening. (Patient not taking: Reported on 01/07/2022)  ? nitroGLYCERIN (NITROSTAT) 0.4 MG SL tablet Place 1 tablet (0.4 mg total) under the tongue every 5 (five) minutes as needed for chest pain.  ? rosuvastatin (CRESTOR) 40 MG tablet Take 0.5 tablets (20 mg total) by mouth 3 (three) times a week. Monday, Wed, Fri only. (Patient not taking: Reported on 01/07/2022)  ? tadalafil (CIALIS) 10 MG tablet TAKE 1 TO 2 TABLETS AS DIRECTED AS NEEDED  ? ticagrelor (BRILINTA) 60 MG TABS tablet Take 1 tablet (60 mg total) by mouth 2 (two) times daily. (Patient not taking: Reported on 11/18/2021)  ? triamcinolone cream (KENALOG) 0.1 % Apply 1 application topically 2 (two) times daily.  ? ?No facility-administered encounter medications on file as of 01/20/2022.  ? ? ?Patient Active Problem List  ? Diagnosis Date Noted  ? Rectal bleeding 10/23/2021  ? HTN (hypertension) 10/22/2021  ? Status post coronary artery stent placement   ? HFrEF (heart failure with reduced ejection fraction) ([place])   ? Controlled substance agreement signed 01/07/2021  ? History of non-ST elevation myocardial infarction (NSTEMI)   ? Spondylolisthesis at L4-L5 level 06/08/2017  ? CAD (  coronary artery disease) 05/08/2017  ? Mixed hyperlipidemia 05/08/2017  ? Erectile dysfunction 03/29/2013  ? ? ?Conditions to be addressed/monitored: monitor client management of dental needs ? ?Care Plan : LCSW Care Plan  ?Updates made by Katha Cabal, LCSW since 01/20/2022 12:00 AM  ?  ? ?Problem: Coping Skills (General Plan of Care)   ?  ? ?Goal: Coping Skills Enhanced:  manage dental needs of client; manage daily needs of client   ?Start Date: 11/26/2021  ?Expected End Date: 04/21/2022  ?This Visit's Progress: On track  ?Recent Progress: On track  ?Priority: Medium  ?Note:   ?Current barriers:   ?Patient in need of assistance with connecting to community resources for dental care needs ?Eating/Chewing Challenges ?Pain issues ?Financial Challenges ? ?Clinical Goals:  ?patient will work with SW in next 30 days to address concerns related to dental needs of client ?Patient will communicate with SW in next 30 days to discuss ADLs completion for client ?Patient will call RNCM as needed for CCM nursing support in next 30 days ? ?Clinical Interventions:  ?Collaboration with [name], FNP regarding development and update of comprehensive plan of care as evidenced by provider attestation and co-signature ?Reviewed with client his recent steps in addressing dental needs. He has made many calls to [employer]. [name] said he has also talked with attorney about his situation and dental and medical needs. He said he plans to call insurance representative today to talk about dental needs of client ?Encouraged client to talk with RNCM as needed in next 30 days about nursing needs of client. ?Regarding client mood, client is mostly frustrated and angry with insurance provider since he seems not able to be able get help he is requesting for dental needs. He has been communicating with his insurance company for many months related to dental needs of client ?Provided counseling support for client ?Reviewed pain issues of client. ?Discussed dental abscess of client.  Client is taking prescribed pain medication for pain. He said he has had problem with abscess for several months.  He is taking medication as prescribed ?Encouraged client to call LCSW as needed in next 30 days for SW support. ? ?Patient Strengths: ?Drives to needed appointments ?Takes medications as  prescribed ?Researches dental recourses in the community ?Eats meals regularly ?Advocates for self and his needs ? ?Patient Deficits ?Dental needs ?Difficulty chewing and swallowing ?Some pain issues ? ?Patient Goals:  ?Attend scheduled medical appointments ?Talk with RNCM or LCSW as needed for support ?Take medications as prescribed ?Continue to research dental resources in the area for possible help with dental needs of client ?-  ?Follow Up Plan: LCSW to call client on 03/04/22 at 1:00 PM to assess client needs.    ?  ?Norva Riffle.Teniqua Marron MSW, LCSW ?Licensed Clinical Social Worker ?[facility] ?[phone] ?

## 2022-01-23 ENCOUNTER — Other Ambulatory Visit (HOSPITAL_COMMUNITY): Payer: Medicare Other

## 2022-01-23 ENCOUNTER — Ambulatory Visit: Payer: Medicare Other

## 2022-01-30 ENCOUNTER — Other Ambulatory Visit: Payer: Medicare Other

## 2022-02-11 ENCOUNTER — Ambulatory Visit (INDEPENDENT_AMBULATORY_CARE_PROVIDER_SITE_OTHER): Payer: Medicare Other | Admitting: Pharmacist

## 2022-02-11 ENCOUNTER — Other Ambulatory Visit: Payer: Medicare Other | Admitting: *Deleted

## 2022-02-11 ENCOUNTER — Ambulatory Visit (INDEPENDENT_AMBULATORY_CARE_PROVIDER_SITE_OTHER): Payer: Medicare Other

## 2022-02-11 ENCOUNTER — Ambulatory Visit (HOSPITAL_COMMUNITY): Payer: Medicare Other | Attending: Cardiovascular Disease

## 2022-02-11 VITALS — Wt 145.0 lb

## 2022-02-11 DIAGNOSIS — I5020 Unspecified systolic (congestive) heart failure: Secondary | ICD-10-CM | POA: Insufficient documentation

## 2022-02-11 DIAGNOSIS — I5022 Chronic systolic (congestive) heart failure: Secondary | ICD-10-CM | POA: Diagnosis not present

## 2022-02-11 DIAGNOSIS — E782 Mixed hyperlipidemia: Secondary | ICD-10-CM | POA: Diagnosis not present

## 2022-02-11 DIAGNOSIS — Z0000 Encounter for general adult medical examination without abnormal findings: Secondary | ICD-10-CM | POA: Diagnosis not present

## 2022-02-11 DIAGNOSIS — Z79899 Other long term (current) drug therapy: Secondary | ICD-10-CM

## 2022-02-11 LAB — ECHOCARDIOGRAM COMPLETE
Area-P 1/2: 4.21 cm2
S' Lateral: 4.5 cm
Weight: 2320 oz

## 2022-02-11 MED ORDER — ROSUVASTATIN CALCIUM 10 MG PO TABS
10.0000 mg | ORAL_TABLET | Freq: Every day | ORAL | 3 refills | Status: DC
Start: 2022-02-11 — End: 2022-04-14
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
## 2022-02-11 NOTE — Patient Instructions (Signed)
[name] [name] , Thank you for taking time to come for your Medicare Wellness Visit. I appreciate your ongoing commitment to your health goals. Please review the following plan we discussed and let me know if I can assist you in the future.   Screening recommendations/referrals: Colonoscopy: Declined - no longer required Recommended yearly ophthalmology/optometry visit for glaucoma screening and checkup Recommended yearly dental visit for hygiene and checkup  Vaccinations: declines all Influenza vaccine: recommend every Fall Pneumococcal vaccine: Pneumovax-23 done 03/29/2013; recommend Prevnar-13 Tdap vaccine: recommend every 10 years Shingles vaccine: recommend Shingrix which is 2 doses 2-6 months apart and over 90% effective     Covid-19: recommend 2 doses one month apart with a booster 6 months later  Advanced directives: in chart  Conditions/risks identified: Aim for 30 minutes of exercise or brisk walking, 6-8 glasses of water, and 5 servings of fruits and vegetables each day.   Next appointment: Follow up in one year for your annual wellness visit.   Preventive Care 76 Years and Older, Male  Preventive care refers to lifestyle choices and visits with your health care provider that can promote health and wellness. What does preventive care include? A yearly physical exam. This is also called an annual well check. Dental exams once or twice a year. Routine eye exams. Ask your health care provider how often you should have your eyes checked. Personal lifestyle choices, including: Daily care of your teeth and gums. Regular physical activity. Eating a healthy diet. Avoiding tobacco and drug use. Limiting alcohol use. Practicing safe sex. Taking low doses of aspirin every day. Taking vitamin and mineral supplements as recommended by your health care provider. What happens during an annual well check? The services and screenings done by your health care provider during your annual  well check will depend on your age, overall health, lifestyle risk factors, and family history of disease. Counseling  Your health care provider may ask you questions about your: Alcohol use. Tobacco use. Drug use. Emotional well-being. Home and relationship well-being. Sexual activity. Eating habits. History of falls. Memory and ability to understand (cognition). Work and work Astronomer. Screening  You may have the following tests or measurements: Height, weight, and BMI. Blood pressure. Lipid and cholesterol levels. These may be checked every 5 years, or more frequently if you are over 64 years old. Skin check. Lung cancer screening. You may have this screening every year starting at age 6 if you have a 30-pack-year history of smoking and currently smoke or have quit within the past 15 years. Fecal occult blood test (FOBT) of the stool. You may have this test every year starting at age 70. Flexible sigmoidoscopy or colonoscopy. You may have a sigmoidoscopy every 5 years or a colonoscopy every 10 years starting at age 20. Prostate cancer screening. Recommendations will vary depending on your family history and other risks. Hepatitis C blood test. Hepatitis B blood test. Sexually transmitted disease (STD) testing. Diabetes screening. This is done by checking your blood sugar (glucose) after you have not eaten for a while (fasting). You may have this done every 1-3 years. Abdominal aortic aneurysm (AAA) screening. You may need this if you are a current or former smoker. Osteoporosis. You may be screened starting at age 87 if you are at high risk. Talk with your health care provider about your test results, treatment options, and if necessary, the need for more tests. Vaccines  Your health care provider may recommend certain vaccines, such as: Influenza vaccine. This is  recommended every year. Tetanus, diphtheria, and acellular pertussis (Tdap, Td) vaccine. You may need a Td booster  every 10 years. Zoster vaccine. You may need this after age 60. Pneumococcal 13-valent conjugate (PCV13) vaccine. One dose is recommended after age 18. Pneumococcal polysaccharide (PPSV23) vaccine. One dose is recommended after age 47. Talk to your health care provider about which screenings and vaccines you need and how often you need them. This information is not intended to replace advice given to you by your health care provider. Make sure you discuss any questions you have with your health care provider. Document Released: 09/28/2015 Document Revised: 05/21/2016 Document Reviewed: 07/03/2015 Elsevier Interactive Patient Education  2017 [place] Prevention in the Home Falls can cause injuries. They can happen to people of all ages. There are many things you can do to make your home safe and to help prevent falls. What can I do on the outside of my home? Regularly fix the edges of walkways and driveways and fix any cracks. Remove anything that might make you trip as you walk through a door, such as a raised step or threshold. Trim any bushes or trees on the path to your home. Use bright outdoor lighting. Clear any walking paths of anything that might make someone trip, such as rocks or tools. Regularly check to see if handrails are loose or broken. Make sure that both sides of any steps have handrails. Any raised decks and porches should have guardrails on the edges. Have any leaves, snow, or ice cleared regularly. Use sand or salt on walking paths during winter. Clean up any spills in your garage right away. This includes oil or grease spills. What can I do in the bathroom? Use night lights. Install grab bars by the toilet and in the tub and shower. Do not use towel bars as grab bars. Use non-skid mats or decals in the tub or shower. If you need to sit down in the shower, use a plastic, non-slip stool. Keep the floor dry. Clean up any water that spills on the floor as soon  as it happens. Remove soap buildup in the tub or shower regularly. Attach bath mats securely with double-sided non-slip rug tape. Do not have throw rugs and other things on the floor that can make you trip. What can I do in the bedroom? Use night lights. Make sure that you have a light by your bed that is easy to reach. Do not use any sheets or blankets that are too big for your bed. They should not hang down onto the floor. Have a firm chair that has side arms. You can use this for support while you get dressed. Do not have throw rugs and other things on the floor that can make you trip. What can I do in the kitchen? Clean up any spills right away. Avoid walking on wet floors. Keep items that you use a lot in easy-to-reach places. If you need to reach something above you, use a strong step stool that has a grab bar. Keep electrical cords out of the way. Do not use floor polish or wax that makes floors slippery. If you must use wax, use non-skid floor wax. Do not have throw rugs and other things on the floor that can make you trip. What can I do with my stairs? Do not leave any items on the stairs. Make sure that there are handrails on both sides of the stairs and use them. Fix handrails that are  broken or loose. Make sure that handrails are as long as the stairways. Check any carpeting to make sure that it is firmly attached to the stairs. Fix any carpet that is loose or worn. Avoid having throw rugs at the top or bottom of the stairs. If you do have throw rugs, attach them to the floor with carpet tape. Make sure that you have a light switch at the top of the stairs and the bottom of the stairs. If you do not have them, ask someone to add them for you. What else can I do to help prevent falls? Wear shoes that: Do not have high heels. Have rubber bottoms. Are comfortable and fit you well. Are closed at the toe. Do not wear sandals. If you use a stepladder: Make sure that it is fully  opened. Do not climb a closed stepladder. Make sure that both sides of the stepladder are locked into place. Ask someone to hold it for you, if possible. Clearly mark and make sure that you can see: Any grab bars or handrails. First and last steps. Where the edge of each step is. Use tools that help you move around (mobility aids) if they are needed. These include: Canes. Walkers. Scooters. Crutches. Turn on the lights when you go into a dark area. Replace any light bulbs as soon as they burn out. Set up your furniture so you have a clear path. Avoid moving your furniture around. If any of your floors are uneven, fix them. If there are any pets around you, be aware of where they are. Review your medicines with your doctor. Some medicines can make you feel dizzy. This can increase your chance of falling. Ask your doctor what other things that you can do to help prevent falls. This information is not intended to replace advice given to you by your health care provider. Make sure you discuss any questions you have with your health care provider. Document Released: 06/28/2009 Document Revised: 02/07/2016 Document Reviewed: 10/06/2014 Elsevier Interactive Patient Education  2017 [employer].

## 2022-02-11 NOTE — Progress Notes (Signed)
Patient ID: [name].                 DOB: 1946-03-31                      MRN: [id number]     HPI: Emmanuel Busler. is a 76 y.o. male patient of Dr. Acie Fredrickson referred by Richardson Dopp, PA to pharmacy clinic for HF medication management. PMH is significant for CAD s/p MI in 2002 and NSTEMI in 2022, HFrEF, HTN, HLD, Polio. Most recent LVEF 25-30% on 10/24/21.  Since patient's NSTEMI he has had a hard time tolerating medications. He had significant rectal bleeding with Brilinta, rash with clopidogrel and or rectal bleeding as well. He had diarrhea with bisoprolol, carvedilol and Entresto.  He had frequent stools with urgency resulting in incontinence. This significantly affected his quality of life. His diarrhea resolved after stopping these medications. Rectal bleeding also resolved. He was only taking ASA. Dr. Acie Fredrickson asked patient to try lower dose of Brilinta 60mg  BID and Scott asked him to try metoprolol succinate 12.5mg  daily.   I saw patient 11/18/21. After a long discussion patient was agreeable to starting Brilinta 60mg  BID and metoprolol. However, on follow up via phone, patient did not start and refused to start either one. He seemed to be a little confused about which medication was which. He saw Dr. Cathie Olden on 4/27. Was started on losartan. Was also sent to PharmD clinic to discuss lipids.  Patient presents to PharmD clinic today. He is stopped taking losartan. Michela Pitcher it made him start bleeding from his rectum again. Per patient, he stopped bleeding 3 days after stopping losartan and hasn't bleed since. He took rosuvastatin in the past. Michela Pitcher he had no problems with the 10mg  or the 20mg  tablets. Had issues with the 40mg  tablets.   Current Lipid medications: none Current CHF meds: losartan 50mg  daily (not taking) Previously tried: Entresto, bisoprolol, carvedilol (diarrhea), rosuvastatin 10, 20,40, simvastatin BP goal: <130/80 LDL-C goal: <55  Labs: 01/07/22 TC 138, TG 22, HDL 37, LCL-C  94 (no therapy)  Family History:  Family History  Problem Relation Age of Onset   Diabetes Mother    Heart disease Mother    Heart disease Father      Social History:  Social History   Socioeconomic History   Marital status: Divorced    Spouse name: Not on file   Number of children: Not on file   Years of education: Not on file   Highest education level: Not on file  Occupational History   Not on file  Tobacco Use   Smoking status: Former    Types: Cigarettes   Smokeless tobacco: Never  Vaping Use   Vaping Use: Never used  Substance and Sexual Activity   Alcohol use: Yes    Alcohol/week: 21.0 standard drinks    Types: 21 Cans of beer per week    Comment: 2 beers daily   Drug use: No   Sexual activity: Not on file  Other Topics Concern   Not on file  Social History Narrative   Mr. Buchman has a restraining order against one of his sons.   Social Determinants of Health   Financial Resource Strain: Not on file  Food Insecurity: Food Insecurity Present   Worried About [place] in the Last Year: Sometimes true   Ran Out of Food in the Last Year: Sometimes true  Transportation Needs: Not on file  Physical Activity: Not on file  Stress: Stress Concern Present   Feeling of Stress : To some extent  Social Connections: Not on file  Intimate Partner Violence: Not on file    Diet: venison, fresh vegetables from farm  Exercise: active, hunts, gardens  Home BP readings: none  Wt Readings from Last 3 Encounters:  01/09/22 145 lb 3.2 oz (65.9 kg)  01/07/22 147 lb 3.2 oz (66.8 kg)  11/18/21 147 lb (66.7 kg)   BP Readings from Last 3 Encounters:  01/09/22 132/76  01/07/22 (!) 144/81  10/23/21 112/70   Pulse Readings from Last 3 Encounters:  01/09/22 64  01/07/22 82  10/23/21 80    Renal function: CrCl cannot be calculated (Patient's most recent lab result is older than the maximum 21 days allowed.).  Past Medical History:  Diagnosis Date    Coronary artery disease    Erectile dysfunction    HFrEF (heart failure with reduced ejection fraction) (HCC)    Echocardiogram 07/14/21:  EF 25, global HK, inf HK worse, Gr 2 DD, normal RVSF, mild to mod MR, AV sclerosis w/o AS Echocardiogram 2/23: EF 25-30, global HK, GLS -12.9, normal RVSF, mild to mod MR, AV sclerosis w/o AS    History of hiatal hernia    a lone time ago   Hyperlipidemia    [place] fracture [facility]) 03/22/2020   Myocardial infarction (HCC)    x 2   Polio     Current Outpatient Medications on File Prior to Visit  Medication Sig Dispense Refill   aspirin 81 MG chewable tablet Chew 81 mg by mouth daily.     cholecalciferol (VITAMIN D3) 25 MCG (1000 UNIT) tablet Take 1,000 Units by mouth daily.      HYDROcodone-acetaminophen (NORCO) 7.5-325 MG tablet Take 1 tablet by mouth daily as needed. 30 tablet 0   losartan (COZAAR) 50 MG tablet Take 1 tablet (50 mg total) by mouth daily. 90 tablet 3   metoprolol succinate (TOPROL XL) 25 MG 24 hr tablet Take 0.5 tablets (12.5 mg total) by mouth every evening. (Patient not taking: Reported on 01/07/2022) 15 tablet 11   nitroGLYCERIN (NITROSTAT) 0.4 MG SL tablet Place 1 tablet (0.4 mg total) under the tongue every 5 (five) minutes as needed for chest pain. 10 tablet 2   rosuvastatin (CRESTOR) 40 MG tablet Take 0.5 tablets (20 mg total) by mouth 3 (three) times a week. Monday, Wed, Fri only. (Patient not taking: Reported on 01/07/2022) 45 tablet 2   tadalafil (CIALIS) 10 MG tablet TAKE 1 TO 2 TABLETS AS DIRECTED AS NEEDED 10 tablet 2   ticagrelor (BRILINTA) 60 MG TABS tablet Take 1 tablet (60 mg total) by mouth 2 (two) times daily. (Patient not taking: Reported on 11/18/2021) 180 tablet 3   triamcinolone cream (KENALOG) 0.1 % Apply 1 application topically 2 (two) times daily. 80 g 1   No current facility-administered medications on file prior to visit.    Allergies  Allergen Reactions   Carvedilol     diarrhea   Lipitor [Atorvastatin] Other  (See Comments)    "Makes pt feel funky"   Plavix [Clopidogrel] Rash     Assessment/Plan:  1. CHF - Patient stopped taking losartan. Michela Pitcher it made him start bleeding again. I advised that this should not cause bleeding. He is willing to try again, but states he is going to stop it if he starts bleeding again.  2. HLD- LDL-C is above goal of <55. Patient has medicare and  PCSK9i and Marion Downer would be expensive. Current no healthwell fund. His LDL-C is not all that high. He states he is willing to retry rosuvastatin 10mg  daily. He will increase to 20mg  if he does not. Rx for rosuvastatin 10mg  daily sent to pharmacy. Labs scheduled for 8/22. Patient has a hx of stopping medications and not taking as prescribed.   Thank you,  Ramond Dial, Pharm.D, BCPS, CPP [place]  [id number], [place], [place] [address]  Phone: [phone]; Fax: [phone]

## 2022-02-11 NOTE — Patient Instructions (Addendum)
Please restart losartan 50mg  daily Start taking rosuvastatin 10mg  daily  Please come for fasting lab work 8/22. Lab opens at 7:15

## 2022-02-12 DIAGNOSIS — I252 Old myocardial infarction: Secondary | ICD-10-CM

## 2022-02-12 DIAGNOSIS — I25119 Atherosclerotic heart disease of native coronary artery with unspecified angina pectoris: Secondary | ICD-10-CM

## 2022-02-12 DIAGNOSIS — E782 Mixed hyperlipidemia: Secondary | ICD-10-CM

## 2022-02-12 LAB — BASIC METABOLIC PANEL
BUN/Creatinine Ratio: 17 (ref 10–24)
BUN: 15 mg/dL (ref 8–27)
CO2: 25 mmol/L (ref 20–29)
Calcium: 9.2 mg/dL (ref 8.6–10.2)
Chloride: 104 mmol/L (ref 96–106)
Creatinine, Ser: 0.88 mg/dL (ref 0.76–1.27)
Glucose: 82 mg/dL (ref 70–99)
Potassium: 4.4 mmol/L (ref 3.5–5.2)
Sodium: 140 mmol/L (ref 134–144)
eGFR: 89 mL/min/{1.73_m2} (ref >59)

## 2022-03-04 ENCOUNTER — Telehealth: Payer: Medicare Other | Admitting: Licensed Clinical Social Worker

## 2022-03-06 ENCOUNTER — Telehealth: Payer: Self-pay | Admitting: Family Medicine

## 2022-03-06 NOTE — Telephone Encounter (Signed)
Patient needs appt

## 2022-03-07 ENCOUNTER — Ambulatory Visit (INDEPENDENT_AMBULATORY_CARE_PROVIDER_SITE_OTHER): Payer: Medicare Other | Admitting: Family Medicine

## 2022-03-07 ENCOUNTER — Encounter: Payer: Self-pay | Admitting: Family Medicine

## 2022-03-07 VITALS — BP 128/72 | HR 76 | Temp 97.0°F | Resp 20 | Ht 66.0 in | Wt 145.0 lb

## 2022-03-07 DIAGNOSIS — R6884 Jaw pain: Secondary | ICD-10-CM

## 2022-03-07 DIAGNOSIS — G8929 Other chronic pain: Secondary | ICD-10-CM | POA: Diagnosis not present

## 2022-03-07 DIAGNOSIS — H66002 Acute suppurative otitis media without spontaneous rupture of ear drum, left ear: Secondary | ICD-10-CM

## 2022-03-07 MED ORDER — AMOXICILLIN-POT CLAVULANATE 875-125 MG PO TABS: 1.0000 | ORAL_TABLET | Freq: Two times a day (BID) | ORAL | 0 refills | Status: AC

## 2022-04-10 NOTE — Chronic Care Management (AMB) (Signed)
  Chronic Care Management   Note  12/31/2021 Name: [name] [name]. MRN: [id number] DOB: 10/24/1945  Erroneousy checked in and opened note. Please disregard.   Follow up plan: Telephone follow up appointment with care management team member scheduled for:04/11/22 with [facility] for follow-up and to decide if patient needs continued services.  Demetrios Loll, BSN, RN-BC Embedded Chronic Care Manager [facility] Care Management Direct Dial: [phone]

## 2022-04-10 NOTE — Patient Instructions (Signed)
Visit Information  Please don't hesitate to contact me if I can be of assistance to you before our next scheduled telephone appointment.  Our next appointment is by telephone on 04/11/22 at 2:15  Please call the care guide team at [phone] if you need to cancel or reschedule your appointment.   If you are experiencing a Mental Health or Behavioral Health Crisis or need someone to talk to, please call the [facility]: [phone] call [phone]   Patient verbalizes understanding of instructions and care plan provided today and agrees to view in MyChart. Active MyChart status and patient understanding of how to access instructions and care plan via MyChart confirmed with patient.     Demetrios Loll, BSN, RN-BC Embedded Chronic Care Manager [facility] Care Management Direct Dial: [phone]

## 2022-04-11 ENCOUNTER — Ambulatory Visit: Payer: Medicare Other | Admitting: *Deleted

## 2022-04-11 ENCOUNTER — Encounter: Payer: Self-pay | Admitting: *Deleted

## 2022-04-11 ENCOUNTER — Other Ambulatory Visit: Payer: Self-pay | Admitting: Family Medicine

## 2022-04-11 DIAGNOSIS — Z79899 Other long term (current) drug therapy: Secondary | ICD-10-CM

## 2022-04-11 DIAGNOSIS — G8929 Other chronic pain: Secondary | ICD-10-CM

## 2022-04-11 DIAGNOSIS — S02412D LeFort II fracture, subsequent encounter for fracture with routine healing: Secondary | ICD-10-CM

## 2022-04-11 DIAGNOSIS — K089 Disorder of teeth and supporting structures, unspecified: Secondary | ICD-10-CM

## 2022-04-14 ENCOUNTER — Telehealth: Payer: Self-pay | Admitting: Cardiovascular Disease

## 2022-04-14 ENCOUNTER — Ambulatory Visit: Payer: Medicare Other | Admitting: Family Medicine

## 2022-04-14 ENCOUNTER — Encounter: Payer: Self-pay | Admitting: Family Medicine

## 2022-04-14 ENCOUNTER — Ambulatory Visit (INDEPENDENT_AMBULATORY_CARE_PROVIDER_SITE_OTHER): Payer: Medicare Other | Admitting: Family Medicine

## 2022-04-14 VITALS — BP 140/74 | HR 66 | Temp 98.2°F | Ht 66.0 in | Wt 144.4 lb

## 2022-04-14 DIAGNOSIS — G8929 Other chronic pain: Secondary | ICD-10-CM

## 2022-04-14 DIAGNOSIS — R6884 Jaw pain: Secondary | ICD-10-CM | POA: Diagnosis not present

## 2022-04-14 MED ORDER — ROSUVASTATIN CALCIUM 20 MG PO TABS
20.0000 mg | ORAL_TABLET | Freq: Every day | ORAL | 3 refills | Status: DC
Start: 2022-04-14 — End: 2023-05-13

## 2022-04-14 MED ORDER — PREDNISONE 10 MG PO TABS: ORAL_TABLET | ORAL | 0 refills | Status: DC

## 2022-04-14 NOTE — Patient Instructions (Signed)
Visit Information  Thank you for taking time to visit with me today. Please don't hesitate to contact me if I can be of assistance to your before our next scheduled telephone appointment.  Following are the goals we discussed today:  Take medications as prescribed Reach out to provider with any new or worsening symptoms Keep follow-up appointment with Care Coordination nurse  Plan: Telephone follow up appointment with care management team member scheduled for:  04/29/22 at 1:00 with Edd Arbour, RN Care Coordinator The patient has been provided with contact information for the care management team and has been advised to call with any health related questions or concerns.   Your next appointment is by telephone on 04/29/22 at 1:00pm with Edd Arbour, RN Care Coordinator  Please call the care guide team at [phone] if you need to cancel or reschedule your appointment.   If you are experiencing a Mental Health or Behavioral Health Crisis or need someone to talk to, please call the [facility]: [phone] call [phone]   Patient verbalizes understanding of instructions and care plan provided today and agrees to view in MyChart. Active MyChart status and patient understanding of how to access instructions and care plan via MyChart confirmed with patient.     Demetrios Loll, BSN, RN-BC Engineer, materials Dial: [phone]

## 2022-04-14 NOTE — Chronic Care Management (AMB) (Signed)
Care Management    RN Visit Note 04/11/2022  Name: [name] [name]. MRN: [id number] DOB: September 25, 1945  Subjective: [name] [name]. is a 76 y.o. year old male who is a primary care patient of [name], FNP. The care management team was consulted for assistance with disease management and care coordination needs.    Engaged with patient by telephone for follow up visit in response to provider referral for case management and/or care coordination services.   Consent to Services:   [name] was given information about Care Management services today including:  Care Management services includes personalized support from designated clinical staff supervised by his physician, including individualized plan of care and coordination with other care providers 24/7 contact phone numbers for assistance for urgent and routine care needs. The patient may stop case management services at any time by phone call to the office staff.  Patient agreed to services and consent obtained.   Assessment: Review of patient past medical history, allergies, medications, health status, including review of consultants reports, laboratory and other test data, was performed as part of comprehensive evaluation and provision of chronic care management services.   SDOH (Social Determinants of Health) assessments and interventions performed:    Care Plan  Allergies  Allergen Reactions   Carvedilol     diarrhea   Lipitor [Atorvastatin] Other (See Comments)    "Makes pt feel funky"   Plavix [Clopidogrel] Rash    Outpatient Encounter Medications as of 04/11/2022  Medication Sig   aspirin 81 MG chewable tablet Chew 81 mg by mouth daily.   cholecalciferol (VITAMIN D3) 25 MCG (1000 UNIT) tablet Take 1,000 Units by mouth daily.    HYDROcodone-acetaminophen (NORCO) 7.5-325 MG tablet Take 1 tablet by mouth daily as needed.   losartan (COZAAR) 50 MG tablet Take 1 tablet (50 mg total) by mouth daily.    metoprolol succinate (TOPROL XL) 25 MG 24 hr tablet Take 0.5 tablets (12.5 mg total) by mouth every evening.   nitroGLYCERIN (NITROSTAT) 0.4 MG SL tablet Place 1 tablet (0.4 mg total) under the tongue every 5 (five) minutes as needed for chest pain.   rosuvastatin (CRESTOR) 10 MG tablet Take 1 tablet (10 mg total) by mouth daily.   tadalafil (CIALIS) 10 MG tablet TAKE 1 TO 2 TABLETS AS DIRECTED AS NEEDED   ticagrelor (BRILINTA) 60 MG TABS tablet Take 1 tablet (60 mg total) by mouth 2 (two) times daily.   triamcinolone cream (KENALOG) 0.1 % Apply 1 application topically 2 (two) times daily.   No facility-administered encounter medications on file as of 04/11/2022.    Patient Active Problem List   Diagnosis Date Noted   Rectal bleeding 10/23/2021   HTN (hypertension) 10/22/2021   Status post coronary artery stent placement    HFrEF (heart failure with reduced ejection fraction) ([place])    Controlled substance agreement signed 01/07/2021   History of non-ST elevation myocardial infarction (NSTEMI)    Spondylolisthesis at L4-L5 level 06/08/2017   CAD (coronary artery disease) 05/08/2017   Mixed hyperlipidemia 05/08/2017   Erectile dysfunction 03/29/2013    Conditions to be addressed/monitored:  oral complications after fracture  Care Plan : Pax  Completed 04/11/2022   Problem: Chronic Disease Management Needs Resolved 04/11/2022  Priority: High     Long-Range Goal: Patient will Work with RN Care Manager to Develop a Saline with CAD, MIs, CHF, and HLD Completed 04/11/2022  Start Date: 08/30/2021  Expected End Date: 08/30/2022  Recent Progress: On track  Priority: High  Note:   RNCM care plans closed. Transitioning over to Care Coordination.   Current Barriers:  Chronic Disease Management support and education needs related to CHF, CAD, and HLD Dermatitis associated with medication use  RNCM Clinical Goal(s):   Patient will take all medications exactly as prescribed and will call provider for medication related questions as evidenced by chart documentation and patient verbalization    continue to work with RN Care Manager and/or Social Worker to address care management and care coordination needs related to CHF, CAD, and HLD as evidenced by adherence to CM Team Scheduled appointments     through collaboration with Consulting civil engineer, provider, and care team.   Interventions: 1:1 collaboration with primary care provider regarding development and update of comprehensive plan of care as evidenced by provider attestation and co-signature Inter-disciplinary care team collaboration (see longitudinal plan of care) Evaluation of current treatment plan related to  self management and patient's adherence to plan as established by provider   CAD:  (Status: Goal on Track (progressing): YES.) Long Term Goal  Assessed understanding of CAD diagnosis Medications reviewed including medications utilized in CAD treatment plan Reviewed recent cardiology visit and discussed medication changes Discussed diet. Patient primarily eats home cooked meals. Limits fried/greasy foods.  Counseled on the importance of exercise goals with target of 150 minutes per week Reviewed Importance of taking all medications as prescribed Reviewed Importance of attending all scheduled provider appointments Advised to report any changes in symptoms or exercise tolerance Assessed social determinant of health barriers; Reviewed and discussed office and hospital notes associated with MIs Encouraged to continue monitoring for s/s of MI and to call 911 Recommended to follow-up with PCP and Cardio as planned   Rash:  (Status: Goal Met.) Short Term Goal  Previously treated with steroid cream and pepcid for a rash that started as a small patch on his shoulder and has spread to both shoulders, down upper part of back , and upper part of chest. Red,  pruritic, and bumpy.  Rash was though to be due to Brilinta and he was switched to Plavix.  Patient stopped taking Pepcid about 2 weeks ago Rash started to return on his shoulders last night Provided verbal education home management of symptoms.  Recommended cool showers and to avoid fragrant soaps, detergents, etc Use a fragrance free moisturizer like CeraVe restart pepcid and steroid cream prescribed by PCP Verified that patient does not have any difficulty breathing, swelling, etc Discussed upcoming appointment with PCP on 10/15/21 Collaborated with PCP and Fuller Canada, RPH with [facility].   Rectal Bleeding:  (Status: Patient declined further engagement on this goal. Rectal bleeding has resolved ) Short Term Goal  Previously discussed onset of bright red rectal bleeding. Per patient, it was a large amount and seeping out in between bowel movements. denies prior history of rectal/GI bleeding Patient stopped taking pepcid and bisoprolol about 3 weeks ago and the bright red bleeding resolved 2 weeks ago. Likely unrelated and unsure of why he chose to stop those two meds Previously discussed onset of black/tarry stools about 3 weeks ago.  Patient denies decreased energy, fatigue, pallor, pain, palpitations, etc. Overall he feels well and has been doing his usual activities.  Previously collaborated with Fuller Canada, [place] with [facility] and PCP, Hendricks Limes, FNP Cardiologist note reviewed. Patient was referred to [facility]. Contacted [facility] [phone] regarding referral. They  do have it and they are ready to schedule patient. They asked that the patient contact them to setup the appointment Reached out to patient with update on GI referral. Provided contact number and advised that he should call them to schedule appointment. Encouraged to seek medical attention for any new or worsening symptoms.  Discussed current health status. Patient feels well and is going out of town  hunting this weekend.    Oral Health:  (Status: Goal on Track (progressing): YES.) Short Term Goal  Discussed history of traumatic facial fractures and injury to jaw and teeth Discussed how this affects his ability to eat normally and that he often bites his lip Discussed use of pain medication Therapeutic listening utilized regarding discomfort and the difficulty he's having with his insurance company regarding coverage. He has a letter of medical necessity from his Comptroller and he has appealed the insurance denial of previous treatment at [facility]. He has also been in touch with a lawyer and plans to utilize them if the appeal is denied and if further medically necessary treatment is denied. He has reached a settlement with one of the insurance companies and they are going to pay out a small portion. He is working with his Chief Executive Officer.  Discussed patient's desire to get letters of medical necessity stating that the procedure isn't cosmetic but necessary Collaborated with [facility] front office staff to schedule appt with PCP for painful mouth sores and to discuss letter of medical necessity. He is also getting a letter from his Buyer, retail.  Scheduled with Jackelyn Poling, RN Care Coordinator for follow-up in August   Patient Goals/Self-Care Activities: Take medications as prescribed Reach out to provider with any new or worsening symptoms Keep follow-up appointment with Care Coordination nurse  Plan: Telephone follow up appointment with care management team member scheduled for:  04/29/22 at 1:00 with Jackelyn Poling, RN Care Coordinator The patient has been provided with contact information for the care management team and has been advised to call with any health related questions or concerns.   Chong Sicilian, BSN, RN-BC Proofreader Dial: [phone]

## 2022-04-14 NOTE — Telephone Encounter (Signed)
Suggest 20mg    Pt c/o medication issue:  1. Name of Medication:   rosuvastatin (CRESTOR) 10 MG tablet    2. How are you currently taking this medication (dosage and times per day)?   3. Are you having a reaction (difficulty breathing--STAT)? no  4. What is your medication issue? Stew from [facility] pharmacy called stating that there is some confusion on the dosage pt is supposed to be taking for this medication. He states he thought it was supposed to be 20mg , but he only has a prescription for 40mg . Please advise. He also asking to speak with Melissa RPH-CPP if possible

## 2022-04-14 NOTE — Progress Notes (Signed)
Subjective:  Patient ID: [name] [name]., male    DOB: [birth year]  Age: 76 y.o. MRN: [id number]  CC: Jaw Pain   HPI [name] [name]. presents for right jaw pain. Lip hurts since jaw reconstruction. Left side was hurting last visit. That is better. Now pointing to the right masseter region. Hurts to chew. Pt. Has history of [place] 2 fractures with surgical repair 2 years ago by Dr. Pollyann Kennedy. He reports chronic pain for which he has used oxycodone. Now requests refill of the oxycodone. Can't eat. Losing weight.      04/14/2022   10:22 AM 03/07/2022   11:09 AM 02/11/2022    8:24 AM  Depression screen PHQ 2/9  Decreased Interest 0 0 1  Down, Depressed, Hopeless 1 0 1  PHQ - 2 Score 1 0 2  Altered sleeping 0  2  Tired, decreased energy 0  1  Change in appetite 0  0  Feeling bad or failure about yourself  1  1  Trouble concentrating 0  1  Moving slowly or fidgety/restless 1  1  Suicidal thoughts 0  0  PHQ-9 Score 3  8  Difficult doing work/chores Somewhat difficult  Somewhat difficult    History [name] [name] has a past medical history of Coronary artery disease, Erectile dysfunction, HFrEF (heart failure with reduced ejection fraction) (HCC), History of hiatal hernia, Hyperlipidemia, [place] fracture [facility]) (03/22/2020), Myocardial infarction (HCC), and Polio.   He has a past surgical history that includes Coronary stent placement; Appendectomy; Cardiac catheterization; Cervical spine surgery; forein body removal; LEFT HEART CATH AND CORONARY ANGIOGRAPHY (N/A, 11/15/2018); ORIF mandibular fracture (N/A, 03/14/2020); Mandibular hardware removal (Bilateral, 04/23/2020); Scar revision (N/A, 04/23/2020); LEFT HEART CATH AND CORONARY ANGIOGRAPHY (N/A, 07/15/2021); and CORONARY STENT INTERVENTION (N/A, 07/15/2021).   His family history includes Diabetes in his mother; Heart disease in his father and mother.He reports that he has quit smoking. His smoking use included cigarettes. He has never used  smokeless tobacco. He reports current alcohol use of about 21.0 standard drinks of alcohol per week. He reports that he does not use drugs.    ROS Review of Systems  Constitutional:  Negative for activity change and appetite change.  HENT:  Positive for facial swelling.     Objective:  BP 140/74   Pulse 66   Temp 98.2 F (36.8 C)   Ht 5\' 6"  (1.676 m)   Wt 144 lb 6.4 oz (65.5 kg)   SpO2 99%   BMI 23.31 kg/m   BP Readings from Last 3 Encounters:  04/14/22 140/74  03/07/22 128/72  01/09/22 132/76    Wt Readings from Last 3 Encounters:  04/14/22 144 lb 6.4 oz (65.5 kg)  03/07/22 145 lb (65.8 kg)  02/11/22 145 lb (65.8 kg)     Physical Exam Constitutional:      Appearance: Normal appearance.  HENT:     Head: Normocephalic.     Right Ear: Tympanic membrane normal.     Left Ear: Tympanic membrane normal.     Mouth/Throat:     Mouth: Mucous membranes are moist.     Pharynx: Oropharynx is clear. No oropharyngeal exudate or posterior oropharyngeal erythema.     Comments: Poor dentition. No edema, erythema of the right dental ridge or of the right buccal mucosa Eyes:     Extraocular Movements: Extraocular movements intact.     Pupils: Pupils are equal, round, and reactive to light.  Neurological:     Mental  Status: He is alert.       Assessment & Plan:   [name] [name] was seen today for jaw pain.  Diagnoses and all orders for this visit:  Chronic jaw pain  Other orders -     predniSONE (DELTASONE) 10 MG tablet; Take 5 daily for 2 days followed by 4,3,2 and 1 for 2 days each.       I am having [name] [name]. [name] [name]. start on predniSONE. I am also having him maintain his aspirin, cholecalciferol, triamcinolone cream, tadalafil, HYDROcodone-acetaminophen, nitroGLYCERIN, metoprolol succinate, ticagrelor, losartan, and rosuvastatin.  Allergies as of 04/14/2022       Reactions   Carvedilol    diarrhea   Lipitor [atorvastatin] Other (See Comments)   "Makes pt feel  funky"   Plavix [clopidogrel] Rash        Medication List        Accurate as of April 14, 2022 10:47 AM. If you have any questions, ask your nurse or doctor.          aspirin 81 MG chewable tablet Chew 81 mg by mouth daily.   cholecalciferol 25 MCG (1000 UNIT) tablet Commonly known as: VITAMIN D3 Take 1,000 Units by mouth daily.   HYDROcodone-acetaminophen 7.5-325 MG tablet Commonly known as: NORCO Take 1 tablet by mouth daily as needed.   losartan 50 MG tablet Commonly known as: COZAAR Take 1 tablet (50 mg total) by mouth daily.   metoprolol succinate 25 MG 24 hr tablet Commonly known as: Toprol XL Take 0.5 tablets (12.5 mg total) by mouth every evening.   nitroGLYCERIN 0.4 MG SL tablet Commonly known as: Nitrostat Place 1 tablet (0.4 mg total) under the tongue every 5 (five) minutes as needed for chest pain.   predniSONE 10 MG tablet Commonly known as: DELTASONE Take 5 daily for 2 days followed by 4,3,2 and 1 for 2 days each. Started by: Mechele Claude, MD   rosuvastatin 10 MG tablet Commonly known as: CRESTOR Take 1 tablet (10 mg total) by mouth daily.   tadalafil 10 MG tablet Commonly known as: CIALIS TAKE 1 TO 2 TABLETS AS DIRECTED AS NEEDED   ticagrelor 60 MG Tabs tablet Commonly known as: Brilinta Take 1 tablet (60 mg total) by mouth 2 (two) times daily.   triamcinolone cream 0.1 % Commonly known as: KENALOG Apply 1 application topically 2 (two) times daily.         Follow-up: Return if symptoms worsen or fail to improve, for Pain, will need to see PCP for pain med refill or get referral by her to pain mgmt.  Mechele Claude, M.D.

## 2022-04-14 NOTE — Telephone Encounter (Signed)
Spoke to [employer] at [employer]. Plan was to start with rosuvastatin 10 and increase to 20 if pt willing. Pt requesting 20mg  tablets. Will send rx for rosuvastatin 20mg  to pharmacy.

## 2022-04-22 ENCOUNTER — Encounter: Payer: Self-pay | Admitting: *Deleted

## 2022-04-29 ENCOUNTER — Ambulatory Visit: Payer: Self-pay | Admitting: *Deleted

## 2022-04-29 NOTE — Patient Instructions (Addendum)
Visit Information  Thank you for taking time to visit with me today. Please don't hesitate to contact me if I can be of assistance to you.   Following are the goals we discussed today:   Goals Addressed               This Visit's Progress     Patient Stated     manage dental care needs [facility]) (pt-stated)   Not on track     Care Coordination Interventions: Evaluation of current treatment plan related to dental care needs and patient's adherence to plan as established by provider Provided education to patient re: pain management, SHIIP and changing of dental insurance coverage, nutritional supplements for weight/appetite loss Discussed plans with patient for ongoing care management follow up and provided patient with direct contact information for care management team Screening for signs and symptoms of depression related to chronic disease state  Assessed social determinant of health barriers   Barriers: reports limited dental insurance coverage        Our next appointment is by telephone on 05/13/22 at 1 pm  Please call the care guide team at [phone] if you need to cancel or reschedule your appointment.   If you are experiencing a Mental Health or Behavioral Health Crisis or need someone to talk to, please call the Suicide and Crisis Lifeline: 988 call the [place] National Suicide Prevention Lifeline: 404 584 4254 or TTY: 901-180-3668 TTY (787)310-9690) to talk to a trained counselor call 1-800-273-TALK (toll free, 24 hour hotline) go to [facility] [address] [place] [phone]) call 911   Patient verbalizes understanding of instructions and care plan provided today and agrees to view in MyChart. Active MyChart status and patient understanding of how to access instructions and care plan via MyChart confirmed with patient.     The patient has been provided with contact information for the care management team and has been  advised to call with any health related questions or concerns.   SIGNATURE Prince Couey L. Noelle Penner, RN, BSN, CCM [facility] Care Coordinator Office number [phone]

## 2022-04-29 NOTE — Patient Outreach (Signed)
  Care Coordination   Initial Visit Note   04/29/2022 Name: [name] [name]. MRN: [id number] DOB: [birth year]  [name] [name]. is a 76 y.o. year old male who sees Gwenlyn Fudge, FNP for primary care. I spoke with  [name] [name]. by phone today  What matters to the patients health and wellness today?  Request assist to get Crestor 20 mg in one tablet. Increased teeth mouth pain, infections plus other symptoms to include difficulty chewing without biting his jaw, lip that result into sores, poor appetite, reported weight loss (EPIC indicates loss of 3 lbs in 3 months BMI 23.32), questionable stomach ulcers. Reports this started after a traumatic [place] facial fracture (repair per Dr Pollyann Kennedy). He has a letter of medical necessity from his Interior and spatial designer, needs one from pcp and he has appealed the insurance denial of previous treatment at [facility]. He has also been in touch with a lawyer   Request care coordination for possible pain management (uses oxycodone) or procedure to decrease. He reports unsuccessful attempts in getting assistance from his medical providers (recently offered prednisone vs oxycodone) and his insurance carrier.   Education to navigating in my chart. (Reported understanding of steps to navigate)    Goals Addressed               This Visit's Progress     Patient Stated     manage dental care needs [facility]) (pt-stated)   Not on track     Care Coordination Interventions: Evaluation of current treatment plan related to dental care needs and patient's adherence to plan as established by provider Provided education to patient re: pain management, SHIIP and changing of dental insurance coverage, nutritional supplements for weight/appetite loss Discussed plans with patient for ongoing care management follow up and provided patient with direct contact information for care management team Screening for signs and symptoms of depression related to chronic disease  state  Assessed social determinant of health barriers   Barriers: reports limited dental insurance coverage        SDOH assessments and interventions completed:  Yes  SDOH Interventions Today    Flowsheet Row Most Recent Value  SDOH Interventions   Transportation Interventions Intervention Not Indicated        Care Coordination Interventions Activated:  Yes  Care Coordination Interventions:  Yes, provided   Follow up plan: Follow up call scheduled for 05/13/22 1 pm    Encounter Outcome:  Pt. Visit Completed   Janith Nielson L. Noelle Penner, RN, BSN, CCM [facility] Care Coordinator Office number [phone]

## 2022-04-30 ENCOUNTER — Other Ambulatory Visit: Payer: Self-pay | Admitting: *Deleted

## 2022-04-30 ENCOUNTER — Telehealth: Payer: Self-pay | Admitting: Family Medicine

## 2022-04-30 NOTE — Telephone Encounter (Signed)
She does not need to get involved. He already gets pain medication from me and is trying to get insurance to cover needed dental procedures. Cardiology is managing his cholesterol medication.

## 2022-04-30 NOTE — Telephone Encounter (Signed)
Kim (Care Coordinator with [facility]) called stating that she spoke with patient and wanted to let PCP know that patient complained about not being able to chew or eat very well. Also mentioned wanting pain medication and said that he only wants to take 1 (20mg ) tablet for his cholesterol.  Kim wants to know from PCP if she needs to get involved with this patient about these needs or not?  Please advise.

## 2022-04-30 NOTE — Patient Outreach (Signed)
  Care Coordination   Care coordination   Visit Note   04/30/2022 Name: [name] [name]. MRN: [id number] DOB: [birth year]  [name] [name]. is a 76 y.o. year old male who sees Gwenlyn Fudge, FNP for primary care. I  spoke with Leeroy Bock at [facility] via telephone  What matters to the patients health and wellness today?  Follow up on patient reported concerns with oral symptoms and requesting 1 cholesterol tablet    Goals Addressed               This Visit's Progress     Patient Stated     manage dental care needs [facility]) (pt-stated)        Care Coordination Interventions: Evaluation of current treatment plan related to dental care needs and patient's adherence to plan as established by provider Provided education to patient re: pain management, SHIIP and changing of dental insurance coverage, nutritional supplements for weight/appetite loss Discussed plans with patient for ongoing care management follow up and provided patient with direct contact information for care management team Screening for signs and symptoms of depression related to chronic disease state  Assessed social determinant of health barriers 04/30/22 spoke with Leeroy Bock at [facility] and left a message for pcp/RN related to patient request for one tablet for cholesterol medicine and related to patient voiced concerns on 04/29/22 for possible guidance for any needed interventions related to oral symptoms.   Barriers: reports limited dental insurance coverage        SDOH assessments and interventions completed:  No     Care Coordination Interventions Activated:  Yes  Care Coordination Interventions:  Yes, provided   Follow up plan: Follow up call scheduled for 05/13/22    Encounter Outcome:  Pt. Visit Completed   Pericles Carmicheal L. Noelle Penner, RN, BSN, CCM [facility] Care Coordinator Office number [phone]

## 2022-05-01 ENCOUNTER — Other Ambulatory Visit: Payer: Self-pay | Admitting: *Deleted

## 2022-05-01 NOTE — Telephone Encounter (Signed)
Kim aware and verbalizes understanding.  Would like to know if you want her to share information with patient such as using SHIPP for better dental coverage.

## 2022-05-01 NOTE — Telephone Encounter (Signed)
Sure. I am not sure what that is, but if it would be helpful to him that sounds great.

## 2022-05-01 NOTE — Telephone Encounter (Signed)
Aware and verbalizes understanding.  

## 2022-05-01 NOTE — Patient Outreach (Signed)
  Care Coordination   Follow Up Visit Note   05/01/2022 Name: [name] [name]. MRN: [id number] DOB: 01-16-1946  [name] [name]. is a 76 y.o. year old male who sees Gwenlyn Fudge, FNP for primary care. I  spoke with Shanda Bumps, RN at [facility]  What matters to the patients health and wellness today?  Pain medicine, dental coverage, Crestor    Goals Addressed               This Visit's Progress     Patient Stated     manage dental care needs [facility]) (pt-stated)   On track     Care Coordination Interventions: Evaluation of current treatment plan related to dental care needs and patient's adherence to plan as established by provider Provided education to patient re: pain management, SHIIP and changing of dental insurance coverage, nutritional supplements for weight/appetite loss Discussed plans with patient for ongoing care management follow up and provided patient with direct contact information for care management team Screening for signs and symptoms of depression related to chronic disease state  Assessed social determinant of health barriers 04/30/22 spoke with Leeroy Bock at [facility] and left a message for pcp/RN related to patient request for one tablet for cholesterol medicine and related to patient voiced concerns on 04/29/22 for possible guidance for any needed interventions related to oral symptoms.  05/01/22 incoming call from Rockne Menghini RN for [name] Boston, FNP. Updated RN CM does not need to be involved with patient dental concerns as pcp provides pain medications, he is trying to get insurance to cover dental needs and the cardiologist manages the Crestor.  RN CM discussed pt in [facility] case discussion meeting today. SW will forward possible dental resources. SHIIP can be offered to attempt to get better dental coverage. RN CM had pcp RN to inquire if these resources would be ok to offer to him  Barriers: reports limited dental  insurance coverage        SDOH assessments and interventions completed:  No     Care Coordination Interventions Activated:  Yes  Care Coordination Interventions:  Yes, provided   Follow up plan: Follow up call scheduled for 05/13/22    Encounter Outcome:  Pt. Visit Completed   Lauren Aguayo L. Noelle Penner, RN, BSN, CCM [facility] Care Coordinator Office number [phone]

## 2022-05-06 ENCOUNTER — Ambulatory Visit (INDEPENDENT_AMBULATORY_CARE_PROVIDER_SITE_OTHER): Payer: Medicare Other | Admitting: Nurse Practitioner

## 2022-05-06 ENCOUNTER — Other Ambulatory Visit: Payer: Medicare Other

## 2022-05-06 ENCOUNTER — Encounter: Payer: Self-pay | Admitting: Nurse Practitioner

## 2022-05-06 VITALS — BP 140/82 | HR 67 | Ht 66.0 in | Wt 144.8 lb

## 2022-05-06 DIAGNOSIS — E782 Mixed hyperlipidemia: Secondary | ICD-10-CM

## 2022-05-06 DIAGNOSIS — R42 Dizziness and giddiness: Secondary | ICD-10-CM

## 2022-05-06 DIAGNOSIS — I2510 Atherosclerotic heart disease of native coronary artery without angina pectoris: Secondary | ICD-10-CM | POA: Diagnosis not present

## 2022-05-06 DIAGNOSIS — E785 Hyperlipidemia, unspecified: Secondary | ICD-10-CM

## 2022-05-06 DIAGNOSIS — I10 Essential (primary) hypertension: Secondary | ICD-10-CM

## 2022-05-06 DIAGNOSIS — I5020 Unspecified systolic (congestive) heart failure: Secondary | ICD-10-CM | POA: Diagnosis not present

## 2022-05-06 DIAGNOSIS — Z91148 Patient's other noncompliance with medication regimen for other reason: Secondary | ICD-10-CM

## 2022-05-06 LAB — COMPREHENSIVE METABOLIC PANEL
ALT: 13 IU/L (ref 0–44)
AST: 17 IU/L (ref 0–40)
Albumin/Globulin Ratio: 1.8 (ref 1.2–2.2)
Albumin: 4.2 g/dL (ref 3.8–4.8)
Alkaline Phosphatase: 77 IU/L (ref 44–121)
BUN/Creatinine Ratio: 24 (ref 10–24)
BUN: 20 mg/dL (ref 8–27)
Bilirubin Total: 0.4 mg/dL (ref 0.0–1.2)
CO2: 24 mmol/L (ref 20–29)
Calcium: 9 mg/dL (ref 8.6–10.2)
Chloride: 103 mmol/L (ref 96–106)
Creatinine, Ser: 0.85 mg/dL (ref 0.76–1.27)
Globulin, Total: 2.3 g/dL (ref 1.5–4.5)
Glucose: 86 mg/dL (ref 70–99)
Potassium: 4.1 mmol/L (ref 3.5–5.2)
Sodium: 141 mmol/L (ref 134–144)
Total Protein: 6.5 g/dL (ref 6.0–8.5)
eGFR: 90 mL/min/{1.73_m2} (ref >59)

## 2022-05-06 LAB — LIPID PANEL
Chol/HDL Ratio: 3 ratio (ref 0.0–5.0)
Cholesterol, Total: 142 mg/dL (ref 100–199)
HDL: 48 mg/dL (ref >39)
LDL Chol Calc (NIH): 80 mg/dL (ref 0–99)
Triglycerides: 70 mg/dL (ref 0–149)
VLDL Cholesterol Cal: 14 mg/dL (ref 5–40)

## 2022-05-06 NOTE — Patient Instructions (Signed)
Medication Instructions:  Your physician has recommended you make the following change in your medication:   RESTART Losartan 50 mg take 1 daily    *If you need a refill on your cardiac medications before your next appointment, please call your pharmacy*   Lab Work: None ordered  If you have labs (blood work) drawn today and your tests are completely normal, you will receive your results only by: MyChart Message (if you have MyChart) OR A paper copy in the mail If you have any lab test that is abnormal or we need to change your treatment, we will call you to review the results.   Testing/Procedures: None ordered   Follow-Up: At [facility], you and your health needs are our priority.  As part of our continuing mission to provide you with exceptional heart care, we have created designated Provider Care Teams.  These Care Teams include your primary Cardiologist (physician) and Advanced Practice Providers (APPs -  Physician Assistants and Nurse Practitioners) who all work together to provide you with the care you need, when you need it.  We recommend signing up for the patient portal called "MyChart".  Sign up information is provided on this After Visit Summary.  MyChart is used to connect with patients for Virtual Visits (Telemedicine).  Patients are able to view lab/test results, encounter notes, upcoming appointments, etc.  Non-urgent messages can be sent to your provider as well.   To learn more about what you can do with MyChart, go to [email or web address].    Your next appointment:   AS SCHEUDLED  The format for your next appointment:   In Person  Provider:   Tereso Newcomer, PA-C         Other Instructions   Important Information About Sugar

## 2022-05-06 NOTE — Progress Notes (Signed)
Cardiology Office Note:    Date:  05/06/2022   ID:  [name] Chroman., DOB 11-26-1945, MRN [id number]  PCP:  [name], FNP   [facility] Providers Cardiologist:  Mertie Moores, MD     Referring MD: [name], FNP   Chief Complaint: dizziness  History of Present Illness:    [name] Morale. is a pleasant 76 y.o. male with a hx of CAD s/p MI in 2002, NSTEMI 2022, HFrEF, HTN, HLD, medication noncompliance, and polio.  History of CAD with remote stenting mid LAD and distal RCA. Cardiac catheterization 11/15/2018 revealed significant single-vessel CAD with occluded rPL branch and AV groove likely representing culprit lesion.  Mild to moderate, nonobstructive CAD involving LAD and LCx, patent stents in mid LAD and distal RCA with mild to moderate ISR.  Moderately reduced LV function.  Aborted PCI to rPL due to inability to cross the lesion with a balloon despite aggressive guide catheter and wire support.  Wiring of the lesion resulted in restoration of TIMI II flow with 99% residual stenosis.  Cardiac catheterization 07/15/2021 revealed unchanged anatomy and left system.  Denovo lesion in distal RCA at the crux as well as severe ISR within distal RCA stent, the PLA which Dr. Saunders Revel attempted to cross 2 years prior was patent with TIMI II-III flow and a 99% stenosis similar to how he left it. DES x 2 with cutting balloon atherectomy and guide liner to Denovo lesion and previously placed RCA stent for high-grade ISR with 2 overlapping stents. Advised to continue DAPT for at least 12 months, if not indefinitely.   He has had a difficult time tolerating medications for several years. He was last seen in our office on 02/11/2022 by Marcelle Overlie, [place] at which time he reported multiple drug intolerances. Was agreeable to restart losartan as had been advised by Dr. Acie Fredrickson at office visit 01/09/22 and to restart rosuvastatin.   He came in today for scheduled lab work and reported  dizziness and requested an appointment. Was placed on my schedule.   He reports on 05/05/22, he got out of bed in the morning to go to bathroom and almost fell down due to dizziness. No visual changes. Was able to use the bathroom and get back in bed. 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 Felt better as the morning went on. Got in recliner at about 12:30 pm and slept for 5 hours which is very unusual. Reports he ate well yesterday. Drinks gatorade, not much water. Drinks a lot of coffee. Sometimes feels dizzy if he stands up too fast but has never had symptoms like he had yesterday. Today, he woke up and felt lightheaded, not as bad as yesterday. Was able to drive himself here. 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 Stops if he gets hot. Removed ticks from torso and leg 3 days ago. In discussion of current medications, he reports he stopped Brilinta 6-8 months ago due to diarrhea. Stopped Entresto prior to that due to diarrhea and elevated potassium. Never started losartan. On hydrocodone for teeth pain because he bites his lip due to his dentition, is working with [employer]. He denies chest pain, shortness of breath, lower extremity edema, fatigue, palpitations, melena, hematuria, hemoptysis, diaphoresis, weakness, presyncope, syncope, orthopnea, and PND. Has a nurse that comes on occasion through his insurance. She reported  low BP last time she was there.  He does not monitor on a consistent basis.  Past Medical History:  Diagnosis Date   Coronary artery disease    Erectile dysfunction    HFrEF (heart failure with reduced ejection fraction) (HCC)    Echocardiogram 07/14/21:  EF 25, global HK, inf HK worse, Gr 2 DD, normal RVSF, mild to mod MR, AV sclerosis w/o AS Echocardiogram 2/23: EF 25-30, global HK, GLS -12.9, normal RVSF, mild to mod MR, AV sclerosis w/o AS    History of hiatal hernia    a lone  time ago   Hyperlipidemia    [place] fracture [facility]) 03/22/2020   Myocardial infarction ([place])    x 2   Polio     Past Surgical History:  Procedure Laterality Date   APPENDECTOMY     CARDIAC CATHETERIZATION     has two stents placed   CERVICAL SPINE SURGERY     c3-4, 4-5,  5-6, 8 screws and plates   CORONARY STENT INTERVENTION N/A 07/15/2021   Procedure: CORONARY STENT INTERVENTION;  Surgeon: Lorretta Harp, MD;  Location: [place] [facility];  Service: Cardiovascular;  Laterality: N/A;   CORONARY STENT PLACEMENT     forein body removal     metal sharp removed from right leg   LEFT HEART CATH AND CORONARY ANGIOGRAPHY N/A 11/15/2018   Procedure: LEFT HEART CATH AND CORONARY ANGIOGRAPHY;  Surgeon: Nelva Bush, MD;  Location: [place] [facility];  Service: Cardiovascular;  Laterality: N/A;   LEFT HEART CATH AND CORONARY ANGIOGRAPHY N/A 07/15/2021   Procedure: LEFT HEART CATH AND CORONARY ANGIOGRAPHY;  Surgeon: Lorretta Harp, MD;  Location: [place] [facility];  Service: Cardiovascular;  Laterality: N/A;   MANDIBULAR HARDWARE REMOVAL Bilateral 04/23/2020   Procedure: MANDIBULAR HARDWARE REMOVAL;  Surgeon: Izora Gala, MD;  Location: [place];  Service: ENT;  Laterality: Bilateral;   ORIF MANDIBULAR FRACTURE N/A 03/14/2020   Procedure: OPEN REDUCTION INTERNAL FIXATION (ORIF) MID  FACE FRACTURE, MANDIBULAR FIXATION MODIFIED ARCH BARS;  Surgeon: Izora Gala, MD;  Location: [place];  Service: ENT;  Laterality: N/A;   SCAR REVISION N/A 04/23/2020   Procedure: SCAR REVISION/ Upper Lip Repair;  Surgeon: Izora Gala, MD;  Location: [place];  Service: ENT;  Laterality: N/A;    Current Medications: Current Meds  Medication Sig   aspirin 81 MG chewable tablet Chew 81 mg by mouth daily.   cholecalciferol (VITAMIN D3) 25 MCG (1000 UNIT) tablet Take 1,000 Units by mouth daily.    HYDROcodone-acetaminophen (NORCO) 7.5-325 MG tablet Take 1 tablet by mouth daily as  needed.   losartan (COZAAR) 50 MG tablet Take 1 tablet (50 mg total) by mouth daily.   metoprolol succinate (TOPROL XL) 25 MG 24 hr tablet Take 0.5 tablets (12.5 mg total) by mouth every evening.   nitroGLYCERIN (NITROSTAT) 0.4 MG SL tablet Place 1 tablet (0.4 mg total) under the tongue every 5 (five) minutes as needed for chest pain.   rosuvastatin (CRESTOR) 20 MG tablet Take 1 tablet (20 mg total) by mouth daily.   tadalafil (CIALIS) 10 MG tablet TAKE 1 TO 2 TABLETS AS DIRECTED AS NEEDED   triamcinolone cream (KENALOG) 0.1 % Apply 1 application topically 2 (two) times daily.   [DISCONTINUED] predniSONE (DELTASONE) 10 MG tablet Take 5 daily for 2 days followed by 4,3,2 and 1 for 2 days each.     Allergies:   Carvedilol, Lipitor [atorvastatin], and Plavix [clopidogrel]  Social History   Socioeconomic History   Marital status: Divorced    Spouse name: Not on file   Number of children: Not on file   Years of education: Not on file   Highest education level: Not on file  Occupational History   Occupation: retired    Comment: farmer  Tobacco Use   Smoking status: Former    Types: Cigarettes   Smokeless tobacco: Never  Vaping Use   Vaping Use: Never used  Substance and Sexual Activity   Alcohol use: Yes    Alcohol/week: 21.0 standard drinks of alcohol    Types: 21 Cans of beer per week    Comment: 2 beers daily   Drug use: No   Sexual activity: Not on file  Other Topics Concern   Not on file  Social History Narrative   Mr. Schuyler has a restraining order against one of his sons.   Social Determinants of Health   Financial Resource Strain: Low Risk  (02/11/2022)   Overall Financial Resource Strain (CARDIA)    Difficulty of Paying Living Expenses: Not very hard  Food Insecurity: Food Insecurity Present (02/11/2022)   Hunger Vital Sign    Worried About Running Out of Food in the Last Year: Sometimes true    Ran Out of Food in the Last Year: Never true  Transportation Needs: No  Transportation Needs (04/29/2022)   PRAPARE - Hydrologist (Medical): No    Lack of Transportation (Non-Medical): No  Physical Activity: Sufficiently Active (02/11/2022)   Exercise Vital Sign    Days of Exercise per Week: 7 days    Minutes of Exercise per Session: 30 min  Stress: Stress Concern Present (02/11/2022)   [place]    Feeling of Stress : To some extent  Social Connections: Unknown (02/11/2022)   Social Connection and Isolation Panel [NHANES]    Frequency of Communication with Friends and Family: More than three times a week    Frequency of Social Gatherings with Friends and Family: More than three times a week    Attends Religious Services: Not on Advertising copywriter or Organizations: Yes    Attends Music therapist: More than 4 times per year    Marital Status: Divorced     Family History: The patient's family history includes Diabetes in his mother; Heart disease in his father and mother.  ROS:   Please see the history of present illness.    + dizziness All other systems reviewed and are negative.  Labs/Other Studies Reviewed:    The following studies were reviewed today:  Echo 02/11/22  1. Left ventricular ejection fraction, by estimation, is 25 to 30%. The  left ventricle has severely decreased function. The left ventricle  demonstrates global hypokinesis. There is mild concentric left ventricular  hypertrophy. Left ventricular diastolic   parameters are consistent with Grade I diastolic dysfunction (impaired  relaxation). The average left ventricular global longitudinal strain is  -14.5 %. The global longitudinal strain is abnormal.   2. Right ventricular systolic function is normal. The right ventricular  size is normal.   3. The mitral valve is normal in structure. No evidence of mitral valve  regurgitation. No evidence of mitral stenosis.    4. The aortic valve is tricuspid. Aortic valve regurgitation is not  visualized. No aortic stenosis is present.   5. The inferior vena cava is normal in size  with <50% respiratory  variability, suggesting right atrial pressure of 8 mmHg.   LHC 07/15/21    Mid LAD-2 lesion is 40% stenosed.   Dist RCA-1 lesion is 90% stenosed.   Mid LAD-1 lesion is 40% stenosed.   Dist RCA-2 lesion is 95% stenosed.   A drug-eluting stent was successfully placed.   Post intervention, there is a 0% residual stenosis.   Post intervention, there is a 0% residual stenosis.   IMPRESSION: Mr. Bur has unchanged anatomy in his left system.  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  The PLA which Dr. Okey Dupre attempted to cross 2 years ago was patent with TIMI I-II flow and a 99% stenosis which is similar to how he left it.  I was able to stent the De novo lesion and stent the previously placed RCA stent for high-grade "ISR" with 2 overlapping drug-eluting stents post dilating the entire segment to 2.83 mm.  This was facilitated with an AL 0.75 guide, Cutting Balloon atherectomy and a guide liner placed in the distal RCA.  Patient tolerated procedure well.  He will need uninterrupted dual antiplatelet therapy for at least 12 months if not indefinitely.  It is unclear whether his LV dysfunction is related to the RCA or true, true and unrelated but nevertheless he will need guideline directed optimal medical therapy for his EF of 25%.  I have communicated the results to Dr. Katrinka Blazing, his attending cardiologist.  He left the lab in stable condition.   LHC 11/15/2018  Conclusions: Significant single-vessel coronary artery disease with occluded rPL branch in the AV groove likely representing the culprit lesion. Mild to moderate, non-obstructive CAD involving the LAD and LCx. Patent stents in the mid LAD and distal RCA with mild to moderate in-stent  restenosis. Moderately reduced left ventricular systolic function (LVEF 35-45%) with basal and mid inferior akinesis and normal left ventricular filling pressure. Aborted PCI to rPL due to inability to cross the lesion with a balloon despite aggressive guide catheter and wire support.  Wiring of the lesion resulted in restoration of TIMI-2 flow with 99% residual stenosis.   Recommendations: Restart heparin infusion 2 hours after deflation of TR band to complete 48-72 hours of IV heparin from the time of admission. Dual antiplatelet therapy with aspirin and ticagrelor for 12 months. Aggressive secondary prevention.   Recent Labs: 01/07/2022: ALT 21; Hemoglobin 16.9; Platelets 163 02/11/2022: BUN 15; Creatinine, Ser 0.88; Potassium 4.4; Sodium 140  Recent Lipid Panel    Component Value Date/Time   CHOL 138 01/07/2022 0934   TRIG 22 01/07/2022 0934   HDL 37 (L) 01/07/2022 0934   CHOLHDL 3.7 01/07/2022 0934   CHOLHDL 3.4 07/13/2021 0303   VLDL 17 07/13/2021 0303   LDLCALC 94 01/07/2022 0934     Risk Assessment/Calculations:       Physical Exam:    VS:  BP (!) 140/82   Pulse 67   Ht 5\' 6"  (1.676 m)   Wt 144 lb 12.8 oz (65.7 kg)   SpO2 98%   BMI 23.37 kg/m     Wt Readings from Last 3 Encounters:  05/06/22 144 lb 12.8 oz (65.7 kg)  04/14/22 144 lb 6.4 oz (65.5 kg)  03/07/22 145 lb (65.8 kg)     GEN:  Thin, well developed in no acute distress HEENT: Normal NECK: No JVD; No carotid bruits CARDIAC: RRR, no murmurs, rubs, gallops RESPIRATORY:  Clear to auscultation without rales, wheezing or rhonchi  ABDOMEN: Soft, non-tender, non-distended MUSCULOSKELETAL:  No edema; No deformity. 2+ pedal pulses, equal bilaterally SKIN: Warm and dry NEUROLOGIC:  Alert and oriented x 3 PSYCHIATRIC:  Normal affect   EKG:  EKG is ordered today.  The ekg ordered today demonstrates sinus rhythm at 67 bpm, occasional PVCs, LAD, no acute change from previous  HYPERTENSION CONTROL Vitals:    05/06/22 0759 05/06/22 0843  BP: (!) 148/84 (!) 140/82    The patient's blood pressure is elevated above target today.  In order to address the patient's elevated BP: A new medication was prescribed today.    Diagnoses:    1. HFrEF (heart failure with reduced ejection fraction) (HCC)   2. Hyperlipidemia LDL goal <70   3. Coronary artery disease involving native coronary artery of native heart without angina pectoris   4. Dizziness   5. Noncompliance with medication regimen   6. Essential hypertension    Assessment and Plan:     Dizziness: Episode of severe dizziness that occurred 05/05/2022 when he got out of bed from sleep. Admits he may have stood up too quickly. Recent history of working outside for several days. Admits he does not drink enough water. Drinks gatorade and coffee, does not eat when working outside. I suspect dizziness and fatigue that occurred on 05/05/2022 2/2 dehydration, vasovagal lightheadedness that occurred with standing up rapidly. HFrEF also likely a factor.  Advised him to continue to monitor and call back to report if symptoms recur. Will review results of complete metabolic panel that was obtained this morning for electrolyte abnormalities that may be contributing. Advised good hydration and nutrition when working outside and to avoid prolonged heat exposure.   CAD without angina: Remote stent RCA 2002, remote stent to LAD.  NSTEMI 06/2021 s/p overlapping DES to RCA (ISR) advised to continue DAPT x 1 year, possibly indefinitely. He stopped Brilinta several months ago due to diarrhea. Denies chest pain, dyspnea, or other symptoms concerning for angina.  No indication for further ischemic evaluation at this time.  Advised him of the risk of being off DAPT.  We discussed adding clopidogrel. He would like to continue aspirin alone at this time for anti-platelet therapy. No bleeding concerns.  Continue aspirin, rosuvastatin, metoprolol.  Chronic HFrEF: LVEF 25 to 30%,  LV global HK, mild LVH, G1 DD, normal RV, no significant valve disease on echo 02/11/22. No improvement in LV function from previous echo 10/2021.  Medication noncompliance has been a factor. He did not tolerate Entresto. Lengthy discussion about the risk of not treating his CHF. Agreeable to start losartan 50 mg daily. Advised him to notify us if he does not tolerate this medication.  Continue metoprolol.  Hypertension: BP remains elevated upon my recheck today.  He reports an occasion of hypotension recently. He does not monitor on a consistent basis. Advised him to start losartan 50 mg daily. Advised fluctuations in BP are normal and to call us if he cannot tolerate losartan rather than just stopping it on his own.  Hyperlipidemia LDL goal < 70: LDL 94 on 01/07/2022.  He is now tolerating rosuvastatin 20 mg. Had fasting lipid panel and CMET this morning per plan from [facility] for lipid management. Continue rosuvastatin.   Medication noncompliance: As noted above management of his CAD and HFrEF have been challenging due to his intolerance of multiple medications. Lengthy discussion about the importance of each medication and compliance with the treatment regimen.  Asked him  to notify us of intolerances by phone rather than just stopping medications. He is agreeable.    Disposition: Keep your follow-up appointment with Richardson Dopp on 07/16/2022  Medication Adjustments/Labs and Tests Ordered: Current medicines are reviewed at length with the patient today.  Concerns regarding medicines are outlined above.  Orders Placed This Encounter  Procedures   EKG 12-Lead   No orders of the defined types were placed in this encounter.   Patient Instructions  Medication Instructions:  Your physician has recommended you make the following change in your medication:   RESTART Losartan 50 mg take 1 daily    *If you need a refill on your cardiac medications before your next appointment, please call your  pharmacy*   Lab Work: None ordered  If you have labs (blood work) drawn today and your tests are completely normal, you will receive your results only by: [place] (if you have MyChart) OR A paper copy in the mail If you have any lab test that is abnormal or we need to change your treatment, we will call you to review the results.   Testing/Procedures: None ordered   Follow-Up: At [facility], you and your health needs are our priority.  As part of our continuing mission to provide you with exceptional heart care, we have created designated Provider Care Teams.  These Care Teams include your primary Cardiologist (physician) and Advanced Practice Providers (APPs -  Physician Assistants and Nurse Practitioners) who all work together to provide you with the care you need, when you need it.  We recommend signing up for the patient portal called "MyChart".  Sign up information is provided on this After Visit Summary.  MyChart is used to connect with patients for Virtual Visits (Telemedicine).  Patients are able to view lab/test results, encounter notes, upcoming appointments, etc.  Non-urgent messages can be sent to your provider as well.   To learn more about what you can do with MyChart, go to [email or web address].    Your next appointment:   AS SCHEUDLED  The format for your next appointment:   In Person  Provider:   Richardson Dopp, PA-C         Other Instructions   Important Information About Sugar         Signed, Emmaline Life, NP  05/06/2022 11:27 AM    [place]

## 2022-05-08 ENCOUNTER — Telehealth: Payer: Self-pay | Admitting: Cardiovascular Disease

## 2022-05-08 NOTE — Telephone Encounter (Signed)
Pt returning call in regards to lab results. Transferred to [facility], RPH-CPP.

## 2022-05-08 NOTE — Telephone Encounter (Signed)
Pt called back. Advised labs look good. Continue current medications. He says he is going to try Brilinta again. If it makes him breath hard, he will stop it.

## 2022-05-13 ENCOUNTER — Ambulatory Visit: Payer: Self-pay | Admitting: *Deleted

## 2022-05-13 NOTE — Patient Outreach (Signed)
  Care Coordination   Follow Up Visit Note   05/13/2022 Name: [name]. MRN: [id number] DOB: 05/08/1946  [name] [name]. is a 76 y.o. year old male who sees [name], FNP for primary care. I spoke with  [name] [name]. by phone today.  What matters to the patients health and wellness today?  Update on dental resources and collaboration with pcp office He has been to [place] [facility] Reports services there would be $16,000 When talking or eating he continues to bite his top inner lip with his "eye"/cuspid teeth causing pain.  Appeal Hearing scheduled for Thursday 05/15/22 with a federal judge advocate lawyer  Visited by [employer] RN recently but he did not request any assistance      Goals Addressed               This Visit's Progress     Patient Stated     manage dental care needs [facility]) (pt-stated)   Not on track     Care Coordination Interventions: Evaluation of current treatment plan related to dental care needs and patient's adherence to plan as established by provider Provided education to patient re: pain management, SHIIP and changing of dental insurance coverage, Discussed plans with patient for ongoing care management follow up and provided patient with direct contact information for care management team Screening for signs and symptoms of depression related to chronic disease state  Assessed social determinant of health barriers Listened while Mr Audi to ventilate and review his concern. Support provided Reviewed the conclusion of the RN CM collaboration with his pcp about his dental concerns Reviewed a list of access to free or reduced cost dental care provided by [facility] care guide, Jeannine. Confirmed he visited the [place] from this list.  He agrees to allow RN CM e-mail the [employer] on [place] on his behalf. This was completed and pt cc on e-mail Discussed future new bcbs plan after consulting an agent Discuss  medicare enrollment period from October 15 to August 21 2022  Shared ideas with patient on possible other options to get beneficial dental services (mouth guards, completing partial parts of the treatments (extraction, implants) in stages Encourage him to discuss possible assistance from his [place] visiting nurse related to a coverage medical director review  Barriers: reports limited dental insurance coverage not covering over $3000      COMPLETED: Re establish pcp services (pt-stated)   On track     Will call [place] to re-establish care there. Goal met 05/13/22 Remains with pcp sees Hendricks Limes at [facility] [facility]        SDOH assessments and interventions completed:  Yes  SDOH Interventions Today    Flowsheet Row Most Recent Value  SDOH Interventions   Financial Strain Interventions Intervention Not Indicated  Stress Interventions Patient Refused  [related to dental issues, in appeal process]  Transportation Interventions Intervention Not Indicated        Care Coordination Interventions Activated:  Yes  Care Coordination Interventions:  Yes, provided   Follow up plan: Follow up call scheduled for 06/03/22 1 pm     Encounter Outcome:  Pt. Visit Completed   Loula Marcella L. Lavina Hamman, RN, BSN, [place] Coordinator Office number [phone]

## 2022-05-13 NOTE — Patient Instructions (Addendum)
Visit Information  Thank you for taking time to visit with me today. Please don't hesitate to contact me if I can be of assistance to you.  Mr Going [employer] call nurse can be reach by calling [phone]  Mr Machuca here is the  resources provided by [facility] care manager assistant [place] Access to free for reduced cost dental care is not readily available. Public low cost dental clinics are scarce in [place]. [employer] have two mobile dental units which are used by churches and other organizations to sponsor free dental clinics. These mobile units are used about 180 days each year throughout [place]. These clinics are available to patients who do not have dental insurance and cannot otherwise afford to see a dentist. The services provided are limited to restorations (fillings) and extractions. The churches that sponsor these clinics determine their own target patient group; not all clinics are open to the general public.  Listed below are links to organizations that offer free or reduced cost dental care:   [place]: [place] - [email or web address].[place]: [facility] - [email or web address].br  [place]: [employer] - FindDrives.pl  [place] Locations: [place], [place], [place], [place], [place], [place], [place] - [email or web address]  Dental cleanings may be available at your local community college for a small fee. In the dental hygiene program, patients are needed for student practice.  To find out if there is a church or organization in your area sponsoring the [employer] [facility] [employer], email: [email or web address]. Be sure to include the city and county in which you need services. Not all of the clinics are open to the general public. The sponsoring  church/organization enlists local dentists to volunteer aboard the mobile units.  Please contact us if we can serve you and help you or your church get involved in missions.  [address], [place], [place] [address] [place], [place]  [phone] ext.[phone] Ammie Dalton  Following are the goals we discussed today:   Goals Addressed               This Visit's Progress     Patient Stated     manage dental care needs [facility]) (pt-stated)   Not on track     Care Coordination Interventions: Evaluation of current treatment plan related to dental care needs and patient's adherence to plan as established by provider Provided education to patient re: pain management, SHIIP and changing of dental insurance coverage, Discussed plans with patient for ongoing care management follow up and provided patient with direct contact information for care management team Screening for signs and symptoms of depression related to chronic disease state  Assessed social determinant of health barriers Listened while Mr [place] to ventilate and review his concern. Support provided Reviewed the conclusion of the RN CM collaboration with his pcp about his dental concerns Reviewed a list of access to free or reduced cost dental care provided by [facility] care guide, Jeannine. Confirmed he visited the [place] from this list.  He agrees to allow RN CM e-mail the [employer] on [place] on his behalf Barriers: reports limited dental insurance coverage      COMPLETED: Re establish pcp services (pt-stated)   On track     Will call [place]  to re-establish care there. Goal met 05/13/22 Remains with pcp sees Hendricks Limes at [place] [facility]        Our next appointment is by telephone on 06/03/22 at 1 pm   Please call the care guide team at [phone] if you need to cancel or reschedule your appointment.   If you are  experiencing a Mental Health or [place] or need someone to talk to, please call the Suicide and Crisis Lifeline: 988 call the [place] National Suicide Prevention Lifeline: 4580285144 or TTY: 507-780-2360 TTY 639-872-6003) to talk to a trained counselor call 1-800-273-TALK (toll free, 24 hour hotline) call the [facility]: 812-193-4522 call 911   Patient verbalizes understanding of instructions and care plan provided today and agrees to view in [place]. Active MyChart status and patient understanding of how to access instructions and care plan via MyChart confirmed with patient.     The patient has been provided with contact information for the care management team and has been advised to call with any health related questions or concerns.   [place] Lavina Hamman, RN, BSN, [place] Coordinator Office number [phone]

## 2022-05-23 ENCOUNTER — Ambulatory Visit (INDEPENDENT_AMBULATORY_CARE_PROVIDER_SITE_OTHER): Payer: Medicare Other | Admitting: Nurse Practitioner

## 2022-05-23 ENCOUNTER — Encounter: Payer: Self-pay | Admitting: Nurse Practitioner

## 2022-05-23 VITALS — BP 134/85 | HR 71 | Temp 97.8°F | Ht 66.0 in | Wt 146.0 lb

## 2022-05-23 DIAGNOSIS — K047 Periapical abscess without sinus: Secondary | ICD-10-CM | POA: Diagnosis not present

## 2022-05-23 MED ORDER — DOXYCYCLINE HYCLATE 100 MG PO TABS: 100.0000 mg | ORAL_TABLET | Freq: Two times a day (BID) | ORAL | 0 refills | Status: DC

## 2022-05-23 NOTE — Patient Instructions (Signed)
Dental Abscess  A dental abscess is an area of pus in or around a tooth. It comes from an infection. It can cause pain and other symptoms. Treatment will help with symptoms and prevent the infection from spreading. What are the causes? This condition is caused by an infection in or around the tooth. This can be from: Very bad tooth decay (cavities). A bad injury to the tooth, such as a broken or chipped tooth. What increases the risk? The risk to get an abscess is higher in males. It is also more likely in people who: Have dental decay. Have very bad gum disease. Eat sugary snacks between meals. Use tobacco. Have diabetes. Have a weak disease-fighting system (immune system). Do not brush their teeth regularly. What are the signs or symptoms? Some mild symptoms are: Tenderness. Bad breath. Fever. A sharp, sour taste in the mouth. Pain in and around the infected tooth. Worse symptoms of this condition include: Swollen neck glands. Chills. Pus draining around the tooth. Swelling and redness around the tooth, the mouth, or the face. Very bad pain in and around the tooth. The worst symptoms can include: Difficulty swallowing. Difficulty opening your mouth. Feeling like you may vomit or vomiting. How is this treated? This is treated by getting rid of the infection. Your dentist will discuss ways to do this, including: Antibiotic medicines. Antibacterial mouth rinse. An incision in the abscess to drain out the pus. A root canal. Removing the tooth. Follow these instructions at home: Medicines Take over-the-counter and prescription medicines only as told by your dentist. If you were prescribed an antibiotic medicine, take it as told by your dentist. Do not stop taking it even if you start to feel better. If you were prescribed a gel that has numbing medicine in it, use it exactly as told. Ask your dentist if you should avoid driving or using machines while you are taking your  medicine. General instructions Rinse your mouth often with salt water. To make salt water, dissolve -1 tsp (3-6 g) of salt in 1 cup (237 mL) of warm water. Eat a soft diet while your mouth is healing. Drink enough fluid to keep your pee (urine) pale yellow. Do not apply heat to the outside of your mouth. Do not smoke or use any products that contain nicotine or tobacco. If you need help quitting, ask your dentist. Keep all follow-up visits. Prevent an abscess Brush your teeth every morning and every night. Use fluoride toothpaste. Floss your teeth each day. Get dental cleanings as often as told by your dentist. Think about getting dental sealant put on teeth that have deep holes (decay). Drink water that has fluoride in it. Most tap water has fluoride. Check the label on bottled water to see if it has fluoride in it. Drink water instead of sugary drinks. Eat healthy meals and snacks. Wear a mouth guard or face shield when you play sports. Contact a doctor if: Your pain is worse and medicine does not help. Get help right away if: You have a fever or chills. Your symptoms suddenly get worse. You have a very bad headache. You have problems breathing or swallowing. You have trouble opening your mouth. You have swelling in your neck or close to your eye. These symptoms may be an emergency. Get help right away. Call your local emergency services (911 in the U.S.). Do not wait to see if the symptoms will go away. Do not drive yourself to the hospital. Summary A dental abscess   is an area of pus in or around a tooth. It is caused by an infection. Treatment will help with symptoms and prevent the infection from spreading. Take over-the-counter and prescription medicines only as told by your dentist. To prevent an abscess, take good care of your teeth. Brush your teeth every morning and night. Use floss every day. Get dental cleanings as often as told by your dentist. This information is  not intended to replace advice given to you by your health care provider. Make sure you discuss any questions you have with your health care provider. Document Revised: 11/08/2020 Document Reviewed: 11/08/2020 Elsevier Patient Education  2023 Elsevier Inc.  
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
## 2022-06-03 ENCOUNTER — Ambulatory Visit: Payer: Self-pay | Admitting: *Deleted

## 2022-06-03 NOTE — Patient Outreach (Signed)
  Care Coordination   Follow Up Visit Note   06/03/2022 Name: [name] [name]. MRN: [id number] DOB: 1945/10/23  [name] [name]. is a 76 y.o. year old male who sees [name], FNP for primary care. I spoke with  [name] [name]. by phone today.  What matters to the patients health and wellness today?  Completed the federal hearing and still pending a determination letter in the mail. He was able to express his concerns about his lack of food intake as a medical necessity He reports he was not offered an option for his medical issues by his insurance representatives   He confirms his treatment offered by pcp staff on 05/23/22 helped resolved his oral issues  He has preferences to be seen by pcp office NPs  He reports frustration and coping concerns with having to deal with this for about 2 years     06/03/2022    2:23 PM  Depression screen PHQ 2/9  Decreased Interest 1  Down, Depressed, Hopeless 1  PHQ - 2 Score 2  Altered sleeping 0  Tired, decreased energy 0  Change in appetite 2  Feeling bad or failure about yourself  0  Trouble concentrating 1  Moving slowly or fidgety/restless 0  Suicidal thoughts 0  PHQ-9 Score 5  Difficult doing work/chores Somewhat difficult   He agrees to a [facility] SW referral for coping resources    Reviewed upcoming pcp visits  He voices issues with frustration coping with this concern continues to have chewing issues     Goals Addressed               This Visit's Progress     Patient Stated     manage dental care needs [facility]) (pt-stated)   Not on track     Care Coordination Interventions: Reviewed scheduled/upcoming provider appointments including 06/2022 pcp visit with NP, [facility] Social Work referral for coping skills resources as patient continues to voice frustrations with insurance appeals for oral medical care  Discussed plans with patient for ongoing care management follow up and provided patient with direct contact  information for care management team Screening for signs and symptoms of depression related to chronic disease state  Assessed social determinant of health barriers Listened while [name] [name] to ventilate and review his concern with the recent federal hearing. Support provided Discussed future new bcbs plan after consulting an agent  Barriers: reports limited dental insurance coverage not covering over $3000        SDOH assessments and interventions completed:  Yes  SDOH Interventions Today    Flowsheet Row Most Recent Value  SDOH Interventions   Transportation Interventions Intervention Not Indicated  Utilities Interventions Intervention Not Indicated  Depression Interventions/Treatment  --  [referral to [facility] SW for coping resources]  Social Connections Interventions Intervention Not Indicated        Care Coordination Interventions Activated:  Yes  Care Coordination Interventions:  Yes, provided   Follow up plan: Follow up call scheduled for 06/24/22 1 pm     Encounter Outcome:  Pt. Visit Completed   [name] Gunkel L. Lavina Hamman, RN, BSN, [place] Coordinator Office number [phone]

## 2022-06-03 NOTE — Patient Instructions (Signed)
Visit Information  Thank you for taking time to visit with me today. Please don't hesitate to contact me if I can be of assistance to you.   Following are the goals we discussed today:   Goals Addressed               This Visit's Progress     Patient Stated     manage dental care needs [facility]) (pt-stated)   Not on track     Care Coordination Interventions: Reviewed scheduled/upcoming provider appointments including 06/2022 pcp visit with NP, [facility] Social Work referral for coping skills resources as patient continues to voice frustrations with insurance appeals for oral medical care  Discussed plans with patient for ongoing care management follow up and provided patient with direct contact information for care management team Screening for signs and symptoms of depression related to chronic disease state  Assessed social determinant of health barriers Listened while Mr Sek to ventilate and review his concern with the recent federal hearing. Support provided Discussed future new bcbs plan after consulting an agent  Barriers: reports limited dental insurance coverage not covering over $3000        Our next appointment is by telephone on 06/24/22 at 1 pm  Please call the care guide team at [phone] if you need to cancel or reschedule your appointment.   If you are experiencing a Mental Health or [place] or need someone to talk to, please call the Suicide and Crisis Lifeline: 988 call the [place] National Suicide Prevention Lifeline: (803)118-2558 or TTY: 817-068-3077 TTY (303)637-2239) to talk to a trained counselor call 1-800-273-TALK (toll free, 24 hour hotline) call the [facility]: 9307317023 call 911   Patient verbalizes understanding of instructions and care plan provided today and agrees to view in [place]. Active MyChart status and patient understanding of how to access instructions and care plan via MyChart confirmed with patient.      The patient has been provided with contact information for the care management team and has been advised to call with any health related questions or concerns.   [place] Lavina Hamman, RN, BSN, [place] Coordinator Office number [phone]

## 2022-06-04 ENCOUNTER — Telehealth: Payer: Self-pay

## 2022-06-04 NOTE — Chronic Care Management (AMB) (Signed)
  Care Coordination  Outreach Note  06/04/2022 Name: [name] [name]. MRN: [id number] DOB: 1945/12/29   Care Coordination Outreach Attempts: An unsuccessful telephone outreach was attempted today to offer the patient information about available care coordination services as a benefit of their health plan.   Follow Up Plan:  Additional outreach attempts will be made to offer the patient care coordination information and services.   Encounter Outcome:  No Answer  Sig Noreene Larsson, [place], [place] [address] Direct Dial: [phone] [email or web address]

## 2022-06-05 ENCOUNTER — Telehealth: Payer: Self-pay

## 2022-06-05 ENCOUNTER — Encounter: Payer: Self-pay | Admitting: *Deleted

## 2022-06-05 ENCOUNTER — Ambulatory Visit: Payer: Self-pay | Admitting: *Deleted

## 2022-06-05 NOTE — Chronic Care Management (AMB) (Signed)
  Care Coordination   Note   06/05/2022 Name: Deshane Cotroneo. MRN: [id number] DOB: 02-01-1946  [name]. is a 76 y.o. year old male who sees [name], FNP for primary care. I reached out to [name]. by phone today to offer care coordination services.  Mr. Touchet was given information about Care Coordination services today including:   The Care Coordination services include support from the care team which includes your Nurse Coordinator, Clinical Social Worker, or Pharmacist.  The Care Coordination team is here to help remove barriers to the health concerns and goals most important to you. Care Coordination services are voluntary, and the patient may decline or stop services at any time by request to their care team member.   Care Coordination Consent Status: Patient agreed to services and verbal consent obtained.   Follow up plan:  Telephone appointment with care coordination team member scheduled for:  06/05/2022  Encounter Outcome:  Pt. Scheduled  Noreene Larsson, [place], [place] [address] Direct Dial: [phone] [email or web address]

## 2022-06-07 NOTE — Patient Instructions (Signed)
Visit Information  Thank you for taking time to visit with me today. Please don't hesitate to contact me if I can be of assistance to you.   Following are the goals we discussed today:   Goals Addressed               This Visit's Progress     Assist with Federal Appeals Process to Obtain Financial Coverage for Dental Implants. (pt-stated)   On track     Care Coordination Interventions:  Assessed Social Determinant of Health Barriers. Provided Education to Patient About Advanced Directives Completion. Discussed Plans with Patient for Ongoing Care Management Follow Up. Provided Patient with Direct Contact Information for Care Management Team. Screened for Signs and Symptoms of Depression Related to Chronic Disease State.  IRJ1/OAC1 Depression Screen Completed & Results Reviewed with Patient.  Suicidal Ideation/Homicidal Ideation Assessed - None Present. Solution-Focused Strategies Employed. Active Listening/Reflection Utilized.  Emotional Support Provided. Verbalization of Feelings Encouraged.  Problem Solving/Task-Centered Solutions Developed.   Quality of Sleep Assessed & Sleep Hygiene Techniques Promoted. Letter of Medical Necessity, Generated by Primary Care Provider, Family Nurse Practitioner, Hendricks Limes, Faxed to Office of Medicare and Appeals, for Review and Consideration.  [facility] [place] [facility], Agreed to Perform All Cosmetic and Surgerical Procedures to Improve Functionality and Quality of Life. Continue to Await Verdict Letter in the Mail, from [employer] on 04/25/2022, Against the Office of Medicare and Appeals.    [employer] Denied Dental Coverage Claims on 5 Different Occasions. CSW Collaboration with Eilene Ghazi, Lucina Mellow with the [place] Department of Health and [employer] (# [phone]), to [employer] on [employer].          COMPLETED: manage  dental care needs [facility]) (pt-stated)        Care Coordination Interventions: Reviewed scheduled/upcoming provider appointments including 06/2022 pcp visit with NP, Rakes Social Work referral for coping skills resources as patient continues to voice frustrations with insurance appeals for oral medical care  Discussed plans with patient for ongoing care management follow up and provided patient with direct contact information for care management team Screening for signs and symptoms of depression related to chronic disease state  Assessed social determinant of health barriers Listened while Mr Kitt to ventilate and review his concern with the recent federal hearing. Support provided Discussed future new bcbs plan after consulting an agent  Barriers: reports limited dental insurance coverage not covering over $3000        Our next appointment is by telephone on 06/12/2022 at 1:15 pm.  Please call the care guide team at [phone] if you need to cancel or reschedule your appointment.   If you are experiencing a Mental Health or [place] or need someone to talk to, please call the Suicide and Crisis Lifeline: 988 call the [place] National Suicide Prevention Lifeline: 807-579-1179 or TTY: 6296615338 TTY (470) 174-3035) to talk to a trained counselor call 1-800-273-TALK (toll free, 24 hour hotline) go to [facility] [address], [place] [phone]) call the [place]: [phone] call 911  Patient verbalizes understanding of instructions and care plan provided today and agrees to view in [place]. Active MyChart status and patient understanding of how to access instructions and care plan via MyChart confirmed with patient.     Telephone follow up appointment with care management team member scheduled for:  06/12/2022 at 1:15 pm.  Di Kindle  Samanatha Brammer, BSW, MSW, [employer]  Licensed Engineer, petroleum Health System  Mailing [place] [address], [place], [place] [address] Physical Address-[address] [place], [place] [address] Toll Free Main # [phone] Fax # [phone] Cell # [phone] [email or web address]

## 2022-06-07 NOTE — Patient Outreach (Signed)
Care Coordination   Initial Visit Note   06/07/2022  Name: [name] [name]. MRN: [id number] DOB: [birth year]  [name] [name]. is a 76 y.o. year old male who sees [name] Brooklyn, FNP for primary care. I spoke with [name] [name]. by phone today.  What matters to the patients health and wellness today?  Assist with [employer] Process to [employer] Coverage for Dental Implants.    Goals Addressed               This Visit's Progress     Assist with Federal Appeals Process to Obtain Financial Coverage for Dental Implants. (pt-stated)   On track     Care Coordination Interventions:  Assessed Social Determinant of Health Barriers. Provided Education to Patient About Advanced Directives Completion. Discussed Plans with Patient for Ongoing Care Management Follow Up. Provided Patient with Direct Contact Information for Care Management Team. Screened for Signs and Symptoms of Depression Related to Chronic Disease State.  [id number] Depression Screen Completed & Results Reviewed with Patient.  Suicidal Ideation/Homicidal Ideation Assessed - None Present. Solution-Focused Strategies Employed. Active Listening/Reflection Utilized.  Emotional Support Provided. Verbalization of Feelings Encouraged.  Problem Solving/Task-Centered Solutions Developed.   Quality of Sleep Assessed & Sleep Hygiene Techniques Promoted. Letter of Medical Necessity, Generated by Primary Care Provider, Family Nurse Practitioner, [name] [name], Faxed to Office of Medicare and Appeals, for Review and Consideration.  [facility] [place] [facility], Agreed to Perform All Cosmetic and Surgerical Procedures to Improve Functionality and Quality of Life. Continue to Await Verdict Letter in the Mail, from [employer] on 04/25/2022, Against the Office of Medicare and Appeals.    [employer] Denied Dental Coverage Claims on 5 Different  Occasions. CSW Collaboration with [name] [name], [name] [name] with the [place] Department of Health and [employer] (# [phone]), to [employer] on [employer].          COMPLETED: manage dental care needs [facility]) (pt-stated)        Care Coordination Interventions: Reviewed scheduled/upcoming provider appointments including 06/2022 pcp visit with NP, [name] [name] Social Work referral for coping skills resources as patient continues to voice frustrations with insurance appeals for oral medical care  Discussed plans with patient for ongoing care management follow up and provided patient with direct contact information for care management team Screening for signs and symptoms of depression related to chronic disease state  Assessed social determinant of health barriers Listened while [name] [name] to ventilate and review his concern with the recent federal hearing. Support provided Discussed future new bcbs plan after consulting an agent  Barriers: reports limited dental insurance coverage not covering over $3000        SDOH assessments and interventions completed:  Yes.  SDOH Interventions Today    Flowsheet Row Most Recent Value  SDOH Interventions   Food Insecurity Interventions Intervention Not Indicated  Housing Interventions Intervention Not Indicated  Transportation Interventions Intervention Not Indicated  Utilities Interventions Intervention Not Indicated  Alcohol Usage Interventions Intervention Not Indicated (Score <7)  Financial Strain Interventions Intervention Not Indicated  Physical Activity Interventions Intervention Not Indicated  Stress Interventions Provide Counseling, Offered [employer] Resources, Patient Refused  Social Connections Interventions Intervention Not Indicated        Care Coordination Interventions Activated:  Yes.   Care Coordination Interventions:  Yes, provided.   Follow up plan: Follow up call scheduled for  06/12/2022 at  1:15 pm.  Encounter Outcome:  Pt. Visit Completed.   [name] [name], BSW, MSW, LCSW  Licensed Education officer, environmental Health System  Mailing [place] [address] [place], [place] [address] Physical Address-[address] [place], [place] [address] Toll Free Main # [phone] Fax # [phone] Cell # [phone] [name]

## 2022-06-12 ENCOUNTER — Encounter: Payer: Self-pay | Admitting: *Deleted

## 2022-06-12 ENCOUNTER — Ambulatory Visit: Payer: Self-pay | Admitting: *Deleted

## 2022-06-12 NOTE — Patient Outreach (Addendum)
  Care Coordination   Follow Up Visit Note   06/12/2022  Name: [name] [name]. MRN: [id number] DOB: [birth year]  [name] [name]. is a 75 y.o. year old male who sees [name], FNP for primary care. I spoke with [name] [name]. by phone today.  What matters to the patients health and wellness today?   Assist with Aetna Process to [employer] Coverage for Dental Implants.   Goals Addressed               This Visit's Progress     Assist with Federal Appeals Process to Obtain Financial Coverage for Dental Implants. (pt-stated)   On track     Care Coordination Interventions:  Active Listening/Reflection Utilized.  Emotional Support Provided. Verbalization of Feelings Encouraged.  Problem Solving/Task-Centered Solutions Employed.  Continue to Await Verdict Letter in the Mail, from [employer] on 05/15/2022, with the [place] of [employer] and [employer].   ~ [employer] Denied Dental Coverage Claims on 5 Different Occasions. CSW Continued Collaboration with Eilene Ghazi, Lucina Mellow with the [place] of Health and [employer] (# [phone]), to [employer] on [employer].   Curator # [id number].          SDOH assessments and interventions completed:  Yes.  Care Coordination Interventions Activated:  Yes.   Care Coordination Interventions:  Yes, provided.   Follow up plan: Follow up call scheduled for 06/17/2022 at 2:15 pm.  Encounter Outcome:  Pt. Visit Completed.   Nat Christen, BSW, MSW, LCSW  Licensed Education officer, environmental Health System  Mailing [place] [address] [place], [place] [address] Physical Address-[address], [place], [place] [address] Toll Free Main # [phone] Fax # [phone] Cell #  [phone] [email or web address]

## 2022-06-12 NOTE — Patient Instructions (Signed)
Visit Information  Thank you for taking time to visit with me today. Please don't hesitate to contact me if I can be of assistance to you.   Following are the goals we discussed today:   Goals Addressed               This Visit's Progress     Assist with Federal Appeals Process to Obtain Financial Coverage for Dental Implants. (pt-stated)   On track     Care Coordination Interventions:  Active Listening/Reflection Utilized.  Emotional Support Provided. Verbalization of Feelings Encouraged.  Problem Solving/Task-Centered Solutions Employed.  Continue to Await Verdict Letter in the Mail, from [employer] on 05/15/2022, with the [place] of [employer] and [employer].   ~ [employer] Denied Dental Coverage Claims on 5 Different Occasions. CSW Continued Collaboration with Eilene Ghazi, Lucina Mellow with the [place] of Health and [employer] (# [phone]), to [employer] on [employer].   Curator # [id number].          Our next appointment is by telephone on 06/17/2022 at 2:15 pm.  Please call the care guide team at [phone] if you need to cancel or reschedule your appointment.   If you are experiencing a Mental Health or [place] or need someone to talk to, please call the Suicide and Crisis Lifeline: 988 call the [place] National Suicide Prevention Lifeline: 401-052-7001 or TTY: 639 776 1666 TTY 2791865946) to talk to a trained counselor call 1-800-273-TALK (toll free, 24 hour hotline) go to [facility] [address] [place] [phone]) call the [place]: [phone] call 911  Patient verbalizes understanding of instructions and care plan provided today and agrees to view in [place]. Active MyChart status and patient understanding of how to access  instructions and care plan via MyChart confirmed with patient.     Telephone follow up appointment with care management team member scheduled for:  06/17/2022 at 2:15 pm.  Nat Christen, BSW, MSW, [place]  Licensed Clinical Social Worker  [place]  Mailing [place]. [address] [place], [place] [address] Physical Address-[address] [place], [place] [address] Toll Free Main # [phone] Fax # [phone] Cell # [phone] [email or web address]

## 2022-06-17 ENCOUNTER — Encounter: Payer: Self-pay | Admitting: *Deleted

## 2022-06-17 ENCOUNTER — Ambulatory Visit: Payer: Self-pay | Admitting: *Deleted

## 2022-06-17 NOTE — Patient Instructions (Signed)
Visit Information  Thank you for taking time to visit with me today. Please don't hesitate to contact me if I can be of assistance to you.   Following are the goals we discussed today:   Goals Addressed               This Visit's Progress     Assist with Federal Appeals Process to Obtain Financial Coverage for Dental Implants. (pt-stated)   On track     Care Coordination Interventions:  Active Listening/Reflection Utilized.  Emotional Support Provided. Verbalization of Feelings Encouraged.  Continue to Await Verdict Letter in the Mail, from [employer] on 05/15/2022, with the [place] of [employer] and [employer].   ~ [employer] Denied Dental Coverage Claims on 5 Different Occasions. CSW Continued Collaboration with Eilene Ghazi, Lucina Mellow with the [place] of Health and [employer] (# [phone]), to [employer] on [employer].   ~ HIPAA Compliant Messages Left on Voicemail, as CSW Continues to Await a Return Call. Patient's Retail banker # [id number].          Our next appointment is by telephone on 06/24/2022 at 1:30 pm.  Please call the care guide team at [phone] if you need to cancel or reschedule your appointment.   If you are experiencing a Mental Health or [place] or need someone to talk to, please call the Suicide and Crisis Lifeline: 988 call the [place] National Suicide Prevention Lifeline: 204-389-8296 or TTY: (309) 441-5588 TTY 780 041 3287) to talk to a trained counselor call 1-800-273-TALK (toll free, 24 hour hotline) go to [facility] [address], [place] [phone]) call the [place]: [phone] call 911  Patient verbalizes understanding of instructions and care plan provided today and agrees to view in [place]. Active MyChart status and  patient understanding of how to access instructions and care plan via MyChart confirmed with patient.     Telephone follow up appointment with care management team member scheduled for:  06/24/2022 at 1:30 pm.  Nat Christen, BSW, MSW, [place]  Licensed Clinical Social Worker  [place]  Mailing [place]. [address], [place], [place] [address] Physical Address-[address] [place], [place] [address] Toll Free Main # [phone] Fax # [phone] Cell # [phone] [email or web address]

## 2022-06-17 NOTE — Patient Outreach (Signed)
  Care Coordination   Follow Up Visit Note   06/17/2022  Name: [name] [name]. MRN: [id number] DOB: 1946/06/08  [name]. is a 76 y.o. year old male who sees [name], FNP for primary care. I spoke with [name]. by phone today.  What matters to the patients health and wellness today?  Assist with [employer]etna Process to [employer] Coverage for Dental Implants.   Goals Addressed               This Visit's Progress     Assist with Federal Appeals Process to Obtain Financial Coverage for Dental Implants. (pt-stated)   On track     Care Coordination Interventions:  Active Listening/Reflection Utilized.  Emotional Support Provided. Verbalization of Feelings Encouraged.  Continue to Await Verdict Letter in the Mail, from [employer] on 05/15/2022, with the [place] of [employer] and [employer].   ~ [employer] Denied Dental Coverage Claims on 5 Different Occasions. CSW Continued Collaboration with Eilene Ghazi, [place] Mellow with the [place] of Health and [employer] (# [phone]), to [employer] on [employer].   ~ HIPAA Compliant Messages Left on Voicemail, as CSW Continues to Await a Return Call. Patient's Retail banker # [id number].        Care Coordination Interventions Activated:  Yes.   Care Coordination Interventions:  Yes, provided.   Follow up plan:  CSW will contact patient by phone on 06/24/2022 at 1:30 pm.  Encounter Outcome:  Pt. Visit Completed.   Nat Christen, BSW, MSW, LCSW  Licensed Education officer, environmental Health System  Mailing [place] [address], [place], [place] [address] Physical Address-[address] [place],  [address] Toll Free Main # [phone] Fax # [phone] Cell # [phone] [email or web address]

## 2022-06-24 ENCOUNTER — Ambulatory Visit: Payer: Self-pay | Admitting: *Deleted

## 2022-06-24 NOTE — Patient Instructions (Signed)
Visit Information  Thank you for taking time to visit with me today. Please don't hesitate to contact me if I can be of assistance to you.   Following are the goals we discussed today:   Goals Addressed               This Visit's Progress     Patient Stated     manage dental care needs [facility]) (pt-stated)   Not on track     Care Coordination Interventions: Discussed plans with patient for ongoing care management follow up and provided patient with direct contact information for care management team Screening for signs and symptoms of depression related to chronic disease state  Assessed social determinant of health barriers Listened while Mr Demartini to ventilate and review his concerns Support provided Assessed for any update on his Federal appeal for oral service insurance coverage Confirms he continues to work with [facility], [place] plus outreaches to the federal judge to attempt to get a determination from the 05/15/22 hearing  Confirm he has not received a letter addressing this appeal yet Assessed for cardiac worsening symptoms after he was noted to have a right upper wisdom tooth dental abscess again and started on antibiotics Confirm he has a new concern with pain when brushing his teeth Encouraged seeing another dentist  Barriers: reports limited dental insurance coverage not covering over $3000        Our next appointment is by telephone on 07/25/22 at 1 pm  Please call the care guide team at [phone] if you need to cancel or reschedule your appointment.   If you are experiencing a Mental Health or [place] or need someone to talk to, please call the Suicide and Crisis Lifeline: 988 call the [place] National Suicide Prevention Lifeline: 858-365-1314 or TTY: 6283292547 TTY (212)100-0605) to talk to a trained counselor call 1-800-273-TALK (toll free, 24 hour hotline) call the [facility]: [phone] call 911   Patient verbalizes  understanding of instructions and care plan provided today and agrees to view in [place]. Active MyChart status and patient understanding of how to access instructions and care plan via MyChart confirmed with patient.     The patient has been provided with contact information for the care management team and has been advised to call with any health related questions or concerns.    Miri Jose L. [place] Hamman, RN, BSN, [place] Coordinator Office number [phone]

## 2022-06-24 NOTE — Patient Outreach (Signed)
  Care Coordination   Follow Up Visit Note   06/24/2022 Name: [name] [name]. MRN: [id number] DOB: 06-Dec-1945  [name] [name]. is a 76 y.o. year old male who sees [name], FNP for primary care. I spoke with  [name] [name]. by phone today.  What matters to the patients health and wellness today?  Mr Stemen has not received a letter or outreach from the Federal appeal since the 05/15/22 hearing related to his insurance claim to assist with needed mouth/jaw procedure  He states "something has to be done He plans to get another MD to look at his mouth (medical necessity) and records from the [facility] dental to send to his insurance carrier (update)   Mr Nhan confirms his mouth hurts when he brushes his teeth (this is new)  He continues with jaw pain, difficulty swallowing, facial pain  He was noted on his pcp office visit 05/23/22 to have a right upper wisdom tooth dental abscess. He has again been started on further antibiotic, Doxycycline 100 mg twice a day    Denies cardiac worsening conditions with his hypertension and congestive Heart Failure (CHF) He confirms taking his  medicine as ordered and seeing his cardiologist this year.  Last office visit blood pressure of 134/85    Goals Addressed               This Visit's Progress     Patient Stated     manage dental care needs [facility]) (pt-stated)   Not on track     Care Coordination Interventions: Discussed plans with patient for ongoing care management follow up and provided patient with direct contact information for care management team Screening for signs and symptoms of depression related to chronic disease state  Assessed social determinant of health barriers Listened while Mr Toya to ventilate and review his concerns Support provided Assessed for any update on his Federal appeal for oral service insurance coverage Confirms he continues to work with [facility], [place] plus outreaches to the federal judge to attempt  to get a determination from the 05/15/22 hearing  Confirm he has not received a letter addressing this appeal yet Assessed for cardiac worsening symptoms after he was noted to have a right upper wisdom tooth dental abscess again and started on antibiotics Confirm he has a new concern with pain when brushing his teeth Encouraged seeing another dentist  Barriers: reports limited dental insurance coverage not covering over $3000        SDOH assessments and interventions completed:  Yes  SDOH Interventions Today    Flowsheet Row Most Recent Value  SDOH Interventions   Food Insecurity Interventions Intervention Not Indicated  Housing Interventions Intervention Not Indicated  Transportation Interventions Intervention Not Indicated  Utilities Interventions Intervention Not Indicated  Depression Interventions/Treatment  Counseling  Financial Strain Interventions Intervention Not Indicated        Care Coordination Interventions Activated:  Yes  Care Coordination Interventions:  Yes, provided   Follow up plan: Follow up call scheduled for 07/25/22     Encounter Outcome:  Pt. Visit Completed   Chancy Smigiel L. [place] Hamman, RN, BSN, [place] Coordinator Office number [phone]

## 2022-07-01 ENCOUNTER — Encounter: Payer: Self-pay | Admitting: *Deleted

## 2022-07-01 NOTE — Patient Outreach (Signed)
Care Coordination   Follow Up Visit Note   07/01/2022  Name: [name] [name]. MRN: [id number] DOB: 03/26/1946  [name] [name]. is a 76 y.o. year old male who sees [name], FNP for primary care. I spoke with [name] [name]. by phone today.  What matters to the patients health and wellness today?   Assist with [employer] Process to [employer] Coverage for Dental Implants.   Goals Addressed               This Visit's Progress     Assist with Federal Appeals Process to Obtain Financial Coverage for Dental Implants. (pt-stated)   On track     Care Coordination Interventions:  Active Listening/Reflection Utilized.  Emotional Support Provided. Verbalization of Feelings and Frustration Encouraged.  Continue to Await Verdict Letter in the Mail, from [employer] on 05/15/2022, with the [place] of [employer] and [employer].   ~ [employer] Denied Dental Coverage Claims on 5 Different Occasions. CSW Continued Collaboration with Eilene Ghazi, Lucina Mellow with the [place] of Health and [employer] (# [phone]), to [employer] on [employer].   ~ HIPAA Compliant Messages Left on Voicemail, as CSW Continues to Await a Return Call. CSW Collaboration with [place] for [name], [employer], to Request Assistance with Obtaining Verdict Letter.  ~ Email Submitted on 07/01/2022. CSW Collaboration with Unice Bailey, Representative with [facility] Claims Department (# [phone]), to Request Complete Report of Needed Medical Procedures from the [place] of [facility]. ~ HIPAA Compliant Messages Left on Voicemail, as CSW Continues to Await a Return Call. CSW Environmental education officer from the [facility] [phone]), to Confirm Approval of  Dental Procedures, at a Much Reduced Cost. CSW Collaboration with Bryna Colander, Paralegal for Lucina Mellow, Eilene Ghazi with the [employer] of Health and [employer] [phone]), to Request Status of Cytogeneticist. ~ HIPAA Compliant Messages Left on Voicemail, as CSW Continues to Await a Return Call. CSW Environmental education officer from [employer], through the [employer] (# [phone]), to [employer] About Patient's Rights.  CSW Environmental education officer from the [place], Instructing CSW to [employer] in Writing and Submit to [email or web address].  ~ HIPAA Compliant Email Submitted on 07/01/2022. CSW Collaboration with Representative from [employer] (# [phone]), to Confirm Patient's Dental Coverage of $2,000.00 Per Fiscal Year.   Patient Requires 4 Implants to Lower Jaw and 2 Implants to Upper Jaw (Both Orbital Cavities Broken). Patient Has Received 3 Quotes from [facility], in the Amounts of: ~ 1st Opinion - $62,000.00 ~ 2nd Opinion - $40.000.00 ~ 3rd Opinion - $16,000.00 Patient's Retail banker # [id number].        SDOH assessments and interventions completed:  Yes.  Care Coordination Interventions Activated:  Yes.   Care Coordination Interventions:  Yes, provided.   Follow up plan: Follow up call scheduled for 07/15/2022 at 10:30 am.  Encounter Outcome:  Pt. Visit Completed.   Nat Christen, BSW, MSW, LCSW  Licensed Education officer, environmental Health System  Mailing [place] [address] [place], [place] [address] Physical Address-[address] [place], [place] [address] Toll Free Main # [phone] Fax # [phone] Cell #  [phone] [email or web address]

## 2022-07-01 NOTE — Patient Instructions (Signed)
Visit Information  Thank you for taking time to visit with me today. Please don't hesitate to contact me if I can be of assistance to you.   Following are the goals we discussed today:   Goals Addressed               This Visit's Progress     Assist with Federal Appeals Process to Obtain Financial Coverage for Dental Implants. (pt-stated)   On track     Care Coordination Interventions:  Active Listening/Reflection Utilized.  Emotional Support Provided. Verbalization of Feelings and Frustration Encouraged.  Continue to Await Verdict Letter in the Mail, from [employer] on 05/15/2022, with the [place] of [employer] and [employer].   ~ [employer] Denied Dental Coverage Claims on 5 Different Occasions. CSW Continued Collaboration with Eilene Ghazi, Lucina Mellow with the [place] of Health and [employer] (# [phone]), to [employer] on [employer].   ~ HIPAA Compliant Messages Left on Voicemail, as CSW Continues to Await a Return Call. CSW Collaboration with [place] for [place], [employer], to Request Assistance with Obtaining Verdict Letter.  ~ Email Submitted on 07/01/2022. CSW Collaboration with Unice Bailey, Representative with [facility] Claims Department (# [phone]), to Request Complete Report of Needed Medical Procedures from the [place] of [facility]. ~ HIPAA Compliant Messages Left on Voicemail, as CSW Continues to Await a Return Call. CSW Environmental education officer from the [facility] [phone]), to Confirm Approval of Dental Procedures, at a Much Reduced Cost. CSW Collaboration with Bryna Colander, Paralegal for Lucina Mellow, Eilene Ghazi with the [employer] of Health and [employer] [phone]), to Request Status of Production manager. ~ HIPAA Compliant Messages Left on Voicemail, as CSW Continues to Await a Return Call. CSW Environmental education officer from [employer], through the [employer] (# [phone]), to [employer] About Patient's Rights.  CSW Environmental education officer from the [place], Instructing CSW to [employer] in Writing and Submit to [email or web address].  ~ HIPAA Compliant Email Submitted on 07/01/2022. CSW Collaboration with Representative from [employer] (# [phone]), to Confirm Patient's Dental Coverage of $2,000.00 Per Fiscal Year.   Patient Requires 4 Implants to Lower Jaw and 2 Implants to Upper Jaw (Both Orbital Cavities Broken). Patient Has Received 3 Quotes from [facility], in the Amounts of: ~ 1st Opinion - $62,000.00 ~ 2nd Opinion - $40.000.00 ~ 3rd Opinion - $16,000.00 Patient's Retail banker # [id number].          Our next appointment is by telephone on 07/15/2022 at 10:30 am.  Please call the care guide team at [phone] if you need to cancel or reschedule your appointment.   If you are experiencing a Mental Health or [place] or need someone to talk to, please call the Suicide and Crisis Lifeline: 988 call the [place] National Suicide Prevention Lifeline: 223-558-2331 or TTY: 917-744-8375 TTY 825-483-6837) to talk to a trained counselor call 1-800-273-TALK (toll free, 24 hour hotline) go to [facility] [address] [place] [phone]) call the [place]: [phone] call 911  Patient verbalizes understanding of instructions and care plan provided today and agrees to view in [place]. Active MyChart status and patient understanding of  how to access instructions and care plan via MyChart confirmed with patient.      Telephone follow up appointment with care management team member scheduled for:  07/15/2022 at 10:30 am.  Nat Christen, BSW, MSW, [place]  Licensed Clinical Social Worker  [place]  Mailing [place]. [address] [place], [place] [address] Physical Address-[address] [place], [place] [address] Toll Free Main # [phone] Fax # [phone] Cell # [phone] [email or web address]

## 2022-07-09 ENCOUNTER — Ambulatory Visit (INDEPENDENT_AMBULATORY_CARE_PROVIDER_SITE_OTHER): Payer: Medicare Other | Admitting: Family Medicine

## 2022-07-09 ENCOUNTER — Encounter: Payer: Self-pay | Admitting: Family Medicine

## 2022-07-09 VITALS — BP 146/77 | HR 83 | Temp 95.9°F | Ht 66.0 in | Wt 149.2 lb

## 2022-07-09 DIAGNOSIS — E782 Mixed hyperlipidemia: Secondary | ICD-10-CM

## 2022-07-09 DIAGNOSIS — Z0001 Encounter for general adult medical examination with abnormal findings: Secondary | ICD-10-CM | POA: Diagnosis not present

## 2022-07-09 DIAGNOSIS — Z0000 Encounter for general adult medical examination without abnormal findings: Secondary | ICD-10-CM | POA: Diagnosis not present

## 2022-07-09 DIAGNOSIS — I2510 Atherosclerotic heart disease of native coronary artery without angina pectoris: Secondary | ICD-10-CM | POA: Diagnosis not present

## 2022-07-09 DIAGNOSIS — Z125 Encounter for screening for malignant neoplasm of prostate: Secondary | ICD-10-CM

## 2022-07-09 DIAGNOSIS — E559 Vitamin D deficiency, unspecified: Secondary | ICD-10-CM

## 2022-07-09 DIAGNOSIS — I10 Essential (primary) hypertension: Secondary | ICD-10-CM

## 2022-07-09 DIAGNOSIS — Z87891 Personal history of nicotine dependence: Secondary | ICD-10-CM

## 2022-07-09 NOTE — Progress Notes (Signed)
Subjective:  Patient ID: [name] Chroman., male    DOB: [birth year], 76 y.o.   MRN: [id number]  Patient Care Team: Baruch Gouty, FNP as PCP - General (Family Medicine) Nahser, Wonda Cheng, MD as PCP - Cardiology (Cardiology) Barbaraann Faster, RN as [place] Management [place], Maree Erie, LCSW as Education officer, museum (Licensed Holiday representative)   Chief Complaint:  [name] [name] and Annual Exam   HPI: [name] Wilhelmsen. is a 76 y.o. male presenting on 07/09/2022 for [place] and Annual Exam   Pt presents today for annual physical exam. He has a history of CAD s/p stenting, heart failure, hyperlipidemia, hypertension, vit D deficiency, former heavy smoker, 30+ pack year history, chronic jaw pain post traumatic event, missing teeth post traumatic event, trouble chewing/eating due to injury, and chronic back pain. He states he is doing ok overall. His biggest concern is his eating and drinking due to missing teeth and numbness to upper lip and jaw. He denies changes in weight, urine or bowel habits. He denies night sweats, weakness, confusion, fatigue, or malaise. No hematochezia, melena, or hemoptysis.    Relevant past medical, surgical, family, and social history reviewed and updated as indicated.  Allergies and medications reviewed and updated. Data reviewed: Chart in Epic.   Past Medical History:  Diagnosis Date   Coronary artery disease    Erectile dysfunction    HFrEF (heart failure with reduced ejection fraction) (HCC)    Echocardiogram 07/14/21:  EF 25, global HK, inf HK worse, Gr 2 DD, normal RVSF, mild to mod MR, AV sclerosis w/o AS Echocardiogram 2/23: EF 25-30, global HK, GLS -12.9, normal RVSF, mild to mod MR, AV sclerosis w/o AS    History of hiatal hernia    a lone time ago   Hyperlipidemia    [place] fracture [facility]) 03/22/2020   Myocardial infarction ([place])    x 2   Polio     Past Surgical History:  Procedure Laterality Date    APPENDECTOMY     CARDIAC CATHETERIZATION     has two stents placed   CERVICAL SPINE SURGERY     c3-4, 4-5,  5-6, 8 screws and plates   CORONARY STENT INTERVENTION N/A 07/15/2021   Procedure: CORONARY STENT INTERVENTION;  Surgeon: Lorretta Harp, MD;  Location: [facility];  Service: Cardiovascular;  Laterality: N/A;   CORONARY STENT PLACEMENT     forein body removal     metal sharp removed from right leg   LEFT HEART CATH AND CORONARY ANGIOGRAPHY N/A 11/15/2018   Procedure: LEFT HEART CATH AND CORONARY ANGIOGRAPHY;  Surgeon: Nelva Bush, MD;  Location: [facility];  Service: Cardiovascular;  Laterality: N/A;   LEFT HEART CATH AND CORONARY ANGIOGRAPHY N/A 07/15/2021   Procedure: LEFT HEART CATH AND CORONARY ANGIOGRAPHY;  Surgeon: Lorretta Harp, MD;  Location: [facility];  Service: Cardiovascular;  Laterality: N/A;   MANDIBULAR HARDWARE REMOVAL Bilateral 04/23/2020   Procedure: MANDIBULAR HARDWARE REMOVAL;  Surgeon: Izora Gala, MD;  Location: [facility];  Service: ENT;  Laterality: Bilateral;   ORIF MANDIBULAR FRACTURE N/A 03/14/2020   Procedure: OPEN REDUCTION INTERNAL FIXATION (ORIF) MID  FACE FRACTURE, MANDIBULAR FIXATION MODIFIED ARCH BARS;  Surgeon: Izora Gala, MD;  Location: [facility];  Service: ENT;  Laterality: N/A;   SCAR REVISION N/A 04/23/2020   Procedure: SCAR REVISION/ Upper Lip Repair;  Surgeon: Izora Gala, MD;  Location: [facility];  Service: ENT;  Laterality: N/A;    Social History   Socioeconomic History   Marital status: Divorced    Spouse name: Not on file   Number of children: 2   Years of education: 12   Highest education level: 12th grade  Occupational History   Occupation: retired    Comment: farmer  Tobacco Use   Smoking status: Former    Types: Cigarettes    Passive exposure: Past   Smokeless tobacco: Never  Vaping Use   Vaping Use: Never used  Substance and Sexual Activity   Alcohol use: Yes     Alcohol/week: 21.0 standard drinks of alcohol    Types: 21 Cans of beer per week    Comment: 2 beers daily   Drug use: No   Sexual activity: Not on file  Other Topics Concern   Not on file  Social History Narrative   [name] [name] has a restraining order against one of his sons.   Social Determinants of Health   Financial Resource Strain: Low Risk  (06/24/2022)   Overall Financial Resource Strain (CARDIA)    Difficulty of Paying Living Expenses: Not hard at all  Food Insecurity: No Food Insecurity (06/24/2022)   Hunger Vital Sign    Worried About Running Out of Food in the Last Year: Never true    Ran Out of Food in the Last Year: Never true  Transportation Needs: No Transportation Needs (06/24/2022)   PRAPARE - Hydrologist (Medical): No    Lack of Transportation (Non-Medical): No  Physical Activity: Sufficiently Active (06/05/2022)   Exercise Vital Sign    Days of Exercise per Week: 7 days    Minutes of Exercise per Session: 30 min  Stress: Stress Concern Present (06/05/2022)   [place]    Feeling of Stress : To some extent  Social Connections: Moderately Integrated (06/05/2022)   Social Connection and Isolation Panel [NHANES]    Frequency of Communication with Friends and Family: More than three times a week    Frequency of Social Gatherings with Friends and Family: More than three times a week    Attends Religious Services: More than 4 times per year    Active Member of [employer] or Organizations: Yes    Attends Music therapist: More than 4 times per year    Marital Status: Divorced  Intimate Partner Violence: Not At Risk (06/05/2022)   Humiliation, Afraid, Rape, and Kick questionnaire    Fear of Current or Ex-Partner: No    Emotionally Abused: No    Physically Abused: No    Sexually Abused: No    Outpatient Encounter Medications as of 07/09/2022  Medication Sig    aspirin 81 MG chewable tablet Chew 81 mg by mouth daily.   cholecalciferol (VITAMIN D3) 25 MCG (1000 UNIT) tablet Take 1,000 Units by mouth daily.    losartan (COZAAR) 50 MG tablet Take 1 tablet (50 mg total) by mouth daily.   nitroGLYCERIN (NITROSTAT) 0.4 MG SL tablet Place 1 tablet (0.4 mg total) under the tongue every 5 (five) minutes as needed for chest pain.   rosuvastatin (CRESTOR) 20 MG tablet Take 1 tablet (20 mg total) by mouth daily.   tadalafil (CIALIS) 10 MG tablet TAKE 1 TO 2 TABLETS AS DIRECTED AS NEEDED   triamcinolone cream (KENALOG) 0.1 % Apply 1 application topically 2 (two) times daily.   [DISCONTINUED] doxycycline (VIBRA-TABS) 100  MG tablet Take 1 tablet (100 mg total) by mouth 2 (two) times daily.   [DISCONTINUED] HYDROcodone-acetaminophen (NORCO) 7.5-325 MG tablet Take 1 tablet by mouth daily as needed.   [DISCONTINUED] metoprolol succinate (TOPROL XL) 25 MG 24 hr tablet Take 0.5 tablets (12.5 mg total) by mouth every evening. (Patient not taking: Reported on 07/09/2022)   No facility-administered encounter medications on file as of 07/09/2022.    Allergies  Allergen Reactions   Carvedilol     diarrhea   Lipitor [Atorvastatin] Other (See Comments)    "Makes pt feel funky"   Plavix [Clopidogrel] Rash    Review of Systems  Constitutional:  Negative for activity change, appetite change, chills, diaphoresis, fatigue, fever and unexpected weight change.  HENT:  Positive for dental problem and drooling. Negative for congestion.   Eyes: Negative.  Negative for photophobia and visual disturbance.  Respiratory:  Negative for cough, chest tightness and shortness of breath.   Cardiovascular:  Negative for chest pain, palpitations and leg swelling.  Gastrointestinal:  Negative for abdominal pain, blood in stool, constipation, diarrhea, nausea and vomiting.  Endocrine: Negative.   Genitourinary:  Negative for decreased urine volume, difficulty urinating, dysuria, enuresis,  flank pain, frequency, genital sores, hematuria, penile discharge, penile pain, penile swelling, scrotal swelling, testicular pain and urgency.  Musculoskeletal:  Positive for arthralgias, back pain and gait problem. Negative for myalgias.  Skin: Negative.   Allergic/Immunologic: Negative.   Neurological:  Negative for dizziness, tremors, seizures, syncope, facial asymmetry, speech difficulty, weakness, light-headedness, numbness and headaches.  Hematological: Negative.   Psychiatric/Behavioral:  Negative for confusion, hallucinations, sleep disturbance and suicidal ideas.   All other systems reviewed and are negative.       Objective:  BP (!) 146/77   Pulse 83   Temp (!) 95.9 F (35.5 C) (Temporal)   Ht $R'5\' 6"'YX$  (1.676 m)   Wt 149 lb 3.2 oz (67.7 kg)   SpO2 98%   BMI 24.08 kg/m    Wt Readings from Last 3 Encounters:  07/09/22 149 lb 3.2 oz (67.7 kg)  05/23/22 146 lb (66.2 kg)  05/06/22 144 lb 12.8 oz (65.7 kg)    Physical Exam Vitals and nursing note reviewed.  Constitutional:      General: He is not in acute distress.    Appearance: Normal appearance. He is well-developed and well-groomed. He is not ill-appearing, toxic-appearing or diaphoretic.  HENT:     Head: Normocephalic and atraumatic.     Jaw: There is normal jaw occlusion.     Right Ear: Hearing normal.     Left Ear: Hearing normal.     Nose: Nose normal.     Mouth/Throat:     Lips: Pink.     Mouth: Mucous membranes are moist.     Dentition: Abnormal dentition. Dental tenderness present. No gingival swelling or dental abscesses.     Pharynx: Oropharynx is clear. Uvula midline.  Eyes:     General: Lids are normal.     Extraocular Movements: Extraocular movements intact.     Conjunctiva/sclera: Conjunctivae normal.     Pupils: Pupils are equal, round, and reactive to light.  Neck:     Thyroid: No thyroid mass, thyromegaly or thyroid tenderness.     Vascular: No carotid bruit or JVD.     Trachea: Trachea and  phonation normal.  Cardiovascular:     Rate and Rhythm: Normal rate and regular rhythm.     Chest Wall: PMI is not displaced.  Pulses: Normal pulses.     Heart sounds: Normal heart sounds. No murmur heard.    No friction rub. No gallop.  Pulmonary:     Effort: Pulmonary effort is normal. No respiratory distress.     Breath sounds: Normal breath sounds. No wheezing.  Abdominal:     General: Bowel sounds are normal. There is no distension or abdominal bruit.     Palpations: Abdomen is soft. There is no hepatomegaly or splenomegaly.     Tenderness: There is no abdominal tenderness. There is no right CVA tenderness or left CVA tenderness.     Hernia: No hernia is present.  Musculoskeletal:        General: Normal range of motion.     Cervical back: Normal range of motion and neck supple.     Right lower leg: No edema.     Left lower leg: No edema.  Lymphadenopathy:     Cervical: No cervical adenopathy.  Skin:    General: Skin is warm and dry.     Capillary Refill: Capillary refill takes less than 2 seconds.     Coloration: Skin is not cyanotic, jaundiced or pale.     Findings: No rash.  Neurological:     General: No focal deficit present.     Mental Status: He is alert and oriented to person, place, and time.     Sensory: Sensation is intact.     Motor: Motor function is intact.     Coordination: Coordination is intact.     Gait: Gait is intact.     Deep Tendon Reflexes: Reflexes are normal and symmetric.  Psychiatric:        Attention and Perception: Attention and perception normal.        Mood and Affect: Mood and affect normal.        Speech: Speech normal.        Behavior: Behavior normal. Behavior is cooperative.        Thought Content: Thought content normal.        Cognition and Memory: Cognition and memory normal.        Judgment: Judgment normal.     Results for orders placed or performed in visit on 05/06/22  Lipid panel  Result Value Ref Range   Cholesterol,  Total 142 100 - 199 mg/dL   Triglycerides 70 0 - 149 mg/dL   HDL 48 >39 mg/dL   VLDL Cholesterol Cal 14 5 - 40 mg/dL   LDL Chol Calc (NIH) 80 0 - 99 mg/dL   Chol/HDL Ratio 3.0 0.0 - 5.0 ratio  Comp Met (CMET)  Result Value Ref Range   Glucose 86 70 - 99 mg/dL   BUN 20 8 - 27 mg/dL   Creatinine, Ser 0.85 0.76 - 1.27 mg/dL   eGFR 90 >59 mL/min/1.73   BUN/Creatinine Ratio 24 10 - 24   Sodium 141 134 - 144 mmol/L   Potassium 4.1 3.5 - 5.2 mmol/L   Chloride 103 96 - 106 mmol/L   CO2 24 20 - 29 mmol/L   Calcium 9.0 8.6 - 10.2 mg/dL   Total Protein 6.5 6.0 - 8.5 g/dL   Albumin 4.2 3.8 - 4.8 g/dL   Globulin, Total 2.3 1.5 - 4.5 g/dL   Albumin/Globulin Ratio 1.8 1.2 - 2.2   Bilirubin Total 0.4 0.0 - 1.2 mg/dL   Alkaline Phosphatase 77 44 - 121 IU/L   AST 17 0 - 40 IU/L   ALT 13 0 - 44 IU/L  Pertinent labs & imaging results that were available during my care of the patient were reviewed by me and considered in my medical decision making.  Assessment & Plan:  [name] [name] was seen today for establish care and annual exam.  Diagnoses and all orders for this visit:  Annual physical exam Health maintenance discussed in detail. Labs and imaging pending.  -     CBC with Differential/Platelet -     CMP14+EGFR -     Lipid panel -     Thyroid Panel With TSH -     VITAMIN D 25 Hydroxy (Vit-D Deficiency, Fractures) -     PSA, total and free -     CT CHEST LUNG CA SCREEN LOW DOSE W/O CM; Future  Mixed hyperlipidemia Diet encouraged - increase intake of fresh fruits and vegetables, increase intake of lean proteins. Bake, broil, or grill foods. Avoid fried, greasy, and fatty foods. Avoid fast foods. Increase intake of fiber-rich whole grains. Exercise encouraged - at least 150 minutes per week and advance as tolerated. Goal BMI < 25. Continue medications as prescribed. Follow up in 3-6 months as discussed.  -     CMP14+EGFR -     Lipid panel  Primary hypertension BP fairly controlled.  Changes were not made in regimen today. Goal BP is 130/80. Pt aware to report any persistent high or low readings. DASH diet and exercise encouraged. Exercise at least 150 minutes per week and increase as tolerated. Goal BMI > 25. Stress management encouraged. Avoid nicotine and tobacco product use. Avoid excessive alcohol and NSAID's. Avoid more than 2000 mg of sodium daily. Medications as prescribed. Follow up as scheduled.  -     CBC with Differential/Platelet -     CMP14+EGFR -     Lipid panel -     Thyroid Panel With TSH  Coronary artery disease involving native coronary artery of native heart without angina pectoris Followed by cardiology on a regular basis. Stopped BB therapy, states cardiology aware.  -     CMP14+EGFR -     Lipid panel  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) Will obtain LDCT chest for lung cancer screening.  -     CT CHEST LUNG CA SCREEN LOW DOSE W/O CM; Future  Vitamin D deficiency Labs pending. Eat foods rich in Vit D including milk, orange juice, yogurt with vitamin D added, salmon or mackerel, canned tuna fish, cereals with vitamin D added, and cod liver oil. Get out in the sun but make sure to wear at least SPF 30 sunscreen.  -     VITAMIN D 25 Hydroxy (Vit-D Deficiency, Fractures)  Screening for prostate cancer -     PSA, total and free     Continue all other maintenance medications.  Follow up plan: Return in about 6 months (around 01/08/2023), or if symptoms worsen or fail to improve.   Continue healthy lifestyle choices, including diet (rich in fruits, vegetables, and lean proteins, and low in salt and simple carbohydrates) and exercise (at least 30 minutes of moderate physical activity daily).  Educational handout given for health maintenance  The above assessment and management plan was discussed with the patient. The patient verbalized understanding of and has agreed to the management plan. Patient is aware to call the clinic if they  develop any new symptoms or if symptoms persist or worsen. Patient is aware when to return to the clinic for a follow-up visit. Patient educated on when it is appropriate to  go to the emergency department.   Monia Pouch, FNP-C [facility] [phone]

## 2022-07-10 ENCOUNTER — Encounter: Payer: Medicare Other | Admitting: Family Medicine

## 2022-07-10 LAB — LIPID PANEL
Chol/HDL Ratio: 2.6 ratio (ref 0.0–5.0)
Cholesterol, Total: 101 mg/dL (ref 100–199)
HDL: 39 mg/dL — ABNORMAL LOW (ref >39)
LDL Chol Calc (NIH): 46 mg/dL (ref 0–99)
Triglycerides: 75 mg/dL (ref 0–149)
VLDL Cholesterol Cal: 16 mg/dL (ref 5–40)

## 2022-07-10 LAB — CBC WITH DIFFERENTIAL/PLATELET
Basophils Absolute: 0.1 10*3/uL (ref 0.0–0.2)
Basos: 1 %
EOS (ABSOLUTE): 0.4 10*3/uL (ref 0.0–0.4)
Eos: 5 %
Hematocrit: 46 % (ref 37.5–51.0)
Hemoglobin: 15.4 g/dL (ref 13.0–17.7)
Immature Grans (Abs): 0 10*3/uL (ref 0.0–0.1)
Immature Granulocytes: 0 %
Lymphocytes Absolute: 1.8 10*3/uL (ref 0.7–3.1)
Lymphs: 24 %
MCH: 31.8 pg (ref 26.6–33.0)
MCHC: 33.5 g/dL (ref 31.5–35.7)
MCV: 95 fL (ref 79–97)
Monocytes Absolute: 0.7 10*3/uL (ref 0.1–0.9)
Monocytes: 9 %
Neutrophils Absolute: 4.6 10*3/uL (ref 1.4–7.0)
Neutrophils: 61 %
Platelets: 183 10*3/uL (ref 150–450)
RBC: 4.84 x10E6/uL (ref 4.14–5.80)
RDW: 12.1 % (ref 11.6–15.4)
WBC: 7.4 10*3/uL (ref 3.4–10.8)

## 2022-07-10 LAB — CMP14+EGFR
ALT: 19 IU/L (ref 0–44)
AST: 19 IU/L (ref 0–40)
Albumin/Globulin Ratio: 1.7 (ref 1.2–2.2)
Albumin: 4.2 g/dL (ref 3.8–4.8)
Alkaline Phosphatase: 80 IU/L (ref 44–121)
BUN/Creatinine Ratio: 23 (ref 10–24)
BUN: 20 mg/dL (ref 8–27)
Bilirubin Total: 0.5 mg/dL (ref 0.0–1.2)
CO2: 24 mmol/L (ref 20–29)
Calcium: 9.7 mg/dL (ref 8.6–10.2)
Chloride: 102 mmol/L (ref 96–106)
Creatinine, Ser: 0.87 mg/dL (ref 0.76–1.27)
Globulin, Total: 2.5 g/dL (ref 1.5–4.5)
Glucose: 72 mg/dL (ref 70–99)
Potassium: 4.6 mmol/L (ref 3.5–5.2)
Sodium: 140 mmol/L (ref 134–144)
Total Protein: 6.7 g/dL (ref 6.0–8.5)
eGFR: 89 mL/min/{1.73_m2} (ref >59)

## 2022-07-10 LAB — PSA, TOTAL AND FREE
PSA, Free Pct: 30 %
PSA, Free: 0.15 ng/mL
Prostate Specific Ag, Serum: 0.5 ng/mL (ref 0.0–4.0)

## 2022-07-10 LAB — VITAMIN D 25 HYDROXY (VIT D DEFICIENCY, FRACTURES): Vit D, 25-Hydroxy: 32 ng/mL (ref 30.0–100.0)

## 2022-07-10 LAB — THYROID PANEL WITH TSH
Free Thyroxine Index: 2 (ref 1.2–4.9)
T3 Uptake Ratio: 29 % (ref 24–39)
T4, Total: 6.8 ug/dL (ref 4.5–12.0)
TSH: 0.925 u[IU]/mL (ref 0.450–4.500)

## 2022-07-15 ENCOUNTER — Ambulatory Visit: Payer: Self-pay | Admitting: *Deleted

## 2022-07-15 ENCOUNTER — Encounter: Payer: Self-pay | Admitting: *Deleted

## 2022-07-15 NOTE — Patient Outreach (Addendum)
Care Coordination   Follow Up Visit Note   07/15/2022  Name: [name] [name]. MRN: [id number] DOB: Mar 01, 1946  [name] [name]. is a 76 y.o. year old male who sees Rakes, Connye Burkitt, FNP for primary care. I spoke with [name] [name]. by phone today.  What matters to the patients health and wellness today?  Assist with [employer] Process to [employer] Coverage for Dental Implants.   Goals Addressed               This Visit's Progress     Assist with Federal Appeals Process to Obtain Financial Coverage for Dental Implants. (pt-stated)   On track     Care Coordination Interventions:  Active Listening/Reflection Utilized.  Emotional Support Provided. Verbalization of Feelings and Frustration Encouraged.  Continue to Await Verdict Letter in the Mail, from [employer] on 05/15/2022, with the [place] of [employer] and [employer].   ~ [employer] Denied Dental Coverage Claims on 5 Different Occasions. CSW Continued Collaboration with Eilene Ghazi, Lucina Mellow with the [place] of Health and [employer] (# [phone]), to [employer] on [employer].   ~ HIPAA Compliant Messages Left on Voicemail, as CSW Continues to Await a Return Call. CSW Collaboration with [place] for [place], [employer], to Request Assistance with Obtaining Verdict Letter.  ~ Email Submitted on 07/01/2022, 07/08/2022 and 07/15/2022. CSW Collaboration with Unice Bailey, Representative with [facility] Claims Department (# [phone]), to Request Complete Report of Needed Medical Procedures from the [place] of [facility]. ~ HIPAA Compliant Messages Left on Voicemail, as CSW Continues to Await a Return Call. CSW Environmental education officer from the [facility] [phone]), to  Confirm Approval of Dental Procedures, at a Much Reduced Cost. CSW Collaboration with Bryna Colander, Paralegal for Lucina Mellow, Eilene Ghazi with the [employer] of Health and [employer] [phone]), to Request Status of Cytogeneticist. ~ HIPAA Compliant Messages Left on Voicemail, as CSW Continues to Await a Return Call. CSW Environmental education officer from [employer], through the [employer] (# [phone]), to [employer] About Patient's Rights.  CSW Environmental education officer from the [employer] of [employer] and [employer], Instructing CSW to [employer] in Writing and Submit to [email or web address].  ~ HIPAA Compliant Email Submitted on 07/01/2022, 07/08/2022 and 07/15/2022. CSW Collaboration with Representative from [employer] of [employer] and Appeals [phone] or # [id number], or # [phone]), to Check Status of Court Ruling.  ~ HIPAA Compliant Message Left on Voicemail, as CSW Continues to Await a Return Call. CSW Environmental education officer from the [place], [employer] in [place], [place] (# [phone]), to Check Status of Midwife.  ~ HIPAA Compliant Message Left on Voicemail, as CSW Continues to Await a Return Call. CSW Collaboration with Representative from [employer] (# [phone]), to Confirm Patient's Dental Coverage of $2,000.00 Per Fiscal Year.   Patient Requires 4 Implants to Lower Jaw and 2 Implants to Upper Jaw (Both Orbital Cavities Broken). Patient Has Received 3 Quotes from [facility], in the Amounts of: ~ 1st Opinion - $62,000.00 ~ 2nd Opinion - $40.000.00 ~ 3rd Opinion - $16,000.00 Patient's Retail banker # [id number].  SDOH assessments and interventions completed:  Yes.   Care Coordination Interventions  Activated:  Yes.    Care Coordination Interventions:  Yes, provided.    Follow up plan: Follow up call scheduled for 07/29/2022 at 1:00 pm.   Encounter Outcome:  Pt. Visit Completed.    [name], BSW, MSW, LCSW  Licensed Restaurant manager, [facility]  Mailing [place] [address], [place], [place] [address] Physical Address-[address] [place], [place] [address] Toll Free Main # [phone] Fax # [phone] Cell # [phone] [email or web address]
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
## 2022-07-16 ENCOUNTER — Ambulatory Visit: Payer: Medicare Other | Attending: Physician Assistant | Admitting: Physician Assistant

## 2022-07-16 ENCOUNTER — Encounter: Payer: Self-pay | Admitting: Physician Assistant

## 2022-07-16 VITALS — BP 124/56 | HR 88 | Ht 66.0 in | Wt 148.4 lb

## 2022-07-16 DIAGNOSIS — I2510 Atherosclerotic heart disease of native coronary artery without angina pectoris: Secondary | ICD-10-CM

## 2022-07-16 DIAGNOSIS — E782 Mixed hyperlipidemia: Secondary | ICD-10-CM

## 2022-07-16 DIAGNOSIS — I5020 Unspecified systolic (congestive) heart failure: Secondary | ICD-10-CM

## 2022-07-16 DIAGNOSIS — I10 Essential (primary) hypertension: Secondary | ICD-10-CM | POA: Diagnosis not present

## 2022-07-16 DIAGNOSIS — I493 Ventricular premature depolarization: Secondary | ICD-10-CM | POA: Insufficient documentation

## 2022-07-16 MED ORDER — METOPROLOL SUCCINATE ER 25 MG PO TB24: 12.5000 mg | ORAL_TABLET | Freq: Every day | ORAL | 3 refills | Status: DC

## 2022-07-16 NOTE — Assessment & Plan Note (Signed)
Hx of prior MI in 2002 tx with stent to the RCA, PCI to the LAD and, most recently a NSTEMI in 06/2021 tx with DES x 2 to the RCA. He had difficulty tolerating Brilinta and Plavix. He was not able to complete 12 mos of DAPT and has essentially been on ASA only for the last several mos. He is > 1 year out now. He is doing well w/o angina. Continue ASA 81 mg once daily, Crestor 20 mg once daily. F/u in 6 mos

## 2022-07-16 NOTE — Assessment & Plan Note (Addendum)
Noted on EKG in August 2023.  He has PVCs evident on exam today.  Recent K+ normal. As noted, resume Toprol-XL 12.5 mg daily.

## 2022-07-16 NOTE — Assessment & Plan Note (Signed)
Blood pressure well controlled.  Continue losartan 50 mg daily.  Resume Toprol-XL as outlined above for GDMT with heart failure.

## 2022-07-16 NOTE — Assessment & Plan Note (Signed)
Ischemic cardiomyopathy. NYHA II. Volume status stable. 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 He has had significant difficulty tolerating medications. I am not certain we will ever be able to get him on max GDMT. He is no longer taking Toprol for unclear reasons. He is willing to try it again. He remains on Losartan. I had a long d/w him to see if he would be open to ICD implantation. He would be open and would like to see one of our EPs. He wants to hold off until after deer hunting season is over.  Continue losartan 50 mg daily Restart Toprol-XL 12.5 mg daily Refer to EP for consideration of ICD Follow-up 6 months

## 2022-07-16 NOTE — Patient Instructions (Signed)
Visit Information  Thank you for taking time to visit with me today. Please don't hesitate to contact me if I can be of assistance to you.   Following are the goals we discussed today:   Goals Addressed               This Visit's Progress     Assist with Federal Appeals Process to Obtain Financial Coverage for Dental Implants. (pt-stated)   On track     Care Coordination Interventions:  Active Listening/Reflection Utilized.  Emotional Support Provided. Verbalization of Feelings and Frustration Encouraged.  Continue to Await Verdict Letter in the Mail, from [employer] on 05/15/2022, with the [place] Department of Health and [employer] - Office of [employer] and Goldman Sachs.   ~ CBS Corporation Denied Dental Coverage Claims on 5 Different Occasions. CSW Continued Collaboration with Clent Jacks, Gwenyth Ober with the [place] of Health and [employer] (# [phone]), to [employer] on Valero [employer].   ~ HIPAA Compliant Messages Left on Voicemail, as CSW Continues to Await a Return Call. CSW Collaboration with [place] Dan Maker for Organ, Shana Chute, to Request Assistance with Obtaining Verdict Letter.  ~ Email Submitted on 07/01/2022, 07/08/2022 and 07/15/2022. CSW Collaboration with Laqueta Carina, Representative with [facility] Claims Department (# [phone]), to Request Complete Report of Needed Medical Procedures from the [place] of [facility]. ~ HIPAA Compliant Messages Left on Voicemail, as CSW Continues to Await a Return Call. CSW Energy manager from the [place] of [facility] [phone]), to Confirm Approval of Dental Procedures, at a Much Reduced Cost. CSW Collaboration with Garald Balding, Paralegal for Gwenyth Ober, Clent Jacks with the [employer] of Health and [employer] [phone]), to  Request Status of Immunologist. ~ HIPAA Compliant Messages Left on Voicemail, as CSW Continues to Await a Return Call. CSW Energy manager from [employer], through the [employer] (# [phone]), to Land[employer] About Patient's Rights.  CSW Energy manager from the [employer] of Principal Financial and A[employer], Instructing CSW to [employer] in Writing and Submit to [email or web address].  ~ HIPAA Compliant Email Submitted on 07/01/2022, 07/08/2022 and 07/15/2022. CSW Collaboration with Representative from Le[employer] of [employer] and Appeals [phone] or # [id number], or # [phone]), to Check Status of Court Ruling.  ~ HIPAA Compliant Message Left on Voicemail, as CSW Continues to Await a Return Call. CSW Energy manager from the [place] of Principal Financial and [employer], [employer] in [place], [place] (# [phone]), to Check Status of Metallurgist.  ~ HIPAA Compliant Message Left on Voicemail, as CSW Continues to Await a Return Call. CSW Collaboration with Representative from Express Scripts (# [phone]), to Confirm Patient's Dental Coverage of $2,000.00 Per Fiscal Year.   Patient Requires 4 Implants to Lower Jaw and 2 Implants to Upper Jaw (Both Orbital Cavities Broken). Patient Has Received 3 Quotes from Reputable Cosmetic Dentists, in the Amounts of: ~ 1st Opinion - $62,000.00 ~ 2nd Opinion - $40.000.00 ~ 3rd Opinion - $16,000.00 Patient's Consulting civil engineer # [id number].        Our next appointment is by telephone on 07/29/2022 at 1:00 pm.  Please call the care guide team at [phone] if you need to cancel or reschedule your appointment.   If you are experiencing a Mental Health or Behavioral  Health Crisis or need someone to talk to, please call the Suicide and Crisis Lifeline:  988 call the [place] National Suicide Prevention Lifeline: (469) 196-7986 or TTY: 715-111-7621 TTY (720)325-9430) to talk to a trained counselor call 1-800-273-TALK (toll free, 24 hour hotline) go to [facility] [address], [place] [phone]) call the [place]: 254-281-4434 call 911  Patient verbalizes understanding of instructions and care plan provided today and agrees to view in [place]. Active MyChart status and patient understanding of how to access instructions and care plan via MyChart confirmed with patient.     Telephone follow up appointment with care management team member scheduled for:  07/29/2022 at 1:00 pm.  Nat Christen, BSW, MSW, [place]  Licensed Clinical Social Worker  [place]  Mailing [place]. [address], [place], [place] [address] Physical Address-[address] [place], [place] [address] Toll Free Main # [phone] Fax # [phone] Cell # [phone] [email or web address]

## 2022-07-16 NOTE — Patient Instructions (Signed)
Medication Instructions:  Your physician has recommended you make the following change in your medication:   START Toprol XL 25 mg taking 1/2 tablet daily   *If you need a refill on your cardiac medications before your next appointment, please call your pharmacy*   Lab Work: None ordered  If you have labs (blood work) drawn today and your tests are completely normal, you will receive your results only by: [place] (if you have MyChart) OR A paper copy in the mail If you have any lab test that is abnormal or we need to change your treatment, we will call you to review the results.   Testing/Procedures: None ordered  You have been referred to see one of our Electrophysiology here to discuss a ICD.  They will call you to arrange this.   Follow-Up: At [facility], you and your health needs are our priority.  As part of our continuing mission to provide you with exceptional heart care, we have created designated Provider Care Teams.  These Care Teams include your primary Cardiologist (physician) and Advanced Practice Providers (APPs -  Physician Assistants and Nurse Practitioners) who all work together to provide you with the care you need, when you need it.  We recommend signing up for the patient portal called "MyChart".  Sign up information is provided on this After Visit Summary.  MyChart is used to connect with patients for Virtual Visits (Telemedicine).  Patients are able to view lab/test results, encounter notes, upcoming appointments, etc.  Non-urgent messages can be sent to your provider as well.   To learn more about what you can do with MyChart, go to [email or web address].    Your next appointment:   6 month(s)  The format for your next appointment:   In Person  Provider:   Mertie Moores, MD     Other Instructions   Important Information About Sugar

## 2022-07-16 NOTE — Assessment & Plan Note (Signed)
Lipids optimal.  Continue Crestor 20 mg daily. 

## 2022-07-22 ENCOUNTER — Ambulatory Visit: Payer: Self-pay | Admitting: *Deleted

## 2022-07-22 ENCOUNTER — Encounter: Payer: Self-pay | Admitting: *Deleted

## 2022-07-22 NOTE — Patient Outreach (Signed)
Care Coordination   Follow Up Visit Note   07/22/2022  Name: Lizandro Spellman. MRN: [id number] DOB: 09-13-1946  [name] is a 76 y.o. year old male who sees Rakes, Doralee Albino, FNP for primary care. I spoke with [name] by phone today.  What matters to the patients health and wellness today?   Assist with [employer] Process to [employer] Pal[employer] Coverage for Dental Implants.   Goals Addressed               This Visit's Progress     Assist with Federal Appeals Process to Obtain Financial Coverage for Dental Implants. (pt-stated)   On track     Care Coordination Interventions:  Active Listening/Reflection Utilized.  Emotional Support Provided. Task-Centered Strategies Employed. Verbalization of Feelings and Frustration Encouraged.  Continue to Await Verdict Letter in the Mail, from [employer] on 05/15/2022, with the [place] Department of Health and [employer] - Office of [employer] and Goldman Sachs.   ~ CBS Corporation Denied 5 Dental Coverage Claims, Prior to [employer]. ~ CBS Corporation Denied [employer] Group, After [employer].  CSW Continued Collaboration with Clent Jacks, Gwenyth Ober with the [place] of Health and [employer] (# [phone]), to [employer] on Valero [employer].   ~ HIPAA Compliant Messages Left on Voicemail, as CSW Continues to Await a Return Call. ~ No Additional Outreach Calls Attempted. CSW Collaboration with [place] Dan Maker for [place], Shana Chute, to Request Assistance with Obtaining Verdict Letter.  ~ Email Submitted on 07/01/2022, 07/08/2022 and 07/15/2022.  ~ No Additional Emails Submitted. CSW Collaboration with Laqueta Carina, Representative with [facility] Claims Department (# [phone]), to Request Complete Report of Needed Medical Procedures from the [place] of [facility]. ~ HIPAA Compliant Messages Left on Voicemail, as CSW Continues to Await a Return Call. ~ No Additional Outreach Calls Attempted. CSW Energy manager from the [place] of [facility] [phone]), to Confirm Approval of Dental Procedures, at a Much Reduced Cost. CSW Collaboration with Garald Balding, Paralegal for Gwenyth Ober, Clent Jacks with the [employer] of Health and [employer] [phone]), to Request Status of Immunologist. ~ HIPAA Compliant Messages Left on Voicemail, as CSW Continues to Await a Return Call. ~ No Additional Outreach Calls Attempted. CSW Energy manager from [employer], through the [employer] (# [phone]), to Land[employer] Financial About Patient's Rights.  CSW Collaboration with Representative from the [employer] of Principal Financial and A[employer], Instructing CSW to Yahoo[employer] Inc in Writing and Submit to [email or web address].  ~ HIPAA Compliant Email Submitted on 07/01/2022, 07/08/2022 and 07/15/2022.  ~ No Additional Outreach Calls Attempted. CSW Collaboration with Representative from Lehman [employer] of [employer] and Appeals [phone] or # [id number], or # [phone]), to Check Status of Court Ruling. ~ HIPAA Compliant Message Left on Voicemail, as CSW Continues to Await a Return Call.  ~ No Additional Outreach Calls Attempted. CSW Energy manager from the [place] of Principal Financial and [employer], [employer] in [place], [place] (# [phone]), to Check Status of Metallurgist. ~ HIPAA Compliant Message Left on Voicemail, as CSW Continues to Await a Return Call.  ~ No Additional [employer]der System Attempted. CSW Collaboration with Representative from Express Scripts (# [phone]), to Confirm Patient's Dental Coverage of $2,000.00  Per  [employer].   Patient Requires 4 Implants to Lower Jaw and 2 Implants to Upper Jaw (Both Orbital Cavities Broken). Patient Has Received 3 Quotes from [facility], in the Amounts of: ~ 1st Opinion - $62,000.00 ~ 2nd Opinion - $40.000.00 ~ 3rd Opinion - $16,000.00 Patient's Retail banker # [id number]. Keep Initial Outpatient Visit with CSW, Scheduled on 07/23/2022 at 1:00 PM, at [facility], to [place] to [employer].        SDOH assessments and interventions completed:  Yes.   Care Coordination Interventions Activated:  Yes.    Care Coordination Interventions:  Yes, provided.    Follow up plan: Follow up call scheduled for 07/23/2022 at 1:00 pm.   Encounter Outcome:  Pt. Visit Completed.    Nat Christen, BSW, MSW, LCSW  Licensed Education officer, environmental Health System  Mailing [place] [address], [place], [place] [address] Physical Address-[address] [place], [place] [address] Toll Free Main # [phone] Fax # [phone] Cell # [phone] [email or web address]

## 2022-07-22 NOTE — Patient Instructions (Signed)
Visit Information  Thank you for taking time to visit with me today. Please don't hesitate to contact me if I can be of assistance to you.   Following are the goals we discussed today:   Goals Addressed               This Visit's Progress     Assist with Federal Appeals Process to Obtain Financial Coverage for Dental Implants. (pt-stated)   On track     Care Coordination Interventions:  Active Listening/Reflection Utilized.  Emotional Support Provided. Task-Centered Strategies Employed. Verbalization of Feelings and Frustration Encouraged.  Continue to Await Verdict Letter in the Mail, from [employer] on 05/15/2022, with the [place] of [employer] and [employer].   ~ Entergy Corporation Denied 5 Dental Coverage Claims, Prior to [employer]. ~ [place], After [employer].  CSW Continued Collaboration with Eilene Ghazi, Lucina Mellow with the [place] of Health and [employer] (# [phone]), to International Paper on USG Corporation.   ~ HIPAA Compliant Messages Left on Voicemail, as CSW Continues to Await a Return Call. ~ No Additional Outreach Calls Attempted. CSW Collaboration with [place] for [place], [employer], to Request Assistance with Obtaining Verdict Letter.  ~ Email Submitted on 07/01/2022, 07/08/2022 and 07/15/2022.  ~ No Additional Emails Submitted. CSW Collaboration with Unice Bailey, Representative with [facility] Claims Department (# [phone]), to Request Complete Report of Needed Medical Procedures from the [place] of [facility]. ~ HIPAA Compliant Messages Left on Voicemail, as CSW Continues to Await a Return Call. ~ No Additional Outreach Calls Attempted. CSW Environmental education officer from the [facility] [phone]), to Confirm Approval of Dental Procedures, at a Much Reduced Cost. CSW Collaboration with Bryna Colander, Paralegal for Lucina Mellow, Eilene Ghazi with the U.S. Bancorp of Health and [employer] [phone]), to Request Status of Cytogeneticist. ~ HIPAA Compliant Messages Left on Voicemail, as CSW Continues to Await a Return Call. ~ No Additional Outreach Calls Attempted. CSW Environmental education officer from [employer], through the [employer] (# [phone]), to [employer]en Corporation About Patient's Rights.  CSW Collaboration with Representative from the U.S. Bancorp of [employer] International and [employer] Insurance and Annuity Association, Instructing CSW to Thrivent Financial in Writing and Submit to [email or web address].  ~ HIPAA Compliant Email Submitted on 07/01/2022, 07/08/2022 and 07/15/2022.  ~ No Additional Outreach Calls Attempted. CSW Collaboration with Representative from [employer] of Cit[employer] and Appeals [phone] or # [id number], or # [phone]), to Check Status of Court Ruling. ~ HIPAA Compliant Message Left on Voicemail, as CSW Continues to Await a Return Call.  ~ No Additional Outreach Calls Attempted. CSW Environmental education officer from the [place], [employer] in [place], [place] (# [phone]), to Check Status of Midwife. ~ HIPAA Compliant Message Left on Voicemail, as CSW Continues to Await a Return Call.  ~ No Additional Plains [employer]ipeline Attempted. CSW Collaboration with Representative from [employer] (# [phone]), to Confirm Patient's Dental Coverage of $2,000.00 Per Fiscal Year.   Patient Requires 4 Implants to Lower Jaw and 2 Implants to Upper Jaw (Both Orbital Cavities Broken). Patient Has Received 3 Quotes from [facility], in the Amounts of: ~ 1st Opinion -  $  62,000.00 ~ 2nd Opinion - $40.000.00 ~ 3rd Opinion - $16,000.00 Patient's Retail banker # [id number]. Keep Initial Outpatient Visit with CSW, Scheduled on 07/23/2022 at 1:00 PM, at [facility], to [place] to [employer].        Our next appointment is by telephone on 07/23/2022 at 1:00 pm.  Please call the care guide team at [phone] if you need to cancel or reschedule your appointment.   If you are experiencing a Mental Health or [place] or need someone to talk to, please call the Suicide and Crisis Lifeline: 988 call the [place] National Suicide Prevention Lifeline: 301 638 9736 or TTY: 260-557-4920 TTY 4374006844) to talk to a trained counselor call 1-800-273-TALK (toll free, 24 hour hotline) go to [facility] [address] [place] [phone]) call the [place]: [phone] call 911  Patient verbalizes understanding of instructions and care plan provided today and agrees to view in [place]. Active MyChart status and patient understanding of how to access instructions and care plan via MyChart confirmed with patient.     Telephone follow up appointment with care management team member scheduled for:  07/23/2022 at 1:00 pm.  Nat Christen, BSW, MSW, [place]  Licensed Clinical Social Worker  [place]  Mailing [place]. [address] [place], [place] [address] Physical Address-[address] [place], [place] [address] Toll Free Main # [phone] Fax # [phone] Cell # [phone] [email or web address]

## 2022-07-23 ENCOUNTER — Other Ambulatory Visit: Payer: Self-pay | Admitting: *Deleted

## 2022-07-25 ENCOUNTER — Ambulatory Visit: Payer: Self-pay | Admitting: *Deleted

## 2022-07-25 NOTE — Patient Outreach (Signed)
  Care Coordination   Follow Up Visit Note   07/25/2022 Name: [name] [name]. MRN: [id number] DOB: 03-03-1946  [name] Blood. is a 76 y.o. year old male who sees Rakes, Doralee Albino, FNP for primary care. I spoke with  [name] Blood. by phone today.  What matters to the patients health and wellness today?  "Still having trouble with teeth and eating"  He continues to have daily drooling If he does not concentrate on keeping his mouth closed while eating his food falls out. His "eye teeth" continues to injury his jaw or lip with the chewing motion  Weight last was 07/16/22 at 148 lb He averagely is losing 1-5 lbs every month Explained BMI to him  His BMI has varied from 22 to 24.09 Denies constipation or dehydration He is getting information for [facility] (warrant and date)   He reports not having enough strength as he previously had. He is not able to take in enough meats related to not being able to chew meats well. He is getting in carbohydrates but not as much proteins as needed  Reviewed foods to assist in increasing his HDL which averagely is 37-39   Cardiac- doing well. No issues with swallowing pills now but initially after his discharge he had issues with ambulating to his restroom which is less than 10 feet from his living room chair  He reports does not get sad he "gets angry"   Goals Addressed               This Visit's Progress     Patient Stated                  manage dental care needs [facility]) (pt-stated)   Not on track     Care Coordination Interventions: Advised patient to continue to work with [facility] on federal appeal/financial coverage for dental Implants Discussed plans with patient for ongoing care management follow up and provided patient with direct contact information for care management team Listened while [name] [name] to ventilate and review his concerns Support provided Assessed for any update on his Federal appeal for oral service insurance  coverage   Barriers: reports limited dental insurance coverage not covering over $3000      Manage hyperlipidemia (LDL) ([facility]) (pt-stated)   Not on track     Care Coordination Interventions: Provider established cholesterol goals reviewed Reviewed importance of limiting foods high in cholesterol Encouraged foods to assist with managing his LDL like avocado, berries, salmon, beets, kale , spinach, red bell peppers, olive oil, oatmeal, Chicken breasts, tomatoes, halibut, enriched breads, cereals, romaine lettuce, nuts        SDOH assessments and interventions completed:  No     Care Coordination Interventions Activated:  Yes  Care Coordination Interventions:  Yes, provided   Follow up plan: Follow up call scheduled for 09/24/22    Encounter Outcome:  Pt. Visit Completed   Hatice Bubel L. Noelle Penner, RN, BSN, CCM [facility] Care Coordinator Office number [phone]

## 2022-07-29 ENCOUNTER — Encounter: Payer: Self-pay | Admitting: *Deleted

## 2022-07-29 ENCOUNTER — Ambulatory Visit: Payer: Self-pay | Admitting: *Deleted

## 2022-07-29 NOTE — Patient Outreach (Signed)
  Care Coordination   Follow Up Visit Note   07/29/2022  Name: [name] [name]. MRN: [id number] DOB: [birth year]  [name] [name]. is a 76 y.o. year old male who sees Rakes, Doralee Albino, FNP for primary care. I spoke with [name] [name]. by phone today.  What matters to the patients health and wellness today?   Assist with [employer] Process to [employer] Pal[employer] Coverage for Dental Implants.   Goals Addressed               This Visit's Progress     Assist with Federal Appeals Process to Obtain Financial Coverage for Dental Implants. (pt-stated)   On track     Care Coordination Interventions:  Active Listening Utilized.  Emotional Support Provided. Solution-Focused Strategies Employed. Verbalization of Feelings and Frustration Encouraged.  CSW Energy manager from [employer], through the [employer] (# [phone]), to Land[employer] Financial About Patient's Rights.   ~ HIPAA Compliant Message Left on Voicemail, as CSW Continues to Await a Return Call. CSW Energy manager from the [place] of Health and [employer], Instructing CSW to Yahoo[employer] in Writing and Submit to [email or web address].  ~ HIPAA Compliant Email Submitted, as CSW Continues to Await a Response. CSW Collaboration with Representative from [place] of Lehman [employer] and Appeals [phone], or # (# [phone], or # [phone]), to Initiate New Appeal Process.   ~ HIPAA Compliant Message Left on Voicemail, as CSW Continues to Await a Return Call. CSW Collaboration with Representative from [facility] [phone] opt. # 6), to Initiate New Appeal Process.  ~ HIPAA Compliant Message Left on Voicemail, as CSW Awaits a Return Call. CSW Collaboration with (# [phone], Representative from [facility] Claims Department (#  [phone]), to Follow-Up on Request for Medical Records and to Initiate New Appeal Process.    ~ HIPAA Compliant Message Left on Voicemail, as CSW Awaits a Return Call. CSW Collaboration with Dr. [phone], Prosthodontics Practitioner at the [facility] of [facility] of Dentistry [phone] or # [phone]), to Request Written Letter of Medical Necessity.  ~ HIPAA Compliant Message Left on Voicemail, as CSW Awaits a Return Call. Blue [phone].        SDOH assessments and interventions completed:  Yes.  Care Coordination Interventions Activated:  Yes.   Care Coordination Interventions:  Yes, provided.   Follow up plan: Follow up call scheduled for 08/12/2022 at 9:45 am.  Encounter Outcome:  Pt. Visit Completed.   08/14/2022, BSW, MSW, LCSW  Licensed [name]  Mailing [place] [address] [place], [place] Physical Address-[address] [place], [place] Toll Free Main # [phone] Fax # [phone] Cell # [phone] [email or web address]

## 2022-07-29 NOTE — Patient Instructions (Signed)
Visit Information  Thank you for taking time to visit with me today. Please don't hesitate to contact me if I can be of assistance to you.   Following are the goals we discussed today:   Goals Addressed               This Visit's Progress     Assist with Federal Appeals Process to Obtain Financial Coverage for Dental Implants. (pt-stated)   On track     Care Coordination Interventions:  Active Listening Utilized.  Emotional Support Provided. Solution-Focused Strategies Employed. Verbalization of Feelings and Frustration Encouraged.  CSW Energy manager from [employer], through the [employer] (# [phone]), to [employer] About Patient's Rights.   ~ HIPAA Compliant Message Left on Voicemail, as CSW Continues to Await a Return Call. CSW Energy manager from the [place] of [place] and [employer], Instructing CSW to [employer] in Writing and Submit to [email or web address].  ~ HIPAA Compliant Email Submitted, as CSW Continues to Await a Response. CSW Collaboration with Representative from [employer] of [employer] and Appeals [phone], or # [id number], or # [phone]), to Initiate New Appeal Process.   ~ HIPAA Compliant Message Left on Voicemail, as CSW Continues to Await a Return Call. CSW Collaboration with Representative from [facility] [phone] opt. # 6), to Initiate New Appeal Process.  ~ HIPAA Compliant Message Left on Voicemail, as CSW Awaits a Return Call. CSW Collaboration with Laqueta Carina, Representative from [facility] Claims Department (# [phone]), to Follow-Up on Request for Medical Records and to Initiate New Appeal Process.    ~ HIPAA Compliant Message Left on Voicemail, as CSW Awaits a Return Call. CSW Collaboration with Dr. Alvester Chou, Prosthodontics  Practitioner at the [facility] of [facility] of Dentistry [phone] or # [phone]), to Request Written Letter of Medical Necessity.  ~ HIPAA Compliant Message Left on Voicemail, as CSW Awaits a Return Call. [employer].        Our next appointment is by telephone on 08/12/2022 at 9:45 am.  Please call the care guide team at [phone] if you need to cancel or reschedule your appointment.   If you are experiencing a Mental Health or Behavioral Health Crisis or need someone to talk to, please call the Suicide and Crisis Lifeline: 988 call the [place] National Suicide Prevention Lifeline: 972-306-5460 or TTY: 438-074-2493 TTY 872-539-6107) to talk to a trained counselor call 1-800-273-TALK (toll free, 24 hour hotline) go to [facility] [address] [place] [phone]) call the [facility] Crisis Line: 2034885387 call 911  Patient verbalizes understanding of instructions and care plan provided today and agrees to view in MyChart. Active MyChart status and patient understanding of how to access instructions and care plan via MyChart confirmed with patient.     Telephone follow up appointment with care management team member scheduled for:  08/12/2022 at 9:45 am.  [name], BSW, MSW, LCSW  Licensed Clinical Social Worker  [facility]  Mailing [place]. [address], [place], [place] [address] Physical Address-[address] [place], [place] [address] Toll Free Main # [phone] Fax # [phone] Cell # [phone] [email or web address]

## 2022-07-31 ENCOUNTER — Encounter: Payer: Self-pay | Admitting: Physician Assistant

## 2022-08-12 ENCOUNTER — Ambulatory Visit: Payer: Self-pay | Admitting: *Deleted

## 2022-08-12 ENCOUNTER — Encounter: Payer: Self-pay | Admitting: *Deleted

## 2022-08-12 NOTE — Patient Outreach (Signed)
Care Coordination   Follow Up Visit Note   08/12/2022  Name: [name] [name]. MRN: [id number] DOB: 09/01/1946  [name] [name]. is a 76 y.o. year old male who sees Rakes, Connye Burkitt, FNP for primary care. I spoke with [name] [name]. by phone today.  What matters to the patients health and wellness today?  Assist with [employer] Process to [employer] Coverage for Dental Implants.    Goals Addressed               This Visit's Progress     Assist with Federal Appeals Process to Obtain Financial Coverage for Dental Implants. (pt-stated)   On track     Care Coordination Interventions:  Active Listening/Reflection Utilized.  Emotional Support Provided. Task-Centered Solutions Developed. Solution-Focused Strategies Employed. Verbalization of Feelings and Frustration Encouraged.  CSW Environmental education officer from [employer], through the [employer] (# AB-123456789), to [employer] About Patient's Rights.   ~ HIPAA Compliant Messages Left on Voicemail, as CSW Continues to Await a Return Call. CSW Environmental education officer from the [place], Instructing CSW to [employer] in Writing and Submit to [email or web address].  ~ HIPAA Compliant Emails Submitted, as CSW Continues to Await a Response. CSW Collaboration with Representative from [employer] of [employer] and Appeals [phone], or # [id number], or # [phone]), to Initiate New Appeal Process.   ~ HIPAA Compliant Messages Left on Voicemail, as CSW Continues to Await a Return Call. CSW Collaboration with Representative from [facility] [phone] opt. # 6), to Initiate New Appeal Process.  ~ HIPAA Compliant Messages Left on Voicemail, as CSW Continues to Await a Return Call. CSW Collaboration with Unice Bailey,  Representative from [place] (# [phone]), to Follow-Up on Request for Medical Records and to Initiate New Appeal Process.    ~ HIPAA Compliant Messages Left on Voicemail, as CSW Continues to Await a Return Call. CSW Collaboration with Dr. Lajuana Matte, Prosthodontics Practitioner at the [facility] of [facility] of Dentistry [phone] or # [phone]), to Request Written Letter of Medical Necessity.  ~ HIPAA Compliant Messages Left on Voicemail, as CSW Continues to Await a Return Call. CSW Collaboration with Primary Care Provider/Family Nurse Practitioner, Darla Lesches with [facility] [phone]), to Request Written Letter of Medical Necessity.  ~ Date Just Needs to Be Changed From Previous Medical Necessity Letter Written. CSW Collaboration with Cardiologist, Dr. Mertie Moores with [place] [facility] (# [phone]), to Request Written Letter of Medical Necessity.  ~ HIPAA Compliant Message Left on Nurse Call Line, as CSW Continues to Await a Return Call. CSW Collaboration with [place] for [place], Jama Flavors [phone]), to Request Assistance with Appeals Process.  ~ HIPAA Compliant Messages Left on Voicemail, as CSW Continues to Await a Return Call. CSW Environmental education officer from [employer] [phone]), to [employer], and Request a Member Benefit Booklet.  ~ HIPAA Compliant Messages Left on Voicemail, as CSW Continues to Await a Return Call. Member Orthoptist Form Completed by CSW, and Mailed to:   [employer] of [place]: [employer] [address]   [address]   [place], [place] [address]            SDOH assessments and interventions completed:  Yes.  Care Coordination Interventions:  Yes, provided.   Follow up plan: Follow up call scheduled for 08/26/2022 at 9:45  am.  Encounter Outcome:  Pt. Visit Completed.   [name] [name], BSW, MSW, LCSW  Licensed Restaurant manager, fast food Health System  Mailing [place] [address] [place], [place] [address] Physical Address-[address], [place], [place] [address] Toll Free Main # [phone] Fax # [phone] Cell # [phone] [email or web address]

## 2022-08-12 NOTE — Patient Instructions (Signed)
Visit Information  Thank you for taking time to visit with me today. Please don't hesitate to contact me if I can be of assistance to you.   Following are the goals we discussed today:   Goals Addressed               This Visit's Progress     Assist with Federal Appeals Process to Obtain Financial Coverage for Dental Implants. (pt-stated)   On track     Care Coordination Interventions:  Active Listening/Reflection Utilized.  Emotional Support Provided. Task-Centered Solutions Developed. Solution-Focused Strategies Employed. Verbalization of Feelings and Frustration Encouraged.  CSW Energy manager from [employer], through the [employer] (# [phone]), to [employer] About Patient's Rights.   ~ HIPAA Compliant Messages Left on Voicemail, as CSW Continues to Await a Return Call. CSW Energy manager from the [place] of [place] and [employer], Instructing CSW to [employer] in Writing and Submit to [email or web address].  ~ HIPAA Compliant Emails Submitted, as CSW Continues to Await a Response. CSW Collaboration with Representative from [employer] of [employer] and Appeals [phone], or # (# [phone], or # [phone]), to Initiate New Appeal Process.   ~ HIPAA Compliant Messages Left on Voicemail, as CSW Continues to Await a Return Call. CSW Collaboration with Representative from [facility] [phone] opt. # 6), to Initiate New Appeal Process.  ~ HIPAA Compliant Messages Left on Voicemail, as CSW Continues to Await a Return Call. CSW Collaboration with (# [phone], Representative from [facility] Claims Department (# [phone]), to Follow-Up on Request for Medical Records and to Initiate New Appeal Process.    ~ HIPAA Compliant Messages Left on Voicemail, as CSW Continues to Await  a Return Call. CSW Collaboration with Dr. [phone], Prosthodontics Practitioner at the [facility] of [facility] of Dentistry [phone] or # [phone]), to Request Written Letter of Medical Necessity.  ~ HIPAA Compliant Messages Left on Voicemail, as CSW Continues to Await a Return Call. CSW Collaboration with Primary Care Provider/Family Nurse Practitioner, [phone] with [facility] [phone]), to Request Written Letter of Medical Necessity.  ~ Date Just Needs to Be Changed From Previous Medical Necessity Letter Written. CSW Collaboration with Cardiologist, Dr. (# [phone] with [facility] at [facility] (# [phone]), to Request Written Letter of Medical Necessity.  ~ HIPAA Compliant Message Left on Nurse Call Line, as CSW Continues to Await a Return Call. CSW Collaboration with [phone] States [place] for [place], [place] [phone]), to Request Assistance with Appeals Process.  ~ HIPAA Compliant Messages Left on Voicemail, as CSW Continues to Await a Return Call. CSW (# [phone] from Energy manager [phone]), to (# [phone], and Request a Member Benefit Booklet.  ~ HIPAA Compliant Messages Left on Voicemail, as CSW Continues to Await a Return Call. Member [employer] Form Completed by CSW, and Mailed to:   Information systems manager of [place]   Attn: Dental Blue Claims Unit   [address]   [place], [place]            Our next appointment is by telephone on 08/26/2022 at 9:45 am.  Please call the care guide team at [phone] if you need to cancel or reschedule your appointment.   If you are experiencing a Mental Health or Behavioral Health Crisis or need someone  to talk to, please call the Suicide and Crisis Lifeline: 988 call the [place] National Suicide Prevention Lifeline: 970-272-5162 or TTY: (904)525-9529 TTY  6106062459) to talk to a trained counselor call 1-800-273-TALK (toll free, 24 hour hotline) go to [facility] [address], [place] [phone]) call the [facility] Crisis Line: [phone] call 911  Patient verbalizes understanding of instructions and care plan provided today and agrees to view in MyChart. Active MyChart status and patient understanding of how to access instructions and care plan via MyChart confirmed with patient.     Telephone follow up appointment with care management team member scheduled for:  08/26/2022 at 9:45 am.  [name], BSW, MSW, LCSW  Licensed Clinical Social Worker  [facility]  Mailing [place]. [address] [place], [place] [address] Physical Address-[address], [place], [place] [address] Toll Free Main # [phone] Fax # [phone] Cell # [phone] [email or web address]

## 2022-08-16 NOTE — Patient Instructions (Signed)
Visit Information  Thank you for taking time to visit with me today. Please don't hesitate to contact me if I can be of assistance to you.   Following are the goals we discussed today:   Goals Addressed               This Visit's Progress     Patient Stated                  manage dental care needs [facility]) (pt-stated)   Not on track     Care Coordination Interventions: Advised patient to continue to work with [facility] on federal appeal/financial coverage for dental Implants Discussed plans with patient for ongoing care management follow up and provided patient with direct contact information for care management team Listened while Mr Ojeda to ventilate and review his concerns Support provided Assessed for any update on his Federal appeal for oral service insurance coverage   Barriers: reports limited dental insurance coverage not covering over $3000      Manage hyperlipidema (LDL) ([facility]) (pt-stated)   Not on track     Care Coordination Interventions: Provider established cholesterol goals reviewed Reviewed importance of limiting foods high in cholesterol Encouraged foods to assist with managing his LDL like avocado, berries, salmon, beets, kale , spinach, red bell peppers, olive oil, oatmeal, Chicken breasts, tomatoes, halibut, enriched breads, cereals, romaine lettuce, nuts        Our next appointment is by telephone on 07/26/23 at 1130  Please call the care guide team at [phone] if you need to cancel or reschedule your appointment.   If you are experiencing a Mental Health or Behavioral Health Crisis or need someone to talk to, please call the Suicide and Crisis Lifeline: 988 call the [place] National Suicide Prevention Lifeline: 817-075-9744 or TTY: 601-670-5642 TTY (414) 289-5941) to talk to a trained counselor call 1-800-273-TALK (toll free, 24 hour hotline) call the [facility]: (306) 186-4630 call 911   Patient verbalizes understanding of  instructions and care plan provided today and agrees to view in MyChart. Active MyChart status and patient understanding of how to access instructions and care plan via MyChart confirmed with patient.     The patient has been provided with contact information for the care management team and has been advised to call with any health related questions or concerns.   Lucely Leard L. Noelle Penner, RN, BSN, CCM [facility] Care Coordinator Office number [phone]

## 2022-08-26 ENCOUNTER — Encounter: Payer: Self-pay | Admitting: *Deleted

## 2022-08-26 ENCOUNTER — Ambulatory Visit: Payer: Self-pay | Admitting: *Deleted

## 2022-08-26 NOTE — Patient Outreach (Signed)
Care Coordination   Follow Up Visit Note   08/26/2022  Name: [name] [name]. MRN: [id number] DOB: 03/24/1946  [name] [name]. is a 76 y.o. year old male who sees Rakes, Doralee Albino, FNP for primary care. I spoke with [name] [name]. by phone today.  What matters to the patients health and wellness today?   Assist with [employer] Process to [employer] Coverage for Dental Implants.   Goals Addressed               This Visit's Progress     Assist with Federal Appeals Process to Obtain Financial Coverage for Dental Implants. (pt-stated)   On track     Care Coordination Interventions:  Active Listening/Reflection Utilized.  Emotional Support Provided. Task-Centered Solutions Developed. Solution-Focused Strategies Employed. Verbalization of Feelings and Frustration Encouraged.  Self-Enrollment in Domestic Violence Support Group Emphasized, From the List Provided. CSW Energy manager from [employer], through the [employer] (# [phone]), to [employer] About Patient's Rights.   ~ HIPAA Compliant Messages Left on Voicemail, as CSW Continues to Await a Return Call. CSW Energy manager from the [place] of [place] and [employer], Instructing CSW to [employer] in Writing and Submit to [email or web address].  ~ HIPAA Compliant Emails Submitted, as CSW Continues to Await a Response. CSW Collaboration with Representative from [employer] of [employer] and Appeals [phone], or # (# [phone], or # [phone]), to Initiate New Appeal Process.   ~ HIPAA Compliant Messages Left on Voicemail, as CSW Continues to Await a Return Call. CSW Collaboration with Representative from [facility] [phone] opt. # 6), to Initiate New Appeal Process.  ~ Appeal Process Documentation and Application  Submitted by CSW. CSW Collaboration with (# [phone], Representative from [facility] Claims Department (# [phone]), to Follow-Up on Request for Medical Records and to Initiate New Appeal Process.    ~ HIPAA Compliant Messages Left on Voicemail, as CSW Continues to Await a Return Call. CSW Collaboration with Dr. [phone], Prosthodontics Practitioner at the [facility] of [facility]istry [phone] or # [phone]), to Request Written Letter of Medical Necessity.  ~ Written Letter of Medical Necessity Obtained. CSW Collaboration with Primary Care Provider/Family Nurse Practitioner, [phone] with [facility] [phone]), to Request Written Letter of Medical Necessity.  ~ Written Letter of Medical Necessity Obtained. CSW Collaboration with Cardiologist, Dr. (# [phone] with [facility] at [facility] (# [phone]), to Request Written Letter of Medical Necessity. ~ HIPAA Compliant Message Left on Nurse Call Line, as CSW Continues to Await a Return Call. CSW Collaboration with [phone] States [place] for [place], [place] [phone]), to Request Assistance with Appeals Process.  ~ HIPAA Compliant Messages Left on Voicemail, as CSW Continues to Await a Return Call. CSW (# [phone] from Energy manager [phone]), to (# [phone], and Request a Member Benefit Booklet.  ~ HIPAA Compliant Messages Left on Voicemail, as CSW Continues to Await a Return Call. Member [employer] Form Completed by CSW, and Mailed to:   Information systems manager of [employer]   Attn: [employer] Unit   [address]   [place], [place]            SDOH assessments and interventions completed:  Yes.  Care Coordination Interventions:  Yes, provided.  Follow up plan: Follow up call scheduled for 09/09/2022 at 9:00 am.  Encounter  Outcome:  Pt. Visit Completed.   [name] [name], BSW, MSW, LCSW  Licensed Restaurant manager, fast food Health System  Mailing [place] [address], [place], [place] [address] Physical Address-[address], [place], [place] [address] Toll Free Main # [phone] Fax # [phone] Cell # [phone] [name]

## 2022-08-26 NOTE — Patient Instructions (Signed)
Visit Information  Thank you for taking time to visit with me today. Please don't hesitate to contact me if I can be of assistance to you.   Following are the goals we discussed today:   Goals Addressed               This Visit's Progress     Assist with Federal Appeals Process to Obtain Financial Coverage for Dental Implants. (pt-stated)   On track     Care Coordination Interventions:  Active Listening/Reflection Utilized.  Emotional Support Provided. Task-Centered Solutions Developed. Solution-Focused Strategies Employed. Verbalization of Feelings and Frustration Encouraged.  Self-Enrollment in Domestic Violence Support Group Emphasized, From the List Provided. CSW Energy manager from [employer], through the [employer] (# [phone]), to [employer] About Patient's Rights.   ~ HIPAA Compliant Messages Left on Voicemail, as CSW Continues to Await a Return Call. CSW Energy manager from the [place] of [place] and [employer], Instructing CSW to [employer] in Writing and Submit to [email or web address].  ~ HIPAA Compliant Emails Submitted, as CSW Continues to Await a Response. CSW Collaboration with Representative from [employer] of [employer] and Appeals [phone], or # [id number], or # [phone]), to Initiate New Appeal Process.   ~ HIPAA Compliant Messages Left on Voicemail, as CSW Continues to Await a Return Call. CSW Collaboration with Representative from [facility] [phone] opt. # 6), to Initiate New Appeal Process.  ~ Appeal Process Documentation and Application Submitted by CSW. CSW Collaboration with Laqueta Carina, Representative from [facility] Claims Department (# [phone]), to Follow-Up on Request for Medical Records and to Initiate New Appeal Process.    ~  HIPAA Compliant Messages Left on Voicemail, as CSW Continues to Await a Return Call. CSW Collaboration with Dr. Alvester Chou, Prosthodontics Practitioner at the [facility] of [facility] of Dentistry [phone] or # [phone]), to Request Written Letter of Medical Necessity.  ~ Written Letter of Medical Necessity Obtained. CSW Collaboration with Primary Care Provider/Family Nurse Practitioner, Gilford Silvius with [facility] [phone]), to Request Written Letter of Medical Necessity.  ~ Written Letter of Medical Necessity Obtained. CSW Collaboration with Cardiologist, Dr. Kristeen Miss with [facility] at [facility] (# [phone]), to Request Written Letter of Medical Necessity. ~ HIPAA Compliant Message Left on Nurse Call Line, as CSW Continues to Await a Return Call. CSW Collaboration with [place] States Dan Maker for [place], [place]ana [place] [phone]), to Request Assistance with Appeals Process.  ~ HIPAA Compliant Messages Left on Voicemail, as CSW Continues to Await a Return Call. CSW Energy manager from [employer] [phone]), to [employer], and Request a Member Benefit Booklet.  ~ HIPAA Compliant Messages Left on Voicemail, as CSW Continues to Await a Return Call. Member Information systems manager Form Completed by CSW, and Mailed to:   [employer] of [place]   Attn: Dental Blue Claims Unit   [address]   [place], [place] [address]            Our next appointment is by telephone on 09/09/2022 at 9:00 am.  Please call the care guide team at [phone] if you need to cancel or reschedule your appointment.   If you are experiencing a Mental Health or Behavioral Health Crisis or need someone to talk to, please call the Suicide and Crisis Lifeline:  988 call the [place] National Suicide Prevention Lifeline: (223) 146-3898 or TTY: 5021537011 TTY  325-476-2214) to talk to a trained counselor call 1-800-273-TALK (toll free, 24 hour hotline) go to [facility] [address] [place] [phone]) call the [facility] Crisis Line: 225-346-6882 call 911  Patient verbalizes understanding of instructions and care plan provided today and agrees to view in MyChart. Active MyChart status and patient understanding of how to access instructions and care plan via MyChart confirmed with patient.     Telephone follow up appointment with care management team member scheduled for:  09/09/2022 at 9:00 am.  [name], BSW, MSW, LCSW  Licensed Clinical Social Worker  [facility]  Mailing [place]. [address] [place], [place] [address] Physical Address-[address], [place], [place] [address] Toll Free Main # [phone] Fax # [phone] Cell # [phone] [email or web address]

## 2022-08-27 ENCOUNTER — Encounter: Payer: Self-pay | Admitting: *Deleted

## 2022-09-09 ENCOUNTER — Encounter: Payer: Self-pay | Admitting: *Deleted

## 2022-09-09 ENCOUNTER — Ambulatory Visit: Payer: Self-pay | Admitting: *Deleted
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
## 2022-09-19 ENCOUNTER — Encounter: Payer: Self-pay | Admitting: *Deleted

## 2022-09-19 ENCOUNTER — Ambulatory Visit: Payer: Self-pay | Admitting: *Deleted

## 2022-09-19 NOTE — Patient Outreach (Signed)
Care Coordination   Follow Up Visit Note   09/19/2022  Name: [name] [name]. MRN: [id number] DOB: 07/13/1946  [name] [name]. is a 77 y.o. year old male who sees Rakes, Connye Burkitt, FNP for primary care. I spoke with [name] [name]. by phone today.  What matters to the patients health and wellness today?  Assist with Aetna Process to [employer] Coverage for Dental Implants.     Goals Addressed               This Visit's Progress     Assist with Federal Appeals Process to Obtain Financial Coverage for Dental Implants. (pt-stated)   On track     Care Coordination Interventions:  Active Listening/Reflection Utilized.  Verbalization of Feelings Encouraged. Emotional Support Provided. Task-Centered Solutions Modified. Solution-Focused Strategies Employed. Client-Centered Therapy Initiated. CSW Environmental education officer from [employer], through the [employer] (# [phone]), to [employer] About Patient's Rights.  ~ HIPAA Compliant Messages Left on Voicemail, as CSW Continues to Await a Return Call. CSW Environmental education officer from the [place], Instructing CSW to [employer] in Writing and Submit to [email or web address]. ~ HIPAA Compliant Emails Submitted, as CSW Continues to Await a Response. CSW Collaboration with Representative from [employer] of [employer] and Appeals [phone], or # [id number], or # [phone]), to Initiate New Appeal Process.   ~ HIPAA Compliant Messages Left on Voicemail, as CSW Continues to Await a Return Call. CSW Collaboration with Representative from [facility] [phone] opt. # 6), to Initiate New Appeal Process.  ~ Appeal Process Documentation and Application Submitted by CSW. CSW Collaboration with Unice Bailey, Representative  from [place] (# [phone]), to Follow-Up on Request for Medical Records and to Initiate New Appeal Process.   ~ HIPAA Compliant Messages Left on Voicemail, as CSW Continues to Await a Return Call. Confirmed Receipt of Letter of Medical Necessity from Dr. Lajuana Matte, Prosthodontics Practitioner at the [facility] of Dentistry [phone] or # [phone]), Mailed on 08/26/2022. CSW Collaboration with [place] for [place], Jama Flavors [phone]), to Request Assistance with Appeals Process. ~ HIPAA Compliant Messages Left on Voicemail, as CSW Continues to Await a Return Call. CSW Environmental education officer from [employer] [phone]), to [employer], and Request a Member Benefit Booklet. ~ HIPAA Compliant Messages Left on Voicemail, as CSW Continues to Await a Return Call. Member Orthoptist Form Completed by CSW, and Mailed to:   [employer] of [place] Claims Unit   [address]   [place], [place] [address]  ~ [facility] Appeal Paperwork Received on 09/18/2022. Please Contact CSW Directly (# [phone]), If You Need Assistance Completing Blue Cross Western & Southern Financial.              SDOH assessments and interventions completed:  Yes.  Care Coordination Interventions:  Yes, provided.   Follow up plan: Follow up call scheduled for 09/23/2022 at 9:15 am.  Encounter Outcome:  Pt. Visit Completed.   Nat Christen, BSW, MSW, LCSW  Licensed Education officer, environmental Health System  Mailing [place] [address], [place], [place] [address] Physical Address-[address], [place],  [address] Toll Free Main # [phone] Fax #  [phone] Cell # [email or web address]

## 2022-09-19 NOTE — Patient Instructions (Signed)
Visit Information  Thank you for taking time to visit with me today. Please don't hesitate to contact me if I can be of assistance to you.   Following are the goals we discussed today:   Goals Addressed               This Visit's Progress     Assist with Federal Appeals Process to Obtain Financial Coverage for Dental Implants. (pt-stated)   On track     Care Coordination Interventions:  Active Listening/Reflection Utilized.  Verbalization of Feelings Encouraged. Emotional Support Provided. Task-Centered Solutions Modified. Solution-Focused Strategies Employed. Client-Centered Therapy Initiated. CSW Environmental education officer from [employer], through the [employer] (# [phone]), to [employer] About Patient's Rights.  ~ HIPAA Compliant Messages Left on Voicemail, as CSW Continues to Await a Return Call. CSW Environmental education officer from the [place], Instructing CSW to [employer] in Writing and Submit to [email or web address]. ~ HIPAA Compliant Emails Submitted, as CSW Continues to Await a Response. CSW Collaboration with Representative from [employer] of [employer] and Appeals [phone], or # [id number], or # [phone]), to Initiate New Appeal Process.   ~ HIPAA Compliant Messages Left on Voicemail, as CSW Continues to Await a Return Call. CSW Collaboration with Representative from [facility] [phone] opt. # 6), to Initiate New Appeal Process.  ~ Appeal Process Documentation and Application Submitted by CSW. CSW Collaboration with Unice Bailey, Representative from [place] (# [phone]), to Follow-Up on Request for Medical Records and to Initiate New Appeal Process.   ~ HIPAA Compliant Messages Left on Voicemail, as CSW Continues to Await a  Return Call. Confirmed Receipt of Letter of Medical Necessity from Dr. Lajuana Matte, Prosthodontics Practitioner at the [facility] of Dentistry [phone] or # [phone]), Mailed on 08/26/2022. CSW Collaboration with [place] for [place], Jama Flavors [phone]), to Request Assistance with Appeals Process. ~ HIPAA Compliant Messages Left on Voicemail, as CSW Continues to Await a Return Call. CSW Environmental education officer from [employer] [phone]), to [employer], and Request a Member Benefit Booklet. ~ HIPAA Compliant Messages Left on Voicemail, as CSW Continues to Await a Return Call. Member Orthoptist Form Completed by CSW, and Mailed to:   [employer] of [place] Claims Unit   [address]   [place], [place] [address]  ~ [facility] Appeal Paperwork Received on 09/18/2022. Please Contact CSW Directly (# [phone]), If You Need Assistance Completing Blue Cross Western & Southern Financial.              Our next appointment is by telephone on 09/23/2022 at 9:15 am.  Please call the care guide team at [phone] if you need to cancel or reschedule your appointment.   If you are experiencing a Mental Health or [place] or need someone to talk to, please call the Suicide and Crisis Lifeline: 988 call the [place] National Suicide Prevention Lifeline: (947)786-2478 or TTY: (706) 245-7525 TTY 563-355-5062) to talk to a trained counselor call 1-800-273-TALK (toll free, 24 hour hotline) go to [facility] [address], [place] [phone]) call the [place]: [phone] call 911  Patient verbalizes understanding of instructions and care plan provided today and agrees to view in  MyChart. Active MyChart status and patient understanding of how to access  instructions and care plan via MyChart confirmed with patient.     Telephone follow up appointment with care management team member scheduled for:  09/23/2022 at 9:15 am.    [name] [name], BSW, MSW, [place]  Licensed Clinical Social Worker  [name] [name]  Mailing [place]. [address], [place], [name] [name] Physical Address[address] [place], [place] [address] Toll Free Main # [phone] Fax # [phone] Cell # [phone] [email or web address]

## 2022-09-22 ENCOUNTER — Telehealth: Payer: Self-pay | Admitting: *Deleted

## 2022-09-22 NOTE — Progress Notes (Signed)
  Care Coordination Note  09/22/2022 Name: [name] [name]. MRN: [id number] DOB: 03-14-1946  [name] [name]. is a 77 y.o. year old male who is a primary care patient of Rakes, Connye Burkitt, FNP and is actively engaged with the care management team. I reached out to [name] [name]. by phone today to assist with re-scheduling a follow up visit with the RN Case Manager  Follow up plan: Telephone appointment with care management team member scheduled for:09/24/22  [place]: [phone]

## 2022-09-22 NOTE — Progress Notes (Signed)
  Care Coordination Note  09/22/2022 Name: [name] [name]. MRN: [id number] DOB: 10/04/1945  [name] [name]. is a 77 y.o. year old male who is a primary care patient of Rakes, Connye Burkitt, FNP and is actively engaged with the care management team. I reached out to [name] [name]. by phone today to assist with re-scheduling a follow up visit with the RN Case Manager  Follow up plan: Unsuccessful telephone outreach attempt made. A HIPAA compliant phone message was left for the patient providing contact information and requesting a return call.  [place]  Direct Dial: [phone]

## 2022-09-23 ENCOUNTER — Encounter: Payer: Self-pay | Admitting: *Deleted

## 2022-09-23 ENCOUNTER — Ambulatory Visit: Payer: Self-pay | Admitting: *Deleted
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
## 2022-09-24 ENCOUNTER — Encounter: Payer: Medicare Other | Admitting: *Deleted

## 2022-09-24 ENCOUNTER — Encounter: Payer: Self-pay | Admitting: *Deleted

## 2022-09-24 ENCOUNTER — Telehealth: Payer: Self-pay | Admitting: *Deleted

## 2022-09-24 NOTE — Patient Outreach (Signed)
  Care Coordination   Follow Up Visit Note   09/24/2022 Name: [name] [name]. MRN: [id number] DOB: 11/10/1945  [name] [name]. is a 77 y.o. year old male who sees Rakes, Connye Burkitt, FNP for primary care. I spoke with  [name] [name]. by phone today.  What matters to the patients health and wellness today?  COMPLETING HIS DENTAL PROCEDURES.    Goals Addressed               This Visit's Progress     Patient Stated     manage dental care needs [facility]) (pt-stated)        Care Coordination Interventions: Patient had 9 extractions about a month ago and is healing well. He will have an additional 5 extractions coming up.  Pt requested guidance on taking left over antibiotics and prednisone before the procedure. ADVISED HE SHOULD ONLY TAKE MEDS WHEN PRESCRIBED FOR SPECIFIC REASONS. ANTIBIOTICS ARE NOT ALWAYS NECESSARY. IF ANTIBIOTICS ARE PRESCRIBED ALWAYS TAKE THE ENTIRE COURSE OF THE PRESCRIPTION. ADVISED TO REQUEST PAIN MED IF HIS PAIN FROM THE PREVIOUS SURGERY WAS SEVERE AND NOT RELIEVED BY APAP. SUGGESTED HE ASK FOR TRAMADOL FOR A SHORT PERIOD OF TIME.          SDOH assessments and interventions completed:  Yes  PREVIOUSLY ASSESSED.  Care Coordination Interventions:  Yes, provided   Follow up plan: Follow up call scheduled for 1 MONTH.    Encounter Outcome:  Pt. Visit Completed   Kayleen Memos C. Myrtie Neither, MSN, [facility] Gerontological Nurse Practitioner [facility] Care Management [phone]

## 2022-10-01 DIAGNOSIS — K080 Exfoliation of teeth due to systemic causes: Secondary | ICD-10-CM | POA: Diagnosis not present

## 2022-10-07 ENCOUNTER — Encounter: Payer: Self-pay | Admitting: *Deleted

## 2022-10-07 ENCOUNTER — Ambulatory Visit: Payer: Self-pay | Admitting: *Deleted

## 2022-10-07 DIAGNOSIS — K080 Exfoliation of teeth due to systemic causes: Secondary | ICD-10-CM | POA: Diagnosis not present

## 2022-10-07 NOTE — Patient Outreach (Signed)
  Care Coordination   Follow Up Visit Note   10/07/2022  Name: [name] [name]. MRN: [id number] DOB: Dec 09, 1945  [name] [name]. is a 77 y.o. year old male who sees Rakes, Connye Burkitt, FNP for primary care. I spoke with [name] [name]. by phone today.  What matters to the patients health and wellness today?   Assist with [employer] Process to [employer] Coverage for Dental Implants.   Goals Addressed               This Visit's Progress     COMPLETED: Assist with Federal Appeals Process to Obtain Financial Coverage for Dental Implants. (pt-stated)   On track     Care Coordination Interventions:  Active Listening & Reflection Utilized.  Verbalization of Feelings Encouraged. Emotional Support Provided. Problem-Solving Interventions Implemented. Task-Centered Solutions Activated. Solution-Focused Strategies Employed. Acceptance & Commitment Therapy Introduced. Client-Centered Therapy Initiated. Cognitive Behavioral Therapy Performed. Please Contact [employer]: [place] [phone]), to [place] through [place], As Necessary. Please Continue to Consider Self-Enrollment in Domestic Violence Support Group of Interest, from List Provided. Please Contact CSW Directly (# [phone]), if You Change Your Mind About Wanting to Continue to Pursue Appeal Process for Denial of Dental Coverage through [facility]. Congratulations on Having All Remaining Teeth Extracted & Comfortably Placed in Permanent Dentures.              SDOH assessments and interventions completed:  Yes.  Care Coordination Interventions:  Yes, provided.   Follow up plan: No further intervention required.   Encounter Outcome:  Pt. Visit Completed.   Nat Christen, BSW, MSW, LCSW  Licensed Education officer, environmental Health System   Mailing [place] [address] [place], [place] [address] Physical Address-[address] [place], [place] [address] Toll Free Main # [phone] Fax # [phone] Cell # [phone] [email or web address]

## 2022-10-07 NOTE — Patient Instructions (Signed)
Visit Information  Thank you for taking time to visit with me today. Please don't hesitate to contact me if I can be of assistance to you.   Following are the goals we discussed today:   Goals Addressed               This Visit's Progress     COMPLETED: Assist with Federal Appeals Process to [employer] Coverage for Dental Implants. (pt-stated)   On track     Care Coordination Interventions:  Active Listening & Reflection Utilized.  Verbalization of Feelings Encouraged. Emotional Support Provided. Problem-Solving Interventions Implemented. Task-Centered Solutions Activated. Solution-Focused Strategies Employed. Acceptance & Commitment Therapy Introduced. Client-Centered Therapy Initiated. Cognitive Behavioral Therapy Performed. Please Contact [employer]: [place] [phone]), to [place] through [place], As Necessary. Please Continue to Consider Self-Enrollment in Domestic Violence Support Group of Interest, from List Provided. Please Contact CSW Directly (# [phone]), if You Change Your Mind About Wanting to Continue to Pursue Appeal Process for Denial of Dental Coverage through [facility]. Congratulations on Having All Remaining Teeth Extracted & Comfortably Placed in Permanent Dentures.              Please call the care guide team at [phone] if you need to cancel or reschedule your appointment.   If you are experiencing a Mental Health or [place] or need someone to talk to, please call the Suicide and Crisis Lifeline: 988 call the [place] National Suicide Prevention Lifeline: 7174354300 or TTY: 978-605-4846 TTY (850)718-8764) to talk to a trained counselor call 1-800-273-TALK (toll free, 24 hour hotline) go to [facility] [address] [place] [phone]) call the [place]: [phone] call 911  Patient verbalizes understanding of instructions and care plan provided today and agrees to view in [place]. Active MyChart status and patient understanding of how to access instructions and care plan via MyChart confirmed with patient.     No further follow up required.  Nat Christen, BSW, MSW, LCSW  Licensed Education officer, environmental Health System  Mailing [place] [address], [place], [place] [address] Physical Address[address], [place], [place] [address] Toll Free Main # [phone] Fax # [phone] Cell # [phone] [email or web address]

## 2022-10-22 ENCOUNTER — Ambulatory Visit: Payer: Self-pay | Admitting: *Deleted

## 2022-10-22 NOTE — Patient Outreach (Signed)
  Care Coordination   Follow Up Visit Note   10/22/2022 Name: [name] [name]. MRN: [id number] DOB: [birth year]  [name] [name]. is a 77 y.o. year old male who sees Rakes, Connye Burkitt, FNP for primary care. I spoke with  [name] [name]. by phone today.  What matters to the patients health and wellness today?  Bottom dentures not fitting as well as he would like but his is managing He reports adjusting them himself.  Has gained 3-4 pounds  Hyperlipidemia continues with use of sardines to increase HDL  Pending pcp follow up in May 2024   Goals Addressed               This Visit's Progress     Patient Stated     manage dental care needs [facility]) (pt-stated)   On track     Care Coordination Interventions: Confirmed patient had 9 extractions about a month ago and is healing well. Plus an additional 5 extractions completed Confirmed he had his teeth pulled (including canines) Healing good but remains sore with assessment With assessment, confirmed trouble with lower dentures now and trouble with eating as not fitting right- rubs on the inside of the jaw when he eats. Pain level 3  Confirmed a recent weight gaining & improvement related to depression symptoms Discussed transportation option via his blue cross coverage when he discussed difficulty with driving to [place] 45 miles one way to the present dental provider. Assessed if transportation assistance needed  Confirmed he is unwilling to have further oral surgery as offered He reports not wanting more oral surgery Encouraged him to outreach to RN CM if any further assistance is needed       Manage hyperlipidema (HDL) ([facility]) (pt-stated)   On track     Care Coordination Interventions: Provider established cholesterol goals reviewed Reviewed importance of limiting foods high in cholesterol Confirms he continues to take in foods to assist with increasing his LDL like sardines, avocado, berries, salmon, beets, kale , spinach, red bell  peppers, olive oil, oatmeal, Chicken breasts, tomatoes, halibut, enriched breads, cereals, romaine lettuce, nuts Reviewed his last lipid results in 2023 HDL was 39 (needs to be > 39)  Education sent via my chart on HDL         SDOH assessments and interventions completed:  Yes  SDOH Interventions Today    Flowsheet Row Most Recent Value  SDOH Interventions   Food Insecurity Interventions Intervention Not Indicated  Housing Interventions Intervention Not Indicated  Transportation Interventions Intervention Not Indicated  Utilities Interventions Intervention Not Indicated  Financial Strain Interventions Intervention Not Indicated  Stress Interventions Intervention Not Indicated  Social Connections Interventions Intervention Not Indicated        Care Coordination Interventions:  Yes, provided     Follow up plan: Follow up call scheduled for 02/16/23    Encounter Outcome:  Pt. Visit Completed   Rashay Barnette L. Lavina Hamman, RN, BSN, [place] Coordinator Office number [phone]

## 2022-10-22 NOTE — Patient Instructions (Addendum)
Visit Information  Thank you for taking time to visit with me today. Please don't hesitate to contact me if I can be of assistance to you.   Following are the goals we discussed today:   Goals Addressed               This Visit's Progress     Patient Stated     manage dental care needs [facility]) (pt-stated)   On track     Care Coordination Interventions: Confirmed patient had 9 extractions about a month ago and is healing well. Plus an additional 5 extractions completed Confirmed he had his teeth pulled (including canines) Healing good but remains sore with assessment With assessment, confirmed trouble with lower dentures now and trouble with eating as not fitting right- rubs on the inside of the jaw when he eats. Pain level 3  Confirmed a recent weight gaining & improvement related to depression symptoms Discussed transportation option via his blue cross coverage when he discussed difficulty with driving to [place] 45 miles one way to the present dental provider. Assessed if transportation assistance needed  Confirmed he is unwilling to have further oral surgery as offered He reports not wanting more oral surgery Encouraged him to outreach to RN CM if any further assistance is needed       Manage hyperlipidema (HDL) ([facility]) (pt-stated)   On track     Care Coordination Interventions: Provider established cholesterol goals reviewed Reviewed importance of limiting foods high in cholesterol Confirms he continues to take in foods to assist with increasing his LDL like sardines, avocado, berries, salmon, beets, kale , spinach, red bell peppers, olive oil, oatmeal, Chicken breasts, tomatoes, halibut, enriched breads, cereals, romaine lettuce, nuts Reviewed his last lipid results in 2023 HDL was 39 (needs to be > 39)  Education sent via my chart on HDL         Our next appointment is by telephone on 02/16/23 at 1000  Please call the care guide team at [phone] if you need to cancel or  reschedule your appointment.   If you are experiencing a Mental Health or [place] or need someone to talk to, please call the Suicide and Crisis Lifeline: 988 call the [place] National Suicide Prevention Lifeline: 250-086-4310 or TTY: 719-370-0309 TTY 478-208-6398) to talk to a trained counselor call 1-800-273-TALK (toll free, 24 hour hotline) call the [place]: [phone] call 911   Patient verbalizes understanding of instructions and care plan provided today and agrees to view in [place]. Active MyChart status and patient understanding of how to access instructions and care plan via MyChart confirmed with patient.     The patient has been provided with contact information for the care management team and has been advised to call with any health related questions or concerns.    Xochil Shanker L. Lavina Hamman, RN, BSN, [place] Coordinator Office number [phone]

## 2023-01-16 ENCOUNTER — Other Ambulatory Visit: Payer: Self-pay | Admitting: Cardiovascular Disease

## 2023-02-13 ENCOUNTER — Ambulatory Visit (INDEPENDENT_AMBULATORY_CARE_PROVIDER_SITE_OTHER): Payer: Medicare Other

## 2023-02-13 VITALS — Wt 148.0 lb

## 2023-02-13 DIAGNOSIS — Z0000 Encounter for general adult medical examination without abnormal findings: Secondary | ICD-10-CM

## 2023-02-13 NOTE — Progress Notes (Unsigned)
Subjective:   [name] [name]. is a 77 y.o. male who presents for Medicare Annual/Subsequent preventive examination.  Review of Systems    I connected with  [name] [name]. on 02/13/23 by a audio enabled telemedicine application and verified that I am speaking with the correct person using two identifiers.  Patient Location: Home  Provider Location: Home Office  I discussed the limitations of evaluation and management by telemedicine. The patient expressed understanding and agreed to proceed.  Cardiac Risk Factors include: advanced age (>28men, >51 women)     Objective:    Today's Vitals   02/13/23 0815  Weight: 148 lb (67.1 kg)   Body mass index is 23.89 kg/m.     02/13/2023    8:24 AM 06/05/2022    3:26 PM 02/11/2022    8:26 AM 07/13/2021    3:06 AM 04/23/2020    7:15 AM 04/17/2020   10:17 AM 04/03/2020   11:50 AM  Advanced Directives  Does Patient Have a Medical Advance Directive? Yes Yes Yes No;Yes Yes Yes Yes  Type of Estate agent of [place];Living will Healthcare Power of [place];Living will Healthcare Power of [place];Living will Healthcare Power of Attorney Living will Living will Healthcare Power of [place];Living will  Does patient want to make changes to medical advance directive? No - Patient declined No - Patient declined  No - Patient declined  No - Patient declined No - Patient declined  Copy of Healthcare Power of Attorney in Chart? Yes - validated most recent copy scanned in chart (See row information) No - copy requested No - copy requested  Yes - validated most recent copy scanned in chart (See row information)  Yes - validated most recent copy scanned in chart (See row information)    Current Medications (verified) Outpatient Encounter Medications as of 02/13/2023  Medication Sig   aspirin 81 MG chewable tablet Chew 81 mg by mouth daily.   cholecalciferol (VITAMIN D3) 25 MCG (1000 UNIT) tablet Take 1,000 Units by mouth daily.     losartan (COZAAR) 50 MG tablet TAKE ONE TABLET ONCE DAILY   metoprolol succinate (TOPROL XL) 25 MG 24 hr tablet Take 0.5 tablets (12.5 mg total) by mouth daily.   nitroGLYCERIN (NITROSTAT) 0.4 MG SL tablet Place 1 tablet (0.4 mg total) under the tongue every 5 (five) minutes as needed for chest pain.   rosuvastatin (CRESTOR) 20 MG tablet Take 1 tablet (20 mg total) by mouth daily.   tadalafil (CIALIS) 10 MG tablet TAKE 1 TO 2 TABLETS AS DIRECTED AS NEEDED   triamcinolone cream (KENALOG) 0.1 % Apply 1 application topically 2 (two) times daily.   No facility-administered encounter medications on file as of 02/13/2023.    Allergies (verified) Carvedilol, Lipitor [atorvastatin], and Plavix [clopidogrel]   History: Past Medical History:  Diagnosis Date   Coronary artery disease    Erectile dysfunction    HFrEF (heart failure with reduced ejection fraction) (HCC)    Echocardiogram 07/14/21:  EF 25, global HK, inf HK worse, Gr 2 DD, normal RVSF, mild to mod MR, AV sclerosis w/o AS Echocardiogram 2/23: EF 25-30, global HK, GLS -12.9, normal RVSF, mild to mod MR, AV sclerosis w/o AS    History of hiatal hernia    a lone time ago   Hyperlipidemia    [place] fracture [facility]) 03/22/2020   Myocardial infarction (HCC)    x 2   Polio    Past Surgical History:  Procedure Laterality Date  APPENDECTOMY     CARDIAC CATHETERIZATION     has two stents placed   CERVICAL SPINE SURGERY     c3-4, 4-5,  5-6, 8 screws and plates   CORONARY STENT INTERVENTION N/A 07/15/2021   Procedure: CORONARY STENT INTERVENTION;  Surgeon: Runell Gess, MD;  Location: [facility];  Service: Cardiovascular;  Laterality: N/A;   CORONARY STENT PLACEMENT     forein body removal     metal sharp removed from right leg   LEFT HEART CATH AND CORONARY ANGIOGRAPHY N/A 11/15/2018   Procedure: LEFT HEART CATH AND CORONARY ANGIOGRAPHY;  Surgeon: Yvonne Kendall, MD;  Location: [facility];  Service:  Cardiovascular;  Laterality: N/A;   LEFT HEART CATH AND CORONARY ANGIOGRAPHY N/A 07/15/2021   Procedure: LEFT HEART CATH AND CORONARY ANGIOGRAPHY;  Surgeon: Runell Gess, MD;  Location: [facility];  Service: Cardiovascular;  Laterality: N/A;   MANDIBULAR HARDWARE REMOVAL Bilateral 04/23/2020   Procedure: MANDIBULAR HARDWARE REMOVAL;  Surgeon: Serena Colonel, MD;  Location: [facility];  Service: ENT;  Laterality: Bilateral;   ORIF MANDIBULAR FRACTURE N/A 03/14/2020   Procedure: OPEN REDUCTION INTERNAL FIXATION (ORIF) MID  FACE FRACTURE, MANDIBULAR FIXATION MODIFIED ARCH BARS;  Surgeon: Serena Colonel, MD;  Location: [facility] OR;  Service: ENT;  Laterality: N/A;   SCAR REVISION N/A 04/23/2020   Procedure: SCAR REVISION/ Upper Lip Repair;  Surgeon: Serena Colonel, MD;  Location: [facility];  Service: ENT;  Laterality: N/A;   Family History  Problem Relation Age of Onset   Diabetes Mother    Heart disease Mother    Heart disease Father    Social History   Socioeconomic History   Marital status: Divorced    Spouse name: Not on file   Number of children: 2   Years of education: 12   Highest education level: 12th grade  Occupational History   Occupation: retired    Comment: farmer  Tobacco Use   Smoking status: Former    Types: Cigarettes    Passive exposure: Past   Smokeless tobacco: Never  Vaping Use   Vaping Use: Never used  Substance and Sexual Activity   Alcohol use: Yes    Alcohol/week: 21.0 standard drinks of alcohol    Types: 21 Cans of beer per week    Comment: 2 beers daily   Drug use: No   Sexual activity: Not on file  Other Topics Concern   Not on file  Social History Narrative   Mr. Odoms has a restraining order against one of his sons.   Social Determinants of Health   Financial Resource Strain: Low Risk  (02/13/2023)   Overall Financial Resource Strain (CARDIA)    Difficulty of Paying Living Expenses: Not hard at all  Food  Insecurity: No Food Insecurity (02/13/2023)   Hunger Vital Sign    Worried About Running Out of Food in the Last Year: Never true    Ran Out of Food in the Last Year: Never true  Transportation Needs: No Transportation Needs (02/13/2023)   PRAPARE - Administrator, Civil Service (Medical): No    Lack of Transportation (Non-Medical): No  Physical Activity: Insufficiently Active (02/13/2023)   Exercise Vital Sign    Days of Exercise per Week: 3 days    Minutes of Exercise per Session: 30 min  Stress: No Stress Concern Present (10/22/2022)   [employer] of Occupational Health - Occupational Stress Questionnaire  Feeling of Stress : Only a little  Social Connections: Socially Integrated (02/13/2023)   Social Connection and Isolation Panel [NHANES]    Frequency of Communication with Friends and Family: More than three times a week    Frequency of Social Gatherings with Friends and Family: More than three times a week    Attends Religious Services: More than 4 times per year    Active Member of [employer] or Organizations: Yes    Attends Engineer, structural: More than 4 times per year    Marital Status: Married    Tobacco Counseling Counseling given: Yes   Clinical Intake:  Pre-visit preparation completed: Yes  Pain : No/denies pain     BMI - recorded: 23.89 Nutritional Status: BMI of 19-24  Normal Nutritional Risks: None Diabetes: No  How often do you need to have someone help you when you read instructions, pamphlets, or other written materials from your doctor or pharmacy?: 1 - Never  Diabetic?no  Interpreter Needed?: No  Information entered by :: Fredirick Maudlin   Activities of Daily Living    02/13/2023    8:26 AM 06/05/2022    3:25 PM  In your present state of health, do you have any difficulty performing the following activities:  Hearing? 0 0  Vision? 0 0  Difficulty concentrating or making decisions? 0 0  Walking or climbing stairs?  0 0  Dressing or bathing? 0 0  Doing errands, shopping? 0 0  Preparing Food and eating ? N N  Using the Toilet? N N  In the past six months, have you accidently leaked urine? N N  Do you have problems with loss of bowel control? N N  Managing your Medications? N N  Managing your Finances? N N  Housekeeping or managing your Housekeeping? N N    Patient Care Team: Sonny Masters, FNP as PCP - General (Family Medicine) Nahser, Deloris Ping, MD as PCP - Cardiology (Cardiology) Clinton Gallant, RN as [facility] Care Management  Indicate any recent Medical Services you may have received from other than [facility] providers in the past year (date may be approximate).     Assessment:   This is a routine wellness examination for Val.  Hearing/Vision screen Hearing Screening - Comments:: Denies hearing difficulties   Vision Screening - Comments:: Wears rx glasses - up to date with routine eye exams with  [facility]   Dietary issues and exercise activities discussed: Current Exercise Habits: Home exercise routine, Type of exercise: Other - see comments (farming), Time (Minutes): 60, Frequency (Times/Week): 6, Weekly Exercise (Minutes/Week): 360, Intensity: Moderate, Exercise limited by: orthopedic condition(s)   Goals Addressed             This Visit's Progress    Patient Stated       Patient stay healthy       Depression Screen    02/13/2023    8:20 AM 10/22/2022   10:25 AM 07/09/2022    9:10 AM 06/24/2022    1:31 PM 06/05/2022    3:20 PM 06/03/2022    2:23 PM 05/23/2022    8:53 AM  PHQ 2/9 Scores  PHQ - 2 Score 0 1 2 1 1 2  0  PHQ- 9 Score   6 4  5  0    Fall Risk    02/13/2023    8:25 AM 10/22/2022   10:21 AM 07/09/2022    9:10 AM 06/05/2022    3:24 PM 04/14/2022  10:19 AM  Fall Risk   Falls in the past year? 0 0 0 0 0  Number falls in past yr: 0 0  0   Injury with Fall? 0 0  0   Risk for fall due to : No Fall Risks No Fall Risks  No Fall Risks   Follow up  Falls prevention discussed;Falls evaluation completed Falls evaluation completed  Falls evaluation completed;Education provided;Falls prevention discussed     FALL RISK PREVENTION PERTAINING TO THE HOME:  Any stairs in or around the home? Yes  If so, are there any without handrails? No  Home free of loose throw rugs in walkways, pet beds, electrical cords, etc? No  Adequate lighting in your home to reduce risk of falls? Yes   ASSISTIVE DEVICES UTILIZED TO PREVENT FALLS:  Life alert? No  Use of a cane, walker or w/c? No  Grab bars in the bathroom? No  Shower chair or bench in shower? No  Elevated toilet seat or a handicapped toilet? No   TIMED UP AND GO:  Was the test performed?  NO televisit  .   Cognitive Function:        02/13/2023    8:21 AM 02/11/2022    8:28 AM 02/07/2021    8:53 AM  6CIT Screen  What Year? 0 points 0 points 0 points  What month? 0 points 0 points 0 points  What time? 0 points 0 points 0 points  Count back from 20 0 points 0 points 0 points  Months in reverse 0 points 0 points 0 points  Repeat phrase 0 points 0 points 0 points  Total Score 0 points 0 points 0 points    Immunizations Immunization History  Administered Date(s) Administered   Pneumococcal Polysaccharide-23 03/29/2013    TDAP status: Due, Education has been provided regarding the importance of this vaccine. Advised may receive this vaccine at local pharmacy or Health Dept. Aware to provide a copy of the vaccination record if obtained from local pharmacy or Health Dept. Verbalized acceptance and understanding.  Flu Vaccine status: Declined, Education has been provided regarding the importance of this vaccine but patient still declined. Advised may receive this vaccine at local pharmacy or Health Dept. Aware to provide a copy of the vaccination record if obtained from local pharmacy or Health Dept. Verbalized acceptance and understanding.  Pneumococcal vaccine status: Declined,   Education has been provided regarding the importance of this vaccine but patient still declined. Advised may receive this vaccine at local pharmacy or Health Dept. Aware to provide a copy of the vaccination record if obtained from local pharmacy or Health Dept. Verbalized acceptance and understanding.   Covid-19 vaccine status: Declined, Education has been provided regarding the importance of this vaccine but patient still declined. Advised may receive this vaccine at local pharmacy or Health Dept.or vaccine clinic. Aware to provide a copy of the vaccination record if obtained from local pharmacy or Health Dept. Verbalized acceptance and understanding.  Qualifies for Shingles Vaccine? Yes   Zostavax completed No   Shingrix Completed?: No.    Education has been provided regarding the importance of this vaccine. Patient has been advised to call insurance company to determine out of pocket expense if they have not yet received this vaccine. Advised may also receive vaccine at local pharmacy or Health Dept. Verbalized acceptance and understanding.  Screening Tests Health Maintenance  Topic Date Due   DTaP/Tdap/Td (1 - Tdap) Never done   Zoster Vaccines- Shingrix (1 of  2) Never done   Pneumonia Vaccine 54+ Years old (2 of 2 - PCV) 02/13/2024 (Originally 03/29/2014)   COVID-19 Vaccine (1) 02/29/2024 (Originally 01/09/1951)   INFLUENZA VACCINE  04/16/2023   Hepatitis C Screening  Completed   HPV VACCINES  Aged Out    Health Maintenance  Health Maintenance Due  Topic Date Due   DTaP/Tdap/Td (1 - Tdap) Never done   Zoster Vaccines- Shingrix (1 of 2) Never done    Colorectal cancer screening: Referral to GI placed at next appointment . Pt aware the office will call re: appt.  Lung Cancer Screening: (Low Dose CT Chest recommended if Age 75-80 years, 30 pack-year currently smoking OR have quit w/in 15years.) does not qualify.   Lung Cancer Screening Referral: NO  Additional  Screening:  Hepatitis C Screening: does qualify; Completed 01/08/22  Vision Screening: Recommended annual ophthalmology exams for early detection of glaucoma and other disorders of the eye. Is the patient up to date with their annual eye exam?  Yes  Who is the provider or what is the name of the office in which the patient attends annual eye exams? [facility]  If pt is not established with a provider, would they like to be referred to a provider to establish care? No .   Dental Screening: Recommended annual dental exams for proper oral hygiene  Community Resource Referral / Chronic Care Management: CRR required this visit?  No   CCM required this visit?  No      Plan:     I have personally reviewed and noted the following in the patient's chart:   Medical and social history Use of alcohol, tobacco or illicit drugs  Current medications and supplements including opioid prescriptions. Patient is not currently taking opioid prescriptions. Functional ability and status Nutritional status Physical activity Advanced directives List of other physicians Hospitalizations, surgeries, and ER visits in previous 12 months Vitals Screenings to include cognitive, depression, and falls Referrals and appointments  In addition, I have reviewed and discussed with patient certain preventive protocols, quality metrics, and best practice recommendations. A written personalized care plan for preventive services as well as general preventive health recommendations were provided to patient.     Annabell Sabal, CMA   02/13/2023   Nurse Notes: none

## 2023-02-13 NOTE — Patient Instructions (Signed)
[name] [name] , Thank you for taking time to come for your Medicare Wellness Visit. I appreciate your ongoing commitment to your health goals. Please review the following plan we discussed and let me know if I can assist you in the future.   These are the goals we discussed:  Goals       Patient Stated     manage dental care needs [facility]) (pt-stated)      Care Coordination Interventions: Confirmed patient had 9 extractions about a month ago and is healing well. Plus an additional 5 extractions completed Confirmed he had his teeth pulled (including canines) Healing good but remains sore with assessment With assessment, confirmed trouble with lower dentures now and trouble with eating as not fitting right- rubs on the inside of the jaw when he eats. Pain level 3  Confirmed a recent weight gaining & improvement related to depression symptoms Discussed transportation option via his blue cross coverage when he discussed difficulty with driving to [place] 45 miles one way to the present dental provider. Assessed if transportation assistance needed  Confirmed he is unwilling to have further oral surgery as offered He reports not wanting more oral surgery Encouraged him to outreach to RN CM if any further assistance is needed       Manage hyperlipidema (HDL) ([facility]) (pt-stated)      Care Coordination Interventions: Provider established cholesterol goals reviewed Reviewed importance of limiting foods high in cholesterol Confirms he continues to take in foods to assist with increasing his LDL like sardines, avocado, berries, salmon, beets, kale , spinach, red bell peppers, olive oil, oatmeal, Chicken breasts, tomatoes, halibut, enriched breads, cereals, romaine lettuce, nuts Reviewed his last lipid results in 2023 HDL was 39 (needs to be > 39)  Education sent via my chart on HDL       Other     Patient Stated      Patient stay healthy         This is a list of the screening recommended for you  and due dates:  Health Maintenance  Topic Date Due   DTaP/Tdap/Td vaccine (1 - Tdap) Never done   Zoster (Shingles) Vaccine (1 of 2) Never done   Pneumonia Vaccine (2 of 2 - PCV) 02/13/2024*   COVID-19 Vaccine (1) 02/29/2024*   Flu Shot  04/16/2023   Hepatitis C Screening  Completed   HPV Vaccine  Aged Out  *Topic was postponed. The date shown is not the original due date.    Advanced directives: Advance directive discussed with you today. I have provided a copy for you to complete at home and have notarized. Once this is complete please bring a copy in to our office so we can scan it into your chart.   Conditions/risks identified: Aim for 30 minutes of exercise or brisk walking, 6-8 glasses of water, and 5 servings of fruits and vegetables each day.   Next appointment: Follow up in one year for your annual wellness visit. 02/13/24  Preventive Care 77 Years and Older, Male  Preventive care refers to lifestyle choices and visits with your health care provider that can promote health and wellness. What does preventive care include? A yearly physical exam. This is also called an annual well check. Dental exams once or twice a year. Routine eye exams. Ask your health care provider how often you should have your eyes checked. Personal lifestyle choices, including: Daily care of your teeth and gums. Regular physical activity. Eating a healthy diet. Avoiding  tobacco and drug use. Limiting alcohol use. Practicing safe sex. Taking low doses of aspirin every day. Taking vitamin and mineral supplements as recommended by your health care provider. What happens during an annual well check? The services and screenings done by your health care provider during your annual well check will depend on your age, overall health, lifestyle risk factors, and family history of disease. Counseling  Your health care provider may ask you questions about your: Alcohol use. Tobacco use. Drug  use. Emotional well-being. Home and relationship well-being. Sexual activity. Eating habits. History of falls. Memory and ability to understand (cognition). Work and work Astronomer. Screening  You may have the following tests or measurements: Height, weight, and BMI. Blood pressure. Lipid and cholesterol levels. These may be checked every 5 years, or more frequently if you are over 70 years old. Skin check. Lung cancer screening. You may have this screening every year starting at age 77 if you have a 30-pack-year history of smoking and currently smoke or have quit within the past 15 years. Fecal occult blood test (FOBT) of the stool. You may have this test every year starting at age 77. Flexible sigmoidoscopy or colonoscopy. You may have a sigmoidoscopy every 5 years or a colonoscopy every 10 years starting at age 77. Prostate cancer screening. Recommendations will vary depending on your family history and other risks. Hepatitis C blood test. Hepatitis B blood test. Sexually transmitted disease (STD) testing. Diabetes screening. This is done by checking your blood sugar (glucose) after you have not eaten for a while (fasting). You may have this done every 1-3 years. Abdominal aortic aneurysm (AAA) screening. You may need this if you are a current or former smoker. Osteoporosis. You may be screened starting at age 77 if you are at high risk. Talk with your health care provider about your test results, treatment options, and if necessary, the need for more tests. Vaccines  Your health care provider may recommend certain vaccines, such as: Influenza vaccine. This is recommended every year. Tetanus, diphtheria, and acellular pertussis (Tdap, Td) vaccine. You may need a Td booster every 10 years. Zoster vaccine. You may need this after age 26. Pneumococcal 13-valent conjugate (PCV13) vaccine. One dose is recommended after age 77. Pneumococcal polysaccharide (PPSV23) vaccine. One dose is  recommended after age 77. Talk to your health care provider about which screenings and vaccines you need and how often you need them. This information is not intended to replace advice given to you by your health care provider. Make sure you discuss any questions you have with your health care provider. Document Released: 09/28/2015 Document Revised: 05/21/2016 Document Reviewed: 07/03/2015 Elsevier Interactive Patient Education  2017 [employer].  Fall Prevention in the Home Falls can cause injuries. They can happen to people of all ages. There are many things you can do to make your home safe and to help prevent falls. What can I do on the outside of my home? Regularly fix the edges of walkways and driveways and fix any cracks. Remove anything that might make you trip as you walk through a door, such as a raised step or threshold. Trim any bushes or trees on the path to your home. Use bright outdoor lighting. Clear any walking paths of anything that might make someone trip, such as rocks or tools. Regularly check to see if handrails are loose or broken. Make sure that both sides of any steps have handrails. Any raised decks and porches should have guardrails on the  edges. Have any leaves, snow, or ice cleared regularly. Use sand or salt on walking paths during winter. Clean up any spills in your garage right away. This includes oil or grease spills. What can I do in the bathroom? Use night lights. Install grab bars by the toilet and in the tub and shower. Do not use towel bars as grab bars. Use non-skid mats or decals in the tub or shower. If you need to sit down in the shower, use a plastic, non-slip stool. Keep the floor dry. Clean up any water that spills on the floor as soon as it happens. Remove soap buildup in the tub or shower regularly. Attach bath mats securely with double-sided non-slip rug tape. Do not have throw rugs and other things on the floor that can make you  trip. What can I do in the bedroom? Use night lights. Make sure that you have a light by your bed that is easy to reach. Do not use any sheets or blankets that are too big for your bed. They should not hang down onto the floor. Have a firm chair that has side arms. You can use this for support while you get dressed. Do not have throw rugs and other things on the floor that can make you trip. What can I do in the kitchen? Clean up any spills right away. Avoid walking on wet floors. Keep items that you use a lot in easy-to-reach places. If you need to reach something above you, use a strong step stool that has a grab bar. Keep electrical cords out of the way. Do not use floor polish or wax that makes floors slippery. If you must use wax, use non-skid floor wax. Do not have throw rugs and other things on the floor that can make you trip. What can I do with my stairs? Do not leave any items on the stairs. Make sure that there are handrails on both sides of the stairs and use them. Fix handrails that are broken or loose. Make sure that handrails are as long as the stairways. Check any carpeting to make sure that it is firmly attached to the stairs. Fix any carpet that is loose or worn. Avoid having throw rugs at the top or bottom of the stairs. If you do have throw rugs, attach them to the floor with carpet tape. Make sure that you have a light switch at the top of the stairs and the bottom of the stairs. If you do not have them, ask someone to add them for you. What else can I do to help prevent falls? Wear shoes that: Do not have high heels. Have rubber bottoms. Are comfortable and fit you well. Are closed at the toe. Do not wear sandals. If you use a stepladder: Make sure that it is fully opened. Do not climb a closed stepladder. Make sure that both sides of the stepladder are locked into place. Ask someone to hold it for you, if possible. Clearly mark and make sure that you can  see: Any grab bars or handrails. First and last steps. Where the edge of each step is. Use tools that help you move around (mobility aids) if they are needed. These include: Canes. Walkers. Scooters. Crutches. Turn on the lights when you go into a dark area. Replace any light bulbs as soon as they burn out. Set up your furniture so you have a clear path. Avoid moving your furniture around. If any of your floors are uneven,  fix them. If there are any pets around you, be aware of where they are. Review your medicines with your doctor. Some medicines can make you feel dizzy. This can increase your chance of falling. Ask your doctor what other things that you can do to help prevent falls. This information is not intended to replace advice given to you by your health care provider. Make sure you discuss any questions you have with your health care provider. Document Released: 06/28/2009 Document Revised: 02/07/2016 Document Reviewed: 10/06/2014 Elsevier Interactive Patient Education  2017 [employer].

## 2023-02-16 ENCOUNTER — Ambulatory Visit: Payer: Self-pay | Admitting: *Deleted

## 2023-02-16 NOTE — Addendum Note (Signed)
Addended by: Clinton Gallant on: 02/16/2023 12:08 PM   Modules accepted: Orders

## 2023-02-16 NOTE — Patient Outreach (Signed)
  Care Coordination   Follow Up Visit Note   12/10/2023 updated note for 02/16/23 Name: [name] [name]. MRN: [id number] DOB: [birth year]  [name] Blood. is a 77 y.o. year old male who sees Rakes, Doralee Albino, FNP for primary care. I spoke with  [name] Blood. by phone today.  What matters to the patients health and wellness today?  He reports he is doing better related to his dental concerns.   Maintaining his weight  Cardiac-at intervals recently he has noticed dizziness if he stands quickly or looks up x 2 (working on a tractor, raking hay 02/11/23 & 02/12/23 Not severe per patient for about 10 seconds Denies headache, chest pain, fluttering in chest/palpitations, visual changes   Discussed reasons for hypotension too much medicines, dehydration, dizziness, constipation Encouraged him to monitor BP if possible Encourage him to follow up with his MD & outreach to RN CM as needed as needs met at this time     Goals Addressed               This Visit's Progress     Patient Stated     COMPLETED: manage dental care needs [facility]) (pt-stated)   On track     Care Coordination Interventions: Confirmed patient had 9 extractions about a month ago and is healing well. Plus an additional 5 extractions completed Confirmed he had his teeth pulled (including canines) Healing good but remains sore with assessment With assessment, confirmed trouble with lower dentures now and trouble with eating as not fitting right- rubs on the inside of the jaw when he eats. Pain level 3  Confirmed a recent weight gaining & improvement related to depression symptoms Discussed transportation option via his blue cross coverage when he discussed difficulty with driving to  45 miles one way to the present dental provider. Assessed if transportation assistance needed  Confirmed he is unwilling to have further oral surgery as offered He reports not wanting more oral surgery Encouraged him to outreach to  RN CM if any further assistance is needed       COMPLETED: Manage hyperlipidema (HDL) ([facility]) (pt-stated)   On track     Care Coordination Interventions: Provider established cholesterol goals reviewed Reviewed importance of limiting foods high in cholesterol Confirms he continues to take in foods to assist with increasing his LDL like sardines, avocado, berries, salmon, beets, kale , spinach, red bell peppers, olive oil, oatmeal, Chicken breasts, tomatoes, halibut, enriched breads, cereals, romaine lettuce, nuts Reviewed his last lipid results in 2023 HDL was 39 (needs to be > 39)  Education on follow up for further HDL+ labs         SDOH assessments and interventions completed:  No     Care Coordination Interventions:  Yes, provided   Follow up plan: No further intervention required.   Encounter Outcome:  Pt. Visit Completed \  Jehad Bisono L. Noelle Penner, RN, BSN, CCM [facility] RN Care Manager [phone]

## 2023-02-16 NOTE — Patient Instructions (Addendum)
 Visit Information  Thank you for taking time to visit with me today. Please don't hesitate to contact me if I can be of assistance to you.   Following are the goals we discussed today:   Goals Addressed               This Visit's Progress     Patient Stated     COMPLETED: manage dental care needs [facility]) (pt-stated)   On track     Care Coordination Interventions: Confirmed patient had 9 extractions about a month ago and is healing well. Plus an additional 5 extractions completed Confirmed he had his teeth pulled (including canines) Healing good but remains sore with assessment With assessment, confirmed trouble with lower dentures now and trouble with eating as not fitting right- rubs on the inside of the jaw when he eats. Pain level 3  Confirmed a recent weight gaining & improvement related to depression symptoms Discussed transportation option via his blue cross coverage when he discussed difficulty with driving to [place] 45 miles one way to the present dental provider. Assessed if transportation assistance needed  Confirmed he is unwilling to have further oral surgery as offered He reports not wanting more oral surgery Encouraged him to outreach to RN CM if any further assistance is needed       COMPLETED: Manage hyperlipidema (HDL) ([facility]N) (pt-stated)   On track     Care Coordination Interventions: Provider established cholesterol goals reviewed Reviewed importance of limiting foods high in cholesterol Confirms he continues to take in foods to assist with increasing his LDL like sardines, avocado, berries, salmon, beets, kale , spinach, red bell peppers, olive oil, oatmeal, Chicken breasts, tomatoes, halibut, enriched breads, cereals, romaine lettuce, nuts Reviewed his last lipid results in 2023 HDL was 39 (needs to be > 39)  Education on follow up for further HDL+ labs         Our next appointment is by telephone on as needed at as needed  Please call the care guide team  at [phone] if you need to cancel or reschedule your appointment.   If you are experiencing a Mental Health or Behavioral Health Crisis or need someone to talk to, please call the Suicide and Crisis Lifeline: 988 call the [place] National Suicide Prevention Lifeline: 629-289-4383 or TTY: 701-038-0097 TTY 906-595-7245) to talk to a trained counselor call 1-800-273-TALK (toll free, 24 hour hotline) call the [facility]: 680-224-4278 call 911   Patient verbalizes understanding of instructions and care plan provided today and agrees to view in MyChart. Active MyChart status and patient understanding of how to access instructions and care plan via MyChart confirmed with patient.     The patient has been provided with contact information for the care management team and has been advised to call with any health related questions or concerns.   Pawan Knechtel L. Noelle Penner, RN, BSN, CCM [facility] Care Management Community Coordinator Office number [phone]

## 2023-02-16 NOTE — Patient Outreach (Signed)
  Care Coordination   02/16/2023 Name: [name] [name]. MRN: [id number] DOB: 10/22/1945   Care Coordination Outreach Attempts:  An unsuccessful telephone outreach was attempted today to offer the patient information about available care coordination services.  Follow Up Plan:  Additional outreach attempts will be made to offer the patient care coordination information and services.   Encounter Outcome:  No Answer   Care Coordination Interventions:  No, not indicated    Secilia Apps L. Noelle Penner, RN, BSN, CCM [facility] Care Management Community Coordinator Office number [phone]

## 2023-03-09 DIAGNOSIS — I10 Essential (primary) hypertension: Secondary | ICD-10-CM | POA: Diagnosis not present

## 2023-05-13 ENCOUNTER — Other Ambulatory Visit: Payer: Self-pay | Admitting: Cardiovascular Disease

## 2023-09-17 ENCOUNTER — Encounter: Payer: Self-pay | Admitting: Family Medicine

## 2023-09-17 ENCOUNTER — Ambulatory Visit: Payer: Medicare Other | Admitting: Family Medicine

## 2023-09-17 VITALS — BP 124/77 | HR 79 | Ht 66.0 in | Wt 142.0 lb

## 2023-09-17 DIAGNOSIS — L245 Irritant contact dermatitis due to other chemical products: Secondary | ICD-10-CM | POA: Diagnosis not present

## 2023-09-17 MED ORDER — TRIAMCINOLONE ACETONIDE 0.1 % EX CREA: 1.0000 | TOPICAL_CREAM | Freq: Two times a day (BID) | CUTANEOUS | 0 refills | Status: DC

## 2023-09-17 MED ORDER — PREDNISONE 20 MG PO TABS: ORAL_TABLET | ORAL | 0 refills | Status: DC

## 2023-09-17 NOTE — Progress Notes (Signed)
 BP 124/77   Pulse 79   Ht 5' 6 (1.676 m)   Wt 142 lb (64.4 kg)   SpO2 99%   BMI 22.92 kg/m    Subjective:   Patient ID: [name] JINNY Tish Mickey., male    DOB: Nov 07, 1945, 77 y.o.   MRN: [id number]  HPI: [name] Mclees. is a 78 y.o. male presenting on 09/17/2023 for Hand Pain and Foot Pain (Sites are peeling, cracking, swelling)   HPI Skin cracking and peeling Patient is having a lot of skin cracking and feeling of dry skin on his hands and both his feet that started a couple months ago and is just been getting worse.  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  He did try and wear gloves and was wearing shoes but does not know if it came from that.  He was also cleaning some other stuff up there.  He says it has continued to worsen.  He does have an appointment with dermatology next week.  He denies any purulence or drainage.  He does not know what the chemical is that he came in contact with.  Relevant past medical, surgical, family and social history reviewed and updated as indicated. Interim medical history since our last visit reviewed. Allergies and medications reviewed and updated.  Review of Systems  Constitutional:  Negative for chills and fever.  Eyes:  Negative for visual disturbance.  Respiratory:  Negative for shortness of breath and wheezing.   Cardiovascular:  Negative for chest pain and leg swelling.  Skin:  Positive for color change, rash and wound.  All other systems reviewed and are negative.   Per HPI unless specifically indicated above   Allergies as of 09/17/2023       Reactions   Carvedilol     diarrhea   Lipitor [atorvastatin] Other (See Comments)   Makes pt feel funky   Plavix  [clopidogrel ] Rash        Medication List        Accurate as of September 17, 2023  1:41 PM. If you have any questions, ask your nurse or doctor.          aspirin  81 MG chewable  tablet Chew 81 mg by mouth daily.   cholecalciferol  25 MCG (1000 UNIT) tablet Commonly known as: VITAMIN D3 Take 1,000 Units by mouth daily.   HYDROcodone -acetaminophen  7.5-325 MG tablet Commonly known as: NORCO 1 tablet every 4 (four) hours as needed (pain) for up to 30 doses.   losartan  50 MG tablet Commonly known as: COZAAR  TAKE ONE TABLET ONCE DAILY   metoprolol  succinate 25 MG 24 hr tablet Commonly known as: Toprol  XL Take 0.5 tablets (12.5 mg total) by mouth daily.   nitroGLYCERIN  0.4 MG SL tablet Commonly known as: Nitrostat  Place 1 tablet (0.4 mg total) under the tongue every 5 (five) minutes as needed for chest pain.   predniSONE  20 MG tablet Commonly known as: DELTASONE  2 po at same time daily for 5 days Started by: Fonda LABOR [name] [name]   rosuvastatin  20 MG tablet Commonly known as: CRESTOR  Take 1 tablet (20 mg total) by mouth daily. Please call [phone] to schedule an appointment for future refills. Thank you.   tadalafil  10 MG tablet Commonly known as: CIALIS  TAKE 1 TO 2 TABLETS AS DIRECTED AS NEEDED   triamcinolone  cream 0.1 % Commonly known as: KENALOG  Apply 1 Application topically  2 (two) times daily.         Objective:   BP 124/77   Pulse 79   Ht 5' 6 (1.676 m)   Wt 142 lb (64.4 kg)   SpO2 99%   BMI 22.92 kg/m   Wt Readings from Last 3 Encounters:  09/17/23 142 lb (64.4 kg)  02/13/23 148 lb (67.1 kg)  07/16/22 148 lb 6.4 oz (67.3 kg)    Physical Exam Vitals and nursing note reviewed.  Constitutional:      General: He is not in acute distress.    Appearance: He is well-developed. He is not diaphoretic.  Eyes:     General: No scleral icterus.    Conjunctiva/sclera: Conjunctivae normal.  Neck:     Thyroid : No thyromegaly.  Skin:    General: Skin is warm and dry.     Findings: Rash (Cracking and peeling and irritation on both hands and feet, no signs of infection or purulence) present.  Neurological:     Mental Status: He is  alert and oriented to person, place, and time.     Coordination: Coordination normal.  Psychiatric:        Behavior: Behavior normal.       Assessment & Plan:   Problem List Items Addressed This Visit   None Visit Diagnoses       Irritant contact dermatitis due to other chemical products    -  Primary   Relevant Medications   triamcinolone  cream (KENALOG ) 0.1 %   predniSONE  (DELTASONE ) 20 MG tablet       Likely contact irritant dermatitis from a chemical, he does have dermatology next week but will try with oral steroids and steroid creams. Follow up plan: Return if symptoms worsen or fail to improve.  Counseling provided for all of the vaccine components No orders of the defined types were placed in this encounter.   Fonda Levins, MD [facility] 09/17/2023, 1:41 PM

## 2023-09-24 DIAGNOSIS — B353 Tinea pedis: Secondary | ICD-10-CM | POA: Diagnosis not present

## 2023-09-24 DIAGNOSIS — L308 Other specified dermatitis: Secondary | ICD-10-CM | POA: Diagnosis not present

## 2023-10-08 DIAGNOSIS — D225 Melanocytic nevi of trunk: Secondary | ICD-10-CM | POA: Diagnosis not present

## 2023-10-08 DIAGNOSIS — X32XXXD Exposure to sunlight, subsequent encounter: Secondary | ICD-10-CM | POA: Diagnosis not present

## 2023-10-08 DIAGNOSIS — D485 Neoplasm of uncertain behavior of skin: Secondary | ICD-10-CM | POA: Diagnosis not present

## 2023-10-08 DIAGNOSIS — L570 Actinic keratosis: Secondary | ICD-10-CM | POA: Diagnosis not present

## 2023-12-07 ENCOUNTER — Other Ambulatory Visit: Payer: Self-pay | Admitting: Cardiovascular Disease

## 2023-12-07 ENCOUNTER — Other Ambulatory Visit: Payer: Self-pay | Admitting: Physician Assistant

## 2023-12-24 ENCOUNTER — Other Ambulatory Visit: Payer: Self-pay | Admitting: Family Medicine

## 2023-12-24 ENCOUNTER — Other Ambulatory Visit: Payer: Self-pay | Admitting: Cardiovascular Disease

## 2023-12-24 NOTE — Telephone Encounter (Signed)
 Copied from CRM [id number]. Topic: Clinical - Medication Refill >> Dec 24, 2023  4:26 PM Pierre Bali B wrote: Most Recent Primary Care Visit:   Medication: losartan (COZAAR) 50 MG tablet , rosuvastatin (CRESTOR) 20 MG tablet  Has the patient contacted their pharmacy? Yes (Agent: If no, request that the patient contact the pharmacy for the refill. If patient does not wish to contact the pharmacy document the reason why and proceed with request.) (Agent: If yes, when and what did the pharmacy advise?)  Is this the correct pharmacy for this prescription? Yes If no, delete pharmacy and type the correct one.  This is the patient's preferred pharmacy:  [facility] [place], [place] - [address] [place] [address] Phone: [phone] Fax: [phone]   Has the prescription been filled recently? No  Is the patient out of the medication? No  Has the patient been seen for an appointment in the last year OR does the patient have an upcoming appointment? Yes  Can we respond through MyChart? No  Agent: Please be advised that Rx refills may take up to 3 business days. We ask that you follow-up with your pharmacy.

## 2024-01-08 ENCOUNTER — Encounter: Payer: Self-pay | Admitting: Family Medicine

## 2024-01-08 ENCOUNTER — Ambulatory Visit: Payer: Medicare Other | Admitting: Family Medicine

## 2024-01-08 VITALS — BP 134/69 | HR 86 | Temp 97.4°F | Ht 66.0 in | Wt 138.8 lb

## 2024-01-08 DIAGNOSIS — E559 Vitamin D deficiency, unspecified: Secondary | ICD-10-CM | POA: Diagnosis not present

## 2024-01-08 DIAGNOSIS — I5020 Unspecified systolic (congestive) heart failure: Secondary | ICD-10-CM | POA: Diagnosis not present

## 2024-01-08 DIAGNOSIS — I2510 Atherosclerotic heart disease of native coronary artery without angina pectoris: Secondary | ICD-10-CM | POA: Diagnosis not present

## 2024-01-08 DIAGNOSIS — I10 Essential (primary) hypertension: Secondary | ICD-10-CM | POA: Diagnosis not present

## 2024-01-08 DIAGNOSIS — R062 Wheezing: Secondary | ICD-10-CM

## 2024-01-08 DIAGNOSIS — J301 Allergic rhinitis due to pollen: Secondary | ICD-10-CM

## 2024-01-08 DIAGNOSIS — I110 Hypertensive heart disease with heart failure: Secondary | ICD-10-CM

## 2024-01-08 DIAGNOSIS — I252 Old myocardial infarction: Secondary | ICD-10-CM

## 2024-01-08 DIAGNOSIS — E782 Mixed hyperlipidemia: Secondary | ICD-10-CM | POA: Diagnosis not present

## 2024-01-08 DIAGNOSIS — M5432 Sciatica, left side: Secondary | ICD-10-CM

## 2024-01-08 MED ORDER — PREDNISONE 20 MG PO TABS
40.0000 mg | ORAL_TABLET | Freq: Every day | ORAL | 0 refills | Status: AC
Start: 2024-01-08 — End: 2024-01-13

## 2024-01-08 MED ORDER — FLUTICASONE PROPIONATE 50 MCG/ACT NA SUSP: 2.0000 | Freq: Every day | NASAL | 6 refills | Status: DC

## 2024-01-08 MED ORDER — ROSUVASTATIN CALCIUM 20 MG PO TABS: 20.0000 mg | ORAL_TABLET | Freq: Every day | ORAL | 3 refills | Status: DC

## 2024-01-08 MED ORDER — LOSARTAN POTASSIUM 50 MG PO TABS: 50.0000 mg | ORAL_TABLET | Freq: Every day | ORAL | 3 refills | Status: DC

## 2024-01-08 MED ORDER — ALBUTEROL SULFATE HFA 108 (90 BASE) MCG/ACT IN AERS: 2.0000 | INHALATION_SPRAY | Freq: Four times a day (QID) | RESPIRATORY_TRACT | 2 refills | Status: DC | PRN

## 2024-01-08 NOTE — Progress Notes (Signed)
 Subjective:  Patient ID: [name] Caro., male    DOB: 1946/06/20, 78 y.o.   MRN: [id number]  Patient Care Team: Galvin Jules, FNP as PCP - General (Family Medicine) Nahser, Lela Purple, MD as PCP - Cardiology (Cardiology)   Chief Complaint:  Medical Management of Chronic Issues (6 month follow up ) and trouble sleeping (X 2 nights )   HPI: [name] Mosqueda. is a 78 y.o. male presenting on 01/08/2024 for Medical Management of Chronic Issues (6 month follow up ) and trouble sleeping (X 2 nights )   History of Present Illness   [name] Tant. is a 78 year old male with heart failure who presents with worsening allergy symptoms and respiratory issues.  He has been experiencing significant allergy symptoms due to high pollen levels, including coughing, wheezing, and nasal drainage, which have disrupted his sleep over the past two nights. He has not been using any antihistamines or other allergy medications recently, as he previously found them unpleasant. He has been using local honey to manage his allergies.  He has a history of heart failure and a past heart attack. He is currently taking rivastatin and losartan , although he recently ran out of these medications for four to five days, which he believes may have contributed to a temporary increase in his blood pressure. He has since resumed these medications and takes them regularly, along with daily vitamin D  and aspirin .  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 This pain occurs when he is active, such as raking the yard, and is described as a slight burning sensation. No loss of bowel or bladder function is noted.  No chest pain, shortness of breath, or leg swelling. However, he notes feeling more fatigued recently, which he attributes to reduced physical activity due to pollen exposure. He has not been using any medications like Mucinex or Flonase  for his  symptoms.  He recalls using an albuterol  inhaler in the past after a hospital stay but has not used one recently. He is hesitant to take medications but is open to using an inhaler if necessary.          Relevant past medical, surgical, family, and social history reviewed and updated as indicated.  Allergies and medications reviewed and updated. Data reviewed: Chart in Epic.   Past Medical History:  Diagnosis Date   Coronary artery disease    Erectile dysfunction    HFrEF (heart failure with reduced ejection fraction) (HCC)    Echocardiogram 07/14/21:  EF 25, global HK, inf HK worse, Gr 2 DD, normal RVSF, mild to mod MR, AV sclerosis w/o AS Echocardiogram 2/23: EF 25-30, global HK, GLS -12.9, normal RVSF, mild to mod MR, AV sclerosis w/o AS    History of hiatal hernia    a lone time ago   Hyperlipidemia    [place] fracture [facility]) 03/22/2020   Myocardial infarction (HCC)    x 2   Polio     Past Surgical History:  Procedure Laterality Date   APPENDECTOMY     CARDIAC CATHETERIZATION     has two stents placed   CERVICAL SPINE SURGERY     c3-4, 4-5,  5-6, 8 screws and plates   CORONARY STENT INTERVENTION N/A 07/15/2021   Procedure: CORONARY STENT INTERVENTION;  Surgeon: Avanell Leigh, MD;  Location: [facility];  Service: Cardiovascular;  Laterality: N/A;  CORONARY STENT PLACEMENT     forein body removal     metal sharp removed from right leg   LEFT HEART CATH AND CORONARY ANGIOGRAPHY N/A 11/15/2018   Procedure: LEFT HEART CATH AND CORONARY ANGIOGRAPHY;  Surgeon: Sammy Crisp, MD;  Location: [facility];  Service: Cardiovascular;  Laterality: N/A;   LEFT HEART CATH AND CORONARY ANGIOGRAPHY N/A 07/15/2021   Procedure: LEFT HEART CATH AND CORONARY ANGIOGRAPHY;  Surgeon: Avanell Leigh, MD;  Location: [facility];  Service: Cardiovascular;  Laterality: N/A;   MANDIBULAR HARDWARE REMOVAL Bilateral 04/23/2020   Procedure: MANDIBULAR HARDWARE REMOVAL;   Surgeon: Janita Mellow, MD;  Location: [facility];  Service: ENT;  Laterality: Bilateral;   ORIF MANDIBULAR FRACTURE N/A 03/14/2020   Procedure: OPEN REDUCTION INTERNAL FIXATION (ORIF) MID  FACE FRACTURE, MANDIBULAR FIXATION MODIFIED ARCH BARS;  Surgeon: Janita Mellow, MD;  Location: [facility];  Service: ENT;  Laterality: N/A;   SCAR REVISION N/A 04/23/2020   Procedure: SCAR REVISION/ Upper Lip Repair;  Surgeon: Janita Mellow, MD;  Location: [facility];  Service: ENT;  Laterality: N/A;    Social History   Socioeconomic History   Marital status: Divorced    Spouse name: Not on file   Number of children: 2   Years of education: 12   Highest education level: 12th grade  Occupational History   Occupation: retired    Comment: farmer  Tobacco Use   Smoking status: Former    Types: Cigarettes    Passive exposure: Past   Smokeless tobacco: Never  Vaping Use   Vaping status: Never Used  Substance and Sexual Activity   Alcohol use: Yes    Alcohol/week: 21.0 standard drinks of alcohol    Types: 21 Cans of beer per week    Comment: 2 beers daily   Drug use: No   Sexual activity: Not on file  Other Topics Concern   Not on file  Social History Narrative   Mr. Arrighi has a restraining order against one of his sons.   Social Drivers of Corporate investment banker Strain: Low Risk  (02/13/2023)   Overall Financial Resource Strain (CARDIA)    Difficulty of Paying Living Expenses: Not hard at all  Food Insecurity: No Food Insecurity (02/13/2023)   Hunger Vital Sign    Worried About Running Out of Food in the Last Year: Never true    Ran Out of Food in the Last Year: Never true  Transportation Needs: No Transportation Needs (02/13/2023)   PRAPARE - Administrator, Civil Service (Medical): No    Lack of Transportation (Non-Medical): No  Physical Activity: Insufficiently Active (02/13/2023)   Exercise Vital Sign    Days of Exercise per Week: 3 days    Minutes  of Exercise per Session: 30 min  Stress: No Stress Concern Present (10/22/2022)   [employer] of Occupational Health - Occupational Stress Questionnaire    Feeling of Stress : Only a little  Social Connections: Socially Integrated (02/13/2023)   Social Connection and Isolation Panel [NHANES]    Frequency of Communication with Friends and Family: More than three times a week    Frequency of Social Gatherings with Friends and Family: More than three times a week    Attends Religious Services: More than 4 times per year    Active Member of [employer] or Organizations: Yes    Attends Banker Meetings: More than 4 times per year  Marital Status: Married  Catering manager Violence: Not At Risk (02/13/2023)   Humiliation, Afraid, Rape, and Kick questionnaire    Fear of Current or Ex-Partner: No    Emotionally Abused: No    Physically Abused: No    Sexually Abused: No    Outpatient Encounter Medications as of 01/08/2024  Medication Sig   albuterol  (VENTOLIN  HFA) 108 (90 Base) MCG/ACT inhaler Inhale 2 puffs into the lungs every 6 (six) hours as needed for wheezing (cough).   aspirin  81 MG chewable tablet Chew 81 mg by mouth daily.   cholecalciferol  (VITAMIN D3) 25 MCG (1000 UNIT) tablet Take 1,000 Units by mouth daily.    fluticasone (FLONASE) 50 MCG/ACT nasal spray Place 2 sprays into both nostrils daily.   HYDROcodone -acetaminophen  (NORCO) 7.5-325 MG tablet 1 tablet every 4 (four) hours as needed (pain) for up to 30 doses.   nitroGLYCERIN  (NITROSTAT ) 0.4 MG SL tablet Place 1 tablet (0.4 mg total) under the tongue every 5 (five) minutes as needed for chest pain.   predniSONE  (DELTASONE ) 20 MG tablet Take 2 tablets (40 mg total) by mouth daily with breakfast for 5 days.   tadalafil  (CIALIS ) 10 MG tablet TAKE 1 TO 2 TABLETS AS DIRECTED AS NEEDED   triamcinolone  cream (KENALOG ) 0.1 % Apply 1 Application topically 2 (two) times daily.   [DISCONTINUED] losartan  (COZAAR ) 50 MG tablet TAKE  ONE TABLET ONCE DAILY   [DISCONTINUED] rosuvastatin  (CRESTOR ) 20 MG tablet TAKE ONE TABLET ONCE DAILY   losartan  (COZAAR ) 50 MG tablet Take 1 tablet (50 mg total) by mouth daily.   rosuvastatin  (CRESTOR ) 20 MG tablet Take 1 tablet (20 mg total) by mouth daily.   [DISCONTINUED] metoprolol  succinate (TOPROL  XL) 25 MG 24 hr tablet Take 0.5 tablets (12.5 mg total) by mouth daily.   [DISCONTINUED] predniSONE  (DELTASONE ) 20 MG tablet 2 po at same time daily for 5 days   No facility-administered encounter medications on file as of 01/08/2024.    Allergies  Allergen Reactions   Carvedilol      diarrhea   Lipitor [Atorvastatin] Other (See Comments)    "Makes pt feel funky"   Plavix  [Clopidogrel ] Rash    Pertinent ROS per HPI, otherwise unremarkable      Objective:  BP 134/69   Pulse 86   Temp (!) 97.4 F (36.3 C)   Ht 5\' 6"  (1.676 m)   Wt 138 lb 12.8 oz (63 kg)   SpO2 96%   BMI 22.40 kg/m    Wt Readings from Last 3 Encounters:  01/08/24 138 lb 12.8 oz (63 kg)  09/17/23 142 lb (64.4 kg)  02/13/23 148 lb (67.1 kg)    Physical Exam Vitals and nursing note reviewed.  Constitutional:      General: He is not in acute distress.    Appearance: Normal appearance. He is normal weight. He is not ill-appearing, toxic-appearing or diaphoretic.  HENT:     Head: Normocephalic and atraumatic.     Right Ear: A middle ear effusion is present. Tympanic membrane is not erythematous.     Left Ear: A middle ear effusion is present. Tympanic membrane is not erythematous.     Nose: Congestion present.     Right Sinus: No maxillary sinus tenderness or frontal sinus tenderness.     Left Sinus: No maxillary sinus tenderness or frontal sinus tenderness.     Mouth/Throat:     Lips: Pink.     Mouth: Mucous membranes are moist.     Dentition: Abnormal dentition.  Pharynx: Postnasal drip present. No pharyngeal swelling, oropharyngeal exudate, posterior oropharyngeal erythema or uvula swelling.   Eyes:     Conjunctiva/sclera: Conjunctivae normal.     Pupils: Pupils are equal, round, and reactive to light.  Neck:     Vascular: No carotid bruit.  Cardiovascular:     Rate and Rhythm: Normal rate and regular rhythm.     Heart sounds: Normal heart sounds.  Pulmonary:     Effort: Pulmonary effort is normal.     Breath sounds: Wheezing present. No rhonchi or rales.     Comments: Nonproductive cough Musculoskeletal:     Cervical back: Normal range of motion and neck supple.     Thoracic back: Normal.     Lumbar back: No swelling, edema, deformity, signs of trauma, lacerations, spasms, tenderness or bony tenderness. Decreased range of motion. Positive left straight leg raise test. Negative right straight leg raise test. No scoliosis.     Right hip: Normal.     Left hip: Normal.     Right lower leg: No edema.     Left lower leg: No edema.  Lymphadenopathy:     Cervical: No cervical adenopathy.  Skin:    General: Skin is warm and dry.     Capillary Refill: Capillary refill takes less than 2 seconds.  Neurological:     General: No focal deficit present.     Mental Status: He is alert and oriented to person, place, and time.  Psychiatric:        Mood and Affect: Mood normal.        Behavior: Behavior normal. Behavior is cooperative.        Thought Content: Thought content normal.        Judgment: Judgment normal.      Results for orders placed or performed in visit on 07/09/22  CBC with Differential/Platelet   Collection Time: 07/09/22  9:30 AM  Result Value Ref Range   WBC 7.4 3.4 - 10.8 x10E3/uL   RBC 4.84 4.14 - 5.80 x10E6/uL   Hemoglobin 15.4 13.0 - 17.7 g/dL   Hematocrit 65.7 84.6 - 51.0 %   MCV 95 79 - 97 fL   MCH 31.8 26.6 - 33.0 pg   MCHC 33.5 31.5 - 35.7 g/dL   RDW 96.2 95.2 - 84.1 %   Platelets 183 150 - 450 x10E3/uL   Neutrophils 61 Not Estab. %   Lymphs 24 Not Estab. %   Monocytes 9 Not Estab. %   Eos 5 Not Estab. %   Basos 1 Not Estab. %   Neutrophils  Absolute 4.6 1.4 - 7.0 x10E3/uL   Lymphocytes Absolute 1.8 0.7 - 3.1 x10E3/uL   Monocytes Absolute 0.7 0.1 - 0.9 x10E3/uL   EOS (ABSOLUTE) 0.4 0.0 - 0.4 x10E3/uL   Basophils Absolute 0.1 0.0 - 0.2 x10E3/uL   Immature Granulocytes 0 Not Estab. %   Immature Grans (Abs) 0.0 0.0 - 0.1 x10E3/uL  CMP14+EGFR   Collection Time: 07/09/22  9:30 AM  Result Value Ref Range   Glucose 72 70 - 99 mg/dL   BUN 20 8 - 27 mg/dL   Creatinine, Ser 3.24 0.76 - 1.27 mg/dL   eGFR 89 >40 NU/UVO/5.36   BUN/Creatinine Ratio 23 10 - 24   Sodium 140 134 - 144 mmol/L   Potassium 4.6 3.5 - 5.2 mmol/L   Chloride 102 96 - 106 mmol/L   CO2 24 20 - 29 mmol/L   Calcium  9.7 8.6 - 10.2 mg/dL  Total Protein 6.7 6.0 - 8.5 g/dL   Albumin 4.2 3.8 - 4.8 g/dL   Globulin, Total 2.5 1.5 - 4.5 g/dL   Albumin/Globulin Ratio 1.7 1.2 - 2.2   Bilirubin Total 0.5 0.0 - 1.2 mg/dL   Alkaline Phosphatase 80 44 - 121 IU/L   AST 19 0 - 40 IU/L   ALT 19 0 - 44 IU/L  Lipid panel   Collection Time: 07/09/22  9:30 AM  Result Value Ref Range   Cholesterol, Total 101 100 - 199 mg/dL   Triglycerides 75 0 - 149 mg/dL   HDL 39 (L) >60 mg/dL   VLDL Cholesterol Cal 16 5 - 40 mg/dL   LDL Chol Calc (NIH) 46 0 - 99 mg/dL   Chol/HDL Ratio 2.6 0.0 - 5.0 ratio  Thyroid  Panel With TSH   Collection Time: 07/09/22  9:30 AM  Result Value Ref Range   TSH 0.925 0.450 - 4.500 uIU/mL   T4, Total 6.8 4.5 - 12.0 ug/dL   T3 Uptake Ratio 29 24 - 39 %   Free Thyroxine Index 2.0 1.2 - 4.9  VITAMIN D  25 Hydroxy (Vit-D Deficiency, Fractures)   Collection Time: 07/09/22  9:30 AM  Result Value Ref Range   Vit D, 25-Hydroxy 32.0 30.0 - 100.0 ng/mL  PSA, total and free   Collection Time: 07/09/22  9:30 AM  Result Value Ref Range   Prostate Specific Ag, Serum 0.5 0.0 - 4.0 ng/mL   PSA, Free 0.15 N/A ng/mL   PSA, Free Pct 30.0 %       Pertinent labs & imaging results that were available during my care of the patient were reviewed by me and considered in  my medical decision making.  Assessment & Plan:  [name] [name] was seen today for medical management of chronic issues and trouble sleeping.  Diagnoses and all orders for this visit:  HFrEF (heart failure with reduced ejection fraction) (HCC) -     CBC with Differential/Platelet -     CMP14+EGFR  History of non-ST elevation myocardial infarction (NSTEMI) -     CBC with Differential/Platelet -     CMP14+EGFR -     Lipid panel -     rosuvastatin  (CRESTOR ) 20 MG tablet; Take 1 tablet (20 mg total) by mouth daily. -     losartan  (COZAAR ) 50 MG tablet; Take 1 tablet (50 mg total) by mouth daily.  Coronary artery disease involving native coronary artery of native heart without angina pectoris -     CBC with Differential/Platelet -     CMP14+EGFR -     Lipid panel -     rosuvastatin  (CRESTOR ) 20 MG tablet; Take 1 tablet (20 mg total) by mouth daily. -     losartan  (COZAAR ) 50 MG tablet; Take 1 tablet (50 mg total) by mouth daily.  Mixed hyperlipidemia -     CMP14+EGFR -     Lipid panel -     rosuvastatin  (CRESTOR ) 20 MG tablet; Take 1 tablet (20 mg total) by mouth daily.  Primary hypertension -     CBC with Differential/Platelet -     CMP14+EGFR -     Lipid panel -     Thyroid  Panel With TSH -     losartan  (COZAAR ) 50 MG tablet; Take 1 tablet (50 mg total) by mouth daily.  Vitamin D  deficiency -     CMP14+EGFR -     Vitamin D , 25-hydroxy  Seasonal allergic rhinitis due to pollen -  albuterol  (VENTOLIN  HFA) 108 (90 Base) MCG/ACT inhaler; Inhale 2 puffs into the lungs every 6 (six) hours as needed for wheezing (cough). -     fluticasone  (FLONASE ) 50 MCG/ACT nasal spray; Place 2 sprays into both nostrils daily.  Left sided sciatica -     predniSONE  (DELTASONE ) 20 MG tablet; Take 2 tablets (40 mg total) by mouth daily with breakfast for 5 days.  Wheezing -     albuterol  (VENTOLIN  HFA) 108 (90 Base) MCG/ACT inhaler; Inhale 2 puffs into the lungs every 6 (six) hours as needed for  wheezing (cough). -     predniSONE  (DELTASONE ) 20 MG tablet; Take 2 tablets (40 mg total) by mouth daily with breakfast for 5 days.     Assessment and Plan    Hypertension Hypertension management is complicated by recent medication non-adherence due to a lapse in prescription refills. Blood pressure was elevated, likely due to running out of losartan  for 4-5 days. Medication adherence is crucial to prevent further complications. - Send in 90-day prescription for losartan  with three refills to [facility].  Heart failure Heart failure management is ongoing. No current symptoms of chest pain, shortness of breath, or leg swelling reported. Continued adherence to heart failure medications is necessary to prevent exacerbations. - Ensure continued adherence to heart failure medications.  Hyperlipidemia Hyperlipidemia management is ongoing with rosuvastatin . There was a recent lapse in medication adherence due to a delay in prescription refill, which may have contributed to elevated blood pressure. Medication adherence is essential for effective lipid control. - Send in 90-day prescription for rosuvastatin  with three refills to [facility]. - Check cholesterol levels today.  Allergic rhinitis Allergic rhinitis exacerbated by high pollen count, leading to significant nasal drainage, cough, and wheezing. He prefers non-pharmacological management but is open to trying medications for symptom relief. Albuterol  inhaler and Flonase  nasal spray are recommended to manage symptoms. Prednisone  is prescribed to reduce inflammation and wheezing. - Prescribe albuterol  inhaler to use as needed for wheezing. - Prescribe Flonase  nasal spray with instructions for proper use. - Prescribe prednisone  2 pills every morning for 5 days to reduce inflammation and wheezing. - Advise use of local honey with wooden or plastic spoon to avoid killing spores.  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 Symptoms may be exacerbated by physical activity and bending over. Prednisone  may help alleviate leg pain related to possible lower back inflammation. - Prescribe prednisone , which may also help alleviate leg pain related to possible lower back inflammation.          Continue all other maintenance medications.  Follow up plan: Return in about 6 months (around 07/09/2024) for Annual Physical.   Continue healthy lifestyle choices, including diet (rich in fruits, vegetables, and lean proteins, and low in salt and simple carbohydrates) and exercise (at least 30 minutes of moderate physical activity daily).  Educational handout given for health maintenance   The above assessment and management plan was discussed with the patient. The patient verbalized understanding of and has agreed to the management plan. Patient is aware to call the clinic if they develop any new symptoms or if symptoms persist or worsen. Patient is aware when to return to the clinic for a follow-up visit. Patient educated on when it is appropriate to go to the emergency department.   Kattie Parrot, FNP-C [facility] [phone]

## 2024-01-09 LAB — CBC WITH DIFFERENTIAL/PLATELET
Basophils Absolute: 0 10*3/uL (ref 0.0–0.2)
Basos: 0 %
EOS (ABSOLUTE): 0.1 10*3/uL (ref 0.0–0.4)
Eos: 2 %
Hematocrit: 42.5 % (ref 37.5–51.0)
Hemoglobin: 14 g/dL (ref 13.0–17.7)
Immature Grans (Abs): 0 10*3/uL (ref 0.0–0.1)
Immature Granulocytes: 0 %
Lymphocytes Absolute: 1.5 10*3/uL (ref 0.7–3.1)
Lymphs: 17 %
MCH: 29.3 pg (ref 26.6–33.0)
MCHC: 32.9 g/dL (ref 31.5–35.7)
MCV: 89 fL (ref 79–97)
Monocytes Absolute: 0.8 10*3/uL (ref 0.1–0.9)
Monocytes: 9 %
Neutrophils Absolute: 6.2 10*3/uL (ref 1.4–7.0)
Neutrophils: 72 %
Platelets: 211 10*3/uL (ref 150–450)
RBC: 4.78 x10E6/uL (ref 4.14–5.80)
RDW: 12.8 % (ref 11.6–15.4)
WBC: 8.7 10*3/uL (ref 3.4–10.8)

## 2024-01-09 LAB — VITAMIN D 25 HYDROXY (VIT D DEFICIENCY, FRACTURES): Vit D, 25-Hydroxy: 23.5 ng/mL — ABNORMAL LOW (ref 30.0–100.0)

## 2024-01-09 LAB — CMP14+EGFR
ALT: 14 IU/L (ref 0–44)
AST: 17 IU/L (ref 0–40)
Albumin: 3.9 g/dL (ref 3.8–4.8)
Alkaline Phosphatase: 87 IU/L (ref 44–121)
BUN/Creatinine Ratio: 14 (ref 10–24)
BUN: 11 mg/dL (ref 8–27)
Bilirubin Total: 0.8 mg/dL (ref 0.0–1.2)
CO2: 22 mmol/L (ref 20–29)
Calcium: 8.9 mg/dL (ref 8.6–10.2)
Chloride: 99 mmol/L (ref 96–106)
Creatinine, Ser: 0.79 mg/dL (ref 0.76–1.27)
Globulin, Total: 2.3 g/dL (ref 1.5–4.5)
Glucose: 95 mg/dL (ref 70–99)
Potassium: 4.1 mmol/L (ref 3.5–5.2)
Sodium: 136 mmol/L (ref 134–144)
Total Protein: 6.2 g/dL (ref 6.0–8.5)
eGFR: 91 mL/min/{1.73_m2} (ref >59)

## 2024-01-09 LAB — LIPID PANEL
Chol/HDL Ratio: 2.7 ratio (ref 0.0–5.0)
Cholesterol, Total: 108 mg/dL (ref 100–199)
HDL: 40 mg/dL (ref >39)
LDL Chol Calc (NIH): 54 mg/dL (ref 0–99)
Triglycerides: 61 mg/dL (ref 0–149)
VLDL Cholesterol Cal: 14 mg/dL (ref 5–40)

## 2024-01-09 LAB — THYROID PANEL WITH TSH
Free Thyroxine Index: 2.5 (ref 1.2–4.9)
T3 Uptake Ratio: 33 % (ref 24–39)
T4, Total: 7.6 ug/dL (ref 4.5–12.0)
TSH: 1.04 u[IU]/mL (ref 0.450–4.500)

## 2024-01-14 DIAGNOSIS — D485 Neoplasm of uncertain behavior of skin: Secondary | ICD-10-CM | POA: Diagnosis not present

## 2024-01-14 DIAGNOSIS — D225 Melanocytic nevi of trunk: Secondary | ICD-10-CM | POA: Diagnosis not present

## 2024-01-14 DIAGNOSIS — L821 Other seborrheic keratosis: Secondary | ICD-10-CM | POA: Diagnosis not present

## 2024-01-14 DIAGNOSIS — L570 Actinic keratosis: Secondary | ICD-10-CM | POA: Diagnosis not present

## 2024-01-14 DIAGNOSIS — X32XXXD Exposure to sunlight, subsequent encounter: Secondary | ICD-10-CM | POA: Diagnosis not present

## 2024-01-26 ENCOUNTER — Other Ambulatory Visit: Payer: Self-pay

## 2024-01-26 ENCOUNTER — Inpatient Hospital Stay (HOSPITAL_COMMUNITY)
Admission: EM | Admit: 2024-01-26 | Discharge: 2024-02-09 | DRG: 330 | Disposition: A | Discharge status: Home Health | Attending: Internal Medicine | Admitting: Internal Medicine

## 2024-01-26 ENCOUNTER — Emergency Department (HOSPITAL_COMMUNITY)

## 2024-01-26 ENCOUNTER — Ambulatory Visit: Payer: Self-pay

## 2024-01-26 ENCOUNTER — Encounter (HOSPITAL_COMMUNITY): Payer: Self-pay

## 2024-01-26 DIAGNOSIS — K5720 Diverticulitis of large intestine with perforation and abscess without bleeding: Secondary | ICD-10-CM | POA: Insufficient documentation

## 2024-01-26 DIAGNOSIS — Z955 Presence of coronary angioplasty implant and graft: Secondary | ICD-10-CM

## 2024-01-26 DIAGNOSIS — I745 Embolism and thrombosis of iliac artery: Secondary | ICD-10-CM | POA: Diagnosis present

## 2024-01-26 DIAGNOSIS — I472 Ventricular tachycardia, unspecified: Secondary | ICD-10-CM | POA: Diagnosis not present

## 2024-01-26 DIAGNOSIS — Z8249 Family history of ischemic heart disease and other diseases of the circulatory system: Secondary | ICD-10-CM | POA: Diagnosis not present

## 2024-01-26 DIAGNOSIS — Z888 Allergy status to other drugs, medicaments and biological substances status: Secondary | ICD-10-CM | POA: Diagnosis not present

## 2024-01-26 DIAGNOSIS — C189 Malignant neoplasm of colon, unspecified: Secondary | ICD-10-CM | POA: Diagnosis not present

## 2024-01-26 DIAGNOSIS — I739 Peripheral vascular disease, unspecified: Secondary | ICD-10-CM

## 2024-01-26 DIAGNOSIS — I252 Old myocardial infarction: Secondary | ICD-10-CM

## 2024-01-26 DIAGNOSIS — R918 Other nonspecific abnormal finding of lung field: Secondary | ICD-10-CM | POA: Diagnosis not present

## 2024-01-26 DIAGNOSIS — M549 Dorsalgia, unspecified: Secondary | ICD-10-CM | POA: Diagnosis not present

## 2024-01-26 DIAGNOSIS — I5020 Unspecified systolic (congestive) heart failure: Secondary | ICD-10-CM

## 2024-01-26 DIAGNOSIS — Z91148 Patient's other noncompliance with medication regimen for other reason: Secondary | ICD-10-CM

## 2024-01-26 DIAGNOSIS — E871 Hypo-osmolality and hyponatremia: Secondary | ICD-10-CM | POA: Diagnosis present

## 2024-01-26 DIAGNOSIS — I110 Hypertensive heart disease with heart failure: Secondary | ICD-10-CM | POA: Diagnosis present

## 2024-01-26 DIAGNOSIS — K56609 Unspecified intestinal obstruction, unspecified as to partial versus complete obstruction: Secondary | ICD-10-CM | POA: Diagnosis not present

## 2024-01-26 DIAGNOSIS — R079 Chest pain, unspecified: Secondary | ICD-10-CM | POA: Diagnosis not present

## 2024-01-26 DIAGNOSIS — I2510 Atherosclerotic heart disease of native coronary artery without angina pectoris: Secondary | ICD-10-CM | POA: Diagnosis not present

## 2024-01-26 DIAGNOSIS — Z79899 Other long term (current) drug therapy: Secondary | ICD-10-CM

## 2024-01-26 DIAGNOSIS — R1010 Upper abdominal pain, unspecified: Secondary | ICD-10-CM | POA: Diagnosis not present

## 2024-01-26 DIAGNOSIS — Z833 Family history of diabetes mellitus: Secondary | ICD-10-CM

## 2024-01-26 DIAGNOSIS — C187 Malignant neoplasm of sigmoid colon: Principal | ICD-10-CM | POA: Diagnosis present

## 2024-01-26 DIAGNOSIS — I5022 Chronic systolic (congestive) heart failure: Secondary | ICD-10-CM | POA: Diagnosis not present

## 2024-01-26 DIAGNOSIS — R112 Nausea with vomiting, unspecified: Secondary | ICD-10-CM | POA: Diagnosis not present

## 2024-01-26 DIAGNOSIS — J90 Pleural effusion, not elsewhere classified: Secondary | ICD-10-CM | POA: Diagnosis not present

## 2024-01-26 DIAGNOSIS — J439 Emphysema, unspecified: Secondary | ICD-10-CM | POA: Diagnosis not present

## 2024-01-26 DIAGNOSIS — I771 Stricture of artery: Secondary | ICD-10-CM

## 2024-01-26 DIAGNOSIS — R0789 Other chest pain: Secondary | ICD-10-CM | POA: Diagnosis not present

## 2024-01-26 DIAGNOSIS — Z87891 Personal history of nicotine dependence: Secondary | ICD-10-CM | POA: Diagnosis not present

## 2024-01-26 DIAGNOSIS — I10 Essential (primary) hypertension: Secondary | ICD-10-CM

## 2024-01-26 DIAGNOSIS — E782 Mixed hyperlipidemia: Secondary | ICD-10-CM

## 2024-01-26 DIAGNOSIS — N400 Enlarged prostate without lower urinary tract symptoms: Secondary | ICD-10-CM | POA: Diagnosis not present

## 2024-01-26 DIAGNOSIS — I959 Hypotension, unspecified: Secondary | ICD-10-CM | POA: Diagnosis not present

## 2024-01-26 DIAGNOSIS — N281 Cyst of kidney, acquired: Secondary | ICD-10-CM | POA: Diagnosis not present

## 2024-01-26 DIAGNOSIS — Z01810 Encounter for preprocedural cardiovascular examination: Secondary | ICD-10-CM | POA: Diagnosis not present

## 2024-01-26 DIAGNOSIS — Z8612 Personal history of poliomyelitis: Secondary | ICD-10-CM | POA: Diagnosis not present

## 2024-01-26 DIAGNOSIS — K5792 Diverticulitis of intestine, part unspecified, without perforation or abscess without bleeding: Principal | ICD-10-CM

## 2024-01-26 DIAGNOSIS — K7689 Other specified diseases of liver: Secondary | ICD-10-CM | POA: Diagnosis not present

## 2024-01-26 DIAGNOSIS — I493 Ventricular premature depolarization: Secondary | ICD-10-CM | POA: Diagnosis not present

## 2024-01-26 DIAGNOSIS — Z7982 Long term (current) use of aspirin: Secondary | ICD-10-CM | POA: Diagnosis not present

## 2024-01-26 DIAGNOSIS — R109 Unspecified abdominal pain: Secondary | ICD-10-CM | POA: Diagnosis not present

## 2024-01-26 DIAGNOSIS — I5089 Other heart failure: Secondary | ICD-10-CM | POA: Diagnosis not present

## 2024-01-26 DIAGNOSIS — Z4789 Encounter for other orthopedic aftercare: Secondary | ICD-10-CM | POA: Diagnosis not present

## 2024-01-26 DIAGNOSIS — R531 Weakness: Secondary | ICD-10-CM | POA: Diagnosis not present

## 2024-01-26 DIAGNOSIS — I513 Intracardiac thrombosis, not elsewhere classified: Secondary | ICD-10-CM | POA: Diagnosis not present

## 2024-01-26 LAB — CBC
HCT: 42.4 % (ref 39.0–52.0)
Hemoglobin: 14.3 g/dL (ref 13.0–17.0)
MCH: 28.9 pg (ref 26.0–34.0)
MCHC: 33.7 g/dL (ref 30.0–36.0)
MCV: 85.8 fL (ref 80.0–100.0)
Platelets: 242 10*3/uL (ref 150–400)
RBC: 4.94 MIL/uL (ref 4.22–5.81)
RDW: 12.8 % (ref 11.5–15.5)
WBC: 13.5 10*3/uL — ABNORMAL HIGH (ref 4.0–10.5)
nRBC: 0 % (ref 0.0–0.2)

## 2024-01-26 LAB — I-STAT CG4 LACTIC ACID, ED: Lactic Acid, Venous: 1.1 mmol/L (ref 0.5–1.9)

## 2024-01-26 LAB — COMPREHENSIVE METABOLIC PANEL WITH GFR
ALT: 17 U/L (ref 0–44)
AST: 18 U/L (ref 15–41)
Albumin: 3 g/dL — ABNORMAL LOW (ref 3.5–5.0)
Alkaline Phosphatase: 60 U/L (ref 38–126)
Anion gap: 8 (ref 5–15)
BUN: 17 mg/dL (ref 8–23)
CO2: 25 mmol/L (ref 22–32)
Calcium: 8.6 mg/dL — ABNORMAL LOW (ref 8.9–10.3)
Chloride: 98 mmol/L (ref 98–111)
Creatinine, Ser: 0.74 mg/dL (ref 0.61–1.24)
GFR, Estimated: >60 mL/min (ref >60)
Glucose, Bld: 97 mg/dL (ref 70–99)
Potassium: 3.9 mmol/L (ref 3.5–5.1)
Sodium: 131 mmol/L — ABNORMAL LOW (ref 135–145)
Total Bilirubin: 1.3 mg/dL — ABNORMAL HIGH (ref 0.0–1.2)
Total Protein: 6.3 g/dL — ABNORMAL LOW (ref 6.5–8.1)

## 2024-01-26 LAB — URINALYSIS, ROUTINE W REFLEX MICROSCOPIC
Bilirubin Urine: NEGATIVE
Glucose, UA: NEGATIVE mg/dL
Hgb urine dipstick: NEGATIVE
Ketones, ur: 20 mg/dL — AB
Leukocytes,Ua: NEGATIVE
Nitrite: NEGATIVE
Protein, ur: NEGATIVE mg/dL
Specific Gravity, Urine: >1.046 — ABNORMAL HIGH (ref 1.005–1.030)
pH: 6 (ref 5.0–8.0)

## 2024-01-26 LAB — LIPASE, BLOOD: Lipase: 28 U/L (ref 11–51)

## 2024-01-26 LAB — TROPONIN I (HIGH SENSITIVITY)
Troponin I (High Sensitivity): 13 ng/L (ref <18)
Troponin I (High Sensitivity): 13 ng/L (ref <18)

## 2024-01-26 MED ORDER — IOHEXOL 350 MG/ML SOLN
75.0000 mL | Freq: Once | INTRAVENOUS | Status: AC | PRN
  Administered 2024-01-26: 75 mL via INTRAVENOUS

## 2024-01-26 MED ORDER — HYDROCODONE-ACETAMINOPHEN 5-325 MG PO TABS
1.0000 | ORAL_TABLET | ORAL | Status: DC | PRN
  Administered 2024-01-28 – 2024-02-01 (×12): 1 via ORAL
  Filled 2024-01-26 (×12): qty 1

## 2024-01-26 MED ORDER — HYDROMORPHONE HCL 1 MG/ML IJ SOLN
0.5000 mg | INTRAMUSCULAR | Status: DC | PRN
  Administered 2024-01-31 – 2024-02-05 (×5): 0.5 mg via INTRAVENOUS
  Filled 2024-01-26 (×5): qty 0.5

## 2024-01-26 MED ORDER — ROSUVASTATIN CALCIUM 20 MG PO TABS
20.0000 mg | ORAL_TABLET | Freq: Every day | ORAL | Status: DC
Start: 2024-01-27 — End: 2024-02-09
  Administered 2024-01-27 – 2024-02-09 (×14): 20 mg via ORAL
  Filled 2024-01-26 (×14): qty 1

## 2024-01-26 MED ORDER — LOSARTAN POTASSIUM 50 MG PO TABS
50.0000 mg | ORAL_TABLET | Freq: Every day | ORAL | Status: DC
  Administered 2024-01-27 – 2024-02-02 (×7): 50 mg via ORAL
  Filled 2024-01-26 (×8): qty 1

## 2024-01-26 MED ORDER — SODIUM CHLORIDE 0.9 % IV SOLN: INTRAVENOUS | Status: AC

## 2024-01-26 MED ORDER — ACETAMINOPHEN 325 MG PO TABS
650.0000 mg | ORAL_TABLET | Freq: Four times a day (QID) | ORAL | Status: DC | PRN
  Administered 2024-01-27 – 2024-02-01 (×4): 650 mg via ORAL
  Filled 2024-01-26 (×5): qty 2

## 2024-01-26 MED ORDER — SODIUM CHLORIDE 0.9% FLUSH
3.0000 mL | Freq: Two times a day (BID) | INTRAVENOUS | Status: DC
Start: 2024-01-26 — End: 2024-02-09
  Administered 2024-01-26 – 2024-02-09 (×25): 3 mL via INTRAVENOUS

## 2024-01-26 MED ORDER — PIPERACILLIN-TAZOBACTAM 3.375 G IVPB
3.3750 g | Freq: Three times a day (TID) | INTRAVENOUS | Status: DC
  Administered 2024-01-26 – 2024-02-01 (×18): 3.375 g via INTRAVENOUS
  Filled 2024-01-26 (×18): qty 50

## 2024-01-26 MED ORDER — POLYETHYLENE GLYCOL 3350 17 G PO PACK: 17.0000 g | PACK | Freq: Every day | ORAL | Status: DC | PRN

## 2024-01-26 MED ORDER — PIPERACILLIN-TAZOBACTAM 3.375 G IVPB 30 MIN
3.3750 g | Freq: Once | INTRAVENOUS | Status: AC
  Administered 2024-01-26: 3.375 g via INTRAVENOUS
  Filled 2024-01-26: qty 50

## 2024-01-26 MED ORDER — ASPIRIN 81 MG PO CHEW
81.0000 mg | CHEWABLE_TABLET | Freq: Every day | ORAL | Status: DC
  Administered 2024-01-27 – 2024-01-28 (×2): 81 mg via ORAL
  Filled 2024-01-26 (×2): qty 1

## 2024-01-26 MED ORDER — ALBUTEROL SULFATE (2.5 MG/3ML) 0.083% IN NEBU: 3.0000 mL | INHALATION_SOLUTION | Freq: Four times a day (QID) | RESPIRATORY_TRACT | Status: DC | PRN

## 2024-01-26 MED ORDER — ACETAMINOPHEN 650 MG RE SUPP: 650.0000 mg | Freq: Four times a day (QID) | RECTAL | Status: DC | PRN

## 2024-01-26 NOTE — Telephone Encounter (Signed)
 Copied from CRM [id number]. Topic: Clinical - Red Word Triage >> Jan 26, 2024 10:58 AM Sophia H wrote: Red Word that prompted transfer to Nurse Triage: Pt states has been having back/stomach pain for the last 6 days, has become severe now & today he noticed a bit of chest pain as well. Unsure if he should call ambulance given his history of heart attacks in the past. Please advise  Chief Complaint: Chest pain Symptoms: Severe abdominal and back pain, "feverish", not able to eat Frequency: Chest pain today, other symptoms 6 days Pertinent Negatives: Patient denies difficulty breathing Disposition: [x] ED /[] Urgent Care (no appt availability in office) / [] Appointment(In office/virtual)/ []  [place] [facility]/ [] Home Care/ [] Refused Recommended Disposition /[] [place] [facility]/ []  Follow-up with PCP Additional Notes: Patient called in to report severe chest pains that started this morning. Patient stated he is not currently experiencing chest pains, as the pains come and go and stay for only seconds at a time. Patient rated pain 8-9 and described pain as "sharp". Patient is concerned because he stated he has a history of MI x 4. Patient also complained of severe abdominal and back pain. Patient stated this pain has been present for 6 days. Patient rated this pain 8-9, as well. Patient stated he feels feverish. Patient stated all he can keep down is a boiled egg. Patient stated he lives alone and doesn't feel safe driving anywhere. Advised ED, per protocol. This RN called 911 for patient. This RN confirmed with patient that paramedics are on the way. This RN offered to stay on the line with patient until paramedics arrive. Patient declined. Provided care advice and instructed patient to call back if symptoms worsen. Patient complied.   Reason for Disposition  Patient sounds very sick or weak to the triager  Answer Assessment - Initial Assessment Questions 1. LOCATION: "Where does it hurt?"        States his chest is not hurting at this time, but sharp pains come and go for seconds at a time 2. RADIATION: "Does the pain go anywhere else?" (e.g., into neck, jaw, arms, back)    States he has been experiencing severe abdominal and back pain for the past 6 days 3. ONSET: "When did the chest pain begin?" (Minutes, hours or days)      Chest pain started this morning, severe abdominal and back pain have been present for 6 days 4. PATTERN: "Does the pain come and go, or has it been constant since it started?"  "Does it get worse with exertion?"      States pain comes and goes, stays for seconds at a time 5. DURATION: "How long does it last" (e.g., seconds, minutes, hours)     Seconds at a time 6. SEVERITY: "How bad is the pain?"  (e.g., Scale 1-10; mild, moderate, or severe)    - MILD (1-3): doesn't interfere with normal activities     - MODERATE (4-7): interferes with normal activities or awakens from sleep    - SEVERE (8-10): excruciating pain, unable to do any normal activities       Rates pain 8-9 7. CARDIAC RISK FACTORS: "Do you have any history of heart problems or risk factors for heart disease?" (e.g., angina, prior heart attack; diabetes, high blood pressure, high cholesterol, smoker, or strong family history of heart disease)     States history of MI x 4 9. CAUSE: "What do you think is causing the chest pain?"     Unknown 10.  OTHER SYMPTOMS: "Do you have any other symptoms?" (e.g., dizziness, nausea, vomiting, sweating, fever, difficulty breathing, cough)       Reports severe lower abdominal pain for the past 6 days, vomiting x 1, states that all he has had to eat is a boiled egg Reports severe back pain that "has not let up" States he feels feverish, but does not have a temperature Denies issues with breathing  Protocols used: Chest Pain-A-AH

## 2024-01-26 NOTE — H&P (Addendum)
 History and Physical   [name] ZOX:[id number] DOB: 09/12/1946 [name]: 01/26/2024  PCP: Galvin Jules, FNP   Patient coming from: Home  Chief Complaint: Abdominal pain  HPI: Seymore Ayles. is a 78 y.o. male with medical history significant of hypertension, hyperlipidemia, CAD status post stent, chronic systolic CHF presenting with worsening abdominal pain.  Patient reports abdominal pain for the past 5 days.  Due to its persistent nature he came to the ED to be evaluated.  Denies fevers, chills, chest pain, shortness of breath, constipation, diarrhea, nausea, vomiting.  ED Course: Vital signs in the ED notable for blood pressure in the 120s-140 systolic.  Lab workup included CMP with sodium 131, calcium  8.6, protein 6.3, albumin 3.0, T. bili 1.3.  CBC with leukocytosis to 13.5.  Troponin negative x 1 with repeat pending.  Lipase normal.  Lactic acid pending.  Urinalysis pending.  Blood cultures pending.  Chest x-ray showed no acute abnormality.   CTA of the chest abdomen pelvis showed no evidence of aneurysm, dissection, pulmonary embolism.  There was noted to be 50% stenosis of the SMA, high-grade stenosis at the left common iliac and left common femoral artery of 70-90% as well as a left complete occlusion of the left external iliac artery with distal reconstitution.  Further iliac atherosclerosis noted on the right with stenosis in the 50-70% range.  In addition colonic wall thickening with evidence of diverticulitis with diverticular abscess and contained colonic wall perforation also noted.  In addition there was bronchial thickening, retaining stool, small hernias and pulmonary nodules.  General surgery consulted in the ED and agree with antibiotics with possible IR intervention to drain this abscess.  They will follow along.  Patient started on Zosyn.  Review of Systems: As per HPI otherwise all other systems reviewed and are negative.  Past Medical History:  Diagnosis  Date   Coronary artery disease    Erectile dysfunction    HFrEF (heart failure with reduced ejection fraction) (HCC)    Echocardiogram 07/14/21:  EF 25, global HK, inf HK worse, Gr 2 DD, normal RVSF, mild to mod MR, AV sclerosis w/o AS Echocardiogram 2/23: EF 25-30, global HK, GLS -12.9, normal RVSF, mild to mod MR, AV sclerosis w/o AS    History of hiatal hernia    a lone time ago   Hyperlipidemia    [place] fracture [facility]) 03/22/2020   Myocardial infarction (HCC)    x 2   [place]     Past Surgical History:  Procedure Laterality Date   APPENDECTOMY     CARDIAC CATHETERIZATION     has two stents placed   CERVICAL SPINE SURGERY     c3-4, 4-5,  5-6, 8 screws and plates   CORONARY STENT INTERVENTION N/A 07/15/2021   Procedure: CORONARY STENT INTERVENTION;  Surgeon: Avanell Leigh, MD;  Location: [facility];  Service: Cardiovascular;  Laterality: N/A;   CORONARY STENT PLACEMENT     forein body removal     metal sharp removed from right leg   LEFT HEART CATH AND CORONARY ANGIOGRAPHY N/A 11/15/2018   Procedure: LEFT HEART CATH AND CORONARY ANGIOGRAPHY;  Surgeon: Sammy Crisp, MD;  Location: [facility];  Service: Cardiovascular;  Laterality: N/A;   LEFT HEART CATH AND CORONARY ANGIOGRAPHY N/A 07/15/2021   Procedure: LEFT HEART CATH AND CORONARY ANGIOGRAPHY;  Surgeon: Avanell Leigh, MD;  Location: [facility];  Service: Cardiovascular;  Laterality: N/A;   MANDIBULAR  HARDWARE REMOVAL Bilateral 04/23/2020   Procedure: MANDIBULAR HARDWARE REMOVAL;  Surgeon: Janita Mellow, MD;  Location: [facility];  Service: ENT;  Laterality: Bilateral;   ORIF MANDIBULAR FRACTURE N/A 03/14/2020   Procedure: OPEN REDUCTION INTERNAL FIXATION (ORIF) MID  FACE FRACTURE, MANDIBULAR FIXATION MODIFIED ARCH BARS;  Surgeon: Janita Mellow, MD;  Location: [facility] OR;  Service: ENT;  Laterality: N/A;   SCAR REVISION N/A 04/23/2020   Procedure: SCAR REVISION/ Upper Lip Repair;  Surgeon:  Janita Mellow, MD;  Location: [facility];  Service: ENT;  Laterality: N/A;    Social History  reports that he has quit smoking. His smoking use included cigarettes. He has been exposed to tobacco smoke. He has never used smokeless tobacco. He reports current alcohol use of about 21.0 standard drinks of alcohol per week. He reports that he does not use drugs.  Allergies  Allergen Reactions   Carvedilol      diarrhea   Lipitor [Atorvastatin] Other (See Comments)    "Makes pt feel funky"   Plavix  [Clopidogrel ] Rash    Family History  Problem Relation Age of Onset   Diabetes Mother    Heart disease Mother    Heart disease Father   Reviewed on admission  Prior to Admission medications   Medication Sig Start Date End Date Taking? Authorizing Provider  albuterol  (VENTOLIN  HFA) 108 (90 Base) MCG/ACT inhaler Inhale 2 puffs into the lungs every 6 (six) hours as needed for wheezing (cough). 01/08/24   Galvin Jules, FNP  aspirin  81 MG chewable tablet Chew 81 mg by mouth daily.    [provider]  augmented betamethasone dipropionate (DIPROLENE-AF) 0.05 % cream Apply 1 Application topically. 12/07/23   [provider]  cholecalciferol  (VITAMIN D3) 25 MCG (1000 UNIT) tablet Take 1,000 Units by mouth daily.     [provider]  fluticasone  (FLONASE ) 50 MCG/ACT nasal spray Place 2 sprays into both nostrils daily. 01/08/24   Galvin Jules, FNP  HYDROcodone -acetaminophen  (NORCO) 7.5-325 MG tablet 1 tablet every 4 (four) hours as needed (pain) for up to 30 doses. 03/22/20   [provider]  ketoconazole (NIZORAL) 2 % cream Apply 1 Application topically 2 (two) times daily. 12/07/23   [provider]  losartan  (COZAAR ) 50 MG tablet Take 1 tablet (50 mg total) by mouth daily. 01/08/24   Galvin Jules, FNP  nitroGLYCERIN  (NITROSTAT ) 0.4 MG SL tablet Place 1 tablet (0.4 mg total) under the tongue every 5 (five) minutes as needed for chest pain. 10/15/21    Zorita Hiss, FNP  rosuvastatin  (CRESTOR ) 20 MG tablet Take 1 tablet (20 mg total) by mouth daily. 01/08/24   Galvin Jules, FNP  tadalafil  (CIALIS ) 10 MG tablet TAKE 1 TO 2 TABLETS AS DIRECTED AS NEEDED 09/25/21   Zorita Hiss, FNP  triamcinolone  cream (KENALOG ) 0.1 % Apply 1 Application topically 2 (two) times daily. 09/17/23   Dettinger, Lucio Sabin, MD    Physical Exam: Vitals:   01/26/24 1241 01/26/24 1245 01/26/24 1405  BP: 124/76  (!) 142/78  Pulse: 96  85  Resp: 18  15  Temp: 98.8 F (37.1 C)    TempSrc: Oral    SpO2: 97%  100%  Weight:  67.1 kg   Height:  5' 6.5" (1.689 m)     Physical Exam Constitutional:      General: He is not in acute distress.    Appearance: Normal appearance.  HENT:     Head: Normocephalic  and atraumatic.     Mouth/Throat:     Mouth: Mucous membranes are moist.     Pharynx: Oropharynx is clear.  Eyes:     Extraocular Movements: Extraocular movements intact.     Pupils: Pupils are equal, round, and reactive to light.  Cardiovascular:     Rate and Rhythm: Normal rate and regular rhythm.     Pulses: Normal pulses.     Heart sounds: Normal heart sounds.  Pulmonary:     Effort: Pulmonary effort is normal. No respiratory distress.     Breath sounds: Normal breath sounds.  Abdominal:     General: Bowel sounds are normal. There is no distension.     Palpations: Abdomen is soft.     Tenderness: There is abdominal tenderness.  Musculoskeletal:        General: No swelling or deformity.  Skin:    General: Skin is warm and dry.  Neurological:     General: No focal deficit present.     Mental Status: Mental status is at baseline.    Labs on Admission: I have personally reviewed following labs and imaging studies  CBC: Recent Labs  Lab 01/26/24 1247  WBC 13.5*  HGB 14.3  HCT 42.4  MCV 85.8  PLT 242    Basic Metabolic Panel: Recent Labs  Lab 01/26/24 1247  NA 131*  K 3.9  CL 98  CO2 25  GLUCOSE 97  BUN 17  CREATININE 0.74   CALCIUM  8.6*    GFR: Estimated Creatinine Clearance: 70 mL/min (by C-G formula based on SCr of 0.74 mg/dL).  Liver Function Tests: Recent Labs  Lab 01/26/24 1247  AST 18  ALT 17  ALKPHOS 60  BILITOT 1.3*  PROT 6.3*  ALBUMIN 3.0*    Urine analysis: No results found for: "COLORURINE", "APPEARANCEUR", "LABSPEC", "PHURINE", "GLUCOSEU", "HGBUR", "BILIRUBINUR", "KETONESUR", "PROTEINUR", "UROBILINOGEN", "NITRITE", "LEUKOCYTESUR"  Radiological Exams on Admission: CT Angio Chest/Abd/Pel for Dissection W and/or Wo Contrast Result Date: 01/26/2024 CLINICAL DATA:  Sharp chest pain since this morning, abdominal pain, nausea and vomiting for 4 days, pain radiating from upper abdomen to back EXAM: CT ANGIOGRAPHY CHEST, ABDOMEN AND PELVIS TECHNIQUE: Non-contrast CT of the chest was initially obtained. Multidetector CT imaging through the chest, abdomen and pelvis was performed using the standard protocol during bolus administration of intravenous contrast. Multiplanar reconstructed images and MIPs were obtained and reviewed to evaluate the vascular anatomy. RADIATION DOSE REDUCTION: This exam was performed according to the departmental dose-optimization program which includes automated exposure control, adjustment of the mA and/or kV according to patient size and/or use of iterative reconstruction technique. CONTRAST:  75mL OMNIPAQUE  IOHEXOL  350 MG/ML SOLN COMPARISON:  01/26/2024 FINDINGS: CTA CHEST FINDINGS Cardiovascular: No evidence of thoracic aortic aneurysm or dissection. Great vessels are patent. Diffuse atherosclerosis of the thoracic aorta. Prominent left ventricular dilatation. No pericardial effusion. Dense atherosclerosis throughout the coronary vasculature. There is technically adequate opacification of the pulmonary vasculature. No filling defects or pulmonary emboli. Mediastinum/Nodes: No significant findings within the thyroid , trachea, or esophagus. No pathologic adenopathy. Lungs/Pleura:  Severe upper lobe predominant emphysema. No acute airspace disease, effusion, or pneumothorax. Mild bilateral bronchial wall thickening, greatest within the lower lobes, consistent with bronchitis or reactive airway disease. There is a 4 mm right upper lobe pulmonary nodule reference image 76/7, and a 3 mm right upper lobe pulmonary nodule reference image 70/7. Musculoskeletal: No acute or destructive bony abnormalities. Reconstructed images demonstrate no additional findings. Review of the MIP images confirms the above  findings. CTA ABDOMEN AND PELVIS FINDINGS VASCULAR Aorta: Normal caliber aorta without aneurysm, dissection, vasculitis or significant stenosis. Diffuse atherosclerosis. Celiac: Patent without evidence of aneurysm, dissection, vasculitis or significant stenosis. SMA: There is moderate atherosclerosis within the proximal SMA, with estimated 50% stenosis proximally 3.5 cm from the origin. No aneurysm, dissection, or vasculitis. Renals: Both renal arteries are patent without evidence of aneurysm, dissection, vasculitis, fibromuscular dysplasia or significant stenosis. Mild atherosclerosis. IMA: Patent without evidence of aneurysm, dissection, vasculitis or significant stenosis. Inflow: There is extensive atherosclerosis at the aortic bifurcation and bilateral common iliac arteries. On the left, there is estimated 70-90% stenosis at the origin of the left common iliac artery. Stenosis on the right estimated approaching 50%. 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 Diffuse atherosclerosis of the common femoral artery, with focal high-grade stenosis of the proximal left superficial femoral artery estimated 70-90%. There is extensive atherosclerosis throughout the right common iliac external iliac, and internal iliac arteries, with long segment stenosis of the external  iliac artery estimated 50-70%. Diffuse atherosclerosis of the right common femoral and superficial femoral arteries without focal high-grade stenosis. Veins: No obvious venous abnormality within the limitations of this arterial phase study. Review of the MIP images confirms the above findings. NON-VASCULAR Hepatobiliary: 1.5 cm right lobe liver cyst. Otherwise no focal liver abnormalities or biliary duct dilation. The gallbladder is decompressed, limiting its evaluation. Pancreas: Unremarkable. No pancreatic ductal dilatation or surrounding inflammatory changes. Spleen: Normal in size without focal abnormality. Adrenals/Urinary Tract: Left renal cortical cyst does not require specific imaging follow-up. Otherwise the kidneys enhance normally. No urinary tract calculi or obstructive uropathy. The adrenals and bladder appear unremarkable. Stomach/Bowel: There is segmental circumferential mural thickening within the mid sigmoid colon, extending approximately 5.7 cm in length reference image 269/6. There is an associated contained perforation measuring 6.1 x 4.0 x 6.0 cm, reference image 258/6. Overall, findings are consistent with perforated sigmoid diverticulitis with associated diverticular abscess. No bowel obstruction or ileus. Normal appendix right mid abdomen. Moderate retained stool throughout the colon. Lymphatic: No pathologic adenopathy within the abdomen or pelvis. Reproductive: Prostate is unremarkable. Other: No free fluid or free intraperitoneal gas. Small fat containing bilateral inguinal hernias. No bowel herniation. Musculoskeletal: No acute or destructive bony abnormalities. Prior L4-5 discectomy and posterior fusion. Reconstructed images demonstrate no additional findings. Review of the MIP images confirms the above findings. IMPRESSION: Vascular: 1. No evidence of thoracoabdominal aortic aneurysm or dissection. 2. No evidence of pulmonary embolus. 3. Aortic Atherosclerosis (ICD10-I70.0). Coronary  artery atherosclerosis. 4. 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 5. 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 6. Extensive atherosclerosis of the right iliac system, with stenosis estimated at 50-70% throughout the right external iliac artery. Nonvascular: 1. Segmental concentric mural thickening of the mid sigmoid colon, with associated contained perforation. Findings are compatible with perforated diverticulitis and diverticular abscess, though close follow-up is recommended to document resolution and exclude underlying neoplasm. 2. Bilateral bronchial wall thickening, greatest in the lower lobes, consistent with bronchitis or reactive airway disease. No acute airspace disease. 3.  Emphysema (ICD10-J43.9). 4. Moderate retained stool throughout the colon compatible with constipation. No obstruction or ileus. 5. Small bilateral fat containing inguinal hernias. 6. Multiple pulmonary nodules. Most  significant: Right solid pulmonary nodule within the upper lobe measuring 4 mm. Per Fleischner Society Guidelines,if patient is low risk for malignancy, no routine follow-up imaging is recommended. If patient is high risk for malignancy, a non-contrast Chest CT at 12 months is optional. If performed and the nodule is stable at 12 months, no further follow-up is recommended. These guidelines do not apply to immunocompromised patients and patients with cancer. Follow up in patients with significant comorbidities as clinically warranted. For lung cancer screening, adhere to Lung-RADS guidelines. Reference: Radiology. 2017; 284(1):228-43. Critical Value/emergent results were called by telephone at the time of interpretation on 01/26/2024 at 3:09 pm to provider [facility] , who verbally acknowledged these results. Electronically Signed   By: Bobbye Burrow M.D.   On: 01/26/2024 15:14   DG Chest 2 View Result Date: 01/26/2024 CLINICAL DATA:  Chest pain EXAM: CHEST - 2 VIEW COMPARISON:  Chest radiograph dated 07/13/2021 FINDINGS: Normal lung volumes. No focal consolidations. No pleural effusion or pneumothorax. The heart size and mediastinal contours are within normal limits. Cervical spinal fixation hardware appears intact. IMPRESSION: No active cardiopulmonary disease. Electronically Signed   By: Limin  Xu M.D.   On: 01/26/2024 13:58   EKG: Independently reviewed.  Sinus rhythm at 92 bpm.  PVCs noted.  Probable LVH with repolarization abnormality.  Otherwise nonspecific T wave changes.  Assessment/Plan Principal Problem:   Acute diverticulitis Active Problems:   CAD (coronary artery disease)   Mixed hyperlipidemia   HFrEF (heart failure with reduced ejection fraction) (HCC)   Status post coronary artery stent placement   Primary hypertension   Diverticulitis with contained perforation and abscess. > Patient presenting with 5 days of abdominal pain. > Found to have diverticulitis with diverticular abscess and contained perforation on CT. Leukocytosis 13.5. > General Surgery consulted and following.  Started on Zosyn.  General surgery considering IR consult for drainage and will follow. - Monitor on telemetry overnight - Appreciate general surgery recommendations and assistance - Continue with Zosyn - Trend fever curve and WBC - Follow-up blood cultures  PAD > Incidentally found to have severe iliac artery disease on the left and moderate to severe disease on the right.  On aspirin  and statin for his CAD. - Will need to follow-up with vascular surgery outpatient if not seen inpatient.  Likely will benefit from further antiplatelet therapy after he is out of the window for any surgical needs.  Hypertension - Continue home losartan   Hyperlipidemia - Continue home rosuvastatin   CAD > Status post stents - Continue home ASA,  rosuvastatin   Chronic systolic CHF > Last echo I can see was in 2023 with EF 25-30%, G1 DD, normal RV function - Not on a diuretic - Continue home losartan   DVT prophylaxis: SCDs for now Code Status:   Full Family Communication:  None on admission  Disposition Plan:   Patient is from:  Home  Anticipated DC to:  Home  Anticipated DC date:  To 4 days  Anticipated DC barriers: None  Consults called:  General surgery Admission status:  Inpatient, telemetry  Severity of Illness: The appropriate patient status for this patient is INPATIENT. Inpatient status is judged to be reasonable and necessary in order to provide the required intensity of service to ensure the patient's safety. The patient's presenting symptoms, physical exam findings, and initial radiographic and laboratory data in the context of their chronic comorbidities is felt to place them at high risk for further clinical deterioration. Furthermore, it is not anticipated  that the patient will be medically stable for discharge from the hospital within 2 midnights of admission.   * I certify that at the point of admission it is my clinical judgment that the patient will require inpatient hospital care spanning beyond 2 midnights from the point of admission due to high intensity of service, high risk for further deterioration and high frequency of surveillance required.Johnetta Nab MD [facility]  How to contact the [facility] Attending or Consulting provider 7A - 7P or covering provider during after hours 7P -7A, for this patient?   Check the care team in [facility] and look for a) attending/consulting [facility] provider listed and b) the [facility] team listed Log into [email or web address] and use [facility] universal password to access. If you do not have the password, please contact the hospital operator. Locate the [facility] provider you are looking for under [facility] and page to a number that you can be directly reached. If you still  have difficulty reaching the provider, please page the [facility] (Director on Call) for the Hospitalists listed on amion for assistance.  01/26/2024, 3:57 PM

## 2024-01-26 NOTE — Plan of Care (Signed)

## 2024-01-26 NOTE — ED Triage Notes (Addendum)
 Pt to ED via [facility] from home d/t chest pain. Pt has had 2 sharp chest pains this am. Pt has had abdominal pain, nausea, and vomiting x 4 days. Pain radiates from upper abdomen to back.   EMS VS 123/57 99 Cbg 108

## 2024-01-26 NOTE — Consult Note (Signed)
 [name] [name]. 1945-09-21  [id number].    Requesting MD: Ranelle Buys, MD Chief Complaint/Reason for Consult: complicated diverticulitis   HPI:  [name] [name] is a 78 y/o M with PMH NSTEMI, HFrEF, CAD, HLD, HTN, H/O polio, seasonal allergies, and sciatica who presents with back pain, stomach pain, and chest pain. Reports lower abdominal pain that started 6 days ago that has been severe and worsening. Today he developed chest pain and called EMS to make sure he was not having a heart attack. He has decreased PO intake due to concern it will worsen his pain though he is still hungry. He has been drinking coffee and eating a boiled egg a day. He has not been taking his medications except ASA due to episode of emesis on Saturday. He has had subjective fever. Last BM 3 days ago  He has significant cardiac history and last saw his cardiologist in 2023  He denies fever, chills, urinary symptoms, vomiting, changes in bowel habits, or blood in his stool. He denies use of blood thinners Abdominal surgical history significant for appendectomy He has had never a colonoscopy  ROS: as above Review of Systems  All other systems reviewed and are negative.   Family History  Problem Relation Age of Onset   Diabetes Mother    Heart disease Mother    Heart disease Father     Past Medical History:  Diagnosis Date   Coronary artery disease    Erectile dysfunction    HFrEF (heart failure with reduced ejection fraction) (HCC)    Echocardiogram 07/14/21:  EF 25, global HK, inf HK worse, Gr 2 DD, normal RVSF, mild to mod MR, AV sclerosis w/o AS Echocardiogram 2/23: EF 25-30, global HK, GLS -12.9, normal RVSF, mild to mod MR, AV sclerosis w/o AS    History of hiatal hernia    a lone time ago   Hyperlipidemia    [place] fracture [facility]) 03/22/2020   Myocardial infarction (HCC)    x 2   Polio     Past Surgical History:  Procedure Laterality Date   APPENDECTOMY     CARDIAC CATHETERIZATION     has  two stents placed   CERVICAL SPINE SURGERY     c3-4, 4-5,  5-6, 8 screws and plates   CORONARY STENT INTERVENTION N/A 07/15/2021   Procedure: CORONARY STENT INTERVENTION;  Surgeon: Avanell Leigh, MD;  Location: [facility];  Service: Cardiovascular;  Laterality: N/A;   CORONARY STENT PLACEMENT     forein body removal     metal sharp removed from right leg   LEFT HEART CATH AND CORONARY ANGIOGRAPHY N/A 11/15/2018   Procedure: LEFT HEART CATH AND CORONARY ANGIOGRAPHY;  Surgeon: Sammy Crisp, MD;  Location: [facility];  Service: Cardiovascular;  Laterality: N/A;   LEFT HEART CATH AND CORONARY ANGIOGRAPHY N/A 07/15/2021   Procedure: LEFT HEART CATH AND CORONARY ANGIOGRAPHY;  Surgeon: Avanell Leigh, MD;  Location: [facility];  Service: Cardiovascular;  Laterality: N/A;   MANDIBULAR HARDWARE REMOVAL Bilateral 04/23/2020   Procedure: MANDIBULAR HARDWARE REMOVAL;  Surgeon: Janita Mellow, MD;  Location: [facility];  Service: ENT;  Laterality: Bilateral;   ORIF MANDIBULAR FRACTURE N/A 03/14/2020   Procedure: OPEN REDUCTION INTERNAL FIXATION (ORIF) MID  FACE FRACTURE, MANDIBULAR FIXATION MODIFIED ARCH BARS;  Surgeon: Janita Mellow, MD;  Location: [facility] OR;  Service: ENT;  Laterality: N/A;   SCAR REVISION N/A 04/23/2020   Procedure: SCAR REVISION/  Upper Lip Repair;  Surgeon: Janita Mellow, MD;  Location: [facility];  Service: ENT;  Laterality: N/A;    Social History:  reports that he has quit smoking. His smoking use included cigarettes. He has been exposed to tobacco smoke. He has never used smokeless tobacco. He reports current alcohol use of about 21.0 standard drinks of alcohol per week. He reports that he does not use drugs.  Allergies:  Allergies  Allergen Reactions   Carvedilol      diarrhea   Lipitor [Atorvastatin] Other (See Comments)    "Makes pt feel funky"   Plavix  [Clopidogrel ] Rash    (Not in a hospital admission)    Physical  Exam: Blood pressure (!) 142/78, pulse 85, temperature 98.8 F (37.1 C), temperature source Oral, resp. rate 15, height 5' 6.5" (1.689 m), weight 67.1 kg, SpO2 100%. PE: General: pleasant, WD, male who is laying in bed in NAD HEENT: head is normocephalic, atraumatic.  Sclera are noninjected.  Pupils equal and round. EOMs intact.  Ears and nose without any masses or lesions.  Mouth is pink and moist Heart: regular, rate, and rhythm. Lungs: Respiratory effort nonlabored Abd: soft, NT, ND, no masses, hernias, or organomegaly. Focal TTP in suprapubic region with voluntary guarding.  MSK: all 4 extremities are symmetrical with no cyanosis, clubbing, or edema. Skin: warm and dry with no masses, lesions, or rashes Neuro: Cranial nerves 2-12 grossly intact, sensation is normal throughout Psych: A&Ox3 with an appropriate affect.    Results for orders placed or performed during the hospital encounter of 01/26/24 (from the past 48 hours)  CBC     Status: Abnormal   Collection Time: 01/26/24 12:47 PM  Result Value Ref Range   WBC 13.5 (H) 4.0 - 10.5 K/uL   RBC 4.94 4.22 - 5.81 MIL/uL   Hemoglobin 14.3 13.0 - 17.0 g/dL   HCT 16.1 09.6 - 04.5 %   MCV 85.8 80.0 - 100.0 fL   MCH 28.9 26.0 - 34.0 pg   MCHC 33.7 30.0 - 36.0 g/dL   RDW 40.9 81.1 - 91.4 %   Platelets 242 150 - 400 K/uL   nRBC 0.0 0.0 - 0.2 %    Comment: Performed at [facility], [address], [place], [place] [address]  Troponin I (High Sensitivity)     Status: None   Collection Time: 01/26/24 12:47 PM  Result Value Ref Range   Troponin I (High Sensitivity) 13 <18 ng/L    Comment: (NOTE) Elevated high sensitivity troponin I (hsTnI) values and significant  changes across serial measurements may suggest ACS but many other  chronic and acute conditions are known to elevate hsTnI results.  Refer to the "Links" section for chest pain algorithms and additional  guidance. Performed at [facility], [address],  [place], [place] [address]   Comprehensive metabolic panel     Status: Abnormal   Collection Time: 01/26/24 12:47 PM  Result Value Ref Range   Sodium 131 (L) 135 - 145 mmol/L   Potassium 3.9 3.5 - 5.1 mmol/L   Chloride 98 98 - 111 mmol/L   CO2 25 22 - 32 mmol/L   Glucose, Bld 97 70 - 99 mg/dL    Comment: Glucose reference range applies only to samples taken after fasting for at least 8 hours.   BUN 17 8 - 23 mg/dL   Creatinine, Ser 8.65 0.61 - 1.24 mg/dL   Calcium  8.6 (L) 8.9 - 10.3 mg/dL  Total Protein 6.3 (L) 6.5 - 8.1 g/dL   Albumin 3.0 (L) 3.5 - 5.0 g/dL   AST 18 15 - 41 U/L   ALT 17 0 - 44 U/L   Alkaline Phosphatase 60 38 - 126 U/L   Total Bilirubin 1.3 (H) 0.0 - 1.2 mg/dL   GFR, Estimated >16 >10 mL/min    Comment: (NOTE) Calculated using the CKD-EPI Creatinine Equation (2021)    Anion gap 8 5 - 15    Comment: Performed at [facility], [address], [place], [place] [address]  Lipase, blood     Status: None   Collection Time: 01/26/24 12:47 PM  Result Value Ref Range   Lipase 28 11 - 51 U/L    Comment: Performed at [facility], [address] [place], [place] [address]   CT Angio Chest/Abd/Pel for Dissection W and/or Wo Contrast Result Date: 01/26/2024 CLINICAL DATA:  Sharp chest pain since this morning, abdominal pain, nausea and vomiting for 4 days, pain radiating from upper abdomen to back EXAM: CT ANGIOGRAPHY CHEST, ABDOMEN AND PELVIS TECHNIQUE: Non-contrast CT of the chest was initially obtained. Multidetector CT imaging through the chest, abdomen and pelvis was performed using the standard protocol during bolus administration of intravenous contrast. Multiplanar reconstructed images and MIPs were obtained and reviewed to evaluate the vascular anatomy. RADIATION DOSE REDUCTION: This exam was performed according to the departmental dose-optimization program which includes automated exposure control, adjustment of the mA and/or kV according to patient size  and/or use of iterative reconstruction technique. CONTRAST:  75mL OMNIPAQUE  IOHEXOL  350 MG/ML SOLN COMPARISON:  01/26/2024 FINDINGS: CTA CHEST FINDINGS Cardiovascular: No evidence of thoracic aortic aneurysm or dissection. Great vessels are patent. Diffuse atherosclerosis of the thoracic aorta. Prominent left ventricular dilatation. No pericardial effusion. Dense atherosclerosis throughout the coronary vasculature. There is technically adequate opacification of the pulmonary vasculature. No filling defects or pulmonary emboli. Mediastinum/Nodes: No significant findings within the thyroid , trachea, or esophagus. No pathologic adenopathy. Lungs/Pleura: Severe upper lobe predominant emphysema. No acute airspace disease, effusion, or pneumothorax. Mild bilateral bronchial wall thickening, greatest within the lower lobes, consistent with bronchitis or reactive airway disease. There is a 4 mm right upper lobe pulmonary nodule reference image 76/7, and a 3 mm right upper lobe pulmonary nodule reference image 70/7. Musculoskeletal: No acute or destructive bony abnormalities. Reconstructed images demonstrate no additional findings. Review of the MIP images confirms the above findings. CTA ABDOMEN AND PELVIS FINDINGS VASCULAR Aorta: Normal caliber aorta without aneurysm, dissection, vasculitis or significant stenosis. Diffuse atherosclerosis. Celiac: Patent without evidence of aneurysm, dissection, vasculitis or significant stenosis. SMA: There is moderate atherosclerosis within the proximal SMA, with estimated 50% stenosis proximally 3.5 cm from the origin. No aneurysm, dissection, or vasculitis. Renals: Both renal arteries are patent without evidence of aneurysm, dissection, vasculitis, fibromuscular dysplasia or significant stenosis. Mild atherosclerosis. IMA: Patent without evidence of aneurysm, dissection, vasculitis or significant stenosis. Inflow: There is extensive atherosclerosis at the aortic bifurcation and  bilateral common iliac arteries. On the left, there is estimated 70-90% stenosis at the origin of the left common iliac artery. Stenosis on the right estimated approaching 50%. 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 Diffuse atherosclerosis of the common femoral artery, with focal high-grade stenosis of the proximal left superficial femoral artery estimated 70-90%. There is extensive atherosclerosis throughout the right common iliac external  iliac, and internal iliac arteries, with long segment stenosis of the external iliac artery estimated 50-70%. Diffuse atherosclerosis of the right common femoral and superficial femoral arteries without focal high-grade stenosis. Veins: No obvious venous abnormality within the limitations of this arterial phase study. Review of the MIP images confirms the above findings. NON-VASCULAR Hepatobiliary: 1.5 cm right lobe liver cyst. Otherwise no focal liver abnormalities or biliary duct dilation. The gallbladder is decompressed, limiting its evaluation. Pancreas: Unremarkable. No pancreatic ductal dilatation or surrounding inflammatory changes. Spleen: Normal in size without focal abnormality. Adrenals/Urinary Tract: Left renal cortical cyst does not require specific imaging follow-up. Otherwise the kidneys enhance normally. No urinary tract calculi or obstructive uropathy. The adrenals and bladder appear unremarkable. Stomach/Bowel: There is segmental circumferential mural thickening within the mid sigmoid colon, extending approximately 5.7 cm in length reference image 269/6. There is an associated contained perforation measuring 6.1 x 4.0 x 6.0 cm, reference image 258/6. Overall, findings are consistent with perforated sigmoid diverticulitis with associated diverticular abscess. No bowel obstruction or ileus. Normal appendix right mid abdomen.  Moderate retained stool throughout the colon. Lymphatic: No pathologic adenopathy within the abdomen or pelvis. Reproductive: Prostate is unremarkable. Other: No free fluid or free intraperitoneal gas. Small fat containing bilateral inguinal hernias. No bowel herniation. Musculoskeletal: No acute or destructive bony abnormalities. Prior L4-5 discectomy and posterior fusion. Reconstructed images demonstrate no additional findings. Review of the MIP images confirms the above findings. IMPRESSION: Vascular: 1. No evidence of thoracoabdominal aortic aneurysm or dissection. 2. No evidence of pulmonary embolus. 3. Aortic Atherosclerosis (ICD10-I70.0). Coronary artery atherosclerosis. 4. 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 5. 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 6. Extensive atherosclerosis of the right iliac system, with stenosis estimated at 50-70% throughout the right external iliac artery. Nonvascular: 1. Segmental concentric mural thickening of the mid sigmoid colon, with associated contained perforation. Findings are compatible with perforated diverticulitis and diverticular abscess, though close follow-up is recommended to document resolution and exclude underlying neoplasm. 2. Bilateral bronchial wall thickening, greatest in the lower lobes, consistent with bronchitis or reactive airway disease. No acute airspace disease. 3.  Emphysema (ICD10-J43.9). 4. Moderate retained stool throughout the colon compatible with constipation. No obstruction or ileus. 5. Small bilateral fat containing inguinal hernias. 6. Multiple pulmonary nodules. Most significant: Right solid pulmonary nodule within the upper lobe measuring 4 mm. Per Fleischner Society Guidelines,if patient is low risk for malignancy, no routine follow-up imaging is  recommended. If patient is high risk for malignancy, a non-contrast Chest CT at 12 months is optional. If performed and the nodule is stable at 12 months, no further follow-up is recommended. These guidelines do not apply to immunocompromised patients and patients with cancer. Follow up in patients with significant comorbidities as clinically warranted. For lung cancer screening, adhere to Lung-RADS guidelines. Reference: Radiology. 2017; 284(1):228-43. Critical Value/emergent results were called by telephone at the time of interpretation on 01/26/2024 at 3:09 pm to provider [facility] , who verbally acknowledged these results. Electronically Signed   By: Bobbye Burrow M.D.   On: 01/26/2024 15:14   DG Chest 2 View Result Date: 01/26/2024 CLINICAL DATA:  Chest pain EXAM: CHEST - 2 VIEW COMPARISON:  Chest radiograph dated 07/13/2021 FINDINGS: Normal lung volumes. No focal consolidations. No pleural effusion or pneumothorax. The heart size and mediastinal contours are within normal limits.  Cervical spinal fixation hardware appears intact. IMPRESSION: No active cardiopulmonary disease. Electronically Signed   By: Limin  Xu M.D.   On: 01/26/2024 13:58      Assessment/Plan Sigmoid diverticulitis with perforation and abscess  -  afebrile, VSS, WBC 13.5  -  CTA abdomen pelvis shows circumferential wall thickening of the mid sigmoid colon with contained perforation (6.1 x 4.0 x 6.0 cm).  - hemodynamically stable without peritonitis, no emergent role for surgery. Recommend attempted non-operative management with IV abx and IR consult for possible percutaneous drainage.  - failure to improve, or clinical deterioration, would warrant exploratory laparotomy, partial colectomy, colostomy. Given his cardiac history he would need cardiac evaluation for perioperative risk stratification and optimization.  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  Elwin Hammond, [facility] 01/26/2024, 4:22 PM Please see Amion for pager number during day hours 7:00am-4:30pm

## 2024-01-26 NOTE — Consult Note (Signed)
      Interventional radiology was consulted for possible image guided percutaneous diverticular abscess drain placement.  Imaging was reviewed by Dr. Darylene Epley 01/26/24.  There is no window for percutaneous drain placement.  Pt is not percutaneous drain candidate.    Please contact Dr. Darylene Epley with any further questions.    Electronically Signed: Pasty Bongo, PA-C 11/09/2023, 9:30 AM

## 2024-01-26 NOTE — ED Notes (Signed)
 Patient transported to X-ray

## 2024-01-26 NOTE — ED Provider Triage Note (Signed)
 Emergency Medicine Provider Triage Evaluation Note  [place] Bluemel. , a 78 y.o. male  was evaluated in triage.  Pt complains of abdominal pain with n/v. Describes low back pain. Endorseses SOB. Hx of MI.   Review of Systems  Positive:  Negative:   Physical Exam  BP 124/76   Pulse 96   Temp 98.8 F (37.1 C) (Oral)   Resp 18   Ht 5' 6.5" (1.689 m)   Wt 67.1 kg   SpO2 97%   BMI 23.53 kg/m  Gen:   Awake, no distress   Resp:  Normal effort  MSK:   Moves extremities without difficulty  Other:  Mild bilateral lower lumbar paraspinal tenderness  Medical Decision Making  Medically screening exam initiated at 12:55 PM.  Appropriate orders placed.  [name] was informed that the remainder of the evaluation will be completed by another provider, this initial triage assessment does not replace that evaluation, and the importance of remaining in the ED until their evaluation is complete.  Ct labs EKG room   Stanton Earthly 01/26/24 [id number]

## 2024-01-26 NOTE — ED Provider Notes (Signed)
 [place] EMERGENCY DEPARTMENT AT [facility] Provider Note   CSN: [id number] Arrival date & time: 01/26/24  1237     History  Chief Complaint  Patient presents with   Chest Pain    [name] [name]. is a 78 y.o. male with history of HFrEF, MI post PCI x 2, hyperlipidemia, hypertension presents with complaints of abdominal pain that radiates to the back with associated shortness of breath.  Symptoms started 4 days ago.  Is associated with nausea and emesis x 1.  No diarrhea.  No dysuria.  Has a history of appendectomy.   Chest Pain  Past Medical History:  Diagnosis Date   Coronary artery disease    Erectile dysfunction    HFrEF (heart failure with reduced ejection fraction) (HCC)    Echocardiogram 07/14/21:  EF 25, global HK, inf HK worse, Gr 2 DD, normal RVSF, mild to mod MR, AV sclerosis w/o AS Echocardiogram 2/23: EF 25-30, global HK, GLS -12.9, normal RVSF, mild to mod MR, AV sclerosis w/o AS    History of hiatal hernia    a lone time ago   Hyperlipidemia    [place] fracture [facility]) 03/22/2020   Myocardial infarction (HCC)    x 2   [place]    Past Surgical History:  Procedure Laterality Date   APPENDECTOMY     CARDIAC CATHETERIZATION     has two stents placed   CERVICAL SPINE SURGERY     c3-4, 4-5,  5-6, 8 screws and plates   CORONARY STENT INTERVENTION N/A 07/15/2021   Procedure: CORONARY STENT INTERVENTION;  Surgeon: Avanell Leigh, MD;  Location: [facility] INVASIVE CV LAB;  Service: Cardiovascular;  Laterality: N/A;   CORONARY STENT PLACEMENT     forein body removal     metal sharp removed from right leg   LEFT HEART CATH AND CORONARY ANGIOGRAPHY N/A 11/15/2018   Procedure: LEFT HEART CATH AND CORONARY ANGIOGRAPHY;  Surgeon: Sammy Crisp, MD;  Location: [facility] INVASIVE CV LAB;  Service: Cardiovascular;  Laterality: N/A;   LEFT HEART CATH AND CORONARY ANGIOGRAPHY N/A 07/15/2021   Procedure: LEFT HEART CATH AND CORONARY ANGIOGRAPHY;  Surgeon: Avanell Leigh,  MD;  Location: [facility] INVASIVE CV LAB;  Service: Cardiovascular;  Laterality: N/A;   MANDIBULAR HARDWARE REMOVAL Bilateral 04/23/2020   Procedure: MANDIBULAR HARDWARE REMOVAL;  Surgeon: Janita Mellow, MD;  Location: [facility];  Service: ENT;  Laterality: Bilateral;   ORIF MANDIBULAR FRACTURE N/A 03/14/2020   Procedure: OPEN REDUCTION INTERNAL FIXATION (ORIF) MID  FACE FRACTURE, MANDIBULAR FIXATION MODIFIED ARCH BARS;  Surgeon: Janita Mellow, MD;  Location: [facility] OR;  Service: ENT;  Laterality: N/A;   SCAR REVISION N/A 04/23/2020   Procedure: SCAR REVISION/ Upper Lip Repair;  Surgeon: Janita Mellow, MD;  Location:  SURGERY CENTER;  Service: ENT;  Laterality: N/A;       Home Medications Prior to Admission medications   Medication Sig Start Date End Date Taking? Authorizing Provider  albuterol  (VENTOLIN  HFA) 108 (90 Base) MCG/ACT inhaler Inhale 2 puffs into the lungs every 6 (six) hours as needed for wheezing (cough). 01/08/24  Yes Rakes, Georgeann Kindred, FNP  aspirin  81 MG chewable tablet Chew 81 mg by mouth daily.   Yes [provider]  cholecalciferol  (VITAMIN D3) 25 MCG (1000 UNIT) tablet Take 1,000 Units by mouth daily.    Yes [provider]  ketoconazole (NIZORAL) 2 % cream Apply 1 Application topically 2 (two) times daily. 12/07/23  Yes [provider]  losartan  (COZAAR ) 50 MG tablet Take 1 tablet (50 mg total) by mouth daily. 01/08/24  Yes Rakes, Georgeann Kindred, FNP  nitroGLYCERIN  (NITROSTAT ) 0.4 MG SL tablet Place 1 tablet (0.4 mg total) under the tongue every 5 (five) minutes as needed for chest pain. 10/15/21  Yes Hershel Los F, FNP  rosuvastatin  (CRESTOR ) 20 MG tablet Take 1 tablet (20 mg total) by mouth daily. 01/08/24  Yes Rakes, Georgeann Kindred, FNP  augmented betamethasone dipropionate (DIPROLENE-AF) 0.05 % cream Apply 1 Application topically. Patient not taking: Reported on 01/26/2024 12/07/23   [provider]  fluticasone  (FLONASE ) 50 MCG/ACT nasal spray  Place 2 sprays into both nostrils daily. Patient not taking: Reported on 01/26/2024 01/08/24   Galvin Jules, FNP      Allergies    Carvedilol , Lipitor [atorvastatin], and Plavix  [clopidogrel ]    Review of Systems   Review of Systems  Cardiovascular:  Positive for chest pain.    Physical Exam Updated Vital Signs BP (!) 142/78   Pulse 85   Temp 98.8 F (37.1 C) (Oral)   Resp 15   Ht 5' 6.5" (1.689 m)   Wt 67.1 kg   SpO2 100%   BMI 23.53 kg/m  Physical Exam Vitals and nursing note reviewed.  Constitutional:      General: He is not in acute distress.    Appearance: He is well-developed.  HENT:     Head: Normocephalic and atraumatic.  Eyes:     Conjunctiva/sclera: Conjunctivae normal.  Cardiovascular:     Rate and Rhythm: Normal rate and regular rhythm.     Heart sounds: No murmur heard. Pulmonary:     Effort: Pulmonary effort is normal. No respiratory distress.     Breath sounds: Normal breath sounds.  Abdominal:     Palpations: Abdomen is soft.     Tenderness: There is abdominal tenderness.     Comments: Lower abdominal tenderness, worsening left, no peritoneal signs  Musculoskeletal:        General: No swelling.     Cervical back: Neck supple.     Comments: Mild bilateral lower lumbar paraspinal tenderness  Skin:    General: Skin is warm and dry.     Capillary Refill: Capillary refill takes less than 2 seconds.  Neurological:     Mental Status: He is alert.  Psychiatric:        Mood and Affect: Mood normal.     ED Results / Procedures / Treatments   Labs (all labs ordered are listed, but only abnormal results are displayed) Labs Reviewed  CBC - Abnormal; Notable for the following components:      Result Value   WBC 13.5 (*)    All other components within normal limits  COMPREHENSIVE METABOLIC PANEL WITH GFR - Abnormal; Notable for the following components:   Sodium 131 (*)    Calcium  8.6 (*)    Total Protein 6.3 (*)    Albumin 3.0 (*)    Total  Bilirubin 1.3 (*)    All other components within normal limits  URINALYSIS, ROUTINE W REFLEX MICROSCOPIC - Abnormal; Notable for the following components:   Specific Gravity, Urine >1.046 (*)    Ketones, ur 20 (*)    All other components within normal limits  CULTURE, BLOOD (ROUTINE X 2)  CULTURE, BLOOD (ROUTINE X 2)  LIPASE, BLOOD  I-STAT CG4 LACTIC ACID, ED  TROPONIN I (HIGH SENSITIVITY)  TROPONIN I (HIGH SENSITIVITY)    EKG EKG Interpretation Date/Time:  Tuesday Jan 26 2024 12:42:26 EDT Ventricular Rate:  92 PR Interval:  140 QRS Duration:  120 QT Interval:  376 QTC Calculation: 464 R Axis:   63  Text Interpretation: Sinus rhythm with occasional Premature ventricular complexes Minimal voltage criteria for LVH, may be normal variant ( Cornell product ) Anterior infarct , age undetermined Abnormal ECG When compared with ECG of 16-Jul-2021 07:15, PREVIOUS ECG IS PRESENT Confirmed by Rafael Bun [phone]) on 01/26/2024 2:49:47 PM  Radiology CT Angio Chest/Abd/Pel for Dissection W and/or Wo Contrast Result Date: 01/26/2024 CLINICAL DATA:  Sharp chest pain since this morning, abdominal pain, nausea and vomiting for 4 days, pain radiating from upper abdomen to back EXAM: CT ANGIOGRAPHY CHEST, ABDOMEN AND PELVIS TECHNIQUE: Non-contrast CT of the chest was initially obtained. Multidetector CT imaging through the chest, abdomen and pelvis was performed using the standard protocol during bolus administration of intravenous contrast. Multiplanar reconstructed images and MIPs were obtained and reviewed to evaluate the vascular anatomy. RADIATION DOSE REDUCTION: This exam was performed according to the departmental dose-optimization program which includes automated exposure control, adjustment of the mA and/or kV according to patient size and/or use of iterative reconstruction technique. CONTRAST:  75mL OMNIPAQUE  IOHEXOL  350 MG/ML SOLN COMPARISON:  01/26/2024 FINDINGS: CTA CHEST FINDINGS  Cardiovascular: No evidence of thoracic aortic aneurysm or dissection. Great vessels are patent. Diffuse atherosclerosis of the thoracic aorta. Prominent left ventricular dilatation. No pericardial effusion. Dense atherosclerosis throughout the coronary vasculature. There is technically adequate opacification of the pulmonary vasculature. No filling defects or pulmonary emboli. Mediastinum/Nodes: No significant findings within the thyroid , trachea, or esophagus. No pathologic adenopathy. Lungs/Pleura: Severe upper lobe predominant emphysema. No acute airspace disease, effusion, or pneumothorax. Mild bilateral bronchial wall thickening, greatest within the lower lobes, consistent with bronchitis or reactive airway disease. There is a 4 mm right upper lobe pulmonary nodule reference image 76/7, and a 3 mm right upper lobe pulmonary nodule reference image 70/7. Musculoskeletal: No acute or destructive bony abnormalities. Reconstructed images demonstrate no additional findings. Review of the MIP images confirms the above findings. CTA ABDOMEN AND PELVIS FINDINGS VASCULAR Aorta: Normal caliber aorta without aneurysm, dissection, vasculitis or significant stenosis. Diffuse atherosclerosis. Celiac: Patent without evidence of aneurysm, dissection, vasculitis or significant stenosis. SMA: There is moderate atherosclerosis within the proximal SMA, with estimated 50% stenosis proximally 3.5 cm from the origin. No aneurysm, dissection, or vasculitis. Renals: Both renal arteries are patent without evidence of aneurysm, dissection, vasculitis, fibromuscular dysplasia or significant stenosis. Mild atherosclerosis. IMA: Patent without evidence of aneurysm, dissection, vasculitis or significant stenosis. Inflow: There is extensive atherosclerosis at the aortic bifurcation and bilateral common iliac arteries. On the left, there is estimated 70-90% stenosis at the origin of the left common iliac artery. Stenosis on the right estimated  approaching 50%. 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 Diffuse atherosclerosis of the common femoral artery, with focal high-grade stenosis of the proximal left superficial femoral artery estimated 70-90%. There is extensive atherosclerosis throughout the right common iliac external iliac, and internal iliac arteries, with long segment stenosis of the external iliac artery estimated 50-70%. Diffuse atherosclerosis of the right common femoral and superficial femoral arteries without focal high-grade stenosis. Veins: No obvious venous abnormality within the limitations of this arterial phase study. Review of the MIP images confirms the above findings. NON-VASCULAR Hepatobiliary: 1.5 cm right lobe liver cyst. Otherwise no  focal liver abnormalities or biliary duct dilation. The gallbladder is decompressed, limiting its evaluation. Pancreas: Unremarkable. No pancreatic ductal dilatation or surrounding inflammatory changes. Spleen: Normal in size without focal abnormality. Adrenals/Urinary Tract: Left renal cortical cyst does not require specific imaging follow-up. Otherwise the kidneys enhance normally. No urinary tract calculi or obstructive uropathy. The adrenals and bladder appear unremarkable. Stomach/Bowel: There is segmental circumferential mural thickening within the mid sigmoid colon, extending approximately 5.7 cm in length reference image 269/6. There is an associated contained perforation measuring 6.1 x 4.0 x 6.0 cm, reference image 258/6. Overall, findings are consistent with perforated sigmoid diverticulitis with associated diverticular abscess. No bowel obstruction or ileus. Normal appendix right mid abdomen. Moderate retained stool throughout the colon. Lymphatic: No pathologic adenopathy within the abdomen or pelvis. Reproductive: Prostate is unremarkable. Other:  No free fluid or free intraperitoneal gas. Small fat containing bilateral inguinal hernias. No bowel herniation. Musculoskeletal: No acute or destructive bony abnormalities. Prior L4-5 discectomy and posterior fusion. Reconstructed images demonstrate no additional findings. Review of the MIP images confirms the above findings. IMPRESSION: Vascular: 1. No evidence of thoracoabdominal aortic aneurysm or dissection. 2. No evidence of pulmonary embolus. 3. Aortic Atherosclerosis (ICD10-I70.0). Coronary artery atherosclerosis. 4. 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 5. 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 6. Extensive atherosclerosis of the right iliac system, with stenosis estimated at 50-70% throughout the right external iliac artery. Nonvascular: 1. Segmental concentric mural thickening of the mid sigmoid colon, with associated contained perforation. Findings are compatible with perforated diverticulitis and diverticular abscess, though close follow-up is recommended to document resolution and exclude underlying neoplasm. 2. Bilateral bronchial wall thickening, greatest in the lower lobes, consistent with bronchitis or reactive airway disease. No acute airspace disease. 3.  Emphysema (ICD10-J43.9). 4. Moderate retained stool throughout the colon compatible with constipation. No obstruction or ileus. 5. Small bilateral fat containing inguinal hernias. 6. Multiple pulmonary nodules. Most significant: Right solid pulmonary nodule within the upper lobe measuring 4 mm. Per Fleischner Society Guidelines,if patient is low risk for malignancy, no routine follow-up imaging is recommended. If patient is high risk for malignancy, a non-contrast Chest CT at 12 months is optional. If performed and the nodule is stable at 12 months, no further  follow-up is recommended. These guidelines do not apply to immunocompromised patients and patients with cancer. Follow up in patients with significant comorbidities as clinically warranted. For lung cancer screening, adhere to Lung-RADS guidelines. Reference: Radiology. 2017; 284(1):228-43. Critical Value/emergent results were called by telephone at the time of interpretation on 01/26/2024 at 3:09 pm to provider [facility] , who verbally acknowledged these results. Electronically Signed   By: Bobbye Burrow M.D.   On: 01/26/2024 15:14   DG Chest 2 View Result Date: 01/26/2024 CLINICAL DATA:  Chest pain EXAM: CHEST - 2 VIEW COMPARISON:  Chest radiograph dated 07/13/2021 FINDINGS: Normal lung volumes. No focal consolidations. No pleural effusion or pneumothorax. The heart size and mediastinal contours are within normal limits. Cervical spinal fixation hardware appears intact. IMPRESSION: No active cardiopulmonary disease. Electronically Signed   By: Limin  Xu M.D.   On: 01/26/2024 13:58    Procedures Procedures    Medications Ordered in ED Medications  piperacillin-tazobactam (ZOSYN) IVPB 3.375 g (0 g Intravenous Stopped 01/26/24 1621)    Followed by  piperacillin-tazobactam (ZOSYN) IVPB 3.375 g (has no  administration in time range)  sodium chloride  flush (NS) 0.9 % injection 3 mL (has no administration in time range)  0.9 %  sodium chloride  infusion (has no administration in time range)  acetaminophen  (TYLENOL ) tablet 650 mg (has no administration in time range)    Or  acetaminophen  (TYLENOL ) suppository 650 mg (has no administration in time range)  HYDROmorphone  (DILAUDID ) injection 0.5 mg (has no administration in time range)  HYDROcodone -acetaminophen  (NORCO/VICODIN) 5-325 MG per tablet 1 tablet (has no administration in time range)  polyethylene glycol (MIRALAX  / GLYCOLAX ) packet 17 g (has no administration in time range)  albuterol  (VENTOLIN  HFA) 108 (90 Base) MCG/ACT inhaler 2 puff (has  no administration in time range)  losartan  (COZAAR ) tablet 50 mg (has no administration in time range)  rosuvastatin  (CRESTOR ) tablet 20 mg (has no administration in time range)  aspirin  chewable tablet 81 mg (has no administration in time range)  iohexol  (OMNIPAQUE ) 350 MG/ML injection 75 mL (75 mLs Intravenous Contrast Given 01/26/24 1419)    ED Course/ Medical Decision Making/ A&P Clinical Course as of 01/26/24 1643  Tue Jan 26, 2024  1510 Call taken from radiology who has reviewed CTA chest abdomen pelvis.  No evidence of dissection.  No PE.  There is however concern for contained perforated abscess of sigmoid colon. [MP]  1520 Discussed with surgery PA Cary Clarks who will evaluate patient in the ED and review CT.  She recommends admission to hospital service for IV antibiotics.  Surgery team will follow [MP]    Clinical Course User Index [MP] Sallyanne Creamer, DO                                 Medical Decision Making Amount and/or Complexity of Data Reviewed Labs: ordered. Radiology: ordered.  Risk Prescription drug management.   This patient presents to the ED with chief complaint(s) of chest and abdomen pain.  The complaint involves an extensive differential diagnosis and also carries with it a high risk of complications and morbidity.   Pertinent past medical history as listed in HPI  The differential diagnosis includes  Aortic dissection, ACS, PE, cholecystitis, diverticulitis, pancreatitis, gastroenteritis Additional history obtained: Records reviewed Care Everywhere/External Records  Assessment and management:   Hemodynamically stable, nontoxic-appearing patient presenting with complaints of abdominal pain that radiates to the back associated with shortness of breath.  Pain has been ongoing for the past 4 days associate with nausea and emesis x 1, no diarrhea.  No dysuria.  Patient has a significant cardiac history, with other risk factors including hypertension and smoking  history.  Although he appears relatively comfortable and not particularly hypertensive.  Dissection study obtained which did not show any evidence of dissection or PE, however there was evidence of of central mural thickening of the mid sigmoid colon with associated contained perforation.  Compatible with perforated diverticulum enteritis and abscess.  General surgery consulted recommended empiric antibiotics and hospital admission.  Independent ECG interpretation:  Sinus rhythm with occasional PVC  Independent labs interpretation:  The following labs were independently interpreted:  UA with elevated specific gravity, negative nitrates, negative leukocytes, CMP with mild hyponatremia of 131, CBC with leukocytosis to 13.5  Independent visualization and interpretation of imaging: I independently visualized the following imaging with scope of interpretation limited to determining acute life threatening conditions related to emergency care:  CT with no evidence of dissection, does demonstrate diverticulitis with perforation Chest x-ray with no  cardiopulmonary disease  Consultations obtained:   General surgery PA Cary Clarks agreed to consult, recommended starting empiric antibiotics and admitted to medicine Hospitalist agreed for admission  Disposition:   Patient be admitted for further workup and management.  Social Determinants of Health:   None  This note was dictated with voice recognition software.  Despite best efforts at proofreading, errors may have occurred which can change the documentation meaning.          Final Clinical Impression(s) / ED Diagnoses Final diagnoses:  Diverticulitis    Rx / DC Orders ED Discharge Orders     None         Felicie Horning, PA-C 01/26/24 1643    Rafael Bun A, DO 01/27/24 1503

## 2024-01-27 DIAGNOSIS — K5792 Diverticulitis of intestine, part unspecified, without perforation or abscess without bleeding: Secondary | ICD-10-CM | POA: Diagnosis not present

## 2024-01-27 DIAGNOSIS — Z01810 Encounter for preprocedural cardiovascular examination: Secondary | ICD-10-CM

## 2024-01-27 LAB — CBC
HCT: 41.2 % (ref 39.0–52.0)
Hemoglobin: 13.7 g/dL (ref 13.0–17.0)
MCH: 28.5 pg (ref 26.0–34.0)
MCHC: 33.3 g/dL (ref 30.0–36.0)
MCV: 85.7 fL (ref 80.0–100.0)
Platelets: 275 10*3/uL (ref 150–400)
RBC: 4.81 MIL/uL (ref 4.22–5.81)
RDW: 13 % (ref 11.5–15.5)
WBC: 14.9 10*3/uL — ABNORMAL HIGH (ref 4.0–10.5)
nRBC: 0 % (ref 0.0–0.2)

## 2024-01-27 LAB — COMPREHENSIVE METABOLIC PANEL WITH GFR
ALT: 15 U/L (ref 0–44)
AST: 19 U/L (ref 15–41)
Albumin: 2.8 g/dL — ABNORMAL LOW (ref 3.5–5.0)
Alkaline Phosphatase: 56 U/L (ref 38–126)
Anion gap: 12 (ref 5–15)
BUN: 17 mg/dL (ref 8–23)
CO2: 18 mmol/L — ABNORMAL LOW (ref 22–32)
Calcium: 8.5 mg/dL — ABNORMAL LOW (ref 8.9–10.3)
Chloride: 100 mmol/L (ref 98–111)
Creatinine, Ser: 0.76 mg/dL (ref 0.61–1.24)
GFR, Estimated: >60 mL/min (ref >60)
Glucose, Bld: 71 mg/dL (ref 70–99)
Potassium: 4 mmol/L (ref 3.5–5.1)
Sodium: 130 mmol/L — ABNORMAL LOW (ref 135–145)
Total Bilirubin: 1.6 mg/dL — ABNORMAL HIGH (ref 0.0–1.2)
Total Protein: 6.9 g/dL (ref 6.5–8.1)

## 2024-01-27 NOTE — Consult Note (Addendum)
 Cardiology Consultation   Patient ID: Emitt Burget. MRN: [id number]; DOB: 1945-10-30  Admit date: 01/26/2024 Date of Consult: 01/27/2024  PCP:  Galvin Jules, FNP   [facility] Providers Cardiologist:  Ahmad Alert, MD      Patient Profile:   Arnoldo Salzman. is a 78 y.o. male with a hx of CAD s/p DES to RCA '02, DES to LAD, and overlapping DES to RCA '22, HFrEF/ICM, HTN, HLD who is being seen 01/27/2024 for the evaluation of preop evaluation at the request of Dr. Alla Isaacs.  History of Present Illness:      Mr. Mckittrick is a 78 yo male with PMH noted above. He has been followed by Dr. Alroy Aspen as an outpatient. History of remote stenting of the dRCA and mLAD. Echo 11/2018 with LVEF of 45-50%, inferolateral akinesis. Presented with NSTEMI 06/2021 and underwent cardiac cath with DES x2 to RCA. LVEF noted at 25%. Seen in 10/2021 and reported having stopped most of his medications 2/2 side effects (Brilinta  >> dyspnea, Plavix  + ASA >> rectal bleeding, Plavix  >> rash, Coreg  >> diarrhea, Bisoprolol  >> bleeding, Entresto  >> bleeding). Echo at that time showed LVEF of 25-30%, mild to moderate MR, AV sclerosis but no aortic stenosis. Repeat echo 01/2022 with LVEF of 25-30%, global hypokinesis, mild concentric LVH, g1DD. Seen in the clinic 04/2022 with Slater Duncan with dizziness. Last in the office on  07/2022 and overall reported doing well without chest pain. He was unable to complete his year of DAPT and had only been on ASA and Crestor . He was continued on losartan  50mg  daily, restarted on Toprol  XL 12.5mg  daily and referred to EP for consideration of ICD.    Presented to the ED on 5/13 with complaints of abd pain, nausea and vomiting for several days. States he may have had a fever at one point. Had two twinges of sharp chest discomfort., but mainly called EMS b/c of his abd pain.  In the ED labs showed Na + 131, K+ 3.9, Cr 0.74, hsTn 13>>13, WBC 13.5, Hgb 14.3. CXR negative.  EKG showed sinus rhythm, 92 bpm, LVH, PVC. CT abd/pelvis with segmental concentric mural thickening of mid sigmoid colon with contained colonic wall perforation and diverticular abscess. Also noted to have 50% stenosis of the SMA, high-grade stenosis at the left common iliac and left common femoral artery of 70-90% as well as a left complete occlusion of the left external iliac artery with distal reconstitution. Further iliac atherosclerosis noted on the right with stenosis in the 50-70% range. General surgery consulted with initial recommendations for perc drain, but not felt to be a candidate. Currently being managed conservatively but cardiology asked to evaluate in the event surgery is warranted.   In talking with patient, he has been doing well from a cardiac standpoint. He hunts and fishes on a regular basis without chest pain or dyspnea. 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 Has had no issues with HF/volume overload and takes his medications.   Past Medical History:  Diagnosis Date   Coronary artery disease    Erectile dysfunction    HFrEF (heart failure with reduced ejection fraction) (HCC)    Echocardiogram 07/14/21:  EF 25, global HK, inf HK worse, Gr 2 DD, normal RVSF, mild to mod MR, AV sclerosis w/o AS Echocardiogram 2/23: EF 25-30, global HK, GLS -12.9, normal RVSF, mild to mod MR, AV sclerosis w/o AS    History of hiatal hernia  a lone time ago   Hyperlipidemia    Madelin Schatz fracture [facility]) 03/22/2020   Myocardial infarction (HCC)    x 2   Polio     Past Surgical History:  Procedure Laterality Date   APPENDECTOMY     CARDIAC CATHETERIZATION     has two stents placed   CERVICAL SPINE SURGERY     c3-4, 4-5,  5-6, 8 screws and plates   CORONARY STENT INTERVENTION N/A 07/15/2021   Procedure: CORONARY STENT INTERVENTION;  Surgeon: Avanell Leigh, MD;  Location: [facility];  Service: Cardiovascular;  Laterality: N/A;   CORONARY STENT PLACEMENT     forein  body removal     metal sharp removed from right leg   LEFT HEART CATH AND CORONARY ANGIOGRAPHY N/A 11/15/2018   Procedure: LEFT HEART CATH AND CORONARY ANGIOGRAPHY;  Surgeon: Sammy Crisp, MD;  Location: [facility];  Service: Cardiovascular;  Laterality: N/A;   LEFT HEART CATH AND CORONARY ANGIOGRAPHY N/A 07/15/2021   Procedure: LEFT HEART CATH AND CORONARY ANGIOGRAPHY;  Surgeon: Avanell Leigh, MD;  Location: [facility];  Service: Cardiovascular;  Laterality: N/A;   MANDIBULAR HARDWARE REMOVAL Bilateral 04/23/2020   Procedure: MANDIBULAR HARDWARE REMOVAL;  Surgeon: Janita Mellow, MD;  Location: [facility];  Service: ENT;  Laterality: Bilateral;   ORIF MANDIBULAR FRACTURE N/A 03/14/2020   Procedure: OPEN REDUCTION INTERNAL FIXATION (ORIF) MID  FACE FRACTURE, MANDIBULAR FIXATION MODIFIED ARCH BARS;  Surgeon: Janita Mellow, MD;  Location: [facility];  Service: ENT;  Laterality: N/A;   SCAR REVISION N/A 04/23/2020   Procedure: SCAR REVISION/ Upper Lip Repair;  Surgeon: Janita Mellow, MD;  Location: [facility];  Service: ENT;  Laterality: N/A;     Inpatient Medications: Scheduled Meds:  aspirin   81 mg Oral Daily   losartan   50 mg Oral Daily   rosuvastatin   20 mg Oral Daily   sodium chloride  flush  3 mL Intravenous Q12H   Continuous Infusions:  piperacillin-tazobactam (ZOSYN)  IV 3.375 g (01/27/24 0411)   PRN Meds: acetaminophen  **OR** acetaminophen , albuterol , HYDROcodone -acetaminophen , HYDROmorphone  (DILAUDID ) injection, polyethylene glycol  Allergies:    Allergies  Allergen Reactions   Carvedilol  Diarrhea   Lipitor [Atorvastatin] Other (See Comments)    "Makes pt feel funky"   Plavix  [Clopidogrel ] Rash    Social History:   Social History   Socioeconomic History   Marital status: Divorced    Spouse name: Not on file   Number of children: 2   Years of education: 12   Highest education level: 12th grade  Occupational History   Occupation:  retired    Comment: farmer  Tobacco Use   Smoking status: Former    Types: Cigarettes    Passive exposure: Past   Smokeless tobacco: Never  Vaping Use   Vaping status: Never Used  Substance and Sexual Activity   Alcohol use: Yes    Alcohol/week: 21.0 standard drinks of alcohol    Types: 21 Cans of beer per week    Comment: 2 beers daily   Drug use: No   Sexual activity: Not on file  Other Topics Concern   Not on file  Social History Narrative   Mr. Ilgenfritz has a restraining order against one of his sons.   Social Drivers of Corporate investment banker Strain: Low Risk  (02/13/2023)   Overall Financial Resource Strain (CARDIA)    Difficulty of Paying Living Expenses: Not hard at all  Food Insecurity: No Food Insecurity (01/26/2024)   Hunger Vital Sign    Worried About Running Out of Food in the Last Year: Never true    Ran Out of Food in the Last Year: Never true  Transportation Needs: No Transportation Needs (01/26/2024)   PRAPARE - Administrator, Civil Service (Medical): No    Lack of Transportation (Non-Medical): No  Physical Activity: Insufficiently Active (02/13/2023)   Exercise Vital Sign    Days of Exercise per Week: 3 days    Minutes of Exercise per Session: 30 min  Stress: No Stress Concern Present (10/22/2022)   [employer] of Occupational Health - Occupational Stress Questionnaire    Feeling of Stress : Only a little  Social Connections: Socially Integrated (01/27/2024)   Social Connection and Isolation Panel [NHANES]    Frequency of Communication with Friends and Family: More than three times a week    Frequency of Social Gatherings with Friends and Family: More than three times a week    Attends Religious Services: More than 4 times per year    Active Member of [employer] or Organizations: Yes    Attends Engineer, structural: More than 4 times per year    Marital Status: Married  Catering manager Violence: Not At Risk (01/26/2024)    Humiliation, Afraid, Rape, and Kick questionnaire    Fear of Current or Ex-Partner: No    Emotionally Abused: No    Physically Abused: No    Sexually Abused: No    Family History:    Family History  Problem Relation Age of Onset   Diabetes Mother    Heart disease Mother    Heart disease Father      ROS:  Please see the history of present illness.   All other ROS reviewed and negative.     Physical Exam/Data:   Vitals:   01/26/24 1651 01/26/24 1936 01/27/24 0448 01/27/24 0900  BP: 116/74 (!) 143/76 90/68 127/67  Pulse: 86 83 96 96  Resp: (!) 30 20 17 16   Temp: 98.1 F (36.7 C) 97.7 F (36.5 C) 97.9 F (36.6 C) 97.9 F (36.6 C)  TempSrc: Oral   Oral  SpO2: 98% 95% 94% 99%  Weight:      Height:       No intake or output data in the 24 hours ending 01/27/24 1318    01/26/2024   12:45 PM 01/08/2024    9:52 AM 09/17/2023    1:18 PM  Last 3 Weights  Weight (lbs) 148 lb 138 lb 12.8 oz 142 lb  Weight (kg) 67.132 kg 62.959 kg 64.411 kg     Body mass index is 23.53 kg/m.  General:  Well nourished, well developed, in no acute distress HEENT: normal Neck: no JVD Vascular: No carotid bruits; Distal pulses 2+ bilaterally Cardiac:  normal S1, S2; RRR; no murmur  Lungs:  clear to auscultation bilaterally, no wheezing, rhonchi or rales  Abd: soft, nontender, no hepatomegaly  Ext: no edema Musculoskeletal:  No deformities, BUE and BLE strength normal and equal Skin: warm and dry  Neuro:  CNs 2-12 intact, no focal abnormalities noted Psych:  Normal affect   EKG:  The EKG was personally reviewed and demonstrates:  sinus rhythm, 92 bpm, LVH, PVC.  Telemetry:  Telemetry was personally reviewed and demonstrates:  sinus rhythm, PVCs  Relevant CV Studies:  Echo: 01/2022  IMPRESSIONS     1. Left ventricular ejection fraction, by estimation, is 25 to 30%.  The  left ventricle has severely decreased function. The left ventricle  demonstrates global hypokinesis. There is mild  concentric left ventricular  hypertrophy. Left ventricular diastolic   parameters are consistent with Grade I diastolic dysfunction (impaired  relaxation). The average left ventricular global longitudinal strain is  -14.5 %. The global longitudinal strain is abnormal.   2. Right ventricular systolic function is normal. The right ventricular  size is normal.   3. The mitral valve is normal in structure. No evidence of mitral valve  regurgitation. No evidence of mitral stenosis.   4. The aortic valve is tricuspid. Aortic valve regurgitation is not  visualized. No aortic stenosis is present.   5. The inferior vena cava is normal in size with <50% respiratory  variability, suggesting right atrial pressure of 8 mmHg.   FINDINGS   Left Ventricle: Left ventricular ejection fraction, by estimation, is 25  to 30%. The left ventricle has severely decreased function. The left  ventricle demonstrates global hypokinesis. The average left ventricular  global longitudinal strain is -14.5 %.  The global longitudinal strain is abnormal. The left ventricular internal  cavity size was normal in size. There is mild concentric left ventricular  hypertrophy. Left ventricular diastolic parameters are consistent with  Grade I diastolic dysfunction  (impaired relaxation). Indeterminate filling pressures.   Right Ventricle: The right ventricular size is normal. No increase in  right ventricular wall thickness. Right ventricular systolic function is  normal.   Left Atrium: Left atrial size was normal in size.   Right Atrium: Right atrial size was normal in size.   Pericardium: There is no evidence of pericardial effusion.   Mitral Valve: The mitral valve is normal in structure. No evidence of  mitral valve regurgitation. No evidence of mitral valve stenosis.   Tricuspid Valve: The tricuspid valve is normal in structure. Tricuspid  valve regurgitation is not demonstrated. No evidence of tricuspid   stenosis.   Aortic Valve: The aortic valve is tricuspid. Aortic valve regurgitation is  not visualized. No aortic stenosis is present.   Pulmonic Valve: The pulmonic valve was normal in structure. Pulmonic valve  regurgitation is not visualized. No evidence of pulmonic stenosis.   Aorta: The aortic root is normal in size and structure.   Venous: The inferior vena cava is normal in size with less than 50%  respiratory variability, suggesting right atrial pressure of 8 mmHg.   IAS/Shunts: No atrial level shunt detected by color flow Doppler.   Cath: 06/2021  MPRESSION: Mr. Whittum has unchanged anatomy in his left system.  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  The PLA which Dr. Nolan Battle attempted to cross 2 years ago was patent with TIMI I-II flow and a 99% stenosis which is similar to how he left it.  I was able to stent the De novo lesion and stent the previously placed RCA stent for high-grade "ISR" with 2 overlapping drug-eluting stents post dilating the entire segment to 2.83 mm.  This was facilitated with an AL 0.75 guide, Cutting Balloon atherectomy and a guide liner placed in the distal RCA.  Patient tolerated procedure well.  He will need uninterrupted dual antiplatelet therapy for at least 12 months if not indefinitely.  It is unclear whether his LV dysfunction is related to the RCA or true, true and unrelated but nevertheless he will need guideline directed optimal medical therapy for his EF of 25%.  I have communicated the results to Dr. Felipe Horton, his attending cardiologist.  He left the lab in stable condition.   Lauro Portal. MD, [facility] 07/15/2021 12:12 PM  Diagnostic Dominance: Right  Intervention    Laboratory Data:  High Sensitivity Troponin:   Recent Labs  Lab 01/26/24 1247 01/26/24 1447  TROPONINIHS 13 13     Chemistry Recent Labs  Lab 01/26/24 1247 01/27/24 0641  NA 131* 130*  K 3.9 4.0  CL  98 100  CO2 25 18*  GLUCOSE 97 71  BUN 17 17  CREATININE 0.74 0.76  CALCIUM  8.6* 8.5*  GFRNONAA >60 >60  ANIONGAP 8 12    Recent Labs  Lab 01/26/24 1247 01/27/24 0641  PROT 6.3* 6.9  ALBUMIN 3.0* 2.8*  AST 18 19  ALT 17 15  ALKPHOS 60 56  BILITOT 1.3* 1.6*   Lipids No results for input(s): "CHOL", "TRIG", "HDL", "LABVLDL", "LDLCALC", "CHOLHDL" in the last 168 hours.  Hematology Recent Labs  Lab 01/26/24 1247 01/27/24 0641  WBC 13.5* 14.9*  RBC 4.94 4.81  HGB 14.3 13.7  HCT 42.4 41.2  MCV 85.8 85.7  MCH 28.9 28.5  MCHC 33.7 33.3  RDW 12.8 13.0  PLT 242 275   Thyroid  No results for input(s): "TSH", "FREET4" in the last 168 hours.  BNPNo results for input(s): "BNP", "PROBNP" in the last 168 hours.  DDimer No results for input(s): "DDIMER" in the last 168 hours.   Radiology/Studies:  CT Angio Chest/Abd/Pel for Dissection W and/or Wo Contrast Result Date: 01/26/2024 CLINICAL DATA:  Sharp chest pain since this morning, abdominal pain, nausea and vomiting for 4 days, pain radiating from upper abdomen to back EXAM: CT ANGIOGRAPHY CHEST, ABDOMEN AND PELVIS TECHNIQUE: Non-contrast CT of the chest was initially obtained. Multidetector CT imaging through the chest, abdomen and pelvis was performed using the standard protocol during bolus administration of intravenous contrast. Multiplanar reconstructed images and MIPs were obtained and reviewed to evaluate the vascular anatomy. RADIATION DOSE REDUCTION: This exam was performed according to the departmental dose-optimization program which includes automated exposure control, adjustment of the mA and/or kV according to patient size and/or use of iterative reconstruction technique. CONTRAST:  75mL OMNIPAQUE  IOHEXOL  350 MG/ML SOLN COMPARISON:  01/26/2024 FINDINGS: CTA CHEST FINDINGS Cardiovascular: No evidence of thoracic aortic aneurysm or dissection. Great vessels are patent. Diffuse atherosclerosis of the thoracic aorta. Prominent left  ventricular dilatation. No pericardial effusion. Dense atherosclerosis throughout the coronary vasculature. There is technically adequate opacification of the pulmonary vasculature. No filling defects or pulmonary emboli. Mediastinum/Nodes: No significant findings within the thyroid , trachea, or esophagus. No pathologic adenopathy. Lungs/Pleura: Severe upper lobe predominant emphysema. No acute airspace disease, effusion, or pneumothorax. Mild bilateral bronchial wall thickening, greatest within the lower lobes, consistent with bronchitis or reactive airway disease. There is a 4 mm right upper lobe pulmonary nodule reference image 76/7, and a 3 mm right upper lobe pulmonary nodule reference image 70/7. Musculoskeletal: No acute or destructive bony abnormalities. Reconstructed images demonstrate no additional findings. Review of the MIP images confirms the above findings. CTA ABDOMEN AND PELVIS FINDINGS VASCULAR Aorta: Normal caliber aorta without aneurysm, dissection, vasculitis or significant stenosis. Diffuse atherosclerosis. Celiac: Patent without evidence of aneurysm, dissection, vasculitis or significant stenosis. SMA: There is moderate atherosclerosis within the proximal SMA, with estimated 50% stenosis proximally 3.5 cm from the origin. No aneurysm, dissection, or vasculitis. Renals: Both renal arteries are patent without evidence of aneurysm, dissection, vasculitis, fibromuscular dysplasia or significant stenosis. Mild atherosclerosis. IMA:  Patent without evidence of aneurysm, dissection, vasculitis or significant stenosis. Inflow: There is extensive atherosclerosis at the aortic bifurcation and bilateral common iliac arteries. On the left, there is estimated 70-90% stenosis at the origin of the left common iliac artery. Stenosis on the right estimated approaching 50%. There is complete occlusion of the left external iliac artery from its origin, with reconstitution of the left common femoral artery via  collaterals from the internal iliac system. Diffuse atherosclerosis of the common femoral artery, with focal high-grade stenosis of the proximal left superficial femoral artery estimated 70-90%. There is extensive atherosclerosis throughout the right common iliac external iliac, and internal iliac arteries, with long segment stenosis of the external iliac artery estimated 50-70%. Diffuse atherosclerosis of the right common femoral and superficial femoral arteries without focal high-grade stenosis. Veins: No obvious venous abnormality within the limitations of this arterial phase study. Review of the MIP images confirms the above findings. NON-VASCULAR Hepatobiliary: 1.5 cm right lobe liver cyst. Otherwise no focal liver abnormalities or biliary duct dilation. The gallbladder is decompressed, limiting its evaluation. Pancreas: Unremarkable. No pancreatic ductal dilatation or surrounding inflammatory changes. Spleen: Normal in size without focal abnormality. Adrenals/Urinary Tract: Left renal cortical cyst does not require specific imaging follow-up. Otherwise the kidneys enhance normally. No urinary tract calculi or obstructive uropathy. The adrenals and bladder appear unremarkable. Stomach/Bowel: There is segmental circumferential mural thickening within the mid sigmoid colon, extending approximately 5.7 cm in length reference image 269/6. There is an associated contained perforation measuring 6.1 x 4.0 x 6.0 cm, reference image 258/6. Overall, findings are consistent with perforated sigmoid diverticulitis with associated diverticular abscess. No bowel obstruction or ileus. Normal appendix right mid abdomen. Moderate retained stool throughout the colon. Lymphatic: No pathologic adenopathy within the abdomen or pelvis. Reproductive: Prostate is unremarkable. Other: No free fluid or free intraperitoneal gas. Small fat containing bilateral inguinal hernias. No bowel herniation. Musculoskeletal: No acute or destructive  bony abnormalities. Prior L4-5 discectomy and posterior fusion. Reconstructed images demonstrate no additional findings. Review of the MIP images confirms the above findings. IMPRESSION: Vascular: 1. No evidence of thoracoabdominal aortic aneurysm or dissection. 2. No evidence of pulmonary embolus. 3. Aortic Atherosclerosis (ICD10-I70.0). Coronary artery atherosclerosis. 4. 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 5. 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 6. Extensive atherosclerosis of the right iliac system, with stenosis estimated at 50-70% throughout the right external iliac artery. Nonvascular: 1. Segmental concentric mural thickening of the mid sigmoid colon, with associated contained perforation. Findings are compatible with perforated diverticulitis and diverticular abscess, though close follow-up is recommended to document resolution and exclude underlying neoplasm. 2. Bilateral bronchial wall thickening, greatest in the lower lobes, consistent with bronchitis or reactive airway disease. No acute airspace disease. 3.  Emphysema (ICD10-J43.9). 4. Moderate retained stool throughout the colon compatible with constipation. No obstruction or ileus. 5. Small bilateral fat containing inguinal hernias. 6. Multiple pulmonary nodules. Most significant: Right solid pulmonary nodule within the upper lobe measuring 4 mm. Per Fleischner Society Guidelines,if patient is low risk for malignancy, no routine follow-up imaging is recommended. If patient is high risk for malignancy, a non-contrast Chest CT at 12 months is optional. If performed and the nodule is stable at 12 months, no further follow-up is recommended. These guidelines do not apply to immunocompromised patients and patients with cancer. Follow up in patients  with significant  comorbidities as clinically warranted. For lung cancer screening, adhere to Lung-RADS guidelines. Reference: Radiology. 2017; 284(1):228-43. Critical Value/emergent results were called by telephone at the time of interpretation on 01/26/2024 at 3:09 pm to provider [facility] , who verbally acknowledged these results. Electronically Signed   By: Bobbye Burrow M.D.   On: 01/26/2024 15:14   DG Chest 2 View Result Date: 01/26/2024 CLINICAL DATA:  Chest pain EXAM: CHEST - 2 VIEW COMPARISON:  Chest radiograph dated 07/13/2021 FINDINGS: Normal lung volumes. No focal consolidations. No pleural effusion or pneumothorax. The heart size and mediastinal contours are within normal limits. Cervical spinal fixation hardware appears intact. IMPRESSION: No active cardiopulmonary disease. Electronically Signed   By: Limin  Xu M.D.   On: 01/26/2024 13:58     Assessment and Plan:   Antino Prude. is a 78 y.o. male with a hx of CAD s/p DES to RCA '02, DES to LAD, and overlapping DES to RCA '22, HFrEF/ICM, HTN, HLD who is being seen 01/27/2024 for the evaluation of preop evaluation at the request of Dr. Alla Isaacs.  Pre op evaluation Sigmoid diverticulitis with perforation and abscess CAD prior stenting to RCA/LAD Chronic HFrEF/ICM -- presented with several days of abd pain and n/v. Found to have diverticulitis w/perforation and abscess with elevated WBC -- seen by gen surgery, turned down for perc drain via IR. 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   GEN: No acute distress.   HEENT:  MMM, no JVD, no scleral icterus Cardiac: RRR, no murmurs, rubs, or gallops.  Respiratory: Clear to auscultation bilaterally. GI: Soft, nontender, non-distended  MS: No edema; No deformity. Neuro:  Nonfocal  Vasc:  +2 radial pulses  Patient is 78 year old male with the above listed medical issues who is here with a contained colonic wall perforation diverticular abscess.  We are asked to make a recommendation regarding preoperative restratification prior to potential general surgery.  The patient tells me that he is very active and endorses NYHA class I symptoms.  He denies any chest pain or shortness of breath.  He is able to do all of his activities of daily living without any issues.  He denies any significant shortness of breath, presyncope, palpitations, or heart failure symptoms.  He occasionally gets claudication symptoms when he exerts himself more than moderately mostly of his left lower extremity.  Will obtain an echocardiogram.  Otherwise I think he is at relatively low risk for cardiovascular complications following general surgery.   Venissa Nappi, MD  Pager [phone]

## 2024-01-27 NOTE — Progress Notes (Signed)
 PROGRESS NOTE    [name] [name].  AOZ:[id number] DOB: 1946/08/21 [name]: 01/26/2024 PCP: Galvin Jules, FNP  Brief Narrative:  78 y.o. male with medical history significant of hypertension, hyperlipidemia, CAD status post stent, chronic systolic CHF presenting with worsening abdominal pain.   Patient reports abdominal pain for the past 5 days.  Due to its persistent nature he came to the ED to be evaluated.   Denies fevers, chills, chest pain, shortness of breath, constipation, diarrhea, nausea, vomiting.   ED Course: Vital signs in the ED notable for blood pressure in the 120s-140 systolic.  Lab workup included CMP with sodium 131, calcium  8.6, protein 6.3, albumin 3.0, T. bili 1.3.  CBC with leukocytosis to 13.5.  Troponin negative x 1 with repeat pending.  Lipase normal.  Lactic acid pending.  Urinalysis pending.  Blood cultures pending.  Chest x-ray showed no acute abnormality.    CTA of the chest abdomen pelvis showed no evidence of aneurysm, dissection, pulmonary embolism.  There was noted to be 50% stenosis of the SMA, high-grade stenosis at the left common iliac and left common femoral artery of 70-90% as well as a left complete occlusion of the left external iliac artery with distal reconstitution.  Further iliac atherosclerosis noted on the right with stenosis in the 50-70% range.  In addition colonic wall thickening with evidence of diverticulitis with diverticular abscess and contained colonic wall perforation also noted.  In addition there was bronchial thickening, retaining stool, small hernias and pulmonary nodules.   General surgery consulted in the ED and agree with antibiotics with possible IR intervention to drain this abscess.  They will follow along.  Patient started on Zosyn.   Assessment & Plan:   Principal Problem:   Acute diverticulitis Active Problems:   CAD (coronary artery disease)   Mixed hyperlipidemia   HFrEF (heart failure with reduced ejection fraction)  (HCC)   Status post coronary artery stent placement   Primary hypertension   Perforation and abscess of large intestine concurrent with and due to diverticulitis   Diverticulitis with contained perforation and abscess-patient admitted with abdominal pain for 5 days prior to admission to hospital.  CT showed diverticulitis with diverticular abscess and contained perforation with leukocytosis.  General surgery and IR consulted.  IR could not find a window to place a drain.  White count remains elevated.  Continue Zosyn.  General surgery following.  Follow-up labs in AM.  Surgery has started him on clear liquids.  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  On aspirin  and statin for his CAD. - Will need to follow-up with vascular surgery outpatient   Hypertension - Continue home losartan    Hyperlipidemia - Continue home rosuvastatin    CAD > Status post stents - Continue home ASA, rosuvastatin    Chronic systolic CHF > Last echo I can see was in 2023 with EF 25-30%, G1 DD, normal RV function - Not on a diuretic - Continue home meds -repeat Echo pending  Estimated body mass index is 23.53 kg/m as calculated from the following:   Height as of this encounter: 5' 6.5" (1.689 m).   Weight as of this encounter: 67.1 kg.  DVT prophylaxis: scd Code Status: full Family Communication:none Disposition Plan:  Status is: Inpatient Remains inpatient appropriate because: acute illness   Consultants:  surgery  Procedures:none Antimicrobials: zosyn  Subjective: Better no nausea no bm  Objective: Vitals:  01/26/24 1936 01/27/24 0448 01/27/24 0900 01/27/24 1454  BP:  90/68 127/67 125/69  Pulse:  96 96 76  Resp: 20 17 16 16   Temp:  97.9 F (36.6 C) 97.9 F (36.6 C) 98.2 F (36.8 C)  TempSrc:   Oral   SpO2: 95% 94% 99% 96%  Weight:      Height:       No intake or output data in the 24 hours ending  01/27/24 1616 Filed Weights   01/26/24 1245  Weight: 67.1 kg    Examination:  General exam: Appears nad Respiratory system: Clear to auscultation. Respiratory effort normal. Cardiovascular system: S1 & S2 heard, RRR. No JVD, murmurs, rubs, gallops or clicks. No pedal edema. Gastrointestinal system: Abdomen is nondistended, soft and supra pubic tender. No organomegaly or masses felt. Normal bowel sounds heard. Central nervous system: Alert and oriented. No focal neurological deficits. Extremities: no edema   Data Reviewed: I have personally reviewed following labs and imaging studies  CBC: Recent Labs  Lab 01/26/24 1247 01/27/24 0641  WBC 13.5* 14.9*  HGB 14.3 13.7  HCT 42.4 41.2  MCV 85.8 85.7  PLT 242 275   Basic Metabolic Panel: Recent Labs  Lab 01/26/24 1247 01/27/24 0641  NA 131* 130*  K 3.9 4.0  CL 98 100  CO2 25 18*  GLUCOSE 97 71  BUN 17 17  CREATININE 0.74 0.76  CALCIUM  8.6* 8.5*   GFR: Estimated Creatinine Clearance: 70 mL/min (by C-G formula based on SCr of 0.76 mg/dL). Liver Function Tests: Recent Labs  Lab 01/26/24 1247 01/27/24 0641  AST 18 19  ALT 17 15  ALKPHOS 60 56  BILITOT 1.3* 1.6*  PROT 6.3* 6.9  ALBUMIN 3.0* 2.8*   Recent Labs  Lab 01/26/24 1247  LIPASE 28   No results for input(s): "AMMONIA" in the last 168 hours. Coagulation Profile: No results for input(s): "INR", "PROTIME" in the last 168 hours. Cardiac Enzymes: No results for input(s): "CKTOTAL", "CKMB", "CKMBINDEX", "TROPONINI" in the last 168 hours. BNP (last 3 results) No results for input(s): "PROBNP" in the last 8760 hours. HbA1C: No results for input(s): "HGBA1C" in the last 72 hours. CBG: No results for input(s): "GLUCAP" in the last 168 hours. Lipid Profile: No results for input(s): "CHOL", "HDL", "LDLCALC", "TRIG", "CHOLHDL", "LDLDIRECT" in the last 72 hours. Thyroid  Function Tests: No results for input(s): "TSH", "T4TOTAL", "FREET4", "T3FREE", "THYROIDAB"  in the last 72 hours. Anemia Panel: No results for input(s): "VITAMINB12", "FOLATE", "FERRITIN", "TIBC", "IRON", "RETICCTPCT" in the last 72 hours. Sepsis Labs: Recent Labs  Lab 01/26/24 1547  LATICACIDVEN 1.1    Recent Results (from the past 240 hours)  Culture, blood (routine x 2)     Status: None (Preliminary result)   Collection Time: 01/26/24  3:15 PM   Specimen: BLOOD RIGHT FOREARM  Result Value Ref Range Status   Specimen Description BLOOD RIGHT FOREARM  Final   Special Requests   Final    BOTTLES DRAWN AEROBIC AND ANAEROBIC Blood Culture adequate volume   Culture   Final    NO GROWTH < 24 HOURS Performed at [facility], [address], [place], [place] [address]    Report Status PENDING  Incomplete  Culture, blood (routine x 2)     Status: None (Preliminary result)   Collection Time: 01/26/24  3:35 PM   Specimen: BLOOD RIGHT WRIST  Result Value Ref Range Status   Specimen Description BLOOD RIGHT WRIST  Final   Special Requests  Final    BOTTLES DRAWN AEROBIC AND ANAEROBIC Blood Culture adequate volume   Culture   Final    NO GROWTH < 24 HOURS Performed at [facility], [address], [place], [place] [address]    Report Status PENDING  Incomplete         Radiology Studies: CT Angio Chest/Abd/Pel for Dissection W and/or Wo Contrast Result Date: 01/26/2024 CLINICAL DATA:  Sharp chest pain since this morning, abdominal pain, nausea and vomiting for 4 days, pain radiating from upper abdomen to back EXAM: CT ANGIOGRAPHY CHEST, ABDOMEN AND PELVIS TECHNIQUE: Non-contrast CT of the chest was initially obtained. Multidetector CT imaging through the chest, abdomen and pelvis was performed using the standard protocol during bolus administration of intravenous contrast. Multiplanar reconstructed images and MIPs were obtained and reviewed to evaluate the vascular anatomy. RADIATION DOSE REDUCTION: This exam was performed according to the departmental  dose-optimization program which includes automated exposure control, adjustment of the mA and/or kV according to patient size and/or use of iterative reconstruction technique. CONTRAST:  75mL OMNIPAQUE  IOHEXOL  350 MG/ML SOLN COMPARISON:  01/26/2024 FINDINGS: CTA CHEST FINDINGS Cardiovascular: No evidence of thoracic aortic aneurysm or dissection. Great vessels are patent. Diffuse atherosclerosis of the thoracic aorta. Prominent left ventricular dilatation. No pericardial effusion. Dense atherosclerosis throughout the coronary vasculature. There is technically adequate opacification of the pulmonary vasculature. No filling defects or pulmonary emboli. Mediastinum/Nodes: No significant findings within the thyroid , trachea, or esophagus. No pathologic adenopathy. Lungs/Pleura: Severe upper lobe predominant emphysema. No acute airspace disease, effusion, or pneumothorax. Mild bilateral bronchial wall thickening, greatest within the lower lobes, consistent with bronchitis or reactive airway disease. There is a 4 mm right upper lobe pulmonary nodule reference image 76/7, and a 3 mm right upper lobe pulmonary nodule reference image 70/7. Musculoskeletal: No acute or destructive bony abnormalities. Reconstructed images demonstrate no additional findings. Review of the MIP images confirms the above findings. CTA ABDOMEN AND PELVIS FINDINGS VASCULAR Aorta: Normal caliber aorta without aneurysm, dissection, vasculitis or significant stenosis. Diffuse atherosclerosis. Celiac: Patent without evidence of aneurysm, dissection, vasculitis or significant stenosis. SMA: There is moderate atherosclerosis within the proximal SMA, with estimated 50% stenosis proximally 3.5 cm from the origin. No aneurysm, dissection, or vasculitis. Renals: Both renal arteries are patent without evidence of aneurysm, dissection, vasculitis, fibromuscular dysplasia or significant stenosis. Mild atherosclerosis. IMA: Patent without evidence of aneurysm,  dissection, vasculitis or significant stenosis. Inflow: There is extensive atherosclerosis at the aortic bifurcation and bilateral common iliac arteries. On the left, there is estimated 70-90% stenosis at the origin of the left common iliac artery. Stenosis on the right estimated approaching 50%. 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 Diffuse atherosclerosis of the common femoral artery, with focal high-grade stenosis of the proximal left superficial femoral artery estimated 70-90%. There is extensive atherosclerosis throughout the right common iliac external iliac, and internal iliac arteries, with long segment stenosis of the external iliac artery estimated 50-70%. Diffuse atherosclerosis of the right common femoral and superficial femoral arteries without focal high-grade stenosis. Veins: No obvious venous abnormality within the limitations of this arterial phase study. Review of the MIP images confirms the above findings. NON-VASCULAR Hepatobiliary: 1.5 cm right lobe liver cyst. Otherwise no focal liver abnormalities or biliary duct dilation. The gallbladder is decompressed, limiting its evaluation. Pancreas: Unremarkable. No pancreatic ductal dilatation or surrounding inflammatory changes.  Spleen: Normal in size without focal abnormality. Adrenals/Urinary Tract: Left renal cortical cyst does not require specific imaging follow-up. Otherwise the kidneys enhance normally. No urinary tract calculi or obstructive uropathy. The adrenals and bladder appear unremarkable. Stomach/Bowel: There is segmental circumferential mural thickening within the mid sigmoid colon, extending approximately 5.7 cm in length reference image 269/6. There is an associated contained perforation measuring 6.1 x 4.0 x 6.0 cm, reference image 258/6. Overall, findings are consistent with perforated  sigmoid diverticulitis with associated diverticular abscess. No bowel obstruction or ileus. Normal appendix right mid abdomen. Moderate retained stool throughout the colon. Lymphatic: No pathologic adenopathy within the abdomen or pelvis. Reproductive: Prostate is unremarkable. Other: No free fluid or free intraperitoneal gas. Small fat containing bilateral inguinal hernias. No bowel herniation. Musculoskeletal: No acute or destructive bony abnormalities. Prior L4-5 discectomy and posterior fusion. Reconstructed images demonstrate no additional findings. Review of the MIP images confirms the above findings. IMPRESSION: Vascular: 1. No evidence of thoracoabdominal aortic aneurysm or dissection. 2. No evidence of pulmonary embolus. 3. Aortic Atherosclerosis (ICD10-I70.0). Coronary artery atherosclerosis. 4. 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 5. 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 6. Extensive atherosclerosis of the right iliac system, with stenosis estimated at 50-70% throughout the right external iliac artery. Nonvascular: 1. Segmental concentric mural thickening of the mid sigmoid colon, with associated contained perforation. Findings are compatible with perforated diverticulitis and diverticular abscess, though close follow-up is recommended to document resolution and exclude underlying neoplasm. 2. Bilateral bronchial wall thickening, greatest in the lower lobes, consistent with bronchitis or reactive airway disease. No acute airspace disease. 3.  Emphysema (ICD10-J43.9). 4. Moderate retained stool throughout the colon compatible with constipation. No obstruction or ileus. 5. Small bilateral fat containing inguinal hernias. 6. Multiple pulmonary nodules. Most significant: Right solid pulmonary nodule within the upper lobe  measuring 4 mm. Per Fleischner Society Guidelines,if patient is low risk for malignancy, no routine follow-up imaging is recommended. If patient is high risk for malignancy, a non-contrast Chest CT at 12 months is optional. If performed and the nodule is stable at 12 months, no further follow-up is recommended. These guidelines do not apply to immunocompromised patients and patients with cancer. Follow up in patients with significant comorbidities as clinically warranted. For lung cancer screening, adhere to Lung-RADS guidelines. Reference: Radiology. 2017; 284(1):228-43. Critical Value/emergent results were called by telephone at the time of interpretation on 01/26/2024 at 3:09 pm to provider [name] , who verbally acknowledged these results. Electronically Signed   By: Bobbye Burrow M.D.   On: 01/26/2024 15:14   DG Chest 2 View Result Date: 01/26/2024 CLINICAL DATA:  Chest pain EXAM: CHEST - 2 VIEW COMPARISON:  Chest radiograph dated 07/13/2021 FINDINGS: Normal lung volumes. No focal consolidations. No pleural effusion or pneumothorax. The heart size and mediastinal contours are within normal limits. Cervical spinal fixation hardware appears intact. IMPRESSION: No active cardiopulmonary disease. Electronically Signed   By: Limin  Xu M.D.   On: 01/26/2024 13:58    Scheduled Meds:  aspirin   81 mg Oral Daily   losartan   50 mg Oral Daily   rosuvastatin   20 mg Oral Daily   sodium chloride  flush  3 mL Intravenous Q12H   Continuous Infusions:  piperacillin-tazobactam (ZOSYN)  IV 3.375 g (01/27/24 1408)     LOS: 1 day    Time  spent: 50 min  Barbee Lew, MD  01/27/2024, 4:16 PM

## 2024-01-27 NOTE — Plan of Care (Signed)

## 2024-01-27 NOTE — Progress Notes (Signed)
 Subjective/Chief Complaint: Patient reports less abdominal pain than yesterday Passing flatus   Objective: Vital signs in last 24 hours: Temp:  [97.7 F (36.5 C)-98.8 F (37.1 C)] 97.9 F (36.6 C) (05/14 0900) Pulse Rate:  [83-96] 96 (05/14 0900) Resp:  [15-30] 16 (05/14 0900) BP: (90-143)/(67-78) 127/67 (05/14 0900) SpO2:  [94 %-100 %] 99 % (05/14 0900) Weight:  [67.1 kg] 67.1 kg (05/13 1245) Last BM Date : 01/26/24  Intake/Output from previous day: No intake/output data recorded. Intake/Output this shift: No intake/output data recorded.  Exam: Awake and alert Abdomen soft with mild tenderness LLQ and suprapubic.  No frank peritonitis  Lab Results:  Recent Labs    01/26/24 1247 01/27/24 0641  WBC 13.5* 14.9*  HGB 14.3 13.7  HCT 42.4 41.2  PLT 242 275   BMET Recent Labs    01/26/24 1247 01/27/24 0641  NA 131* 130*  K 3.9 4.0  CL 98 100  CO2 25 18*  GLUCOSE 97 71  BUN 17 17  CREATININE 0.74 0.76  CALCIUM  8.6* 8.5*   PT/INR No results for input(s): "LABPROT", "INR" in the last 72 hours. ABG No results for input(s): "PHART", "HCO3" in the last 72 hours.  Invalid input(s): "PCO2", "PO2"  Studies/Results: CT Angio Chest/Abd/Pel for Dissection W and/or Wo Contrast Result Date: 01/26/2024 CLINICAL DATA:  Sharp chest pain since this morning, abdominal pain, nausea and vomiting for 4 days, pain radiating from upper abdomen to back EXAM: CT ANGIOGRAPHY CHEST, ABDOMEN AND PELVIS TECHNIQUE: Non-contrast CT of the chest was initially obtained. Multidetector CT imaging through the chest, abdomen and pelvis was performed using the standard protocol during bolus administration of intravenous contrast. Multiplanar reconstructed images and MIPs were obtained and reviewed to evaluate the vascular anatomy. RADIATION DOSE REDUCTION: This exam was performed according to the departmental dose-optimization program which includes automated exposure control, adjustment of the  mA and/or kV according to patient size and/or use of iterative reconstruction technique. CONTRAST:  75mL OMNIPAQUE  IOHEXOL  350 MG/ML SOLN COMPARISON:  01/26/2024 FINDINGS: CTA CHEST FINDINGS Cardiovascular: No evidence of thoracic aortic aneurysm or dissection. Great vessels are patent. Diffuse atherosclerosis of the thoracic aorta. Prominent left ventricular dilatation. No pericardial effusion. Dense atherosclerosis throughout the coronary vasculature. There is technically adequate opacification of the pulmonary vasculature. No filling defects or pulmonary emboli. Mediastinum/Nodes: No significant findings within the thyroid , trachea, or esophagus. No pathologic adenopathy. Lungs/Pleura: Severe upper lobe predominant emphysema. No acute airspace disease, effusion, or pneumothorax. Mild bilateral bronchial wall thickening, greatest within the lower lobes, consistent with bronchitis or reactive airway disease. There is a 4 mm right upper lobe pulmonary nodule reference image 76/7, and a 3 mm right upper lobe pulmonary nodule reference image 70/7. Musculoskeletal: No acute or destructive bony abnormalities. Reconstructed images demonstrate no additional findings. Review of the MIP images confirms the above findings. CTA ABDOMEN AND PELVIS FINDINGS VASCULAR Aorta: Normal caliber aorta without aneurysm, dissection, vasculitis or significant stenosis. Diffuse atherosclerosis. Celiac: Patent without evidence of aneurysm, dissection, vasculitis or significant stenosis. SMA: There is moderate atherosclerosis within the proximal SMA, with estimated 50% stenosis proximally 3.5 cm from the origin. No aneurysm, dissection, or vasculitis. Renals: Both renal arteries are patent without evidence of aneurysm, dissection, vasculitis, fibromuscular dysplasia or significant stenosis. Mild atherosclerosis. IMA: Patent without evidence of aneurysm, dissection, vasculitis or significant stenosis. Inflow: There is extensive  atherosclerosis at the aortic bifurcation and bilateral common iliac arteries. On the left, there is estimated 70-90% stenosis at the origin of  the left common iliac artery. Stenosis on the right estimated approaching 50%. 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 Diffuse atherosclerosis of the common femoral artery, with focal high-grade stenosis of the proximal left superficial femoral artery estimated 70-90%. There is extensive atherosclerosis throughout the right common iliac external iliac, and internal iliac arteries, with long segment stenosis of the external iliac artery estimated 50-70%. Diffuse atherosclerosis of the right common femoral and superficial femoral arteries without focal high-grade stenosis. Veins: No obvious venous abnormality within the limitations of this arterial phase study. Review of the MIP images confirms the above findings. NON-VASCULAR Hepatobiliary: 1.5 cm right lobe liver cyst. Otherwise no focal liver abnormalities or biliary duct dilation. The gallbladder is decompressed, limiting its evaluation. Pancreas: Unremarkable. No pancreatic ductal dilatation or surrounding inflammatory changes. Spleen: Normal in size without focal abnormality. Adrenals/Urinary Tract: Left renal cortical cyst does not require specific imaging follow-up. Otherwise the kidneys enhance normally. No urinary tract calculi or obstructive uropathy. The adrenals and bladder appear unremarkable. Stomach/Bowel: There is segmental circumferential mural thickening within the mid sigmoid colon, extending approximately 5.7 cm in length reference image 269/6. There is an associated contained perforation measuring 6.1 x 4.0 x 6.0 cm, reference image 258/6. Overall, findings are consistent with perforated sigmoid diverticulitis with associated diverticular abscess. No bowel  obstruction or ileus. Normal appendix right mid abdomen. Moderate retained stool throughout the colon. Lymphatic: No pathologic adenopathy within the abdomen or pelvis. Reproductive: Prostate is unremarkable. Other: No free fluid or free intraperitoneal gas. Small fat containing bilateral inguinal hernias. No bowel herniation. Musculoskeletal: No acute or destructive bony abnormalities. Prior L4-5 discectomy and posterior fusion. Reconstructed images demonstrate no additional findings. Review of the MIP images confirms the above findings. IMPRESSION: Vascular: 1. No evidence of thoracoabdominal aortic aneurysm or dissection. 2. No evidence of pulmonary embolus. 3. Aortic Atherosclerosis (ICD10-I70.0). Coronary artery atherosclerosis. 4. 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 5. 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 6. Extensive atherosclerosis of the right iliac system, with stenosis estimated at 50-70% throughout the right external iliac artery. Nonvascular: 1. Segmental concentric mural thickening of the mid sigmoid colon, with associated contained perforation. Findings are compatible with perforated diverticulitis and diverticular abscess, though close follow-up is recommended to document resolution and exclude underlying neoplasm. 2. Bilateral bronchial wall thickening, greatest in the lower lobes, consistent with bronchitis or reactive airway disease. No acute airspace disease. 3.  Emphysema (ICD10-J43.9). 4. Moderate retained stool throughout the colon compatible with constipation. No obstruction or ileus. 5. Small bilateral fat containing inguinal hernias. 6. Multiple pulmonary nodules. Most significant: Right solid pulmonary nodule within the upper lobe measuring 4 mm. Per Fleischner Society Guidelines,if patient is low risk  for malignancy, no routine follow-up imaging is recommended. If patient is high risk for malignancy, a non-contrast Chest CT at 12 months is optional. If performed and the nodule is stable at 12 months, no further follow-up is recommended. These guidelines do not apply to immunocompromised patients and patients with cancer. Follow up in patients with significant comorbidities as clinically warranted. For lung cancer screening, adhere to Lung-RADS guidelines. Reference: Radiology. 2017; 284(1):228-43. Critical Value/emergent results were called by telephone at the time of interpretation on 01/26/2024 at 3:09 pm to  provider [facility] , who verbally acknowledged these results. Electronically Signed   By: Bobbye Burrow M.D.   On: 01/26/2024 15:14   DG Chest 2 View Result Date: 01/26/2024 CLINICAL DATA:  Chest pain EXAM: CHEST - 2 VIEW COMPARISON:  Chest radiograph dated 07/13/2021 FINDINGS: Normal lung volumes. No focal consolidations. No pleural effusion or pneumothorax. The heart size and mediastinal contours are within normal limits. Cervical spinal fixation hardware appears intact. IMPRESSION: No active cardiopulmonary disease. Electronically Signed   By: Limin  Xu M.D.   On: 01/26/2024 13:58    Anti-infectives: Anti-infectives (From admission, onward)    Start     Dose/Rate Route Frequency Ordered Stop   01/26/24 2200  piperacillin-tazobactam (ZOSYN) IVPB 3.375 g       Placed in "Followed by" Linked Group   3.375 g 12.5 mL/hr over 240 Minutes Intravenous Every 8 hours 01/26/24 1518     01/26/24 1530  piperacillin-tazobactam (ZOSYN) IVPB 3.375 g       Placed in "Followed by" Linked Group   3.375 g 100 mL/hr over 30 Minutes Intravenous  Once 01/26/24 1518 01/26/24 1621       Assessment/Plan: Sigmoid diverticulitis with perforation and abscess   -no window currently for IR to place a drain -WBC slightly higher.  Patient clinically better -will allow clear liquids -continue  antibiotics. -repeat CB in the morning. - failure to improve, or clinical deterioration, would warrant exploratory laparotomy, partial colectomy, colostomy. Given his cardiac history he would need cardiac evaluation for perioperative risk stratification and optimization.   Left external iliac artery occlusion HFpEF CAD PMH NSTEMI Sciatica  Seasonal allergies   Complex medical decision making  Oza Blumenthal MD 01/27/2024

## 2024-01-27 NOTE — Plan of Care (Signed)

## 2024-01-28 ENCOUNTER — Inpatient Hospital Stay (HOSPITAL_COMMUNITY)

## 2024-01-28 ENCOUNTER — Other Ambulatory Visit (HOSPITAL_COMMUNITY): Payer: Self-pay

## 2024-01-28 DIAGNOSIS — Z01810 Encounter for preprocedural cardiovascular examination: Secondary | ICD-10-CM

## 2024-01-28 DIAGNOSIS — K5792 Diverticulitis of intestine, part unspecified, without perforation or abscess without bleeding: Secondary | ICD-10-CM | POA: Diagnosis not present

## 2024-01-28 LAB — CBC
HCT: 37.4 % — ABNORMAL LOW (ref 39.0–52.0)
Hemoglobin: 13.1 g/dL (ref 13.0–17.0)
MCH: 29.6 pg (ref 26.0–34.0)
MCHC: 35 g/dL (ref 30.0–36.0)
MCV: 84.6 fL (ref 80.0–100.0)
Platelets: 263 10*3/uL (ref 150–400)
RBC: 4.42 MIL/uL (ref 4.22–5.81)
RDW: 13 % (ref 11.5–15.5)
WBC: 14.1 10*3/uL — ABNORMAL HIGH (ref 4.0–10.5)
nRBC: 0 % (ref 0.0–0.2)

## 2024-01-28 LAB — COMPREHENSIVE METABOLIC PANEL WITH GFR
ALT: 15 U/L (ref 0–44)
AST: 17 U/L (ref 15–41)
Albumin: 2.5 g/dL — ABNORMAL LOW (ref 3.5–5.0)
Alkaline Phosphatase: 56 U/L (ref 38–126)
Anion gap: 10 (ref 5–15)
BUN: 16 mg/dL (ref 8–23)
CO2: 22 mmol/L (ref 22–32)
Calcium: 8.2 mg/dL — ABNORMAL LOW (ref 8.9–10.3)
Chloride: 101 mmol/L (ref 98–111)
Creatinine, Ser: 0.75 mg/dL (ref 0.61–1.24)
GFR, Estimated: >60 mL/min (ref >60)
Glucose, Bld: 91 mg/dL (ref 70–99)
Potassium: 4.1 mmol/L (ref 3.5–5.1)
Sodium: 133 mmol/L — ABNORMAL LOW (ref 135–145)
Total Bilirubin: 1.2 mg/dL (ref 0.0–1.2)
Total Protein: 5.5 g/dL — ABNORMAL LOW (ref 6.5–8.1)

## 2024-01-28 LAB — ECHOCARDIOGRAM COMPLETE
AR max vel: 3.15 cm2
AV Area VTI: 2.91 cm2
AV Area mean vel: 3.06 cm2
AV Mean grad: 2 mmHg
AV Peak grad: 4.1 mmHg
Ao pk vel: 1.01 m/s
Calc EF: 39.3 %
Height: 66.5 in
S' Lateral: 4.3 cm
Single Plane A2C EF: 44.9 %
Single Plane A4C EF: 36 %
Weight: 2368 [oz_av]

## 2024-01-28 LAB — HEPARIN LEVEL (UNFRACTIONATED): Heparin Unfractionated: <0.1 [IU]/mL — ABNORMAL LOW (ref 0.30–0.70)

## 2024-01-28 MED ORDER — HEPARIN (PORCINE) 25000 UT/250ML-% IV SOLN
1150.0000 [IU]/h | INTRAVENOUS | Status: DC
  Administered 2024-01-28: 950 [IU]/h via INTRAVENOUS
  Administered 2024-01-29: 1150 [IU]/h via INTRAVENOUS
  Filled 2024-01-28 (×2): qty 250

## 2024-01-28 MED ORDER — PERFLUTREN LIPID MICROSPHERE
1.0000 mL | INTRAVENOUS | Status: AC | PRN
  Administered 2024-01-28: 6.5 mL via INTRAVENOUS

## 2024-01-28 MED ORDER — HEPARIN BOLUS VIA INFUSION
4000.0000 [IU] | Freq: Once | INTRAVENOUS | Status: AC
  Administered 2024-01-28: 4000 [IU] via INTRAVENOUS
  Filled 2024-01-28: qty 4000

## 2024-01-28 MED ORDER — BOOST / RESOURCE BREEZE PO LIQD CUSTOM
1.0000 | Freq: Three times a day (TID) | ORAL | Status: DC
  Administered 2024-01-28 – 2024-02-09 (×25): 1 via ORAL

## 2024-01-28 MED ORDER — METOPROLOL SUCCINATE ER 25 MG PO TB24
12.5000 mg | ORAL_TABLET | Freq: Every day | ORAL | Status: DC
  Administered 2024-01-28 – 2024-02-02 (×6): 12.5 mg via ORAL
  Filled 2024-01-28 (×7): qty 1

## 2024-01-28 NOTE — Progress Notes (Signed)
 PHARMACY - ANTICOAGULATION CONSULT NOTE  Pharmacy Consult for heparin  Indication: High risk for LV thrombus  Allergies  Allergen Reactions   Carvedilol  Diarrhea   Lipitor [Atorvastatin] Other (See Comments)    "Makes pt feel funky"   Plavix  [Clopidogrel ] Rash    Patient Measurements: Height: 5' 6.5" (168.9 cm) Weight: 67.1 kg (148 lb) IBW/kg (Calculated) : 64.95 HEPARIN  DW (KG): 67.1  Vital Signs: Temp: 98 F (36.7 C) (05/15 0748) Temp Source: Oral (05/15 0440) BP: 112/74 (05/15 0748) Pulse Rate: 71 (05/15 0748)  Labs: Recent Labs    01/26/24 1247 01/26/24 1447 01/27/24 0641 01/28/24 0735  HGB 14.3  --  13.7 13.1  HCT 42.4  --  41.2 37.4*  PLT 242  --  275 263  CREATININE 0.74  --  0.76 0.75  TROPONINIHS 13 13  --   --     Estimated Creatinine Clearance: 70 mL/min (by C-G formula based on SCr of 0.75 mg/dL).   Medical History: Past Medical History:  Diagnosis Date   Coronary artery disease    Erectile dysfunction    HFrEF (heart failure with reduced ejection fraction) (HCC)    Echocardiogram 07/14/21:  EF 25, global HK, inf HK worse, Gr 2 DD, normal RVSF, mild to mod MR, AV sclerosis w/o AS Echocardiogram 2/23: EF 25-30, global HK, GLS -12.9, normal RVSF, mild to mod MR, AV sclerosis w/o AS    History of hiatal hernia    a lone time ago   Hyperlipidemia    [place] fracture [facility]) 03/22/2020   Myocardial infarction (HCC)    x 2   Polio     Medications:  Medications Prior to Admission  Medication Sig Dispense Refill Last Dose/Taking   albuterol  (VENTOLIN  HFA) 108 (90 Base) MCG/ACT inhaler Inhale 2 puffs into the lungs every 6 (six) hours as needed for wheezing (cough). 8 g 2 Unknown   aspirin  81 MG chewable tablet Chew 81 mg by mouth daily.   01/26/2024 Morning   cholecalciferol  (VITAMIN D3) 25 MCG (1000 UNIT) tablet Take 1,000 Units by mouth daily.    01/25/2024   ketoconazole (NIZORAL) 2 % cream Apply 1 Application topically 2 (two) times daily.   01/25/2024    losartan  (COZAAR ) 50 MG tablet Take 1 tablet (50 mg total) by mouth daily. 30 tablet 3 01/25/2024   nitroGLYCERIN  (NITROSTAT ) 0.4 MG SL tablet Place 1 tablet (0.4 mg total) under the tongue every 5 (five) minutes as needed for chest pain. 10 tablet 2 Unknown   rosuvastatin  (CRESTOR ) 20 MG tablet Take 1 tablet (20 mg total) by mouth daily. 90 tablet 3 01/25/2024   augmented betamethasone dipropionate (DIPROLENE-AF) 0.05 % cream Apply 1 Application topically. (Patient not taking: Reported on 01/26/2024)   Not Taking   fluticasone  (FLONASE ) 50 MCG/ACT nasal spray Place 2 sprays into both nostrils daily. (Patient not taking: Reported on 01/26/2024) 16 g 6 Not Taking   Scheduled:   aspirin   81 mg Oral Daily   feeding supplement  1 Container Oral TID BM   losartan   50 mg Oral Daily   metoprolol  succinate  12.5 mg Oral Daily   rosuvastatin   20 mg Oral Daily   sodium chloride  flush  3 mL Intravenous Q12H    Assessment: 78 yo male with sigmoid diverticulitis with perforation and abscess. Echo results today show high risk LV thrombus and pharmacy consulted to dose heparin . No anticoagulants noted PTA.   -hg= 13.1  Goal of Therapy:  Heparin  level 0.3-0.7 units/ml  Monitor platelets by anticoagulation protocol: Yes   Plan:  -Heparin  bolus 4000 units x1 followed by 950 units/hr -Heparin  level in 8 hours and daily wth CBC daily  Baxter Limber, PharmD Clinical Pharmacist **Pharmacist phone directory can now be found on [email or web address] ([email or web address]).  Listed under [facility].

## 2024-01-28 NOTE — Progress Notes (Signed)
 Subjective: CC: Improved lower abdominal pain to a 4/10. No n/v. Tolerating cld. BM today.   This is his first episode of diverticulitis. He has had never a colonoscopy. No fhx IBD or colon ca that he is aware of.   Afebrile. No tachycardia or hypotension. WBC 14.1 (14.9).   Objective: Vital signs in last 24 hours: Temp:  [98 F (36.7 C)-98.2 F (36.8 C)] 98 F (36.7 C) (05/15 0748) Pulse Rate:  [71-77] 71 (05/15 0748) Resp:  [16-17] 17 (05/15 0748) BP: (112-126)/(63-74) 112/74 (05/15 0748) SpO2:  [94 %-96 %] 94 % (05/15 0748) Last BM Date : 01/28/24  Intake/Output from previous day: 05/14 0701 - 05/15 0700 In: 106.5 [IV Piggyback:106.5] Out: -  Intake/Output this shift: Total I/O In: 240 [P.O.:240] Out: -   PE: Gen:  Alert, NAD, pleasant Abd: Soft, ND, NT, +BS - reports his pain is in his back   Lab Results:  Recent Labs    01/27/24 0641 01/28/24 0735  WBC 14.9* 14.1*  HGB 13.7 13.1  HCT 41.2 37.4*  PLT 275 263   BMET Recent Labs    01/27/24 0641 01/28/24 0735  NA 130* 133*  K 4.0 4.1  CL 100 101  CO2 18* 22  GLUCOSE 71 91  BUN 17 16  CREATININE 0.76 0.75  CALCIUM  8.5* 8.2*   PT/INR No results for input(s): "LABPROT", "INR" in the last 72 hours. CMP     Component Value Date/Time   NA 133 (L) 01/28/2024 0735   NA 136 01/08/2024 1025   K 4.1 01/28/2024 0735   CL 101 01/28/2024 0735   CO2 22 01/28/2024 0735   GLUCOSE 91 01/28/2024 0735   BUN 16 01/28/2024 0735   BUN 11 01/08/2024 1025   CREATININE 0.75 01/28/2024 0735   CREATININE 0.93 03/29/2013 0923   CALCIUM  8.2 (L) 01/28/2024 0735   PROT 5.5 (L) 01/28/2024 0735   PROT 6.2 01/08/2024 1025   ALBUMIN 2.5 (L) 01/28/2024 0735   ALBUMIN 3.9 01/08/2024 1025   AST 17 01/28/2024 0735   ALT 15 01/28/2024 0735   ALKPHOS 56 01/28/2024 0735   BILITOT 1.2 01/28/2024 0735   BILITOT 0.8 01/08/2024 1025   GFRNONAA >60 01/28/2024 0735   GFRNONAA 85 03/29/2013 0923   GFRAA 104 05/01/2020  1031   GFRAA >89 03/29/2013 0923   Lipase     Component Value Date/Time   LIPASE 28 01/26/2024 1247    Studies/Results: ECHOCARDIOGRAM COMPLETE Result Date: 01/28/2024    ECHOCARDIOGRAM REPORT   Patient Name:   [name] [name]. Date of Exam: 01/28/2024 Medical Rec #:  [id number]           Height:       66.5 in Accession #:    [id number]          Weight:       148.0 lb Date of Birth:  [birth year]           BSA:          1.769 m Patient Age:    78 years            BP:           114/64 mmHg Patient Gender: M                   HR:           72 bpm. Exam Location:  Inpatient Procedure: 2D Echo, Cardiac Doppler and Color  Doppler (Both Spectral and Color            Flow Doppler were utilized during procedure). Indications:    Z01.818 Encounter for other preprocedural examination  History:        Patient has prior history of Echocardiogram examinations, most                 recent 02/11/2022.  Sonographer:    Andrena Bang Referring Phys: [phone] LINDSAY B ROBERTS IMPRESSIONS  1. There is swirling of definity contrast in the apex with no obvious LV thrombus. Findings c/w sluggish flow in the apex. Cannot rule out early forming thrombus. . Left ventricular ejection fraction, by estimation, is 25 to 30%. The left ventricle has severely decreased function. The left ventricle demonstrates global hypokinesis. There is mild left ventricular hypertrophy of the infero-lateral segment. Left ventricular diastolic function could not be evaluated.  2. Right ventricular systolic function is mildly reduced. The right ventricular size is normal. Tricuspid regurgitation signal is inadequate for assessing PA pressure.  3. The mitral valve is degenerative. Trivial mitral valve regurgitation. No evidence of mitral stenosis.  4. The aortic valve is tricuspid. Aortic valve regurgitation is not visualized. Aortic valve sclerosis/calcification is present, without any evidence of aortic stenosis. Aortic valve area, by VTI measures 2.91 cm.  Aortic valve mean gradient measures 2.0 mmHg. Aortic valve Vmax measures 1.01 m/s.  5. The inferior vena cava is normal in size with greater than 50% respiratory variability, suggesting right atrial pressure of 3 mmHg. FINDINGS  Left Ventricle: There is swirling of definity contrast in the apex with no obvious LV thrombus. Findings c/w sluggish flow in the apex. Cannot rule out early forming thrombus. Left ventricular ejection fraction, by estimation, is 25 to 30%. The left ventricle has severely decreased function. The left ventricle demonstrates global hypokinesis. Definity contrast agent was given IV to delineate the left ventricular endocardial borders. The left ventricular internal cavity size was normal in size. There  is mild left ventricular hypertrophy of the infero-lateral segment. Abnormal (paradoxical) septal motion, consistent with left bundle branch block. Left ventricular diastolic function could not be evaluated. Right Ventricle: The right ventricular size is normal. No increase in right ventricular wall thickness. Right ventricular systolic function is mildly reduced. Tricuspid regurgitation signal is inadequate for assessing PA pressure. Left Atrium: Left atrial size was normal in size. Right Atrium: Right atrial size was normal in size. Pericardium: There is no evidence of pericardial effusion. Mitral Valve: The mitral valve is degenerative in appearance. There is mild thickening of the mitral valve leaflet(s). There is mild calcification of the mitral valve leaflet(s). Trivial mitral valve regurgitation. No evidence of mitral valve stenosis. Tricuspid Valve: The tricuspid valve is normal in structure. Tricuspid valve regurgitation is trivial. No evidence of tricuspid stenosis. Aortic Valve: The aortic valve is tricuspid. Aortic valve regurgitation is not visualized. Aortic valve sclerosis/calcification is present, without any evidence of aortic stenosis. Aortic valve mean gradient measures 2.0  mmHg. Aortic valve peak gradient measures 4.1 mmHg. Aortic valve area, by VTI measures 2.91 cm. Pulmonic Valve: The pulmonic valve was normal in structure. Pulmonic valve regurgitation is not visualized. No evidence of pulmonic stenosis. Aorta: The aortic root is normal in size and structure. Venous: The inferior vena cava is normal in size with greater than 50% respiratory variability, suggesting right atrial pressure of 3 mmHg. IAS/Shunts: No atrial level shunt detected by color flow Doppler.  LEFT VENTRICLE PLAX 2D LVIDd:  5.00 cm      Diastology LVIDs:         4.30 cm      LV e' lateral: 12.80 cm/s LV PW:         1.40 cm LV IVS:        0.80 cm LVOT diam:     2.20 cm LV SV:         56 LV SV Index:   32 LVOT Area:     3.80 cm  LV Volumes (MOD) LV vol d, MOD A2C: 138.0 ml LV vol d, MOD A4C: 129.0 ml LV vol s, MOD A2C: 76.0 ml LV vol s, MOD A4C: 82.5 ml LV SV MOD A2C:     62.0 ml LV SV MOD A4C:     129.0 ml LV SV MOD BP:      52.2 ml RIGHT VENTRICLE RV S prime:     7.40 cm/s TAPSE (M-mode): 1.4 cm LEFT ATRIUM             Index LA diam:        3.10 cm 1.75 cm/m LA Vol (A2C):   37.3 ml 21.08 ml/m LA Vol (A4C):   27.1 ml 15.32 ml/m LA Biplane Vol: 31.6 ml 17.86 ml/m  AORTIC VALVE AV Area (Vmax):    3.15 cm AV Area (Vmean):   3.06 cm AV Area (VTI):     2.91 cm AV Vmax:           101.00 cm/s AV Vmean:          65.800 cm/s AV VTI:            0.192 m AV Peak Grad:      4.1 mmHg AV Mean Grad:      2.0 mmHg LVOT Vmax:         83.80 cm/s LVOT Vmean:        52.900 cm/s LVOT VTI:          0.147 m LVOT/AV VTI ratio: 0.77  SHUNTS Systemic VTI:  0.15 m Systemic Diam: 2.20 cm Gaylyn Keas MD Electronically signed by Gaylyn Keas MD Signature Date/Time: 01/28/2024/10:01:41 AM    Final    CT Angio Chest/Abd/Pel for Dissection W and/or Wo Contrast Result Date: 01/26/2024 CLINICAL DATA:  Sharp chest pain since this morning, abdominal pain, nausea and vomiting for 4 days, pain radiating from upper abdomen to back EXAM:  CT ANGIOGRAPHY CHEST, ABDOMEN AND PELVIS TECHNIQUE: Non-contrast CT of the chest was initially obtained. Multidetector CT imaging through the chest, abdomen and pelvis was performed using the standard protocol during bolus administration of intravenous contrast. Multiplanar reconstructed images and MIPs were obtained and reviewed to evaluate the vascular anatomy. RADIATION DOSE REDUCTION: This exam was performed according to the departmental dose-optimization program which includes automated exposure control, adjustment of the mA and/or kV according to patient size and/or use of iterative reconstruction technique. CONTRAST:  75mL OMNIPAQUE  IOHEXOL  350 MG/ML SOLN COMPARISON:  01/26/2024 FINDINGS: CTA CHEST FINDINGS Cardiovascular: No evidence of thoracic aortic aneurysm or dissection. Great vessels are patent. Diffuse atherosclerosis of the thoracic aorta. Prominent left ventricular dilatation. No pericardial effusion. Dense atherosclerosis throughout the coronary vasculature. There is technically adequate opacification of the pulmonary vasculature. No filling defects or pulmonary emboli. Mediastinum/Nodes: No significant findings within the thyroid , trachea, or esophagus. No pathologic adenopathy. Lungs/Pleura: Severe upper lobe predominant emphysema. No acute airspace disease, effusion, or pneumothorax. Mild bilateral bronchial wall thickening, greatest within the lower lobes, consistent with bronchitis or  reactive airway disease. There is a 4 mm right upper lobe pulmonary nodule reference image 76/7, and a 3 mm right upper lobe pulmonary nodule reference image 70/7. Musculoskeletal: No acute or destructive bony abnormalities. Reconstructed images demonstrate no additional findings. Review of the MIP images confirms the above findings. CTA ABDOMEN AND PELVIS FINDINGS VASCULAR Aorta: Normal caliber aorta without aneurysm, dissection, vasculitis or significant stenosis. Diffuse atherosclerosis. Celiac: Patent without  evidence of aneurysm, dissection, vasculitis or significant stenosis. SMA: There is moderate atherosclerosis within the proximal SMA, with estimated 50% stenosis proximally 3.5 cm from the origin. No aneurysm, dissection, or vasculitis. Renals: Both renal arteries are patent without evidence of aneurysm, dissection, vasculitis, fibromuscular dysplasia or significant stenosis. Mild atherosclerosis. IMA: Patent without evidence of aneurysm, dissection, vasculitis or significant stenosis. Inflow: There is extensive atherosclerosis at the aortic bifurcation and bilateral common iliac arteries. On the left, there is estimated 70-90% stenosis at the origin of the left common iliac artery. Stenosis on the right estimated approaching 50%. 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 Diffuse atherosclerosis of the common femoral artery, with focal high-grade stenosis of the proximal left superficial femoral artery estimated 70-90%. There is extensive atherosclerosis throughout the right common iliac external iliac, and internal iliac arteries, with long segment stenosis of the external iliac artery estimated 50-70%. Diffuse atherosclerosis of the right common femoral and superficial femoral arteries without focal high-grade stenosis. Veins: No obvious venous abnormality within the limitations of this arterial phase study. Review of the MIP images confirms the above findings. NON-VASCULAR Hepatobiliary: 1.5 cm right lobe liver cyst. Otherwise no focal liver abnormalities or biliary duct dilation. The gallbladder is decompressed, limiting its evaluation. Pancreas: Unremarkable. No pancreatic ductal dilatation or surrounding inflammatory changes. Spleen: Normal in size without focal abnormality. Adrenals/Urinary Tract: Left renal cortical cyst does not require specific imaging follow-up.  Otherwise the kidneys enhance normally. No urinary tract calculi or obstructive uropathy. The adrenals and bladder appear unremarkable. Stomach/Bowel: There is segmental circumferential mural thickening within the mid sigmoid colon, extending approximately 5.7 cm in length reference image 269/6. There is an associated contained perforation measuring 6.1 x 4.0 x 6.0 cm, reference image 258/6. Overall, findings are consistent with perforated sigmoid diverticulitis with associated diverticular abscess. No bowel obstruction or ileus. Normal appendix right mid abdomen. Moderate retained stool throughout the colon. Lymphatic: No pathologic adenopathy within the abdomen or pelvis. Reproductive: Prostate is unremarkable. Other: No free fluid or free intraperitoneal gas. Small fat containing bilateral inguinal hernias. No bowel herniation. Musculoskeletal: No acute or destructive bony abnormalities. Prior L4-5 discectomy and posterior fusion. Reconstructed images demonstrate no additional findings. Review of the MIP images confirms the above findings. IMPRESSION: Vascular: 1. No evidence of thoracoabdominal aortic aneurysm or dissection. 2. No evidence of pulmonary embolus. 3. Aortic Atherosclerosis (ICD10-I70.0). Coronary artery atherosclerosis. 4. 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 5. 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 6. Extensive atherosclerosis of the right iliac system, with stenosis estimated at 50-70% throughout the right external iliac artery. Nonvascular: 1. Segmental concentric mural thickening of the mid sigmoid colon, with associated contained perforation. Findings are compatible with perforated diverticulitis and diverticular abscess, though close follow-up is recommended to document resolution and exclude underlying  neoplasm. 2.  Bilateral bronchial wall thickening, greatest in the lower lobes, consistent with bronchitis or reactive airway disease. No acute airspace disease. 3.  Emphysema (ICD10-J43.9). 4. Moderate retained stool throughout the colon compatible with constipation. No obstruction or ileus. 5. Small bilateral fat containing inguinal hernias. 6. Multiple pulmonary nodules. Most significant: Right solid pulmonary nodule within the upper lobe measuring 4 mm. Per Fleischner Society Guidelines,if patient is low risk for malignancy, no routine follow-up imaging is recommended. If patient is high risk for malignancy, a non-contrast Chest CT at 12 months is optional. If performed and the nodule is stable at 12 months, no further follow-up is recommended. These guidelines do not apply to immunocompromised patients and patients with cancer. Follow up in patients with significant comorbidities as clinically warranted. For lung cancer screening, adhere to Lung-RADS guidelines. Reference: Radiology. 2017; 284(1):228-43. Critical Value/emergent results were called by telephone at the time of interpretation on 01/26/2024 at 3:09 pm to provider [facility] , who verbally acknowledged these results. Electronically Signed   By: Bobbye Burrow M.D.   On: 01/26/2024 15:14   DG Chest 2 View Result Date: 01/26/2024 CLINICAL DATA:  Chest pain EXAM: CHEST - 2 VIEW COMPARISON:  Chest radiograph dated 07/13/2021 FINDINGS: Normal lung volumes. No focal consolidations. No pleural effusion or pneumothorax. The heart size and mediastinal contours are within normal limits. Cervical spinal fixation hardware appears intact. IMPRESSION: No active cardiopulmonary disease. Electronically Signed   By: Limin  Xu M.D.   On: 01/26/2024 13:58    Anti-infectives: Anti-infectives (From admission, onward)    Start     Dose/Rate Route Frequency Ordered Stop   01/26/24 2200  piperacillin-tazobactam (ZOSYN) IVPB 3.375 g       Placed in "Followed by"  Linked Group   3.375 g 12.5 mL/hr over 240 Minutes Intravenous Every 8 hours 01/26/24 1518     01/26/24 1530  piperacillin-tazobactam (ZOSYN) IVPB 3.375 g       Placed in "Followed by" Linked Group   3.375 g 100 mL/hr over 30 Minutes Intravenous  Once 01/26/24 1518 01/26/24 1621        Assessment/Plan Sigmoid diverticulitis with perforation and abscess  - CTA abdomen pelvis shows circumferential wall thickening of the mid sigmoid colon with contained perforation (6.1 x 4.0 x 6.0 cm).  - IR note 5/13 reports there is no window for percutaneous drain placement.  - HDS without fever, tachycardia or hypotension. WBC downtrending. No peritonitis on exam. No current indication for emergency surgery - Cont IV abx - Adv to FLD - Cards has seen and plans echo today. Otherwise they think he is at relatively low risk for cardiovascular complications following general surgery.  - Hopefully patient will improve with conservative treatment.  If patient fails to improve they may require repeating imaging, drain placement, or surgical intervention resulting in a colectomy/colostomy.  This was discussed with the patient. - If patient improves with conservative therapies would recommend colonoscopy in ~6-8 weeks.   - We will follow with you  FEN - FLD, IVF per primary  VTE - SCDs, okay for chem ppx from a general surgery standpoint ID - Zosyn  PAD HFrEF CAD HLD HTN Remote H/O polio   I reviewed nursing notes, hospitalist notes, last 24 h vitals and pain scores, last 48 h intake and output, last 24 h labs and trends, and last 24 h imaging results.   LOS: 2 days    [name] [name], [facility] 01/28/2024, 10:32 AM Please see Amion  for pager number during day hours 7:00am-4:30pm

## 2024-01-28 NOTE — TOC Benefit Eligibility Note (Signed)
 Pharmacy Patient Advocate Encounter  Insurance verification completed.    The patient is insured through [employer]. Patient has Medicare and is not eligible for a copay card, but may be able to apply for patient assistance or Medicare RX Payment Plan (Patient Must reach out to their plan, if eligible for payment plan), if available.    Ran test claim for Eliquis 5mg  and the current 30 day co-pay is $378.48. ($333.48 deductible and $45 copay)  Ran test claim for Xarelto 20mg  and the current 30 day co-pay is $378.48. ($333.48 deductible and $45 copay)   This test claim was processed through [facility]- copay amounts may vary at other pharmacies due to [employer], or as the patient moves through the different stages of their insurance plan.

## 2024-01-28 NOTE — Progress Notes (Signed)
*  PRELIMINARY RESULTS* Echocardiogram 2D Echocardiogram has been performed.  [name] [name] 01/28/2024, 9:24 AM

## 2024-01-28 NOTE — Care Management Important Message (Signed)
 Important Message  Patient Details  Name: [name] [name]. MRN: [id number] Date of Birth: [birth year]   Important Message Given:  Yes - Medicare IM     Felix Host 01/28/2024, 10:46 AM

## 2024-01-28 NOTE — Progress Notes (Signed)
 PROGRESS NOTE    [name] [name].  EAV:[id number] DOB: 1946-09-11 [name]: 01/26/2024 PCP: Galvin Jules, FNP  Brief Narrative:  78 y.o. male with medical history significant of hypertension, hyperlipidemia, CAD status post stent, chronic systolic CHF presenting with worsening abdominal pain.   Patient reports abdominal pain for the past 5 days.  Due to its persistent nature he came to the ED to be evaluated.   Denies fevers, chills, chest pain, shortness of breath, constipation, diarrhea, nausea, vomiting.   ED Course: Vital signs in the ED notable for blood pressure in the 120s-140 systolic.  Lab workup included CMP with sodium 131, calcium  8.6, protein 6.3, albumin 3.0, T. bili 1.3.  CBC with leukocytosis to 13.5.  Troponin negative x 1 with repeat pending.  Lipase normal.  Lactic acid pending.  Urinalysis pending.  Blood cultures pending.  Chest x-ray showed no acute abnormality.    CTA of the chest abdomen pelvis showed no evidence of aneurysm, dissection, pulmonary embolism.  There was noted to be 50% stenosis of the SMA, high-grade stenosis at the left common iliac and left common femoral artery of 70-90% as well as a left complete occlusion of the left external iliac artery with distal reconstitution.  Further iliac atherosclerosis noted on the right with stenosis in the 50-70% range.  In addition colonic wall thickening with evidence of diverticulitis with diverticular abscess and contained colonic wall perforation also noted.  In addition there was bronchial thickening, retaining stool, small hernias and pulmonary nodules.   General surgery consulted in the ED and agree with antibiotics with possible IR intervention to drain this abscess.  They will follow along.  Patient started on Zosyn.   Assessment & Plan:   Principal Problem:   Acute diverticulitis Active Problems:   CAD (coronary artery disease)   Mixed hyperlipidemia   HFrEF (heart failure with reduced ejection fraction)  (HCC)   Status post coronary artery stent placement   Primary hypertension   Perforation and abscess of large intestine concurrent with and due to diverticulitis   Diverticulitis with contained perforation and abscess-patient admitted with abdominal pain for 5 days prior to admission to hospital.  CT showed diverticulitis with diverticular abscess and contained perforation with leukocytosis.  General surgery and IR consulted.  IR could not find a window to place a drain.  White count remains elevated.   Continue Zosyn.   General surgery following.   Surgery has started him on clear liquids.  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  On aspirin  and statin for his CAD. - Will need to follow-up with vascular surgery outpatient   Hypertension - Continue home losartan    Hyperlipidemia - Continue home rosuvastatin    CAD > Status post stents - Continue home ASA, rosuvastatin    Chronic systolic CHF > Last echo I can see was in 2023 with EF 25-30%, G1 DD, normal RV function - Not on a diuretic - Continue home meds -repeat Echo -left ventricular ejection fraction 25 to 30% severely decreased function and demonstrate global hypokinesis and mild LVH.  Sluggish flow at the apex cannot rule out early thrombus.  Right ventricular systolic function mild reduced Cardiology following.  Estimated body mass index is 23.53 kg/m as calculated from the following:   Height as of this encounter: 5' 6.5" (1.689 m).   Weight as of this encounter: 67.1 kg.  DVT prophylaxis: scd Code Status: full Family  Communication:none Disposition Plan:  Status is: Inpatient Remains inpatient appropriate because: acute illness   Consultants:  surgery  Procedures:none Antimicrobials: zosyn  Subjective: He seems to tolerate clear liquid diets.  He feels his pain in the lower part of his abdomen is improved since yesterday.  He was not  tender like yesterday either today.  Objective: Vitals:   01/27/24 1454 01/27/24 1952 01/28/24 0440 01/28/24 0748  BP: 125/69 126/63 114/64 112/74  Pulse: 76 77 76 71  Resp: 16  17 17   Temp: 98.2 F (36.8 C) 98.1 F (36.7 C) 98 F (36.7 C) 98 F (36.7 C)  TempSrc:  Oral Oral   SpO2: 96% 95% 96% 94%  Weight:      Height:        Intake/Output Summary (Last 24 hours) at 01/28/2024 1059 Last data filed at 01/28/2024 0900 Gross per 24 hour  Intake 466.49 ml  Output --  Net 466.49 ml   Filed Weights   01/26/24 1245  Weight: 67.1 kg    Examination:  General exam: Appears nad Respiratory system: Clear to auscultation. Respiratory effort normal. Cardiovascular system: S1 & S2 heard, RRR. No JVD, murmurs, rubs, gallops or clicks. No pedal edema. Gastrointestinal system: Abdomen is nondistended, soft and supra pubic tender decreased compared to yesterday no organomegaly or masses felt. Normal bowel sounds heard. Central nervous system: Alert and oriented. No focal neurological deficits. Extremities: no edema   Data Reviewed: I have personally reviewed following labs and imaging studies  CBC: Recent Labs  Lab 01/26/24 1247 01/27/24 0641 01/28/24 0735  WBC 13.5* 14.9* 14.1*  HGB 14.3 13.7 13.1  HCT 42.4 41.2 37.4*  MCV 85.8 85.7 84.6  PLT 242 275 263   Basic Metabolic Panel: Recent Labs  Lab 01/26/24 1247 01/27/24 0641 01/28/24 0735  NA 131* 130* 133*  K 3.9 4.0 4.1  CL 98 100 101  CO2 25 18* 22  GLUCOSE 97 71 91  BUN 17 17 16   CREATININE 0.74 0.76 0.75  CALCIUM  8.6* 8.5* 8.2*   GFR: Estimated Creatinine Clearance: 70 mL/min (by C-G formula based on SCr of 0.75 mg/dL). Liver Function Tests: Recent Labs  Lab 01/26/24 1247 01/27/24 0641 01/28/24 0735  AST 18 19 17   ALT 17 15 15   ALKPHOS 60 56 56  BILITOT 1.3* 1.6* 1.2  PROT 6.3* 6.9 5.5*  ALBUMIN 3.0* 2.8* 2.5*   Recent Labs  Lab 01/26/24 1247  LIPASE 28   No results for input(s): "AMMONIA" in  the last 168 hours. Coagulation Profile: No results for input(s): "INR", "PROTIME" in the last 168 hours. Cardiac Enzymes: No results for input(s): "CKTOTAL", "CKMB", "CKMBINDEX", "TROPONINI" in the last 168 hours. BNP (last 3 results) No results for input(s): "PROBNP" in the last 8760 hours. HbA1C: No results for input(s): "HGBA1C" in the last 72 hours. CBG: No results for input(s): "GLUCAP" in the last 168 hours. Lipid Profile: No results for input(s): "CHOL", "HDL", "LDLCALC", "TRIG", "CHOLHDL", "LDLDIRECT" in the last 72 hours. Thyroid  Function Tests: No results for input(s): "TSH", "T4TOTAL", "FREET4", "T3FREE", "THYROIDAB" in the last 72 hours. Anemia Panel: No results for input(s): "VITAMINB12", "FOLATE", "FERRITIN", "TIBC", "IRON", "RETICCTPCT" in the last 72 hours. Sepsis Labs: Recent Labs  Lab 01/26/24 1547  LATICACIDVEN 1.1    Recent Results (from the past 240 hours)  Culture, blood (routine x 2)     Status: None (Preliminary result)   Collection Time: 01/26/24  3:15 PM   Specimen: BLOOD RIGHT FOREARM  Result Value  Ref Range Status   Specimen Description BLOOD RIGHT FOREARM  Final   Special Requests   Final    BOTTLES DRAWN AEROBIC AND ANAEROBIC Blood Culture adequate volume   Culture   Final    NO GROWTH 2 DAYS Performed at [facility], [address], [place], [place] [address]    Report Status PENDING  Incomplete  Culture, blood (routine x 2)     Status: None (Preliminary result)   Collection Time: 01/26/24  3:35 PM   Specimen: BLOOD RIGHT WRIST  Result Value Ref Range Status   Specimen Description BLOOD RIGHT WRIST  Final   Special Requests   Final    BOTTLES DRAWN AEROBIC AND ANAEROBIC Blood Culture adequate volume   Culture   Final    NO GROWTH 2 DAYS Performed at [facility], [address], [place], [place] [address]    Report Status PENDING  Incomplete         Radiology Studies: ECHOCARDIOGRAM COMPLETE Result Date: 01/28/2024     ECHOCARDIOGRAM REPORT   Patient Name:   [name] [name]. Date of Exam: 01/28/2024 Medical Rec #:  [id number]           Height:       66.5 in Accession #:    [id number]          Weight:       148.0 lb Date of Birth:  [birth year]           BSA:          1.769 m Patient Age:    78 years            BP:           114/64 mmHg Patient Gender: M                   HR:           72 bpm. Exam Location:  Inpatient Procedure: 2D Echo, Cardiac Doppler and Color Doppler (Both Spectral and Color            Flow Doppler were utilized during procedure). Indications:    Z01.818 Encounter for other preprocedural examination  History:        Patient has prior history of Echocardiogram examinations, most                 recent 02/11/2022.  Sonographer:    Andrena Bang Referring Phys: [phone] LINDSAY B ROBERTS IMPRESSIONS  1. There is swirling of definity contrast in the apex with no obvious LV thrombus. Findings c/w sluggish flow in the apex. Cannot rule out early forming thrombus. . Left ventricular ejection fraction, by estimation, is 25 to 30%. The left ventricle has severely decreased function. The left ventricle demonstrates global hypokinesis. There is mild left ventricular hypertrophy of the infero-lateral segment. Left ventricular diastolic function could not be evaluated.  2. Right ventricular systolic function is mildly reduced. The right ventricular size is normal. Tricuspid regurgitation signal is inadequate for assessing PA pressure.  3. The mitral valve is degenerative. Trivial mitral valve regurgitation. No evidence of mitral stenosis.  4. The aortic valve is tricuspid. Aortic valve regurgitation is not visualized. Aortic valve sclerosis/calcification is present, without any evidence of aortic stenosis. Aortic valve area, by VTI measures 2.91 cm. Aortic valve mean gradient measures 2.0 mmHg. Aortic valve Vmax measures 1.01 m/s.  5. The inferior vena cava is normal in size with greater than 50% respiratory variability,  suggesting right atrial pressure of 3 mmHg. FINDINGS  Left Ventricle: There is swirling of definity contrast in the apex with no obvious LV thrombus. Findings c/w sluggish flow in the apex. Cannot rule out early forming thrombus. Left ventricular ejection fraction, by estimation, is 25 to 30%. The left ventricle has severely decreased function. The left ventricle demonstrates global hypokinesis. Definity contrast agent was given IV to delineate the left ventricular endocardial borders. The left ventricular internal cavity size was normal in size. There  is mild left ventricular hypertrophy of the infero-lateral segment. Abnormal (paradoxical) septal motion, consistent with left bundle branch block. Left ventricular diastolic function could not be evaluated. Right Ventricle: The right ventricular size is normal. No increase in right ventricular wall thickness. Right ventricular systolic function is mildly reduced. Tricuspid regurgitation signal is inadequate for assessing PA pressure. Left Atrium: Left atrial size was normal in size. Right Atrium: Right atrial size was normal in size. Pericardium: There is no evidence of pericardial effusion. Mitral Valve: The mitral valve is degenerative in appearance. There is mild thickening of the mitral valve leaflet(s). There is mild calcification of the mitral valve leaflet(s). Trivial mitral valve regurgitation. No evidence of mitral valve stenosis. Tricuspid Valve: The tricuspid valve is normal in structure. Tricuspid valve regurgitation is trivial. No evidence of tricuspid stenosis. Aortic Valve: The aortic valve is tricuspid. Aortic valve regurgitation is not visualized. Aortic valve sclerosis/calcification is present, without any evidence of aortic stenosis. Aortic valve mean gradient measures 2.0 mmHg. Aortic valve peak gradient measures 4.1 mmHg. Aortic valve area, by VTI measures 2.91 cm. Pulmonic Valve: The pulmonic valve was normal in structure. Pulmonic valve  regurgitation is not visualized. No evidence of pulmonic stenosis. Aorta: The aortic root is normal in size and structure. Venous: The inferior vena cava is normal in size with greater than 50% respiratory variability, suggesting right atrial pressure of 3 mmHg. IAS/Shunts: No atrial level shunt detected by color flow Doppler.  LEFT VENTRICLE PLAX 2D LVIDd:         5.00 cm      Diastology LVIDs:         4.30 cm      LV e' lateral: 12.80 cm/s LV PW:         1.40 cm LV IVS:        0.80 cm LVOT diam:     2.20 cm LV SV:         56 LV SV Index:   32 LVOT Area:     3.80 cm  LV Volumes (MOD) LV vol d, MOD A2C: 138.0 ml LV vol d, MOD A4C: 129.0 ml LV vol s, MOD A2C: 76.0 ml LV vol s, MOD A4C: 82.5 ml LV SV MOD A2C:     62.0 ml LV SV MOD A4C:     129.0 ml LV SV MOD BP:      52.2 ml RIGHT VENTRICLE RV S prime:     7.40 cm/s TAPSE (M-mode): 1.4 cm LEFT ATRIUM             Index LA diam:        3.10 cm 1.75 cm/m LA Vol (A2C):   37.3 ml 21.08 ml/m LA Vol (A4C):   27.1 ml 15.32 ml/m LA Biplane Vol: 31.6 ml 17.86 ml/m  AORTIC VALVE AV Area (Vmax):    3.15 cm AV Area (Vmean):   3.06 cm AV Area (VTI):     2.91 cm AV Vmax:  101.00 cm/s AV Vmean:          65.800 cm/s AV VTI:            0.192 m AV Peak Grad:      4.1 mmHg AV Mean Grad:      2.0 mmHg LVOT Vmax:         83.80 cm/s LVOT Vmean:        52.900 cm/s LVOT VTI:          0.147 m LVOT/AV VTI ratio: 0.77  SHUNTS Systemic VTI:  0.15 m Systemic Diam: 2.20 cm Gaylyn Keas MD Electronically signed by Gaylyn Keas MD Signature Date/Time: 01/28/2024/10:01:41 AM    Final    CT Angio Chest/Abd/Pel for Dissection W and/or Wo Contrast Result Date: 01/26/2024 CLINICAL DATA:  Sharp chest pain since this morning, abdominal pain, nausea and vomiting for 4 days, pain radiating from upper abdomen to back EXAM: CT ANGIOGRAPHY CHEST, ABDOMEN AND PELVIS TECHNIQUE: Non-contrast CT of the chest was initially obtained. Multidetector CT imaging through the chest, abdomen and pelvis was  performed using the standard protocol during bolus administration of intravenous contrast. Multiplanar reconstructed images and MIPs were obtained and reviewed to evaluate the vascular anatomy. RADIATION DOSE REDUCTION: This exam was performed according to the departmental dose-optimization program which includes automated exposure control, adjustment of the mA and/or kV according to patient size and/or use of iterative reconstruction technique. CONTRAST:  75mL OMNIPAQUE  IOHEXOL  350 MG/ML SOLN COMPARISON:  01/26/2024 FINDINGS: CTA CHEST FINDINGS Cardiovascular: No evidence of thoracic aortic aneurysm or dissection. Great vessels are patent. Diffuse atherosclerosis of the thoracic aorta. Prominent left ventricular dilatation. No pericardial effusion. Dense atherosclerosis throughout the coronary vasculature. There is technically adequate opacification of the pulmonary vasculature. No filling defects or pulmonary emboli. Mediastinum/Nodes: No significant findings within the thyroid , trachea, or esophagus. No pathologic adenopathy. Lungs/Pleura: Severe upper lobe predominant emphysema. No acute airspace disease, effusion, or pneumothorax. Mild bilateral bronchial wall thickening, greatest within the lower lobes, consistent with bronchitis or reactive airway disease. There is a 4 mm right upper lobe pulmonary nodule reference image 76/7, and a 3 mm right upper lobe pulmonary nodule reference image 70/7. Musculoskeletal: No acute or destructive bony abnormalities. Reconstructed images demonstrate no additional findings. Review of the MIP images confirms the above findings. CTA ABDOMEN AND PELVIS FINDINGS VASCULAR Aorta: Normal caliber aorta without aneurysm, dissection, vasculitis or significant stenosis. Diffuse atherosclerosis. Celiac: Patent without evidence of aneurysm, dissection, vasculitis or significant stenosis. SMA: There is moderate atherosclerosis within the proximal SMA, with estimated 50% stenosis  proximally 3.5 cm from the origin. No aneurysm, dissection, or vasculitis. Renals: Both renal arteries are patent without evidence of aneurysm, dissection, vasculitis, fibromuscular dysplasia or significant stenosis. Mild atherosclerosis. IMA: Patent without evidence of aneurysm, dissection, vasculitis or significant stenosis. Inflow: There is extensive atherosclerosis at the aortic bifurcation and bilateral common iliac arteries. On the left, there is estimated 70-90% stenosis at the origin of the left common iliac artery. Stenosis on the right estimated approaching 50%. 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 Diffuse atherosclerosis of the common femoral artery, with focal high-grade stenosis of the proximal left superficial femoral artery estimated 70-90%. There is extensive atherosclerosis throughout the right common iliac external iliac, and internal iliac arteries, with long segment stenosis of the external iliac artery estimated 50-70%. Diffuse atherosclerosis of the right common femoral and  superficial femoral arteries without focal high-grade stenosis. Veins: No obvious venous abnormality within the limitations of this arterial phase study. Review of the MIP images confirms the above findings. NON-VASCULAR Hepatobiliary: 1.5 cm right lobe liver cyst. Otherwise no focal liver abnormalities or biliary duct dilation. The gallbladder is decompressed, limiting its evaluation. Pancreas: Unremarkable. No pancreatic ductal dilatation or surrounding inflammatory changes. Spleen: Normal in size without focal abnormality. Adrenals/Urinary Tract: Left renal cortical cyst does not require specific imaging follow-up. Otherwise the kidneys enhance normally. No urinary tract calculi or obstructive uropathy. The adrenals and bladder appear unremarkable. Stomach/Bowel: There is  segmental circumferential mural thickening within the mid sigmoid colon, extending approximately 5.7 cm in length reference image 269/6. There is an associated contained perforation measuring 6.1 x 4.0 x 6.0 cm, reference image 258/6. Overall, findings are consistent with perforated sigmoid diverticulitis with associated diverticular abscess. No bowel obstruction or ileus. Normal appendix right mid abdomen. Moderate retained stool throughout the colon. Lymphatic: No pathologic adenopathy within the abdomen or pelvis. Reproductive: Prostate is unremarkable. Other: No free fluid or free intraperitoneal gas. Small fat containing bilateral inguinal hernias. No bowel herniation. Musculoskeletal: No acute or destructive bony abnormalities. Prior L4-5 discectomy and posterior fusion. Reconstructed images demonstrate no additional findings. Review of the MIP images confirms the above findings. IMPRESSION: Vascular: 1. No evidence of thoracoabdominal aortic aneurysm or dissection. 2. No evidence of pulmonary embolus. 3. Aortic Atherosclerosis (ICD10-I70.0). Coronary artery atherosclerosis. 4. 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 5. 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 6. Extensive atherosclerosis of the right iliac system, with stenosis estimated at 50-70% throughout the right external iliac artery. Nonvascular: 1. Segmental concentric mural thickening of the mid sigmoid colon, with associated contained perforation. Findings are compatible with perforated diverticulitis and diverticular abscess, though close follow-up is recommended to document resolution and exclude underlying neoplasm. 2. Bilateral bronchial wall thickening, greatest in the lower lobes, consistent with bronchitis or reactive airway disease. No acute airspace disease. 3.   Emphysema (ICD10-J43.9). 4. Moderate retained stool throughout the colon compatible with constipation. No obstruction or ileus. 5. Small bilateral fat containing inguinal hernias. 6. Multiple pulmonary nodules. Most significant: Right solid pulmonary nodule within the upper lobe measuring 4 mm. Per Fleischner Society Guidelines,if patient is low risk for malignancy, no routine follow-up imaging is recommended. If patient is high risk for malignancy, a non-contrast Chest CT at 12 months is optional. If performed and the nodule is stable at 12 months, no further follow-up is recommended. These guidelines do not apply to immunocompromised patients and patients with cancer. Follow up in patients with significant comorbidities as clinically warranted. For lung cancer screening, adhere to Lung-RADS guidelines. Reference: Radiology. 2017; 284(1):228-43. Critical Value/emergent results were called by telephone at the time of interpretation on 01/26/2024 at 3:09 pm to provider [facility] , who verbally acknowledged these results. Electronically Signed   By: Bobbye Burrow M.D.   On: 01/26/2024 15:14   DG Chest 2 View Result Date: 01/26/2024 CLINICAL DATA:  Chest pain EXAM: CHEST - 2 VIEW COMPARISON:  Chest radiograph dated 07/13/2021 FINDINGS: Normal lung volumes. No focal consolidations. No pleural effusion or pneumothorax. The heart size and mediastinal contours are within normal limits. Cervical spinal fixation hardware appears intact. IMPRESSION: No active cardiopulmonary disease. Electronically Signed   By: Limin  Xu M.D.   On: 01/26/2024  13:58    Scheduled Meds:  aspirin   81 mg Oral Daily   losartan   50 mg Oral Daily   rosuvastatin   20 mg Oral Daily   sodium chloride  flush  3 mL Intravenous Q12H   Continuous Infusions:  piperacillin-tazobactam (ZOSYN)  IV 3.375 g (01/28/24 0443)     LOS: 2 days    Time spent: 50 min  [name] Lew, MD  01/28/2024, 10:59 AM

## 2024-01-28 NOTE — Progress Notes (Signed)
 PHARMACY - ANTICOAGULATION  Pharmacy Consult for heparin  Indication: High risk for LV thrombus  Allergies  Allergen Reactions   Carvedilol  Diarrhea   Lipitor [Atorvastatin] Other (See Comments)    "Makes pt feel funky"   Plavix  [Clopidogrel ] Rash    Patient Measurements: Height: 5' 6.5" (168.9 cm) Weight: 67.1 kg (148 lb) IBW/kg (Calculated) : 64.95 HEPARIN  DW (KG): 67.1  Vital Signs: Temp: 98.3 F (36.8 C) (05/15 2049) Temp Source: Oral (05/15 2049) BP: 101/68 (05/15 2049) Pulse Rate: 80 (05/15 2049)  Labs: Recent Labs    01/26/24 1247 01/26/24 1447 01/27/24 0641 01/28/24 0735 01/28/24 2303  HGB 14.3  --  13.7 13.1  --   HCT 42.4  --  41.2 37.4*  --   PLT 242  --  275 263  --   HEPARINUNFRC  --   --   --   --  <0.10*  CREATININE 0.74  --  0.76 0.75  --   TROPONINIHS 13 13  --   --   --     Estimated Creatinine Clearance: 70 mL/min (by C-G formula based on SCr of 0.75 mg/dL).   Assessment: 78 y.o. male with possible early forming LV thrombus on ECHO for heparin   Goal of Therapy:  Heparin  level 0.3-0.7 units/ml Monitor platelets by anticoagulation protocol: Yes   Plan:  Heparin  2000 units IV bolus, then increase heparin  1150 units/hr Follow-up am labs.   Claudine Cullens, PharmD, BCPS .

## 2024-01-28 NOTE — Plan of Care (Signed)

## 2024-01-28 NOTE — Progress Notes (Addendum)
 Presents with abdominal pain. Found to have diverticulitis with diverticular abscess and contained perforation on CT.      01/28/24 1246  TOC Brief Assessment  Insurance and Status Reviewed  Patient has primary care physician Yes  Home environment has been reviewed From home alone. Supportive son.  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

## 2024-01-28 NOTE — Progress Notes (Addendum)
 Patient Name: Bee Hefel. Date of Encounter: 01/28/2024 [facility] Cardiologist: Ahmad Alert, MD   Interval Summary  .    No abd pain, had brief episode of light-headedness while sitting on the side of the bed.   Vital Signs .    Vitals:   01/27/24 1454 01/27/24 1952 01/28/24 0440 01/28/24 0748  BP: 125/69 126/63 114/64 112/74  Pulse: 76 77 76 71  Resp: 16  17 17   Temp: 98.2 F (36.8 C) 98.1 F (36.7 C) 98 F (36.7 C) 98 F (36.7 C)  TempSrc:  Oral Oral   SpO2: 96% 95% 96% 94%  Weight:      Height:        Intake/Output Summary (Last 24 hours) at 01/28/2024 1143 Last data filed at 01/28/2024 0900 Gross per 24 hour  Intake 466.49 ml  Output --  Net 466.49 ml      01/26/2024   12:45 PM 01/08/2024    9:52 AM 09/17/2023    1:18 PM  Last 3 Weights  Weight (lbs) 148 lb 138 lb 12.8 oz 142 lb  Weight (kg) 67.132 kg 62.959 kg 64.411 kg      Telemetry/ECG    Sinus Rhythm, PVCs, brief episodes of NSVT - Personally Reviewed  Physical Exam .    GEN: No acute distress.   Neck: No JVD Cardiac: RRR, no murmurs, rubs, or gallops.  Respiratory: Clear to auscultation bilaterally. GI: Soft, nontender, non-distended  MS: No edema  Assessment & Plan .     Jaivien Merlos. is a 78 y.o. male with a hx of CAD s/p DES to RCA '02, DES to LAD, and overlapping DES to RCA '22, HFrEF/ICM, HTN, HLD who is being seen 01/27/2024 for the evaluation of preop evaluation at the request of Dr. Alla Isaacs.   Pre op evaluation Sigmoid diverticulitis with perforation and abscess CAD prior stenting to RCA/LAD -- presented with several days of abd pain and n/v. Found to have diverticulitis w/perforation and abscess with elevated WBC -- seen by gen surgery, turned down for perc drain via IR. Currently being managed conservatively but cardiology asked to evaluate in the event surgery is needed -- in regards to his cardiac hx, he has been well with no anginal symptoms or dyspnea  PTA -- known LVEF of 25-30%, but no active HF symptoms this admission -- RCRI 6.6%, given his lack of symptoms prior to admission would not anticipate ischemic evaluation at this time  Chronic HFrEF/ICM Possible early forming LV thrombus PVCs/NSVT -- echo showed LVEF of 25-30% (unchanged), swirling of definity contrast in the apex with no obvious LV thrombus. Findings c/w sluggish flow in the apex. Cannot rule out early forming thrombus.  -- start IV heparin  until clear he will not need surgery with eventual plans for Eliquis -- GDMT: has been limited, agreeable to start Toprol  XL 12.5mg  daily (had issues with polypharmacy in the past), losartan  50mg  daily    PAD -- incidentally found on CT this admission, does report claudication symptoms PTA -- on ASA, statin -- will need outpatient VVS consult   HTN -- controlled -- continue losartan    HLD -- on statin  For questions or updates, please contact [facility] Please consult [email or web address] for contact info under        Signed, Johnie Nailer, NP   ATTENDING ATTESTATION:  After conducting a review of all available clinical information with the care team, interviewing the patient, and performing a physical exam,  I agree with the findings and plan described in this note.   GEN: No acute distress.   HEENT:  MMM, no JVD, no scleral icterus Cardiac: RRR, no murmurs, rubs, or gallops.  Respiratory: Clear to auscultation bilaterally. GI: Soft, nontender, non-distended, sparse BS MS: No edema; No deformity. Neuro:  Nonfocal  Vasc:  +2 radial pulses  Patient remains stable and diet is being advanced.  TTE with severely reduced EF (longstanding) and sluggish flow with high risk for LV thrombus formation.  Start IV heparin  today and will plan on A/C at discharge.  Continue losartan  50, start Toprol .  Patient remains euvolemic.  Ok to proceed to surgery if needed given lack of HF symptoms or angina.   Alyssa Backbone,  MD Pager [phone]

## 2024-01-29 ENCOUNTER — Inpatient Hospital Stay (HOSPITAL_COMMUNITY)

## 2024-01-29 DIAGNOSIS — K5792 Diverticulitis of intestine, part unspecified, without perforation or abscess without bleeding: Secondary | ICD-10-CM | POA: Diagnosis not present

## 2024-01-29 LAB — CBC
HCT: 39.7 % (ref 39.0–52.0)
Hemoglobin: 13.4 g/dL (ref 13.0–17.0)
MCH: 28.9 pg (ref 26.0–34.0)
MCHC: 33.8 g/dL (ref 30.0–36.0)
MCV: 85.6 fL (ref 80.0–100.0)
Platelets: 318 10*3/uL (ref 150–400)
RBC: 4.64 MIL/uL (ref 4.22–5.81)
RDW: 13.2 % (ref 11.5–15.5)
WBC: 14.7 10*3/uL — ABNORMAL HIGH (ref 4.0–10.5)
nRBC: 0 % (ref 0.0–0.2)

## 2024-01-29 LAB — HEPARIN LEVEL (UNFRACTIONATED)
Heparin Unfractionated: <0.1 [IU]/mL — ABNORMAL LOW (ref 0.30–0.70)
Heparin Unfractionated: 0.23 [IU]/mL — ABNORMAL LOW (ref 0.30–0.70)

## 2024-01-29 MED ORDER — IOHEXOL 350 MG/ML SOLN
75.0000 mL | Freq: Once | INTRAVENOUS | Status: AC | PRN
  Administered 2024-01-29: 75 mL via INTRAVENOUS

## 2024-01-29 MED ORDER — HEPARIN BOLUS VIA INFUSION
2000.0000 [IU] | Freq: Once | INTRAVENOUS | Status: AC
  Administered 2024-01-29: 2000 [IU] via INTRAVENOUS
  Filled 2024-01-29: qty 2000

## 2024-01-29 MED ORDER — HEPARIN (PORCINE) 25000 UT/250ML-% IV SOLN
1550.0000 [IU]/h | INTRAVENOUS | Status: DC
  Administered 2024-01-30: 1550 [IU]/h via INTRAVENOUS
  Administered 2024-01-30: 1450 [IU]/h via INTRAVENOUS
  Administered 2024-01-31: 1550 [IU]/h via INTRAVENOUS
  Filled 2024-01-29 (×3): qty 250

## 2024-01-29 MED ORDER — HEPARIN BOLUS VIA INFUSION
1000.0000 [IU] | Freq: Once | INTRAVENOUS | Status: AC
  Administered 2024-01-29: 1000 [IU] via INTRAVENOUS
  Filled 2024-01-29: qty 1000

## 2024-01-29 MED ORDER — IOHEXOL 9 MG/ML PO SOLN
500.0000 mL | ORAL | Status: AC
Start: 2024-01-29 — End: 2024-01-29
  Administered 2024-01-29 (×2): 500 mL via ORAL

## 2024-01-29 NOTE — Progress Notes (Signed)
 PHARMACY - ANTICOAGULATION CONSULT NOTE  Pharmacy Consult for heparin  Indication: High risk for LV thrombus  Allergies  Allergen Reactions   Carvedilol  Diarrhea   Lipitor [Atorvastatin] Other (See Comments)    "Makes pt feel funky"   Plavix  [Clopidogrel ] Rash    Patient Measurements: Height: 5' 6.5" (168.9 cm) Weight: 67.1 kg (148 lb) IBW/kg (Calculated) : 64.95 HEPARIN  DW (KG): 67.1  Vital Signs: Temp: 97.7 F (36.5 C) (05/16 0748) Temp Source: Oral (05/16 0748) BP: 124/63 (05/16 1100) Pulse Rate: 75 (05/16 1100)  Labs: Recent Labs    01/26/24 1247 01/26/24 1447 01/27/24 0641 01/28/24 0735 01/28/24 2303 01/29/24 1032  HGB 14.3  --  13.7 13.1  --  13.4  HCT 42.4  --  41.2 37.4*  --  39.7  PLT 242  --  275 263  --  318  HEPARINUNFRC  --   --   --   --  <0.10* <0.10*  CREATININE 0.74  --  0.76 0.75  --   --   TROPONINIHS 13 13  --   --   --   --     Estimated Creatinine Clearance: 70 mL/min (by C-G formula based on SCr of 0.75 mg/dL).   Medical History: Past Medical History:  Diagnosis Date   Coronary artery disease    Erectile dysfunction    HFrEF (heart failure with reduced ejection fraction) (HCC)    Echocardiogram 07/14/21:  EF 25, global HK, inf HK worse, Gr 2 DD, normal RVSF, mild to mod MR, AV sclerosis w/o AS Echocardiogram 2/23: EF 25-30, global HK, GLS -12.9, normal RVSF, mild to mod MR, AV sclerosis w/o AS    History of hiatal hernia    a lone time ago   Hyperlipidemia    [place] fracture [facility]) 03/22/2020   Myocardial infarction (HCC)    x 2   Polio     Medications:  Medications Prior to Admission  Medication Sig Dispense Refill Last Dose/Taking   albuterol  (VENTOLIN  HFA) 108 (90 Base) MCG/ACT inhaler Inhale 2 puffs into the lungs every 6 (six) hours as needed for wheezing (cough). 8 g 2 Unknown   aspirin  81 MG chewable tablet Chew 81 mg by mouth daily.   01/26/2024 Morning   cholecalciferol  (VITAMIN D3) 25 MCG (1000 UNIT) tablet Take 1,000  Units by mouth daily.    01/25/2024   ketoconazole (NIZORAL) 2 % cream Apply 1 Application topically 2 (two) times daily.   01/25/2024   losartan  (COZAAR ) 50 MG tablet Take 1 tablet (50 mg total) by mouth daily. 30 tablet 3 01/25/2024   nitroGLYCERIN  (NITROSTAT ) 0.4 MG SL tablet Place 1 tablet (0.4 mg total) under the tongue every 5 (five) minutes as needed for chest pain. 10 tablet 2 Unknown   rosuvastatin  (CRESTOR ) 20 MG tablet Take 1 tablet (20 mg total) by mouth daily. 90 tablet 3 01/25/2024   augmented betamethasone dipropionate (DIPROLENE-AF) 0.05 % cream Apply 1 Application topically. (Patient not taking: Reported on 01/26/2024)   Not Taking   fluticasone  (FLONASE ) 50 MCG/ACT nasal spray Place 2 sprays into both nostrils daily. (Patient not taking: Reported on 01/26/2024) 16 g 6 Not Taking   Scheduled:   feeding supplement  1 Container Oral TID BM   iohexol   500 mL Oral Q1H   losartan   50 mg Oral Daily   metoprolol  succinate  12.5 mg Oral Daily   rosuvastatin   20 mg Oral Daily   sodium chloride  flush  3 mL Intravenous Q12H  Assessment: 78 yo male with sigmoid diverticulitis with perforation and abscess. Echo results today show high risk LV thrombus and pharmacy consulted to dose heparin . No anticoagulants noted PTA.   -heparin  level remains < 0.1 after heparin  bolus and increase -Per chart notes, he may need surgery  Goal of Therapy:  Heparin  level 0.3-0.7 units/ml Monitor platelets by anticoagulation protocol: Yes   Plan:  -heparin  bolus 2000 units x1 then increase to 1350 units/hr -Heparin  level in 8 hrs  Baxter Limber, PharmD Clinical Pharmacist **Pharmacist phone directory can now be found on [email or web address] ([email or web address]).  Listed under [facility].

## 2024-01-29 NOTE — Progress Notes (Signed)
 PHARMACY - ANTICOAGULATION CONSULT NOTE  Pharmacy Consult for heparin  Indication: High risk for LV thrombus  Allergies  Allergen Reactions   Carvedilol  Diarrhea   Lipitor [Atorvastatin] Other (See Comments)    "Makes pt feel funky"   Plavix  [Clopidogrel ] Rash    Patient Measurements: Height: 5' 6.5" (168.9 cm) Weight: 67.1 kg (148 lb) IBW/kg (Calculated) : 64.95 HEPARIN  DW (KG): 67.1  Vital Signs: Temp: 97.7 F (36.5 C) (05/16 1956) BP: 105/80 (05/16 1956) Pulse Rate: 73 (05/16 1956)  Labs: Recent Labs    01/27/24 0641 01/28/24 0735 01/28/24 2303 01/29/24 1032 01/29/24 2144  HGB 13.7 13.1  --  13.4  --   HCT 41.2 37.4*  --  39.7  --   PLT 275 263  --  318  --   HEPARINUNFRC  --   --  <0.10* <0.10* 0.23*  CREATININE 0.76 0.75  --   --   --     Estimated Creatinine Clearance: 70 mL/min (by C-G formula based on SCr of 0.75 mg/dL).   Medical History: Past Medical History:  Diagnosis Date   Coronary artery disease    Erectile dysfunction    HFrEF (heart failure with reduced ejection fraction) (HCC)    Echocardiogram 07/14/21:  EF 25, global HK, inf HK worse, Gr 2 DD, normal RVSF, mild to mod MR, AV sclerosis w/o AS Echocardiogram 2/23: EF 25-30, global HK, GLS -12.9, normal RVSF, mild to mod MR, AV sclerosis w/o AS    History of hiatal hernia    a lone time ago   Hyperlipidemia    [place] fracture [facility]) 03/22/2020   Myocardial infarction (HCC)    x 2   Polio       Assessment: 78 yo male with sigmoid diverticulitis with perforation and abscess. Echo results today show high risk LV thrombus and pharmacy consulted to dose heparin . No anticoagulants noted PTA.   HL 0.23 - subtherapeutic   Goal of Therapy:  Heparin  level 0.3-0.7 units/ml Monitor platelets by anticoagulation protocol: Yes   Plan:  -heparin  bolus 1000 units x1 then increase to 1450 units/hr -Heparin  level in AM Daily HL , CBC F/u surgical plans as applicable  Oralee Billow, PharmD,  BCCCP Clinical Pharmacist 01/29/2024 10:11 PM

## 2024-01-29 NOTE — Progress Notes (Addendum)
 Patient Name: [name] [name]. Date of Encounter: 01/29/2024 [facility] Cardiologist: Ahmad Alert, MD   Interval Summary  .    Feels well this morning, diet being advanced  Vital Signs .    Vitals:   01/28/24 1530 01/28/24 2049 01/29/24 0527 01/29/24 0748  BP: 125/61 101/68 (!) 106/59 115/76  Pulse: 71 80 73 76  Resp: 16 18 17    Temp: 97.8 F (36.6 C) 98.3 F (36.8 C) 97.6 F (36.4 C) 97.7 F (36.5 C)  TempSrc:  Oral  Oral  SpO2: 98% 94% 95% 98%  Weight:      Height:        Intake/Output Summary (Last 24 hours) at 01/29/2024 1005 Last data filed at 01/28/2024 1800 Gross per 24 hour  Intake 153.64 ml  Output --  Net 153.64 ml      01/26/2024   12:45 PM 01/08/2024    9:52 AM 09/17/2023    1:18 PM  Last 3 Weights  Weight (lbs) 148 lb 138 lb 12.8 oz 142 lb  Weight (kg) 67.132 kg 62.959 kg 64.411 kg      Telemetry/ECG    Sinus Rhythm, PVCs (fewer) - Personally Reviewed  Physical Exam .    GEN: No acute distress.   Neck: No JVD Cardiac: RRR, no murmurs, rubs, or gallops.  Respiratory: Clear to auscultation bilaterally. GI: Soft, nontender, non-distended  MS: No edema  Assessment & Plan .     [name] [name]. is a 78 y.o. male with a hx of CAD s/p DES to RCA '02, DES to LAD, and overlapping DES to RCA '22, HFrEF/ICM, HTN, HLD who is being seen 01/27/2024 for the evaluation of preop evaluation at the request of Dr. Alla Isaacs.   Pre op evaluation Sigmoid diverticulitis with perforation and abscess CAD prior stenting to RCA/LAD -- presented with several days of abd pain and n/v. Found to have diverticulitis w/perforation and abscess with elevated WBC -- seen by gen surgery, turned down for perc drain via IR. Currently being managed conservatively but cardiology asked to evaluate in the event surgery is needed -- in regards to his cardiac hx, he has been well with no anginal symptoms or dyspnea PTA -- known LVEF of 25-30%, but no active HF symptoms  this admission -- RCRI 6.6%, given his lack of symptoms prior to admission would not anticipate ischemic evaluation at this time   Chronic HFrEF/ICM Possible early forming LV thrombus PVCs/NSVT -- echo showed LVEF of 25-30% (unchanged), swirling of definity contrast in the apex with no obvious LV thrombus. Findings c/w sluggish flow in the apex. Cannot rule out early forming thrombus.  -- on IV heparin  until clear he will not need surgery with eventual plans for Eliquis 5mg  BID (ok to start once appropriate) -- GDMT: has been limited, continueToprol XL 12.5mg  daily (had issues with polypharmacy in the past), losartan  50mg  daily    PAD -- incidentally found on CT this admission, does report claudication symptoms PTA -- on ASA, statin -- will need outpatient VVS consult   HTN -- controlled -- continue losartan    HLD -- on statin  Will arrange for outpatient follow up in the office.   For questions or updates, please contact [facility] Please consult [email or web address] for contact info under      Signed, [name] Nailer, NP    ATTENDING ATTESTATION:  After conducting a review of all available clinical information with the care team, interviewing the patient, and performing  a physical exam, I agree with the findings and plan described in this note.   GEN: No acute distress.   HEENT:  MMM, no JVD, no scleral icterus Cardiac: RRR, no murmurs, rubs, or gallops.  Respiratory: Clear to auscultation bilaterally. GI: Soft, nontender, non-distended  MS: No edema; No deformity. Neuro:  Nonfocal  Vasc:  +2 radial pulses  Patient remains well without any clinical decompensation.  Will d/c ASA given need for A/C at discharge given sluggish LV flow.  Continue IV heparin  for now.  OTW, continue Toprol  XL 12.5.  Will sign off, call with ?s.  Alyssa Backbone, MD Pager [phone]

## 2024-01-29 NOTE — Progress Notes (Signed)
 Subjective: CC: Reports abdominal pain has resolved. Now just having back pain that started at the same time as his abdominal pain. Reports this is what he is still taking pain medication for. Tolerating fld without n/v. Passing flatus. BM in the last 24 hours.   Afebrile. No tachycardia or hypotension. WBC pending.   Objective: Vital signs in last 24 hours: Temp:  [97.6 F (36.4 C)-98.3 F (36.8 C)] 97.7 F (36.5 C) (05/16 0748) Pulse Rate:  [71-82] 76 (05/16 0748) Resp:  [16-18] 17 (05/16 0527) BP: (101-125)/(59-76) 115/76 (05/16 0748) SpO2:  [94 %-98 %] 98 % (05/16 0748) Last BM Date : 01/28/24  Intake/Output from previous day: 05/15 0701 - 05/16 0700 In: 513.6 [P.O.:360; I.V.:59.7; IV Piggyback:94] Out: -  Intake/Output this shift: No intake/output data recorded.  PE: Gen:  Alert, NAD, pleasant Abd: Soft, ND, NT, +BS   Lab Results:  Recent Labs    01/27/24 0641 01/28/24 0735  WBC 14.9* 14.1*  HGB 13.7 13.1  HCT 41.2 37.4*  PLT 275 263   BMET Recent Labs    01/27/24 0641 01/28/24 0735  NA 130* 133*  K 4.0 4.1  CL 100 101  CO2 18* 22  GLUCOSE 71 91  BUN 17 16  CREATININE 0.76 0.75  CALCIUM  8.5* 8.2*   PT/INR No results for input(s): "LABPROT", "INR" in the last 72 hours. CMP     Component Value Date/Time   NA 133 (L) 01/28/2024 0735   NA 136 01/08/2024 1025   K 4.1 01/28/2024 0735   CL 101 01/28/2024 0735   CO2 22 01/28/2024 0735   GLUCOSE 91 01/28/2024 0735   BUN 16 01/28/2024 0735   BUN 11 01/08/2024 1025   CREATININE 0.75 01/28/2024 0735   CREATININE 0.93 03/29/2013 0923   CALCIUM  8.2 (L) 01/28/2024 0735   PROT 5.5 (L) 01/28/2024 0735   PROT 6.2 01/08/2024 1025   ALBUMIN 2.5 (L) 01/28/2024 0735   ALBUMIN 3.9 01/08/2024 1025   AST 17 01/28/2024 0735   ALT 15 01/28/2024 0735   ALKPHOS 56 01/28/2024 0735   BILITOT 1.2 01/28/2024 0735   BILITOT 0.8 01/08/2024 1025   GFRNONAA >60 01/28/2024 0735   GFRNONAA 85 03/29/2013 0923    GFRAA 104 05/01/2020 1031   GFRAA >89 03/29/2013 0923   Lipase     Component Value Date/Time   LIPASE 28 01/26/2024 1247    Studies/Results: ECHOCARDIOGRAM COMPLETE Result Date: 01/28/2024    ECHOCARDIOGRAM REPORT   Patient Name:   [name] [name]. Date of Exam: 01/28/2024 Medical Rec #:  [id number]           Height:       66.5 in Accession #:    [id number]          Weight:       148.0 lb Date of Birth:  March 23, 1946           BSA:          1.769 m Patient Age:    78 years            BP:           114/64 mmHg Patient Gender: M                   HR:           72 bpm. Exam Location:  Inpatient Procedure: 2D Echo, Cardiac Doppler and Color Doppler (Both Spectral and Color  Flow Doppler were utilized during procedure). Indications:    Z01.818 Encounter for other preprocedural examination  History:        Patient has prior history of Echocardiogram examinations, most                 recent 02/11/2022.  Sonographer:    Andrena Bang Referring Phys: [phone] LINDSAY B ROBERTS IMPRESSIONS  1. There is swirling of definity contrast in the apex with no obvious LV thrombus. Findings c/w sluggish flow in the apex. Cannot rule out early forming thrombus. . Left ventricular ejection fraction, by estimation, is 25 to 30%. The left ventricle has severely decreased function. The left ventricle demonstrates global hypokinesis. There is mild left ventricular hypertrophy of the infero-lateral segment. Left ventricular diastolic function could not be evaluated.  2. Right ventricular systolic function is mildly reduced. The right ventricular size is normal. Tricuspid regurgitation signal is inadequate for assessing PA pressure.  3. The mitral valve is degenerative. Trivial mitral valve regurgitation. No evidence of mitral stenosis.  4. The aortic valve is tricuspid. Aortic valve regurgitation is not visualized. Aortic valve sclerosis/calcification is present, without any evidence of aortic stenosis. Aortic valve area, by VTI  measures 2.91 cm. Aortic valve mean gradient measures 2.0 mmHg. Aortic valve Vmax measures 1.01 m/s.  5. The inferior vena cava is normal in size with greater than 50% respiratory variability, suggesting right atrial pressure of 3 mmHg. FINDINGS  Left Ventricle: There is swirling of definity contrast in the apex with no obvious LV thrombus. Findings c/w sluggish flow in the apex. Cannot rule out early forming thrombus. Left ventricular ejection fraction, by estimation, is 25 to 30%. The left ventricle has severely decreased function. The left ventricle demonstrates global hypokinesis. Definity contrast agent was given IV to delineate the left ventricular endocardial borders. The left ventricular internal cavity size was normal in size. There  is mild left ventricular hypertrophy of the infero-lateral segment. Abnormal (paradoxical) septal motion, consistent with left bundle branch block. Left ventricular diastolic function could not be evaluated. Right Ventricle: The right ventricular size is normal. No increase in right ventricular wall thickness. Right ventricular systolic function is mildly reduced. Tricuspid regurgitation signal is inadequate for assessing PA pressure. Left Atrium: Left atrial size was normal in size. Right Atrium: Right atrial size was normal in size. Pericardium: There is no evidence of pericardial effusion. Mitral Valve: The mitral valve is degenerative in appearance. There is mild thickening of the mitral valve leaflet(s). There is mild calcification of the mitral valve leaflet(s). Trivial mitral valve regurgitation. No evidence of mitral valve stenosis. Tricuspid Valve: The tricuspid valve is normal in structure. Tricuspid valve regurgitation is trivial. No evidence of tricuspid stenosis. Aortic Valve: The aortic valve is tricuspid. Aortic valve regurgitation is not visualized. Aortic valve sclerosis/calcification is present, without any evidence of aortic stenosis. Aortic valve mean  gradient measures 2.0 mmHg. Aortic valve peak gradient measures 4.1 mmHg. Aortic valve area, by VTI measures 2.91 cm. Pulmonic Valve: The pulmonic valve was normal in structure. Pulmonic valve regurgitation is not visualized. No evidence of pulmonic stenosis. Aorta: The aortic root is normal in size and structure. Venous: The inferior vena cava is normal in size with greater than 50% respiratory variability, suggesting right atrial pressure of 3 mmHg. IAS/Shunts: No atrial level shunt detected by color flow Doppler.  LEFT VENTRICLE PLAX 2D LVIDd:         5.00 cm      Diastology LVIDs:  4.30 cm      LV e' lateral: 12.80 cm/s LV PW:         1.40 cm LV IVS:        0.80 cm LVOT diam:     2.20 cm LV SV:         56 LV SV Index:   32 LVOT Area:     3.80 cm  LV Volumes (MOD) LV vol d, MOD A2C: 138.0 ml LV vol d, MOD A4C: 129.0 ml LV vol s, MOD A2C: 76.0 ml LV vol s, MOD A4C: 82.5 ml LV SV MOD A2C:     62.0 ml LV SV MOD A4C:     129.0 ml LV SV MOD BP:      52.2 ml RIGHT VENTRICLE RV S prime:     7.40 cm/s TAPSE (M-mode): 1.4 cm LEFT ATRIUM             Index LA diam:        3.10 cm 1.75 cm/m LA Vol (A2C):   37.3 ml 21.08 ml/m LA Vol (A4C):   27.1 ml 15.32 ml/m LA Biplane Vol: 31.6 ml 17.86 ml/m  AORTIC VALVE AV Area (Vmax):    3.15 cm AV Area (Vmean):   3.06 cm AV Area (VTI):     2.91 cm AV Vmax:           101.00 cm/s AV Vmean:          65.800 cm/s AV VTI:            0.192 m AV Peak Grad:      4.1 mmHg AV Mean Grad:      2.0 mmHg LVOT Vmax:         83.80 cm/s LVOT Vmean:        52.900 cm/s LVOT VTI:          0.147 m LVOT/AV VTI ratio: 0.77  SHUNTS Systemic VTI:  0.15 m Systemic Diam: 2.20 cm Gaylyn Keas MD Electronically signed by Gaylyn Keas MD Signature Date/Time: 01/28/2024/10:01:41 AM    Final     Anti-infectives: Anti-infectives (From admission, onward)    Start     Dose/Rate Route Frequency Ordered Stop   01/26/24 2200  piperacillin-tazobactam (ZOSYN) IVPB 3.375 g       Placed in "Followed by"  Linked Group   3.375 g 12.5 mL/hr over 240 Minutes Intravenous Every 8 hours 01/26/24 1518     01/26/24 1530  piperacillin-tazobactam (ZOSYN) IVPB 3.375 g       Placed in "Followed by" Linked Group   3.375 g 100 mL/hr over 30 Minutes Intravenous  Once 01/26/24 1518 01/26/24 1621        Assessment/Plan Sigmoid diverticulitis with perforation and abscess  - CTA abdomen pelvis shows circumferential wall thickening of the mid sigmoid colon with contained perforation (6.1 x 4.0 x 6.0 cm).  - IR note 5/13 reports there is no window for percutaneous drain placement.  - HDS without fever, tachycardia or hypotension. WBC downtrending on last check - pending this am. No peritonitis on exam. No current indication for emergency surgery - Cont IV abx - Adv to soft diet - Although clinically improving, given findings of initial scan would like to repeat CT today to see if improving vs fluid collection has become amenable to IR drainage.  - If patient fails to improve they may require surgical intervention resulting in a colectomy/colostomy.  This was discussed with the patient. Cardiology has seen and obtained echo  w/ findings of EF 25-30% and possible LV thrombus formation. They are starting anticoagulation but feel it is okay to proceed to surgery if needed given lack of HF symptoms or angina. - If patient improves with conservative therapies would recommend colonoscopy in ~6-8 weeks.   - We will follow with you  FEN - Soft diet, IVF per primary  VTE - SCDs, heparin  gtt ID - Zosyn  LV thrombus? PAD HFrEF CAD HLD HTN Remote H/O polio   I reviewed nursing notes, hospitalist notes, last 24 h vitals and pain scores, last 48 h intake and output, last 24 h labs and trends, and last 24 h imaging results.   LOS: 3 days    Delton Filbert, [facility] Surgery 01/29/2024, 9:38 AM Please see Amion for pager number during day hours 7:00am-4:30pm

## 2024-01-29 NOTE — Progress Notes (Signed)
 PROGRESS NOTE    [name] [name].  ZOX:[id number] DOB: [birth year] [name]: 01/26/2024 PCP: Galvin Jules, FNP  Brief Narrative:  78 y.o. male with medical history significant of hypertension, hyperlipidemia, CAD status post stent, chronic systolic CHF presenting with worsening abdominal pain.   Patient reports abdominal pain for the past 5 days.  Due to its persistent nature he came to the ED to be evaluated.   Denies fevers, chills, chest pain, shortness of breath, constipation, diarrhea, nausea, vomiting.   ED Course: Vital signs in the ED notable for blood pressure in the 120s-140 systolic.  Lab workup included CMP with sodium 131, calcium  8.6, protein 6.3, albumin 3.0, T. bili 1.3.  CBC with leukocytosis to 13.5.  Troponin negative x 1 with repeat pending.  Lipase normal.  Lactic acid pending.  Urinalysis pending.  Blood cultures pending.  Chest x-ray showed no acute abnormality.    CTA of the chest abdomen pelvis showed no evidence of aneurysm, dissection, pulmonary embolism.  There was noted to be 50% stenosis of the SMA, high-grade stenosis at the left common iliac and left common femoral artery of 70-90% as well as a left complete occlusion of the left external iliac artery with distal reconstitution.  Further iliac atherosclerosis noted on the right with stenosis in the 50-70% range.  In addition colonic wall thickening with evidence of diverticulitis with diverticular abscess and contained colonic wall perforation also noted.  In addition there was bronchial thickening, retaining stool, small hernias and pulmonary nodules.   General surgery consulted in the ED and agree with antibiotics with possible IR intervention to drain this abscess.  They will follow along.  Patient started on Zosyn.   Assessment & Plan:   Principal Problem:   Acute diverticulitis Active Problems:   CAD (coronary artery disease)   Mixed hyperlipidemia   HFrEF (heart failure with reduced ejection fraction)  (HCC)   Status post coronary artery stent placement   Primary hypertension   Perforation and abscess of large intestine concurrent with and due to diverticulitis   Diverticulitis with contained perforation and abscess-patient admitted with abdominal pain for 5 days prior to admission to hospital.   CT showed diverticulitis with diverticular abscess and contained perforation with leukocytosis.  General surgery and IR consulted.  IR could not find a window to place a drain.  White count remains elevated.   Continue Zosyn.   General surgery following.   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  On aspirin  and statin for his CAD. - Will need to follow-up with vascular surgery outpatient   Hypertension - Continue home losartan    Hyperlipidemia - Continue home rosuvastatin    CAD > Status post stents - Continue home ASA, rosuvastatin    Chronic systolic CHF > Last echo I can see was in 2023 with EF 25-30%, G1 DD, normal RV function - Not on a diuretic - Continue home meds -repeat Echo -left ventricular ejection fraction 25 to 30% severely decreased function and demonstrate global hypokinesis and mild LVH.  Sluggish flow at the apex cannot rule out early thrombus.  Right ventricular systolic function mild reduced Cardiology following. On heparin  for concern for LV thrombus  Estimated body mass index is 23.53 kg/m as calculated from the following:   Height as of this encounter: 5' 6.5" (1.689 m).   Weight as of this  encounter: 67.1 kg.  DVT prophylaxis: scd Code Status: full Family Communication:none Disposition Plan:  Status is: Inpatient Remains inpatient appropriate because: acute illness   Consultants:  surgery  Procedures:none Antimicrobials: zosyn  Subjective: C/O back pain relieved with flatus Tolerating full liq Labs  pending  Objective: Vitals:   01/28/24 2049 01/29/24 0527 01/29/24 0748 01/29/24 1100  BP: 101/68 (!) 106/59 115/76 124/63  Pulse: 80 73 76 75  Resp: 18 17    Temp: 98.3 F (36.8 C) 97.6 F (36.4 C) 97.7 F (36.5 C)   TempSrc: Oral  Oral   SpO2: 94% 95% 98%   Weight:      Height:        Intake/Output Summary (Last 24 hours) at 01/29/2024 1120 Last data filed at 01/28/2024 1800 Gross per 24 hour  Intake 153.64 ml  Output --  Net 153.64 ml   Filed Weights   01/26/24 1245  Weight: 67.1 kg    Examination:  General exam: Appears nad Respiratory system: Clear to auscultation. Respiratory effort normal. Cardiovascular system: S1 & S2 heard, RRR. No JVD, murmurs, rubs, gallops or clicks. No pedal edema. Gastrointestinal system: Abdomen is nondistended, soft and supra pubic tender decreased compared to yesterday no organomegaly or masses felt. Normal bowel sounds heard. Central nervous system: Alert and oriented. No focal neurological deficits. Extremities: no edema   Data Reviewed: I have personally reviewed following labs and imaging studies  CBC: Recent Labs  Lab 01/26/24 1247 01/27/24 0641 01/28/24 0735  WBC 13.5* 14.9* 14.1*  HGB 14.3 13.7 13.1  HCT 42.4 41.2 37.4*  MCV 85.8 85.7 84.6  PLT 242 275 263   Basic Metabolic Panel: Recent Labs  Lab 01/26/24 1247 01/27/24 0641 01/28/24 0735  NA 131* 130* 133*  K 3.9 4.0 4.1  CL 98 100 101  CO2 25 18* 22  GLUCOSE 97 71 91  BUN 17 17 16   CREATININE 0.74 0.76 0.75  CALCIUM  8.6* 8.5* 8.2*   GFR: Estimated Creatinine Clearance: 70 mL/min (by C-G formula based on SCr of 0.75 mg/dL). Liver Function Tests: Recent Labs  Lab 01/26/24 1247 01/27/24 0641 01/28/24 0735  AST 18 19 17   ALT 17 15 15   ALKPHOS 60 56 56  BILITOT 1.3* 1.6* 1.2  PROT 6.3* 6.9 5.5*  ALBUMIN 3.0* 2.8* 2.5*   Recent Labs  Lab 01/26/24 1247  LIPASE 28   No results for input(s): "AMMONIA" in the last 168 hours. Coagulation  Profile: No results for input(s): "INR", "PROTIME" in the last 168 hours. Cardiac Enzymes: No results for input(s): "CKTOTAL", "CKMB", "CKMBINDEX", "TROPONINI" in the last 168 hours. BNP (last 3 results) No results for input(s): "PROBNP" in the last 8760 hours. HbA1C: No results for input(s): "HGBA1C" in the last 72 hours. CBG: No results for input(s): "GLUCAP" in the last 168 hours. Lipid Profile: No results for input(s): "CHOL", "HDL", "LDLCALC", "TRIG", "CHOLHDL", "LDLDIRECT" in the last 72 hours. Thyroid  Function Tests: No results for input(s): "TSH", "T4TOTAL", "FREET4", "T3FREE", "THYROIDAB" in the last 72 hours. Anemia Panel: No results for input(s): "VITAMINB12", "FOLATE", "FERRITIN", "TIBC", "IRON", "RETICCTPCT" in the last 72 hours. Sepsis Labs: Recent Labs  Lab 01/26/24 1547  LATICACIDVEN 1.1    Recent Results (from the past 240 hours)  Culture, blood (routine x 2)     Status: None (Preliminary result)   Collection Time: 01/26/24  3:15 PM   Specimen: BLOOD RIGHT FOREARM  Result Value Ref Range Status   Specimen Description BLOOD RIGHT FOREARM  Final  Special Requests   Final    BOTTLES DRAWN AEROBIC AND ANAEROBIC Blood Culture adequate volume   Culture   Final    NO GROWTH 3 DAYS Performed at [facility], [address] [place], [place] [address]    Report Status PENDING  Incomplete  Culture, blood (routine x 2)     Status: None (Preliminary result)   Collection Time: 01/26/24  3:35 PM   Specimen: BLOOD RIGHT WRIST  Result Value Ref Range Status   Specimen Description BLOOD RIGHT WRIST  Final   Special Requests   Final    BOTTLES DRAWN AEROBIC AND ANAEROBIC Blood Culture adequate volume   Culture   Final    NO GROWTH 3 DAYS Performed at [facility], [address], [place], [place] [address]    Report Status PENDING  Incomplete         Radiology Studies: ECHOCARDIOGRAM COMPLETE Result Date: 01/28/2024    ECHOCARDIOGRAM REPORT    Patient Name:   [name] [name]. Date of Exam: 01/28/2024 Medical Rec #:  [id number]           Height:       66.5 in Accession #:    [id number]          Weight:       148.0 lb Date of Birth:  1945/12/12           BSA:          1.769 m Patient Age:    78 years            BP:           114/64 mmHg Patient Gender: M                   HR:           72 bpm. Exam Location:  Inpatient Procedure: 2D Echo, Cardiac Doppler and Color Doppler (Both Spectral and Color            Flow Doppler were utilized during procedure). Indications:    Z01.818 Encounter for other preprocedural examination  History:        Patient has prior history of Echocardiogram examinations, most                 recent 02/11/2022.  Sonographer:    Andrena Bang Referring Phys: [phone] LINDSAY B ROBERTS IMPRESSIONS  1. There is swirling of definity contrast in the apex with no obvious LV thrombus. Findings c/w sluggish flow in the apex. Cannot rule out early forming thrombus. . Left ventricular ejection fraction, by estimation, is 25 to 30%. The left ventricle has severely decreased function. The left ventricle demonstrates global hypokinesis. There is mild left ventricular hypertrophy of the infero-lateral segment. Left ventricular diastolic function could not be evaluated.  2. Right ventricular systolic function is mildly reduced. The right ventricular size is normal. Tricuspid regurgitation signal is inadequate for assessing PA pressure.  3. The mitral valve is degenerative. Trivial mitral valve regurgitation. No evidence of mitral stenosis.  4. The aortic valve is tricuspid. Aortic valve regurgitation is not visualized. Aortic valve sclerosis/calcification is present, without any evidence of aortic stenosis. Aortic valve area, by VTI measures 2.91 cm. Aortic valve mean gradient measures 2.0 mmHg. Aortic valve Vmax measures 1.01 m/s.  5. The inferior vena cava is normal in size with greater than 50% respiratory variability, suggesting right atrial pressure  of 3 mmHg. FINDINGS  Left Ventricle: There is  swirling of definity contrast in the apex with no obvious LV thrombus. Findings c/w sluggish flow in the apex. Cannot rule out early forming thrombus. Left ventricular ejection fraction, by estimation, is 25 to 30%. The left ventricle has severely decreased function. The left ventricle demonstrates global hypokinesis. Definity contrast agent was given IV to delineate the left ventricular endocardial borders. The left ventricular internal cavity size was normal in size. There  is mild left ventricular hypertrophy of the infero-lateral segment. Abnormal (paradoxical) septal motion, consistent with left bundle branch block. Left ventricular diastolic function could not be evaluated. Right Ventricle: The right ventricular size is normal. No increase in right ventricular wall thickness. Right ventricular systolic function is mildly reduced. Tricuspid regurgitation signal is inadequate for assessing PA pressure. Left Atrium: Left atrial size was normal in size. Right Atrium: Right atrial size was normal in size. Pericardium: There is no evidence of pericardial effusion. Mitral Valve: The mitral valve is degenerative in appearance. There is mild thickening of the mitral valve leaflet(s). There is mild calcification of the mitral valve leaflet(s). Trivial mitral valve regurgitation. No evidence of mitral valve stenosis. Tricuspid Valve: The tricuspid valve is normal in structure. Tricuspid valve regurgitation is trivial. No evidence of tricuspid stenosis. Aortic Valve: The aortic valve is tricuspid. Aortic valve regurgitation is not visualized. Aortic valve sclerosis/calcification is present, without any evidence of aortic stenosis. Aortic valve mean gradient measures 2.0 mmHg. Aortic valve peak gradient measures 4.1 mmHg. Aortic valve area, by VTI measures 2.91 cm. Pulmonic Valve: The pulmonic valve was normal in structure. Pulmonic valve regurgitation is not visualized. No  evidence of pulmonic stenosis. Aorta: The aortic root is normal in size and structure. Venous: The inferior vena cava is normal in size with greater than 50% respiratory variability, suggesting right atrial pressure of 3 mmHg. IAS/Shunts: No atrial level shunt detected by color flow Doppler.  LEFT VENTRICLE PLAX 2D LVIDd:         5.00 cm      Diastology LVIDs:         4.30 cm      LV e' lateral: 12.80 cm/s LV PW:         1.40 cm LV IVS:        0.80 cm LVOT diam:     2.20 cm LV SV:         56 LV SV Index:   32 LVOT Area:     3.80 cm  LV Volumes (MOD) LV vol d, MOD A2C: 138.0 ml LV vol d, MOD A4C: 129.0 ml LV vol s, MOD A2C: 76.0 ml LV vol s, MOD A4C: 82.5 ml LV SV MOD A2C:     62.0 ml LV SV MOD A4C:     129.0 ml LV SV MOD BP:      52.2 ml RIGHT VENTRICLE RV S prime:     7.40 cm/s TAPSE (M-mode): 1.4 cm LEFT ATRIUM             Index LA diam:        3.10 cm 1.75 cm/m LA Vol (A2C):   37.3 ml 21.08 ml/m LA Vol (A4C):   27.1 ml 15.32 ml/m LA Biplane Vol: 31.6 ml 17.86 ml/m  AORTIC VALVE AV Area (Vmax):    3.15 cm AV Area (Vmean):   3.06 cm AV Area (VTI):     2.91 cm AV Vmax:           101.00 cm/s AV Vmean:  65.800 cm/s AV VTI:            0.192 m AV Peak Grad:      4.1 mmHg AV Mean Grad:      2.0 mmHg LVOT Vmax:         83.80 cm/s LVOT Vmean:        52.900 cm/s LVOT VTI:          0.147 m LVOT/AV VTI ratio: 0.77  SHUNTS Systemic VTI:  0.15 m Systemic Diam: 2.20 cm Gaylyn Keas MD Electronically signed by Gaylyn Keas MD Signature Date/Time: 01/28/2024/10:01:41 AM    Final     Scheduled Meds:  feeding supplement  1 Container Oral TID BM   iohexol   500 mL Oral Q1H   losartan   50 mg Oral Daily   metoprolol  succinate  12.5 mg Oral Daily   rosuvastatin   20 mg Oral Daily   sodium chloride  flush  3 mL Intravenous Q12H   Continuous Infusions:  heparin  1,150 Units/hr (01/29/24 0930)   piperacillin-tazobactam (ZOSYN)  IV 3.375 g (01/29/24 0508)     LOS: 3 days    Time spent: 50 min  [name] Lew, MD  01/29/2024, 11:20 AM

## 2024-01-30 DIAGNOSIS — K5792 Diverticulitis of intestine, part unspecified, without perforation or abscess without bleeding: Secondary | ICD-10-CM | POA: Diagnosis not present

## 2024-01-30 LAB — BASIC METABOLIC PANEL WITH GFR
Anion gap: 12 (ref 5–15)
BUN: 15 mg/dL (ref 8–23)
CO2: 19 mmol/L — ABNORMAL LOW (ref 22–32)
Calcium: 8.3 mg/dL — ABNORMAL LOW (ref 8.9–10.3)
Chloride: 98 mmol/L (ref 98–111)
Creatinine, Ser: 0.63 mg/dL (ref 0.61–1.24)
GFR, Estimated: >60 mL/min (ref >60)
Glucose, Bld: 86 mg/dL (ref 70–99)
Potassium: 4 mmol/L (ref 3.5–5.1)
Sodium: 129 mmol/L — ABNORMAL LOW (ref 135–145)

## 2024-01-30 LAB — CBC
HCT: 34.9 % — ABNORMAL LOW (ref 39.0–52.0)
HCT: 32.6 % — ABNORMAL LOW (ref 39.0–52.0)
Hemoglobin: 12 g/dL — ABNORMAL LOW (ref 13.0–17.0)
Hemoglobin: 11.2 g/dL — ABNORMAL LOW (ref 13.0–17.0)
MCH: 29 pg (ref 26.0–34.0)
MCH: 28.8 pg (ref 26.0–34.0)
MCHC: 34.4 g/dL (ref 30.0–36.0)
MCHC: 34.4 g/dL (ref 30.0–36.0)
MCV: 84.3 fL (ref 80.0–100.0)
MCV: 83.8 fL (ref 80.0–100.0)
Platelets: 198 10*3/uL (ref 150–400)
Platelets: 255 10*3/uL (ref 150–400)
RBC: 4.14 MIL/uL — ABNORMAL LOW (ref 4.22–5.81)
RBC: 3.89 MIL/uL — ABNORMAL LOW (ref 4.22–5.81)
RDW: 13.1 % (ref 11.5–15.5)
RDW: 13.2 % (ref 11.5–15.5)
WBC: 11.7 10*3/uL — ABNORMAL HIGH (ref 4.0–10.5)
WBC: 11.8 10*3/uL — ABNORMAL HIGH (ref 4.0–10.5)
nRBC: 0 % (ref 0.0–0.2)
nRBC: 0 % (ref 0.0–0.2)

## 2024-01-30 LAB — HEPARIN LEVEL (UNFRACTIONATED): Heparin Unfractionated: 0.29 [IU]/mL — ABNORMAL LOW (ref 0.30–0.70)

## 2024-01-30 MED ORDER — POLYETHYLENE GLYCOL 3350 17 G PO PACK
17.0000 g | PACK | Freq: Every day | ORAL | Status: DC
  Administered 2024-01-31 – 2024-02-05 (×5): 17 g via ORAL
  Filled 2024-01-30 (×8): qty 1

## 2024-01-30 NOTE — Plan of Care (Signed)
 IR was requested for image guided intraabdominal asp/drain placement.   Case was reviewed by Dr. Marlena Sima, the collection is not accessible percutaneously. Dr. [name] notified by Dr. Marlena Sima.   Will delete the IR rad eval order.  Please call IR for questions and concerns.   Mada Sadik H Fraser Busche PA-C 01/30/2024 2:14 PM

## 2024-01-30 NOTE — Progress Notes (Signed)
 PHARMACY - ANTICOAGULATION CONSULT NOTE  Pharmacy Consult for heparin  Indication: High risk for LV thrombus  Allergies  Allergen Reactions   Carvedilol  Diarrhea   Lipitor [Atorvastatin] Other (See Comments)    "Makes pt feel funky"   Plavix  [Clopidogrel ] Rash    Patient Measurements: Height: 5' 6.5" (168.9 cm) Weight: 67.1 kg (148 lb) IBW/kg (Calculated) : 64.95 HEPARIN  DW (KG): 67.1  Vital Signs: Temp: 97.8 F (36.6 C) (05/17 0410) BP: 119/61 (05/17 0410) Pulse Rate: 70 (05/17 0410)  Labs: Recent Labs    01/28/24 0735 01/28/24 2303 01/29/24 1032 01/29/24 2144 01/30/24 0506  HGB 13.1  --  13.4  --  12.0*  HCT 37.4*  --  39.7  --  34.9*  PLT 263  --  318  --  198  HEPARINUNFRC  --    < > <0.10* 0.23* 0.29*  CREATININE 0.75  --   --   --  0.63   < > = values in this interval not displayed.    Estimated Creatinine Clearance: 70 mL/min (by C-G formula based on SCr of 0.63 mg/dL).   Medical History: Past Medical History:  Diagnosis Date   Coronary artery disease    Erectile dysfunction    HFrEF (heart failure with reduced ejection fraction) (HCC)    Echocardiogram 07/14/21:  EF 25, global HK, inf HK worse, Gr 2 DD, normal RVSF, mild to mod MR, AV sclerosis w/o AS Echocardiogram 2/23: EF 25-30, global HK, GLS -12.9, normal RVSF, mild to mod MR, AV sclerosis w/o AS    History of hiatal hernia    a lone time ago   Hyperlipidemia    [place] fracture [facility]) 03/22/2020   Myocardial infarction (HCC)    x 2   [place]       Assessment: 78 yo male with sigmoid diverticulitis with perforation and abscess. Echo results today show high risk LV thrombus and pharmacy consulted to dose heparin . No anticoagulants noted PTA.   HL 0.29  Goal of Therapy:  Heparin  level 0.3-0.7 units/ml Monitor platelets by anticoagulation protocol: Yes   Plan:  Heparin  to 1550 units / hr Daily HL , CBC F/u surgical plans as applicable  Thank you Lennice Quivers, PharmD 01/30/2024 7:46  AM

## 2024-01-30 NOTE — Plan of Care (Signed)
   Problem: Health Behavior/Discharge Planning: Goal: Ability to manage health-related needs will improve Outcome: Progressing   Problem: Clinical Measurements: Goal: Ability to maintain clinical measurements within normal limits will improve Outcome: Progressing Goal: Will remain free from infection Outcome: Progressing

## 2024-01-30 NOTE — Progress Notes (Addendum)
 IR (discussed with Dr Marlena Sima) says still no window for them to place drain.  so I think he will need surgery for definitive mgmt.  I agree with Dr Delane Fear that antibiotics alone with likely fail given his 2 large pericolonic fluid collections/abscesses and he will bounce right back once abx done and/or progression of disease process in pelvis.  we have a lot of urgent surgeries for tomorrow already. so surgery may be tomorrow depending on how our cases go or may have to be Monday.   Will change to Full liquid diet now and give some miralax  as a gentle bowel prep for anticipated surgery either Sunday or Monday  Will npo p midnight except meds in case OR schedule allows us  to proceed tomorrow.    I went to pt's bedside and discussed his CT scan as well as no safe window for IR.  Discussed my concern that I think antibiotics alone would not be sufficient to resolve his diverticulitis with associated colonic fluid collections.  I discussed the procedure in detail.    We discussed the risks and benefits of surgery including, but not limited to bleeding, infection (such as wound infection, abdominal abscess), injury to surrounding structures, blood clot formation, urinary retention, incisional hernia, colostomy issues, anesthesia risks, pulmonary & cardiac complications such as pneumonia &/or heart attack, need for additional procedures, ileus, & prolonged hospitalization.  We discussed the typical postoperative recovery course, including limitations & restrictions postoperatively. I explained that the likelihood of improvement in their symptoms is fair to good.  We did discuss that should he have a major postoperative complication the likelihood of him developing additional organ system dysfunction would be high as well as likelihood of not surviving the hospitalization.   He voiced understanding and wishes to proceed with surgery.  He understands that timing of surgery is a little bit up in the air  depending on acuity of the service  Also expressed concerns that his call bell was not functioning and he had discussed with staff.  I tried utilizing the call button as well and it appeared nonfunctional for the nurse call.  I discussed it with the charge nurse   [name]. [name] Hamel, MD, FACS General, Bariatric, & Minimally Invasive Surgery [facility],  [facility]

## 2024-01-30 NOTE — Progress Notes (Signed)
 PROGRESS NOTE    [name] [name].  ZOX:[id number] DOB: 11-06-1945 [name]: 01/26/2024 PCP: Galvin Jules, FNP  Brief Narrative:  78 y.o. male with medical history significant of hypertension, hyperlipidemia, CAD status post stent, chronic systolic CHF presenting with worsening abdominal pain.   Patient reports abdominal pain for the past 5 days.  Due to its persistent nature he came to the ED to be evaluated.   Denies fevers, chills, chest pain, shortness of breath, constipation, diarrhea, nausea, vomiting.   ED Course: Vital signs in the ED notable for blood pressure in the 120s-140 systolic.  Lab workup included CMP with sodium 131, calcium  8.6, protein 6.3, albumin 3.0, T. bili 1.3.  CBC with leukocytosis to 13.5.  Troponin negative x 1 with repeat pending.  Lipase normal.  Lactic acid pending.  Urinalysis pending.  Blood cultures pending.  Chest x-ray showed no acute abnormality.    CTA of the chest abdomen pelvis showed no evidence of aneurysm, dissection, pulmonary embolism.  There was noted to be 50% stenosis of the SMA, high-grade stenosis at the left common iliac and left common femoral artery of 70-90% as well as a left complete occlusion of the left external iliac artery with distal reconstitution.  Further iliac atherosclerosis noted on the right with stenosis in the 50-70% range.  In addition colonic wall thickening with evidence of diverticulitis with diverticular abscess and contained colonic wall perforation also noted.  In addition there was bronchial thickening, retaining stool, small hernias and pulmonary nodules.   General surgery consulted in the ED and agree with antibiotics with possible IR intervention to drain this abscess.  They will follow along.  Patient started on Zosyn .   Assessment & Plan:   Principal Problem:   Acute diverticulitis Active Problems:   CAD (coronary artery disease)   Mixed hyperlipidemia   HFrEF (heart failure with reduced ejection fraction)  (HCC)   Status post coronary artery stent placement   Primary hypertension   Perforation and abscess of large intestine concurrent with and due to diverticulitis   Diverticulitis with contained perforation and abscess-patient admitted with abdominal pain for 5 days prior to admission to hospital.   CT showed diverticulitis with diverticular abscess and contained perforation with leukocytosis.  General surgery and IR consulted.  IR could not find a window to place a drain.  White count remains elevated.   Continue Zosyn .   General surgery following.   Surgery has started him on soft diet Repeat CT 5/16 still with fluid coll  PAD > Incidentally found to have severe iliac artery disease on the left and moderate to severe disease on the right.  On aspirin  and statin for his CAD. - Will need to follow-up with vascular surgery outpatient   Hypertension - Continue home losartan    Hyperlipidemia - Continue home rosuvastatin    CAD > Status post stents - Continue home ASA, rosuvastatin    Chronic systolic CHF > Last echo I can see was in 2023 with EF 25-30%, G1 DD, normal RV function - Not on a diuretic - Continue home meds -repeat Echo -left ventricular ejection fraction 25 to 30% severely decreased function and demonstrate global hypokinesis and mild LVH.  Sluggish flow at the apex cannot rule out early thrombus.  Right ventricular systolic function mild reduced Cardiology following. On heparin  for concern for LV thrombus  Estimated body mass index is 23.53 kg/m as calculated from the following:   Height as of this encounter: 5' 6.5" (1.689 m).  Weight as of this encounter: 67.1 kg.  DVT prophylaxis: scd Code Status: full Family Communication:none Disposition Plan:  Status is: Inpatient Remains inpatient appropriate because: acute illness   Consultants:  surgery  Procedures:none Antimicrobials: zosyn   Subjective:  Nurse reported patient had 2 bloody bowel movements  followed by 2 regular bowel movements which were brown in color.  CT scan from yesterday's with persistent fluid collection abscess noted He does have complaints of lower back pain which he says it improves with passing gas.  Objective: Vitals:   01/30/24 0410 01/30/24 0820 01/30/24 0853 01/30/24 1316  BP: 119/61 (!) 107/56 105/60 111/69  Pulse: 70 73 76 78  Resp: 16 18    Temp: 97.8 F (36.6 C) 97.6 F (36.4 C)  98.8 F (37.1 C)  TempSrc:    Oral  SpO2: 96% 100%  96%  Weight:      Height:        Intake/Output Summary (Last 24 hours) at 01/30/2024 1323 Last data filed at 01/30/2024 0543 Gross per 24 hour  Intake 225 ml  Output --  Net 225 ml   Filed Weights   01/26/24 1245  Weight: 67.1 kg    Examination:  General exam: Appears nad Respiratory system: Clear to auscultation. Respiratory effort normal. Cardiovascular system: S1 & S2 heard, RRR. No JVD, murmurs, rubs, gallops or clicks. No pedal edema. Gastrointestinal system: Abdomen is nondistended, soft and supra pubic tender decreased compared to yesterday no organomegaly or masses felt. Normal bowel sounds heard. Central nervous system: Alert and oriented. No focal neurological deficits. Extremities: no edema   Data Reviewed: I have personally reviewed following labs and imaging studies  CBC: Recent Labs  Lab 01/26/24 1247 01/27/24 0641 01/28/24 0735 01/29/24 1032 01/30/24 0506  WBC 13.5* 14.9* 14.1* 14.7* 11.7*  HGB 14.3 13.7 13.1 13.4 12.0*  HCT 42.4 41.2 37.4* 39.7 34.9*  MCV 85.8 85.7 84.6 85.6 84.3  PLT 242 275 263 318 198   Basic Metabolic Panel: Recent Labs  Lab 01/26/24 1247 01/27/24 0641 01/28/24 0735 01/30/24 0506  NA 131* 130* 133* 129*  K 3.9 4.0 4.1 4.0  CL 98 100 101 98  CO2 25 18* 22 19*  GLUCOSE 97 71 91 86  BUN 17 17 16 15   CREATININE 0.74 0.76 0.75 0.63  CALCIUM  8.6* 8.5* 8.2* 8.3*   GFR: Estimated Creatinine Clearance: 70 mL/min (by C-G formula based on SCr of 0.63  mg/dL). Liver Function Tests: Recent Labs  Lab 01/26/24 1247 01/27/24 0641 01/28/24 0735  AST 18 19 17   ALT 17 15 15   ALKPHOS 60 56 56  BILITOT 1.3* 1.6* 1.2  PROT 6.3* 6.9 5.5*  ALBUMIN 3.0* 2.8* 2.5*   Recent Labs  Lab 01/26/24 1247  LIPASE 28   No results for input(s): "AMMONIA" in the last 168 hours. Coagulation Profile: No results for input(s): "INR", "PROTIME" in the last 168 hours. Cardiac Enzymes: No results for input(s): "CKTOTAL", "CKMB", "CKMBINDEX", "TROPONINI" in the last 168 hours. BNP (last 3 results) No results for input(s): "PROBNP" in the last 8760 hours. HbA1C: No results for input(s): "HGBA1C" in the last 72 hours. CBG: No results for input(s): "GLUCAP" in the last 168 hours. Lipid Profile: No results for input(s): "CHOL", "HDL", "LDLCALC", "TRIG", "CHOLHDL", "LDLDIRECT" in the last 72 hours. Thyroid  Function Tests: No results for input(s): "TSH", "T4TOTAL", "FREET4", "T3FREE", "THYROIDAB" in the last 72 hours. Anemia Panel: No results for input(s): "VITAMINB12", "FOLATE", "FERRITIN", "TIBC", "IRON", "RETICCTPCT" in the last 72 hours.  Sepsis Labs: Recent Labs  Lab 01/26/24 1547  LATICACIDVEN 1.1    Recent Results (from the past 240 hours)  Culture, blood (routine x 2)     Status: None (Preliminary result)   Collection Time: 01/26/24  3:15 PM   Specimen: BLOOD RIGHT FOREARM  Result Value Ref Range Status   Specimen Description BLOOD RIGHT FOREARM  Final   Special Requests   Final    BOTTLES DRAWN AEROBIC AND ANAEROBIC Blood Culture adequate volume   Culture   Final    NO GROWTH 4 DAYS Performed at [facility], [address], [place], [place] [address]    Report Status PENDING  Incomplete  Culture, blood (routine x 2)     Status: None (Preliminary result)   Collection Time: 01/26/24  3:35 PM   Specimen: BLOOD RIGHT WRIST  Result Value Ref Range Status   Specimen Description BLOOD RIGHT WRIST  Final   Special Requests   Final     BOTTLES DRAWN AEROBIC AND ANAEROBIC Blood Culture adequate volume   Culture   Final    NO GROWTH 4 DAYS Performed at [facility], [address], [place], [place] [address]    Report Status PENDING  Incomplete         Radiology Studies: CT ABDOMEN PELVIS W CONTRAST Addendum Date: 01/29/2024 ADDENDUM REPORT: 01/29/2024 17:03 ADDENDUM: The possibility of a perforated sigmoid colon carcinoma needs to be considered. Electronically Signed   By: Catherin Closs M.D.   On: 01/29/2024 17:03   Result Date: 01/29/2024 CLINICAL DATA:  Follow-up perforated sigmoid diverticulitis with abscess. EXAM: CT ABDOMEN AND PELVIS WITH CONTRAST TECHNIQUE: Multidetector CT imaging of the abdomen and pelvis was performed using the standard protocol following bolus administration of intravenous contrast. RADIATION DOSE REDUCTION: This exam was performed according to the departmental dose-optimization program which includes automated exposure control, adjustment of the mA and/or kV according to patient size and/or use of iterative reconstruction technique. CONTRAST:  75mL OMNIPAQUE  IOHEXOL  350 MG/ML SOLN COMPARISON:  01/26/2024 FINDINGS: Lower chest: Normal sized heart.  Minimal left basilar atelectasis. Hepatobiliary: Multiple small liver cysts. Unremarkable gallbladder. Pancreas: Unremarkable. No pancreatic ductal dilatation or surrounding inflammatory changes. Spleen: Normal in size without focal abnormality. Adrenals/Urinary Tract: Normal-appearing adrenal glands. Small bilateral simple appearing renal cysts. These do not need imaging follow-up. Unremarkable ureters and urinary bladder. Stomach/Bowel: Previously demonstrated segmental wall thickening involving the mid to distal sigmoid colon with 2 adjacent fluid collections, 1 containing gas. There is rim enhancement surrounding the more inferior collection on the left. This collection measures 4.6 x 3.9 cm on image number 59/3, previously 4.4 x 2.9 cm. The more  superior collection has partial surrounding rim enhancement continues to cane chain some gas. This measures 5.4 x 3.6 cm on image number 54/3 and previously measured 5.4 x 3.9 cm. No new fluid collections. No free peritoneal air. Unremarkable stomach and small bowel.  Surgically absent appendix. Vascular/Lymphatic: Atheromatous arterial calcifications without aneurysm. No enlarged lymph nodes. Reproductive: Moderately enlarged prostate gland. Other: Small bilateral inguinal hernias containing fat. Musculoskeletal: Lumbar spine postsurgical changes with fixation hardware and interbody fusion at the L4-5 level. Lumbar and lower thoracic spine degenerative changes. IMPRESSION: 1. Persistent findings of perforated sigmoid diverticulitis with 2 adjacent fluid collections, 1 containing gas. The more inferior collection has slightly increased in size and has thin surrounding rim enhancement, suspicious for abscess. The more superior collection containing some gas is stable in size. 2. No new fluid collections.  3. Moderately enlarged prostate gland. Electronically Signed: By: Catherin Closs M.D. On: 01/29/2024 16:31    Scheduled Meds:  feeding supplement  1 Container Oral TID BM   losartan   50 mg Oral Daily   metoprolol  succinate  12.5 mg Oral Daily   rosuvastatin   20 mg Oral Daily   sodium chloride  flush  3 mL Intravenous Q12H   Continuous Infusions:  heparin  1,550 Units/hr (01/30/24 0906)   piperacillin -tazobactam (ZOSYN )  IV 3.375 g (01/30/24 1320)     LOS: 4 days    Time spent: 39 min Barbee Lew, MD  01/30/2024, 1:23 PM

## 2024-01-30 NOTE — Progress Notes (Signed)
 Pt reported two bloody bowel movements during AM assessment. RN called and spoke with Lennice Quivers, [facility] @ 9:05am to inquire if Heparin  gtt rate increase from 14.24mL/hr to 15.49mL/hr was appropriate in setting of hematochezia. Per [place], [facility] rate increase is safe.   Chalmers Columbus, MD notified of pts self-report of hematochezia @ 9:18a.  RN verified rate increase @ bedside with 2nd RN Lyman Sander).   Pt educated to inform RN of any bleeding, including in bowel movement. Pt agreed.

## 2024-01-30 NOTE — Progress Notes (Addendum)
 Pt requesting all surgical procedures be postponed until Monday when son is available so that pt can talk to son and update him on procedure. (Son is currently on camping trip and per pt he does not have access to phone out in the woods). Pt also requesting to complete POA paperwork prior to surgery.   Gen sx [name], MD) and [facility] hospitalist Augustus Ledger, MD) informed. NPO order for midnight discontinued.  RN reached out to weekend/covering social worker to assist with POA documentation. Pt aware POA completion may not be possible until Monday.

## 2024-01-30 NOTE — Progress Notes (Signed)
 Patient ID: [name] [name]., male   DOB: [birth year], 78 y.o.   MRN: [id number]   Acute Care Surgery Service Progress Note:    Chief Complaint/Subjective: Soreness in b/l lower back/upper buttock area No n/v Having BMs; had 2 with dark blood overnight but has had 2 more that were brown  Objective: Vital signs in last 24 hours: Temp:  [97.6 F (36.4 C)-97.8 F (36.6 C)] 97.6 F (36.4 C) (05/17 0820) Pulse Rate:  [70-76] 76 (05/17 0853) Resp:  [16-18] 18 (05/17 0820) BP: (105-119)/(56-80) 105/60 (05/17 0853) SpO2:  [96 %-100 %] 100 % (05/17 0820) Last BM Date : 01/30/24  Intake/Output from previous day: 05/16 0701 - 05/17 0700 In: 225 [P.O.:225] Out: -  Intake/Output this shift: No intake/output data recorded.  Lungs: nonlabored  Cardiovascular: reg  Abd: soft, nt, nd  Extremities: no edema, +SCDs  Neuro: alert, nonfocal  Lab Results: CBC  Recent Labs    01/29/24 1032 01/30/24 0506  WBC 14.7* 11.7*  HGB 13.4 12.0*  HCT 39.7 34.9*  PLT 318 198   BMET Recent Labs    01/28/24 0735 01/30/24 0506  NA 133* 129*  K 4.1 4.0  CL 101 98  CO2 22 19*  GLUCOSE 91 86  BUN 16 15  CREATININE 0.75 0.63  CALCIUM  8.2* 8.3*   LFT    Latest Ref Rng & Units 01/28/2024    7:35 AM 01/27/2024    6:41 AM 01/26/2024   12:47 PM  Hepatic Function  Total Protein 6.5 - 8.1 g/dL 5.5  6.9  6.3   Albumin 3.5 - 5.0 g/dL 2.5  2.8  3.0   AST 15 - 41 U/L 17  19  18    ALT 0 - 44 U/L 15  15  17    Alk Phosphatase 38 - 126 U/L 56  56  60   Total Bilirubin 0.0 - 1.2 mg/dL 1.2  1.6  1.3    PT/INR No results for input(s): "LABPROT", "INR" in the last 72 hours. ABG No results for input(s): "PHART", "HCO3" in the last 72 hours.  Invalid input(s): "PCO2", "PO2"  Studies/Results:  Anti-infectives: Anti-infectives (From admission, onward)    Start     Dose/Rate Route Frequency Ordered Stop   01/26/24 2200  piperacillin -tazobactam (ZOSYN ) IVPB 3.375 g       Placed in "Followed  by" Linked Group   3.375 g 12.5 mL/hr over 240 Minutes Intravenous Every 8 hours 01/26/24 1518     01/26/24 1530  piperacillin -tazobactam (ZOSYN ) IVPB 3.375 g       Placed in "Followed by" Linked Group   3.375 g 100 mL/hr over 30 Minutes Intravenous  Once 01/26/24 1518 01/26/24 1621       Medications: Scheduled Meds:  feeding supplement  1 Container Oral TID BM   losartan   50 mg Oral Daily   metoprolol  succinate  12.5 mg Oral Daily   rosuvastatin   20 mg Oral Daily   sodium chloride  flush  3 mL Intravenous Q12H   Continuous Infusions:  heparin  1,550 Units/hr (01/30/24 0906)   piperacillin -tazobactam (ZOSYN )  IV 3.375 g (01/30/24 0510)   PRN Meds:.acetaminophen  **OR** acetaminophen , albuterol , HYDROcodone -acetaminophen , HYDROmorphone  (DILAUDID ) injection, polyethylene glycol  Assessment/Plan: Patient Active Problem List   Diagnosis Date Noted   Acute diverticulitis 01/26/2024   Perforation and abscess of large intestine concurrent with and due to diverticulitis 01/26/2024   Seasonal allergic rhinitis due to pollen 01/08/2024   PVC's (premature ventricular contractions) 07/16/2022   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) 07/09/2022   Vitamin D  deficiency 07/09/2022   Primary hypertension 10/22/2021   Status post coronary artery stent placement    HFrEF (heart failure with reduced ejection fraction) (HCC)    Surgical defect of lip 03/13/2020   History of non-ST elevation myocardial infarction (NSTEMI)    Spondylolisthesis at L4-L5 level 06/08/2017   CAD (coronary artery disease) 05/08/2017   Mixed hyperlipidemia 05/08/2017   Erectile dysfunction 03/29/2013   Sigmoid diverticulitis with perforation and abscess  - CTA abdomen pelvis shows circumferential wall thickening of the mid sigmoid colon with contained perforation (6.1 x 4.0 x 6.0 cm).  - IR note 5/13 reports there is no window for percutaneous drain placement.  - HDS without fever, tachycardia or hypotension.  WBC downtrending on last check - No peritonitis on exam. No current indication for emergency surgery - Cont IV abx - Adv to soft diet - repeat CT 5/16 - persistent perforated presumed diverticulitis with 2 abscesses- one of which slightly increased, superior collection stable with gas - If patient fails to improve they may require surgical intervention resulting in a colectomy/colostomy.  This was discussed with the patient. Cardiology has seen and obtained echo w/ findings of EF 25-30% and possible LV thrombus formation. They are starting anticoagulation but feel it is okay to proceed to surgery if needed given lack of HF symptoms or angina. - If patient improves with conservative therapies would recommend colonoscopy in ~6-8 weeks.   - will ask IR to review CT from yesterday and see if now amenable to drain.  If not amenable to drain, I'm concerned that abx alone will not be sufficient to adequately treat current situation and pt will need surgery   FEN - Soft diet, IVF per primary; hyponatremia - per [facility] VTE - SCDs, heparin  gtt ID - Zosyn    LV thrombus? PAD HFrEF CAD HLD HTN Remote H/O polio    I reviewed nursing notes, hospitalist notes, last 24 h vitals and pain scores, last 48 h intake and output, last 24 h labs and trends, and last 24 h imaging results.  Disposition: review CT with IR  LOS: 4 days    [name] [name]. [name] Hamel, MD, FACS General, Bariatric, & Minimally Invasive Surgery [phone] [facility]

## 2024-01-30 NOTE — Plan of Care (Signed)

## 2024-01-31 DIAGNOSIS — K5792 Diverticulitis of intestine, part unspecified, without perforation or abscess without bleeding: Secondary | ICD-10-CM | POA: Diagnosis not present

## 2024-01-31 LAB — CBC
HCT: 34.6 % — ABNORMAL LOW (ref 39.0–52.0)
Hemoglobin: 11.5 g/dL — ABNORMAL LOW (ref 13.0–17.0)
MCH: 28.4 pg (ref 26.0–34.0)
MCHC: 33.2 g/dL (ref 30.0–36.0)
MCV: 85.4 fL (ref 80.0–100.0)
Platelets: 290 10*3/uL (ref 150–400)
RBC: 4.05 MIL/uL — ABNORMAL LOW (ref 4.22–5.81)
RDW: 13.2 % (ref 11.5–15.5)
WBC: 9.9 10*3/uL (ref 4.0–10.5)
nRBC: 0 % (ref 0.0–0.2)

## 2024-01-31 LAB — CULTURE, BLOOD (ROUTINE X 2)
Culture: NO GROWTH
Culture: NO GROWTH
Special Requests: ADEQUATE
Special Requests: ADEQUATE

## 2024-01-31 LAB — BASIC METABOLIC PANEL WITH GFR
Anion gap: 8 (ref 5–15)
BUN: 7 mg/dL — ABNORMAL LOW (ref 8–23)
CO2: 26 mmol/L (ref 22–32)
Calcium: 8.4 mg/dL — ABNORMAL LOW (ref 8.9–10.3)
Chloride: 99 mmol/L (ref 98–111)
Creatinine, Ser: 0.79 mg/dL (ref 0.61–1.24)
GFR, Estimated: >60 mL/min (ref >60)
Glucose, Bld: 101 mg/dL — ABNORMAL HIGH (ref 70–99)
Potassium: 4.5 mmol/L (ref 3.5–5.1)
Sodium: 133 mmol/L — ABNORMAL LOW (ref 135–145)

## 2024-01-31 LAB — TYPE AND SCREEN
ABO/RH(D): A POS
Antibody Screen: NEGATIVE

## 2024-01-31 LAB — HEPARIN LEVEL (UNFRACTIONATED): Heparin Unfractionated: 0.36 [IU]/mL (ref 0.30–0.70)

## 2024-01-31 LAB — MAGNESIUM: Magnesium: 2.1 mg/dL (ref 1.7–2.4)

## 2024-01-31 MED ORDER — CHLORHEXIDINE GLUCONATE CLOTH 2 % EX PADS
6.0000 | MEDICATED_PAD | Freq: Once | CUTANEOUS | Status: AC
  Administered 2024-01-31: 6 via TOPICAL

## 2024-01-31 MED ORDER — NEOMYCIN SULFATE 500 MG PO TABS
1000.0000 mg | ORAL_TABLET | ORAL | Status: AC
  Administered 2024-01-31 (×3): 1000 mg via ORAL
  Filled 2024-01-31 (×3): qty 2

## 2024-01-31 MED ORDER — METRONIDAZOLE 500 MG PO TABS
1000.0000 mg | ORAL_TABLET | ORAL | Status: AC
  Administered 2024-01-31 (×3): 1000 mg via ORAL
  Filled 2024-01-31 (×3): qty 2

## 2024-01-31 MED ORDER — SODIUM CHLORIDE 0.9 % IV SOLN: 2.0000 g | INTRAVENOUS | Status: DC

## 2024-01-31 NOTE — Plan of Care (Signed)

## 2024-01-31 NOTE — Progress Notes (Signed)
 PROGRESS NOTE    [name]  WGN:[id number] DOB: [birth year] [name]: 01/26/2024 PCP: Galvin Jules, FNP  Brief Narrative:  78 y.o. male with medical history significant of hypertension, hyperlipidemia, CAD status post stent, chronic systolic CHF presenting with worsening abdominal pain.   Patient reports abdominal pain for the past 5 days.  Due to its persistent nature he came to the ED to be evaluated.   Denies fevers, chills, chest pain, shortness of breath, constipation, diarrhea, nausea, vomiting.   ED Course: Vital signs in the ED notable for blood pressure in the 120s-140 systolic.  Lab workup included CMP with sodium 131, calcium  8.6, protein 6.3, albumin 3.0, T. bili 1.3.  CBC with leukocytosis to 13.5.  Troponin negative x 1 with repeat pending.  Lipase normal.  Lactic acid pending.  Urinalysis pending.  Blood cultures pending.  Chest x-ray showed no acute abnormality.    CTA of the chest abdomen pelvis showed no evidence of aneurysm, dissection, pulmonary embolism.  There was noted to be 50% stenosis of the SMA, high-grade stenosis at the left common iliac and left common femoral artery of 70-90% as well as a left complete occlusion of the left external iliac artery with distal reconstitution.  Further iliac atherosclerosis noted on the right with stenosis in the 50-70% range.  In addition colonic wall thickening with evidence of diverticulitis with diverticular abscess and contained colonic wall perforation also noted.  In addition there was bronchial thickening, retaining stool, small hernias and pulmonary nodules.   General surgery consulted in the ED and agree with antibiotics with possible IR intervention to drain this abscess.  They will follow along.  Patient started on Zosyn .   Assessment & Plan:   Principal Problem:   Acute diverticulitis Active Problems:   CAD (coronary artery disease)   Mixed hyperlipidemia   HFrEF (heart failure with reduced ejection fraction)  (HCC)   Status post coronary artery stent placement   Primary hypertension   Perforation and abscess of large intestine concurrent with and due to diverticulitis   Diverticulitis with contained perforation and abscess-patient admitted with abdominal pain for 5 days prior to admission to hospital.   CT showed diverticulitis with diverticular abscess and contained perforation with leukocytosis.  General surgery and IR consulted.  IR could not find a window to place a drain.  White count remains elevated.   Continue Zosyn .   General surgery following.   Surgery has started him on soft diet Repeat CT 5/16 still with fluid coll Plan for OR tomorrow  PAD > Incidentally found to have severe iliac artery disease on the left and moderate to severe disease on the right.  On aspirin  and statin for his CAD. - Will need to follow-up with vascular surgery outpatient   Hypertension - Continue home losartan    Hyperlipidemia - Continue home rosuvastatin    CAD > Status post stents - Continue home ASA, rosuvastatin    Chronic systolic CHF > Last echo I can see was in 2023 with EF 25-30%, G1 DD, normal RV function - Not on a diuretic - Continue home meds -repeat Echo -left ventricular ejection fraction 25 to 30% severely decreased function and demonstrate global hypokinesis and mild LVH.  Sluggish flow at the apex cannot rule out early thrombus.  Right ventricular systolic function mild reduced Cardiology following. On heparin  for concern for LV thrombus  Estimated body mass index is 23.53 kg/m as calculated from the following:   Height as of this encounter: 5' 6.5" (  1.689 m).   Weight as of this encounter: 67.1 kg.  DVT prophylaxis: scd Code Status: full Family Communication:none Disposition Plan:  Status is: Inpatient Remains inpatient appropriate because: acute illness   Consultants:  surgery  Procedures:none Antimicrobials: zosyn   Subjective:  No further bloody  bms  Objective: Vitals:   01/30/24 2038 01/31/24 0422 01/31/24 0823 01/31/24 1145  BP: 105/66 (!) 103/48 121/73 119/63  Pulse: 76 66 69 68  Resp: 16 16  18   Temp: 98.2 F (36.8 C) 97.9 F (36.6 C) 98.1 F (36.7 C) 98 F (36.7 C)  TempSrc: Oral Oral Oral Oral  SpO2: 97% (!) 79% 98% 100%  Weight:      Height:       No intake or output data in the 24 hours ending 01/31/24 1358  Filed Weights   01/26/24 1245  Weight: 67.1 kg    Examination:  General exam: Appears nad Respiratory system: Clear to auscultation. Respiratory effort normal. Cardiovascular system: S1 & S2 heard, RRR. No JVD, murmurs, rubs, gallops or clicks. No pedal edema. Gastrointestinal system: Abdomen is nondistended, soft and supra pubic tender decreased compared to yesterday no organomegaly or masses felt. Normal bowel sounds heard. Central nervous system: Alert and oriented. No focal neurological deficits. Extremities: no edema   Data Reviewed: I have personally reviewed following labs and imaging studies  CBC: Recent Labs  Lab 01/28/24 0735 01/29/24 1032 01/30/24 0506 01/30/24 1918 01/31/24 1025  WBC 14.1* 14.7* 11.7* 11.8* 9.9  HGB 13.1 13.4 12.0* 11.2* 11.5*  HCT 37.4* 39.7 34.9* 32.6* 34.6*  MCV 84.6 85.6 84.3 83.8 85.4  PLT 263 318 198 255 290   Basic Metabolic Panel: Recent Labs  Lab 01/26/24 1247 01/27/24 0641 01/28/24 0735 01/30/24 0506 01/31/24 1025  NA 131* 130* 133* 129* 133*  K 3.9 4.0 4.1 4.0 4.5  CL 98 100 101 98 99  CO2 25 18* 22 19* 26  GLUCOSE 97 71 91 86 101*  BUN 17 17 16 15  7*  CREATININE 0.74 0.76 0.75 0.63 0.79  CALCIUM  8.6* 8.5* 8.2* 8.3* 8.4*  MG  --   --   --   --  2.1   GFR: Estimated Creatinine Clearance: 70 mL/min (by C-G formula based on SCr of 0.79 mg/dL). Liver Function Tests: Recent Labs  Lab 01/26/24 1247 01/27/24 0641 01/28/24 0735  AST 18 19 17   ALT 17 15 15   ALKPHOS 60 56 56  BILITOT 1.3* 1.6* 1.2  PROT 6.3* 6.9 5.5*  ALBUMIN 3.0* 2.8*  2.5*   Recent Labs  Lab 01/26/24 1247  LIPASE 28   No results for input(s): "AMMONIA" in the last 168 hours. Coagulation Profile: No results for input(s): "INR", "PROTIME" in the last 168 hours. Cardiac Enzymes: No results for input(s): "CKTOTAL", "CKMB", "CKMBINDEX", "TROPONINI" in the last 168 hours. BNP (last 3 results) No results for input(s): "PROBNP" in the last 8760 hours. HbA1C: No results for input(s): "HGBA1C" in the last 72 hours. CBG: No results for input(s): "GLUCAP" in the last 168 hours. Lipid Profile: No results for input(s): "CHOL", "HDL", "LDLCALC", "TRIG", "CHOLHDL", "LDLDIRECT" in the last 72 hours. Thyroid  Function Tests: No results for input(s): "TSH", "T4TOTAL", "FREET4", "T3FREE", "THYROIDAB" in the last 72 hours. Anemia Panel: No results for input(s): "VITAMINB12", "FOLATE", "FERRITIN", "TIBC", "IRON", "RETICCTPCT" in the last 72 hours. Sepsis Labs: Recent Labs  Lab 01/26/24 1547  LATICACIDVEN 1.1    Recent Results (from the past 240 hours)  Culture, blood (routine x 2)  Status: None   Collection Time: 01/26/24  3:15 PM   Specimen: BLOOD RIGHT FOREARM  Result Value Ref Range Status   Specimen Description BLOOD RIGHT FOREARM  Final   Special Requests   Final    BOTTLES DRAWN AEROBIC AND ANAEROBIC Blood Culture adequate volume   Culture   Final    NO GROWTH 5 DAYS Performed at [facility], [address], [place], [place] [address]    Report Status 01/31/2024 FINAL  Final  Culture, blood (routine x 2)     Status: None   Collection Time: 01/26/24  3:35 PM   Specimen: BLOOD RIGHT WRIST  Result Value Ref Range Status   Specimen Description BLOOD RIGHT WRIST  Final   Special Requests   Final    BOTTLES DRAWN AEROBIC AND ANAEROBIC Blood Culture adequate volume   Culture   Final    NO GROWTH 5 DAYS Performed at [facility], [address], [place], [place] [address]    Report Status 01/31/2024 FINAL  Final          Radiology Studies: CT ABDOMEN PELVIS W CONTRAST Addendum Date: 01/29/2024 ADDENDUM REPORT: 01/29/2024 17:03 ADDENDUM: The possibility of a perforated sigmoid colon carcinoma needs to be considered. Electronically Signed   By: Catherin Closs M.D.   On: 01/29/2024 17:03   Result Date: 01/29/2024 CLINICAL DATA:  Follow-up perforated sigmoid diverticulitis with abscess. EXAM: CT ABDOMEN AND PELVIS WITH CONTRAST TECHNIQUE: Multidetector CT imaging of the abdomen and pelvis was performed using the standard protocol following bolus administration of intravenous contrast. RADIATION DOSE REDUCTION: This exam was performed according to the departmental dose-optimization program which includes automated exposure control, adjustment of the mA and/or kV according to patient size and/or use of iterative reconstruction technique. CONTRAST:  75mL OMNIPAQUE  IOHEXOL  350 MG/ML SOLN COMPARISON:  01/26/2024 FINDINGS: Lower chest: Normal sized heart.  Minimal left basilar atelectasis. Hepatobiliary: Multiple small liver cysts. Unremarkable gallbladder. Pancreas: Unremarkable. No pancreatic ductal dilatation or surrounding inflammatory changes. Spleen: Normal in size without focal abnormality. Adrenals/Urinary Tract: Normal-appearing adrenal glands. Small bilateral simple appearing renal cysts. These do not need imaging follow-up. Unremarkable ureters and urinary bladder. Stomach/Bowel: Previously demonstrated segmental wall thickening involving the mid to distal sigmoid colon with 2 adjacent fluid collections, 1 containing gas. There is rim enhancement surrounding the more inferior collection on the left. This collection measures 4.6 x 3.9 cm on image number 59/3, previously 4.4 x 2.9 cm. The more superior collection has partial surrounding rim enhancement continues to cane chain some gas. This measures 5.4 x 3.6 cm on image number 54/3 and previously measured 5.4 x 3.9 cm. No new fluid collections. No free peritoneal air.  Unremarkable stomach and small bowel.  Surgically absent appendix. Vascular/Lymphatic: Atheromatous arterial calcifications without aneurysm. No enlarged lymph nodes. Reproductive: Moderately enlarged prostate gland. Other: Small bilateral inguinal hernias containing fat. Musculoskeletal: Lumbar spine postsurgical changes with fixation hardware and interbody fusion at the L4-5 level. Lumbar and lower thoracic spine degenerative changes. IMPRESSION: 1. Persistent findings of perforated sigmoid diverticulitis with 2 adjacent fluid collections, 1 containing gas. The more inferior collection has slightly increased in size and has thin surrounding rim enhancement, suspicious for abscess. The more superior collection containing some gas is stable in size. 2. No new fluid collections. 3. Moderately enlarged prostate gland. Electronically Signed: By: Catherin Closs M.D. On: 01/29/2024 16:31    Scheduled Meds:  Chlorhexidine  Gluconate Cloth  6 each Topical Once   feeding supplement  1 Container Oral TID BM   losartan   50 mg Oral Daily   metoprolol  succinate  12.5 mg Oral Daily   neomycin   1,000 mg Oral 3 times per day   And   metroNIDAZOLE  1,000 mg Oral 3 times per day   polyethylene glycol  17 g Oral Daily   rosuvastatin   20 mg Oral Daily   sodium chloride  flush  3 mL Intravenous Q12H   Continuous Infusions:  heparin  1,550 Units/hr (01/30/24 2336)   piperacillin -tazobactam (ZOSYN )  IV 3.375 g (01/31/24 0432)     LOS: 5 days    Time spent: 39 min Barbee Lew, MD  01/31/2024, 1:58 PM

## 2024-01-31 NOTE — Progress Notes (Signed)
 Patient ID: [name] Caro., male   DOB: Feb 21, 1946, 78 y.o.   MRN: [id number]   Acute Care Surgery Service Progress Note:    Chief Complaint/Subjective: Soreness in b/l lower back/upper buttock area No n/v.  Ok to proceed with surgery but getting some paperwork straightened out with is son today  Objective: Vital signs in last 24 hours: Temp:  [97.9 F (36.6 C)-98.8 F (37.1 C)] 98 F (36.7 C) (05/18 1145) Pulse Rate:  [66-78] 68 (05/18 1145) Resp:  [16-18] 18 (05/18 1145) BP: (103-121)/(48-73) 119/63 (05/18 1145) SpO2:  [79 %-100 %] 100 % (05/18 1145) Last BM Date : 01/30/24  Intake/Output from previous day: No intake/output data recorded. Intake/Output this shift: No intake/output data recorded.  Lungs: nonlabored  Cardiovascular: reg  Abd: soft, nt, nd  Extremities: no edema, +SCDs  Neuro: alert, nonfocal  Lab Results: CBC  Recent Labs    01/30/24 1918 01/31/24 1025  WBC 11.8* 9.9  HGB 11.2* 11.5*  HCT 32.6* 34.6*  PLT 255 290   BMET Recent Labs    01/30/24 0506 01/31/24 1025  NA 129* 133*  K 4.0 4.5  CL 98 99  CO2 19* 26  GLUCOSE 86 101*  BUN 15 7*  CREATININE 0.63 0.79  CALCIUM  8.3* 8.4*   LFT    Latest Ref Rng & Units 01/28/2024    7:35 AM 01/27/2024    6:41 AM 01/26/2024   12:47 PM  Hepatic Function  Total Protein 6.5 - 8.1 g/dL 5.5  6.9  6.3   Albumin 3.5 - 5.0 g/dL 2.5  2.8  3.0   AST 15 - 41 U/L 17  19  18    ALT 0 - 44 U/L 15  15  17    Alk Phosphatase 38 - 126 U/L 56  56  60   Total Bilirubin 0.0 - 1.2 mg/dL 1.2  1.6  1.3    PT/INR No results for input(s): "LABPROT", "INR" in the last 72 hours. ABG No results for input(s): "PHART", "HCO3" in the last 72 hours.  Invalid input(s): "PCO2", "PO2"  Studies/Results:  Anti-infectives: Anti-infectives (From admission, onward)    Start     Dose/Rate Route Frequency Ordered Stop   02/01/24 0600  cefoTEtan (CEFOTAN) 2 g in sodium chloride  0.9 % 100 mL IVPB  Status:  Discontinued         2 g 200 mL/hr over 30 Minutes Intravenous On call to O.R. 01/31/24 1144 01/31/24 1145   01/31/24 1400  neomycin  (MYCIFRADIN ) tablet 1,000 mg       Placed in "And" Linked Group   1,000 mg Oral 3 times per day 01/31/24 1144 02/01/24 1359   01/31/24 1400  metroNIDAZOLE (FLAGYL) tablet 1,000 mg       Placed in "And" Linked Group   1,000 mg Oral 3 times per day 01/31/24 1144 02/01/24 1359   01/26/24 2200  piperacillin -tazobactam (ZOSYN ) IVPB 3.375 g       Placed in "Followed by" Linked Group   3.375 g 12.5 mL/hr over 240 Minutes Intravenous Every 8 hours 01/26/24 1518     01/26/24 1530  piperacillin -tazobactam (ZOSYN ) IVPB 3.375 g       Placed in "Followed by" Linked Group   3.375 g 100 mL/hr over 30 Minutes Intravenous  Once 01/26/24 1518 01/26/24 1621       Medications: Scheduled Meds:  Chlorhexidine  Gluconate Cloth  6 each Topical Once   feeding supplement  1 Container Oral TID BM   losartan   50 mg Oral Daily   metoprolol  succinate  12.5 mg Oral Daily   neomycin   1,000 mg Oral 3 times per day   And   metroNIDAZOLE  1,000 mg Oral 3 times per day   polyethylene glycol  17 g Oral Daily   rosuvastatin   20 mg Oral Daily   sodium chloride  flush  3 mL Intravenous Q12H   Continuous Infusions:  heparin  1,550 Units/hr (01/30/24 2336)   piperacillin -tazobactam (ZOSYN )  IV 3.375 g (01/31/24 0432)   PRN Meds:.acetaminophen  **OR** acetaminophen , albuterol , HYDROcodone -acetaminophen , HYDROmorphone  (DILAUDID ) injection  Assessment/Plan: Patient Active Problem List   Diagnosis Date Noted   Acute diverticulitis 01/26/2024   Perforation and abscess of large intestine concurrent with and due to diverticulitis 01/26/2024   Seasonal allergic rhinitis due to pollen 01/08/2024   PVC's (premature ventricular contractions) 07/16/2022   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) 07/09/2022   Vitamin D  deficiency 07/09/2022   Primary hypertension 10/22/2021   Status post coronary artery  stent placement    HFrEF (heart failure with reduced ejection fraction) (HCC)    Surgical defect of lip 03/13/2020   History of non-ST elevation myocardial infarction (NSTEMI)    Spondylolisthesis at L4-L5 level 06/08/2017   CAD (coronary artery disease) 05/08/2017   Mixed hyperlipidemia 05/08/2017   Erectile dysfunction 03/29/2013   Sigmoid diverticulitis with perforation and abscess  - CTA abdomen pelvis shows circumferential wall thickening of the mid sigmoid colon with contained perforation (6.1 x 4.0 x 6.0 cm).  - IR note 5/13 reports there is no window for percutaneous drain placement.  - HDS without fever, tachycardia or hypotension. WBC normal - No peritonitis on exam. No current indication for emergency surgery - Cont IV abx - FLD - repeat CT 5/16 - persistent perforated presumed diverticulitis with 2 abscesses- one of which slightly increased, superior collection stable with gas.  IR unable to drain. - at this time, patient has failed conservative management given ongoing pain and fluid collections not amenable to intervention. -FLD today, NPO p MN with tentative plans for Hartmann's procedure tomorrow.   FEN - FLD, IVF per primary; hyponatremia, improved - per TRH VTE - SCDs, heparin  gtt, will need to stop this prior to surgery ID - Zosyn    LV thrombus? PAD HFrEF - has been seen by cardiology while here CAD HLD HTN Remote H/O polio    I reviewed nursing notes, hospitalist notes, last 24 h vitals and pain scores, last 48 h intake and output, last 24 h labs and trends, and last 24 h imaging results.  Disposition: review CT with IR  LOS: 5 days    [name]. [name], MD, FACS General, Bariatric, & Minimally Invasive Surgery [phone] [facility], [facility]

## 2024-01-31 NOTE — Plan of Care (Signed)

## 2024-01-31 NOTE — Progress Notes (Signed)
 PHARMACY - ANTICOAGULATION CONSULT NOTE  Pharmacy Consult for heparin  Indication: High risk for LV thrombus  Allergies  Allergen Reactions   Carvedilol  Diarrhea   Lipitor [Atorvastatin] Other (See Comments)    "Makes pt feel funky"   Plavix  [Clopidogrel ] Rash    Patient Measurements: Height: 5' 6.5" (168.9 cm) Weight: 67.1 kg (148 lb) IBW/kg (Calculated) : 64.95 HEPARIN  DW (KG): 67.1  Vital Signs: Temp: 98.1 F (36.7 C) (05/18 0823) Temp Source: Oral (05/18 0823) BP: 121/73 (05/18 0823) Pulse Rate: 69 (05/18 0823)  Labs: Recent Labs    01/29/24 2144 01/30/24 0506 01/30/24 1918 01/31/24 1025  HGB  --  12.0* 11.2* 11.5*  HCT  --  34.9* 32.6* 34.6*  PLT  --  198 255 290  HEPARINUNFRC 0.23* 0.29*  --  0.36  CREATININE  --  0.63  --  0.79    Estimated Creatinine Clearance: 70 mL/min (by C-G formula based on SCr of 0.79 mg/dL).   Medical History: Past Medical History:  Diagnosis Date   Coronary artery disease    Erectile dysfunction    HFrEF (heart failure with reduced ejection fraction) (HCC)    Echocardiogram 07/14/21:  EF 25, global HK, inf HK worse, Gr 2 DD, normal RVSF, mild to mod MR, AV sclerosis w/o AS Echocardiogram 2/23: EF 25-30, global HK, GLS -12.9, normal RVSF, mild to mod MR, AV sclerosis w/o AS    History of hiatal hernia    a lone time ago   Hyperlipidemia    [place] fracture [facility]) 03/22/2020   Myocardial infarction (HCC)    x 2   Polio       Assessment: 78 yo male with sigmoid diverticulitis with perforation and abscess. Echo results today show high risk LV thrombus and pharmacy consulted to dose heparin . No anticoagulants noted PTA.   Heparin  level therapeutic  Goal of Therapy:  Heparin  level 0.3-0.7 units/ml Monitor platelets by anticoagulation protocol: Yes   Plan:  Heparin  at 1550 units / hr Daily heparin  level, CBC F/u surgical plans as applicable  Thank you Lennice Quivers, PharmD 01/31/2024 11:33 AM

## 2024-02-01 ENCOUNTER — Inpatient Hospital Stay (HOSPITAL_COMMUNITY)

## 2024-02-01 ENCOUNTER — Encounter (HOSPITAL_COMMUNITY): Payer: Self-pay | Admitting: Internal Medicine

## 2024-02-01 ENCOUNTER — Encounter (HOSPITAL_COMMUNITY)
Admission: EM | Disposition: A | Discharge status: Home Health | Payer: Self-pay | Source: Home / Self Care | Attending: Internal Medicine

## 2024-02-01 DIAGNOSIS — Z87891 Personal history of nicotine dependence: Secondary | ICD-10-CM

## 2024-02-01 DIAGNOSIS — K5792 Diverticulitis of intestine, part unspecified, without perforation or abscess without bleeding: Secondary | ICD-10-CM | POA: Diagnosis not present

## 2024-02-01 DIAGNOSIS — K56609 Unspecified intestinal obstruction, unspecified as to partial versus complete obstruction: Secondary | ICD-10-CM

## 2024-02-01 DIAGNOSIS — I2510 Atherosclerotic heart disease of native coronary artery without angina pectoris: Secondary | ICD-10-CM | POA: Diagnosis not present

## 2024-02-01 DIAGNOSIS — I10 Essential (primary) hypertension: Secondary | ICD-10-CM

## 2024-02-01 HISTORY — PX: COLOSTOMY: SHX63

## 2024-02-01 HISTORY — PX: PARTIAL COLECTOMY: SHX5273

## 2024-02-01 HISTORY — PX: LAPAROTOMY: SHX154

## 2024-02-01 LAB — BASIC METABOLIC PANEL WITH GFR
Anion gap: 9 (ref 5–15)
BUN: 12 mg/dL (ref 8–23)
CO2: 21 mmol/L — ABNORMAL LOW (ref 22–32)
Calcium: 8.5 mg/dL — ABNORMAL LOW (ref 8.9–10.3)
Chloride: 101 mmol/L (ref 98–111)
Creatinine, Ser: 0.77 mg/dL (ref 0.61–1.24)
GFR, Estimated: >60 mL/min (ref >60)
Glucose, Bld: 88 mg/dL (ref 70–99)
Potassium: 4.1 mmol/L (ref 3.5–5.1)
Sodium: 131 mmol/L — ABNORMAL LOW (ref 135–145)

## 2024-02-01 LAB — CBC
HCT: 34.8 % — ABNORMAL LOW (ref 39.0–52.0)
Hemoglobin: 11.5 g/dL — ABNORMAL LOW (ref 13.0–17.0)
MCH: 28.8 pg (ref 26.0–34.0)
MCHC: 33 g/dL (ref 30.0–36.0)
MCV: 87.2 fL (ref 80.0–100.0)
Platelets: 261 10*3/uL (ref 150–400)
RBC: 3.99 MIL/uL — ABNORMAL LOW (ref 4.22–5.81)
RDW: 13.4 % (ref 11.5–15.5)
WBC: 9.9 10*3/uL (ref 4.0–10.5)
nRBC: 0 % (ref 0.0–0.2)

## 2024-02-01 LAB — HEPARIN LEVEL (UNFRACTIONATED): Heparin Unfractionated: 0.23 [IU]/mL — ABNORMAL LOW (ref 0.30–0.70)

## 2024-02-01 LAB — SURGICAL PCR SCREEN
MRSA, PCR: NEGATIVE
Staphylococcus aureus: POSITIVE — AB

## 2024-02-01 SURGERY — LAPAROTOMY, EXPLORATORY
Anesthesia: General | Site: Abdomen

## 2024-02-01 MED ORDER — FENTANYL CITRATE (PF) 100 MCG/2ML IJ SOLN
INTRAMUSCULAR | Status: AC
  Filled 2024-02-01: qty 2

## 2024-02-01 MED ORDER — ONDANSETRON HCL 4 MG/2ML IJ SOLN
INTRAMUSCULAR | Status: AC
  Filled 2024-02-01: qty 2

## 2024-02-01 MED ORDER — ORAL CARE MOUTH RINSE: 15.0000 mL | Freq: Once | OROMUCOSAL | Status: AC

## 2024-02-01 MED ORDER — LABETALOL HCL 5 MG/ML IV SOLN
INTRAVENOUS | Status: DC | PRN
  Administered 2024-02-01: 5 mg via INTRAVENOUS

## 2024-02-01 MED ORDER — ROCURONIUM BROMIDE 10 MG/ML (PF) SYRINGE
PREFILLED_SYRINGE | INTRAVENOUS | Status: DC | PRN
  Administered 2024-02-01: 60 mg via INTRAVENOUS
  Administered 2024-02-01: 20 mg via INTRAVENOUS

## 2024-02-01 MED ORDER — FENTANYL CITRATE (PF) 100 MCG/2ML IJ SOLN
25.0000 ug | INTRAMUSCULAR | Status: DC | PRN
Start: 2024-02-01 — End: 2024-02-01
  Administered 2024-02-01 (×3): 50 ug via INTRAVENOUS

## 2024-02-01 MED ORDER — CEFAZOLIN SODIUM 1 G IJ SOLR
INTRAMUSCULAR | Status: AC
  Filled 2024-02-01: qty 20

## 2024-02-01 MED ORDER — CHLORHEXIDINE GLUCONATE 0.12 % MT SOLN
OROMUCOSAL | Status: AC
  Administered 2024-02-01: 15 mL via OROMUCOSAL
  Filled 2024-02-01: qty 15

## 2024-02-01 MED ORDER — ONDANSETRON HCL 4 MG/2ML IJ SOLN
INTRAMUSCULAR | Status: DC | PRN
  Administered 2024-02-01: 4 mg via INTRAVENOUS

## 2024-02-01 MED ORDER — CHLORHEXIDINE GLUCONATE 0.12 % MT SOLN: 15.0000 mL | Freq: Once | OROMUCOSAL | Status: AC

## 2024-02-01 MED ORDER — HYDROMORPHONE HCL 1 MG/ML IJ SOLN
0.2500 mg | INTRAMUSCULAR | Status: DC | PRN
  Administered 2024-02-01 (×3): 0.5 mg via INTRAVENOUS

## 2024-02-01 MED ORDER — HYDROMORPHONE HCL 1 MG/ML IJ SOLN
INTRAMUSCULAR | Status: AC
  Filled 2024-02-01: qty 1

## 2024-02-01 MED ORDER — SUCCINYLCHOLINE CHLORIDE 200 MG/10ML IV SOSY
PREFILLED_SYRINGE | INTRAVENOUS | Status: DC | PRN
  Administered 2024-02-01: 140 mg via INTRAVENOUS

## 2024-02-01 MED ORDER — LACTATED RINGERS IV SOLN: INTRAVENOUS | Status: DC

## 2024-02-01 MED ORDER — OXYCODONE HCL 5 MG PO TABS
5.0000 mg | ORAL_TABLET | ORAL | Status: DC | PRN
  Administered 2024-02-01 – 2024-02-02 (×3): 10 mg via ORAL
  Administered 2024-02-02: 5 mg via ORAL
  Administered 2024-02-02 – 2024-02-04 (×6): 10 mg via ORAL
  Administered 2024-02-04: 5 mg via ORAL
  Administered 2024-02-04 – 2024-02-05 (×3): 10 mg via ORAL
  Administered 2024-02-06 – 2024-02-07 (×2): 5 mg via ORAL
  Filled 2024-02-01 (×4): qty 2
  Filled 2024-02-01: qty 1
  Filled 2024-02-01 (×11): qty 2

## 2024-02-01 MED ORDER — DEXAMETHASONE SODIUM PHOSPHATE 10 MG/ML IJ SOLN
INTRAMUSCULAR | Status: DC | PRN
  Administered 2024-02-01: 5 mg via INTRAVENOUS

## 2024-02-01 MED ORDER — CEFAZOLIN SODIUM-DEXTROSE 2-3 GM-%(50ML) IV SOLR
INTRAVENOUS | Status: DC | PRN
  Administered 2024-02-01: 2 g via INTRAVENOUS

## 2024-02-01 MED ORDER — PHENYLEPHRINE HCL-NACL 20-0.9 MG/250ML-% IV SOLN
INTRAVENOUS | Status: DC | PRN
  Administered 2024-02-01: 40 ug/min via INTRAVENOUS

## 2024-02-01 MED ORDER — PROPOFOL 10 MG/ML IV BOLUS
INTRAVENOUS | Status: AC
  Filled 2024-02-01: qty 20

## 2024-02-01 MED ORDER — FENTANYL CITRATE (PF) 100 MCG/2ML IJ SOLN
50.0000 ug | Freq: Once | INTRAMUSCULAR | Status: AC
  Administered 2024-02-01: 50 ug via INTRAVENOUS

## 2024-02-01 MED ORDER — FENTANYL CITRATE (PF) 250 MCG/5ML IJ SOLN
INTRAMUSCULAR | Status: AC
  Filled 2024-02-01: qty 5

## 2024-02-01 MED ORDER — ACETAMINOPHEN 500 MG PO TABS
1000.0000 mg | ORAL_TABLET | Freq: Four times a day (QID) | ORAL | Status: DC
  Administered 2024-02-01 – 2024-02-09 (×23): 1000 mg via ORAL
  Filled 2024-02-01 (×29): qty 2

## 2024-02-01 MED ORDER — 0.9 % SODIUM CHLORIDE (POUR BTL) OPTIME
TOPICAL | Status: DC | PRN
  Administered 2024-02-01: 2000 mL

## 2024-02-01 MED ORDER — LIDOCAINE 2% (20 MG/ML) 5 ML SYRINGE
INTRAMUSCULAR | Status: DC | PRN
  Administered 2024-02-01: 40 mg via INTRAVENOUS

## 2024-02-01 MED ORDER — DROPERIDOL 2.5 MG/ML IJ SOLN: 0.6250 mg | Freq: Once | INTRAMUSCULAR | Status: DC | PRN

## 2024-02-01 MED ORDER — PHENYLEPHRINE 80 MCG/ML (10ML) SYRINGE FOR IV PUSH (FOR BLOOD PRESSURE SUPPORT)
PREFILLED_SYRINGE | INTRAVENOUS | Status: DC | PRN
  Administered 2024-02-01: 40 ug via INTRAVENOUS

## 2024-02-01 MED ORDER — HEPARIN (PORCINE) 25000 UT/250ML-% IV SOLN
1600.0000 [IU]/h | INTRAVENOUS | Status: DC
  Administered 2024-02-02 – 2024-02-03 (×4): 1550 [IU]/h via INTRAVENOUS
  Administered 2024-02-04 – 2024-02-05 (×2): 1600 [IU]/h via INTRAVENOUS
  Filled 2024-02-01 (×6): qty 250

## 2024-02-01 MED ORDER — PIPERACILLIN-TAZOBACTAM 3.375 G IVPB
3.3750 g | Freq: Three times a day (TID) | INTRAVENOUS | Status: AC
  Administered 2024-02-01 – 2024-02-06 (×15): 3.375 g via INTRAVENOUS
  Filled 2024-02-01 (×16): qty 50

## 2024-02-01 MED ORDER — PROPOFOL 10 MG/ML IV BOLUS
INTRAVENOUS | Status: DC | PRN
  Administered 2024-02-01: 110 mg via INTRAVENOUS

## 2024-02-01 MED ORDER — SUGAMMADEX SODIUM 200 MG/2ML IV SOLN
INTRAVENOUS | Status: DC | PRN
  Administered 2024-02-01: 134.2 mg via INTRAVENOUS

## 2024-02-01 MED ORDER — FENTANYL CITRATE (PF) 250 MCG/5ML IJ SOLN
INTRAMUSCULAR | Status: DC | PRN
  Administered 2024-02-01 (×3): 50 ug via INTRAVENOUS
  Administered 2024-02-01: 100 ug via INTRAVENOUS

## 2024-02-01 SURGICAL SUPPLY — 48 items
BAG COUNTER SPONGE SURGICOUNT (BAG) ×3 IMPLANT
BLADE CLIPPER SURG (BLADE) IMPLANT
CANISTER SUCTION 3000ML PPV (SUCTIONS) ×3 IMPLANT
CHLORAPREP W/TINT 26 (MISCELLANEOUS) ×3 IMPLANT
COVER SURGICAL LIGHT HANDLE (MISCELLANEOUS) ×3 IMPLANT
DRAIN CHANNEL 19F RND (DRAIN) IMPLANT
DRAPE INCISE IOBAN 66X45 STRL (DRAPES) IMPLANT
DRAPE LAPAROSCOPIC ABDOMINAL (DRAPES) ×3 IMPLANT
DRAPE WARM FLUID 44X44 (DRAPES) ×3 IMPLANT
DRSG OPSITE POSTOP 4X10 (GAUZE/BANDAGES/DRESSINGS) IMPLANT
DRSG OPSITE POSTOP 4X8 (GAUZE/BANDAGES/DRESSINGS) IMPLANT
DRSG TEGADERM 4X10 (GAUZE/BANDAGES/DRESSINGS) IMPLANT
ELECT BLADE 6.5 EXT (BLADE) IMPLANT
ELECT CAUTERY BLADE 6.4 (BLADE) ×3 IMPLANT
ELECTRODE REM PT RTRN 9FT ADLT (ELECTROSURGICAL) ×3 IMPLANT
EVACUATOR SILICONE 100CC (DRAIN) IMPLANT
GLOVE BIO SURGEON STRL SZ7 (GLOVE) ×3 IMPLANT
GLOVE BIOGEL PI IND STRL 7.5 (GLOVE) ×3 IMPLANT
GOWN STRL REUS W/ TWL LRG LVL3 (GOWN DISPOSABLE) ×6 IMPLANT
GOWN STRL REUS W/ TWL XL LVL3 (GOWN DISPOSABLE) ×3 IMPLANT
HANDLE SUCTION POOLE (INSTRUMENTS) ×3 IMPLANT
KIT BASIN OR (CUSTOM PROCEDURE TRAY) ×3 IMPLANT
KIT OSTOMY DRAINABLE 2.75 STR (WOUND CARE) IMPLANT
KIT TURNOVER KIT B (KITS) ×3 IMPLANT
LIGASURE IMPACT 36 18CM CVD LR (INSTRUMENTS) IMPLANT
NS IRRIG 1000ML POUR BTL (IV SOLUTION) ×6 IMPLANT
PACK GENERAL/GYN (CUSTOM PROCEDURE TRAY) ×3 IMPLANT
PAD ARMBOARD POSITIONER FOAM (MISCELLANEOUS) ×3 IMPLANT
RELOAD GRN CONTOUR (ENDOMECHANICALS) ×6 IMPLANT
RELOAD STAPLE 40 GRN THCK (ENDOMECHANICALS) IMPLANT
SPONGE T-LAP 18X18 ~~LOC~~+RFID (SPONGE) IMPLANT
STAPLER CVD CUT GN 40 RELOAD (ENDOMECHANICALS) ×3 IMPLANT
STAPLER CVD CUT GRN 40 RELOAD (ENDOMECHANICALS) IMPLANT
STAPLER SKIN PROX WIDE 3.9 (STAPLE) IMPLANT
STAPLER VISISTAT 35W (STAPLE) IMPLANT
SUT ETHILON 2 0 FS 18 (SUTURE) IMPLANT
SUT PDS AB 1 CTX 36 (SUTURE) IMPLANT
SUT PDS AB 1 TP1 96 (SUTURE) ×6 IMPLANT
SUT PROLENE 3 0 SH 48 (SUTURE) IMPLANT
SUT SILK 2 0 SH CR/8 (SUTURE) ×3 IMPLANT
SUT SILK 2 0 TIES 10X30 (SUTURE) ×3 IMPLANT
SUT SILK 3 0 SH CR/8 (SUTURE) ×3 IMPLANT
SUT SILK 3 0 TIES 10X30 (SUTURE) ×3 IMPLANT
SUT VIC AB 3-0 SH 18 (SUTURE) IMPLANT
TOWEL GREEN STERILE (TOWEL DISPOSABLE) ×3 IMPLANT
TRAY FOLEY MTR SLVR 16FR STAT (SET/KITS/TRAYS/PACK) IMPLANT
WATER STERILE IRR 1000ML POUR (IV SOLUTION) IMPLANT
YANKAUER SUCT BULB TIP NO VENT (SUCTIONS) IMPLANT

## 2024-02-01 NOTE — Consult Note (Addendum)
 WOC consult received for new ostomy. Patient was marked preoperatively by [facility] RN. WOC team will follow for ostomy education and support.   Thank you,    Ronni Colace MSN, RN-BC, [employer] [phone]

## 2024-02-01 NOTE — Anesthesia Preprocedure Evaluation (Addendum)
 Anesthesia Evaluation    Reviewed: Allergy & Precautions, Patient's Chart, lab work & pertinent test results  History of Anesthesia Complications Negative for: history of anesthetic complications  Airway Mallampati: II  TM Distance: >3 FB Neck ROM: Full    Dental no notable dental hx.    Pulmonary former smoker   Pulmonary exam normal breath sounds clear to auscultation       Cardiovascular hypertension, Pt. on medications + CAD, + Past MI and + Cardiac Stents (2022)  Normal cardiovascular exam Rhythm:Regular Rate:Normal  TTE 01/28/24: 1. There is swirling of definity  contrast in the apex with no obvious LV thrombus. Findings c/w sluggish flow in the apex. Cannot rule out early forming thrombus. . Left ventricular ejection fraction, by estimation, is 25 to 30%. The left ventricle has severely decreased function. The left ventricle demonstrates global hypokinesis. There is mild left ventricular hypertrophy of the infero-lateral segment. Left ventricular diastolic function could not be evaluated.   2. Right ventricular systolic function is mildly reduced. The right ventricular size is normal. Tricuspid regurgitation signal is inadequate for assessing PA pressure.   3. The mitral valve is degenerative. Trivial mitral valve regurgitation. No evidence of mitral stenosis.   4. The aortic valve is tricuspid. Aortic valve regurgitation is not visualized. Aortic valve sclerosis/calcification is present, without any evidence of aortic stenosis. Aortic valve area, by VTI measures 2.91 cm. Aortic valve mean gradient measures 2.0 mmHg. Aortic valve Vmax measures 1.01 m/s.   5. The inferior vena cava is normal in size with greater than 50% respiratory variability, suggesting right atrial pressure of 3 mmHg.      Neuro/Psych negative neurological ROS     GI/Hepatic Neg liver ROS,,,SBO   Endo/Other  negative endocrine ROS    Renal/GU negative  Renal ROS     Musculoskeletal negative musculoskeletal ROS (+)    Abdominal   Peds  Hematology  (+) Blood dyscrasia (Hgb 11.5), anemia   Anesthesia Other Findings Day of surgery medications reviewed with patient.  Reproductive/Obstetrics                             Anesthesia Physical Anesthesia Plan  ASA: 4  Anesthesia Plan: General   Post-op Pain Management: Ofirmev  IV (intra-op)*   Induction: Intravenous and Rapid sequence  PONV Risk Score and Plan: 2 and Ondansetron , Dexamethasone  and Treatment may vary due to age or medical condition  Airway Management Planned: Oral ETT  Additional Equipment: Arterial line  Intra-op Plan:   Post-operative Plan: Extubation in OR  Informed Consent:   Plan Discussed with:   Anesthesia Plan Comments:         Anesthesia Quick Evaluation

## 2024-02-01 NOTE — Anesthesia Procedure Notes (Signed)
 Procedure Name: Intubation Date/Time: 02/01/2024 12:49 PM  Performed by: Annett Bart, CRNAPre-anesthesia Checklist: Patient identified, Emergency Drugs available, Suction available and Patient being monitored Patient Re-evaluated:Patient Re-evaluated prior to induction Oxygen Delivery Method: Circle system utilized Preoxygenation: Pre-oxygenation with 100% oxygen Induction Type: IV induction Ventilation: Mask ventilation without difficulty Laryngoscope Size: Miller and 2 Grade View: Grade I Tube type: Oral Tube size: 7.5 mm Number of attempts: 1 Airway Equipment and Method: Stylet and Oral airway Placement Confirmation: ETT inserted through vocal cords under direct vision, positive ETCO2 and breath sounds checked- equal and bilateral Secured at: 22 cm Tube secured with: Tape Dental Injury: Teeth and Oropharynx as per pre-operative assessment

## 2024-02-01 NOTE — Progress Notes (Signed)
 Per secure chat from Michael Maczis, [place] heparin  was stopped at 7:49 in preparation for the OR today.

## 2024-02-01 NOTE — Progress Notes (Signed)
 PROGRESS NOTE    [name] [name].  QIO:[id number] DOB: 04/30/1946 [name]: 01/26/2024 PCP: Galvin Jules, FNP  Brief Narrative:  78 y.o. male with medical history significant of hypertension, hyperlipidemia, CAD status post stent, chronic systolic CHF presenting with worsening abdominal pain.   Patient reports abdominal pain for the past 5 days.  Due to its persistent nature he came to the ED to be evaluated.   Denies fevers, chills, chest pain, shortness of breath, constipation, diarrhea, nausea, vomiting.   ED Course: Vital signs in the ED notable for blood pressure in the 120s-140 systolic.  Lab workup included CMP with sodium 131, calcium  8.6, protein 6.3, albumin 3.0, T. bili 1.3.  CBC with leukocytosis to 13.5.  Troponin negative x 1 with repeat pending.  Lipase normal.  Lactic acid pending.  Urinalysis pending.  Blood cultures pending.  Chest x-ray showed no acute abnormality.    CTA of the chest abdomen pelvis showed no evidence of aneurysm, dissection, pulmonary embolism.  There was noted to be 50% stenosis of the SMA, high-grade stenosis at the left common iliac and left common femoral artery of 70-90% as well as a left complete occlusion of the left external iliac artery with distal reconstitution.  Further iliac atherosclerosis noted on the right with stenosis in the 50-70% range.  In addition colonic wall thickening with evidence of diverticulitis with diverticular abscess and contained colonic wall perforation also noted.  In addition there was bronchial thickening, retaining stool, small hernias and pulmonary nodules.   General surgery consulted in the ED and agree with antibiotics with possible IR intervention to drain this abscess.  They will follow along.  Patient started on Zosyn .   Assessment & Plan:   Principal Problem:   Acute diverticulitis Active Problems:   CAD (coronary artery disease)   Mixed hyperlipidemia   HFrEF (heart failure with reduced ejection fraction)  (HCC)   Status post coronary artery stent placement   Primary hypertension   Perforation and abscess of large intestine concurrent with and due to diverticulitis   Diverticulitis with contained perforation and abscess-patient admitted with abdominal pain for 5 days prior to admission to hospital.   CT showed diverticulitis with diverticular abscess and contained perforation with leukocytosis.   General surgery and IR consulted.  IR could not find a window to place a drain.  White count remains elevated.   Continue Zosyn .   General surgery following.  Plan to take him to the OR on 19 May as repeat CT scan still showed abscess and fluid collections.   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  On aspirin  and statin for his CAD. - Will need to follow-up with vascular surgery outpatient   Hypertension - Continue home losartan    Hyperlipidemia - Continue home rosuvastatin    CAD > Status post stents - Continue home ASA, rosuvastatin    Chronic systolic CHF > Last echo I can see was in 2023 with EF 25-30%, G1 DD, normal RV function - Not on a diuretic - Continue home meds -repeat Echo -left ventricular ejection fraction 25 to 30% severely decreased function and demonstrate global hypokinesis and mild LVH.  Sluggish flow at the apex cannot rule out early thrombus.  Right ventricular systolic function mild reduced Cardiology following signed off 5/16 On heparin  for concern for LV thrombus  Estimated body mass index is 23.53 kg/m as calculated from the following:   Height  as of this encounter: 5' 6.5" (1.689 m).   Weight as of this encounter: 67.1 kg.  DVT prophylaxis: scd Code Status: full Family Communication:none Disposition Plan:  Status is: Inpatient Remains inpatient appropriate because: acute illness   Consultants:  surgery  Procedures:none Antimicrobials: zosyn   Subjective:  Was very anxious  since poa papers were not done appreciate chaplain for getting it done prior to surgery  Objective: Vitals:   02/01/24 1143 02/01/24 1515 02/01/24 1530 02/01/24 1545  BP: 122/66 (!) 164/86 137/69 (!) 153/63  Pulse: 68 87 73 78  Resp: 18 15 17 15   Temp: 98.5 F (36.9 C) 98.3 F (36.8 C)    TempSrc: Oral     SpO2: 98% 90% 95% 95%  Weight: 67.1 kg     Height: 5' 6.5" (1.689 m)       Intake/Output Summary (Last 24 hours) at 02/01/2024 1554 Last data filed at 02/01/2024 1538 Gross per 24 hour  Intake 2447.25 ml  Output 247 ml  Net 2200.25 ml    Filed Weights   01/26/24 1245 02/01/24 1143  Weight: 67.1 kg 67.1 kg    Examination:  General exam: Appears nad Respiratory system: Clear to auscultation. Respiratory effort normal. Cardiovascular system: S1 & S2 heard, RRR. No JVD, murmurs, rubs, gallops or clicks. No pedal edema. Gastrointestinal system: Abdomen is nondistended, soft and supra pubic tender decreased compared to yesterday no organomegaly or masses felt. Normal bowel sounds heard. Central nervous system: Alert and oriented. No focal neurological deficits. Extremities: no edema   Data Reviewed: I have personally reviewed following labs and imaging studies  CBC: Recent Labs  Lab 01/29/24 1032 01/30/24 0506 01/30/24 1918 01/31/24 1025 02/01/24 0727  WBC 14.7* 11.7* 11.8* 9.9 9.9  HGB 13.4 12.0* 11.2* 11.5* 11.5*  HCT 39.7 34.9* 32.6* 34.6* 34.8*  MCV 85.6 84.3 83.8 85.4 87.2  PLT 318 198 255 290 261   Basic Metabolic Panel: Recent Labs  Lab 01/27/24 0641 01/28/24 0735 01/30/24 0506 01/31/24 1025 02/01/24 0727  NA 130* 133* 129* 133* 131*  K 4.0 4.1 4.0 4.5 4.1  CL 100 101 98 99 101  CO2 18* 22 19* 26 21*  GLUCOSE 71 91 86 101* 88  BUN 17 16 15  7* 12  CREATININE 0.76 0.75 0.63 0.79 0.77  CALCIUM  8.5* 8.2* 8.3* 8.4* 8.5*  MG  --   --   --  2.1  --    GFR: Estimated Creatinine Clearance: 70 mL/min (by C-G formula based on SCr of 0.77 mg/dL). Liver  Function Tests: Recent Labs  Lab 01/26/24 1247 01/27/24 0641 01/28/24 0735  AST 18 19 17   ALT 17 15 15   ALKPHOS 60 56 56  BILITOT 1.3* 1.6* 1.2  PROT 6.3* 6.9 5.5*  ALBUMIN 3.0* 2.8* 2.5*   Recent Labs  Lab 01/26/24 1247  LIPASE 28   No results for input(s): "AMMONIA" in the last 168 hours. Coagulation Profile: No results for input(s): "INR", "PROTIME" in the last 168 hours. Cardiac Enzymes: No results for input(s): "CKTOTAL", "CKMB", "CKMBINDEX", "TROPONINI" in the last 168 hours. BNP (last 3 results) No results for input(s): "PROBNP" in the last 8760 hours. HbA1C: No results for input(s): "HGBA1C" in the last 72 hours. CBG: No results for input(s): "GLUCAP" in the last 168 hours. Lipid Profile: No results for input(s): "CHOL", "HDL", "LDLCALC", "TRIG", "CHOLHDL", "LDLDIRECT" in the last 72 hours. Thyroid  Function Tests: No results for input(s): "TSH", "T4TOTAL", "FREET4", "T3FREE", "THYROIDAB" in the last 72 hours. Anemia Panel:  No results for input(s): "VITAMINB12", "FOLATE", "FERRITIN", "TIBC", "IRON", "RETICCTPCT" in the last 72 hours. Sepsis Labs: Recent Labs  Lab 01/26/24 1547  LATICACIDVEN 1.1    Recent Results (from the past 240 hours)  Culture, blood (routine x 2)     Status: None   Collection Time: 01/26/24  3:15 PM   Specimen: BLOOD RIGHT FOREARM  Result Value Ref Range Status   Specimen Description BLOOD RIGHT FOREARM  Final   Special Requests   Final    BOTTLES DRAWN AEROBIC AND ANAEROBIC Blood Culture adequate volume   Culture   Final    NO GROWTH 5 DAYS Performed at [facility], [address], [place], [place] [address]    Report Status 01/31/2024 FINAL  Final  Culture, blood (routine x 2)     Status: None   Collection Time: 01/26/24  3:35 PM   Specimen: BLOOD RIGHT WRIST  Result Value Ref Range Status   Specimen Description BLOOD RIGHT WRIST  Final   Special Requests   Final    BOTTLES DRAWN AEROBIC AND ANAEROBIC Blood Culture  adequate volume   Culture   Final    NO GROWTH 5 DAYS Performed at [facility], [address], [place], [place] [address]    Report Status 01/31/2024 FINAL  Final  Surgical pcr screen     Status: Abnormal   Collection Time: 02/01/24 11:48 AM   Specimen: Nasal Mucosa; Nasal Swab  Result Value Ref Range Status   MRSA, PCR NEGATIVE NEGATIVE Final   Staphylococcus aureus POSITIVE (A) NEGATIVE Final    Comment: (NOTE) The Xpert SA Assay (FDA approved for NASAL specimens in patients 44 years of age and older), is one component of a comprehensive surveillance program. It is not intended to diagnose infection nor to guide or monitor treatment. Performed at [facility], [address], [place], [place] [address]          Radiology Studies: No results found.   Scheduled Meds:  [MAR Hold] feeding supplement  1 Container Oral TID BM   [MAR Hold] losartan   50 mg Oral Daily   [MAR Hold] metoprolol  succinate  12.5 mg Oral Daily   [MAR Hold] polyethylene glycol  17 g Oral Daily   [MAR Hold] rosuvastatin   20 mg Oral Daily   [MAR Hold] sodium chloride  flush  3 mL Intravenous Q12H   Continuous Infusions:  lactated ringers  10 mL/hr at 02/01/24 1237   [MAR Hold] piperacillin -tazobactam (ZOSYN )  IV 3.375 g (02/01/24 0410)     LOS: 6 days    Time spent: 39 min Barbee Lew, MD  02/01/2024, 3:54 PM

## 2024-02-01 NOTE — Anesthesia Procedure Notes (Signed)
 Arterial Line Insertion Start/End5/19/2025 12:20 PM, 02/01/2024 12:30 PM Performed by: Annett Bart, CRNA, CRNA  Patient location: Pre-op. Preanesthetic checklist: patient identified, IV checked, site marked, risks and benefits discussed, surgical consent, monitors and equipment checked, pre-op evaluation, timeout performed and anesthesia consent Lidocaine  1% used for infiltration Right, radial was placed Catheter size: 20 G Hand hygiene performed , maximum sterile barriers used  and Seldinger technique used Allen's test indicative of satisfactory collateral circulation Attempts: 1 Procedure performed without using ultrasound guided technique. Following insertion, dressing applied and Biopatch. Post procedure assessment: normal  Patient tolerated the procedure well with no immediate complications.

## 2024-02-01 NOTE — Transfer of Care (Signed)
 Immediate Anesthesia Transfer of Care Note  Patient: [name] [name].  Procedure(s) Performed: LAPAROTOMY, EXPLORATORY CREATION, COLOSTOMY (Left: Abdomen) COLECTOMY, PARTIAL (Abdomen)  Patient Location: PACU  Anesthesia Type:General  Level of Consciousness: awake and alert   Airway & Oxygen Therapy: Patient Spontanous Breathing and Patient connected to nasal cannula oxygen  Post-op Assessment: Report given to RN and Post -op Vital signs reviewed and stable  Post vital signs: Reviewed and stable  Last Vitals:  Vitals Value Taken Time  BP 137/69 02/01/24 1530  Temp 36.8 C 02/01/24 1515  Pulse 77 02/01/24 1536  Resp 16 02/01/24 1536  SpO2 93 % 02/01/24 1536  Vitals shown include unfiled device data.  Last Pain:  Vitals:   02/01/24 1535  TempSrc:   PainSc: 10-Worst pain ever      Patients Stated Pain Goal: 3 (02/01/24 1134)  Complications: No notable events documented.

## 2024-02-01 NOTE — Op Note (Signed)
 02/01/2024  4:10 PM  PATIENT:  [name] [name].  78 y.o. male  Patient Care Team: Rakes, Georgeann Kindred, FNP as PCP - General (Family Medicine) Nahser, Lela Purple, MD as PCP - Cardiology (Cardiology)  PRE-OPERATIVE DIAGNOSIS:  Diverticulitis   POST-OPERATIVE DIAGNOSIS:  Adenocarcinoma  PROCEDURE:   Exploratory Laparotomy Sigmoidectomy   SURGEON:  Cannon Champion, MD  ASSISTANT: Lillette Reid, MD A surgeon assistant was necessary for the completion of this case given the complexity of the patient's clinical status and the difficult anatomy. Dr. Alethea Andes assisted with retraction, dissection, and identification of important structures throughout the case.   ANESTHESIA:   general  COUNTS:  Sponge, needle and instrument counts were reported correct x2 at the conclusion of the operation.  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  No obvious intra-peritoneal disease beyond the colon.   DISPOSITION: PACU in satisfactory condition  INDICATION: [name] [name] is a 78 yo male who presented to the ED with several days of abdominal pain. Imaging was concerning for diverticulitis and he was initially started on a non-operative pathway. His symptoms failed to resolve and repeat imaging showed a worsening of his abscess. There was no window for IR intervention, and I recommended proceeding to the OR for planned sigmoidectomy and end colostomy. All of his questions were addressed and written consent was obtained.  DESCRIPTION: The patient was identified in preop holding and taken to the OR where he was placed on the operating room table. SCDs were placed. General endotracheal anesthesia was induced without  difficulty. He was then prepped and draped in the usual sterile fashion. A surgical timeout was performed indicating the correct patient, procedure, positioning and need for preoperative antibiotics.   We began with a midline laparotomy using a #10 blade. This was carried through subcutaneous tissue using electrocautery. The linea alba was identified and scored, the fascia was elevated, and the abdomen was entered. There was no evidence of injury from entry.  I began by examining the colon. The proximal sigmoid colon appeared normal, however, as I moved distal I could feel that there was an inflammatory process within the distal sigmoid and upper rectal area.  The colon was adherent to the pelvic sidewalls, but we were able to bluntly dissect it free. As we were doing so, we encountered abscess cavities anterior and lateral to the colon.  We chose a proximal transection point within the normal appearing sigmoid. A mesenteric window was created and the curvilinear green load stapler was used to transect the bowel.  We then carefully moved distal along the colon, using a combination of blunt dissection and bipolar electrocautery to free the colon.  The ureter was not visualized, but we stayed close to the colon to minimize the chance of injury. A distal transection point was chosen on the colon. Another green load from the stapler was used to transect the colon, however, after the stapler was fired and the specimen excised it became evident that there was still diseased bowel present. We opened the bowel to examine it and noted what appeared to be a mass.  A biopsy of the tissue was sent for a frozen section and later came back as positive for  adenocarcinoma. We continued our dissection further distal until we found normal appearing colon. We were unable to get a stapler across the colon given how low it was in the pelvis. The remainder of the abnormal appearing bowel was excised with scissors and passed off as a  specimen. The lumen of the colon was examined and there was no gross evidence of persistent disease. The rectum was oversewn using running 3-0 prolene suture.  The pelvis was irrigated with several liters of saline until the effluent ran clear. We inspected for hemostasis.  A 19Fr blake drain was placed in the pelvis and brought out the RLQ of the abdomen.  We next turned toward creating our colostomy and closure. A ellipse of skin was excised at the previously marked location in the left abdomen. The subcutaneous tissues were divided. A cruciate incision was made in the anterior rectus sheath. The muscles were separated and the posterior sheath was divided. The proximal sigmoid colon was brought through the trephine without tension.  The fascia was then closed with running nonlooped #1 PDS.  The skin was loosely approximated with staples.  The staple line was excised from the sigmoid colon and passed off as a proximal margin. An end colostomy was then matured in standard fashion.  An ostomy appliance was placed.

## 2024-02-01 NOTE — Progress Notes (Addendum)
 This chaplain responded to consult for creating/updating the Pt. Advance Directive before today's procedure. Chaplain Resident Suanne Else joined the spiritual care visit. The chaplain provided AD: HCPOA and Living Will education with the Pt. The Pt. answered the chaplain's clarifying questions before calling the notary.  Today's Advance Directive supercedes any previous Advance Directive documentation.  The chaplain provided Pt. education on the hospital's policy of not completing Durable Power of Attorney in the hospital.  The Pt. named Wynter Grave as his health care agent. The Pt. completed a Living Will.  The chaplain is present with the Pt., notary and witnesses for the notarizing of the Pt. AD: HCPOA and LW.  The chaplain gave the Pt. the original AD document along with one copy. The chaplain scanned the Pt. AD into the Pt. EMR.  This chaplain is available for F/U spiritual care as needed.  Chaplain Kathleene Papas [phone]

## 2024-02-01 NOTE — Consult Note (Signed)
 WOC Nurse requested for preoperative stoma site marking  Discussed surgical procedure and stoma creation with patient. Explained role of the WOC nurse team.  Provided the patient with educational booklet and provided samples of pouching options.  Answered patient and family questions.   Examined patient lying, sitting, and standing in order to place the marking in the patient's visual field, away from any creases or abdominal contour issues and within the rectus muscle.  Attempted to mark below the patient's belt line.   Marked for colostomy in the LLQ 7 cm to the left of the umbilicus and 8 cm above/below the umbilicus.  Marked for ileostomy in the RLQ 7 cm to the right of the umbilicus and  7 cm above/below the umbilicus.   Patient's abdomen cleansed with CHG wipes at site markings, allowed to air dry prior to marking.Covered mark with thin film transparent dressing to preserve mark until date of surgery.   Thank-you,  Rachel Budds BSN, RN, [employer], WOC  (Pager: [phone])

## 2024-02-01 NOTE — Plan of Care (Signed)
  Problem: Education: Goal: Knowledge of General Education information will improve Description: Including pain rating scale, medication(s)/side effects and non-pharmacologic comfort measures Outcome: Progressing   Problem: Activity: Goal: Risk for activity intolerance will decrease Outcome: Progressing   Problem: Nutrition: Goal: Adequate nutrition will be maintained Outcome: Not Progressing   Problem: Elimination: Goal: Will not experience complications related to bowel motility Outcome: Progressing   Problem: Pain Managment: Goal: General experience of comfort will improve and/or be controlled Outcome: Not Progressing   Problem: Safety: Goal: Ability to remain free from injury will improve Outcome: Progressing

## 2024-02-01 NOTE — Progress Notes (Addendum)
 * Day of Surgery *  Subjective: CC: No abdominal pain today. No n/v. Liquid bm yesterday. Stable back pain.  Wants to see a Child psychotherapist before OR for paperwork.   Afebrile. No tachycardia. Soft SBP improved this am. WBC wnl.   Objective: Vital signs in last 24 hours: Temp:  [97.9 F (36.6 C)-98.1 F (36.7 C)] 97.9 F (36.6 C) (05/19 0412) Pulse Rate:  [63-87] 63 (05/19 0734) Resp:  [18-19] 18 (05/19 0412) BP: (97-131)/(51-79) 113/55 (05/19 0734) SpO2:  [98 %-100 %] 99 % (05/19 0734) Last BM Date : 01/30/24  Intake/Output from previous day: 05/18 0701 - 05/19 0700 In: 1397.3 [I.V.:986.3; IV Piggyback:410.9] Out: -  Intake/Output this shift: No intake/output data recorded.  PE: Gen:  Alert, NAD, pleasant Card:  Reg Pulm:  Rate and effort normal Abd: Soft, ND, NT, +BS  Lab Results:  Recent Labs    01/31/24 1025 02/01/24 0727  WBC 9.9 9.9  HGB 11.5* 11.5*  HCT 34.6* 34.8*  PLT 290 261   BMET Recent Labs    01/31/24 1025 02/01/24 0727  NA 133* 131*  K 4.5 4.1  CL 99 101  CO2 26 21*  GLUCOSE 101* 88  BUN 7* 12  CREATININE 0.79 0.77  CALCIUM  8.4* 8.5*   PT/INR No results for input(s): "LABPROT", "INR" in the last 72 hours. CMP     Component Value Date/Time   NA 131 (L) 02/01/2024 0727   NA 136 01/08/2024 1025   K 4.1 02/01/2024 0727   CL 101 02/01/2024 0727   CO2 21 (L) 02/01/2024 0727   GLUCOSE 88 02/01/2024 0727   BUN 12 02/01/2024 0727   BUN 11 01/08/2024 1025   CREATININE 0.77 02/01/2024 0727   CREATININE 0.93 03/29/2013 0923   CALCIUM  8.5 (L) 02/01/2024 0727   PROT 5.5 (L) 01/28/2024 0735   PROT 6.2 01/08/2024 1025   ALBUMIN 2.5 (L) 01/28/2024 0735   ALBUMIN 3.9 01/08/2024 1025   AST 17 01/28/2024 0735   ALT 15 01/28/2024 0735   ALKPHOS 56 01/28/2024 0735   BILITOT 1.2 01/28/2024 0735   BILITOT 0.8 01/08/2024 1025   GFRNONAA >60 02/01/2024 0727   GFRNONAA 85 03/29/2013 0923   GFRAA 104 05/01/2020 1031   GFRAA >89 03/29/2013  0923   Lipase     Component Value Date/Time   LIPASE 28 01/26/2024 1247    Studies/Results: No results found.  Anti-infectives: Anti-infectives (From admission, onward)    Start     Dose/Rate Route Frequency Ordered Stop   02/01/24 0600  cefoTEtan (CEFOTAN) 2 g in sodium chloride  0.9 % 100 mL IVPB  Status:  Discontinued        2 g 200 mL/hr over 30 Minutes Intravenous On call to O.R. 01/31/24 1144 01/31/24 1145   01/31/24 1400  neomycin  (MYCIFRADIN ) tablet 1,000 mg       Placed in "And" Linked Group   1,000 mg Oral 3 times per day 01/31/24 1144 01/31/24 2036   01/31/24 1400  metroNIDAZOLE  (FLAGYL ) tablet 1,000 mg       Placed in "And" Linked Group   1,000 mg Oral 3 times per day 01/31/24 1144 01/31/24 2036   01/26/24 2200  piperacillin -tazobactam (ZOSYN ) IVPB 3.375 g       Placed in "Followed by" Linked Group   3.375 g 12.5 mL/hr over 240 Minutes Intravenous Every 8 hours 01/26/24 1518     01/26/24 1530  piperacillin -tazobactam (ZOSYN ) IVPB 3.375 g  Placed in "Followed by" Linked Group   3.375 g 100 mL/hr over 30 Minutes Intravenous  Once 01/26/24 1518 01/26/24 1621        Assessment/Plan Sigmoid diverticulitis with perforation and abscess  - CTA abdomen pelvis shows circumferential wall thickening of the mid sigmoid colon with contained perforation (6.1 x 4.0 x 6.0 cm).  - IR note 5/13 reports there is no window for percutaneous drain placement.  - Repeat CT 5/16 - persistent perforated presumed diverticulitis with 2 abscesses- one of which slightly increased, superior collection stable with gas.  IR unable to drain. - At this time, patient has failed conservative management given ongoing pain and fluid collections not amenable to intervention. - Plan for Hartmann's procedure today with Dr. Davonna Estes pending OR availability. Patient asked me to review the procedure and risks. The planned procedure and material risks were discussed with the patient. Risks include but are  not limited to anesthesia (MI, CVA, prolonged intubation, aspiration, death), pain, bleeding, infection, scarring, hernia, damage to surrounding structures (blood vessels/nerves/viscus/organs/ureter), ileus, post operative abscess and DVT/PE. We discussed surgery would result in creation of an ostomy and we will have the WOCN mark him this am. We also discussed typical post-operative care including the possible need for rehab/snf if necessary. The patient's questions were answered to their satisfaction, they voiced understanding and elected to proceed with surgery.   FEN - NPO. IVF per TRH VTE - SCDs, heparin  gtt held at 0749 today ID - Zosyn    LV thrombus? PAD HFrEF - has been seen by cardiology while here CAD HLD HTN Remote H/O polio   I reviewed nursing notes, Consultant (Cards, IR) notes, hospitalist notes, last 24 h vitals and pain scores, last 48 h intake and output, last 24 h labs and trends, and last 24 h imaging results.    LOS: 6 days    Delton Filbert, [facility] Surgery 02/01/2024, 9:33 AM Please see Amion for pager number during day hours 7:00am-4:30pm

## 2024-02-01 NOTE — Progress Notes (Signed)
 PHARMACY - ANTICOAGULATION CONSULT NOTE  Pharmacy Consult for heparin  Indication: High risk for LV thrombus  Allergies  Allergen Reactions   Carvedilol  Diarrhea   Lipitor [Atorvastatin] Other (See Comments)    "Makes pt feel funky"   Plavix  [Clopidogrel ] Rash    Patient Measurements: Height: 5' 6.5" (168.9 cm) Weight: 67.1 kg (148 lb) IBW/kg (Calculated) : 64.95 HEPARIN  DW (KG): 67.1  Vital Signs: Temp: 98.9 F (37.2 C) (05/19 1652) Temp Source: Oral (05/19 1652) BP: 135/66 (05/19 1652) Pulse Rate: 89 (05/19 1652)  Labs: Recent Labs    01/30/24 0506 01/30/24 1918 01/31/24 1025 02/01/24 0727  HGB 12.0* 11.2* 11.5* 11.5*  HCT 34.9* 32.6* 34.6* 34.8*  PLT 198 255 290 261  HEPARINUNFRC 0.29*  --  0.36 0.23*  CREATININE 0.63  --  0.79 0.77    Estimated Creatinine Clearance: 70 mL/min (by C-G formula based on SCr of 0.77 mg/dL).   Medical History: Past Medical History:  Diagnosis Date   Coronary artery disease    Erectile dysfunction    HFrEF (heart failure with reduced ejection fraction) (HCC)    Echocardiogram 07/14/21:  EF 25, global HK, inf HK worse, Gr 2 DD, normal RVSF, mild to mod MR, AV sclerosis w/o AS Echocardiogram 2/23: EF 25-30, global HK, GLS -12.9, normal RVSF, mild to mod MR, AV sclerosis w/o AS    History of hiatal hernia    a lone time ago   Hyperlipidemia    [place] fracture [facility]) 03/22/2020   Myocardial infarction (HCC)    x 2   Polio       Assessment: 78 yo male with sigmoid diverticulitis with perforation and abscess. Echo results today show high risk LV thrombus and pharmacy consulted to dose heparin . No anticoagulants noted PTA.   Heparin  level came back subtherapeutic at 0.23 - was on heparin  at 1550 units/hr prior to being stopped for surgery (had been therapeutic the day prior). S/p exploratory laparotomy and sigmoidectomy 5/19. Hgb 11.5, plt 261.   Per surgery okay for heparin  in 8 hours (2329) on 5/19, no bolus.   Goal of  Therapy:  Heparin  level 0.3-0.7 units/ml Monitor platelets by anticoagulation protocol: Yes   Plan:  Restart heparin  infusion at 1550 units/hr on 5/19@2330  Order heparin  level 6 hr after restart Daily heparin  level, CBC F/u surgical plans as applicable  Thank you for allowing pharmacy to participate in this patient's care,  Nieves Bars, PharmD, BCCCP Clinical Pharmacist  Phone: [phone] 02/01/2024 5:52 PM  Please check AMION for all [facility] phone numbers After 10:00 PM, call Main Pharmacy [phone]

## 2024-02-02 ENCOUNTER — Other Ambulatory Visit: Payer: Self-pay | Admitting: *Deleted

## 2024-02-02 ENCOUNTER — Inpatient Hospital Stay (HOSPITAL_COMMUNITY)

## 2024-02-02 ENCOUNTER — Encounter (HOSPITAL_COMMUNITY): Payer: Self-pay | Admitting: General Surgery

## 2024-02-02 DIAGNOSIS — K5792 Diverticulitis of intestine, part unspecified, without perforation or abscess without bleeding: Secondary | ICD-10-CM | POA: Diagnosis not present

## 2024-02-02 DIAGNOSIS — K6389 Other specified diseases of intestine: Secondary | ICD-10-CM

## 2024-02-02 LAB — CBC
HCT: 34.2 % — ABNORMAL LOW (ref 39.0–52.0)
Hemoglobin: 11.4 g/dL — ABNORMAL LOW (ref 13.0–17.0)
MCH: 28.4 pg (ref 26.0–34.0)
MCHC: 33.3 g/dL (ref 30.0–36.0)
MCV: 85.3 fL (ref 80.0–100.0)
Platelets: 307 10*3/uL (ref 150–400)
RBC: 4.01 MIL/uL — ABNORMAL LOW (ref 4.22–5.81)
RDW: 13.3 % (ref 11.5–15.5)
WBC: 11.4 10*3/uL — ABNORMAL HIGH (ref 4.0–10.5)
nRBC: 0 % (ref 0.0–0.2)

## 2024-02-02 LAB — BASIC METABOLIC PANEL WITH GFR
Anion gap: 10 (ref 5–15)
BUN: 17 mg/dL (ref 8–23)
CO2: 22 mmol/L (ref 22–32)
Calcium: 8.2 mg/dL — ABNORMAL LOW (ref 8.9–10.3)
Chloride: 101 mmol/L (ref 98–111)
Creatinine, Ser: 0.77 mg/dL (ref 0.61–1.24)
GFR, Estimated: >60 mL/min (ref >60)
Glucose, Bld: 127 mg/dL — ABNORMAL HIGH (ref 70–99)
Potassium: 4.3 mmol/L (ref 3.5–5.1)
Sodium: 133 mmol/L — ABNORMAL LOW (ref 135–145)

## 2024-02-02 LAB — HEPARIN LEVEL (UNFRACTIONATED): Heparin Unfractionated: 0.33 [IU]/mL (ref 0.30–0.70)

## 2024-02-02 MED ORDER — CHLORHEXIDINE GLUCONATE CLOTH 2 % EX PADS
6.0000 | MEDICATED_PAD | Freq: Every day | CUTANEOUS | Status: AC
  Administered 2024-02-02 – 2024-02-05 (×5): 6 via TOPICAL

## 2024-02-02 MED ORDER — MUPIROCIN 2 % EX OINT
1.0000 | TOPICAL_OINTMENT | Freq: Two times a day (BID) | CUTANEOUS | Status: AC
  Administered 2024-02-02 – 2024-02-06 (×10): 1 via NASAL
  Filled 2024-02-02: qty 22

## 2024-02-02 NOTE — Anesthesia Postprocedure Evaluation (Signed)
 Anesthesia Post Note  Patient: [name] [name].  Procedure(s) Performed: LAPAROTOMY, EXPLORATORY CREATION, COLOSTOMY (Left: Abdomen) COLECTOMY, PARTIAL (Abdomen)     Patient location during evaluation: PACU Anesthesia Type: General Level of consciousness: awake and alert Pain management: pain level controlled Vital Signs Assessment: post-procedure vital signs reviewed and stable Respiratory status: spontaneous breathing, nonlabored ventilation and respiratory function stable Cardiovascular status: blood pressure returned to baseline and stable Postop Assessment: no apparent nausea or vomiting Anesthetic complications: no   No notable events documented.  Last Vitals:  Vitals:   02/02/24 1127 02/02/24 1629  BP: (!) 92/56 120/81  Pulse: 74 85  Resp:  18  Temp:  36.4 C  SpO2: 93% 92%    Last Pain:  Vitals:   02/02/24 1738  TempSrc:   PainSc: 8    Pain Goal: Patients Stated Pain Goal: 3 (02/01/24 1134)                 Earvin Goldberg

## 2024-02-02 NOTE — Progress Notes (Signed)
 Per secure chat with Michael Maczis,  PA-C it is ok to leave the clean, dry and intact honeycomb dressing on this patient's abdomen in place.

## 2024-02-02 NOTE — Plan of Care (Signed)
   Problem: Clinical Measurements: Goal: Diagnostic test results will improve Outcome: Progressing

## 2024-02-02 NOTE — Plan of Care (Signed)

## 2024-02-02 NOTE — Progress Notes (Signed)
 1 Day Post-Op  Subjective: Reports that his pain is well controlled.  He has been out of bed and is voiding without issue.   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  ROS: See above, otherwise other systems negative  Objective: Vital signs in last 24 hours: Temp:  [97.6 F (36.4 C)-98.9 F (37.2 C)] 97.6 F (36.4 C) (05/20 0744) Pulse Rate:  [68-92] 71 (05/20 0744) Resp:  [11-18] 18 (05/20 0744) BP: (113-164)/(59-86) 113/64 (05/20 0744) SpO2:  [90 %-100 %] 97 % (05/20 0744) Arterial Line BP: (109-114)/(55-77) 114/55 (05/19 1530) Weight:  [67.1 kg] 67.1 kg (05/19 1143) Last BM Date : 02/01/24  Intake/Output from previous day: 05/19 0701 - 05/20 0700 In: 1231.6 [I.V.:1081.6; IV Piggyback:150] Out: 1067 [Urine:915; Drains:142; Blood:10] Intake/Output this shift: No intake/output data recorded.  PE: Gen: male, NAD Abd: soft, non-distended, midline dressing clean and intact, ostomy pink and healthy without gas or stool in the bag, JP with serous output  Lab Results:  Recent Labs    02/01/24 0727 02/02/24 0744  WBC 9.9 11.4*  HGB 11.5* 11.4*  HCT 34.8* 34.2*  PLT 261 307   BMET Recent Labs    02/01/24 0727 02/02/24 0744  NA 131* 133*  K 4.1 4.3  CL 101 101  CO2 21* 22  GLUCOSE 88 127*  BUN 12 17  CREATININE 0.77 0.77  CALCIUM  8.5* 8.2*   PT/INR No results for input(s): "LABPROT", "INR" in the last 72 hours. CMP     Component Value Date/Time   NA 133 (L) 02/02/2024 0744   NA 136 01/08/2024 1025   K 4.3 02/02/2024 0744   CL 101 02/02/2024 0744   CO2 22 02/02/2024 0744   GLUCOSE 127 (H) 02/02/2024 0744   BUN 17 02/02/2024 0744   BUN 11 01/08/2024 1025   CREATININE 0.77 02/02/2024 0744   CREATININE 0.93 03/29/2013 0923   CALCIUM  8.2 (L) 02/02/2024 0744   PROT 5.5 (L) 01/28/2024 0735   PROT 6.2 01/08/2024 1025   ALBUMIN 2.5 (L) 01/28/2024 0735   ALBUMIN 3.9 01/08/2024 1025   AST 17  01/28/2024 0735   ALT 15 01/28/2024 0735   ALKPHOS 56 01/28/2024 0735   BILITOT 1.2 01/28/2024 0735   BILITOT 0.8 01/08/2024 1025   GFRNONAA >60 02/02/2024 0744   GFRNONAA 85 03/29/2013 0923   GFRAA 104 05/01/2020 1031   GFRAA >89 03/29/2013 0923   Lipase     Component Value Date/Time   LIPASE 28 01/26/2024 1247    Studies/Results: No results found.  Anti-infectives: Anti-infectives (From admission, onward)    Start     Dose/Rate Route Frequency Ordered Stop   02/01/24 2200  piperacillin -tazobactam (ZOSYN ) IVPB 3.375 g       Placed in "Followed by" Linked Group   3.375 g 12.5 mL/hr over 240 Minutes Intravenous Every 8 hours 02/01/24 1651 02/06/24 2159   02/01/24 0600  cefoTEtan (CEFOTAN) 2 g in sodium chloride  0.9 % 100 mL IVPB  Status:  Discontinued        2 g 200 mL/hr over 30 Minutes Intravenous On call to O.R. 01/31/24 1144 01/31/24 1145   01/31/24 1400  neomycin  (MYCIFRADIN ) tablet 1,000 mg       Placed in "And" Linked Group   1,000 mg Oral 3 times per day 01/31/24 1144 01/31/24 2036   01/31/24 1400  metroNIDAZOLE  (FLAGYL ) tablet 1,000 mg       Placed in "And"  Linked Group   1,000 mg Oral 3 times per day 01/31/24 1144 01/31/24 2036   01/26/24 2200  piperacillin -tazobactam (ZOSYN ) IVPB 3.375 g  Status:  Discontinued       Placed in "Followed by" Linked Group   3.375 g 12.5 mL/hr over 240 Minutes Intravenous Every 8 hours 01/26/24 1518 02/01/24 1651   01/26/24 1530  piperacillin -tazobactam (ZOSYN ) IVPB 3.375 g       Placed in "Followed by" Linked Group   3.375 g 100 mL/hr over 30 Minutes Intravenous  Once 01/26/24 1518 01/26/24 1621       Assessment/Plan POD 1 from open sigmoidectomy with intra-operative findings of malignancy   - Will follow up pathology. CT chest ordered for staging.  - CEA ordered - CLD - OOB to chair and ambulation today - Continue antibiotics for 5 days post op   LOS: 7 days   Trula Gable [facility] 02/02/2024,  9:00 AM Please see Amion for pager number during day hours 7:00am-4:30pm or 7:00am -11:30am on weekends

## 2024-02-02 NOTE — Progress Notes (Signed)
 PHARMACY - ANTICOAGULATION CONSULT NOTE  Pharmacy Consult for heparin  Indication: High risk for LV thrombus  Allergies  Allergen Reactions   Carvedilol  Diarrhea   Lipitor [Atorvastatin] Other (See Comments)    "Makes pt feel funky"   Plavix  [Clopidogrel ] Rash    Patient Measurements: Height: 5' 6.5" (168.9 cm) Weight: 67.1 kg (148 lb) IBW/kg (Calculated) : 64.95 HEPARIN  DW (KG): 67.1  Vital Signs: Temp: 97.4 F (36.3 C) (05/20 1125) Temp Source: Oral (05/20 0744) BP: 92/56 (05/20 1127) Pulse Rate: 74 (05/20 1127)  Labs: Recent Labs    01/31/24 1025 02/01/24 0727 02/02/24 0744  HGB 11.5* 11.5* 11.4*  HCT 34.6* 34.8* 34.2*  PLT 290 261 307  HEPARINUNFRC 0.36 0.23* 0.33  CREATININE 0.79 0.77 0.77    Estimated Creatinine Clearance: 70 mL/min (by C-G formula based on SCr of 0.77 mg/dL).   Medical History: Past Medical History:  Diagnosis Date   Coronary artery disease    Erectile dysfunction    HFrEF (heart failure with reduced ejection fraction) (HCC)    Echocardiogram 07/14/21:  EF 25, global HK, inf HK worse, Gr 2 DD, normal RVSF, mild to mod MR, AV sclerosis w/o AS Echocardiogram 2/23: EF 25-30, global HK, GLS -12.9, normal RVSF, mild to mod MR, AV sclerosis w/o AS    History of hiatal hernia    a lone time ago   Hyperlipidemia    [place] fracture [facility]) 03/22/2020   Myocardial infarction (HCC)    x 2   Polio       Assessment: 78 yo male with sigmoid diverticulitis with perforation and abscess. Echo results today show high risk LV thrombus and pharmacy consulted to dose heparin . No anticoagulants noted PTA.    S/p exploratory laparotomy and sigmoidectomy 5/19.  Heparin  level came back therapeutic at 0.33 - was on heparin  at 1550 units/hr  Hgb 11.4, plt 307.   Goal of Therapy:  Heparin  level 0.3-0.7 units/ml Monitor platelets by anticoagulation protocol: Yes   Plan:  Continue heparin  infusion at 1550 units/hr Daily heparin  level, CBC F/u future  surgical plans vs switch to Eliquis as applicable  Thank you for allowing pharmacy to participate in this patient's care,  Lovina Ruddle, PharmD, [facility] Clinical Pharmacist Please see AMION for all Pharmacists' Contact Phone Numbers 02/02/2024, 12:51 PM

## 2024-02-02 NOTE — Progress Notes (Addendum)
 PROGRESS NOTE    [name] [name].  ZHY:[id number] DOB: 08-May-1946 [name]: 01/26/2024 PCP: Galvin Jules, FNP  Brief Narrative:  78 y.o. male with medical history significant of hypertension, hyperlipidemia, CAD status post stent, chronic systolic CHF presented with worsening abdominal pain.  He was found to have diverticulitis with diverticular abscess and a contained colonic wall perforation.  He was treated for conservatively for about 6 days without much improvement.  Repeat CT scan did not show improvement either.  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    CTA of the chest abdomen pelvis prior to surgery showed no evidence of aneurysm, dissection, pulmonary embolism.  There was noted to be 50% stenosis of the SMA, high-grade stenosis at the left common iliac and left common femoral artery of 70-90% as well as a left complete occlusion of the left external iliac artery with distal reconstitution.  Further iliac atherosclerosis noted on the right with stenosis in the 50-70% range.  In addition colonic wall thickening with evidence of diverticulitis with diverticular abscess and contained colonic wall perforation also noted.  In addition there was bronchial thickening, retaining stool, small hernias and pulmonary nodules.    Assessment & Plan:   Principal Problem:   Acute diverticulitis Active Problems:   CAD (coronary artery disease)   Mixed hyperlipidemia   HFrEF (heart failure with reduced ejection fraction) (HCC)   Status post coronary artery stent placement   Primary hypertension   Perforation and abscess of large intestine concurrent with and due to diverticulitis   Diverticulitis with contained perforation and abscess-patient admitted with abdominal pain for 5 days prior to admission to hospital.   CT showed diverticulitis with diverticular abscess and contained  perforation with leukocytosis.   General surgery and IR consulted.  IR could not find a window to place a drain.  White count remains elevated while he was being treated with Zosyn  he continued to have complaints of pain in the suprapubic area as well as both lower back areas.  He was taken to the OR on the 19th and had resection of sigmoid colon.  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 Dr Scherrie Curt will follow patient after dc.   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  On aspirin  and statin for his CAD. Will need to follow-up with vascular surgery outpatient   Hypertension Continue home losartan    Hyperlipidemia Continue home rosuvastatin    CAD Status post stents Continue home ASA, rosuvastatin    Chronic systolic CHF-his volume status is stable not on diuretics prior to admission.  Since he has been n.p.o. most of the time and was given IV fluids diuresis was not started.  Cardiology recommended Eliquis 5 mg twice daily on discharge with Toprol  12.5 mg daily and losartan  50 mg daily. Echo -left ventricular ejection fraction 25 to 30% severely decreased function and demonstrate global hypokinesis and mild LVH.Sluggish flow at the apex cannot rule out early thrombus.  Right ventricular systolic function mild reduced patient was seen by cardiology and was started on heparin  drip concern for left ventricular thrombus and this signed off on 16 May.  Patient will need Eliquis on discharge.  Estimated body mass index is 23.53 kg/m as calculated from the following:   Height as of this encounter: 5' 6.5" (1.689 m).  Weight as of this encounter: 67.1 kg.  DVT prophylaxis: scd Code Status: full Family Communication:none Disposition Plan:  Status is: Inpatient Remains inpatient appropriate because: acute illness   Consultants:  surgery  Procedures:none Antimicrobials: zosyn   Subjective:  Had sigmoid colectomy  yesterday pain improved he was able to stand up by the side of the bed today  Objective: Vitals:   02/02/24 0514 02/02/24 0744 02/02/24 1125 02/02/24 1127  BP: (!) 114/59 113/64 (!) 81/51 (!) 92/56  Pulse: 79 71 78 74  Resp: 16 18 16    Temp: 97.7 F (36.5 C) 97.6 F (36.4 C) (!) 97.4 F (36.3 C)   TempSrc:  Oral    SpO2: 96% 97% 91% 93%  Weight:      Height:        Intake/Output Summary (Last 24 hours) at 02/02/2024 1424 Last data filed at 02/02/2024 1610 Gross per 24 hour  Intake 1181.58 ml  Output 942 ml  Net 239.58 ml    Filed Weights   01/26/24 1245 02/01/24 1143  Weight: 67.1 kg 67.1 kg    Examination:  General exam: Appears nad Respiratory system: Few scattered rhonchi  cardiovascular system: Regular rate and rhythm Gastrointestinal system: Abdomen is nondistended, soft colostomy in place with some output noted Central nervous system: Alert and oriented. No focal neurological deficits. Extremities: no edema   Data Reviewed: I have personally reviewed following labs and imaging studies  CBC: Recent Labs  Lab 01/30/24 0506 01/30/24 1918 01/31/24 1025 02/01/24 0727 02/02/24 0744  WBC 11.7* 11.8* 9.9 9.9 11.4*  HGB 12.0* 11.2* 11.5* 11.5* 11.4*  HCT 34.9* 32.6* 34.6* 34.8* 34.2*  MCV 84.3 83.8 85.4 87.2 85.3  PLT 198 255 290 261 307   Basic Metabolic Panel: Recent Labs  Lab 01/28/24 0735 01/30/24 0506 01/31/24 1025 02/01/24 0727 02/02/24 0744  NA 133* 129* 133* 131* 133*  K 4.1 4.0 4.5 4.1 4.3  CL 101 98 99 101 101  CO2 22 19* 26 21* 22  GLUCOSE 91 86 101* 88 127*  BUN 16 15 7* 12 17  CREATININE 0.75 0.63 0.79 0.77 0.77  CALCIUM  8.2* 8.3* 8.4* 8.5* 8.2*  MG  --   --  2.1  --   --    GFR: Estimated Creatinine Clearance: 70 mL/min (by C-G formula based on SCr of 0.77 mg/dL). Liver Function Tests: Recent Labs  Lab 01/27/24 0641 01/28/24 0735  AST 19 17  ALT 15 15  ALKPHOS 56 56  BILITOT 1.6* 1.2  PROT 6.9 5.5*  ALBUMIN 2.8* 2.5*    No results for input(s): "LIPASE", "AMYLASE" in the last 168 hours.  No results for input(s): "AMMONIA" in the last 168 hours. Coagulation Profile: No results for input(s): "INR", "PROTIME" in the last 168 hours. Cardiac Enzymes: No results for input(s): "CKTOTAL", "CKMB", "CKMBINDEX", "TROPONINI" in the last 168 hours. BNP (last 3 results) No results for input(s): "PROBNP" in the last 8760 hours. HbA1C: No results for input(s): "HGBA1C" in the last 72 hours. CBG: No results for input(s): "GLUCAP" in the last 168 hours. Lipid Profile: No results for input(s): "CHOL", "HDL", "LDLCALC", "TRIG", "CHOLHDL", "LDLDIRECT" in the last 72 hours. Thyroid  Function Tests: No results for input(s): "TSH", "T4TOTAL", "FREET4", "T3FREE", "THYROIDAB" in the last 72 hours. Anemia Panel: No results for input(s): "VITAMINB12", "FOLATE", "FERRITIN", "TIBC", "IRON", "RETICCTPCT" in the last 72 hours. Sepsis Labs: Recent Labs  Lab 01/26/24 1547  LATICACIDVEN 1.1    Recent Results (from the past 240 hours)  Culture,  blood (routine x 2)     Status: None   Collection Time: 01/26/24  3:15 PM   Specimen: BLOOD RIGHT FOREARM  Result Value Ref Range Status   Specimen Description BLOOD RIGHT FOREARM  Final   Special Requests   Final    BOTTLES DRAWN AEROBIC AND ANAEROBIC Blood Culture adequate volume   Culture   Final    NO GROWTH 5 DAYS Performed at [facility], [name], [place], [place] [address]    Report Status 01/31/2024 FINAL  Final  Culture, blood (routine x 2)     Status: None   Collection Time: 01/26/24  3:35 PM   Specimen: BLOOD RIGHT WRIST  Result Value Ref Range Status   Specimen Description BLOOD RIGHT WRIST  Final   Special Requests   Final    BOTTLES DRAWN AEROBIC AND ANAEROBIC Blood Culture adequate volume   Culture   Final    NO GROWTH 5 DAYS Performed at [facility], [address], [place], [place] [address]    Report Status 01/31/2024 FINAL  Final   Surgical pcr screen     Status: Abnormal   Collection Time: 02/01/24 11:48 AM   Specimen: Nasal Mucosa; Nasal Swab  Result Value Ref Range Status   MRSA, PCR NEGATIVE NEGATIVE Final   Staphylococcus aureus POSITIVE (A) NEGATIVE Final    Comment: (NOTE) The Xpert SA Assay (FDA approved for NASAL specimens in patients 64 years of age and older), is one component of a comprehensive surveillance program. It is not intended to diagnose infection nor to guide or monitor treatment. Performed at [facility], [address] [place], [place] [address]          Radiology Studies: No results found.   Scheduled Meds:  acetaminophen   1,000 mg Oral Q6H   Chlorhexidine  Gluconate Cloth  6 each Topical Daily   feeding supplement  1 Container Oral TID BM   losartan   50 mg Oral Daily   metoprolol  succinate  12.5 mg Oral Daily   mupirocin  ointment  1 Application Nasal BID   polyethylene glycol  17 g Oral Daily   rosuvastatin   20 mg Oral Daily   sodium chloride  flush  3 mL Intravenous Q12H   Continuous Infusions:  heparin  1,550 Units/hr (02/02/24 0004)   piperacillin -tazobactam (ZOSYN )  IV 3.375 g (02/02/24 1358)     LOS: 7 days    Time spent: 39 min [name] Lew, MD  02/02/2024, 2:24 PM

## 2024-02-02 NOTE — Progress Notes (Signed)
 Oncology Discharge Planning Note  [facility] Address: [address], [place], [place] [address] Hours of Operation:  [place], Monday - Friday  Clinic Contact Information:  [phone]) [phone]  Oncology Care Team: Medical Oncologist:  Scherrie Curt  Patient Details: Name:  [name] [name], [name] [name] MRN:   [id number] DOB:   04-Jun-1946 Reason for Current Admission: @PPROB @  Discharge Planning Narrative: Notification of admission received by inpatient team for [name] [name].  Discharge follow-up appointments for oncology are current and available on the AVS and MyChart.   Upon discharge from the hospital, hematology/oncology's post discharge plan of care for the outpatient setting is:   February 22, 2024 at 1:40pm  [facility] at [facility] [address] [place], [place] [address]  [phone]   [name] [name] will be called within two business days after discharge to review hematology/oncology's plan of care for full understanding.    Outpatient Oncology Specific Care Only: Oncology appointment transportation needs addressed?:  no Oncology medication management for symptom management addressed?:  not applicable Chemo Alert Card reviewed?:  not applicable Immunotherapy Alert Card reviewed?:  not applicable

## 2024-02-02 NOTE — Evaluation (Signed)
 Physical Therapy Evaluation Patient Details Name: [name] [name]. MRN: [id number] DOB: 12-24-1945 Today's Date: 02/02/2024  History of Present Illness  Pt is a 78 y.o. male presenting to [facility] ED on 01/26/24 with abdominal pain for 5 days; CT shows diverticulitis with diverticular abscess and contained perforation with leukocytosis. PMH is significant for HTN, HLD, CAD, and CHF.  Clinical Impression  Pt presents to evaluation with decreased activity tolerance, decreased strength, and decreased mobility, all impacting patient's ability to mobilize near baseline. Pt was able to ambulate w/ no AD and minimal physical assistance for one loss of balance. Pt would benefit from further gait, and stair training. PT will continue to treat patient while he is admitted. Discussed possibility of follow-up therapies and patient declined; reports will progress mobility accordingly. No follow up therapies recommended.         If plan is discharge home, recommend the following: Help with stairs or ramp for entrance;Assist for transportation;A little help with walking and/or transfers;A little help with bathing/dressing/bathroom   Can travel by private vehicle        Equipment Recommendations None recommended by PT  Recommendations for Other Services       Functional Status Assessment Patient has had a recent decline in their functional status and demonstrates the ability to make significant improvements in function in a reasonable and predictable amount of time.     Precautions / Restrictions Precautions Precautions: None Restrictions Weight Bearing Restrictions Per Provider Order: No      Mobility  Bed Mobility Overal bed mobility: Modified Independent             General bed mobility comments: HOB elevated    Transfers Overall transfer level: Needs assistance Equipment used: None Transfers: Sit to/from Stand Sit to Stand: Supervision           General transfer comment: pt  completed STS from EOB w/ supervision and no AD, pushing up from bed with bilateral upper extremities    Ambulation/Gait Ambulation/Gait assistance: Supervision Gait Distance (Feet): 150 Feet Assistive device: None Gait Pattern/deviations: Step-through pattern, Decreased step length - right, Decreased stance time - right, Narrow base of support Gait velocity: decreased Gait velocity interpretation: <1.8 ft/sec, indicate of risk for recurrent falls   General Gait Details: pt ambulates 150' w/ supervision and one instance of min A for L lateral loss of balance. Pt ambualtes with decreased step length on the right, decreased cadence, and increased trunk/R hip flexion during swing phase of gait to advance RLE (baseline).  Stairs            Wheelchair Mobility     Tilt Bed    Modified Rankin (Stroke Patients Only)       Balance Overall balance assessment: Needs assistance Sitting-balance support: No upper extremity supported, Feet supported Sitting balance-Leahy Scale: Good     Standing balance support: No upper extremity supported, During functional activity Standing balance-Leahy Scale: Fair                               Pertinent Vitals/Pain Pain Assessment Pain Assessment: 0-10 Pain Score: 2  Pain Location: abdomen Pain Descriptors / Indicators: Discomfort Pain Intervention(s): Monitored during session, Limited activity within patient's tolerance    Home Living Family/patient expects to be discharged to:: Private residence Living Arrangements: Alone Available Help at Discharge: Neighbor;Available PRN/intermittently Type of Home: House Home Access: Stairs to enter Entrance Stairs-Rails: Right;Left;Can reach both Entrance  Stairs-Number of Steps: 3 (one 1/2 step and 2 full steps)   Home Layout: One level Home Equipment: Cane - single point      Prior Function Prior Level of Function : Independent/Modified Independent             Mobility  Comments: pt reports independent with all mobility not using any AD ADLs Comments: pt reports independent with ADLs     Extremity/Trunk Assessment   Upper Extremity Assessment Upper Extremity Assessment: Overall WFL for tasks assessed    Lower Extremity Assessment Lower Extremity Assessment: Generalized weakness    Cervical / Trunk Assessment Cervical / Trunk Assessment: Kyphotic  Communication   Communication Communication: No apparent difficulties    Cognition Arousal: Alert Behavior During Therapy: WFL for tasks assessed/performed   PT - Cognitive impairments: No apparent impairments                         Following commands: Intact       Cueing Cueing Techniques: Verbal cues     General Comments General comments (skin integrity, edema, etc.): no signs of acute distress    Exercises     Assessment/Plan    PT Assessment Patient needs continued PT services  PT Problem List Decreased strength;Decreased activity tolerance;Decreased balance;Decreased mobility       PT Treatment Interventions Gait training;Stair training;Functional mobility training;Therapeutic activities;Therapeutic exercise;Balance training    PT Goals (Current goals can be found in the Care Plan section)  Acute Rehab PT Goals Patient Stated Goal: to be able to get back to working PT Goal Formulation: With patient Time For Goal Achievement: 02/16/24 Potential to Achieve Goals: Good    Frequency Min 2X/week     Co-evaluation               AM-PAC PT "6 Clicks" Mobility  Outcome Measure Help needed turning from your back to your side while in a flat bed without using bedrails?: None Help needed moving from lying on your back to sitting on the side of a flat bed without using bedrails?: None Help needed moving to and from a bed to a chair (including a wheelchair)?: A Little Help needed standing up from a chair using your arms (e.g., wheelchair or bedside chair)?: A  Little Help needed to walk in hospital room?: A Little Help needed climbing 3-5 steps with a railing? : A Little 6 Click Score: 20    End of Session Equipment Utilized During Treatment: Gait belt Activity Tolerance: Patient tolerated treatment well Patient left: in chair;with call bell/phone within reach;with chair alarm set;with nursing/sitter in room Nurse Communication: Mobility status PT Visit Diagnosis: Unsteadiness on feet (R26.81);Muscle weakness (generalized) (M62.81)    Time: 8119-1478 PT Time Calculation (min) (ACUTE ONLY): 27 min   Charges:   PT Evaluation $PT Eval Low Complexity: 1 Low   PT General Charges $$ ACUTE PT VISIT: 1 Visit         Lonell Rives, SPT Acute Rehab [phone]   Lonell Rives 02/02/2024, 2:41 PM

## 2024-02-03 DIAGNOSIS — C189 Malignant neoplasm of colon, unspecified: Secondary | ICD-10-CM

## 2024-02-03 DIAGNOSIS — K5792 Diverticulitis of intestine, part unspecified, without perforation or abscess without bleeding: Secondary | ICD-10-CM | POA: Diagnosis not present

## 2024-02-03 DIAGNOSIS — I5020 Unspecified systolic (congestive) heart failure: Secondary | ICD-10-CM | POA: Diagnosis not present

## 2024-02-03 LAB — COMPREHENSIVE METABOLIC PANEL WITH GFR
ALT: 16 U/L (ref 0–44)
AST: 23 U/L (ref 15–41)
Albumin: 2.3 g/dL — ABNORMAL LOW (ref 3.5–5.0)
Alkaline Phosphatase: 47 U/L (ref 38–126)
Anion gap: 10 (ref 5–15)
BUN: 17 mg/dL (ref 8–23)
CO2: 24 mmol/L (ref 22–32)
Calcium: 8.2 mg/dL — ABNORMAL LOW (ref 8.9–10.3)
Chloride: 99 mmol/L (ref 98–111)
Creatinine, Ser: 0.79 mg/dL (ref 0.61–1.24)
GFR, Estimated: >60 mL/min (ref >60)
Glucose, Bld: 107 mg/dL — ABNORMAL HIGH (ref 70–99)
Potassium: 4.2 mmol/L (ref 3.5–5.1)
Sodium: 133 mmol/L — ABNORMAL LOW (ref 135–145)
Total Bilirubin: 0.5 mg/dL (ref 0.0–1.2)
Total Protein: 5.5 g/dL — ABNORMAL LOW (ref 6.5–8.1)

## 2024-02-03 LAB — CEA: CEA: 3.3 ng/mL (ref 0.0–4.7)

## 2024-02-03 LAB — CBC
HCT: 32.9 % — ABNORMAL LOW (ref 39.0–52.0)
Hemoglobin: 11.2 g/dL — ABNORMAL LOW (ref 13.0–17.0)
MCH: 29 pg (ref 26.0–34.0)
MCHC: 34 g/dL (ref 30.0–36.0)
MCV: 85.2 fL (ref 80.0–100.0)
Platelets: 332 10*3/uL (ref 150–400)
RBC: 3.86 MIL/uL — ABNORMAL LOW (ref 4.22–5.81)
RDW: 13.4 % (ref 11.5–15.5)
WBC: 10.9 10*3/uL — ABNORMAL HIGH (ref 4.0–10.5)
nRBC: 0 % (ref 0.0–0.2)

## 2024-02-03 LAB — HEPARIN LEVEL (UNFRACTIONATED): Heparin Unfractionated: 0.5 [IU]/mL (ref 0.30–0.70)

## 2024-02-03 NOTE — Progress Notes (Addendum)
 2 Days Post-Op  Subjective: CC: Pt son on the phone.  Pt reports no abdominal pain. Having some back pain that he thinks is "gas". Pain well controlled. Tolerating cld and shakes without n/v. Wants to eat. No ostomy output except sweat so far. Voiding without issues. Mobilizing.   Afebrile. No tachycardia. Hypotensive yesterday am. SBP improved this am to 100. He is on losartan  and a BB. Hgb stable at 11.2 (11.4). Cr wnl. WBC 10.9 (11.4).   Objective: Vital signs in last 24 hours: Temp:  [97.4 F (36.3 C)-97.9 F (36.6 C)] 97.9 F (36.6 C) (05/21 0740) Pulse Rate:  [70-85] 70 (05/21 0740) Resp:  [16-20] 17 (05/21 0353) BP: (81-120)/(50-83) 100/50 (05/21 0740) SpO2:  [91 %-94 %] 93 % (05/21 0740) Last BM Date : 02/02/24  Intake/Output from previous day: 05/20 0701 - 05/21 0700 In: 569.1 [P.O.:240; I.V.:149.6; IV Piggyback:179.5] Out: 630 [Urine:575; Drains:55] Intake/Output this shift: No intake/output data recorded.  PE: Gen:  Alert, NAD, pleasant Abd: Soft, no distension, appropriately tender around incision, no rigidity or guarding and otherwise NT, +BS. Stoma viable. Sweat in ostomy bag. Midline incision with honeycomb dressing in place - there is some dried drainage on the inferior aspect of the drainage without signs of active drainage - otherwise dressing is cdi. JP drain SS - 55cc/24 hours.  Psych: A&Ox3   Lab Results:  Recent Labs    02/02/24 0744 02/03/24 0700  WBC 11.4* 10.9*  HGB 11.4* 11.2*  HCT 34.2* 32.9*  PLT 307 332   BMET Recent Labs    02/02/24 0744 02/03/24 0700  NA 133* 133*  K 4.3 4.2  CL 101 99  CO2 22 24  GLUCOSE 127* 107*  BUN 17 17  CREATININE 0.77 0.79  CALCIUM  8.2* 8.2*   PT/INR No results for input(s): "LABPROT", "INR" in the last 72 hours. CMP     Component Value Date/Time   NA 133 (L) 02/03/2024 0700   NA 136 01/08/2024 1025   K 4.2 02/03/2024 0700   CL 99 02/03/2024 0700   CO2 24 02/03/2024 0700   GLUCOSE 107 (H)  02/03/2024 0700   BUN 17 02/03/2024 0700   BUN 11 01/08/2024 1025   CREATININE 0.79 02/03/2024 0700   CREATININE 0.93 03/29/2013 0923   CALCIUM  8.2 (L) 02/03/2024 0700   PROT 5.5 (L) 02/03/2024 0700   PROT 6.2 01/08/2024 1025   ALBUMIN 2.3 (L) 02/03/2024 0700   ALBUMIN 3.9 01/08/2024 1025   AST 23 02/03/2024 0700   ALT 16 02/03/2024 0700   ALKPHOS 47 02/03/2024 0700   BILITOT 0.5 02/03/2024 0700   BILITOT 0.8 01/08/2024 1025   GFRNONAA >60 02/03/2024 0700   GFRNONAA 85 03/29/2013 0923   GFRAA 104 05/01/2020 1031   GFRAA >89 03/29/2013 0923   Lipase     Component Value Date/Time   LIPASE 28 01/26/2024 1247    Studies/Results: No results found.  Anti-infectives: Anti-infectives (From admission, onward)    Start     Dose/Rate Route Frequency Ordered Stop   02/01/24 2200  piperacillin -tazobactam (ZOSYN ) IVPB 3.375 g       Placed in "Followed by" Linked Group   3.375 g 12.5 mL/hr over 240 Minutes Intravenous Every 8 hours 02/01/24 1651 02/06/24 2159   02/01/24 0600  cefoTEtan (CEFOTAN) 2 g in sodium chloride  0.9 % 100 mL IVPB  Status:  Discontinued        2 g 200 mL/hr over 30 Minutes Intravenous On call to O.R.  01/31/24 1144 01/31/24 1145   01/31/24 1400  neomycin  (MYCIFRADIN ) tablet 1,000 mg       Placed in "And" Linked Group   1,000 mg Oral 3 times per day 01/31/24 1144 01/31/24 2036   01/31/24 1400  metroNIDAZOLE  (FLAGYL ) tablet 1,000 mg       Placed in "And" Linked Group   1,000 mg Oral 3 times per day 01/31/24 1144 01/31/24 2036   01/26/24 2200  piperacillin -tazobactam (ZOSYN ) IVPB 3.375 g  Status:  Discontinued       Placed in "Followed by" Linked Group   3.375 g 12.5 mL/hr over 240 Minutes Intravenous Every 8 hours 01/26/24 1518 02/01/24 1651   01/26/24 1530  piperacillin -tazobactam (ZOSYN ) IVPB 3.375 g       Placed in "Followed by" Linked Group   3.375 g 100 mL/hr over 30 Minutes Intravenous  Once 01/26/24 1518 01/26/24 1621        Assessment/Plan POD 2  s/p open sigmoidectomy by Dr. Davonna Estes on 02/01/24 with intra-operative findings of malignancy  - Final path pending - CEA 3.3 - CT chest pending - Appears Oncology, Dr. Scherrie Curt, has already been notified - Cont abx, plan 5d post op - Cont JP, SS currently - Adv to FLD. ADAT. Cont shakes.  - Consult to WOCN for new ostomy - Mobilize, PT - no PT f/u recommended - Pulm toilet  FEN - FLD, ADAT to regular diet, shakes, IVF per primary  VTE - SCDs, heparin  gtt ID - Zosyn  Foley - Spont void.   LV thrombus? PAD HFrEF CAD HLD HTN Remote H/O polio    LOS: 8 days    Delton Filbert, [facility] Surgery 02/03/2024, 8:49 AM Please see Amion for pager number during day hours 7:00am-4:30pm

## 2024-02-03 NOTE — Consult Note (Signed)
 WOC Nurse ostomy consult note Stoma type/location: LLQ colostomy Stomal assessment/size: 1 1/2" edematous has a sip from 7 to 10 o'clock- pink and moist  blood tinged liquid only- beginning PO liquids today.  Peristomal assessment: intact  midline staple line. Honeycomb dressing removed, saturated with serosanguinous effluent near distal end.  Treatment options for stomal/peristomal skin: barrier ring, 2 piece convex pouch  Output blood tinged liquid only  Ostomy pouching: 2pc.convex  Education provided: Performed pouch change.  Patient lives alone but states he has family members that are trained medically.  I remind him that leaks and emptying happen around the clock and he will need to be independent in self care. He agrees.  We remove old pouch and honeycomb dressing.  Cleanse around stoma.  Patient molds and applies ring with assistance.  We measure stoma and he observes me cut to fit.  He assembles pouch and barrier together and we apply pouch together.  He is able to demonstrate roll closure and I simulate emptying.  We discuss twice weekly pouch changes.  Showering with or without pouch. Empyting when 1/3 full. I provide educational folder and clinic info.   Enrolled patient in [employer] DC program: Yes will today.  Will follow.  Branda Cain MSN, RN, FNP-BC CWON Wound, Ostomy, Continence Nurse Outpatient [facility] [phone] Pager [phone]

## 2024-02-03 NOTE — Progress Notes (Addendum)
 PROGRESS NOTE    [name] [name].  ZOX:[id number] DOB: [birth year] [name]: 01/26/2024 PCP: Galvin Jules, FNP   Brief Narrative:  78 y.o. male with medical history significant of hypertension, hyperlipidemia, CAD status post stent, chronic systolic CHF presented with worsening abdominal pain.  He was found to have diverticulitis with diverticular abscess and a contained colonic wall perforation.  He was hospitalized for further management.  No improvement noted with conservative management.  Subsequently taken to the OR by general surgery.      Assessment & Plan:  Acute Diverticulitis with contained perforation and abscess CT showed diverticulitis with diverticular abscess and contained perforation with leukocytosis. General surgery and IR consulted.  IR could not find a window to place a drain.   Despite conservative management patient did not have any improvement.  Subsequently taken to the OR on 5/19.  Underwent exploratory laparotomy and sigmoidectomy.  Has a colostomy.  Was found to have sigmoid mass. General surgery continues to follow.  Remains on Zosyn .  Plan to continue antibiotics for 5 days postoperatively. Diet advancement per general surgery.  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  Pathology report is pending. CT of the abdomen pelvis did not show any enlarged lymph nodes.  Moderately enlarged prostate gland was noted. CT of the chest is pending.  Of note patient did undergo CT angiogram chest abdomen pelvis on 5/13 which showed multiple pulmonary nodules. CEA noted to be 3.3. Patient has been made an appointment with medical oncology.  Chronic systolic CHF/LV thrombus Echocardiogram showed LVEF of 25 to 30%. Patient was seen by cardiology.  There was concern for LV thrombus.  Patient was started on IV heparin .  To be transition to Eliquis when cleared by general surgery. Cardiology to schedule outpatient follow-up. Patient noted to be on ARB and  beta-blocker.  Seems to be stable from a volume standpoint. Blood pressure noted to be low this morning.  Will hold ARB and beta-blocker for now.  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  On aspirin  and statin for his CAD. Will need to follow-up with vascular surgery outpatient   Essential hypertension Continue home losartan    Hyperlipidemia Continue home rosuvastatin    CAD Status post stents Continue home ASA, rosuvastatin    DVT prophylaxis: On IV heparin  Code Status: full Family Communication:none Disposition Plan:  Status is: Inpatient Remains inpatient appropriate because: acute illness   Consultants:  surgery  Procedures:none Antimicrobials: zosyn   Subjective: Feels well.  Asking about diet advancement.  Told him to discuss this with surgical staff.  Objective: Vitals:   02/02/24 1629 02/02/24 1930 02/03/24 0353 02/03/24 0740  BP: 120/81 108/83 (!) 106/59 (!) 100/50  Pulse: 85 71 70 70  Resp: 18 20 17    Temp: 97.6 F (36.4 C) 97.6 F (36.4 C) 97.6 F (36.4 C) 97.9 F (36.6 C)  TempSrc: Oral Oral Oral Oral  SpO2: 92% 94% 94% 93%  Weight:      Height:        Intake/Output Summary (Last 24 hours) at 02/03/2024 1018 Last data filed at 02/03/2024 1006 Gross per 24 hour  Intake 809.14 ml  Output 830 ml  Net -20.86 ml    Filed Weights   01/26/24 1245 02/01/24 1143  Weight: 67.1 kg 67.1 kg    Examination:  General appearance: Awake alert.  In no distress Resp: Clear to auscultation bilaterally.  Normal effort Cardio: S1-S2 is  normal regular.  No S3-S4.  No rubs murmurs or bruit GI: Abdomen is soft.  Ostomy is noted.  Drain is noted. Extremities: No edema.  Full range of motion of lower extremities. Neurologic: Alert and oriented x3.  No focal neurological deficits.    Data Reviewed: I have personally reviewed following labs and imaging studies  CBC: Recent Labs  Lab  01/30/24 1918 01/31/24 1025 02/01/24 0727 02/02/24 0744 02/03/24 0700  WBC 11.8* 9.9 9.9 11.4* 10.9*  HGB 11.2* 11.5* 11.5* 11.4* 11.2*  HCT 32.6* 34.6* 34.8* 34.2* 32.9*  MCV 83.8 85.4 87.2 85.3 85.2  PLT 255 290 261 307 332   Basic Metabolic Panel: Recent Labs  Lab 01/30/24 0506 01/31/24 1025 02/01/24 0727 02/02/24 0744 02/03/24 0700  NA 129* 133* 131* 133* 133*  K 4.0 4.5 4.1 4.3 4.2  CL 98 99 101 101 99  CO2 19* 26 21* 22 24  GLUCOSE 86 101* 88 127* 107*  BUN 15 7* 12 17 17   CREATININE 0.63 0.79 0.77 0.77 0.79  CALCIUM  8.3* 8.4* 8.5* 8.2* 8.2*  MG  --  2.1  --   --   --    GFR: Estimated Creatinine Clearance: 70 mL/min (by C-G formula based on SCr of 0.79 mg/dL). Liver Function Tests: Recent Labs  Lab 01/28/24 0735 02/03/24 0700  AST 17 23  ALT 15 16  ALKPHOS 56 47  BILITOT 1.2 0.5  PROT 5.5* 5.5*  ALBUMIN 2.5* 2.3*    Recent Results (from the past 240 hours)  Culture, blood (routine x 2)     Status: None   Collection Time: 01/26/24  3:15 PM   Specimen: BLOOD RIGHT FOREARM  Result Value Ref Range Status   Specimen Description BLOOD RIGHT FOREARM  Final   Special Requests   Final    BOTTLES DRAWN AEROBIC AND ANAEROBIC Blood Culture adequate volume   Culture   Final    NO GROWTH 5 DAYS Performed at [facility], [address] [place], [place] [address]    Report Status 01/31/2024 FINAL  Final  Culture, blood (routine x 2)     Status: None   Collection Time: 01/26/24  3:35 PM   Specimen: BLOOD RIGHT WRIST  Result Value Ref Range Status   Specimen Description BLOOD RIGHT WRIST  Final   Special Requests   Final    BOTTLES DRAWN AEROBIC AND ANAEROBIC Blood Culture adequate volume   Culture   Final    NO GROWTH 5 DAYS Performed at [facility], [address], [place], [place] [address]    Report Status 01/31/2024 FINAL  Final  Surgical pcr screen     Status: Abnormal   Collection Time: 02/01/24 11:48 AM   Specimen: Nasal Mucosa;  Nasal Swab  Result Value Ref Range Status   MRSA, PCR NEGATIVE NEGATIVE Final   Staphylococcus aureus POSITIVE (A) NEGATIVE Final    Comment: (NOTE) The Xpert SA Assay (FDA approved for NASAL specimens in patients 35 years of age and older), is one component of a comprehensive surveillance program. It is not intended to diagnose infection nor to guide or monitor treatment. Performed at [facility], [address] [place], [place] [address]       Radiology Studies: No results found.   Scheduled Meds:  acetaminophen   1,000 mg Oral Q6H   Chlorhexidine  Gluconate Cloth  6 each Topical Daily   feeding supplement  1 Container Oral TID BM   losartan   50 mg Oral Daily   metoprolol  succinate  12.5 mg Oral Daily   mupirocin  ointment  1 Application Nasal BID   polyethylene glycol  17 g Oral Daily   rosuvastatin   20 mg Oral Daily   sodium chloride  flush  3 mL Intravenous Q12H   Continuous Infusions:  heparin  1,550 Units/hr (02/03/24 9147)   piperacillin -tazobactam (ZOSYN )  IV 3.375 g (02/03/24 0412)     LOS: 8 days     Maylene Spear, MD  02/03/2024, 10:18 AM

## 2024-02-03 NOTE — Plan of Care (Signed)

## 2024-02-03 NOTE — Progress Notes (Addendum)
 Honeycomb dressing removed due to serosanguinous saturation.  Cleansed incision with staples and redressed with tegaderm.  Cleansed Jp drain site and redressed.

## 2024-02-03 NOTE — Progress Notes (Signed)
 PHARMACY - ANTICOAGULATION CONSULT NOTE  Pharmacy Consult for heparin  Indication: High risk for LV thrombus  Allergies  Allergen Reactions   Carvedilol  Diarrhea   Lipitor [Atorvastatin] Other (See Comments)    "Makes pt feel funky"   Plavix  [Clopidogrel ] Rash    Patient Measurements: Height: 5' 6.5" (168.9 cm) Weight: 67.1 kg (148 lb) IBW/kg (Calculated) : 64.95 HEPARIN  DW (KG): 67.1  Vital Signs: Temp: 97.9 F (36.6 C) (05/21 0740) Temp Source: Oral (05/21 0740) BP: 100/50 (05/21 0740) Pulse Rate: 70 (05/21 0740)  Labs: Recent Labs    02/01/24 0727 02/02/24 0744 02/03/24 0700  HGB 11.5* 11.4* 11.2*  HCT 34.8* 34.2* 32.9*  PLT 261 307 332  HEPARINUNFRC 0.23* 0.33 0.50  CREATININE 0.77 0.77 0.79    Estimated Creatinine Clearance: 70 mL/min (by C-G formula based on SCr of 0.79 mg/dL).  Assessment: 78 yo male with sigmoid diverticulitis with perforation and abscess s/p open sigmoidectomy with colostomy on 5/19. Echo results show high risk LV thrombus and pharmacy consulted to dose heparin . No anticoagulants noted PTA.   Heparin  level therapeutic at 0.5 on 1550 units/hr. No bleeding noted, Hgb stable 11s, plt normal.   Goal of Therapy:  Heparin  level 0.3-0.7 units/ml Monitor platelets by anticoagulation protocol: Yes   Plan:  Continue heparin  infusion at 1550 units/hr Daily heparin  level, CBC Monitor for s/sx of bleeding F/u future surgical plans vs switch to Eliquis as applicable  Thank you for involving pharmacy in this patient's care.  Caroline Cinnamon, PharmD, BCPS Clinical Pharmacist Clinical phone for 02/03/2024 is [phone] 02/03/2024 9:57 AM

## 2024-02-04 DIAGNOSIS — I5020 Unspecified systolic (congestive) heart failure: Secondary | ICD-10-CM | POA: Diagnosis not present

## 2024-02-04 DIAGNOSIS — K5792 Diverticulitis of intestine, part unspecified, without perforation or abscess without bleeding: Secondary | ICD-10-CM | POA: Diagnosis not present

## 2024-02-04 DIAGNOSIS — I513 Intracardiac thrombosis, not elsewhere classified: Secondary | ICD-10-CM

## 2024-02-04 LAB — CBC
HCT: 32.7 % — ABNORMAL LOW (ref 39.0–52.0)
Hemoglobin: 10.9 g/dL — ABNORMAL LOW (ref 13.0–17.0)
MCH: 28.8 pg (ref 26.0–34.0)
MCHC: 33.3 g/dL (ref 30.0–36.0)
MCV: 86.3 fL (ref 80.0–100.0)
Platelets: 338 10*3/uL (ref 150–400)
RBC: 3.79 MIL/uL — ABNORMAL LOW (ref 4.22–5.81)
RDW: 13.3 % (ref 11.5–15.5)
WBC: 7.4 10*3/uL (ref 4.0–10.5)
nRBC: 0 % (ref 0.0–0.2)

## 2024-02-04 LAB — BASIC METABOLIC PANEL WITH GFR
Anion gap: 10 (ref 5–15)
BUN: 15 mg/dL (ref 8–23)
CO2: 24 mmol/L (ref 22–32)
Calcium: 8.4 mg/dL — ABNORMAL LOW (ref 8.9–10.3)
Chloride: 102 mmol/L (ref 98–111)
Creatinine, Ser: 0.79 mg/dL (ref 0.61–1.24)
GFR, Estimated: >60 mL/min (ref >60)
Glucose, Bld: 90 mg/dL (ref 70–99)
Potassium: 4.2 mmol/L (ref 3.5–5.1)
Sodium: 136 mmol/L (ref 135–145)

## 2024-02-04 LAB — HEPARIN LEVEL (UNFRACTIONATED): Heparin Unfractionated: 0.3 [IU]/mL (ref 0.30–0.70)

## 2024-02-04 NOTE — Progress Notes (Signed)
 General Surgery Follow Up Note  Subjective:    Overnight Issues:   Objective:  Vital signs for last 24 hours: Temp:  [97.6 F (36.4 C)-98 F (36.7 C)] 98 F (36.7 C) (05/22 0804) Pulse Rate:  [71-101] 101 (05/22 0804) Resp:  [16-18] 16 (05/22 0435) BP: (116-139)/(62-89) 120/82 (05/22 0804) SpO2:  [89 %-95 %] 92 % (05/22 0804)  Hemodynamic parameters for last 24 hours:    Intake/Output from previous day: 05/21 0701 - 05/22 0700 In: 1348.3 [P.O.:600; I.V.:610.8; IV Piggyback:137.5] Out: 965 [Urine:925; Drains:40]  Intake/Output this shift: No intake/output data recorded.  Vent settings for last 24 hours:    Physical Exam:  Gen: comfortable, no distress Neuro: follows commands, alert, communicative HEENT: PERRL Neck: supple CV: RRR Pulm: unlabored breathing on RA Abd: soft, NT, incision clean, dry, intact, JP serous GU: urine clear and yellow, +spontaneous void Extr: wwp, no edema  Results for orders placed or performed during the hospital encounter of 01/26/24 (from the past 24 hours)  Basic metabolic panel     Status: Abnormal   Collection Time: 02/04/24  4:50 AM  Result Value Ref Range   Sodium 136 135 - 145 mmol/L   Potassium 4.2 3.5 - 5.1 mmol/L   Chloride 102 98 - 111 mmol/L   CO2 24 22 - 32 mmol/L   Glucose, Bld 90 70 - 99 mg/dL   BUN 15 8 - 23 mg/dL   Creatinine, Ser 1.61 0.61 - 1.24 mg/dL   Calcium  8.4 (L) 8.9 - 10.3 mg/dL   GFR, Estimated >09 >60 mL/min   Anion gap 10 5 - 15  CBC     Status: Abnormal   Collection Time: 02/04/24  4:50 AM  Result Value Ref Range   WBC 7.4 4.0 - 10.5 K/uL   RBC 3.79 (L) 4.22 - 5.81 MIL/uL   Hemoglobin 10.9 (L) 13.0 - 17.0 g/dL   HCT 45.4 (L) 09.8 - 11.9 %   MCV 86.3 80.0 - 100.0 fL   MCH 28.8 26.0 - 34.0 pg   MCHC 33.3 30.0 - 36.0 g/dL   RDW 14.7 82.9 - 56.2 %   Platelets 338 150 - 400 K/uL   nRBC 0.0 0.0 - 0.2 %  Heparin  level (unfractionated)     Status: None   Collection Time: 02/04/24  4:50 AM  Result  Value Ref Range   Heparin  Unfractionated 0.30 0.30 - 0.70 IU/mL    Assessment & Plan:  Present on Admission:  Primary hypertension  Mixed hyperlipidemia  HFrEF (heart failure with reduced ejection fraction) (HCC)  CAD (coronary artery disease)  Acute diverticulitis    LOS: 9 days   Additional comments:I reviewed the patient's new clinical lab test results.   and I reviewed the patients new imaging test results.    POD3 s/p open sigmoidectomy by Dr. Davonna Estes on 02/01/24 with intra-operative findings of malignancy  - Final path pending - CEA 3.3 - CT chest pending, called [facility] this AM to expedite read - Appears Oncology, Dr. Scherrie Curt, has already been notified - Cont abx, plan 5d post op - Cont JP, SS currently, may be able to come out prior to discharge. - Adv to FLD. Hold here until ROBF. Cont shakes.  - Consult to WOCN for new ostomy - Mobilize, PT - no PT f/u recommended - Pulm toilet   FEN - FLD, shakes, IVF per primary  VTE - SCDs, heparin  gtt ID - Zosyn  Foley - Spont void.    LV thrombus? PAD  HFrEF CAD HLD HTN Remote H/O polio   [name] Bamberg, MD Trauma & General Surgery Please use [email or web address] to contact on call provider  02/04/2024  *Care during the described time interval was provided by me. I have reviewed this patient's available data, including medical history, events of note, physical examination and test results as part of my evaluation.

## 2024-02-04 NOTE — Progress Notes (Signed)
 Physical Therapy Treatment Patient Details Name: [name] [name]. MRN: [id number] DOB: [birth year] Today's Date: 02/04/2024   History of Present Illness Pt is a 78 y.o. male presenting to [facility] ED on 01/26/24 with abdominal pain for 5 days; CT shows diverticulitis with diverticular abscess and contained perforation with leukocytosis. PMH is significant for HTN, HLD, CAD, and CHF.    PT Comments  Pt received in supine and agreeable to session. Pt reports abdominal pain, however states that it is improved from yesterday. Pt able to tolerate gait and stair trials with CGA for safety. Pt benefits from UE support for balance and pain management, so discussed use of cane at home with pt in agreement. Pt continues to benefit from PT services to progress toward functional mobility goals.     If plan is discharge home, recommend the following: Help with stairs or ramp for entrance;Assist for transportation;A little help with walking and/or transfers;A little help with bathing/dressing/bathroom   Can travel by private vehicle        Equipment Recommendations  None recommended by PT    Recommendations for Other Services       Precautions / Restrictions Precautions Precautions: None Precaution/Restrictions Comments: JP drain Restrictions Weight Bearing Restrictions Per Provider Order: No     Mobility  Bed Mobility Overal bed mobility: Modified Independent             General bed mobility comments: HOB elevated, increased time    Transfers Overall transfer level: Needs assistance Equipment used:  (IV pole) Transfers: Sit to/from Stand Sit to Stand: Supervision           General transfer comment: 1 UE on IV pole, the other on the bed with no LOB    Ambulation/Gait Ambulation/Gait assistance: Contact guard assist Gait Distance (Feet): 150 Feet Assistive device: IV Pole Gait Pattern/deviations: Step-through pattern, Decreased stance time - right, Narrow base of support,  Decreased stride length, Decreased step length - right, Trunk flexed Gait velocity: decreased     General Gait Details: slight instability, but no LOB. CGA for safety. Pt notes trunk flexion and is attempts to correct without cues   Stairs Stairs: Yes Stairs assistance: Contact guard assist Stair Management: Forwards, One rail Left Number of Stairs: 3 General stair comments: steady with cues for sequencing. CGA for safety   Wheelchair Mobility     Tilt Bed    Modified Rankin (Stroke Patients Only)       Balance Overall balance assessment: Needs assistance Sitting-balance support: No upper extremity supported, Feet supported Sitting balance-Leahy Scale: Good Sitting balance - Comments: sitting EOB   Standing balance support: During functional activity, Single extremity supported Standing balance-Leahy Scale: Fair Standing balance comment: with IV pole support                            Communication Communication Communication: No apparent difficulties  Cognition Arousal: Alert Behavior During Therapy: WFL for tasks assessed/performed   PT - Cognitive impairments: No apparent impairments                         Following commands: Intact      Cueing Cueing Techniques: Verbal cues  Exercises      General Comments        Pertinent Vitals/Pain Pain Assessment Pain Assessment: Faces Faces Pain Scale: Hurts little more Pain Location: abdomen Pain Descriptors / Indicators: Discomfort, Sore, Guarding, Grimacing Pain  Intervention(s): Monitored during session, Limited activity within patient's tolerance     PT Goals (current goals can now be found in the care plan section) Acute Rehab PT Goals Patient Stated Goal: to be able to get back to working PT Goal Formulation: With patient Time For Goal Achievement: 02/16/24 Progress towards PT goals: Progressing toward goals    Frequency    Min 2X/week       AM-PAC PT "6 Clicks"  Mobility   Outcome Measure  Help needed turning from your back to your side while in a flat bed without using bedrails?: None Help needed moving from lying on your back to sitting on the side of a flat bed without using bedrails?: None Help needed moving to and from a bed to a chair (including a wheelchair)?: A Little Help needed standing up from a chair using your arms (e.g., wheelchair or bedside chair)?: A Little Help needed to walk in hospital room?: A Little Help needed climbing 3-5 steps with a railing? : A Little 6 Click Score: 20    End of Session Equipment Utilized During Treatment: Gait belt Activity Tolerance: Patient tolerated treatment well;Patient limited by pain Patient left: with call bell/phone within reach;in bed Nurse Communication: Mobility status;Patient requests pain meds PT Visit Diagnosis: Unsteadiness on feet (R26.81);Muscle weakness (generalized) (M62.81)     Time: 1610-9604 PT Time Calculation (min) (ACUTE ONLY): 16 min  Charges:    $Gait Training: 8-22 mins PT General Charges $$ ACUTE PT VISIT: 1 Visit                     Michaelle Adolphus, PTA Acute Rehabilitation Services Secure Chat Preferred  [phone]    Michaelle Adolphus 02/04/2024, 10:14 AM

## 2024-02-04 NOTE — Plan of Care (Signed)
  Problem: Education: Goal: Knowledge of General Education information will improve Description: Including pain rating scale, medication(s)/side effects and non-pharmacologic comfort measures Outcome: Progressing   Problem: Health Behavior/Discharge Planning: Goal: Ability to manage health-related needs will improve Outcome: Progressing   Problem: Activity: Goal: Risk for activity intolerance will decrease Outcome: Progressing   Problem: Nutrition: Goal: Adequate nutrition will be maintained Outcome: Progressing   Problem: Elimination: Goal: Will not experience complications related to bowel motility Outcome: Progressing   Problem: Pain Managment: Goal: General experience of comfort will improve and/or be controlled Outcome: Progressing

## 2024-02-04 NOTE — Progress Notes (Signed)
 PROGRESS NOTE    [name] [name].  ZOX:[id number] DOB: 1946/04/17 [name]: 01/26/2024 PCP: [name] Jules, FNP   Brief Narrative:  78 y.o. male with medical history significant of hypertension, hyperlipidemia, CAD status post stent, chronic systolic CHF presented with worsening abdominal pain.  He was found to have diverticulitis with diverticular abscess and a contained colonic wall perforation.  He was hospitalized for further management.  No improvement noted with conservative management.  Subsequently taken to the OR by general surgery.      Assessment & Plan:  Acute Diverticulitis with contained perforation and abscess CT showed diverticulitis with diverticular abscess and contained perforation with leukocytosis. General surgery and IR consulted.  IR could not find a window to place a drain.   Despite conservative management patient did not have any improvement.  Subsequently taken to the OR on 5/19.  Underwent exploratory laparotomy and sigmoidectomy.   Has a colostomy.   Was found to have sigmoid mass.  Pathology is pending.  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 General surgery continues to follow.  Remains on Zosyn .  Plan to continue antibiotics for 5 days postoperatively. Diet advancement per general surgery.  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  Pathology report is pending. CT of the abdomen pelvis did not show any enlarged lymph nodes.  Moderately enlarged prostate gland was noted. CT of the chest is pending.   Of note patient did undergo CT angiogram chest abdomen pelvis on 5/13 which showed multiple pulmonary nodules. CEA noted to be 3.3. Patient has been made an appointment with medical oncology.  Chronic systolic CHF/LV thrombus Echocardiogram showed LVEF of 25 to 30%. Patient was seen by cardiology.  There was concern for LV thrombus.  Patient was started on IV heparin .  To be transitioned to Eliquis when cleared by general  surgery. Cardiology to schedule outpatient follow-up. Patient noted to be on ARB and beta-blocker.  Seems to be stable from a volume standpoint. Blood pressure was noted to be low yesterday so antihypertensives were held.  ARB and beta-blocker on hold for now.  Blood pressures have improved.  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  On aspirin  and statin for his CAD. Will need to follow-up with vascular surgery outpatient  Resume aspirin  when cleared to do so by general surgery.  Essential hypertension Continue home losartan    Hyperlipidemia Continue home rosuvastatin    CAD Status post stents Continue home ASA, rosuvastatin   Normocytic anemia Patient was noted to have small amount of blood in the ostomy bag yesterday.  No significant bleeding noted.  Continue to monitor for now.   DVT prophylaxis: On IV heparin  Code Status: full Family Communication:none Disposition Plan: Discharge home when cleared by general surgery   Consultants:  surgery  Procedures:none Antimicrobials: zosyn   Subjective: Feels well.  Still not passing any gas from ostomy.  No bowel movements.  Denies any significant pain.  No nausea or vomiting.  No Shortness of breath.  Objective: Vitals:   02/03/24 1735 02/03/24 1953 02/04/24 0435 02/04/24 0804  BP: 139/89 128/75 119/64 120/82  Pulse: 96 87 71 (!) 101  Resp: 18 16 16    Temp: 97.9 F (36.6 C) 98 F (36.7 C) 97.6 F (36.4 C) 98 F (36.7 C)  TempSrc: Oral     SpO2: (!) 89% 91% 95% 92%  Weight:      Height:  Intake/Output Summary (Last 24 hours) at 02/04/2024 1042 Last data filed at 02/04/2024 0625 Gross per 24 hour  Intake 1108.29 ml  Output 765 ml  Net 343.29 ml    Filed Weights   01/26/24 1245 02/01/24 1143  Weight: 67.1 kg 67.1 kg    Examination:  General appearance: Awake alert.  In no distress Resp: Clear to auscultation bilaterally.  Normal  effort Cardio: S1-S2 is normal regular.  No S3-S4.  No rubs murmurs or bruit GI: Abdomen is soft.  Ostomy is noted.  Bowel sounds present. Moving all of his extremities.   Data Reviewed: I have personally reviewed following labs and imaging studies  CBC: Recent Labs  Lab 01/31/24 1025 02/01/24 0727 02/02/24 0744 02/03/24 0700 02/04/24 0450  WBC 9.9 9.9 11.4* 10.9* 7.4  HGB 11.5* 11.5* 11.4* 11.2* 10.9*  HCT 34.6* 34.8* 34.2* 32.9* 32.7*  MCV 85.4 87.2 85.3 85.2 86.3  PLT 290 261 307 332 338   Basic Metabolic Panel: Recent Labs  Lab 01/31/24 1025 02/01/24 0727 02/02/24 0744 02/03/24 0700 02/04/24 0450  NA 133* 131* 133* 133* 136  K 4.5 4.1 4.3 4.2 4.2  CL 99 101 101 99 102  CO2 26 21* 22 24 24   GLUCOSE 101* 88 127* 107* 90  BUN 7* 12 17 17 15   CREATININE 0.79 0.77 0.77 0.79 0.79  CALCIUM  8.4* 8.5* 8.2* 8.2* 8.4*  MG 2.1  --   --   --   --    GFR: Estimated Creatinine Clearance: 70 mL/min (by C-G formula based on SCr of 0.79 mg/dL). Liver Function Tests: Recent Labs  Lab 02/03/24 0700  AST 23  ALT 16  ALKPHOS 47  BILITOT 0.5  PROT 5.5*  ALBUMIN 2.3*    Recent Results (from the past 240 hours)  Culture, blood (routine x 2)     Status: None   Collection Time: 01/26/24  3:15 PM   Specimen: BLOOD RIGHT FOREARM  Result Value Ref Range Status   Specimen Description BLOOD RIGHT FOREARM  Final   Special Requests   Final    BOTTLES DRAWN AEROBIC AND ANAEROBIC Blood Culture adequate volume   Culture   Final    NO GROWTH 5 DAYS Performed at [facility], [address], [place], [place] [address]    Report Status 01/31/2024 FINAL  Final  Culture, blood (routine x 2)     Status: None   Collection Time: 01/26/24  3:35 PM   Specimen: BLOOD RIGHT WRIST  Result Value Ref Range Status   Specimen Description BLOOD RIGHT WRIST  Final   Special Requests   Final    BOTTLES DRAWN AEROBIC AND ANAEROBIC Blood Culture adequate volume   Culture   Final    NO  GROWTH 5 DAYS Performed at [facility], [address] [place], [place] [address]    Report Status 01/31/2024 FINAL  Final  Surgical pcr screen     Status: Abnormal   Collection Time: 02/01/24 11:48 AM   Specimen: Nasal Mucosa; Nasal Swab  Result Value Ref Range Status   MRSA, PCR NEGATIVE NEGATIVE Final   Staphylococcus aureus POSITIVE (A) NEGATIVE Final    Comment: (NOTE) The Xpert SA Assay (FDA approved for NASAL specimens in patients 74 years of age and older), is one component of a comprehensive surveillance program. It is not intended to diagnose infection nor to guide or monitor treatment. Performed at [facility], [address], [place], [place] [address]  Radiology Studies: No results found.   Scheduled Meds:  acetaminophen   1,000 mg Oral Q6H   Chlorhexidine  Gluconate Cloth  6 each Topical Daily   feeding supplement  1 Container Oral TID BM   mupirocin  ointment  1 Application Nasal BID   polyethylene glycol  17 g Oral Daily   rosuvastatin   20 mg Oral Daily   sodium chloride  flush  3 mL Intravenous Q12H   Continuous Infusions:  heparin  1,550 Units/hr (02/03/24 2240)   piperacillin -tazobactam (ZOSYN )  IV 3.375 g (02/04/24 0445)     LOS: 9 days     Maylene Spear, MD  02/04/2024, 10:42 AM

## 2024-02-04 NOTE — Progress Notes (Signed)
 PHARMACY - ANTICOAGULATION CONSULT NOTE  Pharmacy Consult for heparin  Indication: High risk for LV thrombus  Allergies  Allergen Reactions   Carvedilol  Diarrhea   Lipitor [Atorvastatin] Other (See Comments)    "Makes pt feel funky"   Plavix  [Clopidogrel ] Rash    Patient Measurements: Height: 5' 6.5" (168.9 cm) Weight: 67.1 kg (148 lb) IBW/kg (Calculated) : 64.95 HEPARIN  DW (KG): 67.1  Vital Signs: Temp: 98 F (36.7 C) (05/22 0804) BP: 120/82 (05/22 0804) Pulse Rate: 101 (05/22 0804)  Labs: Recent Labs    02/02/24 0744 02/03/24 0700 02/04/24 0450  HGB 11.4* 11.2* 10.9*  HCT 34.2* 32.9* 32.7*  PLT 307 332 338  HEPARINUNFRC 0.33 0.50 0.30  CREATININE 0.77 0.79 0.79    Estimated Creatinine Clearance: 70 mL/min (by C-G formula based on SCr of 0.79 mg/dL).  Assessment: 78 yo male with sigmoid diverticulitis with perforation and abscess s/p open sigmoidectomy with colostomy on 5/19. Echo results show high risk LV thrombus and pharmacy consulted to dose heparin .   Heparin  level therapeutic and low normal, CBC stable, no bleeding reported.  Goal of Therapy:  Heparin  level 0.3-0.7 units/ml Monitor platelets by anticoagulation protocol: Yes   Plan:  Increase heparin  infusion slightly to 1600 units/hr Daily heparin  level, CBC Monitor for s/sx of bleeding F/u future surgical plans vs switch to Eliquis as applicable  Elizzie Westergard D. Marikay Show, PharmD, BCPS, BCCCP 02/04/2024, 12:26 PM

## 2024-02-05 ENCOUNTER — Other Ambulatory Visit (HOSPITAL_COMMUNITY): Payer: Self-pay

## 2024-02-05 ENCOUNTER — Telehealth (HOSPITAL_COMMUNITY): Payer: Self-pay | Admitting: Pharmacy Technician

## 2024-02-05 DIAGNOSIS — C189 Malignant neoplasm of colon, unspecified: Secondary | ICD-10-CM | POA: Diagnosis not present

## 2024-02-05 DIAGNOSIS — I513 Intracardiac thrombosis, not elsewhere classified: Secondary | ICD-10-CM | POA: Diagnosis not present

## 2024-02-05 DIAGNOSIS — K5792 Diverticulitis of intestine, part unspecified, without perforation or abscess without bleeding: Secondary | ICD-10-CM | POA: Diagnosis not present

## 2024-02-05 DIAGNOSIS — I5020 Unspecified systolic (congestive) heart failure: Secondary | ICD-10-CM | POA: Diagnosis not present

## 2024-02-05 LAB — BASIC METABOLIC PANEL WITH GFR
Anion gap: 8 (ref 5–15)
BUN: 9 mg/dL (ref 8–23)
CO2: 26 mmol/L (ref 22–32)
Calcium: 8.7 mg/dL — ABNORMAL LOW (ref 8.9–10.3)
Chloride: 102 mmol/L (ref 98–111)
Creatinine, Ser: 0.82 mg/dL (ref 0.61–1.24)
GFR, Estimated: >60 mL/min (ref >60)
Glucose, Bld: 104 mg/dL — ABNORMAL HIGH (ref 70–99)
Potassium: 4.1 mmol/L (ref 3.5–5.1)
Sodium: 136 mmol/L (ref 135–145)

## 2024-02-05 LAB — CBC
HCT: 36.7 % — ABNORMAL LOW (ref 39.0–52.0)
Hemoglobin: 12.1 g/dL — ABNORMAL LOW (ref 13.0–17.0)
MCH: 28.6 pg (ref 26.0–34.0)
MCHC: 33 g/dL (ref 30.0–36.0)
MCV: 86.8 fL (ref 80.0–100.0)
Platelets: 376 10*3/uL (ref 150–400)
RBC: 4.23 MIL/uL (ref 4.22–5.81)
RDW: 13.7 % (ref 11.5–15.5)
WBC: 7.3 10*3/uL (ref 4.0–10.5)
nRBC: 0 % (ref 0.0–0.2)

## 2024-02-05 LAB — SURGICAL PATHOLOGY

## 2024-02-05 MED ORDER — METOPROLOL TARTRATE 12.5 MG HALF TABLET
12.5000 mg | ORAL_TABLET | Freq: Two times a day (BID) | ORAL | Status: DC
  Administered 2024-02-05 – 2024-02-09 (×9): 12.5 mg via ORAL
  Filled 2024-02-05 (×9): qty 1

## 2024-02-05 MED ORDER — APIXABAN 5 MG PO TABS
5.0000 mg | ORAL_TABLET | Freq: Two times a day (BID) | ORAL | Status: DC
  Administered 2024-02-05 – 2024-02-09 (×9): 5 mg via ORAL
  Filled 2024-02-05 (×9): qty 1

## 2024-02-05 NOTE — Consult Note (Signed)
 WOC Nurse ostomy follow up Ongoing education for ostomy care. Patient has not stool output but has had flatus.   Stoma type/location: LLQ colostomy Stomal assessment/size: 1 1/2" slightly budded from 12 to 6 o'clock but flush from 8 to 11 o'clock Peristomal assessment:  midline staple line   Treatment options for stomal/peristomal skin: barrier ring and 2piece convex pouch system.  Output only blood tinged liquid in pouch, he states he has had flatus and it causes discomfort Ostomy pouching: 2pc. Convex with barrier ring  Education provided:We perform pouch change today.   Since he is complaining of flatus, I walk him through "burping" the bag to release gas.  He is able to simulate without difficulty   We measure stoma and patient is able to cut barrier  to fit. He assembles pouch and barrier  With assistance, he applies barrier ring around stoma.  He then applies pouch system and smooths it to fit.  He warms pouch with his hand to secure fit. We discuss twice weekly pouch changes, showering with or without pouch on.  He has experienced pain and says nurses "aren't bringing it on time"  I explain that he will need to call the nurse to request PRN analgesia.  I explain that his pain medications can slow motility and getting up and walking will also help with motility.  He verbalizes understanding.  Enrolled patient in [place] Secure Start Discharge program: Yes earlier in the week.  Will follow.  Branda Cain MSN, RN, FNP-BC CWON Wound, Ostomy, Continence Nurse Outpatient [facility] [phone] Pager [phone]

## 2024-02-05 NOTE — Progress Notes (Signed)
 Patient had a bowel movement this afternoon around 5pm that filled the ostomy bag. It was soft and dark brown.

## 2024-02-05 NOTE — Telephone Encounter (Signed)
 Patient Product/process development scientist completed.    The patient is insured through [facility]. Patient has Medicare and is not eligible for a copay card, but may be able to apply for patient assistance or Medicare RX Payment Plan (Patient Must reach out to their plan, if eligible for payment plan), if available.    Ran test claim for Eliquis 5 mg and the current 30 day co-pay is $378.48 due to a deductible.  Will be $45.00 once deductible is met.   This test claim was processed through [facility]- copay amounts may vary at other pharmacies due to pharmacy/plan contracts, or as the patient moves through the different stages of their insurance plan.     Morgan Arab, CPHT Pharmacy Technician III Certified Patient Advocate [facility] Patient Advocate Team Direct Number: [phone]  Fax: [phone]

## 2024-02-05 NOTE — Progress Notes (Addendum)
 PHARMACY - ANTICOAGULATION CONSULT NOTE  Pharmacy Consult for heparin  - apixaban Indication: Possible early forming LV thrombus  Allergies  Allergen Reactions   Carvedilol  Diarrhea   Lipitor [Atorvastatin] Other (See Comments)    "Makes pt feel funky"   Plavix  [Clopidogrel ] Rash    Patient Measurements: Height: 5' 6.5" (168.9 cm) Weight: 67.1 kg (148 lb) IBW/kg (Calculated) : 64.95 HEPARIN  DW (KG): 67.1  Vital Signs: Temp: 97.7 F (36.5 C) (05/23 0803) Temp Source: Oral (05/23 0803) BP: 131/68 (05/23 0803) Pulse Rate: 86 (05/23 0803)  Labs: Recent Labs    02/03/24 0700 02/04/24 0450  HGB 11.2* 10.9*  HCT 32.9* 32.7*  PLT 332 338  HEPARINUNFRC 0.50 0.30  CREATININE 0.79 0.79    Estimated Creatinine Clearance: 70 mL/min (by C-G formula based on SCr of 0.79 mg/dL).  Assessment: 78 yo male with sigmoid diverticulitis with perforation and abscess s/p open sigmoidectomy with colostomy on 5/19. Echo cannot r/o early forming LV thrombus and pharmacy consulted to dose heparin .   5/23 Surgery has cleared pt to be transitioned to Eliquis.   Eliquis and Xarelto copay $378.48 ($333.48 deductible and $45 copay)   Goal of Therapy:  Treatment of possible LV thrombus Monitor platelets by anticoagulation protocol: Yes   Plan:  Eliquis 5mg  po BID D/c heparin  when first dose of Eliquis given Plan to educate pt on Eliquis when able  Enrigue Harvard, PharmD, BCPS Please see amion for complete clinical pharmacist phone list 02/05/2024, 11:04 AM

## 2024-02-05 NOTE — Progress Notes (Signed)
   General Surgery Follow Up Note  Subjective:    Overnight Issues:  NAEO. Pain currently controlled. Having flatus but no stool yet. Tolerating FLD but not eating much. Denies nausea, vomiting, or belching, has been walking the halls and doing the stairs. Lives alone. Working with WOC but has trouble seeing his stoma, plans to try to perform ostomy care in the mirror today or tomorrow.   Objective:  Vital signs for last 24 hours: Temp:  [97.6 F (36.4 C)-98.4 F (36.9 C)] 97.7 F (36.5 C) (05/23 0803) Pulse Rate:  [82-87] 86 (05/23 0803) Resp:  [18] 18 (05/23 0420) BP: (117-131)/(62-68) 131/68 (05/23 0803) SpO2:  [91 %-97 %] 95 % (05/23 0803)  Hemodynamic parameters for last 24 hours:    Intake/Output from previous day: 05/22 0701 - 05/23 0700 In: 304.7 [P.O.:100; I.V.:143.4; IV Piggyback:61.3] Out: 525 [Urine:500; Drains:25]  Intake/Output this shift: No intake/output data recorded.  Vent settings for last 24 hours:    Physical Exam:  Gen: comfortable, no distress Neuro: follows commands, alert, communicative HEENT: PERRL Neck: supple CV: RRR Pulm: unlabored breathing on RA Abd: soft, NT, incision clean, dry, intact, JP serous GU: urine clear and yellow, +spontaneous void Extr: wwp, no edema  No results found for this or any previous visit (from the past 24 hours).   Assessment & Plan:  Present on Admission:  Primary hypertension  Mixed hyperlipidemia  HFrEF (heart failure with reduced ejection fraction) (HCC)  CAD (coronary artery disease)  Acute diverticulitis    LOS: 10 days   Additional comments:I reviewed the patient's new clinical lab test results.   and I reviewed the patients new imaging test results.    POD4 s/p open sigmoidectomy by Dr. 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 Scherrie Curt, has been notified and pt has outpatient appointment - Cont abx, plan 5d post op - Cont JP, SS currently, may be able to come out prior to discharge. - Adv to Soft diet. Cont shakes.  - WOCN following for new ostomy - Mobilize, PT - no PT f/u recommended - Pulm toilet   FEN - soft, shakes, IVF per primary  VTE - SCDs, heparin  gtt; hgb stable, ok to resume Eliquis from CCS standpoint ID - Zosyn  Foley - Spont void.    LV thrombus? PAD HFrEF CAD HLD HTN Remote H/O polio   [name] [name], [facility] Please see Amion for pager number during day hours 7:00am-4:30pm       02/05/2024  *Care during the described time interval was provided by me. I have reviewed this patient's available data, including medical history, events of note, physical examination and test results as part of my evaluation.

## 2024-02-05 NOTE — Plan of Care (Signed)
  Problem: Education: Goal: Knowledge of General Education information will improve Description: Including pain rating scale, medication(s)/side effects and non-pharmacologic comfort measures Outcome: Progressing   Problem: Activity: Goal: Risk for activity intolerance will decrease Outcome: Progressing   Problem: Nutrition: Goal: Adequate nutrition will be maintained Outcome: Progressing   Problem: Elimination: Goal: Will not experience complications related to urinary retention Outcome: Progressing   Problem: Pain Managment: Goal: General experience of comfort will improve and/or be controlled Outcome: Progressing

## 2024-02-05 NOTE — Progress Notes (Signed)
 PROGRESS NOTE    [name] [name].  WUJ:[id number] DOB: 29-Apr-1946 [name]: 01/26/2024 PCP: Galvin Jules, FNP   Brief Narrative:  78 y.o. male with medical history significant of hypertension, hyperlipidemia, CAD status post stent, chronic systolic CHF presented with worsening abdominal pain.  He was found to have diverticulitis with diverticular abscess and a contained colonic wall perforation.  He was hospitalized for further management.  No improvement noted with conservative management.  Subsequently taken to the OR by general surgery.      Assessment & Plan:  Acute Diverticulitis with contained perforation and abscess CT showed diverticulitis with diverticular abscess and contained perforation with leukocytosis. General surgery and IR consulted.  IR could not find a window to place a drain.   Despite conservative management patient did not have any improvement.  Subsequently taken to the OR on 5/19.  Underwent exploratory laparotomy and sigmoidectomy.   Has a colostomy.   Was found to have sigmoid mass.  See below General surgery continues to follow.  Remains on Zosyn .  Plan to continue antibiotics for 5 days postoperatively. Diet advancement per general surgery.  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  Pathology is also positive for adenocarcinoma.  Pathology report suggested cancer was seen in the surgical margins as well.  CT of the abdomen pelvis did not show any enlarged lymph nodes.  Moderately enlarged prostate gland was noted. Patient underwent CT chest which showed Tiny bilateral noncalcified pulmonary nodules in the upper lobe.  These could represent past inflammation but in the setting of newly diagnosed colon cancer metastasis cannot be ruled out. Of note patient did undergo CT angiogram chest abdomen pelvis on 5/13 which showed multiple pulmonary nodules. CEA noted to be 3.3. Patient has been made an appointment with Dr. Scherrie Curt on June 9 at 1:40  PM.  Chronic systolic CHF/LV thrombus Echocardiogram showed LVEF of 25 to 30%. Patient was seen by cardiology.  There was concern for LV thrombus.  Patient was started on IV heparin .  To be transitioned to Eliquis when cleared by general surgery. Cardiology to schedule outpatient follow-up. ARB and beta-blocker were held due to borderline low blood pressures.  Blood pressure has improved.  Could reattempted beta-blocker to begin with.  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  On aspirin  and statin for his CAD. Will need to follow-up with vascular surgery outpatient  Resume aspirin  when cleared to do so by general surgery.  Essential hypertension Continue home losartan    Hyperlipidemia Continue home rosuvastatin    CAD Status post stents Continue home ASA, rosuvastatin   Normocytic anemia Patient was noted to have small amount of blood in the ostomy bag yesterday.  No significant bleeding noted.  Continue to monitor for now. Labs are pending from today.   DVT prophylaxis: On IV heparin  Code Status: full Family Communication:none Disposition Plan: Discharge home when cleared by general surgery   Consultants:  surgery  Procedures: Exploratory laparotomy, colon resection Antimicrobials: zosyn   Subjective: Did have some pain issues overnight secondary to flatus.  This improved after he was able to burp and gas was released from the ostomy bag.  Denies any nausea vomiting.  Continues to tolerate liquid diet.  Diet advancement deferred to general surgery.  Objective: Vitals:   02/04/24 0804 02/04/24 2002 02/05/24 0420 02/05/24 0803  BP: 120/82 117/62 123/68 131/68  Pulse: (!) 101 87 82 86  Resp:  18 18  Temp: 98 F (36.7 C) 98.4 F (36.9 C) 97.6 F (36.4 C) 97.7 F (36.5 C)  TempSrc:    Oral  SpO2: 92% 97% 91% 95%  Weight:      Height:        Intake/Output Summary (Last 24 hours) at  02/05/2024 0903 Last data filed at 02/05/2024 0420 Gross per 24 hour  Intake 304.73 ml  Output 525 ml  Net -220.27 ml    Filed Weights   01/26/24 1245 02/01/24 1143  Weight: 67.1 kg 67.1 kg    Examination:  General appearance: Awake alert.  In no distress Resp: Clear to auscultation bilaterally.  Normal effort Cardio: S1-S2 is normal regular.  No S3-S4.  No rubs murmurs or bruit GI: Abdomen is soft.  Bowel sounds present.  Ostomy bag noted.  Data Reviewed: I have personally reviewed following labs and imaging studies  CBC: Recent Labs  Lab 01/31/24 1025 02/01/24 0727 02/02/24 0744 02/03/24 0700 02/04/24 0450  WBC 9.9 9.9 11.4* 10.9* 7.4  HGB 11.5* 11.5* 11.4* 11.2* 10.9*  HCT 34.6* 34.8* 34.2* 32.9* 32.7*  MCV 85.4 87.2 85.3 85.2 86.3  PLT 290 261 307 332 338   Basic Metabolic Panel: Recent Labs  Lab 01/31/24 1025 02/01/24 0727 02/02/24 0744 02/03/24 0700 02/04/24 0450  NA 133* 131* 133* 133* 136  K 4.5 4.1 4.3 4.2 4.2  CL 99 101 101 99 102  CO2 26 21* 22 24 24   GLUCOSE 101* 88 127* 107* 90  BUN 7* 12 17 17 15   CREATININE 0.79 0.77 0.77 0.79 0.79  CALCIUM  8.4* 8.5* 8.2* 8.2* 8.4*  MG 2.1  --   --   --   --    GFR: Estimated Creatinine Clearance: 70 mL/min (by C-G formula based on SCr of 0.79 mg/dL). Liver Function Tests: Recent Labs  Lab 02/03/24 0700  AST 23  ALT 16  ALKPHOS 47  BILITOT 0.5  PROT 5.5*  ALBUMIN 2.3*    Recent Results (from the past 240 hours)  Culture, blood (routine x 2)     Status: None   Collection Time: 01/26/24  3:15 PM   Specimen: BLOOD RIGHT FOREARM  Result Value Ref Range Status   Specimen Description BLOOD RIGHT FOREARM  Final   Special Requests   Final    BOTTLES DRAWN AEROBIC AND ANAEROBIC Blood Culture adequate volume   Culture   Final    NO GROWTH 5 DAYS Performed at [facility], [address], [place], [place] [address]    Report Status 01/31/2024 FINAL  Final  Culture, blood (routine x 2)      Status: None   Collection Time: 01/26/24  3:35 PM   Specimen: BLOOD RIGHT WRIST  Result Value Ref Range Status   Specimen Description BLOOD RIGHT WRIST  Final   Special Requests   Final    BOTTLES DRAWN AEROBIC AND ANAEROBIC Blood Culture adequate volume   Culture   Final    NO GROWTH 5 DAYS Performed at [facility], [address], [place], [place] [address]    Report Status 01/31/2024 FINAL  Final  Surgical pcr screen     Status: Abnormal   Collection Time: 02/01/24 11:48 AM   Specimen: Nasal Mucosa; Nasal Swab  Result Value Ref Range Status   MRSA, PCR NEGATIVE NEGATIVE Final   Staphylococcus aureus POSITIVE (A) NEGATIVE Final    Comment: (NOTE) The Xpert SA Assay (FDA approved for NASAL specimens in patients 22 years  of age and older), is one component of a comprehensive surveillance program. It is not intended to diagnose infection nor to guide or monitor treatment. Performed at [facility], [address] [place], [place] [address]       Radiology Studies: No results found.   Scheduled Meds:  acetaminophen   1,000 mg Oral Q6H   Chlorhexidine  Gluconate Cloth  6 each Topical Daily   feeding supplement  1 Container Oral TID BM   mupirocin  ointment  1 Application Nasal BID   polyethylene glycol  17 g Oral Daily   rosuvastatin   20 mg Oral Daily   sodium chloride  flush  3 mL Intravenous Q12H   Continuous Infusions:  heparin  1,600 Units/hr (02/05/24 0737)   piperacillin -tazobactam (ZOSYN )  IV 3.375 g (02/05/24 0601)     LOS: 10 days     Maylene Spear, MD  02/05/2024, 9:03 AM

## 2024-02-06 ENCOUNTER — Other Ambulatory Visit (HOSPITAL_COMMUNITY): Payer: Self-pay

## 2024-02-06 DIAGNOSIS — K5792 Diverticulitis of intestine, part unspecified, without perforation or abscess without bleeding: Secondary | ICD-10-CM | POA: Diagnosis not present

## 2024-02-06 DIAGNOSIS — I513 Intracardiac thrombosis, not elsewhere classified: Secondary | ICD-10-CM | POA: Diagnosis not present

## 2024-02-06 DIAGNOSIS — C189 Malignant neoplasm of colon, unspecified: Secondary | ICD-10-CM | POA: Diagnosis not present

## 2024-02-06 DIAGNOSIS — I5020 Unspecified systolic (congestive) heart failure: Secondary | ICD-10-CM | POA: Diagnosis not present

## 2024-02-06 LAB — CBC
HCT: 38.9 % — ABNORMAL LOW (ref 39.0–52.0)
Hemoglobin: 12.9 g/dL — ABNORMAL LOW (ref 13.0–17.0)
MCH: 28.6 pg (ref 26.0–34.0)
MCHC: 33.2 g/dL (ref 30.0–36.0)
MCV: 86.3 fL (ref 80.0–100.0)
Platelets: 403 10*3/uL — ABNORMAL HIGH (ref 150–400)
RBC: 4.51 MIL/uL (ref 4.22–5.81)
RDW: 13.6 % (ref 11.5–15.5)
WBC: 9.6 10*3/uL (ref 4.0–10.5)
nRBC: 0 % (ref 0.0–0.2)

## 2024-02-06 MED ORDER — APIXABAN 5 MG PO TABS
5.0000 mg | ORAL_TABLET | Freq: Two times a day (BID) | ORAL | 2 refills | Status: DC
  Filled 2024-02-06: qty 60, 30d supply, fill #0

## 2024-02-06 MED ORDER — OXYCODONE HCL 5 MG PO TABS
5.0000 mg | ORAL_TABLET | Freq: Four times a day (QID) | ORAL | 0 refills | Status: AC | PRN
  Filled 2024-02-06: qty 20, 3d supply, fill #0

## 2024-02-06 NOTE — Plan of Care (Signed)
  Problem: Activity: Goal: Risk for activity intolerance will decrease Outcome: Progressing   Problem: Nutrition: Goal: Adequate nutrition will be maintained Outcome: Progressing   Problem: Elimination: Goal: Will not experience complications related to bowel motility Outcome: Progressing Goal: Will not experience complications related to urinary retention Outcome: Progressing   Problem: Safety: Goal: Ability to remain free from injury will improve Outcome: Progressing

## 2024-02-06 NOTE — Progress Notes (Signed)
 General Surgery Follow Up Note  Subjective:    Overnight Issues:  NAEO. Pain currently controlled.  Having flatus and BMs now.  Tolerating soft diet. Denies nausea, vomiting, or belching, has been walking the halls and doing the stairs. Lives alone. Working with WOC but has trouble seeing his stoma  Objective:  Vital signs for last 24 hours: Temp:  [97.7 F (36.5 C)-98.4 F (36.9 C)] 97.9 F (36.6 C) (05/24 0806) Pulse Rate:  [67-88] 88 (05/24 0806) Resp:  [16-18] 18 (05/24 0806) BP: (120-135)/(66-83) 120/66 (05/24 0806) SpO2:  [93 %-96 %] 94 % (05/24 0806)  Hemodynamic parameters for last 24 hours:    Intake/Output from previous day: 05/23 0701 - 05/24 0700 In: -  Out: 1230 [Urine:800; Drains:30; Stool:400]  Intake/Output this shift: Total I/O In: -  Out: 300 [Urine:300]  Vent settings for last 24 hours:    Physical Exam:  Gen: comfortable, no distress Neuro: follows commands, alert, communicative HEENT: PERRL Neck: supple CV: RRR Pulm: unlabored breathing on RA Abd: soft, NT, incision clean, dry, intact, JP serous GU: urine clear and yellow, +spontaneous void Extr: wwp, no edema  Results for orders placed or performed during the hospital encounter of 01/26/24 (from the past 24 hours)  Basic metabolic panel     Status: Abnormal   Collection Time: 02/05/24 11:54 AM  Result Value Ref Range   Sodium 136 135 - 145 mmol/L   Potassium 4.1 3.5 - 5.1 mmol/L   Chloride 102 98 - 111 mmol/L   CO2 26 22 - 32 mmol/L   Glucose, Bld 104 (H) 70 - 99 mg/dL   BUN 9 8 - 23 mg/dL   Creatinine, Ser 1.30 0.61 - 1.24 mg/dL   Calcium  8.7 (L) 8.9 - 10.3 mg/dL   GFR, Estimated >86 >57 mL/min   Anion gap 8 5 - 15  CBC     Status: Abnormal   Collection Time: 02/05/24 11:54 AM  Result Value Ref Range   WBC 7.3 4.0 - 10.5 K/uL   RBC 4.23 4.22 - 5.81 MIL/uL   Hemoglobin 12.1 (L) 13.0 - 17.0 g/dL   HCT 84.6 (L) 96.2 - 95.2 %   MCV 86.8 80.0 - 100.0 fL   MCH 28.6 26.0 - 34.0 pg    MCHC 33.0 30.0 - 36.0 g/dL   RDW 84.1 32.4 - 40.1 %   Platelets 376 150 - 400 K/uL   nRBC 0.0 0.0 - 0.2 %  CBC     Status: Abnormal   Collection Time: 02/06/24  4:37 AM  Result Value Ref Range   WBC 9.6 4.0 - 10.5 K/uL   RBC 4.51 4.22 - 5.81 MIL/uL   Hemoglobin 12.9 (L) 13.0 - 17.0 g/dL   HCT 02.7 (L) 25.3 - 66.4 %   MCV 86.3 80.0 - 100.0 fL   MCH 28.6 26.0 - 34.0 pg   MCHC 33.2 30.0 - 36.0 g/dL   RDW 40.3 47.4 - 25.9 %   Platelets 403 (H) 150 - 400 K/uL   nRBC 0.0 0.0 - 0.2 %     Assessment & Plan:  Present on Admission:  Primary hypertension  Mixed hyperlipidemia  HFrEF (heart failure with reduced ejection fraction) (HCC)  CAD (coronary artery disease)  Acute diverticulitis    LOS: 11 days   Additional comments:I reviewed the patient's new clinical lab test results.   and I reviewed the patients new imaging test results.    POD5 s/p open sigmoidectomy by Dr. 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 Scherrie Curt, has been notified and pt has outpatient appointment - Cont abx, plan 5d post op - Cont JP, SS currently, may be able to come out prior to discharge. - Cont soft diet today. Cont shakes.  - WOCN following for new ostomy - Mobilize, PT - no PT f/u recommended - Pulm toilet   FEN - soft, shakes, IVF per primary  VTE - SCDs, heparin  gtt; hgb stable, ok to resume Eliquis  from C[facility] standpoint ID - Zosyn  Foley - Spont void.    LV thrombus? PAD HFrEF CAD HLD HTN Remote H/O polio    Beatris Lincoln, MD [facility]   02/06/2024  *Care during the described time interval was provided by me. I have reviewed this patient's available data, including medical history, events of note, physical examination and test  results as part of my evaluation.

## 2024-02-06 NOTE — Progress Notes (Signed)
 PROGRESS NOTE    [name]  ZOX:[id number] DOB: Mar 15, 1946 [name]: 01/26/2024 PCP: Galvin Jules, FNP   Brief Narrative:  78 y.o. male with medical history significant of hypertension, hyperlipidemia, CAD status post stent, chronic systolic CHF presented with worsening abdominal pain.  He was found to have diverticulitis with diverticular abscess and a contained colonic wall perforation.  He was hospitalized for further management.  No improvement noted with conservative management.  Subsequently taken to the OR by general surgery.      Assessment & Plan:  Acute Diverticulitis with contained perforation and abscess CT showed diverticulitis with diverticular abscess and contained perforation with leukocytosis. General surgery and IR consulted.  IR could not find a window to place a drain.   Despite conservative management patient did not have any improvement.  Subsequently taken to the OR on 5/19.  Underwent exploratory laparotomy and sigmoidectomy.   Was found to have sigmoid mass.  See below Has a colostomy.  He has drain tube as well. General surgery continues to follow.  Remains on Zosyn .  Plan to continue antibiotics for 5 days postoperatively.  Patient will complete 5 days of antibiotics today.   Patient was advanced to regular diet yesterday by general surgery.  Seems to be tolerating it well.  Has had bowel movement through the ostomy.  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  Pathology is also positive for adenocarcinoma.  Pathology report suggested cancer was seen in the surgical margins as well.  CT of the abdomen pelvis did not show any enlarged lymph nodes.  Moderately enlarged prostate gland was noted. Patient underwent CT chest which showed Tiny bilateral noncalcified pulmonary nodules in the upper lobe.  These could represent past inflammation but in the setting of newly diagnosed colon cancer metastasis cannot be ruled out. Of note patient did  undergo CT angiogram chest abdomen pelvis on 5/13 which showed multiple pulmonary nodules. CEA noted to be 3.3. Patient has been made an appointment with Dr. Scherrie Curt on June 9 at 1:40 PM.  Chronic systolic CHF/LV thrombus Echocardiogram showed LVEF of 25 to 30%. Patient was seen by cardiology.  There was concern for LV thrombus.  Patient was initially treated with IV heparin .  Now transition to apixaban. Cardiology to schedule outpatient follow-up. Patient was started on ARB and metoprolol .  Experienced hypotension in 70s and medications were held.  Started back on metoprolol  yesterday which she seems to be tolerating well.  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  He was on aspirin  and statin for his CAD. Will need to follow-up with vascular surgery outpatient  Now that he is on full dose anticoagulation he need not be on aspirin  going forward.  Essential hypertension Continue home losartan    Hyperlipidemia Continue home rosuvastatin    CAD Status post stents Continue home ASA, rosuvastatin   Normocytic anemia No overt bleeding noted.  Hemoglobin is stable.   DVT prophylaxis: Apixaban Code Status: full Family Communication:none Disposition Plan: Discharge home when cleared by general surgery.  Anticipate that his drain will come out prior to discharge.   Consultants:  surgery  Procedures: Exploratory laparotomy, colon resection Antimicrobials: zosyn   Subjective: Patient feels well.  Denies any abdominal pain.  Asking about the next steps.  He was told that he has been made an appointment with oncology next month.  Further management to be discussed at that time.  Objective: Vitals:   02/05/24  1343 02/05/24 2038 02/06/24 0502 02/06/24 0806  BP: 135/83 128/72 131/68 120/66  Pulse: 67 72 87 88  Resp:   16 18  Temp: 97.9 F (36.6 C) 98.4 F (36.9 C) 97.7 F (36.5 C) 97.9 F (36.6 C)   TempSrc: Oral Oral Oral Oral  SpO2: 96% 95% 93% 94%  Weight:      Height:        Intake/Output Summary (Last 24 hours) at 02/06/2024 1033 Last data filed at 02/06/2024 0807 Gross per 24 hour  Intake --  Output 1520 ml  Net -1520 ml    Filed Weights   01/26/24 1245 02/01/24 1143  Weight: 67.1 kg 67.1 kg    Examination:  General appearance: Awake alert.  In no distress Resp: Clear to auscultation bilaterally.  Normal effort Cardio: S1-S2 is normal regular.  No S3-S4.  No rubs murmurs or bruit GI: Abdomen is soft.  Ostomy is noted.  Drain tube is noted.  Stool is noted in the ostomy bag. No obvious focal neurological deficits.  Data Reviewed: I have personally reviewed following labs and imaging studies  CBC: Recent Labs  Lab 02/02/24 0744 02/03/24 0700 02/04/24 0450 02/05/24 1154 02/06/24 0437  WBC 11.4* 10.9* 7.4 7.3 9.6  HGB 11.4* 11.2* 10.9* 12.1* 12.9*  HCT 34.2* 32.9* 32.7* 36.7* 38.9*  MCV 85.3 85.2 86.3 86.8 86.3  PLT 307 332 338 376 403*   Basic Metabolic Panel: Recent Labs  Lab 01/31/24 1025 02/01/24 0727 02/02/24 0744 02/03/24 0700 02/04/24 0450 02/05/24 1154  NA 133* 131* 133* 133* 136 136  K 4.5 4.1 4.3 4.2 4.2 4.1  CL 99 101 101 99 102 102  CO2 26 21* 22 24 24 26   GLUCOSE 101* 88 127* 107* 90 104*  BUN 7* 12 17 17 15 9   CREATININE 0.79 0.77 0.77 0.79 0.79 0.82  CALCIUM  8.4* 8.5* 8.2* 8.2* 8.4* 8.7*  MG 2.1  --   --   --   --   --    GFR: Estimated Creatinine Clearance: 68.3 mL/min (by C-G formula based on SCr of 0.82 mg/dL). Liver Function Tests: Recent Labs  Lab 02/03/24 0700  AST 23  ALT 16  ALKPHOS 47  BILITOT 0.5  PROT 5.5*  ALBUMIN 2.3*    Recent Results (from the past 240 hours)  Surgical pcr screen     Status: Abnormal   Collection Time: 02/01/24 11:48 AM   Specimen: Nasal Mucosa; Nasal Swab  Result Value Ref Range Status   MRSA, PCR NEGATIVE NEGATIVE Final   Staphylococcus aureus POSITIVE (A) NEGATIVE Final     Comment: (NOTE) The Xpert SA Assay (FDA approved for NASAL specimens in patients 87 years of age and older), is one component of a comprehensive surveillance program. It is not intended to diagnose infection nor to guide or monitor treatment. Performed at [facility], [address] [place], [place] [address]       Radiology Studies: No results found.   Scheduled Meds:  acetaminophen   1,000 mg Oral Q6H   apixaban  5 mg Oral BID   feeding supplement  1 Container Oral TID BM   metoprolol  tartrate  12.5 mg Oral BID   mupirocin  ointment  1 Application Nasal BID   polyethylene glycol  17 g Oral Daily   rosuvastatin   20 mg Oral Daily   sodium chloride  flush  3 mL Intravenous Q12H   Continuous Infusions:  piperacillin -tazobactam (ZOSYN )  IV 3.375 g (02/06/24 0510)  LOS: 11 days     Maylene Spear, MD  02/06/2024, 10:33 AM

## 2024-02-07 DIAGNOSIS — I513 Intracardiac thrombosis, not elsewhere classified: Secondary | ICD-10-CM | POA: Diagnosis not present

## 2024-02-07 DIAGNOSIS — C189 Malignant neoplasm of colon, unspecified: Secondary | ICD-10-CM | POA: Diagnosis not present

## 2024-02-07 DIAGNOSIS — K5792 Diverticulitis of intestine, part unspecified, without perforation or abscess without bleeding: Secondary | ICD-10-CM | POA: Diagnosis not present

## 2024-02-07 DIAGNOSIS — I5020 Unspecified systolic (congestive) heart failure: Secondary | ICD-10-CM | POA: Diagnosis not present

## 2024-02-07 LAB — BASIC METABOLIC PANEL WITH GFR
Anion gap: 7 (ref 5–15)
BUN: 10 mg/dL (ref 8–23)
CO2: 23 mmol/L (ref 22–32)
Calcium: 8.8 mg/dL — ABNORMAL LOW (ref 8.9–10.3)
Chloride: 107 mmol/L (ref 98–111)
Creatinine, Ser: 0.84 mg/dL (ref 0.61–1.24)
GFR, Estimated: >60 mL/min (ref >60)
Glucose, Bld: 105 mg/dL — ABNORMAL HIGH (ref 70–99)
Potassium: 4 mmol/L (ref 3.5–5.1)
Sodium: 137 mmol/L (ref 135–145)

## 2024-02-07 LAB — MAGNESIUM: Magnesium: 2.2 mg/dL (ref 1.7–2.4)

## 2024-02-07 LAB — CBC
HCT: 36.8 % — ABNORMAL LOW (ref 39.0–52.0)
Hemoglobin: 12.1 g/dL — ABNORMAL LOW (ref 13.0–17.0)
MCH: 28.2 pg (ref 26.0–34.0)
MCHC: 32.9 g/dL (ref 30.0–36.0)
MCV: 85.8 fL (ref 80.0–100.0)
Platelets: 384 10*3/uL (ref 150–400)
RBC: 4.29 MIL/uL (ref 4.22–5.81)
RDW: 13.8 % (ref 11.5–15.5)
WBC: 8.2 10*3/uL (ref 4.0–10.5)
nRBC: 0 % (ref 0.0–0.2)

## 2024-02-07 NOTE — Plan of Care (Signed)
  Problem: Activity: Goal: Risk for activity intolerance will decrease Outcome: Progressing   Problem: Elimination: Goal: Will not experience complications related to bowel motility Outcome: Progressing   Problem: Safety: Goal: Ability to remain free from injury will improve Outcome: Progressing   

## 2024-02-07 NOTE — Progress Notes (Signed)
 General Surgery Follow Up Note  Subjective:    Overnight Issues:  NAEO. Pain currently controlled.  Having flatus and BMs now via ostomy.  Tolerating soft diet. Denies nausea, vomiting, or belching, has been walking the halls and doing the stairs. Lives alone. Working with WOC but has trouble seeing his stoma - may be using mirror  Objective:  Vital signs for last 24 hours: Temp:  [97.3 F (36.3 C)-97.9 F (36.6 C)] 97.9 F (36.6 C) (05/25 0529) Pulse Rate:  [71-87] 87 (05/25 0529) Resp:  [16-18] 16 (05/25 0529) BP: (118-126)/(70-81) 126/81 (05/25 0529) SpO2:  [93 %-95 %] 94 % (05/25 0529)  Hemodynamic parameters for last 24 hours:    Intake/Output from previous day: 05/24 0701 - 05/25 0700 In: -  Out: 600 [Urine:600]  Intake/Output this shift: No intake/output data recorded.  Vent settings for last 24 hours:    Physical Exam:  Gen: comfortable, no distress Neuro: follows commands, alert, communicative HEENT: PERRL Neck: supple CV: RRR Pulm: unlabored breathing on RA Abd: soft, NT, incision clean, dry, intact, JP serous GU: urine clear and yellow, +spontaneous void Extr: wwp, no edema  Results for orders placed or performed during the hospital encounter of 01/26/24 (from the past 24 hours)  CBC     Status: Abnormal   Collection Time: 02/07/24  5:45 AM  Result Value Ref Range   WBC 8.2 4.0 - 10.5 K/uL   RBC 4.29 4.22 - 5.81 MIL/uL   Hemoglobin 12.1 (L) 13.0 - 17.0 g/dL   HCT 28.4 (L) 13.2 - 44.0 %   MCV 85.8 80.0 - 100.0 fL   MCH 28.2 26.0 - 34.0 pg   MCHC 32.9 30.0 - 36.0 g/dL   RDW 10.2 72.5 - 36.6 %   Platelets 384 150 - 400 K/uL   nRBC 0.0 0.0 - 0.2 %  Basic metabolic panel with GFR     Status: Abnormal   Collection Time: 02/07/24  5:45 AM  Result Value Ref Range   Sodium 137 135 - 145 mmol/L   Potassium 4.0 3.5 - 5.1 mmol/L   Chloride 107 98 - 111 mmol/L   CO2 23 22 - 32 mmol/L   Glucose, Bld 105 (H) 70 - 99 mg/dL   BUN 10 8 - 23 mg/dL    Creatinine, Ser 4.40 0.61 - 1.24 mg/dL   Calcium  8.8 (L) 8.9 - 10.3 mg/dL   GFR, Estimated >34 >74 mL/min   Anion gap 7 5 - 15  Magnesium     Status: None   Collection Time: 02/07/24  5:45 AM  Result Value Ref Range   Magnesium 2.2 1.7 - 2.4 mg/dL     Assessment & Plan:  Present on Admission:  Primary hypertension  Mixed hyperlipidemia  HFrEF (heart failure with reduced ejection fraction) (HCC)  CAD (coronary artery disease)  Acute diverticulitis    LOS: 12 days   Additional comments:I reviewed the patient's new clinical lab test results.   and I reviewed the patients new imaging test results.    POD6 s/p open sigmoidectomy by Dr. 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 Scherrie Curt, has been notified and pt has outpatient appointment - Cont abx, plan 5d post op - D/C JP drain - Cont soft diet today. Cont shakes.  - WOCN following for new  ostomy - Mobilize, PT - no PT f/u recommended - Pulm toilet   FEN - soft, shakes, IVF per primary  VTE - SCDs, heparin  gtt; hgb stable, back on Eliquis. Ostomy output brown. ID - no abx at present Foley - Spont void.   Dispo: Awaiting final teaching from [facility] team, if comfortable with this after, ok for discharge from surgical perspective   LV thrombus? PAD HFrEF CAD HLD HTN Remote H/O polio    Beatris Lincoln, MD [facility], [facility]   02/07/2024  *Care during the described time interval was provided by me. I have reviewed this patient's available data, including medical history, events of note, physical examination and test results as part of my evaluation.

## 2024-02-07 NOTE — Progress Notes (Signed)
 PROGRESS NOTE    [name] [name].  WGN:[id number] DOB: December 17, 1945 [name]: 01/26/2024 PCP: Galvin Jules, FNP   Brief Narrative:  78 y.o. male with medical history significant of hypertension, hyperlipidemia, CAD status post stent, chronic systolic CHF presented with worsening abdominal pain.  He was found to have diverticulitis with diverticular abscess and a contained colonic wall perforation.  He was hospitalized for further management.  No improvement noted with conservative management.  Subsequently taken to the OR by general surgery.      Assessment & Plan:  Acute Diverticulitis with contained perforation and abscess CT showed diverticulitis with diverticular abscess and contained perforation with leukocytosis. General surgery and IR consulted.  IR could not find a window to place a drain.   Despite conservative management patient did not have any improvement.  Subsequently taken to the OR on 5/19.  Underwent exploratory laparotomy and sigmoidectomy.   Was found to have sigmoid mass.  See below Has a colostomy.  JP drain to be removed today per general surgery. General surgery continues to follow.  Patient has completed 5 days of Zosyn . Tolerating his diet.  Pain is well-controlled.  Has had bowel movements through his ostomy. Waiting on final teaching from ostomy nurse.  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  Pathology is also positive for adenocarcinoma.  Pathology report suggested cancer was seen in the surgical margins as well.  CT of the abdomen pelvis did not show any enlarged lymph nodes.  Moderately enlarged prostate gland was noted. Patient underwent CT chest which showed Tiny bilateral noncalcified pulmonary nodules in the upper lobe.  These could represent past inflammation but in the setting of newly diagnosed colon cancer metastasis cannot be ruled out. Of note patient did undergo CT angiogram chest abdomen pelvis on 5/13 which showed multiple  pulmonary nodules. CEA noted to be 3.3. Patient has been made an appointment with Dr. Scherrie Curt on June 9 at 1:40 PM.  Chronic systolic CHF/LV thrombus Echocardiogram showed LVEF of 25 to 30%. Patient was seen by cardiology.  There was concern for LV thrombus.  Patient was initially treated with IV heparin .  Now transition to apixaban. Cardiology to schedule outpatient follow-up. Patient was started on ARB and metoprolol .  Experienced hypotension in 70s and medications were held.   Patient was started back on metoprolol  with stable blood pressures.  Would recommend continuing to hold ARB.  Further titration can be done in the outpatient setting.  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  He was on aspirin  and statin for his CAD. Will need to follow-up with vascular surgery outpatient  Now that he is on full dose anticoagulation he need not be on aspirin  going forward.  Essential hypertension Continue home losartan    Hyperlipidemia Continue home rosuvastatin    CAD Status post stents Continue home rosuvastatin  Now on anticoagulation so we can discontinue aspirin .  Normocytic anemia No overt bleeding noted.  Hemoglobin is stable.   DVT prophylaxis: Apixaban Code Status: full Family Communication:none Disposition Plan: Discharge home once he has completed ostomy education.   Consultants:  surgery  Procedures: Exploratory laparotomy, colon resection Antimicrobials: zosyn   Subjective: Patient feels well.  No complaints offered.  Pain is well-controlled.  Tolerating his diet.  Has been urinating without difficulty.  Has been ambulating.  Objective: Vitals:   02/06/24 1343 02/06/24 1959 02/07/24 0529 02/07/24 0854  BP: 118/70 125/81 126/81 122/70  Pulse: 79  71 87 93  Resp: 18 16 16 17   Temp: 97.7 F (36.5 C) (!) 97.3 F (36.3 C) 97.9 F (36.6 C) 97.7 F (36.5 C)  TempSrc: Oral   Oral  SpO2: 93%  95% 94% 94%  Weight:      Height:        Intake/Output Summary (Last 24 hours) at 02/07/2024 0926 Last data filed at 02/06/2024 0954 Gross per 24 hour  Intake --  Output 300 ml  Net -300 ml    Filed Weights   01/26/24 1245 02/01/24 1143  Weight: 67.1 kg 67.1 kg    Examination:  General appearance: Awake alert.  In no distress Resp: Clear to auscultation bilaterally.  Normal effort Cardio: S1-S2 is normal regular.  No S3-S4.  No rubs murmurs or bruit GI: Abdomen is soft.  Ostomy is noted.  Data Reviewed: I have personally reviewed following labs and imaging studies  CBC: Recent Labs  Lab 02/03/24 0700 02/04/24 0450 02/05/24 1154 02/06/24 0437 02/07/24 0545  WBC 10.9* 7.4 7.3 9.6 8.2  HGB 11.2* 10.9* 12.1* 12.9* 12.1*  HCT 32.9* 32.7* 36.7* 38.9* 36.8*  MCV 85.2 86.3 86.8 86.3 85.8  PLT 332 338 376 403* 384   Basic Metabolic Panel: Recent Labs  Lab 01/31/24 1025 02/01/24 0727 02/02/24 0744 02/03/24 0700 02/04/24 0450 02/05/24 1154 02/07/24 0545  NA 133*   < > 133* 133* 136 136 137  K 4.5   < > 4.3 4.2 4.2 4.1 4.0  CL 99   < > 101 99 102 102 107  CO2 26   < > 22 24 24 26 23   GLUCOSE 101*   < > 127* 107* 90 104* 105*  BUN 7*   < > 17 17 15 9 10   CREATININE 0.79   < > 0.77 0.79 0.79 0.82 0.84  CALCIUM  8.4*   < > 8.2* 8.2* 8.4* 8.7* 8.8*  MG 2.1  --   --   --   --   --  2.2   < > = values in this interval not displayed.   GFR: Estimated Creatinine Clearance: 66.6 mL/min (by C-G formula based on SCr of 0.84 mg/dL). Liver Function Tests: Recent Labs  Lab 02/03/24 0700  AST 23  ALT 16  ALKPHOS 47  BILITOT 0.5  PROT 5.5*  ALBUMIN 2.3*    Recent Results (from the past 240 hours)  Surgical pcr screen     Status: Abnormal   Collection Time: 02/01/24 11:48 AM   Specimen: Nasal Mucosa; Nasal Swab  Result Value Ref Range Status   MRSA, PCR NEGATIVE NEGATIVE Final   Staphylococcus aureus POSITIVE (A) NEGATIVE Final    Comment: (NOTE) The Xpert SA Assay  (FDA approved for NASAL specimens in patients 41 years of age and older), is one component of a comprehensive surveillance program. It is not intended to diagnose infection nor to guide or monitor treatment. Performed at [facility], [address] [place], [place] [address]       Radiology Studies: No results found.   Scheduled Meds:  acetaminophen   1,000 mg Oral Q6H   apixaban  5 mg Oral BID   feeding supplement  1 Container Oral TID BM   metoprolol  tartrate  12.5 mg Oral BID   polyethylene glycol  17 g Oral Daily   rosuvastatin   20 mg Oral Daily   sodium chloride  flush  3 mL Intravenous Q12H   Continuous Infusions:     LOS: 12 days  Maylene Spear, MD  02/07/2024, 9:26 AM

## 2024-02-08 DIAGNOSIS — I513 Intracardiac thrombosis, not elsewhere classified: Secondary | ICD-10-CM | POA: Diagnosis not present

## 2024-02-08 DIAGNOSIS — C189 Malignant neoplasm of colon, unspecified: Secondary | ICD-10-CM | POA: Diagnosis not present

## 2024-02-08 DIAGNOSIS — I5020 Unspecified systolic (congestive) heart failure: Secondary | ICD-10-CM | POA: Diagnosis not present

## 2024-02-08 DIAGNOSIS — K5792 Diverticulitis of intestine, part unspecified, without perforation or abscess without bleeding: Secondary | ICD-10-CM | POA: Diagnosis not present

## 2024-02-08 NOTE — Consult Note (Addendum)
 WOC Nurse ostomy follow up Stoma type/location: LLQ colostomy Stomal assessment/size: 40 mm x 35 mm Peristomal assessment: skin intact, midline staple line. One staple line is leaking minimum serosanguinous drainage. Applied gauze and tape to protect the area.  Treatment options for stomal/peristomal skin: Ring 2" [phone] Output the bag was already cleaned. No output. Ostomy pouching: 2pc. Bag #2 and convex barrier [phone] Education provided: Provide instruction to the patient. According to him, he will take care of his ostomy with no help. But, he has the interesting to home health and do a consult in the ostomy clinic after dc. - Clean the ostomy and periostomy skin with water and soup if needed. - Cut the barrier as ostomy size and shape. The plastic protection can be a model for the next cutting; - Apply on his skin stretching a little, match with the bottom first, making sure that the barrier is protecting the skin surrounding; - Close the lock in roll system. - Change the whole system twice a week.  The patient participated with hand on. He has difficult to cut because the scissor is to narrow for his fingers.  He understand the instructions and feels more confident. Gave folders about ostomy clinic here in [facility], folder for the company who sell the supplies, no compliance.   Enrolled patient in [employer] Secure Start Discharge program: Yes  WOC team will follow weekly.   Please reconsult if further assistance is needed. Thank-you,  Rachel Budds BSN, RN, [employer], WOC  (Pager: [phone])

## 2024-02-08 NOTE — Plan of Care (Signed)
  Problem: Activity: Goal: Risk for activity intolerance will decrease Outcome: Progressing   Problem: Nutrition: Goal: Adequate nutrition will be maintained Outcome: Progressing   Problem: Elimination: Goal: Will not experience complications related to bowel motility Outcome: Progressing Goal: Will not experience complications related to urinary retention Outcome: Progressing   Problem: Safety: Goal: Ability to remain free from injury will improve Outcome: Progressing

## 2024-02-08 NOTE — Progress Notes (Signed)
 PROGRESS NOTE    [name] [name].  ZOX:[id number] DOB: Dec 30, 1945 [name]: 01/26/2024 PCP: Galvin Jules, FNP   Brief Narrative:  78 y.o. male with medical history significant of hypertension, hyperlipidemia, CAD status post stent, chronic systolic CHF presented with worsening abdominal pain.  He was found to have diverticulitis with diverticular abscess and a contained colonic wall perforation.  He was hospitalized for further management.  No improvement noted with conservative management.  Subsequently taken to the OR by general surgery.      Assessment & Plan:  Acute Diverticulitis with contained perforation and abscess CT showed diverticulitis with diverticular abscess and contained perforation with leukocytosis. General surgery and IR consulted.  IR could not find a window to place a drain.   Despite conservative management patient did not have any improvement.  Subsequently taken to the OR on 5/19.  Underwent exploratory laparotomy and sigmoidectomy.   Was found to have sigmoid mass.  See below Has a colostomy.  JP drain was removed on 5/25. General surgery continues to follow.  Patient has completed 5 days of Zosyn . Tolerating his diet.  Pain is well-controlled.  Has had bowel movements through his ostomy. Waiting on final teaching from ostomy nurse. Did have an episode of vomiting yesterday which he blamed on the burger that he tried to have.  Denies any worsening abdominal pain this morning.  No nausea this morning.  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  Pathology is also positive for adenocarcinoma.  Pathology report suggested cancer was seen in the surgical margins as well.  CT of the abdomen pelvis did not show any enlarged lymph nodes.  Moderately enlarged prostate gland was noted. Patient underwent CT chest which showed Tiny bilateral noncalcified pulmonary nodules in the upper lobe.  These could represent past inflammation but in the setting of newly  diagnosed colon cancer metastasis cannot be ruled out. Of note patient did undergo CT angiogram chest abdomen pelvis on 5/13 which showed multiple pulmonary nodules. CEA noted to be 3.3. Patient has been made an appointment with Dr. Scherrie Curt on June 9 at 1:40 PM.  Chronic systolic CHF/LV thrombus Echocardiogram showed LVEF of 25 to 30%. Patient was seen by cardiology.  There was concern for LV thrombus.  Patient was initially treated with IV heparin .  Now transitioned to apixaban. Cardiology to schedule outpatient follow-up. Patient was started on ARB and metoprolol .  Experienced hypotension in 70s and medications were held.   Patient was started back on metoprolol  with stable blood pressures.  Would recommend continuing to hold ARB.  Further titration can be done in the outpatient setting.  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  He was on aspirin  and statin for his CAD. Will need to follow-up with vascular surgery outpatient  Now that he is on full dose anticoagulation he need not be on aspirin  going forward.  Essential hypertension Blood pressure is reasonably well-controlled on metoprolol .  Losartan  on hold.   Hyperlipidemia Continue home rosuvastatin    CAD Status post stents Continue home rosuvastatin  Now on anticoagulation so we can discontinue aspirin .  Normocytic anemia No overt bleeding noted.  Hemoglobin is stable.   DVT prophylaxis: Apixaban Code Status: full Family Communication:none Disposition Plan: Anticipate discharge tomorrow.   Consultants:  surgery  Procedures: Exploratory laparotomy, colon resection Antimicrobials: zosyn   Subjective: Patient feels well.  Mentions an episode of vomiting that occurred after he tried to have  a burger yesterday evening.  Denies any abdominal pain or nausea or vomiting this morning though.  Objective: Vitals:   02/07/24 2038 02/07/24 2149  02/08/24 0444 02/08/24 0748  BP: 138/70 120/76 123/76 102/66  Pulse: 86 90 84 84  Resp: 16  16 17   Temp: (!) 97.3 F (36.3 C)  98.1 F (36.7 C) 97.6 F (36.4 C)  TempSrc:   Oral Oral  SpO2: 96%  95% 96%  Weight:      Height:       No intake or output data in the 24 hours ending 02/08/24 1001   Filed Weights   01/26/24 1245 02/01/24 1143  Weight: 67.1 kg 67.1 kg    Examination:  General appearance: Awake alert.  In no distress Resp: Clear to auscultation bilaterally.  Normal effort Cardio: S1-S2 is normal regular.  No S3-S4.  No rubs murmurs or bruit GI: Abdomen is soft.  He is noted.  JP drain has been pulled out.  Data Reviewed:   CBC: Recent Labs  Lab 02/03/24 0700 02/04/24 0450 02/05/24 1154 02/06/24 0437 02/07/24 0545  WBC 10.9* 7.4 7.3 9.6 8.2  HGB 11.2* 10.9* 12.1* 12.9* 12.1*  HCT 32.9* 32.7* 36.7* 38.9* 36.8*  MCV 85.2 86.3 86.8 86.3 85.8  PLT 332 338 376 403* 384   Basic Metabolic Panel: Recent Labs  Lab 02/02/24 0744 02/03/24 0700 02/04/24 0450 02/05/24 1154 02/07/24 0545  NA 133* 133* 136 136 137  K 4.3 4.2 4.2 4.1 4.0  CL 101 99 102 102 107  CO2 22 24 24 26 23   GLUCOSE 127* 107* 90 104* 105*  BUN 17 17 15 9 10   CREATININE 0.77 0.79 0.79 0.82 0.84  CALCIUM  8.2* 8.2* 8.4* 8.7* 8.8*  MG  --   --   --   --  2.2   GFR: Estimated Creatinine Clearance: 66.6 mL/min (by C-G formula based on SCr of 0.84 mg/dL). Liver Function Tests: Recent Labs  Lab 02/03/24 0700  AST 23  ALT 16  ALKPHOS 47  BILITOT 0.5  PROT 5.5*  ALBUMIN 2.3*    Recent Results (from the past 240 hours)  Surgical pcr screen     Status: Abnormal   Collection Time: 02/01/24 11:48 AM   Specimen: Nasal Mucosa; Nasal Swab  Result Value Ref Range Status   MRSA, PCR NEGATIVE NEGATIVE Final   Staphylococcus aureus POSITIVE (A) NEGATIVE Final    Comment: (NOTE) The Xpert SA Assay (FDA approved for NASAL specimens in patients 37 years of age and older), is one component of  a comprehensive surveillance program. It is not intended to diagnose infection nor to guide or monitor treatment. Performed at [facility], [address], [place], [place] [address]       Radiology Studies: No results found.   Scheduled Meds:  acetaminophen   1,000 mg Oral Q6H   apixaban  5 mg Oral BID   feeding supplement  1 Container Oral TID BM   metoprolol  tartrate  12.5 mg Oral BID   polyethylene glycol  17 g Oral Daily   rosuvastatin   20 mg Oral Daily   sodium chloride  flush  3 mL Intravenous Q12H   Continuous Infusions:     LOS: 13 days     Maylene Spear, MD  02/08/2024, 10:01 AM

## 2024-02-08 NOTE — Progress Notes (Addendum)
   General Surgery Follow Up Note  Subjective:    Overnight Issues:  NAEO. Pain currently controlled.  Having flatus and BMs now via ostomy.  Tolerating soft diet. Denies nausea, vomiting, or belching, has been walking the halls and doing the stairs. Lives alone. Working with WOC but has trouble seeing his stoma - may be using mirror  Objective:  Vital signs for last 24 hours: Temp:  [97.3 F (36.3 C)-98.1 F (36.7 C)] 97.6 F (36.4 C) (05/26 0748) Pulse Rate:  [78-90] 84 (05/26 0748) Resp:  [16-19] 17 (05/26 0748) BP: (92-138)/(66-78) 102/66 (05/26 0748) SpO2:  [95 %-96 %] 96 % (05/26 0748)  Hemodynamic parameters for last 24 hours:    Intake/Output from previous day: No intake/output data recorded.  Intake/Output this shift: No intake/output data recorded.  Vent settings for last 24 hours:    Physical Exam:  Gen: comfortable, no distress Neuro: follows commands, alert, communicative HEENT: PERRL Neck: supple CV: RRR Pulm: unlabored breathing on RA Abd: soft, NT, incision clean, dry, intact, JP serous GU: urine clear and yellow, +spontaneous void Extr: wwp, no edema  No results found for this or any previous visit (from the past 24 hours).    Assessment & Plan:  Present on Admission:  Primary hypertension  Mixed hyperlipidemia  HFrEF (heart failure with reduced ejection fraction) (HCC)  CAD (coronary artery disease)  Acute diverticulitis    LOS: 13 days   Additional comments:I reviewed the patient's new clinical lab test results.   and I reviewed the patients new imaging test results.    POD7 s/p open sigmoidectomy by Dr. 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 Scherrie Curt, has been notified and pt has outpatient appointment - Cont abx, plan 5d post op -  JP out prior to d/c - Cont soft diet today. Cont shakes.  - WOCN following for new ostomy - saw today - Mobilize, PT - no PT f/u recommended - Pulm toilet   FEN - soft, shakes, IVF per primary  VTE - SCDs, heparin  gtt; hgb stable, back on Eliquis. Ostomy output brown. ID - no abx at present Foley - Spont void.   Dispo: Awaiting final teaching from [facility] team, if comfortable with this after, ok for discharge from surgical perspective   LV thrombus? PAD HFrEF CAD HLD HTN Remote H/O polio    [name] Lincoln, MD [facility], [facility]   02/08/2024  *Care during the described time interval was provided by me. I have reviewed this patient's available data, including medical history, events of note, physical examination and test results as part of my evaluation.

## 2024-02-08 NOTE — Progress Notes (Signed)
 Physical Therapy Treatment Patient Details Name: [name] [name]. MRN: [id number] DOB: [birth year] Today's Date: 02/08/2024   History of Present Illness Pt is a 78 y.o. male presenting to [facility] ED on 01/26/24 with abdominal pain for 5 days; CT shows diverticulitis with diverticular abscess and contained perforation with leukocytosis. PMH is significant for HTN, HLD, CAD, and CHF.    PT Comments  Pt tolerates treatment well, ambulating for household distances with support of a walker. Pt declines further stair training at this time, expressing confidence in his ability to negotiate his steps at home. PT encourages frequent mobilization in an effort to improve activity tolerance and strength. Pt reports he has no ambulated 4 times today.    If plan is discharge home, recommend the following: Assist for transportation   Can travel by private vehicle        Equipment Recommendations  None recommended by PT    Recommendations for Other Services       Precautions / Restrictions Precautions Precautions: None Recall of Precautions/Restrictions: Intact Restrictions Weight Bearing Restrictions Per Provider Order: No     Mobility  Bed Mobility Overal bed mobility: Modified Independent                  Transfers Overall transfer level: Modified independent Equipment used: Rolling walker (2 wheels)                    Ambulation/Gait Ambulation/Gait assistance: Modified independent (Device/Increase time) Gait Distance (Feet): 150 Feet Assistive device: Rolling walker (2 wheels) Gait Pattern/deviations: Step-through pattern Gait velocity: functional Gait velocity interpretation: 1.31 - 2.62 ft/sec, indicative of limited community ambulator   General Gait Details: steady step-through gait with support of RW, pt ambulates 25' at end of session without DME to go to bathroom   Stairs Stairs:  (pt declines stair training, reports confidence in his ability to negotiate his  steps at home)           Wheelchair Mobility     Tilt Bed    Modified Rankin (Stroke Patients Only)       Balance Overall balance assessment: Needs assistance Sitting-balance support: No upper extremity supported, Feet supported Sitting balance-Leahy Scale: Good     Standing balance support: No upper extremity supported Standing balance-Leahy Scale: Fair                              Hotel manager: No apparent difficulties  Cognition Arousal: Alert Behavior During Therapy: WFL for tasks assessed/performed   PT - Cognitive impairments: No apparent impairments                         Following commands: Intact      Cueing Cueing Techniques: Verbal cues  Exercises      General Comments General comments (skin integrity, edema, etc.): VSS on RA      Pertinent Vitals/Pain Pain Assessment Pain Assessment: Faces Faces Pain Scale: Hurts little more Pain Location: abdomen with coughing Pain Descriptors / Indicators: Sore Pain Intervention(s): Monitored during session    Home Living                          Prior Function            PT Goals (current goals can now be found in the care plan section) Acute Rehab PT Goals Patient  Stated Goal: to be able to get back to working Progress towards PT goals: Progressing toward goals    Frequency    Min 2X/week      PT Plan      Co-evaluation              AM-PAC PT "6 Clicks" Mobility   Outcome Measure  Help needed turning from your back to your side while in a flat bed without using bedrails?: None Help needed moving from lying on your back to sitting on the side of a flat bed without using bedrails?: None Help needed moving to and from a bed to a chair (including a wheelchair)?: None Help needed standing up from a chair using your arms (e.g., wheelchair or bedside chair)?: None Help needed to walk in hospital room?: None Help needed  climbing 3-5 steps with a railing? : A Little 6 Click Score: 23    End of Session Equipment Utilized During Treatment: Gait belt Activity Tolerance: Patient tolerated treatment well Patient left: in bed;with call bell/phone within reach Nurse Communication: Mobility status PT Visit Diagnosis: Unsteadiness on feet (R26.81);Muscle weakness (generalized) (M62.81)     Time: 1610-9604 PT Time Calculation (min) (ACUTE ONLY): 15 min  Charges:    $Gait Training: 8-22 mins PT General Charges $$ ACUTE PT VISIT: 1 Visit                     Rexie Catena, PT, DPT Acute Rehabilitation Office [phone]    Rexie Catena 02/08/2024, 3:26 PM

## 2024-02-08 NOTE — TOC Progression Note (Signed)
 Transition of Care (TOC) - Progression Note    Patient Details  Name: [name] [name]. MRN: [id number] Date of Birth: 1946/03/01  Transition of Care [facility]) CM/SW Contact  Tom-Johnson, Angelique Ken, RN Phone Number: 02/08/2024, 3:52 PM  Clinical Narrative:     Patient requested to speak with CM about Ostomy care at home. Requesting a nurse to assist with Ostomy dressing changes. Patient states he has no preferences. Order placed and CM called in referral to CM and [facility] voiced acceptance, info on AVS.   CM will continue to follow as patient progresses with care towards discharge.        Expected Discharge Plan and Services                                               Social Determinants of Health (SDOH) Interventions SDOH Screenings   Food Insecurity: No Food Insecurity (01/26/2024)  Housing: Low Risk  (01/26/2024)  Transportation Needs: No Transportation Needs (01/26/2024)  Utilities: Not At Risk (01/26/2024)  Alcohol Screen: Low Risk  (02/13/2023)  Depression (PHQ2-9): Low Risk  (01/08/2024)  Financial Resource Strain: Low Risk  (02/13/2023)  Physical Activity: Insufficiently Active (02/13/2023)  Social Connections: Socially Integrated (01/27/2024)  Stress: No Stress Concern Present (10/22/2022)  Tobacco Use: Medium Risk (02/01/2024)    Readmission Risk Interventions     No data to display

## 2024-02-09 ENCOUNTER — Other Ambulatory Visit (HOSPITAL_COMMUNITY): Payer: Self-pay

## 2024-02-09 DIAGNOSIS — C189 Malignant neoplasm of colon, unspecified: Secondary | ICD-10-CM | POA: Diagnosis not present

## 2024-02-09 LAB — BASIC METABOLIC PANEL WITH GFR
Anion gap: 5 (ref 5–15)
BUN: 24 mg/dL — ABNORMAL HIGH (ref 8–23)
CO2: 23 mmol/L (ref 22–32)
Calcium: 8.5 mg/dL — ABNORMAL LOW (ref 8.9–10.3)
Chloride: 108 mmol/L (ref 98–111)
Creatinine, Ser: 0.84 mg/dL (ref 0.61–1.24)
GFR, Estimated: >60 mL/min (ref >60)
Glucose, Bld: 97 mg/dL (ref 70–99)
Potassium: 4.6 mmol/L (ref 3.5–5.1)
Sodium: 136 mmol/L (ref 135–145)

## 2024-02-09 LAB — CBC
HCT: 38.3 % — ABNORMAL LOW (ref 39.0–52.0)
Hemoglobin: 12.7 g/dL — ABNORMAL LOW (ref 13.0–17.0)
MCH: 28.8 pg (ref 26.0–34.0)
MCHC: 33.2 g/dL (ref 30.0–36.0)
MCV: 86.8 fL (ref 80.0–100.0)
Platelets: 406 10*3/uL — ABNORMAL HIGH (ref 150–400)
RBC: 4.41 MIL/uL (ref 4.22–5.81)
RDW: 14.3 % (ref 11.5–15.5)
WBC: 8.1 10*3/uL (ref 4.0–10.5)
nRBC: 0 % (ref 0.0–0.2)

## 2024-02-09 LAB — MAGNESIUM: Magnesium: 2.2 mg/dL (ref 1.7–2.4)

## 2024-02-09 MED ORDER — METOPROLOL TARTRATE 25 MG PO TABS
12.5000 mg | ORAL_TABLET | Freq: Two times a day (BID) | ORAL | 0 refills | Status: DC
  Filled 2024-02-09: qty 30, 30d supply, fill #0

## 2024-02-09 MED ORDER — ACETAMINOPHEN 500 MG PO TABS
500.0000 mg | ORAL_TABLET | Freq: Four times a day (QID) | ORAL | 0 refills | Status: AC
  Filled 2024-02-09: qty 28, 7d supply, fill #0

## 2024-02-09 MED ORDER — POLYETHYLENE GLYCOL 3350 17 GM/SCOOP PO POWD
17.0000 g | Freq: Every day | ORAL | 0 refills | Status: DC
  Filled 2024-02-09: qty 476, 28d supply, fill #0

## 2024-02-09 NOTE — Care Management Important Message (Signed)
 Important Message  Patient Details  Name: [name] [name]. MRN: [id number] Date of Birth: 07/16/1946   Important Message Given:  Yes - Medicare IM     Felix Host 02/09/2024, 3:03 PM

## 2024-02-09 NOTE — Progress Notes (Signed)
 Explained discharge instructions to patient. Reviewed follow up appointment and next medication administration times. Also reviewed education. Patient verbalized having an understanding for instructions given. All belongings are in the patient's possession to include TOC meds. IV was removed by the patient's RN. No other needs verbalized.Patient's son is at the bedside to transport the patient home. Will transport downstairs for discharge.

## 2024-02-09 NOTE — Discharge Instructions (Addendum)
 Information on my medicine - ELIQUIS (apixaban)  This medication education was reviewed with me or my healthcare representative as part of my discharge preparation.   Why was Eliquis prescribed for you? Eliquis was prescribed for you to reduce the risk of a blood clot forming that can cause a stroke if you have an LV thrombus (blood clot).  What do You need to know about Eliquis ? Take your Eliquis TWICE DAILY - one tablet in the morning and one tablet in the evening with or without food. If you have difficulty swallowing the tablet whole please discuss with your pharmacist how to take the medication safely.  Take Eliquis exactly as prescribed by your doctor and DO NOT stop taking Eliquis without talking to the doctor who prescribed the medication.  Stopping may increase your risk of developing a stroke.  Refill your prescription before you run out.  After discharge, you should have regular check-up appointments with your healthcare provider that is prescribing your Eliquis.  In the future your dose may need to be changed if your kidney function or weight changes by a significant amount or as you get older.  What do you do if you miss a dose? If you miss a dose, take it as soon as you remember on the same day and resume taking twice daily.  Do not take more than one dose of ELIQUIS at the same time to make up a missed dose.  Important Safety Information A possible side effect of Eliquis is bleeding. You should call your healthcare provider right away if you experience any of the following: Bleeding from an injury or your nose that does not stop. Unusual colored urine (red or dark brown) or unusual colored stools (red or black). Unusual bruising for unknown reasons. A serious fall or if you hit your head (even if there is no bleeding).  Some medicines may interact with Eliquis and might increase your risk of bleeding or clotting while on Eliquis. To help avoid this, consult your  healthcare provider or pharmacist prior to using any new prescription or non-prescription medications, including herbals, vitamins, non-steroidal anti-inflammatory drugs (NSAIDs) and supplements.  This website has more information on Eliquis (apixaban): http://www.eliquis.com/eliquis/home

## 2024-02-09 NOTE — Plan of Care (Signed)
  Problem: Education: Goal: Knowledge of General Education information will improve Description: Including pain rating scale, medication(s)/side effects and non-pharmacologic comfort measures 02/09/2024 1137 by Jeralene Mom, RN Outcome: Adequate for Discharge 02/09/2024 1136 by Jeralene Mom, RN Outcome: Adequate for Discharge   Problem: Health Behavior/Discharge Planning: Goal: Ability to manage health-related needs will improve 02/09/2024 1137 by Jeralene Mom, RN Outcome: Adequate for Discharge 02/09/2024 1136 by Jeralene Mom, RN Outcome: Adequate for Discharge   Problem: Clinical Measurements: Goal: Ability to maintain clinical measurements within normal limits will improve 02/09/2024 1137 by Jeralene Mom, RN Outcome: Adequate for Discharge 02/09/2024 1136 by Jeralene Mom, RN Outcome: Adequate for Discharge Goal: Will remain free from infection 02/09/2024 1137 by Jeralene Mom, RN Outcome: Adequate for Discharge 02/09/2024 1136 by Jeralene Mom, RN Outcome: Adequate for Discharge Goal: Diagnostic test results will improve 02/09/2024 1137 by Jeralene Mom, RN Outcome: Adequate for Discharge 02/09/2024 1136 by Jeralene Mom, RN Outcome: Adequate for Discharge Goal: Respiratory complications will improve 02/09/2024 1137 by Jeralene Mom, RN Outcome: Adequate for Discharge 02/09/2024 1136 by Jeralene Mom, RN Outcome: Adequate for Discharge Goal: Cardiovascular complication will be avoided 02/09/2024 1137 by Jeralene Mom, RN Outcome: Adequate for Discharge 02/09/2024 1136 by Jeralene Mom, RN Outcome: Adequate for Discharge   Problem: Activity: Goal: Risk for activity intolerance will decrease 02/09/2024 1137 by Jeralene Mom, RN Outcome: Adequate for Discharge 02/09/2024 1136 by Jeralene Mom, RN Outcome: Adequate for Discharge   Problem: Nutrition: Goal: Adequate nutrition will be maintained 02/09/2024 1137 by Jeralene Mom,  RN Outcome: Adequate for Discharge 02/09/2024 1136 by Jeralene Mom, RN Outcome: Adequate for Discharge   Problem: Coping: Goal: Level of anxiety will decrease 02/09/2024 1137 by Jeralene Mom, RN Outcome: Adequate for Discharge 02/09/2024 1136 by Jeralene Mom, RN Outcome: Adequate for Discharge   Problem: Elimination: Goal: Will not experience complications related to bowel motility 02/09/2024 1137 by Jeralene Mom, RN Outcome: Adequate for Discharge 02/09/2024 1136 by Jeralene Mom, RN Outcome: Adequate for Discharge Goal: Will not experience complications related to urinary retention 02/09/2024 1137 by Jeralene Mom, RN Outcome: Adequate for Discharge 02/09/2024 1136 by Jeralene Mom, RN Outcome: Adequate for Discharge   Problem: Pain Managment: Goal: General experience of comfort will improve and/or be controlled 02/09/2024 1137 by Jeralene Mom, RN Outcome: Adequate for Discharge 02/09/2024 1136 by Jeralene Mom, RN Outcome: Adequate for Discharge   Problem: Safety: Goal: Ability to remain free from injury will improve 02/09/2024 1137 by Jeralene Mom, RN Outcome: Adequate for Discharge 02/09/2024 1136 by Jeralene Mom, RN Outcome: Adequate for Discharge   Problem: Skin Integrity: Goal: Risk for impaired skin integrity will decrease 02/09/2024 1137 by Jeralene Mom, RN Outcome: Adequate for Discharge 02/09/2024 1136 by Jeralene Mom, RN Outcome: Adequate for Discharge   Problem: Acute Rehab PT Goals(only PT should resolve) Goal: Patient Will Transfer Sit To/From Stand Outcome: Adequate for Discharge Goal: Pt Will Perform Standing Balance Or Pre-Gait Outcome: Adequate for Discharge Goal: Pt Will Ambulate Outcome: Adequate for Discharge Goal: Pt Will Go Up/Down Stairs Outcome: Adequate for Discharge

## 2024-02-09 NOTE — Discharge Summary (Signed)
 [facility]  Physician Discharge Summary   Patient ID: [name] [name]. MRN: [id number] DOB/AGE: 19-Dec-1945 78 y.o.  Admit date: 01/26/2024 Discharge date:   02/09/2024   PCP: Galvin Jules, FNP  DISCHARGE DIAGNOSES:    Acute diverticulitis with perforation Colon cancer   CAD (coronary artery disease)   Mixed hyperlipidemia   HFrEF (heart failure with reduced ejection fraction) (HCC)   Primary hypertension   RECOMMENDATIONS FOR OUTPATIENT FOLLOW UP: General surgery to arrange outpatient follow-up Patient has appointment for outpatient consultation with medical oncology Cardiology is scheduled outpatient follow-up Needs outpatient follow-up with vascular surgery for incidentally detected iliac artery disease.  Home Health: PT RN Equipment/Devices: None  CODE STATUS: Full code  DISCHARGE CONDITION: fair  Diet recommendation: Regular as tolerated  INITIAL HISTORY: 78 y.o. male with medical history significant of hypertension, hyperlipidemia, CAD status post stent, chronic systolic CHF presented with worsening abdominal pain.  He was found to have diverticulitis with diverticular abscess and a contained colonic wall perforation.  He was hospitalized for further management.  No improvement noted with conservative management.  Subsequently taken to the OR by general surgery.    HOSPITAL COURSE:   Acute Diverticulitis with contained perforation and abscess CT showed diverticulitis with diverticular abscess and contained perforation with leukocytosis. General surgery and IR consulted.  IR could not find a window to place a drain.   Despite conservative management patient did not have any improvement.  Subsequently taken to the OR on 5/19.  Underwent exploratory laparotomy and sigmoidectomy.   Was found to have sigmoid mass.  See below Has a colostomy.  JP drain was removed on 5/25. Patient has completed 5 days of Zosyn . Tolerating his diet.  Pain is  well-controlled.  Has had bowel movements through his ostomy. Stable this morning.  Feels comfortable with his ostomy changes though he is requesting home health nurse to assist which has been ordered. General surgery to schedule outpatient follow-up.   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  Pathology is also positive for adenocarcinoma.  Pathology report suggested cancer was seen in the surgical margins as well.  CT of the abdomen pelvis did not show any enlarged lymph nodes.  Moderately enlarged prostate gland was noted. Patient underwent CT chest which showed Tiny bilateral noncalcified pulmonary nodules in the upper lobe.  These could represent past inflammation but in the setting of newly diagnosed colon cancer metastasis cannot be ruled out. Of note patient did undergo CT angiogram chest abdomen pelvis on 5/13 which showed multiple pulmonary nodules. CEA noted to be 3.3. Patient has been made an appointment with Dr. Scherrie Curt on June 9 at 1:40 PM.   Chronic systolic CHF/LV thrombus Echocardiogram showed LVEF of 25 to 30%. Patient was seen by cardiology.  There was concern for LV thrombus.  Patient was initially treated with IV heparin .  Now transitioned to apixaban . Cardiology to schedule outpatient follow-up. Patient was started on ARB and metoprolol .  Experienced hypotension in 70s and medications were held.   Patient was started back on metoprolol  with stable blood pressures.  Would recommend continuing to hold ARB.  Further titration can be done in the outpatient setting.   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  He was on aspirin  and statin for his CAD. Now that he is on full dose anticoagulation he need not be on aspirin  going forward. He might benefit from vascular surgery  evaluation in the outpatient setting on a nonurgent basis.   Essential hypertension Blood pressure is  reasonably well-controlled on metoprolol .  Losartan  on hold.   Hyperlipidemia Continue home rosuvastatin    CAD Status post stents Continue home rosuvastatin  Now on anticoagulation so we can discontinue aspirin .   Normocytic anemia No overt bleeding noted.  Hemoglobin is stable.   Patient is stable.  Okay for discharge home today.   PERTINENT LABS:  The results of significant diagnostics from this hospitalization (including imaging, microbiology, ancillary and laboratory) are listed below for reference.    Microbiology: Recent Results (from the past 240 hours)  Surgical pcr screen     Status: Abnormal   Collection Time: 02/01/24 11:48 AM   Specimen: Nasal Mucosa; Nasal Swab  Result Value Ref Range Status   MRSA, PCR NEGATIVE NEGATIVE Final   Staphylococcus aureus POSITIVE (A) NEGATIVE Final    Comment: (NOTE) The Xpert SA Assay (FDA approved for NASAL specimens in patients 63 years of age and older), is one component of a comprehensive surveillance program. It is not intended to diagnose infection nor to guide or monitor treatment. Performed at [facility], [address], [place], [place] [address]      Labs:   Basic Metabolic Panel: Recent Labs  Lab 02/03/24 0700 02/04/24 0450 02/05/24 1154 02/07/24 0545 02/09/24 0538  NA 133* 136 136 137 136  K 4.2 4.2 4.1 4.0 4.6  CL 99 102 102 107 108  CO2 24 24 26 23 23   GLUCOSE 107* 90 104* 105* 97  BUN 17 15 9 10  24*  CREATININE 0.79 0.79 0.82 0.84 0.84  CALCIUM  8.2* 8.4* 8.7* 8.8* 8.5*  MG  --   --   --  2.2 2.2   Liver Function Tests: Recent Labs  Lab 02/03/24 0700  AST 23  ALT 16  ALKPHOS 47  BILITOT 0.5  PROT 5.5*  ALBUMIN 2.3*    CBC: Recent Labs  Lab 02/04/24 0450 02/05/24 1154 02/06/24 0437 02/07/24 0545 02/09/24 0538  WBC 7.4 7.3 9.6 8.2 8.1  HGB 10.9* 12.1* 12.9* 12.1* 12.7*  HCT 32.7* 36.7* 38.9* 36.8* 38.3*  MCV 86.3 86.8 86.3 85.8 86.8  PLT 338 376 403* 384 406*       IMAGING STUDIES CT Chest High Resolution Result Date: 02/04/2024 CLINICAL DATA:  Colon cancer staging * Tracking Code: BO * EXAM: CT CHEST WITHOUT CONTRAST TECHNIQUE: Multidetector CT imaging of the chest was performed following the standard protocol without intravenous contrast. High resolution imaging of the lungs, as well as inspiratory and expiratory imaging, was performed. RADIATION DOSE REDUCTION: This exam was performed according to the departmental dose-optimization program which includes automated exposure control, adjustment of the mA and/or kV according to patient size and/or use of iterative reconstruction technique. COMPARISON:  CT chest angiogram, 01/26/2024 FINDINGS: Cardiovascular: Aortic atherosclerosis. Cardiomegaly. Three-vessel coronary artery calcifications. No pericardial effusion. Mediastinum/Nodes: No enlarged mediastinal, hilar, or axillary lymph nodes. Thyroid  gland, trachea, and esophagus demonstrate no significant findings. Lungs/Pleura: Severe emphysema. Diffuse bilateral bronchial wall thickening. Small nodules in the peripheral right upper lobe, a noncalcified nodule measuring 0.3 cm (series 8, image 51), a calcified nodule measuring 0.4 cm (series 8, image 59). Noncalcified nodule of the left upper lobe measuring 0.3 cm (series 8, image 67). Dependent bibasilar scarring or atelectasis. Trace pleural effusions. Upper Abdomen: Free air in the included upper abdomen, in keeping with known sigmoid colon perforation. Musculoskeletal: No chest wall abnormality. No acute osseous findings. IMPRESSION: 1.  Tiny bilateral noncalcified pulmonary nodules in the upper lobes measuring 0.3 cm, nonspecific and statistically most likely sequelae of prior infection or inflammation however small metastases not excluded. Attention on follow-up in the setting of newly diagnosed colon malignancy. 2. Severe emphysema and diffuse bilateral bronchial wall thickening. 3. Trace pleural effusions. 4.  Free air in the included upper abdomen, in keeping with known sigmoid colon perforation. 5. Coronary artery disease. Aortic Atherosclerosis (ICD10-I70.0) and Emphysema (ICD10-J43.9). Electronically Signed   By: Fredricka Jenny M.D.   On: 02/04/2024 10:20   CT ABDOMEN PELVIS W CONTRAST Addendum Date: 01/29/2024 ADDENDUM REPORT: 01/29/2024 17:03 ADDENDUM: The possibility of a perforated sigmoid colon carcinoma needs to be considered. Electronically Signed   By: Catherin Closs M.D.   On: 01/29/2024 17:03   Result Date: 01/29/2024 CLINICAL DATA:  Follow-up perforated sigmoid diverticulitis with abscess. EXAM: CT ABDOMEN AND PELVIS WITH CONTRAST TECHNIQUE: Multidetector CT imaging of the abdomen and pelvis was performed using the standard protocol following bolus administration of intravenous contrast. RADIATION DOSE REDUCTION: This exam was performed according to the departmental dose-optimization program which includes automated exposure control, adjustment of the mA and/or kV according to patient size and/or use of iterative reconstruction technique. CONTRAST:  75mL OMNIPAQUE  IOHEXOL  350 MG/ML SOLN COMPARISON:  01/26/2024 FINDINGS: Lower chest: Normal sized heart.  Minimal left basilar atelectasis. Hepatobiliary: Multiple small liver cysts. Unremarkable gallbladder. Pancreas: Unremarkable. No pancreatic ductal dilatation or surrounding inflammatory changes. Spleen: Normal in size without focal abnormality. Adrenals/Urinary Tract: Normal-appearing adrenal glands. Small bilateral simple appearing renal cysts. These do not need imaging follow-up. Unremarkable ureters and urinary bladder. Stomach/Bowel: Previously demonstrated segmental wall thickening involving the mid to distal sigmoid colon with 2 adjacent fluid collections, 1 containing gas. There is rim enhancement surrounding the more inferior collection on the left. This collection measures 4.6 x 3.9 cm on image number 59/3, previously 4.4 x 2.9 cm. The more  superior collection has partial surrounding rim enhancement continues to cane chain some gas. This measures 5.4 x 3.6 cm on image number 54/3 and previously measured 5.4 x 3.9 cm. No new fluid collections. No free peritoneal air. Unremarkable stomach and small bowel.  Surgically absent appendix. Vascular/Lymphatic: Atheromatous arterial calcifications without aneurysm. No enlarged lymph nodes. Reproductive: Moderately enlarged prostate gland. Other: Small bilateral inguinal hernias containing fat. Musculoskeletal: Lumbar spine postsurgical changes with fixation hardware and interbody fusion at the L4-5 level. Lumbar and lower thoracic spine degenerative changes. IMPRESSION: 1. Persistent findings of perforated sigmoid diverticulitis with 2 adjacent fluid collections, 1 containing gas. The more inferior collection has slightly increased in size and has thin surrounding rim enhancement, suspicious for abscess. The more superior collection containing some gas is stable in size. 2. No new fluid collections. 3. Moderately enlarged prostate gland. Electronically Signed: By: Catherin Closs M.D. On: 01/29/2024 16:31   ECHOCARDIOGRAM COMPLETE Result Date: 01/28/2024    ECHOCARDIOGRAM REPORT   Patient Name:   [name] [name]. Date of Exam: 01/28/2024 Medical Rec #:  [id number]           Height:       66.5 in Accession #:    [id number]          Weight:       148.0 lb Date of Birth:  May 18, 1946           BSA:          1.769 m Patient Age:    41 years  BP:           114/64 mmHg Patient Gender: M                   HR:           72 bpm. Exam Location:  Inpatient Procedure: 2D Echo, Cardiac Doppler and Color Doppler (Both Spectral and Color            Flow Doppler were utilized during procedure). Indications:    Z01.818 Encounter for other preprocedural examination  History:        Patient has prior history of Echocardiogram examinations, most                 recent 02/11/2022.  Sonographer:    Andrena Bang Referring Phys:  [id number] LINDSAY B ROBERTS IMPRESSIONS  1. There is swirling of definity  contrast in the apex with no obvious LV thrombus. Findings c/w sluggish flow in the apex. Cannot rule out early forming thrombus. . Left ventricular ejection fraction, by estimation, is 25 to 30%. The left ventricle has severely decreased function. The left ventricle demonstrates global hypokinesis. There is mild left ventricular hypertrophy of the infero-lateral segment. Left ventricular diastolic function could not be evaluated.  2. Right ventricular systolic function is mildly reduced. The right ventricular size is normal. Tricuspid regurgitation signal is inadequate for assessing PA pressure.  3. The mitral valve is degenerative. Trivial mitral valve regurgitation. No evidence of mitral stenosis.  4. The aortic valve is tricuspid. Aortic valve regurgitation is not visualized. Aortic valve sclerosis/calcification is present, without any evidence of aortic stenosis. Aortic valve area, by VTI measures 2.91 cm. Aortic valve mean gradient measures 2.0 mmHg. Aortic valve Vmax measures 1.01 m/s.  5. The inferior vena cava is normal in size with greater than 50% respiratory variability, suggesting right atrial pressure of 3 mmHg. FINDINGS  Left Ventricle: There is swirling of definity  contrast in the apex with no obvious LV thrombus. Findings c/w sluggish flow in the apex. Cannot rule out early forming thrombus. Left ventricular ejection fraction, by estimation, is 25 to 30%. The left ventricle has severely decreased function. The left ventricle demonstrates global hypokinesis. Definity  contrast agent was given IV to delineate the left ventricular endocardial borders. The left ventricular internal cavity size was normal in size. There  is mild left ventricular hypertrophy of the infero-lateral segment. Abnormal (paradoxical) septal motion, consistent with left bundle branch block. Left ventricular diastolic function could not be evaluated. Right  Ventricle: The right ventricular size is normal. No increase in right ventricular wall thickness. Right ventricular systolic function is mildly reduced. Tricuspid regurgitation signal is inadequate for assessing PA pressure. Left Atrium: Left atrial size was normal in size. Right Atrium: Right atrial size was normal in size. Pericardium: There is no evidence of pericardial effusion. Mitral Valve: The mitral valve is degenerative in appearance. There is mild thickening of the mitral valve leaflet(s). There is mild calcification of the mitral valve leaflet(s). Trivial mitral valve regurgitation. No evidence of mitral valve stenosis. Tricuspid Valve: The tricuspid valve is normal in structure. Tricuspid valve regurgitation is trivial. No evidence of tricuspid stenosis. Aortic Valve: The aortic valve is tricuspid. Aortic valve regurgitation is not visualized. Aortic valve sclerosis/calcification is present, without any evidence of aortic stenosis. Aortic valve mean gradient measures 2.0 mmHg. Aortic valve peak gradient measures 4.1 mmHg. Aortic valve area, by VTI measures 2.91 cm. Pulmonic Valve: The pulmonic valve was normal in structure. Pulmonic valve regurgitation is not visualized.  No evidence of pulmonic stenosis. Aorta: The aortic root is normal in size and structure. Venous: The inferior vena cava is normal in size with greater than 50% respiratory variability, suggesting right atrial pressure of 3 mmHg. IAS/Shunts: No atrial level shunt detected by color flow Doppler.  LEFT VENTRICLE PLAX 2D LVIDd:         5.00 cm      Diastology LVIDs:         4.30 cm      LV e' lateral: 12.80 cm/s LV PW:         1.40 cm LV IVS:        0.80 cm LVOT diam:     2.20 cm LV SV:         56 LV SV Index:   32 LVOT Area:     3.80 cm  LV Volumes (MOD) LV vol d, MOD A2C: 138.0 ml LV vol d, MOD A4C: 129.0 ml LV vol s, MOD A2C: 76.0 ml LV vol s, MOD A4C: 82.5 ml LV SV MOD A2C:     62.0 ml LV SV MOD A4C:     129.0 ml LV SV MOD BP:       52.2 ml RIGHT VENTRICLE RV S prime:     7.40 cm/s TAPSE (M-mode): 1.4 cm LEFT ATRIUM             Index LA diam:        3.10 cm 1.75 cm/m LA Vol (A2C):   37.3 ml 21.08 ml/m LA Vol (A4C):   27.1 ml 15.32 ml/m LA Biplane Vol: 31.6 ml 17.86 ml/m  AORTIC VALVE AV Area (Vmax):    3.15 cm AV Area (Vmean):   3.06 cm AV Area (VTI):     2.91 cm AV Vmax:           101.00 cm/s AV Vmean:          65.800 cm/s AV VTI:            0.192 m AV Peak Grad:      4.1 mmHg AV Mean Grad:      2.0 mmHg LVOT Vmax:         83.80 cm/s LVOT Vmean:        52.900 cm/s LVOT VTI:          0.147 m LVOT/AV VTI ratio: 0.77  SHUNTS Systemic VTI:  0.15 m Systemic Diam: 2.20 cm Gaylyn Keas MD Electronically signed by Gaylyn Keas MD Signature Date/Time: 01/28/2024/10:01:41 AM    Final    CT Angio Chest/Abd/Pel for Dissection W and/or Wo Contrast Result Date: 01/26/2024 CLINICAL DATA:  Sharp chest pain since this morning, abdominal pain, nausea and vomiting for 4 days, pain radiating from upper abdomen to back EXAM: CT ANGIOGRAPHY CHEST, ABDOMEN AND PELVIS TECHNIQUE: Non-contrast CT of the chest was initially obtained. Multidetector CT imaging through the chest, abdomen and pelvis was performed using the standard protocol during bolus administration of intravenous contrast. Multiplanar reconstructed images and MIPs were obtained and reviewed to evaluate the vascular anatomy. RADIATION DOSE REDUCTION: This exam was performed according to the departmental dose-optimization program which includes automated exposure control, adjustment of the mA and/or kV according to patient size and/or use of iterative reconstruction technique. CONTRAST:  75mL OMNIPAQUE  IOHEXOL  350 MG/ML SOLN COMPARISON:  01/26/2024 FINDINGS: CTA CHEST FINDINGS Cardiovascular: No evidence of thoracic aortic aneurysm or dissection. Great vessels are patent. Diffuse atherosclerosis of the thoracic aorta. Prominent left ventricular dilatation. No pericardial effusion.  Dense  atherosclerosis throughout the coronary vasculature. There is technically adequate opacification of the pulmonary vasculature. No filling defects or pulmonary emboli. Mediastinum/Nodes: No significant findings within the thyroid , trachea, or esophagus. No pathologic adenopathy. Lungs/Pleura: Severe upper lobe predominant emphysema. No acute airspace disease, effusion, or pneumothorax. Mild bilateral bronchial wall thickening, greatest within the lower lobes, consistent with bronchitis or reactive airway disease. There is a 4 mm right upper lobe pulmonary nodule reference image 76/7, and a 3 mm right upper lobe pulmonary nodule reference image 70/7. Musculoskeletal: No acute or destructive bony abnormalities. Reconstructed images demonstrate no additional findings. Review of the MIP images confirms the above findings. CTA ABDOMEN AND PELVIS FINDINGS VASCULAR Aorta: Normal caliber aorta without aneurysm, dissection, vasculitis or significant stenosis. Diffuse atherosclerosis. Celiac: Patent without evidence of aneurysm, dissection, vasculitis or significant stenosis. SMA: There is moderate atherosclerosis within the proximal SMA, with estimated 50% stenosis proximally 3.5 cm from the origin. No aneurysm, dissection, or vasculitis. Renals: Both renal arteries are patent without evidence of aneurysm, dissection, vasculitis, fibromuscular dysplasia or significant stenosis. Mild atherosclerosis. IMA: Patent without evidence of aneurysm, dissection, vasculitis or significant stenosis. Inflow: There is extensive atherosclerosis at the aortic bifurcation and bilateral common iliac arteries. On the left, there is estimated 70-90% stenosis at the origin of the left common iliac artery. Stenosis on the right estimated approaching 50%. 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 Diffuse  atherosclerosis of the common femoral artery, with focal high-grade stenosis of the proximal left superficial femoral artery estimated 70-90%. There is extensive atherosclerosis throughout the right common iliac external iliac, and internal iliac arteries, with long segment stenosis of the external iliac artery estimated 50-70%. Diffuse atherosclerosis of the right common femoral and superficial femoral arteries without focal high-grade stenosis. Veins: No obvious venous abnormality within the limitations of this arterial phase study. Review of the MIP images confirms the above findings. NON-VASCULAR Hepatobiliary: 1.5 cm right lobe liver cyst. Otherwise no focal liver abnormalities or biliary duct dilation. The gallbladder is decompressed, limiting its evaluation. Pancreas: Unremarkable. No pancreatic ductal dilatation or surrounding inflammatory changes. Spleen: Normal in size without focal abnormality. Adrenals/Urinary Tract: Left renal cortical cyst does not require specific imaging follow-up. Otherwise the kidneys enhance normally. No urinary tract calculi or obstructive uropathy. The adrenals and bladder appear unremarkable. Stomach/Bowel: There is segmental circumferential mural thickening within the mid sigmoid colon, extending approximately 5.7 cm in length reference image 269/6. There is an associated contained perforation measuring 6.1 x 4.0 x 6.0 cm, reference image 258/6. Overall, findings are consistent with perforated sigmoid diverticulitis with associated diverticular abscess. No bowel obstruction or ileus. Normal appendix right mid abdomen. Moderate retained stool throughout the colon. Lymphatic: No pathologic adenopathy within the abdomen or pelvis. Reproductive: Prostate is unremarkable. Other: No free fluid or free intraperitoneal gas. Small fat containing bilateral inguinal hernias. No bowel herniation. Musculoskeletal: No acute or destructive bony abnormalities. Prior L4-5 discectomy and  posterior fusion. Reconstructed images demonstrate no additional findings. Review of the MIP images confirms the above findings. IMPRESSION: Vascular: 1. No evidence of thoracoabdominal aortic aneurysm or dissection. 2. No evidence of pulmonary embolus. 3. Aortic Atherosclerosis (ICD10-I70.0). Coronary artery atherosclerosis. 4. 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 5. High-grade stenoses within the left common iliac and left common femoral arteries estimated at 70-90% as above, with complete occlusion of the left external iliac artery  with distal reconstitution via collaterals. 6. Extensive atherosclerosis of the right iliac system, with stenosis estimated at 50-70% throughout the right external iliac artery. Nonvascular: 1. Segmental concentric mural thickening of the mid sigmoid colon, with associated contained perforation. Findings are compatible with perforated diverticulitis and diverticular abscess, though close follow-up is recommended to document resolution and exclude underlying neoplasm. 2. Bilateral bronchial wall thickening, greatest in the lower lobes, consistent with bronchitis or reactive airway disease. No acute airspace disease. 3.  Emphysema (ICD10-J43.9). 4. Moderate retained stool throughout the colon compatible with constipation. No obstruction or ileus. 5. Small bilateral fat containing inguinal hernias. 6. Multiple pulmonary nodules. Most significant: Right solid pulmonary nodule within the upper lobe measuring 4 mm. Per Fleischner Society Guidelines,if patient is low risk for malignancy, no routine follow-up imaging is recommended. If patient is high risk for malignancy, a non-contrast Chest CT at 12 months is optional. If performed and the nodule is stable at 12 months, no further follow-up is recommended. These guidelines do not apply to immunocompromised patients and patients with cancer. Follow up in patients with significant comorbidities as clinically warranted. For lung  cancer screening, adhere to Lung-RADS guidelines. Reference: Radiology. 2017; 284(1):228-43. Critical Value/emergent results were called by telephone at the time of interpretation on 01/26/2024 at 3:09 pm to provider [facility] , who verbally acknowledged these results. Electronically Signed   By: Bobbye Burrow M.D.   On: 01/26/2024 15:14   DG Chest 2 View Result Date: 01/26/2024 CLINICAL DATA:  Chest pain EXAM: CHEST - 2 VIEW COMPARISON:  Chest radiograph dated 07/13/2021 FINDINGS: Normal lung volumes. No focal consolidations. No pleural effusion or pneumothorax. The heart size and mediastinal contours are within normal limits. Cervical spinal fixation hardware appears intact. IMPRESSION: No active cardiopulmonary disease. Electronically Signed   By: Limin  Xu M.D.   On: 01/26/2024 13:58    DISCHARGE EXAMINATION: Vitals:   02/08/24 1653 02/08/24 1942 02/09/24 0426 02/09/24 0743  BP: 126/65 115/75 109/71 123/68  Pulse: 68 70 77 78  Resp: 16 18 16    Temp: 97.9 F (36.6 C) (!) 97.4 F (36.3 C) (!) 97.5 F (36.4 C)   TempSrc:      SpO2: 95% 95% 97% 94%  Weight:      Height:       General appearance: Awake alert.  In no distress Resp: Clear to auscultation bilaterally.  Normal effort Cardio: S1-S2 is normal regular.  No S3-S4.  No rubs murmurs or bruit GI: Abdomen is soft.  Nontender nondistended.  Bowel sounds are present normal.  No masses organomegaly  DISPOSITION: Home  Discharge Instructions     (HEART FAILURE PATIENTS) Call MD:  Anytime you have any of the following symptoms: 1) 3 pound weight gain in 24 hours or 5 pounds in 1 week 2) shortness of breath, with or without a dry hacking cough 3) swelling in the hands, feet or stomach 4) if you have to sleep on extra pillows at night in order to breathe.   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   Call MD for:  difficulty breathing, headache or visual disturbances   Complete by: As directed    Call MD for:  extreme fatigue   Complete by: As directed    Call MD for:  persistant dizziness or light-headedness   Complete by: As  directed    Call MD for:  persistant nausea and vomiting   Complete by: As directed    Call MD for:  redness, tenderness, or signs of infection (pain, swelling, redness, odor or green/yellow discharge around incision site)   Complete by: As directed    Call MD for:  severe uncontrolled pain   Complete by: As directed    Call MD for:  temperature >100.4   Complete by: As directed    Diet - low sodium heart healthy   Complete by: As directed    Discharge instructions   Complete by: As directed    Please take your meds as prescribed and follow instructions provided by the surgical team. Keep your follow up appointments.  You were cared for by a hospitalist during your hospital stay. If you have any questions about your discharge medications or the care you received while you were in the hospital after you are discharged, you can call the unit and asked to speak with the hospitalist on call if the hospitalist that took care of you is not available. Once you are discharged, your primary care physician will handle any further medical issues. Please note that NO REFILLS for any discharge medications will be authorized once you are discharged, as it is imperative that you return to your primary care physician (or establish a relationship with a primary care physician if you do not have one) for your aftercare needs so that they can reassess your need for medications and monitor your lab values. If you do not have a primary care physician, you can call [phone] for a physician referral.   Discharge wound care:   Complete by: As directed    Wound care  2 times daily      Comments: Cleanse with NS, pat gently dry. Fill defects with saline moistened roll gauze,  top with dry gauze, ABD pads and secure with tape. Change PRN for soiling, otherwise twice daily.   Increase activity slowly   Complete by: As directed          Allergies as of 02/09/2024       Reactions   Carvedilol  Diarrhea   Lipitor [atorvastatin] Other (See Comments)   "Makes pt feel funky"   Plavix  [clopidogrel ] Rash        Medication List     STOP taking these medications    aspirin  81 MG chewable tablet   augmented betamethasone dipropionate 0.05 % cream Commonly known as: DIPROLENE-AF   fluticasone  50 MCG/ACT nasal spray Commonly known as: FLONASE    losartan  50 MG tablet Commonly known as: COZAAR        TAKE these medications    Acetaminophen  Extra Strength 500 MG Tabs Take 1 tablet (500 mg total) by mouth every 6 (six) hours for 7 days.   albuterol  108 (90 Base) MCG/ACT inhaler Commonly known as: VENTOLIN  HFA Inhale 2 puffs into the lungs every 6 (six) hours as needed for wheezing (cough).   cholecalciferol  25 MCG (1000 UNIT) tablet Commonly known as: VITAMIN D3 Take 1,000 Units by mouth daily.   Eliquis 5 MG Tabs tablet Generic drug: apixaban Take 1 tablet (5 mg total) by mouth 2 (two) times daily.   ketoconazole 2 % cream Commonly known as: NIZORAL Apply 1 Application topically 2 (two) times daily.   metoprolol  tartrate 25 MG tablet Commonly known as: LOPRESSOR  Take 0.5 tablets (12.5 mg total) by mouth 2 (two) times daily.   nitroGLYCERIN  0.4 MG SL tablet Commonly known as:  Nitrostat  Place 1 tablet (0.4 mg total) under the tongue every 5 (five) minutes as needed for chest pain.   oxyCODONE  5 MG immediate release tablet Commonly known as: Oxy IR/ROXICODONE  Take 1-2 tablets (5-10 mg total) by mouth every 6 (six) hours as needed for severe pain (pain score 7-10).   polyethylene glycol powder 17 GM/SCOOP powder Commonly known as: GLYCOLAX /MIRALAX  Take 17 g by mouth daily.   rosuvastatin  20 MG tablet Commonly known as: CRESTOR  Take 1  tablet (20 mg total) by mouth daily.               Discharge Care Instructions  (From admission, onward)           Start     Ordered   02/09/24 0000  Discharge wound care:       Comments: Wound care  2 times daily      Comments: Cleanse with NS, pat gently dry. Fill defects with saline moistened roll gauze, top with dry gauze, ABD pads and secure with tape. Change PRN for soiling, otherwise twice daily.   02/09/24 8119              Follow-up Information     Rakes, Georgeann Kindred, FNP Follow up.   Specialty: Family Medicine Contact information: [address] [place] [address] [phone]         Marlyse Single T, PA-C Follow up.   Specialties: Cardiology, Physician Assistant Why: Follow-up with Cardiolog scheduled for 02/19/2024 at 10:05am. Please arrive 15 minutes early for check-in. If this date/ time does not work for you, please call our office to reschedule. Contact information: [address] [place] [address] [phone]         [facility] Follow up.   Specialty: Home Health Services Why: Someone will call you to schedule first home visit. Contact information: [address] [place] [address] [phone]         Sumner Ends, MD Follow up on 02/22/2024.   Specialty: Oncology Why: on 02/22/24 at 1:40pm Contact information: [address] [place] [address] [phone]         Davonna Estes Willeen Harold, MD Follow up.   Specialty: General Surgery Contact information: [address] [place] [address] [phone]                 TOTAL DISCHARGE TIME: 35 minutes  Colbe Viviano Lyndon Santiago  [facility] Pager on [email or web address]  02/09/2024, 10:43 AM

## 2024-02-09 NOTE — Progress Notes (Signed)
 General Surgery Follow Up Note  Subjective:    Overnight Issues:  NAEO. Pain currently controlled.  Having flatus and BMs via ostomy.  Tolerating soft diet. Denies nausea, vomiting, or belching, has been walking the halls and doing the stairs. Lives alone. Working with WOC but has trouble seeing his stoma - may be using mirror  Objective:  Vital signs for last 24 hours: Temp:  [97.4 F (36.3 C)-97.9 F (36.6 C)] 97.5 F (36.4 C) (05/27 0426) Pulse Rate:  [68-78] 78 (05/27 0743) Resp:  [16-18] 16 (05/27 0426) BP: (109-126)/(65-75) 123/68 (05/27 0743) SpO2:  [94 %-97 %] 94 % (05/27 0743)  Hemodynamic parameters for last 24 hours:    Intake/Output from previous day: 05/26 0701 - 05/27 0700 In: 357 [P.O.:357] Out: -   Intake/Output this shift: No intake/output data recorded.  Vent settings for last 24 hours:    Physical Exam:  Gen: comfortable, no distress Neuro: follows commands, alert, communicative HEENT: PERRL Neck: supple CV: RRR Pulm: unlabored breathing on RA Abd: soft, NT, incision clean, dry, intact, JP serous GU: urine clear and yellow, +spontaneous void Extr: wwp, no edema  Results for orders placed or performed during the hospital encounter of 01/26/24 (from the past 24 hours)  CBC     Status: Abnormal   Collection Time: 02/09/24  5:38 AM  Result Value Ref Range   WBC 8.1 4.0 - 10.5 K/uL   RBC 4.41 4.22 - 5.81 MIL/uL   Hemoglobin 12.7 (L) 13.0 - 17.0 g/dL   HCT 16.1 (L) 09.6 - 04.5 %   MCV 86.8 80.0 - 100.0 fL   MCH 28.8 26.0 - 34.0 pg   MCHC 33.2 30.0 - 36.0 g/dL   RDW 40.9 81.1 - 91.4 %   Platelets 406 (H) 150 - 400 K/uL   nRBC 0.0 0.0 - 0.2 %  Basic metabolic panel with GFR     Status: Abnormal   Collection Time: 02/09/24  5:38 AM  Result Value Ref Range   Sodium 136 135 - 145 mmol/L   Potassium 4.6 3.5 - 5.1 mmol/L   Chloride 108 98 - 111 mmol/L   CO2 23 22 - 32 mmol/L   Glucose, Bld 97 70 - 99 mg/dL   BUN 24 (H) 8 - 23 mg/dL    Creatinine, Ser 7.82 0.61 - 1.24 mg/dL   Calcium  8.5 (L) 8.9 - 10.3 mg/dL   GFR, Estimated >95 >62 mL/min   Anion gap 5 5 - 15  Magnesium     Status: None   Collection Time: 02/09/24  5:38 AM  Result Value Ref Range   Magnesium 2.2 1.7 - 2.4 mg/dL      Assessment & Plan:  Present on Admission:  Primary hypertension  Mixed hyperlipidemia  HFrEF (heart failure with reduced ejection fraction) (HCC)  CAD (coronary artery disease)  Acute diverticulitis    LOS: 14 days   Additional comments:I reviewed the patient's new clinical lab test results.   and I reviewed the patients new imaging test results.    POD8 s/p open sigmoidectomy by Dr. 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 Scherrie Curt, has been notified and pt has outpatient appointment - No abx necessary at present, completed 5d postop course  - JP out prior to d/c - Cont soft diet today. Cont  shakes.  - WOCN following for new ostomy - Mobilize, PT - no PT f/u recommended - Pulm toilet   FEN - soft, shakes, IVF per primary  VTE - SCDs, heparin  gtt; hgb stable, back on Eliquis. Ostomy output brown. ID - no abx at present Foley - Spont void.   Dispo: Awaiting final teaching from [facility] team, if comfortable with this after, ok for discharge from surgical perspective   LV thrombus? PAD HFrEF CAD HLD HTN Remote H/O polio    Beatris Lincoln, MD [facility] Surgery, A [facility]   02/09/2024  *Care during the described time interval was provided by me. I have reviewed this patient's available data, including medical history, events of note, physical examination and test results as part of my evaluation.

## 2024-02-10 ENCOUNTER — Telehealth: Payer: Self-pay | Admitting: *Deleted

## 2024-02-10 NOTE — Transitions of Care (Post Inpatient/ED Visit) (Signed)
   02/10/2024  Name: Duc Crocket. MRN: [id number] DOB: [birth year]  Today's TOC FU Call Status: Today's TOC FU Call Status:: Unsuccessful Call (1st Attempt) Unsuccessful Call (1st Attempt) Date: 02/10/24  Attempted to reach the patient regarding the most recent Inpatient/ED visit.  Follow Up Plan: Additional outreach attempts will be made to reach the patient to complete the Transitions of Care (Post Inpatient/ED visit) call.   Cecilie Coffee [facility], BSN RN Care Manager/ Transition of Care [place]/ [facility] [phone]

## 2024-02-11 ENCOUNTER — Telehealth: Payer: Self-pay | Admitting: *Deleted

## 2024-02-11 NOTE — Transitions of Care (Post Inpatient/ED Visit) (Signed)
   02/11/2024  Name: [name] MRN: [id number] DOB: [birth year]  Today's TOC FU Call Status: Today's TOC FU Call Status:: Unsuccessful Call (2nd Attempt) Unsuccessful Call (2nd Attempt) Date: 02/11/24  Attempted to reach the patient regarding the most recent Inpatient/ED visit. Patient answered the phone and states he is not at home and will not be back home until later today, requests a call back tomorrow 02/12/24.  Follow Up Plan: Additional outreach attempts will be made to reach the patient to complete the Transitions of Care (Post Inpatient/ED visit) call.   Cecilie Coffee [facility], BSN RN Care Manager/ Transition of Care [place]/ [facility] [phone]

## 2024-02-12 ENCOUNTER — Telehealth: Payer: Self-pay

## 2024-02-12 ENCOUNTER — Telehealth: Payer: Self-pay | Admitting: *Deleted

## 2024-02-12 NOTE — Transitions of Care (Post Inpatient/ED Visit) (Signed)
 02/12/2024  Name: [name] [name]. MRN: [id number] DOB: 1945/10/20  Today's TOC FU Call Status: Today's TOC FU Call Status:: Successful TOC FU Call Completed TOC FU Call Complete Date: 02/12/24 Patient's Name and Date of Birth confirmed.  Transition Care Management Follow-up Telephone Call Date of Discharge: 02/09/24 Discharge Facility: [facility]) Type of Discharge: Inpatient Admission Primary Inpatient Discharge Diagnosis:: Diverticulitis How have you been since you were released from the hospital?: Better Any questions or concerns?: No  Items Reviewed: Did you receive and understand the discharge instructions provided?: Yes Any new allergies since your discharge?: No Dietary orders reviewed?: Yes Type of Diet Ordered:: heart healthy Do you have support at home?: Yes People in Home [RPT]: alone Name of Support/Comfort Primary Source: can call on son [name] [name]  Medications Reviewed Today: Medications Reviewed Today     Reviewed by Daralyn Earl, RN (Registered Nurse) on 02/12/24 at 1442  Med List Status: <None>   Medication Order Taking? Sig Documenting Provider Last Dose Status Informant  acetaminophen  (TYLENOL ) 500 MG tablet [id number] Yes Take 1 tablet (500 mg total) by mouth every 6 (six) hours for 7 days. Maylene Spear, MD Taking Active   albuterol  (VENTOLIN  HFA) 108 [phone] Base) MCG/ACT inhaler [id number] Yes Inhale 2 puffs into the lungs every 6 (six) hours as needed for wheezing (cough). Galvin Jules, FNP Taking Active Self, Pharmacy Records  apixaban (ELIQUIS) 5 MG TABS tablet [id number] Yes Take 1 tablet (5 mg total) by mouth 2 (two) times daily. Maylene Spear, MD Taking Active   cholecalciferol  (VITAMIN D3) 25 MCG (1000 UNIT) tablet [id number] Yes Take 1,000 Units by mouth daily.  [provider] Taking Active Self, Pharmacy Records  ketoconazole (NIZORAL) 2 % cream [id number] Yes Apply 1 Application topically 2 (two) times daily. [provider] Taking Active Self, Pharmacy Records  metoprolol  tartrate (LOPRESSOR ) 25 MG tablet [id number] Yes Take 0.5 tablets (12.5 mg total) by mouth 2 (two) times daily. Maylene Spear, MD Taking Active   nitroGLYCERIN  (NITROSTAT ) 0.4 MG SL tablet [id number] Yes Place 1 tablet (0.4 mg total) under the tongue every 5 (five) minutes as needed for chest pain. Zorita Hiss, FNP Taking Active Self, Pharmacy Records  oxyCODONE  (OXY IR/ROXICODONE ) 5 MG immediate release tablet [id number] No Take 1-2 tablets (5-10 mg total) by mouth every 6 (six) hours as needed for severe pain (pain score 7-10).  Patient not taking: Reported on 02/12/2024   Krishnan, Gokul, MD Not Taking Active   polyethylene glycol powder (GLYCOLAX /MIRALAX ) 17 GM/SCOOP powder [id number] Yes Take 17 g by mouth daily. Krishnan, Gokul, MD Taking Active   rosuvastatin  (CRESTOR ) 20 MG tablet [id number] No Take 1 tablet (20 mg total) by mouth daily.  Patient not taking: Reported on 02/12/2024   Galvin Jules, FNP Not Taking Active Self, Pharmacy Records            Home Care and Equipment/Supplies: Were Home Health Services Ordered?: Yes Name of Home Health Agency:: [facility] Has Agency set up a time to come to your home?: No (per pt he has been contacted, first visit will be 02/14/24) EMR reviewed for Home Health Orders: Orders present/patient has not received call (refer to CM for follow-up) Any new equipment or medical supplies ordered?: No  Functional Questionnaire: Do you need assistance with bathing/showering or dressing?: No Do you need assistance with meal preparation?: No Do you need assistance with eating?: No Do you have difficulty maintaining continence: No (pt has colostomy) Do  you have difficulty managing or taking your medications?: No  Follow up appointments reviewed: PCP Follow-up appointment confirmed?: No MD Provider Line [phone] Given: No Specialist Hospital Follow-up appointment confirmed?:  Yes Do you need transportation to your follow-up appointment?: No Do you understand care options if your condition(s) worsen?: Yes-patient verbalized understanding  SDOH Interventions Today    Flowsheet Row Most Recent Value  SDOH Interventions   Food Insecurity Interventions Intervention Not Indicated  Housing Interventions Intervention Not Indicated  Transportation Interventions Intervention Not Indicated  Utilities Interventions Intervention Not Indicated       Cecilie Coffee [facility], BSN RN Care Manager/ Transition of Care [place]/ [facility] [phone]

## 2024-02-12 NOTE — Telephone Encounter (Signed)
 Copied from CRM [id number]. Topic: Clinical - Home Health Verbal Orders >> Feb 12, 2024  3:29 PM Star [place] wrote: Caller/Agency: Carmelina Chinchilla with [facility] Callback Number: [phone] confidential voicemail Service Requested: Skilled Nursing Frequency: start date 6/1 Any new concerns about the patient? No Was discharged from hospital on 5/27 from [facility] with new colostomy

## 2024-02-14 DIAGNOSIS — I110 Hypertensive heart disease with heart failure: Secondary | ICD-10-CM | POA: Diagnosis not present

## 2024-02-14 DIAGNOSIS — D630 Anemia in neoplastic disease: Secondary | ICD-10-CM | POA: Diagnosis not present

## 2024-02-14 DIAGNOSIS — N529 Male erectile dysfunction, unspecified: Secondary | ICD-10-CM | POA: Diagnosis not present

## 2024-02-14 DIAGNOSIS — C189 Malignant neoplasm of colon, unspecified: Secondary | ICD-10-CM | POA: Diagnosis not present

## 2024-02-14 DIAGNOSIS — Z48815 Encounter for surgical aftercare following surgery on the digestive system: Secondary | ICD-10-CM | POA: Diagnosis not present

## 2024-02-14 DIAGNOSIS — Z433 Encounter for attention to colostomy: Secondary | ICD-10-CM | POA: Diagnosis not present

## 2024-02-14 DIAGNOSIS — Z7901 Long term (current) use of anticoagulants: Secondary | ICD-10-CM | POA: Diagnosis not present

## 2024-02-14 DIAGNOSIS — I739 Peripheral vascular disease, unspecified: Secondary | ICD-10-CM | POA: Diagnosis not present

## 2024-02-14 DIAGNOSIS — K5790 Diverticulosis of intestine, part unspecified, without perforation or abscess without bleeding: Secondary | ICD-10-CM | POA: Diagnosis not present

## 2024-02-14 DIAGNOSIS — I2510 Atherosclerotic heart disease of native coronary artery without angina pectoris: Secondary | ICD-10-CM | POA: Diagnosis not present

## 2024-02-14 DIAGNOSIS — I5022 Chronic systolic (congestive) heart failure: Secondary | ICD-10-CM | POA: Diagnosis not present

## 2024-02-14 DIAGNOSIS — E782 Mixed hyperlipidemia: Secondary | ICD-10-CM | POA: Diagnosis not present

## 2024-02-14 DIAGNOSIS — I252 Old myocardial infarction: Secondary | ICD-10-CM | POA: Diagnosis not present

## 2024-02-14 DIAGNOSIS — Z6823 Body mass index (BMI) 23.0-23.9, adult: Secondary | ICD-10-CM | POA: Diagnosis not present

## 2024-02-14 DIAGNOSIS — Z87891 Personal history of nicotine dependence: Secondary | ICD-10-CM | POA: Diagnosis not present

## 2024-02-14 DIAGNOSIS — Z955 Presence of coronary angioplasty implant and graft: Secondary | ICD-10-CM | POA: Diagnosis not present

## 2024-02-15 ENCOUNTER — Telehealth: Payer: Self-pay

## 2024-02-15 NOTE — Telephone Encounter (Signed)
 Copied from CRM [id number]. Topic: Clinical - Home Health Verbal Orders >> Feb 15, 2024 11:55 AM Rennis Case wrote: Caller/AgencyOralee Billow w/ [facility]  Callback Number: [phone]- Secure voicemail, can leave message  Service Requested: Skilled Nursing for a new ostomy & incision w/ Staples  Frequency: 3x2, 1x7, 2 PRN's  Any new concerns about the patient? No

## 2024-02-15 NOTE — Telephone Encounter (Signed)
 Verbal given

## 2024-02-16 ENCOUNTER — Other Ambulatory Visit: Payer: Self-pay | Admitting: *Deleted

## 2024-02-16 ENCOUNTER — Ambulatory Visit (INDEPENDENT_AMBULATORY_CARE_PROVIDER_SITE_OTHER): Payer: Medicare Other

## 2024-02-16 VITALS — BP 134/69 | HR 86 | Ht 66.0 in | Wt 138.0 lb

## 2024-02-16 DIAGNOSIS — Z0000 Encounter for general adult medical examination without abnormal findings: Secondary | ICD-10-CM | POA: Diagnosis not present

## 2024-02-16 DIAGNOSIS — I739 Peripheral vascular disease, unspecified: Secondary | ICD-10-CM

## 2024-02-16 NOTE — Patient Instructions (Addendum)
 [name] [name] , Thank you for taking time out of your busy schedule to complete your Annual Wellness Visit with me. I enjoyed our conversation and look forward to speaking with you again next year. I, as well as your care team,  appreciate your ongoing commitment to your health goals. Please review the following plan we discussed and let me know if I can assist you in the future. Your Game plan/ To Do List    Follow up Visits: Next Medicare AWV with our clinical staff: 02/16/25 at 8:00a.m.   Next Office Visit with your provider: 02/24/24 at 12:05p.m.  Clinician Recommendations:  Aim for 30 minutes of exercise or brisk walking, 6-8 glasses of water, and 5 servings of fruits and vegetables each day.       This is a list of the screening recommended for you and due dates:  Health Maintenance  Topic Date Due   Pneumonia Vaccine (2 of 2 - PCV) 03/29/2014   COVID-19 Vaccine (1) 02/29/2024*   Zoster (Shingles) Vaccine (1 of 2) 04/08/2024*   DTaP/Tdap/Td vaccine (1 - Tdap) 01/07/2025*   Flu Shot  04/15/2024   Medicare Annual Wellness Visit  02/15/2025   Hepatitis C Screening  Completed   HPV Vaccine  Aged Out   Meningitis B Vaccine  Aged Out  *Topic was postponed. The date shown is not the original due date.    Advanced directives: (Declined) Advance directive discussed with you today. Even though you declined this today, please call our office should you change your mind, and we can give you the proper paperwork for you to fill out. Advance Care Planning is important because it:  [x]  Makes sure you receive the medical care that is consistent with your values, goals, and preferences  [x]  It provides guidance to your family and loved ones and reduces their decisional burden about whether or not they are making the right decisions based on your wishes.  Follow the link provided in your after visit summary or read over the paperwork we have mailed to you to help you started getting your Advance  Directives in place. If you need assistance in completing these, please reach out to us  so that we can help you!  See attachments for Preventive Care and Fall Prevention Tips.

## 2024-02-16 NOTE — Progress Notes (Signed)
 Subjective:   [name] [name]. is a 78 y.o. who presents for a Medicare Wellness preventive visit.  As a reminder, Annual Wellness Visits don't include a physical exam, and some assessments may be limited, especially if this visit is performed virtually. We may recommend an in-person follow-up visit with your provider if needed.  Visit Complete: Virtual I connected with  [name] [name]. on 02/16/24 by a audio enabled telemedicine application and verified that I am speaking with the correct person using two identifiers.  Patient Location: Home  Provider Location: Home Office  I discussed the limitations of evaluation and management by telemedicine. The patient expressed understanding and agreed to proceed.  Vital Signs: Because this visit was a virtual/telehealth visit, some criteria may be missing or patient reported. Any vitals not documented were not able to be obtained and vitals that have been documented are patient reported.  VideoDeclined- This patient declined Librarian, academic. Therefore the visit was completed with audio only.  Persons Participating in Visit: Patient.  AWV Questionnaire: No: Patient Medicare AWV questionnaire was not completed prior to this visit.  Cardiac Risk Factors include: advanced age (>62men, >21 women);sedentary lifestyle;male gender;hypertension (heart failure, CAD)     Objective:     Today's Vitals   02/16/24 0804  BP: 134/69  Pulse: 86  Weight: 138 lb (62.6 kg)  Height: 5\' 6"  (1.676 m)   Body mass index is 22.27 kg/m.     02/16/2024    8:19 AM 01/27/2024    3:52 PM 01/26/2024   12:46 PM 02/13/2023    8:24 AM 06/05/2022    3:26 PM 02/11/2022    8:26 AM 07/13/2021    3:06 AM  Advanced Directives  Does Patient Have a Medical Advance Directive? Yes  No Yes Yes Yes No;Yes  Type of Estate agent of [place];Living will   Healthcare Power of [place];Living will Healthcare Power of  [place];Living will Healthcare Power of [place];Living will Healthcare Power of Attorney  Does patient want to make changes to medical advance directive?    No - Patient declined No - Patient declined  No - Patient declined  Copy of Healthcare Power of Attorney in Chart? Yes - validated most recent copy scanned in chart (See row information)   Yes - validated most recent copy scanned in chart (See row information) No - copy requested No - copy requested   Would patient like information on creating a medical advance directive?  No - Patient declined         Current Medications (verified) Outpatient Encounter Medications as of 02/16/2024  Medication Sig   acetaminophen  (TYLENOL ) 500 MG tablet Take 1 tablet (500 mg total) by mouth every 6 (six) hours for 7 days.   albuterol  (VENTOLIN  HFA) 108 (90 Base) MCG/ACT inhaler Inhale 2 puffs into the lungs every 6 (six) hours as needed for wheezing (cough).   apixaban  (ELIQUIS ) 5 MG TABS tablet Take 1 tablet (5 mg total) by mouth 2 (two) times daily.   cholecalciferol  (VITAMIN D3) 25 MCG (1000 UNIT) tablet Take 1,000 Units by mouth daily.    ketoconazole (NIZORAL) 2 % cream Apply 1 Application topically 2 (two) times daily.   metoprolol  tartrate (LOPRESSOR ) 25 MG tablet Take 0.5 tablets (12.5 mg total) by mouth 2 (two) times daily.   nitroGLYCERIN  (NITROSTAT ) 0.4 MG SL tablet Place 1 tablet (0.4 mg total) under the tongue every 5 (five) minutes as needed for chest pain.   oxyCODONE  (  OXY IR/ROXICODONE ) 5 MG immediate release tablet Take 1-2 tablets (5-10 mg total) by mouth every 6 (six) hours as needed for severe pain (pain score 7-10).   polyethylene glycol powder (GLYCOLAX /MIRALAX ) 17 GM/SCOOP powder Take 17 g by mouth daily.   rosuvastatin  (CRESTOR ) 20 MG tablet Take 1 tablet (20 mg total) by mouth daily.   No facility-administered encounter medications on file as of 02/16/2024.    Allergies (verified) Carvedilol , Lipitor [atorvastatin], and Plavix   [clopidogrel ]   History: Past Medical History:  Diagnosis Date   Coronary artery disease    Erectile dysfunction    HFrEF (heart failure with reduced ejection fraction) (HCC)    Echocardiogram 07/14/21:  EF 25, global HK, inf HK worse, Gr 2 DD, normal RVSF, mild to mod MR, AV sclerosis w/o AS Echocardiogram 2/23: EF 25-30, global HK, GLS -12.9, normal RVSF, mild to mod MR, AV sclerosis w/o AS    History of hiatal hernia    a lone time ago   Hyperlipidemia    [place] fracture [facility]) 03/22/2020   Myocardial infarction (HCC)    x 2   Polio    Past Surgical History:  Procedure Laterality Date   APPENDECTOMY     CARDIAC CATHETERIZATION     has two stents placed   CERVICAL SPINE SURGERY     c3-4, 4-5,  5-6, 8 screws and plates   COLOSTOMY Left 02/01/2024   Procedure: CREATION, COLOSTOMY;  Surgeon: Cannon Champion, MD;  Location: [facility];  Service: General;  Laterality: Left;   CORONARY STENT INTERVENTION N/A 07/15/2021   Procedure: CORONARY STENT INTERVENTION;  Surgeon: Avanell Leigh, MD;  Location: [facility];  Service: Cardiovascular;  Laterality: N/A;   CORONARY STENT PLACEMENT     forein body removal     metal sharp removed from right leg   LAPAROTOMY N/A 02/01/2024   Procedure: LAPAROTOMY, EXPLORATORY;  Surgeon: Cannon Champion, MD;  Location: [facility] OR;  Service: General;  Laterality: N/A;  POSSIBLE OSTOMY   LEFT HEART CATH AND CORONARY ANGIOGRAPHY N/A 11/15/2018   Procedure: LEFT HEART CATH AND CORONARY ANGIOGRAPHY;  Surgeon: Sammy Crisp, MD;  [facility]: [facility];  Service: Cardiovascular;  Laterality: N/A;   LEFT HEART CATH AND CORONARY ANGIOGRAPHY N/A 07/15/2021   Procedure: LEFT HEART CATH AND CORONARY ANGIOGRAPHY;  Surgeon: Avanell Leigh, MD;  [facility];  Service: Cardiovascular;  Laterality: N/A;   MANDIBULAR HARDWARE REMOVAL Bilateral 04/23/2020   Procedure: MANDIBULAR HARDWARE REMOVAL;  Surgeon: Janita Mellow, MD;  Location: [facility];  Service: ENT;  Laterality: Bilateral;   ORIF MANDIBULAR FRACTURE N/A 03/14/2020   Procedure: OPEN REDUCTION INTERNAL FIXATION (ORIF) MID  FACE FRACTURE, MANDIBULAR FIXATION MODIFIED ARCH BARS;  Surgeon: Janita Mellow, MD;  Location: [facility] OR;  Service: ENT;  Laterality: N/A;   PARTIAL COLECTOMY N/A 02/01/2024   Procedure: COLECTOMY, PARTIAL;  Surgeon: Cannon Champion, MD;  Location: [facility];  Service: General;  Laterality: N/A;  sigmoid colon   SCAR REVISION N/A 04/23/2020   Procedure: SCAR REVISION/ Upper Lip Repair;  Surgeon: Janita Mellow, MD;  Location: [facility];  Service: ENT;  Laterality: N/A;   Family History  Problem Relation Age of Onset   Diabetes Mother    Heart disease Mother    Heart disease Father    Social History   Socioeconomic History   Marital status: Divorced    Spouse name: Not on file   Number  of children: 2   Years of education: 12   Highest education level: 12th grade  Occupational History   Occupation: retired    Comment: farmer  Tobacco Use   Smoking status: Former    Types: Cigarettes    Passive exposure: Past   Smokeless tobacco: Never  Vaping Use   Vaping status: Never Used  Substance and Sexual Activity   Alcohol use: Yes    Alcohol/week: 21.0 standard drinks of alcohol    Types: 21 Cans of beer per week    Comment: 2 beers daily   Drug use: No   Sexual activity: Not on file  Other Topics Concern   Not on file  Social History Narrative   Mr. Biondolillo has a restraining order against one of his sons.   Social Drivers of Corporate investment banker Strain: Low Risk  (02/16/2024)   Overall Financial Resource Strain (CARDIA)    Difficulty of Paying Living Expenses: Not hard at all  Food Insecurity: No Food Insecurity (02/16/2024)   Hunger Vital Sign    Worried About Running Out of Food in the Last Year: Never true    Ran Out of Food in the Last Year: Never true  Transportation Needs: No Transportation Needs  (02/16/2024)   PRAPARE - Administrator, Civil Service (Medical): No    Lack of Transportation (Non-Medical): No  Physical Activity: Unknown (02/16/2024)   Exercise Vital Sign    Days of Exercise per Week: 0 days    Minutes of Exercise per Session: Not on file  Stress: No Stress Concern Present (02/16/2024)   [employer] of Occupational Health - Occupational Stress Questionnaire    Feeling of Stress : Only a little  Social Connections: Moderately Integrated (02/16/2024)   Social Connection and Isolation Panel [NHANES]    Frequency of Communication with Friends and Family: More than three times a week    Frequency of Social Gatherings with Friends and Family: More than three times a week    Attends Religious Services: More than 4 times per year    Active Member of [employer] or Organizations: Yes    Attends Engineer, structural: More than 4 times per year    Marital Status: Divorced    Tobacco Counseling Counseling given: Yes    Clinical Intake:  Pre-visit preparation completed: Yes  Pain : No/denies pain     BMI - recorded: 22.27 Nutritional Status: BMI of 19-24  Normal Nutritional Risks: None Diabetes: No  Lab Results  Component Value Date   HGBA1C 5.2 07/13/2021   HGBA1C 5.3 11/15/2018     How often do you need to have someone help you when you read instructions, pamphlets, or other written materials from your doctor or pharmacy?: 1 - Never  Interpreter Needed?: No  Comments: per pt just came home from the hospital about a week now. Information entered by :: Alia t/cma   Activities of Daily Living     02/16/2024    8:15 AM 01/27/2024    3:00 PM  In your present state of health, do you have any difficulty performing the following activities:  Hearing? 0 0  Vision? 0 0  Difficulty concentrating or making decisions? 0 0  Walking or climbing stairs? 0   Dressing or bathing? 0   Doing errands, shopping? 0   Preparing Food and eating ? N   Using  the Toilet? N   In the past six months, have you accidently leaked urine?  N   Do you have problems with loss of bowel control? Y   Managing your Medications? N   Managing your Finances? N   Housekeeping or managing your Housekeeping? Y   Comment currently pt's cousin helps w/cleaning     Patient Care Team: Rakes, Georgeann Kindred, FNP as PCP - General (Family Medicine) Nahser, Lela Purple, MD as PCP - Cardiology (Cardiology) Daralyn Earl, RN as [facility] Care Management  I have updated your Care Teams any recent Medical Services you may have received from other providers in the past year.     Assessment:    This is a routine wellness examination for [name] [name].  Hearing/Vision screen Hearing Screening - Comments:: Pt denies hearing dif Vision Screening - Comments:: Pt denies vision dif/pt goes to [facility] [address] last ov was 55yrs ago. Pt aware and is going to make an appt with them   Goals Addressed             This Visit's Progress    Patient Stated       To get well        Depression Screen     02/16/2024    8:30 AM 02/12/2024    2:43 PM 01/08/2024   10:02 AM 09/17/2023    1:20 PM 09/17/2023    1:19 PM 02/13/2023    8:20 AM 10/22/2022   10:25 AM  PHQ 2/9 Scores  PHQ - 2 Score 4 0 0 2 0 0 1  PHQ- 9 Score 5  2 2        Fall Risk     02/16/2024    8:21 AM 02/12/2024    3:12 PM 02/12/2024    2:45 PM 02/12/2024    2:43 PM 01/08/2024   10:01 AM  Fall Risk   Falls in the past year? 0 0  0 0  Number falls in past yr: 0    0  Injury with Fall? 0    0  Risk for fall due to : No Fall Risks    No Fall Risks  Follow up Falls evaluation completed  Falls evaluation completed;Falls prevention discussed  Falls evaluation completed    MEDICARE RISK AT HOME:  Medicare Risk at Home Any stairs in or around the home?: Yes If so, are there any without handrails?: No Home free of loose throw rugs in walkways, pet beds, electrical cords, etc?: Yes Adequate lighting in your  home to reduce risk of falls?: Yes Life alert?: No Use of a cane, walker or w/c?: No Grab bars in the bathroom?: No Shower chair or bench in shower?: No Elevated toilet seat or a handicapped toilet?: No  TIMED UP AND GO:  Was the test performed?  No  Cognitive Function: 6CIT completed        02/16/2024    8:33 AM 02/13/2023    8:21 AM 02/11/2022    8:28 AM 02/07/2021    8:53 AM  6CIT Screen  What Year? 0 points 0 points 0 points 0 points  What month? 0 points 0 points 0 points 0 points  What time? 0 points 0 points 0 points 0 points  Count back from 20 0 points 0 points 0 points 0 points  Months in reverse 0 points 0 points 0 points 0 points  Repeat phrase 0 points 0 points 0 points 0 points  Total Score 0 points 0 points 0 points 0 points    Immunizations Immunization History  Administered Date(s)  Administered   Pneumococcal Polysaccharide-23 03/29/2013    Screening Tests Health Maintenance  Topic Date Due   Pneumonia Vaccine 41+ Years old (2 of 2 - PCV) 03/29/2014   COVID-19 Vaccine (1) 02/29/2024 (Originally 01/09/1951)   Zoster Vaccines- Shingrix (1 of 2) 04/08/2024 (Originally 01/08/1965)   DTaP/Tdap/Td (1 - Tdap) 01/07/2025 (Originally 01/08/1965)   INFLUENZA VACCINE  04/15/2024   Medicare Annual Wellness (AWV)  02/15/2025   Hepatitis C Screening  Completed   HPV VACCINES  Aged Out   Meningococcal B Vaccine  Aged Out    Health Maintenance  Health Maintenance Due  Topic Date Due   Pneumonia Vaccine 33+ Years old (2 of 2 - PCV) 03/29/2014   Health Maintenance Items Addressed: See Nurse Notes at the end of this note  Additional Screening:  Vision Screening: Recommended annual ophthalmology exams for early detection of glaucoma and other disorders of the eye. Would you like a referral to an eye doctor? No    Dental Screening: Recommended annual dental exams for proper oral hygiene  Community Resource Referral / Chronic Care Management: CRR required this  visit?  Yes   CCM required this visit?  No   Plan:    I have personally reviewed and noted the following in the patient's chart:   Medical and social history Use of alcohol, tobacco or illicit drugs  Current medications and supplements including opioid prescriptions. Patient is currently taking opioid prescriptions. Information provided to patient regarding non-opioid alternatives. Patient advised to discuss non-opioid treatment plan with their provider. Functional ability and status Nutritional status Physical activity Advanced directives List of other physicians Hospitalizations, surgeries, and ER visits in previous 12 months Vitals Screenings to include cognitive, depression, and falls Referrals and appointments  In addition, I have reviewed and discussed with patient certain preventive protocols, quality metrics, and best practice recommendations. A written personalized care plan for preventive services as well as general preventive health recommendations were provided to patient.   Michaelle Adolphus, CMA   02/16/2024   After Visit Summary: (MyChart) Due to this being a telephonic visit, the after visit summary with patients personalized plan was offered to patient via MyChart   Notes: See Routing msg

## 2024-02-17 DIAGNOSIS — I739 Peripheral vascular disease, unspecified: Secondary | ICD-10-CM | POA: Diagnosis not present

## 2024-02-17 DIAGNOSIS — I2510 Atherosclerotic heart disease of native coronary artery without angina pectoris: Secondary | ICD-10-CM | POA: Diagnosis not present

## 2024-02-17 DIAGNOSIS — N529 Male erectile dysfunction, unspecified: Secondary | ICD-10-CM | POA: Diagnosis not present

## 2024-02-17 DIAGNOSIS — Z87891 Personal history of nicotine dependence: Secondary | ICD-10-CM | POA: Diagnosis not present

## 2024-02-17 DIAGNOSIS — E782 Mixed hyperlipidemia: Secondary | ICD-10-CM | POA: Diagnosis not present

## 2024-02-17 DIAGNOSIS — D630 Anemia in neoplastic disease: Secondary | ICD-10-CM | POA: Diagnosis not present

## 2024-02-17 DIAGNOSIS — Z48815 Encounter for surgical aftercare following surgery on the digestive system: Secondary | ICD-10-CM | POA: Diagnosis not present

## 2024-02-17 DIAGNOSIS — I5022 Chronic systolic (congestive) heart failure: Secondary | ICD-10-CM | POA: Diagnosis not present

## 2024-02-17 DIAGNOSIS — Z955 Presence of coronary angioplasty implant and graft: Secondary | ICD-10-CM | POA: Diagnosis not present

## 2024-02-17 DIAGNOSIS — Z433 Encounter for attention to colostomy: Secondary | ICD-10-CM | POA: Diagnosis not present

## 2024-02-17 DIAGNOSIS — I110 Hypertensive heart disease with heart failure: Secondary | ICD-10-CM | POA: Diagnosis not present

## 2024-02-17 DIAGNOSIS — Z7901 Long term (current) use of anticoagulants: Secondary | ICD-10-CM | POA: Diagnosis not present

## 2024-02-17 DIAGNOSIS — K5790 Diverticulosis of intestine, part unspecified, without perforation or abscess without bleeding: Secondary | ICD-10-CM | POA: Diagnosis not present

## 2024-02-17 DIAGNOSIS — I252 Old myocardial infarction: Secondary | ICD-10-CM | POA: Diagnosis not present

## 2024-02-17 DIAGNOSIS — C189 Malignant neoplasm of colon, unspecified: Secondary | ICD-10-CM | POA: Diagnosis not present

## 2024-02-17 DIAGNOSIS — Z6823 Body mass index (BMI) 23.0-23.9, adult: Secondary | ICD-10-CM | POA: Diagnosis not present

## 2024-02-18 ENCOUNTER — Other Ambulatory Visit: Payer: Self-pay | Admitting: *Deleted

## 2024-02-18 ENCOUNTER — Telehealth: Payer: Self-pay

## 2024-02-18 ENCOUNTER — Other Ambulatory Visit: Payer: Self-pay | Admitting: Pharmacist

## 2024-02-18 ENCOUNTER — Encounter: Payer: Self-pay | Admitting: *Deleted

## 2024-02-18 DIAGNOSIS — I513 Intracardiac thrombosis, not elsewhere classified: Secondary | ICD-10-CM | POA: Insufficient documentation

## 2024-02-18 DIAGNOSIS — I739 Peripheral vascular disease, unspecified: Secondary | ICD-10-CM | POA: Insufficient documentation

## 2024-02-18 NOTE — Progress Notes (Signed)
   Telephone encounter was:  Successful.  Complex Care Management Note Care Guide Note  02/18/2024 Name: [name]. MRN: [id number] DOB: 04/17/1946  [name] [name]. is a 78 y.o. year old male who is a primary care patient of Rakes, Georgeann Kindred, FNP . The community resource team was consulted for assistance with Food Insecurity, Financial Difficulties related to Financial strain, and RX  SDOH screenings and interventions completed:  No        Care guide performed the following interventions: Patient provided with information about care guide support team and interviewed to confirm resource needs.Patient stated he only has needs for RX assistance at this time. Pt has requested I mail resources for future food and financial needs   Follow Up Plan:  No further follow up planned at this time. The patient has been provided with needed resources.  Encounter Outcome:  Patient Visit Completed   Azell Leopard [facility]  [facility] Guide, Phone: [phone] Fax: [phone] Website: [email or web address]

## 2024-02-18 NOTE — Patient Outreach (Signed)
 Transition of Care week 2  Visit Note  02/18/2024  Name: [name] [name]. MRN: [id number]          DOB: 19-Mar-1946  Situation: Patient enrolled in [facility] 30-day program. Visit completed with patient by telephone.   Background:home health is working with pt Engineer, production / teaching with wound care, care of colostomy   Past Medical History:  Diagnosis Date   Coronary artery disease    Erectile dysfunction    HFrEF (heart failure with reduced ejection fraction) (HCC)    Echocardiogram 07/14/21:  EF 25, global HK, inf HK worse, Gr 2 DD, normal RVSF, mild to mod MR, AV sclerosis w/o AS Echocardiogram 2/23: EF 25-30, global HK, GLS -12.9, normal RVSF, mild to mod MR, AV sclerosis w/o AS    History of hiatal hernia    a lone time ago   Hyperlipidemia    [place] fracture [facility]) 03/22/2020   Myocardial infarction (HCC)    x 2   Polio     Assessment: Patient Reported Symptoms: Cognitive Cognitive Status: Able to follow simple commands, Alert and oriented to person, place, and time, Normal speech and language skills      Neurological Neurological Review of Symptoms: No symptoms reported    HEENT HEENT Symptoms Reported: No symptoms reported      Cardiovascular Cardiovascular Symptoms Reported: No symptoms reported    Respiratory Respiratory Symptoms Reported: No symptoms reported    Endocrine Patient reports the following symptoms related to hypoglycemia or hyperglycemia : No symptoms reported Is patient diabetic?: No    Gastrointestinal Gastrointestinal Symptoms Reported: No symptoms reported Gastrointestinal Conditions: Other Other Gastrointestinal Conditions: ostomy Gastrointestinal Management Strategies: Colostomy Gastrointestinal Self-Management Outcome: 4 (good) Gastrointestinal Comment: pt states colostomy appliance is intact, pt is independent with emptying, HH RN is teaching pt how to change wafer and care of peristomal skin Nutrition Risk Screen (CP): No indicators  present  Genitourinary Genitourinary Symptoms Reported: No symptoms reported    Integumentary Integumentary Symptoms Reported: Wound Additional Integumentary Details: abdominal wound, pt states home health nurse is seeing him at leasst 3 x per week and changing dressing, states wound care is not being done daily or BID, states this has been changed Skin Conditions: Wound Skin Management Strategies: Adequate rest, Routine screening Skin Self-Management Outcome: 3 (uncertain) Skin Comment: pt denies any signs/ symptoms of infection  Musculoskeletal Musculoskelatal Symptoms Reviewed: No symptoms reported        Psychosocial Psychosocial Symptoms Reported: No symptoms reported         There were no vitals filed for this visit.  Medications Reviewed Today     Reviewed by Daralyn Earl, RN (Registered Nurse) on 02/18/24 at [phone]  Med List Status: <None>   Medication Order Taking? Sig Documenting Provider Last Dose Status Informant  albuterol  (VENTOLIN  HFA) 108 (90 Base) MCG/ACT inhaler [id number] No Inhale 2 puffs into the lungs every 6 (six) hours as needed for wheezing (cough). Galvin Jules, FNP Taking Active Self, Pharmacy Records  apixaban  (ELIQUIS ) 5 MG TABS tablet [id number] No Take 1 tablet (5 mg total) by mouth 2 (two) times daily. Maylene Spear, MD Taking Active   cholecalciferol  (VITAMIN D3) 25 MCG (1000 UNIT) tablet [id number] No Take 1,000 Units by mouth daily.  [provider] Taking Active Self, Pharmacy Records  ketoconazole (NIZORAL) 2 % cream [id number] No Apply 1 Application topically 2 (two) times daily. [provider] Taking Active Self, Pharmacy Records  metoprolol  tartrate (LOPRESSOR ) 25 MG tablet  [id number] No Take 0.5 tablets (12.5 mg total) by mouth 2 (two) times daily. Maylene Spear, MD Taking Active   nitroGLYCERIN  (NITROSTAT ) 0.4 MG SL tablet [id number] No Place 1 tablet (0.4 mg total) under the tongue every 5 (five) minutes as needed for chest  pain. Zorita Hiss, FNP Taking Active Self, Pharmacy Records  oxyCODONE  (OXY IR/ROXICODONE ) 5 MG immediate release tablet [id number] No Take 1-2 tablets (5-10 mg total) by mouth every 6 (six) hours as needed for severe pain (pain score 7-10). Krishnan, Gokul, MD Taking Active   polyethylene glycol powder (GLYCOLAX /MIRALAX ) 17 GM/SCOOP powder [id number] No Take 17 g by mouth daily. Krishnan, Gokul, MD Taking Active   rosuvastatin  (CRESTOR ) 20 MG tablet [id number] No Take 1 tablet (20 mg total) by mouth daily. Galvin Jules, FNP Taking Active Self, Pharmacy Records            Recommendation:   PCP Follow-up Specialty provider follow-up 02/22/24 surgeon  Follow Up Plan:   Telephone follow-up 02/25/24 @ 215 pm  Cecilie Coffee [facility], BSN RN Care Manager/ Transition of Care [place]/ [facility] [phone]

## 2024-02-18 NOTE — Progress Notes (Signed)
 PATIENT NAVIGATOR PROGRESS NOTE  Name: [name] [name]. Date: 02/18/2024 MRN: [id number]  DOB: 30-Sep-1945   Reason for visit:  New patient appt  Comments:  Called and spoke with Mr Darley regarding New Pt appt on Monday 6/9 with Dr Scherrie Curt. Reviewed directions to building and parking as well as one support person allowed in the appt with him  Verbalized understanding and gave him contact number to call with any questions    Time spent counseling/coordinating care: 30-45 minutes

## 2024-02-18 NOTE — Progress Notes (Signed)
   This patient is appearing on a report for being at risk of failing the adherence measure for cholesterol (statin) and hypertension (ACEi/ARB) medications this calendar year.   Medication: losartan /rosuvastatin  Last fill date: 01/26/24 for 90 day supply  Insurance report was not up to date. No action needed at this time.    Cleone Hulick Dattero Henretta Quist, PharmD, BCACP, CPP Clinical Pharmacist, [facility]
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
## 2024-02-18 NOTE — Progress Notes (Signed)
 Care Guide Pharmacy Note  02/18/2024 Name: [name] [name]. MRN: [id number] DOB: 25-Feb-1946  Referred By: Galvin Jules, FNP Reason for referral: Complex Care Management (Outreach to schedule with Pharm d )   [name] is a 78 y.o. year old male who is a primary care patient of Rakes, Georgeann Kindred, FNP.  [name] was referred to the pharmacist for assistance related to: CAD  Successful contact was made with the patient to discuss pharmacy services including being ready for the pharmacist to call at least 5 minutes before the scheduled appointment time and to have medication bottles and any blood pressure readings ready for review. The patient agreed to meet with the pharmacist via telephone visit on (date/time).03/08/2024  Lenton Rail , RMA     [place]  [facility], [facility] Guide  Direct Dial: [phone]  Website: [email or web address]

## 2024-02-18 NOTE — Patient Instructions (Signed)
 Visit Information  Thank you for taking time to visit with me today. Please don't hesitate to contact me if I can be of assistance to you before our next scheduled telephone appointment.  Our next appointment is by telephone on 02/25/24 @ 215 pm  Following is a copy of your care plan:   Goals Addressed             This Visit's Progress    VBCI Transitions of Care (TOC) Care Plan       Problems: Recent Hospitalization for treatment of Diverticulitis and wound care, colostomy Home Health services barrier: [facility] home [facility] has not seen pt yet, will do first visit 02/14/24, Limited social support: patient's son lives 35 miles away, is supportive but cannot see pt daily for wound care, No Hospital Follow Up Provider appointment patient declines for RN Care Manager to schedule a post hospital follow up appointment citing he has too many other appointments now and will schedule later, and No Specialist appointment patient does know when his appointment is with surgeon, RN Care Manager made follow up appointment with Dr. Gertie Kub for 02/22/24 @ 430 pm, pt verbalizes understanding Patient lives alone, states he saw wound care and ostomy change (wafer) done in hospital but he needs teaching/ reinforcement in the home, pt is able to empty ostomy bag, states he will wait for home health RN on 6/1 for assistance and talk to his son and maybe can assist  02/18/24- pt states home health nurse is seeing him at least 3 x per week, changing dressing to abdominal wound and no longer daily/ BID, pt states HH RN in process of teaching, providing reinforcement to change ostomy wafer and care of peristomal skin, no new concerns reported  Goal:  Over the next 30 days, the patient will not experience hospital readmission  Interventions: Diverticulitis/ colostomy/ wound care Reinforced signs/ symptoms of infection, reportable signs /symptoms Reinforced care of ostomy, importance of emptying regularly before bag gets  too full, importance of changing immediately for any leakage, having son involved in changing wafer if needed and using a mirror to visualize,continue to work with home health Reviewed upcoming scheduled appointments Ask patient to call RN Care Manager for any questions, pt has contact number  Patient Self Care Activities:  Attend all scheduled provider appointments Call pharmacy for medication refills 3-7 days in advance of running out of medications Call provider office for new concerns or questions  Notify RN Care Manager of TOC call rescheduling needs Participate in Transition of Care Program/Attend TOC scheduled calls Perform all self care activities independently  Take medications as prescribed   Please work with home health RN on wound care and changing ostomy wafer Report any signs/ symptoms infection to your doctor See Dr. Atha Blanco (surgeon) on 6/9 @ 430 pm  Plan:  Telephone follow up appointment with care management team member scheduled for:  02/25/24 @ 215 pm The patient has been provided with contact information for the care management team and has been advised to call with any health related questions or concerns.         Patient verbalizes understanding of instructions and care plan provided today and agrees to view in MyChart. Active MyChart status and patient understanding of how to access instructions and care plan via MyChart confirmed with patient.     Telephone follow up appointment with care management team member scheduled for:  02/25/24 @ 215 pm  Please call the care guide team at [phone] if  you need to cancel or reschedule your appointment.   Please call the Suicide and Crisis Lifeline: 988 call the [place]  National Suicide Prevention Lifeline: 250-366-7942 or TTY: 435-606-7514 TTY (315)280-8155) to talk to a trained counselor call 1-800-273-TALK (toll free, 24 hour hotline) go to [facility] [address], [place]  [phone]) call the [facility] Crisis Line: (319)241-9252 call 911 if you are experiencing a Mental Health or Behavioral Health Crisis or need someone to talk to.  Cecilie Coffee [facility], BSN RN Care Manager/ Transition of Care [place]/ [facility] [phone]

## 2024-02-19 ENCOUNTER — Other Ambulatory Visit (HOSPITAL_COMMUNITY): Payer: Self-pay

## 2024-02-19 ENCOUNTER — Ambulatory Visit: Attending: Physician Assistant | Admitting: Physician Assistant

## 2024-02-19 ENCOUNTER — Encounter: Payer: Self-pay | Admitting: Physician Assistant

## 2024-02-19 ENCOUNTER — Telehealth: Payer: Self-pay | Admitting: Pharmacy Technician

## 2024-02-19 VITALS — BP 96/50 | HR 72 | Ht 66.0 in | Wt 132.0 lb

## 2024-02-19 DIAGNOSIS — Z955 Presence of coronary angioplasty implant and graft: Secondary | ICD-10-CM | POA: Diagnosis not present

## 2024-02-19 DIAGNOSIS — I5022 Chronic systolic (congestive) heart failure: Secondary | ICD-10-CM | POA: Diagnosis not present

## 2024-02-19 DIAGNOSIS — D630 Anemia in neoplastic disease: Secondary | ICD-10-CM | POA: Diagnosis not present

## 2024-02-19 DIAGNOSIS — E782 Mixed hyperlipidemia: Secondary | ICD-10-CM | POA: Diagnosis not present

## 2024-02-19 DIAGNOSIS — I252 Old myocardial infarction: Secondary | ICD-10-CM | POA: Diagnosis not present

## 2024-02-19 DIAGNOSIS — I5020 Unspecified systolic (congestive) heart failure: Secondary | ICD-10-CM | POA: Diagnosis not present

## 2024-02-19 DIAGNOSIS — C189 Malignant neoplasm of colon, unspecified: Secondary | ICD-10-CM | POA: Diagnosis not present

## 2024-02-19 DIAGNOSIS — Z433 Encounter for attention to colostomy: Secondary | ICD-10-CM | POA: Diagnosis not present

## 2024-02-19 DIAGNOSIS — C187 Malignant neoplasm of sigmoid colon: Secondary | ICD-10-CM | POA: Diagnosis not present

## 2024-02-19 DIAGNOSIS — Z7901 Long term (current) use of anticoagulants: Secondary | ICD-10-CM | POA: Diagnosis not present

## 2024-02-19 DIAGNOSIS — I2510 Atherosclerotic heart disease of native coronary artery without angina pectoris: Secondary | ICD-10-CM

## 2024-02-19 DIAGNOSIS — Z48815 Encounter for surgical aftercare following surgery on the digestive system: Secondary | ICD-10-CM | POA: Diagnosis not present

## 2024-02-19 DIAGNOSIS — K5790 Diverticulosis of intestine, part unspecified, without perforation or abscess without bleeding: Secondary | ICD-10-CM | POA: Diagnosis not present

## 2024-02-19 DIAGNOSIS — N529 Male erectile dysfunction, unspecified: Secondary | ICD-10-CM | POA: Diagnosis not present

## 2024-02-19 DIAGNOSIS — I513 Intracardiac thrombosis, not elsewhere classified: Secondary | ICD-10-CM | POA: Diagnosis not present

## 2024-02-19 DIAGNOSIS — I739 Peripheral vascular disease, unspecified: Secondary | ICD-10-CM | POA: Diagnosis not present

## 2024-02-19 DIAGNOSIS — I110 Hypertensive heart disease with heart failure: Secondary | ICD-10-CM | POA: Diagnosis not present

## 2024-02-19 DIAGNOSIS — Z87891 Personal history of nicotine dependence: Secondary | ICD-10-CM | POA: Diagnosis not present

## 2024-02-19 DIAGNOSIS — Z6823 Body mass index (BMI) 23.0-23.9, adult: Secondary | ICD-10-CM | POA: Diagnosis not present

## 2024-02-19 MED ORDER — ROSUVASTATIN CALCIUM 20 MG PO TABS
20.0000 mg | ORAL_TABLET | Freq: Every day | ORAL | 3 refills | Status: AC
Start: 2024-02-19 — End: ?

## 2024-02-19 NOTE — Telephone Encounter (Addendum)
 PAP: Patient assistance application for Eliquis  through [employer] has been mailed to pt's home address on file. Provider portion of application will be faxed to provider's office.   Blank provider portion uploaded into media

## 2024-02-19 NOTE — Assessment & Plan Note (Addendum)
 Peripheral arterial disease noted on CT scan during recent admission. No current symptoms of claudication reported.  He has evaluation with vascular surgery in the next several weeks.

## 2024-02-19 NOTE — Assessment & Plan Note (Signed)
 Hyperlipidemia managed with rosuvastatin . He mistakenly stopped rosuvastatin  post-discharge but can resume it. - Continue rosuvastatin  20 mg daily.

## 2024-02-19 NOTE — Patient Instructions (Addendum)
 Medication Instructions:  START BACK TAKING ROSUVASTATIN  CALCIUM  20 MG DAILY.   Lab Work: NONE   Testing/Procedures: NONE  Follow-Up: At [facility], you and your health needs are our priority.  As part of our continuing mission to provide you with exceptional heart care, our providers are all part of one team.  This team includes your primary Cardiologist (physician) and Advanced Practice Providers or APPs (Physician Assistants and Nurse Practitioners) who all work together to provide you with the care you need, when you need it.  Your next appointment:   6 WEEKS.  Provider:   Marlyse Single, PA  STEPHANIE HENNESSY WILL GIVE YOU A CALL SOME TIME NEXT WEEK WHEN SHE IS BACK IN THE OFFICE TO GET YOU SOME ASSISTANCE WITH ELIQUIS .

## 2024-02-19 NOTE — Assessment & Plan Note (Addendum)
 Sluggish flow on recent TTE with concerns for early forming thrombus. He was started on anticoagulation. He notes he will have difficulty affording Eliquis .  - Continue Eliquis  5 mg twice daily  - We will arrange assistance for Eliquis .

## 2024-02-19 NOTE — Assessment & Plan Note (Addendum)
 EF 25-30%. Ischemic cardiomyopathy. GDMT limited by hypotension. Echocardiogram during recent admission showed EF 25-30% with possible early forming thrombus. Blood pressure is currently too low to add further medications.  - Continue metoprolol  tartrate 12.5 mg twice daily. - Follow up in six weeks to assess potential for medication titration.  If blood pressure better, consider resuming ARB. - Depending upon prognosis related to his cancer, we can revisit +/- referral to EP for ICD

## 2024-02-19 NOTE — Assessment & Plan Note (Addendum)
 Coronary artery disease with prior stenting to RCA in 2002 and and subsequent PCI to LAD. Non-STEMI in October 2022 treated with DES x 2 to RCA. LAD stent patent at that time. No current chest symptoms suggestive of angina. - Continue rosuvastatin  20 mg daily. - He is no longer on aspirin  as he is on Eliquis .

## 2024-02-22 ENCOUNTER — Encounter: Payer: Self-pay | Admitting: *Deleted

## 2024-02-22 ENCOUNTER — Inpatient Hospital Stay: Attending: Oncology | Admitting: Oncology

## 2024-02-22 VITALS — BP 118/64 | HR 65 | Temp 98.2°F | Resp 18 | Ht 66.0 in | Wt 133.5 lb

## 2024-02-22 DIAGNOSIS — Z933 Colostomy status: Secondary | ICD-10-CM | POA: Insufficient documentation

## 2024-02-22 DIAGNOSIS — C187 Malignant neoplasm of sigmoid colon: Secondary | ICD-10-CM | POA: Diagnosis not present

## 2024-02-22 DIAGNOSIS — K6389 Other specified diseases of intestine: Secondary | ICD-10-CM | POA: Diagnosis not present

## 2024-02-22 DIAGNOSIS — E785 Hyperlipidemia, unspecified: Secondary | ICD-10-CM | POA: Insufficient documentation

## 2024-02-22 DIAGNOSIS — J439 Emphysema, unspecified: Secondary | ICD-10-CM | POA: Insufficient documentation

## 2024-02-22 DIAGNOSIS — I110 Hypertensive heart disease with heart failure: Secondary | ICD-10-CM | POA: Insufficient documentation

## 2024-02-22 DIAGNOSIS — R918 Other nonspecific abnormal finding of lung field: Secondary | ICD-10-CM | POA: Insufficient documentation

## 2024-02-22 DIAGNOSIS — I2510 Atherosclerotic heart disease of native coronary artery without angina pectoris: Secondary | ICD-10-CM | POA: Insufficient documentation

## 2024-02-22 NOTE — Progress Notes (Signed)
 [facility] New Patient Consult   Requesting MD: Galvin Jules, Fnp [address] [place],  [place] [address]   [name] [name]. 78 y.o.  20-Jul-1946    Reason for Consult: Colon cancer   HPI: [name] [name] reports a 1 month history of rectal urgency prior to presenting to the emergency room with lower abdominal pain 01/26/2024.  CTs revealed segmental thickening in the mid sigmoid with an associated contained perforation.  He was felt to have perforated diverticulitis with an abscess.  Interventional radiology had no window for drain placement.  He was placed on IV antibiotics and the surgical service was consulted. He was taken to the operating room by Dr. Davonna Estes on 02/01/2024 for an exploratory laparotomy and sigmoidectomy.  The mass was noted in the distal descending colon with no evidence of intraperitoneal these beyond the colon.  An inflammatory process was noted in the distal sigmoid and upper rectal region.  A mass was noted in the bowel.  A frozen section was positive for adenocarcinoma.  There was remaining abnormal bowel after the colon was transected.  The area was too low for a stapler and remaining abnormal bowel was excised with scissors.  The rectum was oversewn and a colostomy was created.  He was discharged to home 02/09/2024.  The colostomy is functioning well.  He is scheduled to see Dr. Davonna Estes later today.  He remains weak.  Past Medical History:  Diagnosis Date   Coronary artery disease    Erectile dysfunction    HFrEF (heart failure with reduced ejection fraction) (HCC)    Echocardiogram 07/14/21:  EF 25, global HK, inf HK worse, Gr 2 DD, normal RVSF, mild to mod MR, AV sclerosis w/o AS Echocardiogram 2/23: EF 25-30, global HK, GLS -12.9, normal RVSF, mild to mod MR, AV sclerosis w/o AS    History of hiatal hernia    a lone time ago   Hyperlipidemia    [place] fracture [facility]) 03/22/2020   Myocardial infarction (HCC)    x 2   Polio     Past  Surgical History:  Procedure Laterality Date   APPENDECTOMY     CARDIAC CATHETERIZATION     has two stents placed   CERVICAL SPINE SURGERY     c3-4, 4-5,  5-6, 8 screws and plates   COLOSTOMY Left 02/01/2024   Procedure: CREATION, COLOSTOMY;  Surgeon: Cannon Champion, MD;  Location: [facility] OR;  Service: General;  Laterality: Left;   CORONARY STENT INTERVENTION N/A 07/15/2021   Procedure: CORONARY STENT INTERVENTION;  Surgeon: Avanell Leigh, MD;  Location: [facility] INVASIVE CV LAB;  Service: Cardiovascular;  Laterality: N/A;   CORONARY STENT PLACEMENT     forein body removal     metal sharp removed from right leg   LAPAROTOMY N/A 02/01/2024   Procedure: LAPAROTOMY, EXPLORATORY;  Surgeon: Cannon Champion, MD;  Location: [facility] OR;  Service: General;  Laterality: N/A;  POSSIBLE OSTOMY   LEFT HEART CATH AND CORONARY ANGIOGRAPHY N/A 11/15/2018   Procedure: LEFT HEART CATH AND CORONARY ANGIOGRAPHY;  Surgeon: Sammy Crisp, MD;  Location: [facility];  Service: Cardiovascular;  Laterality: N/A;   LEFT HEART CATH AND CORONARY ANGIOGRAPHY N/A 07/15/2021   Procedure: LEFT HEART CATH AND CORONARY ANGIOGRAPHY;  Surgeon: Avanell Leigh, MD;  Location: [facility];  Service: Cardiovascular;  Laterality: N/A;   MANDIBULAR HARDWARE REMOVAL Bilateral 04/23/2020   Procedure: MANDIBULAR HARDWARE REMOVAL;  Surgeon: Janita Mellow, MD;  Location: [facility];  Service: ENT;  Laterality: Bilateral;   ORIF MANDIBULAR FRACTURE N/A 03/14/2020   Procedure: OPEN REDUCTION INTERNAL FIXATION (ORIF) MID  FACE FRACTURE, MANDIBULAR FIXATION MODIFIED ARCH BARS;  Surgeon: Janita Mellow, MD;  Location: [facility] OR;  Service: ENT;  Laterality: N/A;   PARTIAL COLECTOMY N/A 02/01/2024   Procedure: COLECTOMY, PARTIAL;  Surgeon: Cannon Champion, MD;  Location: [facility] OR;  Service: General;  Laterality: N/A;  sigmoid colon   SCAR REVISION N/A 04/23/2020   Procedure: SCAR REVISION/ Upper Lip Repair;  Surgeon: Janita Mellow, MD;  Location: [facility];  Service: ENT;  Laterality: N/A;    Medications: Reviewed  Allergies:  Allergies  Allergen Reactions   Carvedilol  Diarrhea   Lipitor [Atorvastatin] Other (See Comments)    "Makes pt feel funky"   Plavix  [Clopidogrel ] Rash    Family history: No family history of cancer  Social History:   He lives alone in [place].  He is retired from working as a Psychologist, forensic.  He quit smoking cigarettes 15 years ago.  He drinks 2 beers per day.  No transfusion history.  ROS:   Positives include: Rectal urgency for 1 month prior to hospital admission, acute abdominal pain prior to hospital admission 01/26/2024  A complete ROS was otherwise negative.  Physical Exam:  Blood pressure 118/64, pulse 65, temperature 98.2 F (36.8 C), temperature source Temporal, resp. rate 18, height 5\' 6"  (1.676 m), weight 133 lb 8 oz (60.6 kg), SpO2 97%.  HEENT: Upper and lower denture plate, oral cavity without visible mass Lungs: Scattered expiratory rhonchi, no respiratory distress Cardiac: Regular rate and rhythm Abdomen: No hepatosplenomegaly, no mass, healing midline incision with staples in place, left abdomen colostomy with brown stool GU: Testes without mass Vascular: No leg edema Lymph nodes: No cervical, supraclavicular, axillary, or inguinal nodes Neurologic: Alert and oriented, weakness of the right leg Skin: No rash Musculoskeletal: Atrophy of the right thigh musculature   LAB:  CBC  Lab Results  Component Value Date   WBC 8.1 02/09/2024   HGB 12.7 (L) 02/09/2024   HCT 38.3 (L) 02/09/2024   MCV 86.8 02/09/2024   PLT 406 (H) 02/09/2024   NEUTROABS 6.2 01/08/2024        CMP  Lab Results  Component Value Date   NA 136 02/09/2024   K 4.6 02/09/2024   CL 108 02/09/2024   CO2 23 02/09/2024   GLUCOSE 97 02/09/2024   BUN 24 (H) 02/09/2024   CREATININE 0.84 02/09/2024   CALCIUM  8.5 (L) 02/09/2024   PROT 5.5 (L) 02/03/2024    ALBUMIN 2.3 (L) 02/03/2024   AST 23 02/03/2024   ALT 16 02/03/2024   ALKPHOS 47 02/03/2024   BILITOT 0.5 02/03/2024   GFRNONAA >60 02/09/2024   GFRAA 104 05/01/2020     Lab Results  Component Value Date   CEA1 3.3 02/02/2024    Imaging:  As per HPI, CT images 01/26/2024 and 02/02/2024 reviewed with Mr. 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), status post a sigmoidectomy and end colostomy 02/01/2024 0/11 nodes, positive as enteric and distal margin, macroscopic tumor perforation-carcinoma involves distal disrupted segment CT angio chest/abdomen/pelvis 01/26/2024: Severe emphysema, segmental circumferential sigmoid thickening with associated contained perforation/abscess no adenopathy CT abdomen/pelvis 01/29/2024: Persistent perforated sigmoid diverticulitis with 2 adjacent fluid collections, 1 containing gas CT chest 02/02/2024: Tiny bilateral noncalcified pulmonary nodules nonspecific severe emphysema COPD CAD CHF, possible early  LV thrombus on echocardiogram 01/28/2024, LVEF 25-30%-apixaban  Hypertension Hyperlipidemia Pulmonary nodules-nonspecific pulmonary nodules on CT 01/26/2024 and 02/02/2024    Disposition:   Mr. Shadwick has been diagnosed with stage II colon cancer.  There is no clinical evidence of metastatic disease.  The pulmonary nodules are most likely benign.  He has high risk stage II colon cancer based on the clinical presentation of a perforated tumor and the pathologic T stage with a positive surgical margin.  Mr. Delvecchio has multiple comorbid conditions.  I recommend adjuvant systemic therapy.  We will most likely recommend capecitabine.  I will present his case at the GI tumor conference to discuss options for management of the surgical margin.  He may be a candidate for additional colon resection at the time of ostomy takedown.  We will submit peripheral blood for baseline circulating tumor DNA testing.  Mr. Sleight does  not appear to have hereditary nonpolyposis colon cancer syndrome, but his family members are at increased risk of developing colorectal cancer and should receive appropriate screening.  He will return for an office visit and further discussion on 03/07/2024.  Coni Deep, MD  02/22/2024, 2:23 PM

## 2024-02-22 NOTE — Progress Notes (Signed)
 PATIENT NAVIGATOR PROGRESS NOTE  Name: [name] [name]. Date: 02/22/2024 MRN: [id number]  DOB: [birth year]   Reason for visit:  New patient appt  Comments:  Met with [name] [name] today during appt with Dr Scherrie Curt  Referral to SW Referral to Nutrition Will be presented at GI conference 6/18 Given information on Capecitabine and contact information to call with any questions Referral to GI for colonoscopy Guardant reveal will be drawn at next visit    Time spent counseling/coordinating care: > 60 minutes

## 2024-02-23 DIAGNOSIS — Z87891 Personal history of nicotine dependence: Secondary | ICD-10-CM | POA: Diagnosis not present

## 2024-02-23 DIAGNOSIS — Z433 Encounter for attention to colostomy: Secondary | ICD-10-CM | POA: Diagnosis not present

## 2024-02-23 DIAGNOSIS — Z7901 Long term (current) use of anticoagulants: Secondary | ICD-10-CM | POA: Diagnosis not present

## 2024-02-23 DIAGNOSIS — Z955 Presence of coronary angioplasty implant and graft: Secondary | ICD-10-CM | POA: Diagnosis not present

## 2024-02-23 DIAGNOSIS — Z6823 Body mass index (BMI) 23.0-23.9, adult: Secondary | ICD-10-CM | POA: Diagnosis not present

## 2024-02-23 DIAGNOSIS — E782 Mixed hyperlipidemia: Secondary | ICD-10-CM | POA: Diagnosis not present

## 2024-02-23 DIAGNOSIS — Z48815 Encounter for surgical aftercare following surgery on the digestive system: Secondary | ICD-10-CM | POA: Diagnosis not present

## 2024-02-23 DIAGNOSIS — C189 Malignant neoplasm of colon, unspecified: Secondary | ICD-10-CM | POA: Diagnosis not present

## 2024-02-23 DIAGNOSIS — D630 Anemia in neoplastic disease: Secondary | ICD-10-CM | POA: Diagnosis not present

## 2024-02-23 DIAGNOSIS — I110 Hypertensive heart disease with heart failure: Secondary | ICD-10-CM | POA: Diagnosis not present

## 2024-02-23 DIAGNOSIS — N529 Male erectile dysfunction, unspecified: Secondary | ICD-10-CM | POA: Diagnosis not present

## 2024-02-23 DIAGNOSIS — I739 Peripheral vascular disease, unspecified: Secondary | ICD-10-CM | POA: Diagnosis not present

## 2024-02-23 DIAGNOSIS — I252 Old myocardial infarction: Secondary | ICD-10-CM | POA: Diagnosis not present

## 2024-02-23 DIAGNOSIS — K5790 Diverticulosis of intestine, part unspecified, without perforation or abscess without bleeding: Secondary | ICD-10-CM | POA: Diagnosis not present

## 2024-02-23 DIAGNOSIS — I5022 Chronic systolic (congestive) heart failure: Secondary | ICD-10-CM | POA: Diagnosis not present

## 2024-02-23 DIAGNOSIS — I2510 Atherosclerotic heart disease of native coronary artery without angina pectoris: Secondary | ICD-10-CM | POA: Diagnosis not present

## 2024-02-24 ENCOUNTER — Ambulatory Visit (INDEPENDENT_AMBULATORY_CARE_PROVIDER_SITE_OTHER): Admitting: Family Medicine

## 2024-02-24 ENCOUNTER — Encounter: Payer: Self-pay | Admitting: Family Medicine

## 2024-02-24 VITALS — BP 134/69 | HR 68 | Temp 96.0°F | Ht 66.0 in | Wt 134.6 lb

## 2024-02-24 DIAGNOSIS — I739 Peripheral vascular disease, unspecified: Secondary | ICD-10-CM | POA: Diagnosis not present

## 2024-02-24 DIAGNOSIS — Z09 Encounter for follow-up examination after completed treatment for conditions other than malignant neoplasm: Secondary | ICD-10-CM | POA: Diagnosis not present

## 2024-02-24 DIAGNOSIS — I2510 Atherosclerotic heart disease of native coronary artery without angina pectoris: Secondary | ICD-10-CM

## 2024-02-24 DIAGNOSIS — E559 Vitamin D deficiency, unspecified: Secondary | ICD-10-CM | POA: Diagnosis not present

## 2024-02-24 DIAGNOSIS — R634 Abnormal weight loss: Secondary | ICD-10-CM

## 2024-02-24 DIAGNOSIS — C187 Malignant neoplasm of sigmoid colon: Secondary | ICD-10-CM | POA: Diagnosis not present

## 2024-02-24 DIAGNOSIS — K5792 Diverticulitis of intestine, part unspecified, without perforation or abscess without bleeding: Secondary | ICD-10-CM | POA: Diagnosis not present

## 2024-02-24 DIAGNOSIS — E782 Mixed hyperlipidemia: Secondary | ICD-10-CM

## 2024-02-24 DIAGNOSIS — Z933 Colostomy status: Secondary | ICD-10-CM

## 2024-02-24 LAB — LIPID PANEL

## 2024-02-24 NOTE — Progress Notes (Signed)
 Subjective:  Patient ID: [name], male    DOB: 06/19/1946, 77 y.o.   MRN: [id number]  Patient Care Team: Galvin Jules, FNP as PCP - General (Family Medicine) Thukkani, Arun K, MD as PCP - Cardiology (Cardiology) Daralyn Earl, RN as [facility] Care Management Sherwood Donath as Physician Assistant (Cardiology)   Chief Complaint:  Hospitalization Follow-up (01/26/2024 - 02/09/2024 (14 days)/[facility]- Acute diverticulitis)   HPI: Redmond Whittley. is a 78 y.o. male presenting on 02/24/2024 for Hospitalization Follow-up (01/26/2024 - 02/09/2024 (14 days)/[facility]- Acute diverticulitis)   Seaton Hofmann. 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  He was hospitalized from May 13 to May 27 due to gastrointestinal pain, during which he was diagnosed with sigmoid colon cancer and underwent surgical resection of a 6 cm section of the colon. 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  He is set to begin chemotherapy with capecitabine (Xeloda) twice daily for two weeks on, one week off, for eight cycles. He is scheduled for blood work on June 23 to monitor treatment response. He is concerned about the dosage and its effects, referencing a friend's experience with high-dose chemotherapy.  He has experienced significant weight loss but maintains a good appetite, eating 'like a horse.' He experienced discomfort after eating a hamburger, indicating dietary caution is needed. He consumes three Boost nutritional drinks daily and tolerates fish well, inquiring about its safety.  He has a history of heart disease and recently had an echocardiogram in May. 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 He has a history of smoking a pack a day but quit long ago. He also has a history of diverticulitis,  related to his current condition.  He receives home health care from [facility] for colostomy management due to limited neck mobility from a previous injury. He empties the colostomy bag four to five times a day and is considering continuing home health visits once a week for assistance with changing the colostomy bag.         Relevant past medical, surgical, family, and social history reviewed and updated as indicated.  Allergies and medications reviewed and updated. Data reviewed: Chart in Epic.   Past Medical History:  Diagnosis Date   Coronary artery disease    Erectile dysfunction    HFrEF (heart failure with reduced ejection fraction) (HCC)    Echocardiogram 07/14/21:  EF 25, global HK, inf HK worse, Gr 2 DD, normal RVSF, mild to mod MR, AV sclerosis w/o AS Echocardiogram 2/23: EF 25-30, global HK, GLS -12.9, normal RVSF, mild to mod MR, AV sclerosis w/o AS    History of hiatal hernia    a lone time ago   Hyperlipidemia    [place] fracture [employer]) 03/22/2020   Myocardial infarction (HCC)    x 2   [place]     Past Surgical History:  Procedure Laterality Date   APPENDECTOMY     CARDIAC CATHETERIZATION     has two stents placed   CERVICAL SPINE SURGERY     c3-4, 4-5,  5-6, 8 screws and plates   COLOSTOMY Left 02/01/2024   Procedure: CREATION, COLOSTOMY;  Surgeon: Cannon Champion, MD;  Location: [facility] OR;  Service: General;  Laterality: Left;   CORONARY STENT INTERVENTION N/A 07/15/2021  Procedure: CORONARY STENT INTERVENTION;  Surgeon: Avanell Leigh, MD;  Location: [facility] INVASIVE CV LAB;  Service: Cardiovascular;  Laterality: N/A;   CORONARY STENT PLACEMENT     forein body removal     metal sharp removed from right leg   LAPAROTOMY N/A 02/01/2024   Procedure: LAPAROTOMY, EXPLORATORY;  Surgeon: Cannon Champion, MD;  Location: [facility] OR;  Service: General;  Laterality: N/A;  POSSIBLE OSTOMY   LEFT HEART CATH AND CORONARY ANGIOGRAPHY N/A 11/15/2018   Procedure: LEFT HEART CATH AND  CORONARY ANGIOGRAPHY;  Surgeon: Sammy Crisp, MD;  Location: [facility] INVASIVE CV LAB;  Service: Cardiovascular;  Laterality: N/A;   LEFT HEART CATH AND CORONARY ANGIOGRAPHY N/A 07/15/2021   Procedure: LEFT HEART CATH AND CORONARY ANGIOGRAPHY;  Surgeon: Avanell Leigh, MD;  Location: [facility] INVASIVE CV LAB;  Service: Cardiovascular;  Laterality: N/A;   MANDIBULAR HARDWARE REMOVAL Bilateral 04/23/2020   Procedure: MANDIBULAR HARDWARE REMOVAL;  Surgeon: Janita Mellow, MD;  Location: [facility];  Service: ENT;  Laterality: Bilateral;   ORIF MANDIBULAR FRACTURE N/A 03/14/2020   Procedure: OPEN REDUCTION INTERNAL FIXATION (ORIF) MID  FACE FRACTURE, MANDIBULAR FIXATION MODIFIED ARCH BARS;  Surgeon: Janita Mellow, MD;  Location: [facility] OR;  Service: ENT;  Laterality: N/A;   PARTIAL COLECTOMY N/A 02/01/2024   Procedure: COLECTOMY, PARTIAL;  Surgeon: Cannon Champion, MD;  Location: [facility] OR;  Service: General;  Laterality: N/A;  sigmoid colon   SCAR REVISION N/A 04/23/2020   Procedure: SCAR REVISION/ Upper Lip Repair;  Surgeon: Janita Mellow, MD;  Location:  SURGERY CENTER;  Service: ENT;  Laterality: N/A;    Social History   Socioeconomic History   Marital status: Divorced    Spouse name: Not on file   Number of children: 2   Years of education: 12   Highest education level: 12th grade  Occupational History   Occupation: retired    Comment: farmer  Tobacco Use   Smoking status: Former    Types: Cigarettes    Passive exposure: Past   Smokeless tobacco: Never  Vaping Use   Vaping status: Never Used  Substance and Sexual Activity   Alcohol use: Yes    Alcohol/week: 21.0 standard drinks of alcohol    Types: 21 Cans of beer per week    Comment: 2 beers daily   Drug use: No   Sexual activity: Not on file  Other Topics Concern   Not on file  Social History Narrative   Mr. Cyr has a restraining order against one of his sons.   Social Drivers of Corporate investment banker  Strain: Low Risk  (02/16/2024)   Overall Financial Resource Strain (CARDIA)    Difficulty of Paying Living Expenses: Not hard at all  Food Insecurity: No Food Insecurity (02/22/2024)   Hunger Vital Sign    Worried About Running Out of Food in the Last Year: Never true    Ran Out of Food in the Last Year: Never true  Transportation Needs: No Transportation Needs (02/22/2024)   PRAPARE - Administrator, Civil Service (Medical): No    Lack of Transportation (Non-Medical): No  Physical Activity: Unknown (02/16/2024)   Exercise Vital Sign    Days of Exercise per Week: 0 days    Minutes of Exercise per Session: Not on file  Stress: Stress Concern Present (02/22/2024)   [employer] of Occupational Health - Occupational Stress Questionnaire    Feeling of Stress : To some extent  Social  Connections: Moderately Integrated (02/16/2024)   Social Connection and Isolation Panel [NHANES]    Frequency of Communication with Friends and Family: More than three times a week    Frequency of Social Gatherings with Friends and Family: More than three times a week    Attends Religious Services: More than 4 times per year    Active Member of [employer] or Organizations: Yes    Attends Engineer, structural: More than 4 times per year    Marital Status: Divorced  Intimate Partner Violence: Not At Risk (02/22/2024)   Humiliation, Afraid, Rape, and Kick questionnaire    Fear of Current or Ex-Partner: No    Emotionally Abused: No    Physically Abused: No    Sexually Abused: No    Outpatient Encounter Medications as of 02/24/2024  Medication Sig   albuterol  (VENTOLIN  HFA) 108 (90 Base) MCG/ACT inhaler Inhale 2 puffs into the lungs every 6 (six) hours as needed for wheezing (cough).   apixaban  (ELIQUIS ) 5 MG TABS tablet Take 1 tablet (5 mg total) by mouth 2 (two) times daily.   cholecalciferol  (VITAMIN D3) 25 MCG (1000 UNIT) tablet Take 1,000 Units by mouth daily.    ketoconazole (NIZORAL) 2 % cream  Apply 1 Application topically 2 (two) times daily.   metoprolol  tartrate (LOPRESSOR ) 25 MG tablet Take 0.5 tablets (12.5 mg total) by mouth 2 (two) times daily.   nitroGLYCERIN  (NITROSTAT ) 0.4 MG SL tablet Place 1 tablet (0.4 mg total) under the tongue every 5 (five) minutes as needed for chest pain.   oxyCODONE  (OXY IR/ROXICODONE ) 5 MG immediate release tablet Take 1-2 tablets (5-10 mg total) by mouth every 6 (six) hours as needed for severe pain (pain score 7-10).   polyethylene glycol powder (GLYCOLAX /MIRALAX ) 17 GM/SCOOP powder Take 17 g by mouth daily.   rosuvastatin  (CRESTOR ) 20 MG tablet Take 1 tablet (20 mg total) by mouth daily.   No facility-administered encounter medications on file as of 02/24/2024.    Allergies  Allergen Reactions   Carvedilol  Diarrhea   Lipitor [Atorvastatin] Other (See Comments)    Makes pt feel funky   Plavix  [Clopidogrel ] Rash    Pertinent ROS per HPI, otherwise unremarkable      Objective:  BP 134/69   Pulse 68   Temp (!) 96 F (35.6 C)   Ht 5' 6 (1.676 m)   Wt 134 lb 9.6 oz (61.1 kg)   SpO2 99%   BMI 21.73 kg/m    Wt Readings from Last 3 Encounters:  02/24/24 134 lb 9.6 oz (61.1 kg)  02/22/24 133 lb 8 oz (60.6 kg)  02/19/24 132 lb (59.9 kg)    Physical Exam Vitals and nursing note reviewed.  Constitutional:      General: He is not in acute distress.    Appearance: Normal appearance. He is not ill-appearing, toxic-appearing or diaphoretic.  HENT:     Head: Normocephalic and atraumatic.     Nose: Nose normal.     Mouth/Throat:     Mouth: Mucous membranes are moist.  Eyes:     Conjunctiva/sclera: Conjunctivae normal.     Pupils: Pupils are equal, round, and reactive to light.  Cardiovascular:     Rate and Rhythm: Normal rate and regular rhythm.     Heart sounds: Normal heart sounds.  Pulmonary:     Effort: Pulmonary effort is normal.     Breath sounds: Normal breath sounds.  Abdominal:     General: Abdomen is flat.  Comments: Surgical wound well healed  Musculoskeletal:     Cervical back: Neck supple.     Right lower leg: No edema.     Left lower leg: No edema.  Skin:    General: Skin is warm and dry.     Capillary Refill: Capillary refill takes less than 2 seconds.  Neurological:     General: No focal deficit present.     Mental Status: He is alert and oriented to person, place, and time.  Psychiatric:        Mood and Affect: Mood normal.        Behavior: Behavior normal.        Thought Content: Thought content normal.        Judgment: Judgment normal.     Results for orders placed or performed during the hospital encounter of 01/26/24  CBC   Collection Time: 01/26/24 12:47 PM  Result Value Ref Range   WBC 13.5 (H) 4.0 - 10.5 K/uL   RBC 4.94 4.22 - 5.81 MIL/uL   Hemoglobin 14.3 13.0 - 17.0 g/dL   HCT 29.5 62.1 - 30.8 %   MCV 85.8 80.0 - 100.0 fL   MCH 28.9 26.0 - 34.0 pg   MCHC 33.7 30.0 - 36.0 g/dL   RDW 65.7 84.6 - 96.2 %   Platelets 242 150 - 400 K/uL   nRBC 0.0 0.0 - 0.2 %  Comprehensive metabolic panel   Collection Time: 01/26/24 12:47 PM  Result Value Ref Range   Sodium 131 (L) 135 - 145 mmol/L   Potassium 3.9 3.5 - 5.1 mmol/L   Chloride 98 98 - 111 mmol/L   CO2 25 22 - 32 mmol/L   Glucose, Bld 97 70 - 99 mg/dL   BUN 17 8 - 23 mg/dL   Creatinine, Ser 9.52 0.61 - 1.24 mg/dL   Calcium  8.6 (L) 8.9 - 10.3 mg/dL   Total Protein 6.3 (L) 6.5 - 8.1 g/dL   Albumin 3.0 (L) 3.5 - 5.0 g/dL   AST 18 15 - 41 U/L   ALT 17 0 - 44 U/L   Alkaline Phosphatase 60 38 - 126 U/L   Total Bilirubin 1.3 (H) 0.0 - 1.2 mg/dL   GFR, Estimated >84 >13 mL/min   Anion gap 8 5 - 15  Lipase, blood   Collection Time: 01/26/24 12:47 PM  Result Value Ref Range   Lipase 28 11 - 51 U/L  Troponin I (High Sensitivity)   Collection Time: 01/26/24 12:47 PM  Result Value Ref Range   Troponin I (High Sensitivity) 13 <18 ng/L  Urinalysis, Routine w reflex microscopic -Urine, Clean Catch   Collection Time:  01/26/24  1:09 PM  Result Value Ref Range   Color, Urine YELLOW YELLOW   APPearance CLEAR CLEAR   Specific Gravity, Urine >1.046 (H) 1.005 - 1.030   pH 6.0 5.0 - 8.0   Glucose, UA NEGATIVE NEGATIVE mg/dL   Hgb urine dipstick NEGATIVE NEGATIVE   Bilirubin Urine NEGATIVE NEGATIVE   Ketones, ur 20 (A) NEGATIVE mg/dL   Protein, ur NEGATIVE NEGATIVE mg/dL   Nitrite NEGATIVE NEGATIVE   Leukocytes,Ua NEGATIVE NEGATIVE  Troponin I (High Sensitivity)   Collection Time: 01/26/24  2:47 PM  Result Value Ref Range   Troponin I (High Sensitivity) 13 <18 ng/L  Culture, blood (routine x 2)   Collection Time: 01/26/24  3:15 PM   Specimen: BLOOD RIGHT FOREARM  Result Value Ref Range   Specimen Description BLOOD RIGHT FOREARM  Special Requests      BOTTLES DRAWN AEROBIC AND ANAEROBIC Blood Culture adequate volume   Culture      NO GROWTH 5 DAYS Performed at [facility], [address], [place], [place] [address]    Report Status 01/31/2024 FINAL   Culture, blood (routine x 2)   Collection Time: 01/26/24  3:35 PM   Specimen: BLOOD RIGHT WRIST  Result Value Ref Range   Specimen Description BLOOD RIGHT WRIST    Special Requests      BOTTLES DRAWN AEROBIC AND ANAEROBIC Blood Culture adequate volume   Culture      NO GROWTH 5 DAYS Performed at [facility], [address], [place], [place] [address]    Report Status 01/31/2024 FINAL   I-Stat Lactic Acid   Collection Time: 01/26/24  3:47 PM  Result Value Ref Range   Lactic Acid, Venous 1.1 0.5 - 1.9 mmol/L  Comprehensive metabolic panel   Collection Time: 01/27/24  6:41 AM  Result Value Ref Range   Sodium 130 (L) 135 - 145 mmol/L   Potassium 4.0 3.5 - 5.1 mmol/L   Chloride 100 98 - 111 mmol/L   CO2 18 (L) 22 - 32 mmol/L   Glucose, Bld 71 70 - 99 mg/dL   BUN 17 8 - 23 mg/dL   Creatinine, Ser 8.29 0.61 - 1.24 mg/dL   Calcium  8.5 (L) 8.9 - 10.3 mg/dL   Total Protein 6.9 6.5 - 8.1 g/dL   Albumin 2.8 (L) 3.5 - 5.0 g/dL    AST 19 15 - 41 U/L   ALT 15 0 - 44 U/L   Alkaline Phosphatase 56 38 - 126 U/L   Total Bilirubin 1.6 (H) 0.0 - 1.2 mg/dL   GFR, Estimated >56 >21 mL/min   Anion gap 12 5 - 15  CBC   Collection Time: 01/27/24  6:41 AM  Result Value Ref Range   WBC 14.9 (H) 4.0 - 10.5 K/uL   RBC 4.81 4.22 - 5.81 MIL/uL   Hemoglobin 13.7 13.0 - 17.0 g/dL   HCT 30.8 65.7 - 84.6 %   MCV 85.7 80.0 - 100.0 fL   MCH 28.5 26.0 - 34.0 pg   MCHC 33.3 30.0 - 36.0 g/dL   RDW 96.2 95.2 - 84.1 %   Platelets 275 150 - 400 K/uL   nRBC 0.0 0.0 - 0.2 %  CBC   Collection Time: 01/28/24  7:35 AM  Result Value Ref Range   WBC 14.1 (H) 4.0 - 10.5 K/uL   RBC 4.42 4.22 - 5.81 MIL/uL   Hemoglobin 13.1 13.0 - 17.0 g/dL   HCT 32.4 (L) 40.1 - 02.7 %   MCV 84.6 80.0 - 100.0 fL   MCH 29.6 26.0 - 34.0 pg   MCHC 35.0 30.0 - 36.0 g/dL   RDW 25.3 66.4 - 40.3 %   Platelets 263 150 - 400 K/uL   nRBC 0.0 0.0 - 0.2 %  Comprehensive metabolic panel   Collection Time: 01/28/24  7:35 AM  Result Value Ref Range   Sodium 133 (L) 135 - 145 mmol/L   Potassium 4.1 3.5 - 5.1 mmol/L   Chloride 101 98 - 111 mmol/L   CO2 22 22 - 32 mmol/L   Glucose, Bld 91 70 - 99 mg/dL   BUN 16 8 - 23 mg/dL   Creatinine, Ser 4.74 0.61 - 1.24 mg/dL   Calcium  8.2 (L) 8.9 - 10.3 mg/dL   Total Protein 5.5 (L) 6.5 -  8.1 g/dL   Albumin 2.5 (L) 3.5 - 5.0 g/dL   AST 17 15 - 41 U/L   ALT 15 0 - 44 U/L   Alkaline Phosphatase 56 38 - 126 U/L   Total Bilirubin 1.2 0.0 - 1.2 mg/dL   GFR, Estimated >16 >10 mL/min   Anion gap 10 5 - 15  ECHOCARDIOGRAM COMPLETE   Collection Time: 01/28/24  9:22 AM  Result Value Ref Range   Weight 2,368 oz   Height 66.5 in   BP 112/74 mmHg   Single Plane A2C EF 44.9 %   Single Plane A4C EF 36.0 %   Calc EF 39.3 %   S' Lateral 4.30 cm   AR max vel 3.15 cm2   AV Area VTI 2.91 cm2   AV Mean grad 2.0 mmHg   AV Peak grad 4.1 mmHg   Ao pk vel 1.01 m/s   AV Area mean vel 3.06 cm2   Est EF 25 - 30%   Heparin  level  (unfractionated)   Collection Time: 01/28/24 11:03 PM  Result Value Ref Range   Heparin  Unfractionated <0.10 (L) 0.30 - 0.70 IU/mL  CBC   Collection Time: 01/29/24 10:32 AM  Result Value Ref Range   WBC 14.7 (H) 4.0 - 10.5 K/uL   RBC 4.64 4.22 - 5.81 MIL/uL   Hemoglobin 13.4 13.0 - 17.0 g/dL   HCT 96.0 45.4 - 09.8 %   MCV 85.6 80.0 - 100.0 fL   MCH 28.9 26.0 - 34.0 pg   MCHC 33.8 30.0 - 36.0 g/dL   RDW 11.9 14.7 - 82.9 %   Platelets 318 150 - 400 K/uL   nRBC 0.0 0.0 - 0.2 %  Heparin  level (unfractionated)   Collection Time: 01/29/24 10:32 AM  Result Value Ref Range   Heparin  Unfractionated <0.10 (L) 0.30 - 0.70 IU/mL  Heparin  level (unfractionated)   Collection Time: 01/29/24  9:44 PM  Result Value Ref Range   Heparin  Unfractionated 0.23 (L) 0.30 - 0.70 IU/mL  CBC   Collection Time: 01/30/24  5:06 AM  Result Value Ref Range   WBC 11.7 (H) 4.0 - 10.5 K/uL   RBC 4.14 (L) 4.22 - 5.81 MIL/uL   Hemoglobin 12.0 (L) 13.0 - 17.0 g/dL   HCT 56.2 (L) 13.0 - 86.5 %   MCV 84.3 80.0 - 100.0 fL   MCH 29.0 26.0 - 34.0 pg   MCHC 34.4 30.0 - 36.0 g/dL   RDW 78.4 69.6 - 29.5 %   Platelets 198 150 - 400 K/uL   nRBC 0.0 0.0 - 0.2 %  Heparin  level (unfractionated)   Collection Time: 01/30/24  5:06 AM  Result Value Ref Range   Heparin  Unfractionated 0.29 (L) 0.30 - 0.70 IU/mL  Basic metabolic panel   Collection Time: 01/30/24  5:06 AM  Result Value Ref Range   Sodium 129 (L) 135 - 145 mmol/L   Potassium 4.0 3.5 - 5.1 mmol/L   Chloride 98 98 - 111 mmol/L   CO2 19 (L) 22 - 32 mmol/L   Glucose, Bld 86 70 - 99 mg/dL   BUN 15 8 - 23 mg/dL   Creatinine, Ser 2.84 0.61 - 1.24 mg/dL   Calcium  8.3 (L) 8.9 - 10.3 mg/dL   GFR, Estimated >13 >24 mL/min   Anion gap 12 5 - 15  CBC   Collection Time: 01/30/24  7:18 PM  Result Value Ref Range   WBC 11.8 (H) 4.0 - 10.5 K/uL   RBC 3.89 (  L) 4.22 - 5.81 MIL/uL   Hemoglobin 11.2 (L) 13.0 - 17.0 g/dL   HCT 09.8 (L) 11.9 - 14.7 %   MCV 83.8 80.0 -  100.0 fL   MCH 28.8 26.0 - 34.0 pg   MCHC 34.4 30.0 - 36.0 g/dL   RDW 82.9 56.2 - 13.0 %   Platelets 255 150 - 400 K/uL   nRBC 0.0 0.0 - 0.2 %  Type and screen [facility]   Collection Time: 01/31/24 10:22 AM  Result Value Ref Range   ABO/RH(D) A POS    Antibody Screen NEG    Sample Expiration      02/03/2024,2359 Performed at [facility], [address], [place], [place] [address]   CBC   Collection Time: 01/31/24 10:25 AM  Result Value Ref Range   WBC 9.9 4.0 - 10.5 K/uL   RBC 4.05 (L) 4.22 - 5.81 MIL/uL   Hemoglobin 11.5 (L) 13.0 - 17.0 g/dL   HCT 46.9 (L) 62.9 - 52.8 %   MCV 85.4 80.0 - 100.0 fL   MCH 28.4 26.0 - 34.0 pg   MCHC 33.2 30.0 - 36.0 g/dL   RDW 41.3 24.4 - 01.0 %   Platelets 290 150 - 400 K/uL   nRBC 0.0 0.0 - 0.2 %  Heparin  level (unfractionated)   Collection Time: 01/31/24 10:25 AM  Result Value Ref Range   Heparin  Unfractionated 0.36 0.30 - 0.70 IU/mL  Basic metabolic panel   Collection Time: 01/31/24 10:25 AM  Result Value Ref Range   Sodium 133 (L) 135 - 145 mmol/L   Potassium 4.5 3.5 - 5.1 mmol/L   Chloride 99 98 - 111 mmol/L   CO2 26 22 - 32 mmol/L   Glucose, Bld 101 (H) 70 - 99 mg/dL   BUN 7 (L) 8 - 23 mg/dL   Creatinine, Ser 2.72 0.61 - 1.24 mg/dL   Calcium  8.4 (L) 8.9 - 10.3 mg/dL   GFR, Estimated >53 >66 mL/min   Anion gap 8 5 - 15  Magnesium   Collection Time: 01/31/24 10:25 AM  Result Value Ref Range   Magnesium 2.1 1.7 - 2.4 mg/dL  CBC   Collection Time: 02/01/24  7:27 AM  Result Value Ref Range   WBC 9.9 4.0 - 10.5 K/uL   RBC 3.99 (L) 4.22 - 5.81 MIL/uL   Hemoglobin 11.5 (L) 13.0 - 17.0 g/dL   HCT 44.0 (L) 34.7 - 42.5 %   MCV 87.2 80.0 - 100.0 fL   MCH 28.8 26.0 - 34.0 pg   MCHC 33.0 30.0 - 36.0 g/dL   RDW 95.6 38.7 - 56.4 %   Platelets 261 150 - 400 K/uL   nRBC 0.0 0.0 - 0.2 %  Heparin  level (unfractionated)   Collection Time: 02/01/24  7:27 AM  Result Value Ref Range   Heparin  Unfractionated 0.23  (L) 0.30 - 0.70 IU/mL  Basic metabolic panel   Collection Time: 02/01/24  7:27 AM  Result Value Ref Range   Sodium 131 (L) 135 - 145 mmol/L   Potassium 4.1 3.5 - 5.1 mmol/L   Chloride 101 98 - 111 mmol/L   CO2 21 (L) 22 - 32 mmol/L   Glucose, Bld 88 70 - 99 mg/dL   BUN 12 8 - 23 mg/dL   Creatinine, Ser 3.32 0.61 - 1.24 mg/dL   Calcium  8.5 (L) 8.9 - 10.3 mg/dL   GFR, Estimated >95 >18 mL/min   Anion gap 9 5 - 15  Surgical pcr screen   Collection Time: 02/01/24 11:48 AM   Specimen: Nasal Mucosa; Nasal Swab  Result Value Ref Range   MRSA, PCR NEGATIVE NEGATIVE   Staphylococcus aureus POSITIVE (A) NEGATIVE  Surgical pathology   Collection Time: 02/01/24  1:43 PM  Result Value Ref Range   SURGICAL PATHOLOGY      SURGICAL PATHOLOGY  THIS IS AN ADDENDUM REPORT  CASE: [id number] PATIENT: Colleen Kratz Surgical Pathology Report Addendum   Reason for Addendum #1:  DNA Mismatch Repair IHC Results  Clinical History: small bowel obstruction, R/O cancer (cm)     FINAL MICROSCOPIC DIAGNOSIS:  A. COLON MASS, SIGMOID, BIOPSY: Colonic adenocarcinoma  B. COLON, SIGMOID, COLECTOMY: Colonic adenocarcinoma, partially disrupted extending into pericolonic connective tissue Carcinoma involves disrupted portion of specimen Carcinoma involves mesenteric and distal margins Eleven lymph nodes negative for metastatic carcinoma (0/11) See oncology table and comment  C. COLON, SIGMOID, DISTAL MARGIN: Involved by colonic adenocarcinoma  D. COLON, SIGMOID, SECOND DISTAL MARGIN: Focally involved by colonic adenocarcinoma  E. COLON, SIGMOID, PROXIMAL MARGIN: Benign colon Negative for carcinoma  ONCOLOGY TABLE:   COLON AND RECTUM, CARCINOMA:  Resection Pro cedure: Exploratory laparotomy with partial colon resection Tumor Site: Sigmoid Tumor Size: 5 cm Macroscopic Tumor Perforation: Carcinoma involves distal abrupted segment of specimen Macroscopic Evaluation of Mesorectum (required  for rectal cancer): Not applicable Histologic Type: Colonic adenocarcinoma Histologic Grade: G2, moderately differentiated Multiple Primary Sites: Not applicable Tumor Extension: Into pericolonic connective tissue including disrupted portion Lymphovascular Invasion: Not identified Perineural Invasion: Not identified Treatment Effect: No known presurgical therapy Margins:      Margin Status for Invasive Carcinoma: Distal margin and mesenteric margin positive for carcinoma Regional Lymph Nodes:      Number of Lymph Nodes with Tumor: 0      Number of Lymph Nodes Examined: 11 Tumor Deposits: Not identified Distant Metastasis:      Distant Site(s) Involved: Not applicable Pathologic Stage Classification (pTNM, AJCC 8th Edition): pT4a, pN0 A ncillary Studies: MMR and MSI studies have been ordered Representative Tumor Block: B3-B7 Comments: The tumor is a moderately differentiated colonic adenocarcinoma which extends into pericolonic connective tissue including the disrupted portions consistent with pT4a. (v4.2.0.1)  INTRAOPERATIVE DIAGNOSIS: A1. 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      Rapid intraoperative consult diagnosis called to Dr. Metzger/Toth by Dr. Kavin Parsley @ 1411 on      02/01/2024.  GROSS DESCRIPTION: Specimen A: Received fresh for rapid intraoperative consult evaluation by frozen section is a 1.7 x 1.3 x 0.5 cm aggregate of tan-red soft to firm tissue, entirely submitted 1 block for frozen section.  Specimen B: Colon resection, sigmoid, received fresh. Specimen integrity: Disrupted over the distal half, including distal margin and mesenteric margin in this area.  Disrupted area is inked black and mese nteric margin is inked orange at gross . Orientation: Suture at proximal Specimen length: 11 cm Mesorectal intactness: Not applicable Tumor location: Distal half of  specimen Tumor size: 5 cm in length tan-red to dark red firm sessile and centrally ulcerated mass Percent of bowel circumference involved: 100%, stenosing the lumen to less than 1 cm. Tumor distance to margins:                      Proximal: 6 cm  Distal: Grossly present at disrupted distal margin                      Mesenteric: Tumor grossly present at the disrupted mesenteric margin Macroscopic extent of tumor invasion: Tumor grossly extends to the entire thickness of the wall and abuts the disrupted area. Total presumed lymph nodes: Found are 24 possible lymph nodes which range from 0.3 to 0.6 cm. Extramural satellite tumor nodules: None Mucosal polyp(s): None Additional findings: None Block summary: Block 1 = proximal margin Block 2 = distal margin Blocks 3-5 = tumor includin g inked disrupted area Block 6 = mesenteric margin Block 7 = tumor and adjacent mucosa Block 8 = 4 possible nodes Block 9 = 4 possible nodes Block 10 = 4 possible nodes Block 11 = 4 possible nodes Block 12 = 4 possible nodes Block 13 = 4 possible nodes Block 14 = tissue for molecular testing  Specimen C: Received fresh and clinically identified as sigmoid colectomy distal margin is a 4.4 x 2.8 x 2.2 cm irregular and partially disrupted portion of tissue which has a staple line on 1 aspect.  The external surface is surrounded by indurated fatty tissue and opposite surface has tan-red to dark red smooth to roughened mucosa.  On sectioning, there is tan-white to pale yellow indurated and vaguely nodular wall.  Representative sections adjacent to the staple line are submitted in 1 block.  Specimen D: Received fresh and clinically identified as sigmoid colectomy second distal margin is a 4.6 x 2.2 x 1.8 cm irregular and partially disrupted portion of predominantly  indurated pale yellow to dark red tissue with focal embedded staples.  Tan-pink to pink-red smooth to granular  mucosa is identified.  Representative sections adjacent to the area with staples are submitted in 1 block.  Specimen E: Received fresh and clinically identified as sigmoid colectomy proximal margin is a 2.8 x 2 cm and up to 1 cm thick portion of tissue which has tan-pink smooth, soft mucosa on 1 aspect and cut surfaces have muscularis with embedded metallic staples.  Representative sections are submitted in 1 block.  SW 02/03/2024  Final Diagnosis performed by Ramey Cleves, MD.   Electronically signed 02/04/2024 Technical and / or Professional components performed at [facility]. [facility], [address] [place], [place] [address].  Immunohistochemistry Technical component (if applicable) was performed at [facility]. [address], [address], [place], [place] [address].   IMMUNOHISTOCHEMISTRY DISCLAIMER (if applicable): Some of t hese immunohistochemical stains may have been developed and the performance characteristics determine by [facility]. Some may not have been cleared or approved by the U.S. Food and Drug Administration. The FDA has determined that such clearance or approval is not necessary. This test is used for clinical purposes. It should not be regarded as investigational or for research. This laboratory is certified under the Clinical Laboratory Improvement Amendments of 1988 (CLIA-88) as qualified to perform high complexity clinical laboratory testing.  The controls stained appropriately.   IHC stains are performed on formalin fixed, paraffin embedded tissue using a 3,3diaminobenzidine (DAB) chromogen and Leica Bond Autostainer System. The staining intensity of the nucleus is score manually and is reported as the percentage of tumor cell nuclei demonstrating specific nuclear staining. The specimens are fixed in 10% Neutral Formalin for at least 6 hours and up to 72hrs.  These tests are validated on decalcified tissue.  Results should be interpreted with caution given the possibility of  false negative results on decalcified specimens. Antibody Clones are as follows ER-clone 19F, PR-clone 16, Ki67- clone MM1. Some of these immunohistochemical stains may have been developed and the performance characteristics determined by [facility].        ADDENDUM:   Mismatch Repair Protein (IHC)  SUMMARY INTERPRETATION: NORMAL  There is preserved expression of the major MMR proteins. There is a very low probability that microsatellite instability (MSI) is present. However, certain clinically significant MMR protein mutations may result in preservation of nuclear expression. It is recommended that the preservation of protein expression be correlated with molecular based MSI testing.  IHC EXPRESSION RESULTS  TEST           RESULT MLH1:          Preserved nuclear expression MSH2:          Preserved nuclear expression MSH6:           Preserved nuclear expression PMS2:          Preserved nuclear expression  References: 1. Guidelines on Genetic Evaluation and Management of Lynch Syndrome: A Consensus Statement by the [place]  Multi-Society Task Force on Colorectal Cancer Quenten Brunette. Maribeth Shivers , MD, and other . Am Sharyon Deis 2014; [phone]; doi: 10.1038/ajg.2014.186; published online 05 April 2013 2. Outcomes of screening endometrial cancer patients for Lynch syndrome by patient-administered checklist. Sharion Davidson MS, and others. Gynecol Oncol 2013;131(3):619-623. 3. Muir-Torre syndrome (MTS): An update and approach to diagnosis and management. Melbourne Spitz, MD and others. J Am Acad Dermatol [phone]  Disclaimer Some of these immunohistochemical stains may have been developed and the performance characteristics determined by [facility]. Some may not have been cleared or approved by the U.S. Food and Drug Administration. The FDA has determined that such clearance or approval is not  ne cessary. This test is used for clinical purposes. It should not be regarded as investigational or for research. This laboratory is certified under the Clinical Laboratory Improvement Amendments of 1988 (CLIA-88) as qualified to perform high complexity clinical laboratory testing. Testing performed: [facility] [address], [address], [place] , [place] [address]           Addendum #1 performed by Lee Public, MD.   Electronically signed 02/05/2024 Technical component performed at [facility]. [facility], [address], [place], [place] [address].  Professional component performed at [facility], [address], [place], [place] [address].  Immunohistochemistry Technical component (if applicable) was performed at [facility]. [address], [place], [place] [address].   IMMUNOHISTOCHEMISTRY DISCLAIMER (if applicable): Some of these immunohistochemical stains may have been developed and the pe rformance characteristics determine by [facility]. Some may not have been cleared or approved by the U.S. Food and Drug Administration. The FDA has determined that such clearance or approval is not necessary. This test is used for clinical purposes. It should not be regarded as investigational or for research. This laboratory is certified under the Clinical Laboratory Improvement Amendments of 1988 (CLIA-88) as qualified to perform high complexity clinical laboratory testing.  The controls stained appropriately.   IHC stains are performed on formalin fixed, paraffin embedded tissue using a 3,3diaminobenzidine (DAB) chromogen and Leica Bond Autostainer System. The staining intensity of the nucleus is score manually and is reported as the percentage of tumor cell nuclei demonstrating specific nuclear staining. The specimens are fixed in 10% Neutral Formalin for at least 6 hours and up to 72hrs. These tests  are  validated on decalcified tissue. Results should b e interpreted with caution given the possibility of false negative results on decalcified specimens. Antibody Clones are as follows ER-clone 88F, PR-clone 16, Ki67- clone MM1. Some of these immunohistochemical stains may have been developed and the performance characteristics determined by [facility].   Basic metabolic panel   Collection Time: 02/02/24  7:44 AM  Result Value Ref Range   Sodium 133 (L) 135 - 145 mmol/L   Potassium 4.3 3.5 - 5.1 mmol/L   Chloride 101 98 - 111 mmol/L   CO2 22 22 - 32 mmol/L   Glucose, Bld 127 (H) 70 - 99 mg/dL   BUN 17 8 - 23 mg/dL   Creatinine, Ser 1.61 0.61 - 1.24 mg/dL   Calcium  8.2 (L) 8.9 - 10.3 mg/dL   GFR, Estimated >09 >60 mL/min   Anion gap 10 5 - 15  CBC   Collection Time: 02/02/24  7:44 AM  Result Value Ref Range   WBC 11.4 (H) 4.0 - 10.5 K/uL   RBC 4.01 (L) 4.22 - 5.81 MIL/uL   Hemoglobin 11.4 (L) 13.0 - 17.0 g/dL   HCT 45.4 (L) 09.8 - 11.9 %   MCV 85.3 80.0 - 100.0 fL   MCH 28.4 26.0 - 34.0 pg   MCHC 33.3 30.0 - 36.0 g/dL   RDW 14.7 82.9 - 56.2 %   Platelets 307 150 - 400 K/uL   nRBC 0.0 0.0 - 0.2 %  CEA   Collection Time: 02/02/24  7:44 AM  Result Value Ref Range   CEA 3.3 0.0 - 4.7 ng/mL  Heparin  level (unfractionated)   Collection Time: 02/02/24  7:44 AM  Result Value Ref Range   Heparin  Unfractionated 0.33 0.30 - 0.70 IU/mL  CBC   Collection Time: 02/03/24  7:00 AM  Result Value Ref Range   WBC 10.9 (H) 4.0 - 10.5 K/uL   RBC 3.86 (L) 4.22 - 5.81 MIL/uL   Hemoglobin 11.2 (L) 13.0 - 17.0 g/dL   HCT 13.0 (L) 86.5 - 78.4 %   MCV 85.2 80.0 - 100.0 fL   MCH 29.0 26.0 - 34.0 pg   MCHC 34.0 30.0 - 36.0 g/dL   RDW 69.6 29.5 - 28.4 %   Platelets 332 150 - 400 K/uL   nRBC 0.0 0.0 - 0.2 %  Heparin  level (unfractionated)   Collection Time: 02/03/24  7:00 AM  Result Value Ref Range   Heparin  Unfractionated 0.50 0.30 - 0.70 IU/mL  Comprehensive metabolic panel    Collection Time: 02/03/24  7:00 AM  Result Value Ref Range   Sodium 133 (L) 135 - 145 mmol/L   Potassium 4.2 3.5 - 5.1 mmol/L   Chloride 99 98 - 111 mmol/L   CO2 24 22 - 32 mmol/L   Glucose, Bld 107 (H) 70 - 99 mg/dL   BUN 17 8 - 23 mg/dL   Creatinine, Ser 1.32 0.61 - 1.24 mg/dL   Calcium  8.2 (L) 8.9 - 10.3 mg/dL   Total Protein 5.5 (L) 6.5 - 8.1 g/dL   Albumin 2.3 (L) 3.5 - 5.0 g/dL   AST 23 15 - 41 U/L   ALT 16 0 - 44 U/L   Alkaline Phosphatase 47 38 - 126 U/L   Total Bilirubin 0.5 0.0 - 1.2 mg/dL   GFR, Estimated >44 >01 mL/min   Anion gap 10 5 - 15  Basic metabolic panel   Collection Time: 02/04/24  4:50 AM  Result Value Ref Range   Sodium  136 135 - 145 mmol/L   Potassium 4.2 3.5 - 5.1 mmol/L   Chloride 102 98 - 111 mmol/L   CO2 24 22 - 32 mmol/L   Glucose, Bld 90 70 - 99 mg/dL   BUN 15 8 - 23 mg/dL   Creatinine, Ser 1.61 0.61 - 1.24 mg/dL   Calcium  8.4 (L) 8.9 - 10.3 mg/dL   GFR, Estimated >09 >60 mL/min   Anion gap 10 5 - 15  CBC   Collection Time: 02/04/24  4:50 AM  Result Value Ref Range   WBC 7.4 4.0 - 10.5 K/uL   RBC 3.79 (L) 4.22 - 5.81 MIL/uL   Hemoglobin 10.9 (L) 13.0 - 17.0 g/dL   HCT 45.4 (L) 09.8 - 11.9 %   MCV 86.3 80.0 - 100.0 fL   MCH 28.8 26.0 - 34.0 pg   MCHC 33.3 30.0 - 36.0 g/dL   RDW 14.7 82.9 - 56.2 %   Platelets 338 150 - 400 K/uL   nRBC 0.0 0.0 - 0.2 %  Heparin  level (unfractionated)   Collection Time: 02/04/24  4:50 AM  Result Value Ref Range   Heparin  Unfractionated 0.30 0.30 - 0.70 IU/mL  Basic metabolic panel   Collection Time: 02/05/24 11:54 AM  Result Value Ref Range   Sodium 136 135 - 145 mmol/L   Potassium 4.1 3.5 - 5.1 mmol/L   Chloride 102 98 - 111 mmol/L   CO2 26 22 - 32 mmol/L   Glucose, Bld 104 (H) 70 - 99 mg/dL   BUN 9 8 - 23 mg/dL   Creatinine, Ser 1.30 0.61 - 1.24 mg/dL   Calcium  8.7 (L) 8.9 - 10.3 mg/dL   GFR, Estimated >86 >57 mL/min   Anion gap 8 5 - 15  CBC   Collection Time: 02/05/24 11:54 AM  Result Value  Ref Range   WBC 7.3 4.0 - 10.5 K/uL   RBC 4.23 4.22 - 5.81 MIL/uL   Hemoglobin 12.1 (L) 13.0 - 17.0 g/dL   HCT 84.6 (L) 96.2 - 95.2 %   MCV 86.8 80.0 - 100.0 fL   MCH 28.6 26.0 - 34.0 pg   MCHC 33.0 30.0 - 36.0 g/dL   RDW 84.1 32.4 - 40.1 %   Platelets 376 150 - 400 K/uL   nRBC 0.0 0.0 - 0.2 %  CBC   Collection Time: 02/06/24  4:37 AM  Result Value Ref Range   WBC 9.6 4.0 - 10.5 K/uL   RBC 4.51 4.22 - 5.81 MIL/uL   Hemoglobin 12.9 (L) 13.0 - 17.0 g/dL   HCT 02.7 (L) 25.3 - 66.4 %   MCV 86.3 80.0 - 100.0 fL   MCH 28.6 26.0 - 34.0 pg   MCHC 33.2 30.0 - 36.0 g/dL   RDW 40.3 47.4 - 25.9 %   Platelets 403 (H) 150 - 400 K/uL   nRBC 0.0 0.0 - 0.2 %  CBC   Collection Time: 02/07/24  5:45 AM  Result Value Ref Range   WBC 8.2 4.0 - 10.5 K/uL   RBC 4.29 4.22 - 5.81 MIL/uL   Hemoglobin 12.1 (L) 13.0 - 17.0 g/dL   HCT 56.3 (L) 87.5 - 64.3 %   MCV 85.8 80.0 - 100.0 fL   MCH 28.2 26.0 - 34.0 pg   MCHC 32.9 30.0 - 36.0 g/dL   RDW 32.9 51.8 - 84.1 %   Platelets 384 150 - 400 K/uL   nRBC 0.0 0.0 - 0.2 %  Basic metabolic panel with GFR  Collection Time: 02/07/24  5:45 AM  Result Value Ref Range   Sodium 137 135 - 145 mmol/L   Potassium 4.0 3.5 - 5.1 mmol/L   Chloride 107 98 - 111 mmol/L   CO2 23 22 - 32 mmol/L   Glucose, Bld 105 (H) 70 - 99 mg/dL   BUN 10 8 - 23 mg/dL   Creatinine, Ser 1.61 0.61 - 1.24 mg/dL   Calcium  8.8 (L) 8.9 - 10.3 mg/dL   GFR, Estimated >09 >60 mL/min   Anion gap 7 5 - 15  Magnesium   Collection Time: 02/07/24  5:45 AM  Result Value Ref Range   Magnesium 2.2 1.7 - 2.4 mg/dL  CBC   Collection Time: 02/09/24  5:38 AM  Result Value Ref Range   WBC 8.1 4.0 - 10.5 K/uL   RBC 4.41 4.22 - 5.81 MIL/uL   Hemoglobin 12.7 (L) 13.0 - 17.0 g/dL   HCT 45.4 (L) 09.8 - 11.9 %   MCV 86.8 80.0 - 100.0 fL   MCH 28.8 26.0 - 34.0 pg   MCHC 33.2 30.0 - 36.0 g/dL   RDW 14.7 82.9 - 56.2 %   Platelets 406 (H) 150 - 400 K/uL   nRBC 0.0 0.0 - 0.2 %  Basic metabolic panel  with GFR   Collection Time: 02/09/24  5:38 AM  Result Value Ref Range   Sodium 136 135 - 145 mmol/L   Potassium 4.6 3.5 - 5.1 mmol/L   Chloride 108 98 - 111 mmol/L   CO2 23 22 - 32 mmol/L   Glucose, Bld 97 70 - 99 mg/dL   BUN 24 (H) 8 - 23 mg/dL   Creatinine, Ser 1.30 0.61 - 1.24 mg/dL   Calcium  8.5 (L) 8.9 - 10.3 mg/dL   GFR, Estimated >86 >57 mL/min   Anion gap 5 5 - 15  Magnesium   Collection Time: 02/09/24  5:38 AM  Result Value Ref Range   Magnesium 2.2 1.7 - 2.4 mg/dL       Pertinent labs & imaging results that were available during my care of the patient were reviewed by me and considered in my medical decision making.  Assessment & Plan:  Alven was seen today for hospitalization follow-up.  Diagnoses and all orders for this visit:  Hospital discharge follow-up -     CMP14+EGFR -     CBC with Differential/Platelet -     Lipid panel -     Thyroid  Panel With TSH -     VITAMIN D  25 Hydroxy (Vit-D Deficiency, Fractures) -     Vitamin B12 -     Ambulatory referral to Home Health  Malignant neoplasm of sigmoid colon (HCC) -     CMP14+EGFR -     CBC with Differential/Platelet -     VITAMIN D  25 Hydroxy (Vit-D Deficiency, Fractures) -     Vitamin B12 -     Ambulatory referral to Home Health  Colostomy status (HCC) -     Ambulatory referral to Home Health  Mixed hyperlipidemia -     CMP14+EGFR -     Lipid panel -     Ambulatory referral to Home Health  PAD (peripheral artery disease) (HCC) -     CMP14+EGFR -     CBC with Differential/Platelet -     Lipid panel -     Ambulatory referral to Home Health  Vitamin D  deficiency -     CMP14+EGFR -     VITAMIN D  25  Hydroxy (Vit-D Deficiency, Fractures) -     Ambulatory referral to Home Health  Coronary artery disease involving native coronary artery of native heart without angina pectoris -     CMP14+EGFR -     CBC with Differential/Platelet -     Lipid panel -     Thyroid  Panel With TSH -     VITAMIN D  25  Hydroxy (Vit-D Deficiency, Fractures) -     Vitamin B12 -     Ambulatory referral to Home Health  Weight loss -     CMP14+EGFR -     CBC with Differential/Platelet -     Lipid panel -     Thyroid  Panel With TSH -     VITAMIN D  25 Hydroxy (Vit-D Deficiency, Fractures) -     Vitamin B12 -     Ambulatory referral to Home Health      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 Surgical resection of a 6 cm sigmoid colon segment was performed with colostomy placement. Complete resection is believed, but adjuvant chemotherapy with capecitabine (Xeloda) is recommended to prevent recurrence. Chemotherapy will follow a regimen of two weeks on, one week off, for eight cycles. Regular blood work monitoring will guide dosage adjustments to mitigate adverse effects such as leukopenia and thrombocytopenia. Dosage will be based on body weight and lab results. Increased cardiac risk due to heart disease history; echocardiogram in May assessed cardiac function. Oncologist will consider tumor markers and blood counts for potential treatment duration adjustments. - Initiate chemotherapy with capecitabine (Xeloda) as prescribed. - Regularly monitor blood work to adjust dosage and prevent adverse effects. - Ensure oncologist considers cardiac history and update echocardiogram if necessary. - Evaluate tumor markers and blood counts for potential treatment duration adjustments.  Colostomy Care Colostomy placed post-sigmoid colon resection. He is unable to change the colostomy bag independently due to limited neck mobility from prior neck surgery. Home health services assist with colostomy care. Colostomy reversal is anticipated two months post-chemotherapy, contingent on treatment outcomes and tissue condition. - Schedule weekly home health nurse visits for colostomy care. - Instruct to empty colostomy bag 4-5 times daily to prevent  overfilling.  Nutritional Support Experiencing weight loss despite good appetite. Consuming Boost nutritional drinks daily and advised to consume high-protein foods like fish. Difficulty with certain foods, such as hamburgers, causing discomfort. Encouraged to maintain a high-protein diet to support nutritional needs. - Continue daily Boost nutritional drinks. - Encourage high-protein diet, including fish. - Avoid discomfort-causing foods, such as hamburgers.  Follow-up Multiple follow-up appointments scheduled with specialists to monitor condition and treatment progress. Frequent contact with oncologist and other specialists during treatment period. - Follow up with oncologist on June 23 for blood work and chemotherapy management. 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 - Plan to see family medicine provider at year-end unless needed sooner.       Total time spent with patient today was 45 minutes, this time was spent reviewing prior charts, labs, x-rays, discussing plan of care, and documenting the encounter.     Continue all other maintenance medications.  Follow up plan: Return in about 6 months (around 08/25/2024), or if symptoms worsen or fail to improve.   Continue healthy lifestyle choices, including diet (rich in fruits, vegetables, and lean proteins, and low in salt and simple carbohydrates) and exercise (at least 30 minutes of moderate physical activity daily).  Educational handout given for  colorectal cancer  The above assessment and management plan was discussed with the patient. The patient verbalized understanding of and has agreed to the management plan. Patient is aware to call the clinic if they develop any new symptoms or if symptoms persist or worsen. Patient is aware when to return to the clinic for a follow-up visit. Patient educated on when it is appropriate to go to the emergency department.   Kattie Parrot,  FNP-C [facility] [phone]

## 2024-02-25 ENCOUNTER — Other Ambulatory Visit: Payer: Self-pay | Admitting: *Deleted

## 2024-02-25 ENCOUNTER — Ambulatory Visit: Payer: Self-pay | Admitting: Family Medicine

## 2024-02-25 ENCOUNTER — Ambulatory Visit: Admitting: Family Medicine

## 2024-02-25 ENCOUNTER — Encounter: Payer: Self-pay | Admitting: *Deleted

## 2024-02-25 LAB — LIPID PANEL
Cholesterol, Total: 139 mg/dL (ref 100–199)
HDL: 40 mg/dL (ref >39)
LDL CALC COMMENT:: 3.5 ratio (ref 0.0–5.0)
LDL Chol Calc (NIH): 83 mg/dL (ref 0–99)
Triglycerides: 80 mg/dL (ref 0–149)
VLDL Cholesterol Cal: 16 mg/dL (ref 5–40)

## 2024-02-25 LAB — CBC WITH DIFFERENTIAL/PLATELET
Basophils Absolute: 0 10*3/uL (ref 0.0–0.2)
Basos: 1 %
EOS (ABSOLUTE): 0.3 10*3/uL (ref 0.0–0.4)
Eos: 4 %
Hematocrit: 38 % (ref 37.5–51.0)
Hemoglobin: 12.1 g/dL — ABNORMAL LOW (ref 13.0–17.7)
Immature Grans (Abs): 0 10*3/uL (ref 0.0–0.1)
Immature Granulocytes: 0 %
Lymphocytes Absolute: 1.2 10*3/uL (ref 0.7–3.1)
Lymphs: 18 %
MCH: 28.7 pg (ref 26.6–33.0)
MCHC: 31.8 g/dL (ref 31.5–35.7)
MCV: 90 fL (ref 79–97)
Monocytes Absolute: 0.7 10*3/uL (ref 0.1–0.9)
Monocytes: 10 %
Neutrophils Absolute: 4.7 10*3/uL (ref 1.4–7.0)
Neutrophils: 67 %
Platelets: 235 10*3/uL (ref 150–450)
RBC: 4.21 x10E6/uL (ref 4.14–5.80)
RDW: 14.3 % (ref 11.6–15.4)
WBC: 6.9 10*3/uL (ref 3.4–10.8)

## 2024-02-25 LAB — VITAMIN D 25 HYDROXY (VIT D DEFICIENCY, FRACTURES): Vit D, 25-Hydroxy: 26.8 ng/mL — ABNORMAL LOW (ref 30.0–100.0)

## 2024-02-25 LAB — CMP14+EGFR
ALT: 18 IU/L (ref 0–44)
AST: 19 IU/L (ref 0–40)
Albumin: 3.7 g/dL — ABNORMAL LOW (ref 3.8–4.8)
Alkaline Phosphatase: 67 IU/L (ref 44–121)
BUN/Creatinine Ratio: 26 — ABNORMAL HIGH (ref 10–24)
BUN: 17 mg/dL (ref 8–27)
Bilirubin Total: 0.3 mg/dL (ref 0.0–1.2)
CO2: 24 mmol/L (ref 20–29)
Calcium: 9.2 mg/dL (ref 8.6–10.2)
Chloride: 102 mmol/L (ref 96–106)
Creatinine, Ser: 0.66 mg/dL — ABNORMAL LOW (ref 0.76–1.27)
Globulin, Total: 1.9 g/dL (ref 1.5–4.5)
Glucose: 87 mg/dL (ref 70–99)
Potassium: 4.6 mmol/L (ref 3.5–5.2)
Sodium: 139 mmol/L (ref 134–144)
Total Protein: 5.6 g/dL — ABNORMAL LOW (ref 6.0–8.5)
eGFR: 96 mL/min/{1.73_m2} (ref >59)

## 2024-02-25 LAB — THYROID PANEL WITH TSH
Free Thyroxine Index: 1.8 (ref 1.2–4.9)
T3 Uptake Ratio: 27 % (ref 24–39)
T4, Total: 6.5 ug/dL (ref 4.5–12.0)
TSH: 0.631 u[IU]/mL (ref 0.450–4.500)

## 2024-02-25 LAB — VITAMIN B12: Vitamin B-12: 625 pg/mL (ref 232–1245)

## 2024-02-25 NOTE — Patient Outreach (Signed)
 Transition of Care week 3  Visit Note  02/25/2024  Name: [name] [name]. MRN: [id number]          DOB: 1946-04-30  Situation: Patient enrolled in [facility] 30-day program. Visit completed with patient by telephone.   Background:    Past Medical History:  Diagnosis Date   Coronary artery disease    Erectile dysfunction    HFrEF (heart failure with reduced ejection fraction) (HCC)    Echocardiogram 07/14/21:  EF 25, global HK, inf HK worse, Gr 2 DD, normal RVSF, mild to mod MR, AV sclerosis w/o AS Echocardiogram 2/23: EF 25-30, global HK, GLS -12.9, normal RVSF, mild to mod MR, AV sclerosis w/o AS    History of hiatal hernia    a lone time ago   Hyperlipidemia    [place] fracture [facility]) 03/22/2020   Myocardial infarction (HCC)    x 2   Polio     Assessment: Patient Reported Symptoms: Cognitive Cognitive Status: Able to follow simple commands, Alert and oriented to person, place, and time, Normal speech and language skills      Neurological Neurological Review of Symptoms: No symptoms reported    HEENT HEENT Symptoms Reported: No symptoms reported      Cardiovascular Cardiovascular Symptoms Reported: No symptoms reported    Respiratory Respiratory Symptoms Reported: No symptoms reported    Endocrine Patient reports the following symptoms related to hypoglycemia or hyperglycemia : No symptoms reported    Gastrointestinal Gastrointestinal Symptoms Reported: No symptoms reported Gastrointestinal Conditions: Other Other Gastrointestinal Conditions: diverticulitis Gastrointestinal Management Strategies: Colostomy Gastrointestinal Self-Management Outcome: 4 (good) Gastrointestinal Comment: pt states appliance is staying intact well, pt is independent with emptying bag, HH RN continues to teach/ reinforce changing wafer and care of peristomal skin    Genitourinary Genitourinary Symptoms Reported: No symptoms reported    Integumentary Integumentary Symptoms Reported: No symptoms  reported Additional Integumentary Details: pt states wound is now healed and he has been released by the surgeon    Musculoskeletal Musculoskelatal Symptoms Reviewed: No symptoms reported Additional Musculoskeletal Details: has cane but not using at present        Psychosocial Psychosocial Symptoms Reported: No symptoms reported         There were no vitals filed for this visit.  Medications Reviewed Today     Reviewed by Daralyn Earl, RN (Registered Nurse) on 02/25/24 at 1457  Med List Status: <None>   Medication Order Taking? Sig Documenting Provider Last Dose Status Informant  albuterol  (VENTOLIN  HFA) 108 (90 Base) MCG/ACT inhaler [id number] Yes Inhale 2 puffs into the lungs every 6 (six) hours as needed for wheezing (cough). Galvin Jules, FNP  Active Self, Pharmacy Records  apixaban  (ELIQUIS ) 5 MG TABS tablet [id number] Yes Take 1 tablet (5 mg total) by mouth 2 (two) times daily. Krishnan, Gokul, MD  Active   cholecalciferol  (VITAMIN D3) 25 MCG (1000 UNIT) tablet [id number] Yes Take 1,000 Units by mouth daily.  [provider]  Active Self, Pharmacy Records  ketoconazole (NIZORAL) 2 % cream [id number] Yes Apply 1 Application topically 2 (two) times daily. [provider]  Active Self, Pharmacy Records  metoprolol  tartrate (LOPRESSOR ) 25 MG tablet [id number] Yes Take 0.5 tablets (12.5 mg total) by mouth 2 (two) times daily. Krishnan, Gokul, MD  Active   nitroGLYCERIN  (NITROSTAT ) 0.4 MG SL tablet [id number] Yes Place 1 tablet (0.4 mg total) under the tongue every 5 (five) minutes as needed for chest pain. Zorita Hiss, FNP  Active Self, Pharmacy Records  oxyCODONE  (OXY IR/ROXICODONE ) 5 MG immediate release tablet [id number] Yes Take 1-2 tablets (5-10 mg total) by mouth every 6 (six) hours as needed for severe pain (pain score 7-10). Krishnan, Gokul, MD  Active   polyethylene glycol powder (GLYCOLAX /MIRALAX ) 17 GM/SCOOP powder [id number] Yes Take 17 g by mouth daily.  Krishnan, Gokul, MD  Active   rosuvastatin  (CRESTOR ) 20 MG tablet [id number] Yes Take 1 tablet (20 mg total) by mouth daily. Gabino Joe, PA-C  Active             Recommendation:   PCP Follow-up Continue Current Plan of Care and working closely with home health  Follow Up Plan:   Telephone follow-up 03/04/24 @ 945 am  Cecilie Coffee [facility], BSN RN Care Manager/ Transition of Care [place]/ [facility] [phone]

## 2024-02-25 NOTE — Patient Instructions (Signed)
 Visit Information  Thank you for taking time to visit with me today. Please don't hesitate to contact me if I can be of assistance to you before our next scheduled telephone appointment.  Our next appointment is by telephone on 03/04/24 @ 945 am  Following is a copy of your care plan:   Goals Addressed             This Visit's Progress    VBCI Transitions of Care (TOC) Care Plan       Problems: Recent Hospitalization for treatment of Diverticulitis and wound care, colostomy Home Health services barrier: [facility] has not seen pt yet, will do first visit 02/14/24, Limited social support: patient's son lives 35 miles away, is supportive but cannot see pt daily for wound care, No Hospital Follow Up Provider appointment patient declines for RN Care Manager to schedule a post hospital follow up appointment citing he has too many other appointments now and will schedule later, and No Specialist appointment patient does know when his appointment is with surgeon, RN Care Manager made follow up appointment with Dr. Gertie Kub for 02/22/24 @ 430 pm, pt verbalizes understanding Patient lives alone, states he saw wound care and ostomy change (wafer) done in hospital but he needs teaching/ reinforcement in the home, pt is able to empty ostomy bag, states he will wait for home health RN on 6/1 for assistance and talk to his son and maybe can assist  02/18/24- pt states home health nurse is seeing him at least 3 x per week, changing dressing to abdominal wound and no longer daily/ BID, pt states [facility] RN in process of teaching, providing reinforcement to change ostomy wafer and care of peristomal skin, no new concerns reported 02/25/24- pt reports wound is completely healed, surgeon released pt, home health continues and RN assisting with care of colostomy, changing wafer, etc.  Pt states he cannot afford Eliquis  @ 314$ per month, reports has medication on hand but going forward this will be an issue, pharmacist  is to outreach pt on 03/10/24 for medication assistance, pt verbalizes understanding.  Goal:  Over the next 30 days, the patient will not experience hospital readmission  Interventions: Diverticulitis/ colostomy/ wound care Reinforced signs/ symptoms of infection, reportable signs /symptoms Reviewed care of ostomy, importance of emptying regularly before bag gets too full, importance of changing immediately for any leakage, having son involved in changing wafer if needed and using a mirror to visualize,continue to work with home health Reviewed upcoming scheduled appointments including PharmD on 03/10/24 Ask patient to call RN Care Manager for any questions, pt has contact number  Patient Self Care Activities:  Attend all scheduled provider appointments Call pharmacy for medication refills 3-7 days in advance of running out of medications Call provider office for new concerns or questions  Notify RN Care Manager of TOC call rescheduling needs Participate in Transition of Care Program/Attend TOC scheduled calls Perform all self care activities independently  Take medications as prescribed   Please work with home health RN on wound care and changing ostomy wafer Change ostomy wafer immediately if you have any leakage  Plan:  Telephone follow up appointment with care management team member scheduled for:  03/04/24 @ 945 am with Dionne Leath RN The patient has been provided with contact information for the care management team and has been advised to call with any health related questions or concerns.         Patient verbalizes understanding of instructions and  care plan provided today and agrees to view in MyChart. Active MyChart status and patient understanding of how to access instructions and care plan via MyChart confirmed with patient.     Telephone follow up appointment with care management team member scheduled for: 03/04/24 @ 945 am  Please call the care guide team at [phone]  if you need to cancel or reschedule your appointment.   Please call the Suicide and Crisis Lifeline: 988 call the [place]  National Suicide Prevention Lifeline: 5054459569 or TTY: (778)644-4316 TTY (813)377-2535) to talk to a trained counselor call 1-800-273-TALK (toll free, 24 hour hotline) go to [facility] [address] [place] [id number]) call the [place] Crisis Line: (272)853-1644 call 911 if you are experiencing a Mental Health or Behavioral Health Crisis or need someone to talk to.  Cecilie Coffee [facility], BSN RN Care Manager/ Transition of Care [place]/ [facility] [phone]

## 2024-02-26 DIAGNOSIS — D630 Anemia in neoplastic disease: Secondary | ICD-10-CM | POA: Diagnosis not present

## 2024-02-26 DIAGNOSIS — Z48815 Encounter for surgical aftercare following surgery on the digestive system: Secondary | ICD-10-CM | POA: Diagnosis not present

## 2024-02-26 DIAGNOSIS — I252 Old myocardial infarction: Secondary | ICD-10-CM | POA: Diagnosis not present

## 2024-02-26 DIAGNOSIS — Z7901 Long term (current) use of anticoagulants: Secondary | ICD-10-CM | POA: Diagnosis not present

## 2024-02-26 DIAGNOSIS — I5022 Chronic systolic (congestive) heart failure: Secondary | ICD-10-CM | POA: Diagnosis not present

## 2024-02-26 DIAGNOSIS — N529 Male erectile dysfunction, unspecified: Secondary | ICD-10-CM | POA: Diagnosis not present

## 2024-02-26 DIAGNOSIS — Z87891 Personal history of nicotine dependence: Secondary | ICD-10-CM | POA: Diagnosis not present

## 2024-02-26 DIAGNOSIS — I2510 Atherosclerotic heart disease of native coronary artery without angina pectoris: Secondary | ICD-10-CM | POA: Diagnosis not present

## 2024-02-26 DIAGNOSIS — L308 Other specified dermatitis: Secondary | ICD-10-CM | POA: Diagnosis not present

## 2024-02-26 DIAGNOSIS — I110 Hypertensive heart disease with heart failure: Secondary | ICD-10-CM | POA: Diagnosis not present

## 2024-02-26 DIAGNOSIS — Z6823 Body mass index (BMI) 23.0-23.9, adult: Secondary | ICD-10-CM | POA: Diagnosis not present

## 2024-02-26 DIAGNOSIS — E782 Mixed hyperlipidemia: Secondary | ICD-10-CM | POA: Diagnosis not present

## 2024-02-26 DIAGNOSIS — I739 Peripheral vascular disease, unspecified: Secondary | ICD-10-CM | POA: Diagnosis not present

## 2024-02-26 DIAGNOSIS — Z433 Encounter for attention to colostomy: Secondary | ICD-10-CM | POA: Diagnosis not present

## 2024-02-26 DIAGNOSIS — C189 Malignant neoplasm of colon, unspecified: Secondary | ICD-10-CM | POA: Diagnosis not present

## 2024-02-26 DIAGNOSIS — B353 Tinea pedis: Secondary | ICD-10-CM | POA: Diagnosis not present

## 2024-02-26 DIAGNOSIS — K5790 Diverticulosis of intestine, part unspecified, without perforation or abscess without bleeding: Secondary | ICD-10-CM | POA: Diagnosis not present

## 2024-02-26 DIAGNOSIS — Z955 Presence of coronary angioplasty implant and graft: Secondary | ICD-10-CM | POA: Diagnosis not present

## 2024-03-02 ENCOUNTER — Other Ambulatory Visit: Payer: Self-pay

## 2024-03-03 ENCOUNTER — Telehealth

## 2024-03-03 DIAGNOSIS — Z48815 Encounter for surgical aftercare following surgery on the digestive system: Secondary | ICD-10-CM | POA: Diagnosis not present

## 2024-03-03 DIAGNOSIS — Z7901 Long term (current) use of anticoagulants: Secondary | ICD-10-CM | POA: Diagnosis not present

## 2024-03-03 DIAGNOSIS — I110 Hypertensive heart disease with heart failure: Secondary | ICD-10-CM | POA: Diagnosis not present

## 2024-03-03 DIAGNOSIS — N529 Male erectile dysfunction, unspecified: Secondary | ICD-10-CM | POA: Diagnosis not present

## 2024-03-03 DIAGNOSIS — K5790 Diverticulosis of intestine, part unspecified, without perforation or abscess without bleeding: Secondary | ICD-10-CM | POA: Diagnosis not present

## 2024-03-03 DIAGNOSIS — I739 Peripheral vascular disease, unspecified: Secondary | ICD-10-CM | POA: Diagnosis not present

## 2024-03-03 DIAGNOSIS — C189 Malignant neoplasm of colon, unspecified: Secondary | ICD-10-CM | POA: Diagnosis not present

## 2024-03-03 DIAGNOSIS — Z433 Encounter for attention to colostomy: Secondary | ICD-10-CM | POA: Diagnosis not present

## 2024-03-03 DIAGNOSIS — Z6823 Body mass index (BMI) 23.0-23.9, adult: Secondary | ICD-10-CM | POA: Diagnosis not present

## 2024-03-03 DIAGNOSIS — I252 Old myocardial infarction: Secondary | ICD-10-CM | POA: Diagnosis not present

## 2024-03-03 DIAGNOSIS — E782 Mixed hyperlipidemia: Secondary | ICD-10-CM | POA: Diagnosis not present

## 2024-03-03 DIAGNOSIS — D630 Anemia in neoplastic disease: Secondary | ICD-10-CM | POA: Diagnosis not present

## 2024-03-03 DIAGNOSIS — Z87891 Personal history of nicotine dependence: Secondary | ICD-10-CM | POA: Diagnosis not present

## 2024-03-03 DIAGNOSIS — I5022 Chronic systolic (congestive) heart failure: Secondary | ICD-10-CM | POA: Diagnosis not present

## 2024-03-03 DIAGNOSIS — Z955 Presence of coronary angioplasty implant and graft: Secondary | ICD-10-CM | POA: Diagnosis not present

## 2024-03-03 DIAGNOSIS — I2510 Atherosclerotic heart disease of native coronary artery without angina pectoris: Secondary | ICD-10-CM | POA: Diagnosis not present

## 2024-03-04 ENCOUNTER — Other Ambulatory Visit (HOSPITAL_COMMUNITY): Payer: Self-pay

## 2024-03-04 ENCOUNTER — Other Ambulatory Visit: Payer: Self-pay

## 2024-03-04 ENCOUNTER — Other Ambulatory Visit: Payer: Self-pay | Admitting: Physician Assistant

## 2024-03-04 ENCOUNTER — Telehealth: Payer: Self-pay | Admitting: Pharmacist

## 2024-03-04 ENCOUNTER — Ambulatory Visit (HOSPITAL_COMMUNITY)
Admission: RE | Admit: 2024-03-04 | Discharge: 2024-03-04 | Disposition: A | Discharge status: Home / Self Care | Source: Ambulatory Visit | Attending: Vascular Surgery | Admitting: Vascular Surgery

## 2024-03-04 DIAGNOSIS — I513 Intracardiac thrombosis, not elsewhere classified: Secondary | ICD-10-CM

## 2024-03-04 DIAGNOSIS — I739 Peripheral vascular disease, unspecified: Secondary | ICD-10-CM | POA: Diagnosis not present

## 2024-03-04 LAB — VAS US ABI WITH/WO TBI
Left ABI: 0.57
Right ABI: 0.93

## 2024-03-04 MED ORDER — APIXABAN 5 MG PO TABS
5.0000 mg | ORAL_TABLET | Freq: Two times a day (BID) | ORAL | 0 refills | Status: DC
  Filled 2024-03-04: qty 60, 30d supply, fill #0

## 2024-03-04 MED ORDER — METOPROLOL TARTRATE 25 MG PO TABS: 12.5000 mg | ORAL_TABLET | Freq: Two times a day (BID) | ORAL | 3 refills | Status: AC

## 2024-03-04 NOTE — Patient Instructions (Signed)
 Visit Information  Thank you for taking time to visit with me today. Please don't hesitate to contact me if I can be of assistance to you before our next scheduled telephone appointment.  Your next appointment is by telephone on 03/11/24 at 10: 30 am  Following is a copy of your care plan:   Goals Addressed             This Visit's Progress    VBCI Transitions of Care (TOC) Care Plan       Problems: Recent Hospitalization for treatment of Diverticulitis and wound care, colostomy Home Health services barrier: [facility] has not seen pt yet, will do first visit 02/14/24, Limited social support: patient's son lives 35 miles away, is supportive but cannot see pt daily for wound care, No Hospital Follow Up Provider appointment patient declines for RN Care Manager to schedule a post hospital follow up appointment citing he has too many other appointments now and will schedule later, and No Specialist appointment patient does know when his appointment is with surgeon, RN Care Manager made follow up appointment with Dr. Gertie Kub for 02/22/24 @ 430 pm, pt verbalizes understanding Patient lives alone, states he saw wound care and ostomy change (wafer) done in hospital but he needs teaching/ reinforcement in the home, pt is able to empty ostomy bag, states he will wait for home health RN on 6/1 for assistance and talk to his son and maybe can assist  02/18/24- pt states home health nurse is seeing him at least 3 x per week, changing dressing to abdominal wound and no longer daily/ BID, pt states [facility] RN in process of teaching, providing reinforcement to change ostomy wafer and care of peristomal skin, no new concerns reported 02/25/24- pt reports wound is completely healed, surgeon released pt, home health continues and RN assisting with care of colostomy, changing wafer, etc.  Pt states he cannot afford Eliquis  @ 314$ per month, reports has medication on hand but going forward this will be an issue,  pharmacist is to outreach pt on 03/10/24 for medication assistance, pt verbalizes understanding. 03/04/24. Patient reports doing pretty good.  He admits some anxiousness with cancer center appointment next week.  Discussed positives and remaining optimistic about treatments.  Patient out of metoprolol  with no refills from transition of care pharmacy message to PCP and cardiology about metoprolol  and samples for Eliquis  as he may run out of Eliquis  prior to patient assistance being established.  Home health visiting weekly for colostomy change.  Patient has vascular appointment today and will inquire about Eliquis  samples while he is in the heart care building as well.   6/20 25 UPDATE 2:18 pm- Eliquis  samples ready for patient at PCP to pick up and metoprolol  refill sent to patient preferred pharmacy in [place].  Advised patient that Eliquis  samples ready at PCP and metoprolol  refill has been sent to his preferred pharmacy.  Patient states he will go pick medications.    Goal:  Over the next 30 days, the patient will not experience hospital readmission  Interventions: Diverticulitis/ colostomy/ wound care Reinforced signs/ symptoms of infection, reportable signs /symptoms Reviewed care of ostomy, importance of emptying regularly before bag gets too full, importance of changing immediately for any leakage, having son involved in changing wafer if needed and using a mirror to visualize,continue to work with home health Reviewed upcoming scheduled appointments including PharmD on 03/10/24 Ask patient to call RN Care Manager for any questions, pt has contact number  Patient Self Care Activities:  Attend all scheduled provider appointments Call pharmacy for medication refills 3-7 days in advance of running out of medications Call provider office for new concerns or questions  Notify RN Care Manager of TOC call rescheduling needs Participate in Transition of Care Program/Attend TOC scheduled calls Perform  all self care activities independently  Take medications as prescribed   Please work with home health RN on wound care and changing ostomy wafer Change ostomy wafer immediately if you have any leakage  Plan:  Telephone follow up appointment with care management team member scheduled for:  03/11/24 @ 10: 30 am with Cecilie Coffee RN The patient has been provided with contact information for the care management team and has been advised to call with any health related questions or concerns.         Patient verbalizes understanding of instructions and care plan provided today and agrees to view in MyChart. Active MyChart status and patient understanding of how to access instructions and care plan via MyChart confirmed with patient.     The patient has been provided with contact information for the care management team and has been advised to call with any health related questions or concerns.   Please call the care guide team at [phone] if you need to cancel or reschedule your appointment.   Please call the Suicide and Crisis Lifeline: 988 if you are experiencing a Mental Health or Behavioral Health Crisis or need someone to talk to.  Pricilla Moehle J. Santoria Chason RN, MSN [facility], [facility] Health RN Care Manager Direct Dial: [phone]  Fax: [phone] Website: [email or web address]

## 2024-03-04 NOTE — Transitions of Care (Post Inpatient/ED Visit) (Addendum)
 Transition of Care week 4  Visit Note  03/04/2024  Name: [name] [name]. MRN: [id number]          DOB: 1946/03/14  Situation: Patient enrolled in [facility] 30-day program. Visit completed with patient by telephone.   Background:   Initial Transition Care Management Follow-up Telephone Call    Past Medical History:  Diagnosis Date   Coronary artery disease    Erectile dysfunction    HFrEF (heart failure with reduced ejection fraction) (HCC)    Echocardiogram 07/14/21:  EF 25, global HK, inf HK worse, Gr 2 DD, normal RVSF, mild to mod MR, AV sclerosis w/o AS Echocardiogram 2/23: EF 25-30, global HK, GLS -12.9, normal RVSF, mild to mod MR, AV sclerosis w/o AS    History of hiatal hernia    a lone time ago   Hyperlipidemia    [place] fracture [facility]) 03/22/2020   Myocardial infarction (HCC)    x 2   Polio     Assessment: Patient Reported Symptoms: Cognitive Cognitive Status: Able to follow simple commands, Normal speech and language skills      Neurological Neurological Review of Symptoms: No symptoms reported    HEENT HEENT Symptoms Reported: No symptoms reported      Cardiovascular Cardiovascular Symptoms Reported: No symptoms reported    Respiratory      Endocrine Patient reports the following symptoms related to hypoglycemia or hyperglycemia : No symptoms reported    Gastrointestinal Gastrointestinal Symptoms Reported: No symptoms reported Gastrointestinal Conditions: Other Other Gastrointestinal Conditions: Diverticulitis Gastrointestinal Management Strategies: Colostomy Gastrointestinal Self-Management Outcome: 4 (good) Gastrointestinal Comment: Patient reports he is going once a week with changing wafer with home health assistance.  Reiterated ostomy care Nutrition Risk Screen (CP): No indicators present  Genitourinary Genitourinary Symptoms Reported: No symptoms reported    Integumentary Integumentary Symptoms Reported: No symptoms reported    Musculoskeletal  Musculoskelatal Symptoms Reviewed: No symptoms reported Additional Musculoskeletal Details: Has a cane but has not needed to use it        Psychosocial Psychosocial Symptoms Reported: No symptoms reported         There were no vitals filed for this visit.  Medications Reviewed Today     Reviewed by Elyssa Pendelton, RN (Case Manager) on 03/04/24 at 1014  Med List Status: <None>   Medication Order Taking? Sig Documenting Provider Last Dose Status Informant  albuterol  (VENTOLIN  HFA) 108 (90 Base) MCG/ACT inhaler [id number] Yes Inhale 2 puffs into the lungs every 6 (six) hours as needed for wheezing (cough). Galvin Jules, FNP  Active Self, Pharmacy Records  apixaban  (ELIQUIS ) 5 MG TABS tablet [id number] Yes Take 1 tablet (5 mg total) by mouth 2 (two) times daily. Krishnan, Gokul, MD  Active   cholecalciferol  (VITAMIN D3) 25 MCG (1000 UNIT) tablet [id number] Yes Take 1,000 Units by mouth daily.  [provider]  Active Self, Pharmacy Records  ketoconazole (NIZORAL) 2 % cream [id number] Yes Apply 1 Application topically 2 (two) times daily. [provider]  Active Self, Pharmacy Records  metoprolol  tartrate (LOPRESSOR ) 25 MG tablet [id number] Yes Take 0.5 tablets (12.5 mg total) by mouth 2 (two) times daily. Krishnan, Gokul, MD  Active   nitroGLYCERIN  (NITROSTAT ) 0.4 MG SL tablet [id number] Yes Place 1 tablet (0.4 mg total) under the tongue every 5 (five) minutes as needed for chest pain. Zorita Hiss, FNP  Active Self, Pharmacy Records  oxyCODONE  (OXY IR/ROXICODONE ) 5 MG immediate release tablet [id number] Yes Take 1-2 tablets (5-10 mg  total) by mouth every 6 (six) hours as needed for severe pain (pain score 7-10). Krishnan, Gokul, MD  Active   polyethylene glycol powder (GLYCOLAX /MIRALAX ) 17 GM/SCOOP powder [id number] Yes Take 17 g by mouth daily. Krishnan, Gokul, MD  Active   rosuvastatin  (CRESTOR ) 20 MG tablet [id number] Yes Take 1 tablet (20 mg total) by mouth daily. Gabino Joe, PA-C  Active             Goals      Patient Stated     Patient stay healthy      Patient Stated     To get well and find out more about his dx of Acute diverticulitis      VBCI Transitions of Care (TOC) Care Plan     Problems: Recent Hospitalization for treatment of Diverticulitis and wound care, colostomy Home Health services barrier: [facility] home [facility] has not seen pt yet, will do first visit 02/14/24, Limited social support: patient's son lives 35 miles away, is supportive but cannot see pt daily for wound care, No Hospital Follow Up Provider appointment patient declines for RN Care Manager to schedule a post hospital follow up appointment citing he has too many other appointments now and will schedule later, and No Specialist appointment patient does know when his appointment is with surgeon, RN Care Manager made follow up appointment with Dr. Gertie Kub for 02/22/24 @ 430 pm, pt verbalizes understanding Patient lives alone, states he saw wound care and ostomy change (wafer) done in hospital but he needs teaching/ reinforcement in the home, pt is able to empty ostomy bag, states he will wait for home health RN on 6/1 for assistance and talk to his son and maybe can assist  02/18/24- pt states home health nurse is seeing him at least 3 x per week, changing dressing to abdominal wound and no longer daily/ BID, pt states [facility] RN in process of teaching, providing reinforcement to change ostomy wafer and care of peristomal skin, no new concerns reported 02/25/24- pt reports wound is completely healed, surgeon released pt, home health continues and RN assisting with care of colostomy, changing wafer, etc.  Pt states he cannot afford Eliquis  @ 314$ per month, reports has medication on hand but going forward this will be an issue, pharmacist is to outreach pt on 03/10/24 for medication assistance, pt verbalizes understanding. 03/04/24. Patient reports doing pretty good.  He admits some anxiousness  with cancer center appointment next week.  Discussed positives and remaining optimistic about treatments.  Patient out of metoprolol  with no refills from transition of care pharmacy message to PCP and cardiology about metoprolol  and samples for Eliquis  as he may run out of Eliquis  prior to patient assistance being established.  Home health visiting weekly for colostomy change.  Patient has vascular appointment today and will inquire about Eliquis  samples while he is in the heart care building as well.   6/20 25 UPDATE 2:18 pm- Eliquis  samples ready for patient at PCP to pick up and metoprolol  refill sent to patient preferred pharmacy in [place].  Advised patient that Eliquis  samples ready at PCP and metoprolol  refill has been sent to his preferred pharmacy.  Patient states he will go pick medications.    Goal:  Over the next 30 days, the patient will not experience hospital readmission  Interventions: Diverticulitis/ colostomy/ wound care Reinforced signs/ symptoms of infection, reportable signs /symptoms Reviewed care of ostomy, importance of emptying regularly before bag gets too full, importance of changing immediately for  any leakage, having son involved in changing wafer if needed and using a mirror to visualize,continue to work with home health Reviewed upcoming scheduled appointments including PharmD on 03/10/24 Ask patient to call RN Care Manager for any questions, pt has contact number  Patient Self Care Activities:  Attend all scheduled provider appointments Call pharmacy for medication refills 3-7 days in advance of running out of medications Call provider office for new concerns or questions  Notify RN Care Manager of TOC call rescheduling needs Participate in Transition of Care Program/Attend TOC scheduled calls Perform all self care activities independently  Take medications as prescribed   Please work with home health RN on wound care and changing ostomy wafer Change ostomy wafer  immediately if you have any leakage  Plan:  Telephone follow up appointment with care management team member scheduled for:  03/11/24 @ 10: 30 am with Cecilie Coffee RN The patient has been provided with contact information for the care management team and has been advised to call with any health related questions or concerns.         Recommendation:   Continue Current Plan of Care  Follow Up Plan:   Telephone follow-up in 1 week Creta Dorame J. Blayne Frankie RN, MSN [facility], [facility] Health RN Care Manager Direct Dial: [phone]  Fax: [phone] Website: [email or web address]

## 2024-03-04 NOTE — Progress Notes (Signed)
 The proposed treatment discussed in conference is for discussion purpose only and is not a binding recommendation.  The patients have not been physically examined, or presented with their treatment options.  Therefore, final treatment plans cannot be decided.

## 2024-03-04 NOTE — Telephone Encounter (Signed)
 Patient walked in for samples of eliquis .  Eliquis  was not prescribed by cardiology. He has used free trial of eliquis . Eliquis  will cost [phone] with insurance. Xarelto will cost $45. PharmD offered to send xarelto to pharmacy and he states not right now he will get back to us .

## 2024-03-07 ENCOUNTER — Encounter: Payer: Self-pay | Admitting: Nurse Practitioner

## 2024-03-07 ENCOUNTER — Inpatient Hospital Stay: Admitting: Nutrition

## 2024-03-07 ENCOUNTER — Other Ambulatory Visit (HOSPITAL_COMMUNITY): Payer: Self-pay

## 2024-03-07 ENCOUNTER — Encounter: Admitting: Nutrition

## 2024-03-07 ENCOUNTER — Inpatient Hospital Stay (HOSPITAL_BASED_OUTPATIENT_CLINIC_OR_DEPARTMENT_OTHER): Admitting: Nurse Practitioner

## 2024-03-07 ENCOUNTER — Other Ambulatory Visit: Payer: Self-pay | Admitting: *Deleted

## 2024-03-07 ENCOUNTER — Inpatient Hospital Stay

## 2024-03-07 VITALS — BP 120/61 | HR 78 | Temp 98.2°F | Resp 18 | Ht 66.0 in | Wt 135.8 lb

## 2024-03-07 DIAGNOSIS — C187 Malignant neoplasm of sigmoid colon: Secondary | ICD-10-CM | POA: Diagnosis not present

## 2024-03-07 DIAGNOSIS — I2510 Atherosclerotic heart disease of native coronary artery without angina pectoris: Secondary | ICD-10-CM | POA: Diagnosis not present

## 2024-03-07 DIAGNOSIS — Z933 Colostomy status: Secondary | ICD-10-CM | POA: Diagnosis not present

## 2024-03-07 DIAGNOSIS — I110 Hypertensive heart disease with heart failure: Secondary | ICD-10-CM | POA: Diagnosis not present

## 2024-03-07 DIAGNOSIS — K6389 Other specified diseases of intestine: Secondary | ICD-10-CM

## 2024-03-07 DIAGNOSIS — E785 Hyperlipidemia, unspecified: Secondary | ICD-10-CM | POA: Diagnosis not present

## 2024-03-07 DIAGNOSIS — R918 Other nonspecific abnormal finding of lung field: Secondary | ICD-10-CM | POA: Diagnosis not present

## 2024-03-07 DIAGNOSIS — J439 Emphysema, unspecified: Secondary | ICD-10-CM | POA: Diagnosis not present

## 2024-03-07 LAB — CMP (CANCER CENTER ONLY)
ALT: 21 U/L (ref 0–44)
AST: 26 U/L (ref 15–41)
Albumin: 3.8 g/dL (ref 3.5–5.0)
Alkaline Phosphatase: 69 U/L (ref 38–126)
Anion gap: 10 (ref 5–15)
BUN: 18 mg/dL (ref 8–23)
CO2: 25 mmol/L (ref 22–32)
Calcium: 9.4 mg/dL (ref 8.9–10.3)
Chloride: 105 mmol/L (ref 98–111)
Creatinine: 0.64 mg/dL (ref 0.61–1.24)
GFR, Estimated: >60 mL/min (ref >60)
Glucose, Bld: 67 mg/dL — ABNORMAL LOW (ref 70–99)
Potassium: 4.3 mmol/L (ref 3.5–5.1)
Sodium: 139 mmol/L (ref 135–145)
Total Bilirubin: 0.3 mg/dL (ref 0.0–1.2)
Total Protein: 6.3 g/dL — ABNORMAL LOW (ref 6.5–8.1)

## 2024-03-07 LAB — CBC WITH DIFFERENTIAL (CANCER CENTER ONLY)
Abs Immature Granulocytes: 0.03 10*3/uL (ref 0.00–0.07)
Basophils Absolute: 0 10*3/uL (ref 0.0–0.1)
Basophils Relative: 0 %
Eosinophils Absolute: 0.2 10*3/uL (ref 0.0–0.5)
Eosinophils Relative: 3 %
HCT: 36.1 % — ABNORMAL LOW (ref 39.0–52.0)
Hemoglobin: 12.1 g/dL — ABNORMAL LOW (ref 13.0–17.0)
Immature Granulocytes: 0 %
Lymphocytes Relative: 21 %
Lymphs Abs: 1.4 10*3/uL (ref 0.7–4.0)
MCH: 29 pg (ref 26.0–34.0)
MCHC: 33.5 g/dL (ref 30.0–36.0)
MCV: 86.6 fL (ref 80.0–100.0)
Monocytes Absolute: 0.6 10*3/uL (ref 0.1–1.0)
Monocytes Relative: 9 %
Neutro Abs: 4.7 10*3/uL (ref 1.7–7.7)
Neutrophils Relative %: 67 %
Platelet Count: 179 10*3/uL (ref 150–400)
RBC: 4.17 MIL/uL — ABNORMAL LOW (ref 4.22–5.81)
RDW: 15.9 % — ABNORMAL HIGH (ref 11.5–15.5)
WBC Count: 6.9 10*3/uL (ref 4.0–10.5)
nRBC: 0 % (ref 0.0–0.2)

## 2024-03-07 MED ORDER — CAPECITABINE 500 MG PO TABS: ORAL_TABLET | ORAL | 0 refills | Status: DC

## 2024-03-07 NOTE — Progress Notes (Signed)
 78 yo male diagnosed with Colon Cancer and followed by Dr. Cloretta. S/p exploratory laparotomy and sigmoidectomy with colostomy on May 19. Treatment plan to be determined.  PMH includes CAD, Hiatal Hernia, HLD, MI, Polio.  Medications include Vitamin D3 and Miralax .  Labs include Vitamin D  26.8, Glucose 67  Height: 5'6.  Weight:  135 pounds 12.8 oz. June 23      BMI: 21.92. 148 pounds May 19 142 pounds Jan 2 UBW: 140-150 pounds  Patient reports feeling fine. Denies nausea, vomiting, constipation, and diarrhea. Reports emptying colostomy bag 3-4 times daily when it is 1/4 full. Drinks 2-3 ONS daily. Feels like he drinks adequate water and watches the color of his urine to be sure he is hydrated. He is eating most regular foods but does have some questions about specific foods like deer meat, which he loves but has been hesitant to eat.  Nutrition Diagnosis: Unintended weight loss related to colon cancer and associated treatments as evidenced by 9% weight loss in less than 2 months.  Intervention: Educated to consume small, frequent meals and snacks with increased calories and protein. Recommended ONS as tolerated. Try 1-2 Ensure Plus or equivalent daily. Samples and coupons provided. Bowel regimen. Adjust food intake as needed/tolerated. Contact information given. Patient provided with nutrition education sheets. Contact information given.  Monitoring, Evaluation, Goals: Increase calories and protein to minimize weight loss and promote healing.  Next Visit: To be scheduled as needed with upcoming treatment.

## 2024-03-07 NOTE — Progress Notes (Unsigned)
 [facility] OF [place]  ASSESSMENT / PLAN: [name] [name]. is a 78 y.o. 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 Blood glucose control with goal A1c < 7%. Blood pressure control with goal blood pressure < 130/80 mmHg. Lipid reduction therapy with goal LDL-C < 55 mg/dL. Aspirin  81mg  by mouth daily. Atorvastatin 40-80mg  PO QD (or other high intensity statin therapy). Daily walking to and past the point of discomfort.   Follow-up in 3 months with ABI to evaluate progress with walking regimen.  CHIEF COMPLAINT: Iliac occlusion on CT scan  HISTORY OF PRESENT ILLNESS: [name] [name]. is a 78 y.o. male referred to clinic for evaluation of left external iliac artery occlusion identified incidentally on CT scan of the chest abdomen and pelvis.  This was done for workup of chest and abdominal discomfort on 01/26/2024.  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  Complete excision was achieved.  Patient is due to start chemotherapy and radiation therapy.  He reports cramping discomfort in the left buttock and thigh after more than 100 yards.  This is more noticeable when he walks uphill.  This is not limiting to him.  He does not have any symptoms of rest pain.  He has no symptoms of ischemic ulceration.   Past Medical History:  Diagnosis Date   Coronary artery disease    Erectile dysfunction    HFrEF (heart failure with reduced ejection fraction) (HCC)    Echocardiogram 07/14/21:  EF 25, global HK, inf HK worse, Gr 2 DD, normal RVSF, mild to mod MR, AV sclerosis w/o AS Echocardiogram 2/23: EF 25-30, global HK, GLS -12.9, normal RVSF, mild to mod MR, AV sclerosis w/o AS    History of hiatal hernia    a lone time ago    Hyperlipidemia    [place] fracture [facility]) 03/22/2020   Myocardial infarction (HCC)    x 2   Polio     Past Surgical History:  Procedure Laterality Date   APPENDECTOMY     CARDIAC CATHETERIZATION     has two stents placed   CERVICAL SPINE SURGERY     c3-4, 4-5,  5-6, 8 screws and plates   COLOSTOMY Left 02/01/2024   Procedure: CREATION, COLOSTOMY;  Surgeon: Polly Cordella LABOR, MD;  Location: [facility] OR;  Service: General;  Laterality: Left;   CORONARY STENT INTERVENTION N/A 07/15/2021   Procedure: CORONARY STENT INTERVENTION;  Surgeon: Court Dorn JINNY, MD;  Location: [facility] INVASIVE CV LAB;  Service: Cardiovascular;  Laterality: N/A;   CORONARY STENT PLACEMENT     forein body removal     metal sharp removed from right leg   LAPAROTOMY N/A 02/01/2024   Procedure: LAPAROTOMY, EXPLORATORY;  Surgeon: Polly Cordella LABOR, MD;  Location: [facility] OR;  Service: General;  Laterality: N/A;  POSSIBLE OSTOMY   LEFT HEART CATH AND CORONARY ANGIOGRAPHY N/A 11/15/2018   Procedure: LEFT HEART CATH AND CORONARY ANGIOGRAPHY;  Surgeon: Mady Bruckner, MD;  Location: [facility] INV[facility] LAB;  Service: Cardiovascular;  Laterality: N/A;   LEFT HEART CATH AND CORONARY ANGIOGRAPHY N/A 07/15/2021   Procedure: LEFT HEART CATH AND CORONARY ANGIOGRAPHY;  Surgeon: Court Dorn JINNY, MD;  [facility]: [facility];  Service: Cardiovascular;  Laterality: N/A;   MANDIBULAR HARDWARE REMOVAL Bilateral 04/23/2020  Procedure: MANDIBULAR HARDWARE REMOVAL;  Surgeon: Jesus Oliphant, MD;  Location: [facility];  Service: ENT;  Laterality: Bilateral;   ORIF MANDIBULAR FRACTURE N/A 03/14/2020   Procedure: OPEN REDUCTION INTERNAL FIXATION (ORIF) MID  FACE FRACTURE, MANDIBULAR FIXATION MODIFIED ARCH BARS;  Surgeon: Jesus Oliphant, MD;  Location[facility] OR;  Service: ENT;  Laterality: N/A;   PARTIAL COLECTOMY N/A 02/01/2024   Procedure: COLECTOMY, PARTIAL;  Surgeon: Polly Cordella LABOR, MD;  Location: [facility] OR;  Service: General;  Laterality: N/A;   sigmoid colon   SCAR REVISION N/A 04/23/2020   Procedure: SCAR REVISION/ Upper Lip Repair;  Surgeon: Jesus Oliphant, MD;  Location: [facility];  Service: ENT;  Laterality: N/A;    Family History  Problem Relation Age of Onset   Diabetes Mother    Heart disease Mother    Heart disease Father     Social History   Socioeconomic History   Marital status: Divorced    Spouse name: Not on file   Number of children: 2   Years of education: 12   Highest education level: 12th grade  Occupational History   Occupation: retired    Comment: farmer  Tobacco Use   Smoking status: Former    Types: Cigarettes    Passive exposure: Past   Smokeless tobacco: Never  Vaping Use   Vaping status: Never Used  Substance and Sexual Activity   Alcohol use: Yes    Alcohol/week: 21.0 standard drinks of alcohol    Types: 21 Cans of beer per week    Comment: 2 beers daily   Drug use: No   Sexual activity: Not on file  Other Topics Concern   Not on file  Social History Narrative   [name] has a restraining order against one of his sons.   Social Drivers of Corporate investment banker Strain: Low Risk  (02/16/2024)   Overall Financial Resource Strain (CARDIA)    Difficulty of Paying Living Expenses: Not hard at all  Food Insecurity: No Food Insecurity (02/22/2024)   Hunger Vital Sign    Worried About Running Out of Food in the Last Year: Never true    Ran Out of Food in the Last Year: Never true  Transportation Needs: No Transportation Needs (02/22/2024)   PRAPARE - Administrator, Civil Service (Medical): No    Lack of Transportation (Non-Medical): No  Physical Activity: Unknown (02/16/2024)   Exercise Vital Sign    Days of Exercise per Week: 0 days    Minutes of Exercise per Session: Not on file  Stress: Stress Concern Present (02/22/2024)   [employer] of Occupational Health - Occupational Stress Questionnaire    Feeling of Stress : To some extent  Social  Connections: Moderately Integrated (02/16/2024)   Social Connection and Isolation Panel    Frequency of Communication with Friends and Family: More than three times a week    Frequency of Social Gatherings with Friends and Family: More than three times a week    Attends Religious Services: More than 4 times per year    Active Member of [employer] or Organizations: Yes    Attends Banker Meetings: More than 4 times per year    Marital Status: Divorced  Intimate Partner Violence: Not At Risk (02/22/2024)   Humiliation, Afraid, Rape, and Kick questionnaire    Fear of Current or Ex-Partner: No    Emotionally Abused: No    Physically Abused: No    Sexually  Abused: No    Allergies  Allergen Reactions   Carvedilol  Diarrhea   Lipitor [Atorvastatin] Other (See Comments)    Makes pt feel funky   Plavix  [Clopidogrel ] Rash    Current Outpatient Medications  Medication Sig Dispense Refill   albuterol  (VENTOLIN  HFA) 108 (90 Base) MCG/ACT inhaler Inhale 2 puffs into the lungs every 6 (six) hours as needed for wheezing (cough). 8 g 2   apixaban  (ELIQUIS ) 5 MG TABS tablet Take 1 tablet (5 mg total) by mouth 2 (two) times daily. 60 tablet 0   capecitabine (XELODA) 500 MG tablet Take 3 tablets (1,500 mg total) by mouth 2 (two) times daily after a meal. Take for 14 days, the hold for 7 days, Repeat every 21 days. 84 tablet 0   cholecalciferol  (VITAMIN D3) 25 MCG (1000 UNIT) tablet Take 1,000 Units by mouth daily.      ketoconazole (NIZORAL) 2 % cream Apply 1 Application topically 2 (two) times daily.     metoprolol  tartrate (LOPRESSOR ) 25 MG tablet Take 0.5 tablets (12.5 mg total) by mouth 2 (two) times daily. 90 tablet 3   nitroGLYCERIN  (NITROSTAT ) 0.4 MG SL tablet Place 1 tablet (0.4 mg total) under the tongue every 5 (five) minutes as needed for chest pain. 10 tablet 2   polyethylene glycol powder (GLYCOLAX /MIRALAX ) 17 GM/SCOOP powder Take 17 g by mouth daily. 476 g 0   rosuvastatin  (CRESTOR )  20 MG tablet Take 1 tablet (20 mg total) by mouth daily. 90 tablet 3   oxyCODONE  (OXY IR/ROXICODONE ) 5 MG immediate release tablet Take 1-2 tablets (5-10 mg total) by mouth every 6 (six) hours as needed for severe pain (pain score 7-10). (Patient not taking: Reported on 03/08/2024) 20 tablet 0   No current facility-administered medications for this visit.    PHYSICAL EXAM Vitals:   03/08/24 1358  BP: 131/77  Pulse: 81  SpO2: 98%  Weight: 135 lb 12.8 oz (61.6 kg)  Height: 5' 6 (1.676 m)    Elderly man in no distress Regular rate and rhythm Unlabored breathing No palpable pedal pulses  PERTINENT LABORATORY AND RADIOLOGIC DATA  Most recent CBC    Latest Ref Rng & Units 03/07/2024    1:50 PM 02/24/2024   12:14 PM 02/09/2024    5:38 AM  CBC  WBC 4.0 - 10.5 K/uL 6.9  6.9  8.1   Hemoglobin 13.0 - 17.0 g/dL 87.8  87.8  87.2   Hematocrit 39.0 - 52.0 % 36.1  38.0  38.3   Platelets 150 - 400 K/uL 179  235  406      Most recent CMP    Latest Ref Rng & Units 03/07/2024    1:50 PM 02/24/2024   12:14 PM 02/09/2024    5:38 AM  CMP  Glucose 70 - 99 mg/dL 67  87  97   BUN 8 - 23 mg/dL 18  17  24    Creatinine 0.61 - 1.24 mg/dL 9.35  9.33  9.15   Sodium 135 - 145 mmol/L 139  139  136   Potassium 3.5 - 5.1 mmol/L 4.3  4.6  4.6   Chloride 98 - 111 mmol/L 105  102  108   CO2 22 - 32 mmol/L 25  24  23    Calcium  8.9 - 10.3 mg/dL 9.4  9.2  8.5   Total Protein 6.5 - 8.1 g/dL 6.3  5.6    Total Bilirubin 0.0 - 1.2 mg/dL 0.3  0.3    Alkaline Phos 38 -  126 U/L 69  67    AST 15 - 41 U/L 26  19    ALT 0 - 44 U/L 21  18      Renal function Estimated Creatinine Clearance: 66.3 mL/min (by C-G formula based on SCr of 0.64 mg/dL).  Hgb A1c MFr Bld (%)  Date Value  07/13/2021 5.2    LDL Chol Calc (NIH)  Date Value Ref Range Status  02/24/2024 83 0 - 99 mg/dL Final     +-------+-----------+-----------+------------+------------+  ABI/TBIToday's ABIToday's TBIPrevious ABIPrevious TBI   +-------+-----------+-----------+------------+------------+  Right 0.93       0.77                                 +-------+-----------+-----------+------------+------------+  Left  0.57       0.63                                 +-------+-----------+-----------+------------+------------+   CT angiogram of abdomen / pelvis:  IMPRESSION: Vascular:   1. No evidence of thoracoabdominal aortic aneurysm or dissection. 2. No evidence of pulmonary embolus. 3. Aortic Atherosclerosis (ICD10-I70.0). Coronary artery atherosclerosis. 4. 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 5. 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 6. Extensive atherosclerosis of the right iliac system, with stenosis estimated at 50-70% throughout the right external iliac artery.   Nonvascular:   1. Segmental concentric mural thickening of the mid sigmoid colon, with associated contained perforation. Findings are compatible with perforated diverticulitis and diverticular abscess, though close follow-up is recommended to document resolution and exclude underlying neoplasm. 2. Bilateral bronchial wall thickening, greatest in the lower lobes, consistent with bronchitis or reactive airway disease. No acute airspace disease. 3.  Emphysema (ICD10-J43.9). 4. Moderate retained stool throughout the colon compatible with constipation. No obstruction or ileus. 5. Small bilateral fat containing inguinal hernias. 6. Multiple pulmonary nodules. Most significant: Right solid pulmonary nodule within the upper lobe measuring 4 mm. Per Fleischner Society Guidelines,if patient is low risk for malignancy, no routine follow-up imaging is recommended. If patient is high risk for malignancy, a non-contrast Chest CT at 12 months is optional.  If performed and the nodule is stable at 12 months, no further follow-up is recommended. These guidelines do not apply to immunocompromised patients and patients with cancer. Follow up in patients with significant comorbidities as clinically warranted. For lung cancer screening, adhere to Lung-RADS guidelines. Reference: Radiology. 2017; 284(1):228-43.   Critical Value/emergent results were called by telephone at the time of interpretation on 01/26/2024 at 3:09 pm to provider [facility] , who verbally acknowledged these results.    Debby SAILOR. Magda, MD FACS [facility] Phone Number: [phone] 03/08/2024 4:02 PM   Total time spent on preparing this encounter including chart review, data review, collecting history, examining the patient, and coordinating care: 45 minutes  Portions of this report may have been transcribed using voice recognition software.  Every effort has been made to ensure accuracy; however, inadvertent computerized transcription errors may still be present.

## 2024-03-07 NOTE — Progress Notes (Signed)
 Nutrition  Patient on RD's schedule for today at [facility].  Patient scheduled for labs and see NP at [facility] today. We have RD staff at [facility] location today.   Called patient and he said he called and left a message saying that he was not coming to nutrition appointment at [facility] today.  He said he left a message on voicemail about this.  He is agreeable to seeing RD that is at the [facility] location today after labs, NP visit.  He is appreciative of call.    Sebastin Perlmutter B. Dasie SOLON, CSO, LDN Registered Dietitian [phone]

## 2024-03-07 NOTE — Progress Notes (Signed)
  [facility] OFFICE PROGRESS NOTE   Diagnosis: Colon cancer  INTERVAL HISTORY:   Mr. Jedlicka returns as scheduled.  Colostomy functioning normally.  He has a good appetite.  He denies pain.  No nausea or vomiting.  Objective:  Vital signs in last 24 hours:  Blood pressure 120/61, pulse 78, temperature 98.2 F (36.8 C), temperature source Temporal, resp. rate 18, height 5' 6 (1.676 m), weight 135 lb 12.8 oz (61.6 kg), SpO2 99%.     Resp: Distant breath sounds.  No respiratory distress. Cardio: Regular rate and rhythm. GI: Healed midline surgical incision.  Left abdomen colostomy. Vascular: No leg edema.    Lab Results:  Lab Results  Component Value Date   WBC 6.9 03/07/2024   HGB 12.1 (L) 03/07/2024   HCT 36.1 (L) 03/07/2024   MCV 86.6 03/07/2024   PLT 179 03/07/2024   NEUTROABS 4.7 03/07/2024    Imaging:  No results found.  Medications: I have reviewed the patient's current medications.  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), status post a sigmoidectomy and end colostomy 02/01/2024 0/11 nodes, positive as enteric and distal margin, macroscopic tumor perforation-carcinoma involves distal disrupted segment CT angio chest/abdomen/pelvis 01/26/2024: Severe emphysema, segmental circumferential sigmoid thickening with associated contained perforation/abscess no adenopathy CT abdomen/pelvis 01/29/2024: Persistent perforated sigmoid diverticulitis with 2 adjacent fluid collections, 1 containing gas CT chest 02/02/2024: Tiny bilateral noncalcified pulmonary nodules nonspecific severe emphysema Cycle 1 capecitabine anticipated start date 03/14/2024 COPD CAD CHF, possible early LV thrombus on echocardiogram 01/28/2024, LVEF 25-30%-apixaban  Hypertension Hyperlipidemia Pulmonary nodules-nonspecific pulmonary nodules on CT 01/26/2024 and 02/02/2024  Disposition: Mr. Meininger appears stable.  His case was presented at the GI tumor conference.   Recommendation is for concurrent radiation/chemotherapy due to the positive surgical margin plus adjuvant systemic therapy.  We reviewed this recommendation with Mr. Falero at today's visit.  He is in agreement.  Referral placed to radiation oncology.  We discussed potential side effects associated with capecitabine including bone marrow toxicity, nausea, mouth sores, diarrhea, hand-foot syndrome, increased sensitivity to sun, rash.  He was provided with printed information.  We discussed sequencing of adjuvant therapy.  Plan at present is for 2-3 cycles of capecitabine, then radiation/capecitabine, then 2-3 additional cycles of capecitabine.  Anticipate start date cycle 1 capecitabine 03/14/2024.  We will see him in follow-up 03/29/2024.   Patient seen with Dr. Cloretta.    Olam Ned ANP/GNP-BC   03/07/2024  2:10 PM  This was a shared visit with Olam Ned.  Mr. Bisson was interviewed and examined.  His case was presented at the GI tumor conference on 03/02/2024.  The group recommendation is to proceed with concurrent capecitabine and radiation due to the positive surgical margins.  I communicated with Dr. Polly.  He indicates there is no option for repeat surgery in order to obtain a negative margin.  The plan is to begin adjuvant capecitabine to be followed by concurrent capecitabine and radiation.  We reviewed potential toxicities associated with capecitabine.  He will contact us  for diarrhea or hand/foot symptoms.  We discussed the potential for hematologic toxicity, infection, and bleeding.  He agrees to proceed.  The plan is to begin adjuvant capecitabine on 03/14/2024.  A prescription for capecitabine was entered today.  Arvella Cloretta, MD

## 2024-03-08 ENCOUNTER — Telehealth: Payer: Self-pay | Admitting: Pharmacist

## 2024-03-08 ENCOUNTER — Other Ambulatory Visit: Payer: Self-pay | Admitting: Pharmacy Technician

## 2024-03-08 ENCOUNTER — Ambulatory Visit (INDEPENDENT_AMBULATORY_CARE_PROVIDER_SITE_OTHER): Admitting: Vascular Surgery

## 2024-03-08 ENCOUNTER — Other Ambulatory Visit (HOSPITAL_COMMUNITY): Payer: Self-pay

## 2024-03-08 ENCOUNTER — Encounter: Payer: Self-pay | Admitting: Vascular Surgery

## 2024-03-08 ENCOUNTER — Other Ambulatory Visit: Payer: Self-pay

## 2024-03-08 ENCOUNTER — Telehealth: Payer: Self-pay | Admitting: Pharmacy Technician

## 2024-03-08 VITALS — BP 131/77 | HR 81 | Ht 66.0 in | Wt 135.8 lb

## 2024-03-08 DIAGNOSIS — I739 Peripheral vascular disease, unspecified: Secondary | ICD-10-CM

## 2024-03-08 DIAGNOSIS — C187 Malignant neoplasm of sigmoid colon: Secondary | ICD-10-CM

## 2024-03-08 MED ORDER — CAPECITABINE 500 MG PO TABS
1500.0000 mg | ORAL_TABLET | Freq: Two times a day (BID) | ORAL | 0 refills | Status: DC
  Filled 2024-03-08: qty 84, 21d supply, fill #0

## 2024-03-08 NOTE — Telephone Encounter (Signed)
 Clinical Pharmacist Practitioner Encounter   Received new prescription for Xeloda (capecitabine) for the adjuvant treatment of stage II colon cancer, planned duration of 2-3 cycles of capecitabine, then radiation/capecitabine, then 2-3 additional cycles of capecitabine. Planned start 03/14/24.   CMP from 03/07/24 assessed, no relevant lab abnormalities. Prescription dose and frequency assessed.   Current medication list in Epic reviewed, no DDIs with capecitabine identified.   Evaluated chart and no patient barriers to medication adherence identified.   Prescription has been e-scribed to the [facility] for benefits analysis and approval.  Oral Oncology Clinic will continue to follow for insurance authorization, copayment issues, initial counseling and start date.  Patient agreed to treatment on 03/07/24 per MD/NP documentation.  [name] [name], PharmD, BCOP, CPP Hematology/Oncology Clinical Pharmacist [facility] Oral Chemotherapy Navigation Clinic [phone]  03/08/2024 9:06 AM

## 2024-03-08 NOTE — Telephone Encounter (Signed)
 Oral Oncology Patient Advocate Encounter  After completing a benefits investigation, prior authorization for Xeloda is not required at this time through [facility].  Patient's copay is $32.86.     [place]o [place] (Patty) Chet Burnet, CPhT  [facility], [place], [place], [place] Oral Chemotherapy Patient Advocate Phone: [phone]  Fax: [phone]

## 2024-03-08 NOTE — Progress Notes (Signed)
 Specialty Pharmacy Initial Fill Coordination Note  [name] [name]. is a 78 y.o. male contacted today regarding refills of specialty medication(s) Capecitabine (XELODA) .  Patient requested Delivery  on 03/10/24  to verified address [address] [place] [address]   Medication will be filled on 06/25.   Patient is aware of $32.86 copayment.   Neoma Uhrich (Patty) Chet Burnet, CPhT  [facility], [place], [place], [place] Oral Chemotherapy Patient Advocate Phone: [phone]  Fax: [phone]

## 2024-03-08 NOTE — Telephone Encounter (Signed)
 Clinical Pharmacist Practitioner Encounter   [facility] (Specialty) will deliver medication to patient on 03/10/24. He knows not to start until 03/14/24.   Patient Education I spoke with patient for overview of new oral chemotherapy medication: Xeloda (capecitabine) for the adjuvant treatment of stage II colon cancer, planned duration of 2-3 cycles of capecitabine, then radiation/capecitabine, then 2-3 additional cycles of capecitabine. Planned start 03/14/24.   Counseled patient on administration, dosing, side effects, monitoring, drug-food interactions, safe handling, storage, and disposal. Patient will take 3 tablets (1,500 mg total) by mouth 2 (two) times daily after a meal. Take for 14 days, the hold for 7 days, Repeat every 21 days.   Side effects include but not limited to: diarrhea, hand-foot syndrome, mouth sores, edema, decreased wbc, fatigue, N/V Diarrhea: Patient knows to use loperamide as needed and call the office if they are having 4 or more loose stools per day. Hand-foot syndrome: Recommended the use of [place] Smooth Extra Care 20. Patient knows to report any skin changes they notice.  Mouth sores: Patient knows to request magic mouthwash if needed.    Reviewed with patient importance of keeping a medication schedule and plan for any missed doses.  After discussion with patient no patient barriers to medication adherence identified.   [name] [name] voiced understanding and appreciation. All questions answered. Medication handout provided.  Provided patient with Oral Chemotherapy Navigation Clinic phone number. Patient knows to call the office with questions or concerns. Oral Chemotherapy Navigation Clinic will continue to follow.  [name] [name], PharmD, BCOP, CPP Hematology/Oncology Clinical Pharmacist [facility] Oral Chemotherapy Navigation Clinic [phone]  03/08/2024 12:27 PM

## 2024-03-08 NOTE — Telephone Encounter (Addendum)
 Patient successfully OnBoarded and drug education provided by pharmacist. Medication scheduled to be shipped on 06/25 for delivery on 06/26 from [facility] to patient's address. Patient also knows to call me at [phone] with any questions or concerns regarding receiving medication or if there is any unexpected change in co-pay.    Jeromy Borcherding (Patty) Chet Burnet, CPhT  [facility], [place], [place], [place] Oral Chemotherapy Patient Advocate Phone: [phone]  Fax: [phone]

## 2024-03-08 NOTE — Progress Notes (Signed)
 Patient education documented in EPIC note on 03/08/24.

## 2024-03-09 ENCOUNTER — Telehealth: Payer: Self-pay | Admitting: Radiation Oncology

## 2024-03-09 ENCOUNTER — Other Ambulatory Visit: Payer: Self-pay

## 2024-03-09 DIAGNOSIS — I739 Peripheral vascular disease, unspecified: Secondary | ICD-10-CM

## 2024-03-09 NOTE — Telephone Encounter (Signed)
 Called and spoke w patient about blood thinners.  He has a supply of Eliquis  and is continuing to use that up.  Once he is down to 10 or less tablets, he will call back and request to be switched to Xarelto.  He voices frustration with complications and cost of care.

## 2024-03-09 NOTE — Telephone Encounter (Signed)
 Called pt to schedule consult with Dr. Dewey. Pt was offered 6/26 as next available, pt adamantly refused in-person, telephone, or mychart consult this day due to expecting a nurse in the window of 8:30am-2:00pm. Pt stated he would prefer 7/8 consult; agreed to 7/8@10 :30am. Pt also asked if it would be possible to schedule planning same day due to distance from home. Pt was advised I would reach out to [facility] [place] to make sure this would be possible and call back. Pt was agreeable to this.

## 2024-03-10 ENCOUNTER — Other Ambulatory Visit (INDEPENDENT_AMBULATORY_CARE_PROVIDER_SITE_OTHER): Admitting: Pharmacist

## 2024-03-10 DIAGNOSIS — I2510 Atherosclerotic heart disease of native coronary artery without angina pectoris: Secondary | ICD-10-CM | POA: Diagnosis not present

## 2024-03-10 DIAGNOSIS — N529 Male erectile dysfunction, unspecified: Secondary | ICD-10-CM | POA: Diagnosis not present

## 2024-03-10 DIAGNOSIS — I252 Old myocardial infarction: Secondary | ICD-10-CM | POA: Diagnosis not present

## 2024-03-10 DIAGNOSIS — E782 Mixed hyperlipidemia: Secondary | ICD-10-CM | POA: Diagnosis not present

## 2024-03-10 DIAGNOSIS — C189 Malignant neoplasm of colon, unspecified: Secondary | ICD-10-CM | POA: Diagnosis not present

## 2024-03-10 DIAGNOSIS — I5022 Chronic systolic (congestive) heart failure: Secondary | ICD-10-CM | POA: Diagnosis not present

## 2024-03-10 DIAGNOSIS — Z955 Presence of coronary angioplasty implant and graft: Secondary | ICD-10-CM | POA: Diagnosis not present

## 2024-03-10 DIAGNOSIS — Z87891 Personal history of nicotine dependence: Secondary | ICD-10-CM | POA: Diagnosis not present

## 2024-03-10 DIAGNOSIS — I110 Hypertensive heart disease with heart failure: Secondary | ICD-10-CM | POA: Diagnosis not present

## 2024-03-10 DIAGNOSIS — Z433 Encounter for attention to colostomy: Secondary | ICD-10-CM | POA: Diagnosis not present

## 2024-03-10 DIAGNOSIS — I5020 Unspecified systolic (congestive) heart failure: Secondary | ICD-10-CM

## 2024-03-10 DIAGNOSIS — Z48815 Encounter for surgical aftercare following surgery on the digestive system: Secondary | ICD-10-CM | POA: Diagnosis not present

## 2024-03-10 DIAGNOSIS — K5790 Diverticulosis of intestine, part unspecified, without perforation or abscess without bleeding: Secondary | ICD-10-CM | POA: Diagnosis not present

## 2024-03-10 DIAGNOSIS — Z6823 Body mass index (BMI) 23.0-23.9, adult: Secondary | ICD-10-CM | POA: Diagnosis not present

## 2024-03-10 DIAGNOSIS — Z7901 Long term (current) use of anticoagulants: Secondary | ICD-10-CM | POA: Diagnosis not present

## 2024-03-10 DIAGNOSIS — D630 Anemia in neoplastic disease: Secondary | ICD-10-CM | POA: Diagnosis not present

## 2024-03-10 DIAGNOSIS — I739 Peripheral vascular disease, unspecified: Secondary | ICD-10-CM | POA: Diagnosis not present

## 2024-03-10 NOTE — Progress Notes (Signed)
 03/10/2024 Name: [name] [name]. MRN: [id number] DOB: [birth year]  Chief Complaint  Patient presents with   Medication Management    [name] [name]. is a 78 y.o. year old male who presented for a telephone visit.   They were referred to the pharmacist by their PCP for assistance in managing medication access.    Subjective:  Care Team: Primary Care Provider: Severa Rock HERO, FNP   Medication Access/Adherence  Current Pharmacy:  [facility] [place], [place] - [address] [place] [address] Phone: [phone] Fax: [phone]  [name] [address] [place] [address] Phone: [phone] Fax: [phone]  [name] [name] - [facility] [address] [place] [address] Phone: [phone] Fax: [phone]   Patient reports affordability concerns with their medications: Yes --eliquis  Patient reports access/transportation concerns to their pharmacy: No  Patient reports adherence concerns with their medications:  No     Patient wanted to discuss medication assistance for Eliquis  vs Xarelto  He is following along with cardiology Plan has already been discussed   Objective:  Lab Results  Component Value Date   HGBA1C 5.2 07/13/2021    Lab Results  Component Value Date   CREATININE 0.64 03/07/2024   BUN 18 03/07/2024   NA 139 03/07/2024   [name] 4.3 03/07/2024   CL 105 03/07/2024   CO2 25 03/07/2024    Lab Results  Component Value Date   CHOL 139 02/24/2024   HDL 40 02/24/2024   LDLCALC 83 02/24/2024   TRIG 80 02/24/2024   CHOLHDL 3.5 02/24/2024    Medications Reviewed Today     Reviewed by [name] [name], RPH (Pharmacist) on 03/23/24 at 1251  Med List Status: <None>   Medication Order Taking? Sig Documenting Provider Last Dose Status Informant  albuterol  (VENTOLIN  HFA) 108 (90 Base) MCG/ACT inhaler [id number]  Inhale 2 puffs into the lungs  every 6 (six) hours as needed for wheezing (cough). [name] Rock HERO, FNP  Active Self, Pharmacy Records  capecitabine  (XELODA ) 500 MG tablet [id number]  Take 3 tablets (1,500 mg total) by mouth 2 (two) times daily after a meal. Take for 14 days, the hold for 7 days, Repeat every 21 days. [name] Arley NOVAK, MD  Active   cholecalciferol  (VITAMIN D3) 25 MCG (1000 UNIT) tablet [id number]  Take 1,000 Units by mouth daily.  [provider]  Active Self, Pharmacy Records  ketoconazole (NIZORAL) 2 % cream [id number]  Apply 1 Application topically 2 (two) times daily. [provider]  Active Self, Pharmacy Records  metoprolol  tartrate (LOPRESSOR ) 25 MG tablet [id number]  Take 0.5 tablets (12.5 mg total) by mouth 2 (two) times daily. [name] Hamilton T, PA-C  Active   nitroGLYCERIN  (NITROSTAT ) 0.4 MG SL tablet [id number]  Place 1 tablet (0.4 mg total) under the tongue every 5 (five) minutes as needed for chest pain.  Patient not taking: Reported on 03/22/2024   [name] Niki FALCON, FNP  Active Self, Pharmacy Records  ondansetron  (ZOFRAN ) 8 MG tablet [id number]  Take 1 tablet (8 mg total) by mouth every 8 (eight) hours as needed for nausea. [name] Arley NOVAK, MD  Active   oxyCODONE  (OXY IR/ROXICODONE ) 5 MG immediate release tablet [id number]  Take 1-2 tablets (5-10 mg total) by mouth every 6 (six) hours as needed for severe pain (pain score 7-10).  Patient not taking: Reported on 03/22/2024   Krishnan, Gokul, MD  Active   polyethylene glycol powder (GLYCOLAX /MIRALAX ) 17 GM/SCOOP powder [id number]  Take 17 g by mouth daily. Krishnan, Gokul, MD  Active   rivaroxaban  (XARELTO ) 20 MG TABS tablet [id number]  Take 1 tablet (20 mg total) by mouth daily with supper. Thukkani, Arun K, MD  Active   rosuvastatin  (CRESTOR ) 20 MG tablet [id number]  Take 1 tablet (20 mg total) by mouth daily. [name] Glendia DASEN, PA-C  Active   Med List Note [name] [name], RPH-CPP 03/08/24 9079): Xeloda  filled at [facility] (Specialty)               Assessment/Plan:   Discussed the transition to Xarelto  as recommended by cardiology (cheaper copay); patient aware of plan No patient assistance available for Xarelto  or Eliquis  at this time  Follow Up Plan: cardiology for Xarelto  ; patient has access to medication  [name] [name], PharmD, BCACP, CPP Clinical Pharmacist, [facility]

## 2024-03-10 NOTE — Progress Notes (Signed)
 GI Location of Tumor / Histology: Sigmoid Colon  [name] presented to the ER with complaints of rectal urgency and lower abdominal pain.  Imaging noted segmental thickening in the mid sigmoid with an associated contained perforation.   CT Chest 02/02/2024: Tiny bilateral noncalcified pulmonary nodules in the upper lobes measuring 0.3 cm, nonspecific and statistically most likely sequelae of prior infection or inflammation however small metastases not excluded. Attention on follow-up in the setting of newly diagnosed colon malignancy.  CT AP 01/29/2024: Previously demonstrated segmental wall thickening involving the mid to distal sigmoid colon with 2 adjacent fluid collections, 1 containing gas. There is rim enhancement surrounding the more inferior collection on the left. This collection measures 4.6 x 3.9 cm on image number 59/3, previously 4.4 x 2.9 cm.    Biopsies of Colon Mass 02/01/2024    Past/Anticipated interventions by surgeon, if any:  Dr. Polly -Laparotomy, exploratory, creation, colostomy (left abdomen); colectomy, partial 02/01/2024   Past/Anticipated interventions by medical oncology, if any:  Dr. 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  -Mr. Bruington has multiple comorbid conditions. I recommend adjuvant systemic therapy. We will most likely recommend capecitabine .  -He may be a candidate for additional colon resection at the time of ostomy takedown.     Weight changes, if any: He has lost a few pounds since he left the hospital.  Bowel/Bladder complaints, if any: Bowels are good.  No issues with colostomy. He reports urinary frequency, he has increased his fluid intake.  Nausea / Vomiting, if any: No  Pain issues, if any:  No  Any blood per rectum: None.      SAFETY ISSUES: Prior radiation? No Pacemaker/ICD?  No Possible current pregnancy? N/a Is the patient on methotrexate? No  Current Complaints/Details:

## 2024-03-11 ENCOUNTER — Other Ambulatory Visit: Payer: Self-pay | Admitting: *Deleted

## 2024-03-11 DIAGNOSIS — K5732 Diverticulitis of large intestine without perforation or abscess without bleeding: Secondary | ICD-10-CM

## 2024-03-11 DIAGNOSIS — Z87891 Personal history of nicotine dependence: Secondary | ICD-10-CM | POA: Diagnosis not present

## 2024-03-11 DIAGNOSIS — C189 Malignant neoplasm of colon, unspecified: Secondary | ICD-10-CM | POA: Diagnosis not present

## 2024-03-11 NOTE — Addendum Note (Signed)
 Addended by: Myking Sar A on: 03/11/2024 11:21 AM   Modules accepted: Orders

## 2024-03-11 NOTE — Patient Outreach (Signed)
 Transition of Care week 5  Visit Note  03/11/2024  Name: [name] [name]. MRN: [id number]          DOB: June 02, 1946  Situation: Patient enrolled in [facility] 30-day program. Visit completed with patient by telephone.   Background:    Past Medical History:  Diagnosis Date   Coronary artery disease    Erectile dysfunction    HFrEF (heart failure with reduced ejection fraction) (HCC)    Echocardiogram 07/14/21:  EF 25, global HK, inf HK worse, Gr 2 DD, normal RVSF, mild to mod MR, AV sclerosis w/o AS Echocardiogram 2/23: EF 25-30, global HK, GLS -12.9, normal RVSF, mild to mod MR, AV sclerosis w/o AS    History of hiatal hernia    a lone time ago   Hyperlipidemia    [place] fracture [facility]) 03/22/2020   Myocardial infarction (HCC)    x 2   Polio     Assessment: Patient Reported Symptoms: Cognitive Cognitive Status: Normal speech and language skills, Alert and oriented to person, place, and time, Able to follow simple commands      Neurological Neurological Review of Symptoms: No symptoms reported    HEENT HEENT Symptoms Reported: No symptoms reported      Cardiovascular Cardiovascular Symptoms Reported: No symptoms reported    Respiratory Respiratory Symptoms Reported: No symptoms reported    Endocrine Patient reports the following symptoms related to hypoglycemia or hyperglycemia : No symptoms reported    Gastrointestinal Gastrointestinal Symptoms Reported: No symptoms reported Gastrointestinal Conditions: Other Other Gastrointestinal Conditions: Diverticulitis Gastrointestinal Self-Management Outcome: 4 (good) Gastrointestinal Comment: PT continues changing ostomy wafer once weekly with assistance of home health, reinforced continuing to work with home health    Genitourinary Genitourinary Symptoms Reported: No symptoms reported    Integumentary Integumentary Symptoms Reported: No symptoms reported    Musculoskeletal Musculoskelatal Symptoms Reviewed: No symptoms  reported        Psychosocial Psychosocial Symptoms Reported: No symptoms reported         There were no vitals filed for this visit.  Medications Reviewed Today     Reviewed by Aura Mliss LABOR, RN (Registered Nurse) on 03/11/24 at 1035  Med List Status: <None>   Medication Order Taking? Sig Documenting Provider Last Dose Status Informant  albuterol  (VENTOLIN  HFA) 108 (90 Base) MCG/ACT inhaler [id number] Yes Inhale 2 puffs into the lungs every 6 (six) hours as needed for wheezing (cough). Severa Rock HERO, FNP  Active Self, Pharmacy Records  apixaban  (ELIQUIS ) 5 MG TABS tablet [id number] Yes Take 1 tablet (5 mg total) by mouth 2 (two) times daily. Thukkani, Arun K, MD  Active   capecitabine  (XELODA ) 500 MG tablet [id number] Yes Take 3 tablets (1,500 mg total) by mouth 2 (two) times daily after a meal. Take for 14 days, the hold for 7 days, Repeat every 21 days. Cloretta Arley NOVAK, MD  Active   cholecalciferol  (VITAMIN D3) 25 MCG (1000 UNIT) tablet [id number] Yes Take 1,000 Units by mouth daily.  [provider]  Active Self, Pharmacy Records  ketoconazole (NIZORAL) 2 % cream [id number] Yes Apply 1 Application topically 2 (two) times daily. [provider]  Active Self, Pharmacy Records  metoprolol  tartrate (LOPRESSOR ) 25 MG tablet [id number] Yes Take 0.5 tablets (12.5 mg total) by mouth 2 (two) times daily. Lelon Hamilton T, PA-C  Active   nitroGLYCERIN  (NITROSTAT ) 0.4 MG SL tablet [id number]  Place 1 tablet (0.4 mg total) under the tongue every 5 (five) minutes as needed for  chest pain.  Patient not taking: Reported on 03/11/2024   Merlynn Niki FALCON, FNP  Active Self, Pharmacy Records  oxyCODONE  (OXY IR/ROXICODONE ) 5 MG immediate release tablet [id number]  Take 1-2 tablets (5-10 mg total) by mouth every 6 (six) hours as needed for severe pain (pain score 7-10).  Patient not taking: Reported on 03/11/2024   Krishnan, Gokul, MD  Active   polyethylene glycol powder (GLYCOLAX /MIRALAX ) 17  GM/SCOOP powder [id number] Yes Take 17 g by mouth daily. Krishnan, Gokul, MD  Active   rosuvastatin  (CRESTOR ) 20 MG tablet [id number] Yes Take 1 tablet (20 mg total) by mouth daily. Lelon Glendia DASEN, PA-C  Active   Med List Note Teretha Renaee SAILOR, RPH-CPP 03/08/24 9079): Xeloda  filled at [facility] (Specialty)            Recommendation:   PCP Follow-up Specialty provider follow-up radiation oncology 7/8 @ 1030 am  Follow Up Plan:   Closing From:  Transitions of Care Program Transfer to longitudinal case management  Mliss Creed [facility], BSN RN Care Manager/ Transition of Care [place]/ [facility] [phone]

## 2024-03-11 NOTE — Patient Instructions (Signed)
 Visit Information  Thank you for taking time to visit with me today. Please don't hesitate to contact me if I can be of assistance to you before our next scheduled telephone appointment.  Our next appointment is no further scheduled appointments.   Care guide will outreach to schedule a telephone visit with case manager  Following is a copy of your care plan:   Goals Addressed             This Visit's Progress    COMPLETED: [facility]I Transitions of Care (TOC) Care Plan       Problems: Recent Hospitalization for treatment of Diverticulitis and wound care, colostomy Home Health services barrier: [facility] home [facility] has not seen pt yet, will do first visit 02/14/24, Limited social support: patient's son lives 35 miles away, is supportive but cannot see pt daily for wound care, No Hospital Follow Up Provider appointment patient declines for RN Care Manager to schedule a post hospital follow up appointment citing he has too many other appointments now and will schedule later, and No Specialist appointment patient does know when his appointment is with surgeon, RN Care Manager made follow up appointment with Dr. Nathalie for 02/22/24 @ 430 pm, pt verbalizes understanding Patient lives alone, states he saw wound care and ostomy change (wafer) done in hospital but he needs teaching/ reinforcement in the home, pt is able to empty ostomy bag, states he will wait for home health RN on 6/1 for assistance and talk to his son and maybe can assist  02/18/24- pt states home health nurse is seeing him at least 3 x per week, changing dressing to abdominal wound and no longer daily/ BID, pt states [facility] RN in process of teaching, providing reinforcement to change ostomy wafer and care of peristomal skin, no new concerns reported 02/25/24- pt reports wound is completely healed, surgeon released pt, home health continues and RN assisting with care of colostomy, changing wafer, etc.  Pt states he cannot afford Eliquis  @ 314$ per  month, reports has medication on hand but going forward this will be an issue, pharmacist is to outreach pt on 03/10/24 for medication assistance, pt verbalizes understanding. 03/04/24. Patient reports doing pretty good.  He admits some anxiousness with cancer center appointment next week.  Discussed positives and remaining optimistic about treatments.  Patient out of metoprolol  with no refills from transition of care pharmacy message to PCP and cardiology about metoprolol  and samples for Eliquis  as he may run out of Eliquis  prior to patient assistance being established.  Home health visiting weekly for colostomy change.  Patient has vascular appointment today and will inquire about Eliquis  samples while he is in the heart care building as well.   6/20 25 UPDATE 2:18 pm- Eliquis  samples ready for patient at PCP to pick up and metoprolol  refill sent to patient preferred pharmacy in [place].  Advised patient that Eliquis  samples ready at PCP and metoprolol  refill has been sent to his preferred pharmacy.  Patient states he will go pick medications.   03/11/24- pt reports everything going good  will see radiation oncology on 03/22/24 and would like longitudinal case manager to contact him after that date, pt did not pickup Eliquis  samples due to  I still have some in my bottle and they're probably gonna switch me to xarelto anyway  pt states he spoke with pharmacist on 03/10/24,  Pt picked up metoprolol  from pharmacy. No new concerns reported today  Goal:  Over the next 30 days, the  patient will not experience hospital readmission  Interventions: Diverticulitis/ colostomy/ wound care Reinforced signs/ symptoms of infection, reportable signs /symptoms Reinforced care of ostomy, importance of emptying regularly before bag gets too full, importance of changing immediately for any leakage, having son involved in changing wafer if needed and using a mirror to visualize,continue to work with home health Reviewed  upcoming scheduled appointments Ask patient to call RN Care Manager for any questions, pt has contact number Reviewed plan of care with pt including TOC case closure Order placed for transfer to longitudinal case management  Patient Self Care Activities:  Attend all scheduled provider appointments Call pharmacy for medication refills 3-7 days in advance of running out of medications Call provider office for new concerns or questions  Notify RN Care Manager of TOC call rescheduling needs Participate in Transition of Care Program/Attend TOC scheduled calls Perform all self care activities independently  Take medications as prescribed   Please work with home health RN on wound care and changing ostomy wafer Change ostomy wafer immediately if you have any leakage You will be contacted to schedule a telephone outreach with case manager TOC case closure  Plan:  The patient has been provided with contact information for the care management team and has been advised to call with any health related questions or concerns.         Patient verbalizes understanding of instructions and care plan provided today and agrees to view in MyChart. Active MyChart status and patient understanding of how to access instructions and care plan via MyChart confirmed with patient.     No further follow up required: TOC case closure  Please call the care guide team at [phone] if you need to cancel or reschedule your appointment.   Please call the Suicide and Crisis Lifeline: 988 call the [place]  National Suicide Prevention Lifeline: 940-038-0156 or TTY: 575-722-2118 TTY (615)877-4207) to talk to a trained counselor call 1-800-273-TALK (toll free, 24 hour hotline) go to [facility] [address], [place] [phone]) call the [facility] Crisis Line: (231)782-5251 call 911 if you are experiencing a Mental Health or Behavioral Health Crisis or need someone to  talk to.  Mliss Creed [facility], BSN RN Care Manager/ Transition of Care [place]/ [facility] [phone]

## 2024-03-12 DIAGNOSIS — C187 Malignant neoplasm of sigmoid colon: Secondary | ICD-10-CM | POA: Diagnosis not present

## 2024-03-14 ENCOUNTER — Telehealth: Payer: Self-pay

## 2024-03-14 NOTE — Progress Notes (Signed)
 Complex Care Management Note  Care Guide Note 03/14/2024 Name: [name] [name]. MRN: [id number] DOB: 1945-10-26  [name] [name]. is a 78 y.o. year old male who sees Rakes, Rock HERO, FNP for primary care. I reached out to [name] [name]. by phone today to offer complex care management services.  [name] [name] was given information about Complex Care Management services today including:   The Complex Care Management services include support from the care team which includes your Nurse Care Manager, Clinical Social Worker, or Pharmacist.  The Complex Care Management team is here to help remove barriers to the health concerns and goals most important to you. Complex Care Management services are voluntary, and the patient may decline or stop services at any time by request to their care team member.   Complex Care Management Consent Status: Patient agreed to services and verbal consent obtained.   Follow up plan:  Telephone appointment with complex care management team member scheduled for:  03/31/2024  Encounter Outcome:  Patient Scheduled  Jeoffrey Buffalo , RMA     [place]  [facility] Guide  Direct Dial: [phone]  Website: [email or web address]

## 2024-03-15 ENCOUNTER — Other Ambulatory Visit (HOSPITAL_COMMUNITY): Payer: Self-pay

## 2024-03-15 DIAGNOSIS — Z87891 Personal history of nicotine dependence: Secondary | ICD-10-CM | POA: Diagnosis not present

## 2024-03-15 DIAGNOSIS — Z6823 Body mass index (BMI) 23.0-23.9, adult: Secondary | ICD-10-CM | POA: Diagnosis not present

## 2024-03-15 DIAGNOSIS — I110 Hypertensive heart disease with heart failure: Secondary | ICD-10-CM | POA: Diagnosis not present

## 2024-03-15 DIAGNOSIS — D630 Anemia in neoplastic disease: Secondary | ICD-10-CM | POA: Diagnosis not present

## 2024-03-15 DIAGNOSIS — N529 Male erectile dysfunction, unspecified: Secondary | ICD-10-CM | POA: Diagnosis not present

## 2024-03-15 DIAGNOSIS — I252 Old myocardial infarction: Secondary | ICD-10-CM | POA: Diagnosis not present

## 2024-03-15 DIAGNOSIS — K5790 Diverticulosis of intestine, part unspecified, without perforation or abscess without bleeding: Secondary | ICD-10-CM | POA: Diagnosis not present

## 2024-03-15 DIAGNOSIS — Z7901 Long term (current) use of anticoagulants: Secondary | ICD-10-CM | POA: Diagnosis not present

## 2024-03-15 DIAGNOSIS — Z955 Presence of coronary angioplasty implant and graft: Secondary | ICD-10-CM | POA: Diagnosis not present

## 2024-03-15 DIAGNOSIS — I5022 Chronic systolic (congestive) heart failure: Secondary | ICD-10-CM | POA: Diagnosis not present

## 2024-03-15 DIAGNOSIS — C189 Malignant neoplasm of colon, unspecified: Secondary | ICD-10-CM | POA: Diagnosis not present

## 2024-03-15 DIAGNOSIS — I2510 Atherosclerotic heart disease of native coronary artery without angina pectoris: Secondary | ICD-10-CM | POA: Diagnosis not present

## 2024-03-15 DIAGNOSIS — Z48815 Encounter for surgical aftercare following surgery on the digestive system: Secondary | ICD-10-CM | POA: Diagnosis not present

## 2024-03-15 DIAGNOSIS — Z433 Encounter for attention to colostomy: Secondary | ICD-10-CM | POA: Diagnosis not present

## 2024-03-15 DIAGNOSIS — E782 Mixed hyperlipidemia: Secondary | ICD-10-CM | POA: Diagnosis not present

## 2024-03-15 DIAGNOSIS — I739 Peripheral vascular disease, unspecified: Secondary | ICD-10-CM | POA: Diagnosis not present

## 2024-03-15 NOTE — Telephone Encounter (Signed)
 Sent the patient another message

## 2024-03-16 ENCOUNTER — Telehealth: Payer: Self-pay | Admitting: Internal Medicine

## 2024-03-16 ENCOUNTER — Telehealth: Payer: Self-pay | Admitting: *Deleted

## 2024-03-16 DIAGNOSIS — C189 Malignant neoplasm of colon, unspecified: Secondary | ICD-10-CM | POA: Diagnosis not present

## 2024-03-16 DIAGNOSIS — E782 Mixed hyperlipidemia: Secondary | ICD-10-CM | POA: Diagnosis not present

## 2024-03-16 DIAGNOSIS — D630 Anemia in neoplastic disease: Secondary | ICD-10-CM | POA: Diagnosis not present

## 2024-03-16 DIAGNOSIS — Z955 Presence of coronary angioplasty implant and graft: Secondary | ICD-10-CM | POA: Diagnosis not present

## 2024-03-16 DIAGNOSIS — I110 Hypertensive heart disease with heart failure: Secondary | ICD-10-CM | POA: Diagnosis not present

## 2024-03-16 DIAGNOSIS — Z7901 Long term (current) use of anticoagulants: Secondary | ICD-10-CM | POA: Diagnosis not present

## 2024-03-16 DIAGNOSIS — N529 Male erectile dysfunction, unspecified: Secondary | ICD-10-CM | POA: Diagnosis not present

## 2024-03-16 DIAGNOSIS — I5022 Chronic systolic (congestive) heart failure: Secondary | ICD-10-CM | POA: Diagnosis not present

## 2024-03-16 DIAGNOSIS — Z48815 Encounter for surgical aftercare following surgery on the digestive system: Secondary | ICD-10-CM | POA: Diagnosis not present

## 2024-03-16 DIAGNOSIS — I739 Peripheral vascular disease, unspecified: Secondary | ICD-10-CM | POA: Diagnosis not present

## 2024-03-16 DIAGNOSIS — K5790 Diverticulosis of intestine, part unspecified, without perforation or abscess without bleeding: Secondary | ICD-10-CM | POA: Diagnosis not present

## 2024-03-16 DIAGNOSIS — Z87891 Personal history of nicotine dependence: Secondary | ICD-10-CM | POA: Diagnosis not present

## 2024-03-16 DIAGNOSIS — Z433 Encounter for attention to colostomy: Secondary | ICD-10-CM | POA: Diagnosis not present

## 2024-03-16 DIAGNOSIS — I252 Old myocardial infarction: Secondary | ICD-10-CM | POA: Diagnosis not present

## 2024-03-16 DIAGNOSIS — Z6823 Body mass index (BMI) 23.0-23.9, adult: Secondary | ICD-10-CM | POA: Diagnosis not present

## 2024-03-16 DIAGNOSIS — I2510 Atherosclerotic heart disease of native coronary artery without angina pectoris: Secondary | ICD-10-CM | POA: Diagnosis not present

## 2024-03-16 MED ORDER — ONDANSETRON HCL 8 MG PO TABS: 8.0000 mg | ORAL_TABLET | Freq: Three times a day (TID) | ORAL | 1 refills | Status: DC | PRN

## 2024-03-16 NOTE — Telephone Encounter (Signed)
 Called pt to f/u changing Eliquis  to a cheaper medication.    Per primary nurse message from 03/09/24:  Called and spoke w patient about blood thinners. He has a supply of Eliquis  and is continuing to use that up. Once he is down to 10 or less tablets, he will call back and request to be switched to Xarelto. He voices frustration with complications and cost of care.   Will send to MD and covering RN to f/u on switch from Eliquis  to Xarelto.  Pt has 4 days of Eliquis  left.

## 2024-03-16 NOTE — Telephone Encounter (Signed)
 Pt c/o medication issue:  1. Name of Medication: apixaban  (ELIQUIS ) 5 MG TABS tablet   2. How are you currently taking this medication (dosage and times per day)?   Take 1 tablet (5 mg total) by mouth 2 (two) times daily.    3. Are you having a reaction (difficulty breathing--STAT)?   4. What is your medication issue? Pt states that he has only 4 days of medication left. Pt would like to know if medication can be changed to medication that was dicussed prior to being prescribed Eliquis . Please advise

## 2024-03-16 NOTE — Telephone Encounter (Signed)
 [name] [name] left VM requesting script for Zofran . Script sent and he was notified by MyChart since he did not answer phone when RN called him back.

## 2024-03-17 ENCOUNTER — Ambulatory Visit

## 2024-03-17 ENCOUNTER — Other Ambulatory Visit (HOSPITAL_COMMUNITY): Payer: Self-pay

## 2024-03-17 ENCOUNTER — Telehealth: Payer: Self-pay | Admitting: Pharmacy Technician

## 2024-03-17 ENCOUNTER — Telehealth: Payer: Self-pay | Admitting: Internal Medicine

## 2024-03-17 DIAGNOSIS — Z48815 Encounter for surgical aftercare following surgery on the digestive system: Secondary | ICD-10-CM

## 2024-03-17 DIAGNOSIS — Z433 Encounter for attention to colostomy: Secondary | ICD-10-CM | POA: Diagnosis not present

## 2024-03-17 DIAGNOSIS — C189 Malignant neoplasm of colon, unspecified: Secondary | ICD-10-CM

## 2024-03-17 DIAGNOSIS — I252 Old myocardial infarction: Secondary | ICD-10-CM

## 2024-03-17 DIAGNOSIS — I739 Peripheral vascular disease, unspecified: Secondary | ICD-10-CM

## 2024-03-17 DIAGNOSIS — D630 Anemia in neoplastic disease: Secondary | ICD-10-CM

## 2024-03-17 DIAGNOSIS — N529 Male erectile dysfunction, unspecified: Secondary | ICD-10-CM

## 2024-03-17 DIAGNOSIS — I2510 Atherosclerotic heart disease of native coronary artery without angina pectoris: Secondary | ICD-10-CM

## 2024-03-17 DIAGNOSIS — I5022 Chronic systolic (congestive) heart failure: Secondary | ICD-10-CM

## 2024-03-17 DIAGNOSIS — K5790 Diverticulosis of intestine, part unspecified, without perforation or abscess without bleeding: Secondary | ICD-10-CM | POA: Diagnosis not present

## 2024-03-17 DIAGNOSIS — E782 Mixed hyperlipidemia: Secondary | ICD-10-CM

## 2024-03-17 DIAGNOSIS — I110 Hypertensive heart disease with heart failure: Secondary | ICD-10-CM

## 2024-03-17 MED ORDER — RIVAROXABAN 20 MG PO TABS: 20.0000 mg | ORAL_TABLET | Freq: Every day | ORAL | 5 refills | Status: DC

## 2024-03-17 NOTE — Telephone Encounter (Signed)
 Pt c/o medication issue:  1. Name of Medication:   rivaroxaban (XARELTO) 20 MG TABS tablet   2. How are you currently taking this medication (dosage and times per day)?   3. Are you having a reaction (difficulty breathing--STAT)?   4. What is your medication issue?   Patient stated he has a $321 deductible and he cannot afford this.  Patient wants a call back to discuss next steps.

## 2024-03-17 NOTE — Telephone Encounter (Signed)
 He should start Xarelto 12 hr after his last Eliquis  dose. Xarelto should be taken with a large meal every 24 hr  Xarelto dose is 20mg  daily. I sent rx to pharmacy and patient given instructions

## 2024-03-17 NOTE — Telephone Encounter (Signed)
 Pt c/o medication issue:  1. Name of Medication:   Xarelto   2. How are you currently taking this medication (dosage and times per day)?   Not started yet  3. Are you having a reaction (difficulty breathing--STAT)?   4. What is your medication issue?   Patient stated he is at his pharmacy now [facility] [place], [place] - [address] ) and is following up on getting the prescription for this medication.

## 2024-03-17 NOTE — Telephone Encounter (Signed)
 Patient states [employer] is supposed to be reaching out to our office and they think they can get it down from a tier 3 to a tier 2.  I suggested he get the 30 day trial card from his pharmacy today, he said they don't have one.  He will come into office Monday to get one here at our office.

## 2024-03-17 NOTE — Telephone Encounter (Signed)
 Routing to PharmD to change to Eliquis  to Xarelto.

## 2024-03-21 ENCOUNTER — Other Ambulatory Visit (HOSPITAL_COMMUNITY): Payer: Self-pay

## 2024-03-21 ENCOUNTER — Telehealth: Payer: Self-pay | Admitting: Pharmacy Technician

## 2024-03-21 NOTE — Telephone Encounter (Signed)
 Called and reviewed again with patient.  Today on the phone he goes from why can't I just take an aspirin  to the application asks too many questions to the pharmacy said after I spend a total of $375 my payment will be 40 something monthly.   He is going to stop by his pharmacy again to check this.  In regard to the application - he will see if any info from his pharmacy is needed and then will bring the application in and see if someone can help him complete it.  All this is if I can find it..  reviewed options such as warfarin which is free and pradaxa for $141 per Pharmacy Tech. ---------------------------------------------------------------------------------------------------  Glenice Krabbe, CPhT to Me  Pavero, Lonni, RPH  Taffy Lovey KIDD, CMA (Selected Message)     03/21/24  3:15 PM Hi, we did not receive the tier exception directly but the full denial is scanned in media. Eliquis  does not have any comparable alternative brand name formulary medications on a lower tier and thus does not meet the tier exception approval criteria. Unfortunately, tier exceptions are not approved for eliquis  and that is why we send the BMS applications or change the medication. I gave price quotes in another encounter. Warfarin would be free or generic pradaxa is 141.71 for 30 days. We sent him a eliquis  application previously. I called [phone] as the tier exception denial letter stated and the patient started this tier exception process and then the clinical staff at [facility] looked at the tier exception request before sending to us  and it was automatically denied due to eliquis  not meeting the tier exception approval criteria.

## 2024-03-21 NOTE — Telephone Encounter (Signed)
Tier exception denied.

## 2024-03-21 NOTE — Progress Notes (Signed)
 Radiation Oncology         [phone]) [phone] ________________________________  Name: [name] [name].        MRN: [id number]  Date of Service: 03/22/2024 DOB: 1946/01/02  RR:Mjxzd, Rock HERO, FNP  Cloretta Arley NOVAK, MD     REFERRING PHYSICIAN: Cloretta Arley NOVAK, MD   DIAGNOSIS: The encounter diagnosis was Cancer of sigmoid colon [facility]).   HISTORY OF PRESENT ILLNESS: [name] [name]. is a 78 y.o. male seen at the request of Dr. Cloretta for a diagnosis of adenocarcinoma of the sigmoid colon.  The patient was recently diagnosed with his cancer after undergoing an open sigmoid colectomy on 02/01/2024 initially thought to be treatment for diverticulitis.  It was apparent that there were concerns for malignancy during the procedure and a frozen section confirmed adenocarcinoma.  Final pathology confirmed the sigmoid biopsy showing colonic adenocarcinoma in the resection specimen containing colonic adenocarcinoma partially disrupting into pericolonic connective tissue and involving the disrupted portion of the specimen including mesenteric and distal margins.  11 lymph nodes were submitted and were negative for disease.  The additional specimen is called distal margin and second distal margin were both involved the proximal margin was negative.  His case was discussed in multidisciplinary GI oncology conference. Since no additional surgery has been recommended, we discussed chemotherapy followed by chemoradiation. He began xeloda  on 04/13/24 and will take each cycle 2 weeks on, one week off. He's seen today to discuss radiotherapy at the appropriate time.    PREVIOUS RADIATION THERAPY: No   PAST MEDICAL HISTORY:  Past Medical History:  Diagnosis Date   Coronary artery disease    Erectile dysfunction    HFrEF (heart failure with reduced ejection fraction) (HCC)    Echocardiogram 07/14/21:  EF 25, global HK, inf HK worse, Gr 2 DD, normal RVSF, mild to mod MR, AV sclerosis w/o AS Echocardiogram 2/23: EF  25-30, global HK, GLS -12.9, normal RVSF, mild to mod MR, AV sclerosis w/o AS    History of hiatal hernia    a lone time ago   Hyperlipidemia    [place] fracture [facility]) 03/22/2020   Myocardial infarction (HCC)    x 2   [place]        PAST SURGICAL HISTORY: Past Surgical History:  Procedure Laterality Date   APPENDECTOMY     CARDIAC CATHETERIZATION     has two stents placed   CERVICAL SPINE SURGERY     c3-4, 4-5,  5-6, 8 screws and plates   COLOSTOMY Left 02/01/2024   Procedure: CREATION, COLOSTOMY;  Surgeon: Polly Cordella LABOR, MD;  Location: [facility] OR;  Service: General;  Laterality: Left;   CORONARY STENT INTERVENTION N/A 07/15/2021   Procedure: CORONARY STENT INTERVENTION;  Surgeon: Court Dorn JINNY, MD;  Location: [facility] INVASIVE CV LAB;  Service: Cardiovascular;  Laterality: N/A;   CORONARY STENT PLACEMENT     forein body removal     metal sharp removed from right leg   LAPAROTOMY N/A 02/01/2024   Procedure: LAPAROTOMY, EXPLORATORY;  Surgeon: Polly Cordella LABOR, MD;  Location: [facility] OR;  Service: General;  Laterality: N/A;  POSSIBLE OSTOMY   LEFT HEART CATH AND CORONARY ANGIOGRAPHY N/A 11/15/2018   Procedure: LEFT HEART CATH AND CORONARY ANGIOGRAPHY;  Surgeon: Mady Bruckner, MD;  Location: [facility] INVASIVE CV LAB;  Service: Cardiovascular;  Laterality: N/A;   LEFT HEART CATH AND CORONARY ANGIOGRAPHY N/A 07/15/2021   Procedure: LEFT HEART CATH AND CORONARY ANGIOGRAPHY;  Surgeon: Court Dorn JINNY, MD;  Location: [facility] INV[facility] LAB;  Service: Cardiovascular;  Laterality: N/A;   MANDIBULAR HARDWARE REMOVAL Bilateral 04/23/2020   Procedure: MANDIBULAR HARDWARE REMOVAL;  Surgeon: Jesus Oliphant, MD;  Location: [facility];  Service: ENT;  Laterality: Bilateral;   ORIF MANDIBULAR FRACTURE N/A 03/14/2020   Procedure: OPEN REDUCTION INTERNAL FIXATION (ORIF) MID  FACE FRACTURE, MANDIBULAR FIXATION MODIFIED ARCH BARS;  Surgeon: Jesus Oliphant, MD;  Location: [facility] OR;  Service: ENT;  Laterality: N/A;    PARTIAL COLECTOMY N/A 02/01/2024   Procedure: COLECTOMY, PARTIAL;  Surgeon: Polly Cordella LABOR, MD;  Location: [facility] OR;  Service: General;  Laterality: N/A;  sigmoid colon   SCAR REVISION N/A 04/23/2020   Procedure: SCAR REVISION/ Upper Lip Repair;  Surgeon: Jesus Oliphant, MD;  Location: [facility];  Service: ENT;  Laterality: N/A;     FAMILY HISTORY:  Family History  Problem Relation Age of Onset   Diabetes Mother    Heart disease Mother    Heart disease Father      SOCIAL HISTORY:  reports that he has quit smoking. His smoking use included cigarettes. He has been exposed to tobacco smoke. He has never used smokeless tobacco. He reports current alcohol use of about 21.0 standard drinks of alcohol per week. He reports that he does not use drugs.  The patient is divorced and lives in [place]. He is retired from [employer] but enjoys hunting locally and down [place] with friends. He teaches IT consultant education as well.    ALLERGIES: Carvedilol , Lipitor [atorvastatin], and Plavix  [clopidogrel ]   MEDICATIONS:  Current Outpatient Medications  Medication Sig Dispense Refill   albuterol  (VENTOLIN  HFA) 108 (90 Base) MCG/ACT inhaler Inhale 2 puffs into the lungs every 6 (six) hours as needed for wheezing (cough). 8 g 2   capecitabine  (XELODA ) 500 MG tablet Take 3 tablets (1,500 mg total) by mouth 2 (two) times daily after a meal. Take for 14 days, the hold for 7 days, Repeat every 21 days. 84 tablet 0   cholecalciferol  (VITAMIN D3) 25 MCG (1000 UNIT) tablet Take 1,000 Units by mouth daily.      ketoconazole (NIZORAL) 2 % cream Apply 1 Application topically 2 (two) times daily.     metoprolol  tartrate (LOPRESSOR ) 25 MG tablet Take 0.5 tablets (12.5 mg total) by mouth 2 (two) times daily. 90 tablet 3   ondansetron  (ZOFRAN ) 8 MG tablet Take 1 tablet (8 mg total) by mouth every 8 (eight) hours as needed for nausea. 30 tablet 1   polyethylene glycol powder (GLYCOLAX /MIRALAX ) 17  GM/SCOOP powder Take 17 g by mouth daily. 476 g 0   rivaroxaban  (XARELTO ) 20 MG TABS tablet Take 1 tablet (20 mg total) by mouth daily with supper. 30 tablet 5   rosuvastatin  (CRESTOR ) 20 MG tablet Take 1 tablet (20 mg total) by mouth daily. 90 tablet 3   nitroGLYCERIN  (NITROSTAT ) 0.4 MG SL tablet Place 1 tablet (0.4 mg total) under the tongue every 5 (five) minutes as needed for chest pain. (Patient not taking: Reported on 03/22/2024) 10 tablet 2   oxyCODONE  (OXY IR/ROXICODONE ) 5 MG immediate release tablet Take 1-2 tablets (5-10 mg total) by mouth every 6 (six) hours as needed for severe pain (pain score 7-10). (Patient not taking: Reported on 03/22/2024) 20 tablet 0   No current facility-administered medications for this encounter.     REVIEW OF SYSTEMS: On review of systems, the patient reports that he is doing well overall. He is navigating his ostomy well  at this time. He denies any abdominal pain, nausea, or difficulty with fatigue. He has noticed sensitivity of his skin to the sun and dryness. No other complaints are verbalized.      PHYSICAL EXAM:  Wt Readings from Last 3 Encounters:  03/22/24 136 lb 9.6 oz (62 kg)  03/08/24 135 lb 12.8 oz (61.6 kg)  03/07/24 135 lb 12.8 oz (61.6 kg)   Temp Readings from Last 3 Encounters:  03/22/24 (!) 97.1 F (36.2 C) (Temporal)  03/07/24 98.2 F (36.8 C) (Temporal)  02/24/24 (!) 96 F (35.6 C)   BP Readings from Last 3 Encounters:  03/22/24 (!) 140/71  03/08/24 131/77  03/07/24 120/61   Pulse Readings from Last 3 Encounters:  03/22/24 63  03/08/24 81  03/07/24 78   Pain Assessment Pain Score: 0-No pain/10  In general this is a well appearing caucasian male in no acute distress. He's alert and oriented x4 and appropriate throughout the examination. Cardiopulmonary assessment is negative for acute distress and he exhibits normal effort.     ECOG = 1  0 - Asymptomatic (Fully active, able to carry on all predisease activities  without restriction)  1 - Symptomatic but completely ambulatory (Restricted in physically strenuous activity but ambulatory and able to carry out work of a light or sedentary nature. For example, light housework, office work)  2 - Symptomatic, <50% in bed during the day (Ambulatory and capable of all self care but unable to carry out any work activities. Up and about more than 50% of waking hours)  3 - Symptomatic, >50% in bed, but not bedbound (Capable of only limited self-care, confined to bed or chair 50% or more of waking hours)  4 - Bedbound (Completely disabled. Cannot carry on any self-care. Totally confined to bed or chair)  5 - Death   Raylene MM, Creech RH, Tormey DC, et al. [phone]). Toxicity and response criteria of the [facility]. Am. DOROTHA Bridges. Oncol. 5 (6): 649-55    LABORATORY DATA:  Lab Results  Component Value Date   WBC 6.9 03/07/2024   HGB 12.1 (L) 03/07/2024   HCT 36.1 (L) 03/07/2024   MCV 86.6 03/07/2024   PLT 179 03/07/2024   Lab Results  Component Value Date   NA 139 03/07/2024   K 4.3 03/07/2024   CL 105 03/07/2024   CO2 25 03/07/2024   Lab Results  Component Value Date   ALT 21 03/07/2024   AST 26 03/07/2024   ALKPHOS 69 03/07/2024   BILITOT 0.3 03/07/2024      RADIOGRAPHY: VAS US  ABI WITH/WO TBI Result Date: 03/04/2024  LOWER EXTREMITY DOPPLER STUDY Patient Name:  Niklaus J Lanuza JR.  Date of Exam:   03/04/2024 Medical Rec #: [id number]            Accession #:    [id number] Date of Birth: 10-30-1945            Patient Gender: M Patient Age:   26 years Exam Location:  [address] Procedure:      VAS US  ABI WITH/WO TBI Referring Phys: DEBBY ROBERTSON --------------------------------------------------------------------------------  Indications: Claudication, and peripheral artery disease. High Risk Factors: Hypertension, hyperlipidemia, past history of smoking,                    coronary artery disease. Other Factors: On Ct angio  done 01/26/24 showed 50% RCIA stenosis and 70-98% LCIA  stenosis. Patient states his left thigh hurts and burns whern he                walks up inclines and is relieved with rest.  Performing Technologist: Lewanda Palma RVT RDCS  Examination Guidelines: A complete evaluation includes at minimum, Doppler waveform signals and systolic blood pressure reading at the level of bilateral brachial, anterior tibial, and posterior tibial arteries, when vessel segments are accessible. Bilateral testing is considered an integral part of a complete examination. Photoelectric Plethysmograph (PPG) waveforms and toe systolic pressure readings are included as required and additional duplex testing as needed. Limited examinations for reoccurring indications may be performed as noted.  ABI Findings: +---------+------------------+-----+---------+--------+ Right    Rt Pressure (mmHg)IndexWaveform Comment  +---------+------------------+-----+---------+--------+ Brachial 129                    biphasic          +---------+------------------+-----+---------+--------+ ATA      110               0.85 triphasic         +---------+------------------+-----+---------+--------+ PTA      120               0.93 triphasic         +---------+------------------+-----+---------+--------+ Great Toe99                0.77 Abnormal          +---------+------------------+-----+---------+--------+ +---------+------------------+-----+-------------------+-------+ Left     Lt Pressure (mmHg)IndexWaveform           Comment +---------+------------------+-----+-------------------+-------+ Brachial 120                    biphasic                   +---------+------------------+-----+-------------------+-------+ ATA      68                0.53 dampened monophasic        +---------+------------------+-----+-------------------+-------+ PTA      73                0.57 monophasic                  +---------+------------------+-----+-------------------+-------+ Great Toe81                0.63 Abnormal                   +---------+------------------+-----+-------------------+-------+ +-------+-----------+-----------+------------+------------+ ABI/TBIToday's ABIToday's TBIPrevious ABIPrevious TBI +-------+-----------+-----------+------------+------------+ Right  0.93       0.77                                +-------+-----------+-----------+------------+------------+ Left   0.57       0.63                                +-------+-----------+-----------+------------+------------+   Summary: Right: Resting right ankle-brachial index indicates mild right lower extremity arterial disease. The right toe-brachial index is normal. Left: Resting left ankle-brachial index indicates moderate left lower extremity arterial disease. The left toe-brachial index is abnormal. *See table(s) above for measurements and observations.  Electronically signed by Norman Serve on 03/04/2024 at 1:44:51 PM.    Final        IMPRESSION/PLAN: 1. Stage IIB, pT4aN0M0, adenocarcinoma of the sigmoid colon with  positive distal margins and LVI. Dr. Dewey discusses the pathology findings and reviews the nature of locally advanced colon cancer. Dr. Dewey discusses that having positive margins increases the long term risks of local recurrence. It appears he is doing well since surgery and with his first week and half of chemotherapy. We discussed several cycles of xeloda  prior to a course of  chemoradiation.  We discussed the risks, benefits, short, and long term effects of radiotherapy, as well as the curative intent, and the patient is interested in proceeding at the appropriate time. Dr. Dewey discusses the delivery and logistics of radiotherapy and anticipates a course of 5 1/2 weeks of radiotherapy.  We will see him back about 2-3 weeks after his last cycle of xeloda .  2. Risks of pelvic floor dysfunction from  radiotherapy. We discussed the importance of evaluation with physical therapy prior to pelvic radiation. I offered a referral but the patient would like to hold off on this. We will offer again when he comes back for follow up.   In a visit lasting 60 minutes, greater than 50% of the time was spent face to face discussing the patient's condition, in preparation for the discussion, and coordinating the patient's care.   The above documentation reflects my direct findings during this shared patient visit. Please see the separate note by Dr. Dewey on this date for the remainder of the patient's plan of care.    Donald KYM Husband, [place]   **Disclaimer: This note was dictated with voice recognition software. Similar sounding words can inadvertently be transcribed and this note may contain transcription errors which may not have been corrected upon publication of note.**

## 2024-03-22 ENCOUNTER — Encounter: Payer: Self-pay | Admitting: Radiation Oncology

## 2024-03-22 ENCOUNTER — Telehealth: Payer: Self-pay | Admitting: Physician Assistant

## 2024-03-22 ENCOUNTER — Ambulatory Visit
Admission: RE | Admit: 2024-03-22 | Discharge: 2024-03-22 | Disposition: A | Discharge status: Home / Self Care | Source: Ambulatory Visit | Attending: Radiation Oncology | Admitting: Radiation Oncology

## 2024-03-22 ENCOUNTER — Ambulatory Visit: Admitting: Radiation Oncology

## 2024-03-22 VITALS — BP 140/71 | HR 63 | Temp 97.1°F | Resp 18 | Ht 66.0 in | Wt 136.6 lb

## 2024-03-22 DIAGNOSIS — Z7722 Contact with and (suspected) exposure to environmental tobacco smoke (acute) (chronic): Secondary | ICD-10-CM | POA: Diagnosis not present

## 2024-03-22 DIAGNOSIS — Z7901 Long term (current) use of anticoagulants: Secondary | ICD-10-CM | POA: Diagnosis not present

## 2024-03-22 DIAGNOSIS — I2510 Atherosclerotic heart disease of native coronary artery without angina pectoris: Secondary | ICD-10-CM | POA: Diagnosis not present

## 2024-03-22 DIAGNOSIS — Z79899 Other long term (current) drug therapy: Secondary | ICD-10-CM | POA: Insufficient documentation

## 2024-03-22 DIAGNOSIS — I252 Old myocardial infarction: Secondary | ICD-10-CM | POA: Insufficient documentation

## 2024-03-22 DIAGNOSIS — Z87891 Personal history of nicotine dependence: Secondary | ICD-10-CM | POA: Insufficient documentation

## 2024-03-22 DIAGNOSIS — I5022 Chronic systolic (congestive) heart failure: Secondary | ICD-10-CM | POA: Insufficient documentation

## 2024-03-22 DIAGNOSIS — E785 Hyperlipidemia, unspecified: Secondary | ICD-10-CM | POA: Insufficient documentation

## 2024-03-22 DIAGNOSIS — C187 Malignant neoplasm of sigmoid colon: Secondary | ICD-10-CM | POA: Diagnosis not present

## 2024-03-22 DIAGNOSIS — N529 Male erectile dysfunction, unspecified: Secondary | ICD-10-CM | POA: Insufficient documentation

## 2024-03-22 NOTE — Telephone Encounter (Signed)
 Pt dropped off [employer] Patient Assistance paperwork to be filled out.   Location: Higher education careers adviser

## 2024-03-23 ENCOUNTER — Telehealth: Payer: Self-pay | Admitting: Pharmacy Technician

## 2024-03-23 ENCOUNTER — Telehealth: Payer: Self-pay | Admitting: *Deleted

## 2024-03-23 MED ORDER — APIXABAN 5 MG PO TABS: 5.0000 mg | ORAL_TABLET | Freq: Two times a day (BID) | ORAL | 0 refills | Status: DC

## 2024-03-23 NOTE — Telephone Encounter (Signed)
 Medication name/dosage: Samples List: Eliquis  5 mg  Administration instructions: take 1 tablet twice a day   Reason for samples: Reason for samples: unable to afford medication  Ordering provider:  *Once above information entered, route the phone encounter to CV DIV MAG ST SAMPLES and send Teams message to team member assigned to Samples for the day.

## 2024-03-23 NOTE — Telephone Encounter (Signed)
 [employer] patient assistance paperwork was blank.  I have sent it back to the pt asking for him to fill in his information and provide any financial documents they are requesting and bring them back in for us  to complete for him.

## 2024-03-23 NOTE — Telephone Encounter (Signed)
Patient is calling back to speak to the nurse. Please advise

## 2024-03-23 NOTE — Telephone Encounter (Signed)
 Eliquis  5 mg by mouth twice daily sample for 14 days is ready. Patient is informed.

## 2024-03-23 NOTE — Telephone Encounter (Signed)
 Patient coming friday

## 2024-03-23 NOTE — Telephone Encounter (Signed)
 I still have the patient assistance app in my procession.

## 2024-03-24 DIAGNOSIS — N529 Male erectile dysfunction, unspecified: Secondary | ICD-10-CM | POA: Diagnosis not present

## 2024-03-24 DIAGNOSIS — Z7901 Long term (current) use of anticoagulants: Secondary | ICD-10-CM | POA: Diagnosis not present

## 2024-03-24 DIAGNOSIS — E782 Mixed hyperlipidemia: Secondary | ICD-10-CM | POA: Diagnosis not present

## 2024-03-24 DIAGNOSIS — C189 Malignant neoplasm of colon, unspecified: Secondary | ICD-10-CM | POA: Diagnosis not present

## 2024-03-24 DIAGNOSIS — Z433 Encounter for attention to colostomy: Secondary | ICD-10-CM | POA: Diagnosis not present

## 2024-03-24 DIAGNOSIS — Z48815 Encounter for surgical aftercare following surgery on the digestive system: Secondary | ICD-10-CM | POA: Diagnosis not present

## 2024-03-24 DIAGNOSIS — I739 Peripheral vascular disease, unspecified: Secondary | ICD-10-CM | POA: Diagnosis not present

## 2024-03-24 DIAGNOSIS — Z87891 Personal history of nicotine dependence: Secondary | ICD-10-CM | POA: Diagnosis not present

## 2024-03-24 DIAGNOSIS — Z955 Presence of coronary angioplasty implant and graft: Secondary | ICD-10-CM | POA: Diagnosis not present

## 2024-03-24 DIAGNOSIS — K5790 Diverticulosis of intestine, part unspecified, without perforation or abscess without bleeding: Secondary | ICD-10-CM | POA: Diagnosis not present

## 2024-03-24 DIAGNOSIS — I5022 Chronic systolic (congestive) heart failure: Secondary | ICD-10-CM | POA: Diagnosis not present

## 2024-03-24 DIAGNOSIS — Z6823 Body mass index (BMI) 23.0-23.9, adult: Secondary | ICD-10-CM | POA: Diagnosis not present

## 2024-03-24 DIAGNOSIS — I252 Old myocardial infarction: Secondary | ICD-10-CM | POA: Diagnosis not present

## 2024-03-24 DIAGNOSIS — D630 Anemia in neoplastic disease: Secondary | ICD-10-CM | POA: Diagnosis not present

## 2024-03-24 DIAGNOSIS — I110 Hypertensive heart disease with heart failure: Secondary | ICD-10-CM | POA: Diagnosis not present

## 2024-03-24 DIAGNOSIS — I2510 Atherosclerotic heart disease of native coronary artery without angina pectoris: Secondary | ICD-10-CM | POA: Diagnosis not present

## 2024-03-25 ENCOUNTER — Other Ambulatory Visit: Payer: Self-pay | Admitting: Oncology

## 2024-03-25 ENCOUNTER — Telehealth: Payer: Self-pay | Admitting: Pharmacy Technician

## 2024-03-25 ENCOUNTER — Other Ambulatory Visit (HOSPITAL_COMMUNITY): Payer: Self-pay

## 2024-03-25 ENCOUNTER — Other Ambulatory Visit: Payer: Self-pay

## 2024-03-25 DIAGNOSIS — C187 Malignant neoplasm of sigmoid colon: Secondary | ICD-10-CM

## 2024-03-25 MED ORDER — CAPECITABINE 500 MG PO TABS
1500.0000 mg | ORAL_TABLET | Freq: Two times a day (BID) | ORAL | 0 refills | Status: DC
  Filled 2024-03-28: qty 84, 14d supply, fill #0
  Filled 2024-03-29: qty 84, 21d supply, fill #0

## 2024-03-25 NOTE — Telephone Encounter (Signed)
 Forms have been printed and placed in provider box.

## 2024-03-25 NOTE — Telephone Encounter (Signed)
 Hi, scanned in media under ELIQUIS  [employer] BLANK PROVIDER FORM is the form for this assistance application. Can someone please get the provider to sign and fax back to [phone]? Thank you!           Oop, patient application and income scanned in media.

## 2024-03-25 NOTE — Telephone Encounter (Signed)
 Scott, would you mind filling this out when you are back in office since this was Dr. Alveta patient and you were the last to see him?

## 2024-03-28 ENCOUNTER — Other Ambulatory Visit (HOSPITAL_COMMUNITY): Payer: Self-pay

## 2024-03-28 ENCOUNTER — Telehealth: Payer: Self-pay | Admitting: Radiation Oncology

## 2024-03-28 ENCOUNTER — Other Ambulatory Visit: Payer: Self-pay

## 2024-03-28 NOTE — Telephone Encounter (Signed)
 See new encounter

## 2024-03-28 NOTE — Telephone Encounter (Signed)
 PAP: Application for Eliquis has been submitted to [employer] ([employer]), via fax

## 2024-03-28 NOTE — Telephone Encounter (Signed)
 Forms filled out and faxed to [phone].  Documents also scanned into media.

## 2024-03-28 NOTE — Telephone Encounter (Signed)
 Spoke to pt to schedule FUN/SIM with PA Perkins/Dr.Moody. Pt agreeable to appt times offered.

## 2024-03-28 NOTE — Telephone Encounter (Signed)
 Forms signed today. Glendia Ferrier, PA-C    03/28/2024 4:16 PM

## 2024-03-29 ENCOUNTER — Inpatient Hospital Stay: Attending: Oncology

## 2024-03-29 ENCOUNTER — Other Ambulatory Visit: Payer: Self-pay | Admitting: Pharmacy Technician

## 2024-03-29 ENCOUNTER — Other Ambulatory Visit (HOSPITAL_COMMUNITY): Payer: Self-pay

## 2024-03-29 ENCOUNTER — Encounter: Payer: Self-pay | Admitting: Nurse Practitioner

## 2024-03-29 ENCOUNTER — Other Ambulatory Visit: Payer: Self-pay

## 2024-03-29 ENCOUNTER — Inpatient Hospital Stay (HOSPITAL_BASED_OUTPATIENT_CLINIC_OR_DEPARTMENT_OTHER): Admitting: Nurse Practitioner

## 2024-03-29 VITALS — BP 118/61 | HR 60 | Temp 98.7°F | Resp 16 | Ht 66.0 in | Wt 136.8 lb

## 2024-03-29 DIAGNOSIS — J439 Emphysema, unspecified: Secondary | ICD-10-CM | POA: Insufficient documentation

## 2024-03-29 DIAGNOSIS — C187 Malignant neoplasm of sigmoid colon: Secondary | ICD-10-CM | POA: Diagnosis not present

## 2024-03-29 DIAGNOSIS — E785 Hyperlipidemia, unspecified: Secondary | ICD-10-CM | POA: Diagnosis not present

## 2024-03-29 DIAGNOSIS — I2510 Atherosclerotic heart disease of native coronary artery without angina pectoris: Secondary | ICD-10-CM | POA: Insufficient documentation

## 2024-03-29 DIAGNOSIS — Z9049 Acquired absence of other specified parts of digestive tract: Secondary | ICD-10-CM | POA: Diagnosis not present

## 2024-03-29 DIAGNOSIS — I509 Heart failure, unspecified: Secondary | ICD-10-CM | POA: Insufficient documentation

## 2024-03-29 DIAGNOSIS — R112 Nausea with vomiting, unspecified: Secondary | ICD-10-CM | POA: Diagnosis not present

## 2024-03-29 DIAGNOSIS — R918 Other nonspecific abnormal finding of lung field: Secondary | ICD-10-CM | POA: Diagnosis not present

## 2024-03-29 DIAGNOSIS — I110 Hypertensive heart disease with heart failure: Secondary | ICD-10-CM | POA: Insufficient documentation

## 2024-03-29 DIAGNOSIS — Z933 Colostomy status: Secondary | ICD-10-CM | POA: Diagnosis not present

## 2024-03-29 LAB — CMP (CANCER CENTER ONLY)
ALT: 28 U/L (ref 0–44)
AST: 31 U/L (ref 15–41)
Albumin: 4.1 g/dL (ref 3.5–5.0)
Alkaline Phosphatase: 65 U/L (ref 38–126)
Anion gap: 10 (ref 5–15)
BUN: 15 mg/dL (ref 8–23)
CO2: 26 mmol/L (ref 22–32)
Calcium: 9.3 mg/dL (ref 8.9–10.3)
Chloride: 104 mmol/L (ref 98–111)
Creatinine: 0.65 mg/dL (ref 0.61–1.24)
GFR, Estimated: >60 mL/min (ref >60)
Glucose, Bld: 86 mg/dL (ref 70–99)
Potassium: 4.2 mmol/L (ref 3.5–5.1)
Sodium: 140 mmol/L (ref 135–145)
Total Bilirubin: 0.5 mg/dL (ref 0.0–1.2)
Total Protein: 6.4 g/dL — ABNORMAL LOW (ref 6.5–8.1)

## 2024-03-29 LAB — CBC WITH DIFFERENTIAL (CANCER CENTER ONLY)
Abs Immature Granulocytes: 0.03 K/uL (ref 0.00–0.07)
Basophils Absolute: 0 K/uL (ref 0.0–0.1)
Basophils Relative: 1 %
Eosinophils Absolute: 0.2 K/uL (ref 0.0–0.5)
Eosinophils Relative: 3 %
HCT: 38.9 % — ABNORMAL LOW (ref 39.0–52.0)
Hemoglobin: 12.9 g/dL — ABNORMAL LOW (ref 13.0–17.0)
Immature Granulocytes: 1 %
Lymphocytes Relative: 22 %
Lymphs Abs: 1.2 K/uL (ref 0.7–4.0)
MCH: 29.7 pg (ref 26.0–34.0)
MCHC: 33.2 g/dL (ref 30.0–36.0)
MCV: 89.4 fL (ref 80.0–100.0)
Monocytes Absolute: 0.5 K/uL (ref 0.1–1.0)
Monocytes Relative: 10 %
Neutro Abs: 3.4 K/uL (ref 1.7–7.7)
Neutrophils Relative %: 63 %
Platelet Count: 179 K/uL (ref 150–400)
RBC: 4.35 MIL/uL (ref 4.22–5.81)
RDW: 17.4 % — ABNORMAL HIGH (ref 11.5–15.5)
WBC Count: 5.3 K/uL (ref 4.0–10.5)
nRBC: 0 % (ref 0.0–0.2)

## 2024-03-29 NOTE — Progress Notes (Signed)
 Specialty Pharmacy Refill Coordination Note  Mosi Hannold. is a 78 y.o. male contacted today regarding refills of specialty medication(s) Capecitabine  (XELODA )   Patient requested Delivery   Delivery date: 03/30/24   Verified address: [address] [place]   Medication will be filled on 03/29/24.

## 2024-03-29 NOTE — Progress Notes (Addendum)
  [name] [name] OFFICE PROGRESS NOTE   Diagnosis: Colon cancer  INTERVAL HISTORY:   Mr. [name] [name] returns as scheduled.  He completed cycle 1 capecitabine  beginning 03/14/2024.  He did have nausea/vomiting.  No mouth sores.  Some dryness on the lower lip.  No diarrhea.  Hands and feet are dry.  No pain or redness.  Objective:  Vital signs in last 24 hours:  Blood pressure 118/61, pulse 60, temperature 98.7 F (37.1 C), temperature source Temporal, resp. rate 16, height 5' 6 (1.676 m), weight 136 lb 12.8 oz (62.1 kg), SpO2 100%.    HEENT: No thrush or ulcers.  Lower lip is dry appearing.  No blister appearing lesions. Resp: Distant breath sounds.  No respiratory distress. Cardio: Regular rate and rhythm. GI: Left lower quadrant colostomy.  No hepatomegaly. Vascular: No leg edema. Skin: Palms and soles are dry appearing, no erythema.   Lab Results:  Lab Results  Component Value Date   WBC 5.3 03/29/2024   HGB 12.9 (L) 03/29/2024   HCT 38.9 (L) 03/29/2024   MCV 89.4 03/29/2024   PLT 179 03/29/2024   NEUTROABS 3.4 03/29/2024    Imaging:  No results found.  Medications: I have reviewed the patient's current medications.  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), status post a sigmoidectomy and end colostomy 02/01/2024 0/11 nodes, positive as enteric and distal margin, macroscopic tumor perforation-carcinoma involves distal disrupted segment CT angio chest/abdomen/pelvis 01/26/2024: Severe emphysema, segmental circumferential sigmoid thickening with associated contained perforation/abscess no adenopathy CT abdomen/pelvis 01/29/2024: Persistent perforated sigmoid diverticulitis with 2 adjacent fluid collections, 1 containing gas CT chest 02/02/2024: Tiny bilateral noncalcified pulmonary nodules nonspecific severe emphysema 03/07/2024-guardant reveal ctDNA not detected Cycle 1 capecitabine  03/14/2024 Cycle 2 capecitabine  04/04/2024 COPD CAD CHF,  possible early LV thrombus on echocardiogram 01/28/2024, LVEF 25-30%-apixaban  Hypertension Hyperlipidemia Pulmonary nodules-nonspecific pulmonary nodules on CT 01/26/2024 and 02/02/2024  Disposition: Mr. [name] [name] appears stable.  He has completed 1 cycle of adjuvant capecitabine .  He tolerated well.  Hands and feet are mildly dry appearing.  He will apply lotion liberally.  He understands to contact the office with mouth sores, diarrhea, progressive symptoms of hand-foot syndrome.  He will begin cycle 2 capecitabine  as planned 04/04/2024.  CBC and chemistry panel reviewed.  Labs adequate to begin capecitabine  as above.  He will return for follow-up in 3 weeks.  We are available to see him sooner if needed.    Olam Ned ANP/GNP-BC   03/29/2024  11:10 AM

## 2024-03-29 NOTE — Progress Notes (Signed)
 Specialty Pharmacy Ongoing Clinical Assessment Note  [name] [name]. is a 78 y.o. male who is being followed by the specialty pharmacy service for RxSp Oncology   Patient's specialty medication(s) reviewed today: Capecitabine  (XELODA )   Missed doses in the last 4 weeks: 0   Patient/Caregiver did not have any additional questions or concerns.   Therapeutic benefit summary: Patient is achieving benefit   Adverse events/side effects summary: Experienced adverse events/side effects   Patient's therapy is appropriate to: Continue    Goals Addressed             This Visit's Progress    Achieve a cure       Patient is on track. Patient will maintain adherence          Follow up: 3 months  Cung Masterson M Tam Savoia Specialty Pharmacist

## 2024-03-30 DIAGNOSIS — K5790 Diverticulosis of intestine, part unspecified, without perforation or abscess without bleeding: Secondary | ICD-10-CM | POA: Diagnosis not present

## 2024-03-30 DIAGNOSIS — Z7901 Long term (current) use of anticoagulants: Secondary | ICD-10-CM | POA: Diagnosis not present

## 2024-03-30 DIAGNOSIS — Z433 Encounter for attention to colostomy: Secondary | ICD-10-CM | POA: Diagnosis not present

## 2024-03-30 DIAGNOSIS — D630 Anemia in neoplastic disease: Secondary | ICD-10-CM | POA: Diagnosis not present

## 2024-03-30 DIAGNOSIS — E782 Mixed hyperlipidemia: Secondary | ICD-10-CM | POA: Diagnosis not present

## 2024-03-30 DIAGNOSIS — I110 Hypertensive heart disease with heart failure: Secondary | ICD-10-CM | POA: Diagnosis not present

## 2024-03-30 DIAGNOSIS — C189 Malignant neoplasm of colon, unspecified: Secondary | ICD-10-CM | POA: Diagnosis not present

## 2024-03-30 DIAGNOSIS — I5022 Chronic systolic (congestive) heart failure: Secondary | ICD-10-CM | POA: Diagnosis not present

## 2024-03-30 DIAGNOSIS — Z48815 Encounter for surgical aftercare following surgery on the digestive system: Secondary | ICD-10-CM | POA: Diagnosis not present

## 2024-03-30 DIAGNOSIS — I252 Old myocardial infarction: Secondary | ICD-10-CM | POA: Diagnosis not present

## 2024-03-30 DIAGNOSIS — Z955 Presence of coronary angioplasty implant and graft: Secondary | ICD-10-CM | POA: Diagnosis not present

## 2024-03-30 DIAGNOSIS — I2510 Atherosclerotic heart disease of native coronary artery without angina pectoris: Secondary | ICD-10-CM | POA: Diagnosis not present

## 2024-03-30 DIAGNOSIS — I739 Peripheral vascular disease, unspecified: Secondary | ICD-10-CM | POA: Diagnosis not present

## 2024-03-30 DIAGNOSIS — Z87891 Personal history of nicotine dependence: Secondary | ICD-10-CM | POA: Diagnosis not present

## 2024-03-30 DIAGNOSIS — N529 Male erectile dysfunction, unspecified: Secondary | ICD-10-CM | POA: Diagnosis not present

## 2024-03-30 DIAGNOSIS — Z6823 Body mass index (BMI) 23.0-23.9, adult: Secondary | ICD-10-CM | POA: Diagnosis not present

## 2024-03-30 NOTE — Telephone Encounter (Signed)
   [employer]ms asked for updated address with [address]th floor on it. Faxed 03/30/24

## 2024-03-31 ENCOUNTER — Encounter: Payer: Self-pay | Admitting: *Deleted

## 2024-03-31 ENCOUNTER — Telehealth: Payer: Self-pay | Admitting: *Deleted

## 2024-03-31 NOTE — Patient Instructions (Signed)
 [name] [name]. - I am sorry I was unable to reach you today for our scheduled appointment. I work with Severa Rock HERO, FNP and am calling to support your healthcare needs. Please contact me at [phone] at your earliest convenience. I look forward to speaking with you soon.   Thank you,   Rosina Forte, BSN RN [facility], [facility] RN Care Manager Direct Dial: [phone]  Fax: [phone]

## 2024-03-31 NOTE — Telephone Encounter (Signed)
Faxed more information

## 2024-04-05 ENCOUNTER — Other Ambulatory Visit (HOSPITAL_COMMUNITY): Payer: Self-pay

## 2024-04-05 ENCOUNTER — Encounter: Admitting: Vascular Surgery

## 2024-04-05 NOTE — Telephone Encounter (Signed)
 I called bms and they said they are having trouble verifying the patient's insurance. I sent them a copy of the patient's ins card 9:08am faxed

## 2024-04-06 DIAGNOSIS — Z6823 Body mass index (BMI) 23.0-23.9, adult: Secondary | ICD-10-CM | POA: Diagnosis not present

## 2024-04-06 DIAGNOSIS — I2510 Atherosclerotic heart disease of native coronary artery without angina pectoris: Secondary | ICD-10-CM | POA: Diagnosis not present

## 2024-04-06 DIAGNOSIS — Z7901 Long term (current) use of anticoagulants: Secondary | ICD-10-CM | POA: Diagnosis not present

## 2024-04-06 DIAGNOSIS — Z48815 Encounter for surgical aftercare following surgery on the digestive system: Secondary | ICD-10-CM | POA: Diagnosis not present

## 2024-04-06 DIAGNOSIS — I252 Old myocardial infarction: Secondary | ICD-10-CM | POA: Diagnosis not present

## 2024-04-06 DIAGNOSIS — K5790 Diverticulosis of intestine, part unspecified, without perforation or abscess without bleeding: Secondary | ICD-10-CM | POA: Diagnosis not present

## 2024-04-06 DIAGNOSIS — Z955 Presence of coronary angioplasty implant and graft: Secondary | ICD-10-CM | POA: Diagnosis not present

## 2024-04-06 DIAGNOSIS — C189 Malignant neoplasm of colon, unspecified: Secondary | ICD-10-CM | POA: Diagnosis not present

## 2024-04-06 DIAGNOSIS — N529 Male erectile dysfunction, unspecified: Secondary | ICD-10-CM | POA: Diagnosis not present

## 2024-04-06 DIAGNOSIS — I739 Peripheral vascular disease, unspecified: Secondary | ICD-10-CM | POA: Diagnosis not present

## 2024-04-06 DIAGNOSIS — I5022 Chronic systolic (congestive) heart failure: Secondary | ICD-10-CM | POA: Diagnosis not present

## 2024-04-06 DIAGNOSIS — Z87891 Personal history of nicotine dependence: Secondary | ICD-10-CM | POA: Diagnosis not present

## 2024-04-06 DIAGNOSIS — I110 Hypertensive heart disease with heart failure: Secondary | ICD-10-CM | POA: Diagnosis not present

## 2024-04-06 DIAGNOSIS — Z433 Encounter for attention to colostomy: Secondary | ICD-10-CM | POA: Diagnosis not present

## 2024-04-06 DIAGNOSIS — D630 Anemia in neoplastic disease: Secondary | ICD-10-CM | POA: Diagnosis not present

## 2024-04-06 DIAGNOSIS — E782 Mixed hyperlipidemia: Secondary | ICD-10-CM | POA: Diagnosis not present

## 2024-04-06 NOTE — Telephone Encounter (Addendum)
   Scanned to chart media

## 2024-04-07 ENCOUNTER — Telehealth: Payer: Self-pay | Admitting: Pharmacy Technician

## 2024-04-07 ENCOUNTER — Encounter: Payer: Self-pay | Admitting: *Deleted

## 2024-04-07 ENCOUNTER — Other Ambulatory Visit (HOSPITAL_COMMUNITY): Payer: Self-pay

## 2024-04-07 NOTE — Telephone Encounter (Signed)
 Patient was denied assistance through [employer].   Per test claims: Eliquis  5mg  is 366.81 for 30 days  Xarelto  20mg  is 366.81 for 30 days  Dabigatran 150mg  is 141.71 for 30 days  Warfarin would be free

## 2024-04-08 ENCOUNTER — Telehealth: Payer: Self-pay

## 2024-04-08 NOTE — Telephone Encounter (Signed)
 Hi, the patient called back and he was informed he was denied [employer] assistance and denied a tier exception from insurance for the eliquis . Per test claim the generic pradaxa (dabigatran) would be 141.71 for 30 days on insurance. He said he could try that, if that is ok? He can not afford the eliquis  nor the xarelto  since they are both 366.81 for 30 days. He said he has enough eliquis  until Monday from the samples he was given. He also has appt Monday with Glendia Ferrier. Thank you

## 2024-04-08 NOTE — Telephone Encounter (Signed)
 Lmom for patient to call back to go over options

## 2024-04-08 NOTE — Telephone Encounter (Signed)
 Dr. Wendel He is a prior pt of Dr. Alveta and I was going to have him follow with you. He is on Eliquis  for LV thrombus. But the cost is too great. Pradaxa is more affordable. From what I can find, DOACs are listed as reasonable treatments with limited data. So I assume any of them would be fine. Do you think it would be ok to transition him to Pradaxa?  Thanks, [employer]

## 2024-04-08 NOTE — Telephone Encounter (Signed)
 I am covering [name] [name]'s inbox today. Per chart review, patient is on Eliquis  due to LV thrombus. Not clear to me if we have data regarding Pradaxa use in this indication. As he has enough to last until upcoming appointment with [facility].on Monday, I will leave this in his inbox for him to review and discuss with patient at upcoming OV. I also added FYI to appt notes.

## 2024-04-08 NOTE — Progress Notes (Signed)
 Complex Care Management Care Guide Note  04/08/2024 Name: [name] [name]. MRN: [id number] DOB: 04-Sep-1946  [name] [name]. is a 78 y.o. year old male who is a primary care patient of Rakes, Rock HERO, FNP and is actively engaged with the care management team. I reached out to [name] [name]. by phone today to assist with re-scheduling  with the RN Case Manager.  Follow up plan: Unsuccessful telephone outreach attempt made. A HIPAA compliant phone message was left for the patient providing contact information and requesting a return call.  Jeoffrey Buffalo , RMA     [facility]  [facility], [facility] Guide  Direct Dial: [phone]  Website: [email or web address]
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
## 2024-04-11 ENCOUNTER — Encounter: Payer: Self-pay | Admitting: Physician Assistant

## 2024-04-11 ENCOUNTER — Ambulatory Visit: Attending: Physician Assistant | Admitting: Physician Assistant

## 2024-04-11 VITALS — BP 120/60 | HR 62 | Ht 66.5 in | Wt 136.0 lb

## 2024-04-11 DIAGNOSIS — I739 Peripheral vascular disease, unspecified: Secondary | ICD-10-CM

## 2024-04-11 DIAGNOSIS — I513 Intracardiac thrombosis, not elsewhere classified: Secondary | ICD-10-CM | POA: Diagnosis not present

## 2024-04-11 DIAGNOSIS — E782 Mixed hyperlipidemia: Secondary | ICD-10-CM

## 2024-04-11 DIAGNOSIS — I2510 Atherosclerotic heart disease of native coronary artery without angina pectoris: Secondary | ICD-10-CM | POA: Diagnosis not present

## 2024-04-11 DIAGNOSIS — I5020 Unspecified systolic (congestive) heart failure: Secondary | ICD-10-CM

## 2024-04-11 MED ORDER — LOSARTAN POTASSIUM 25 MG PO TABS: 12.5000 mg | ORAL_TABLET | Freq: Every day | ORAL | 3 refills | Status: AC

## 2024-04-11 MED ORDER — DABIGATRAN ETEXILATE MESYLATE 150 MG PO CAPS: 150.0000 mg | ORAL_CAPSULE | Freq: Two times a day (BID) | ORAL | 11 refills | Status: DC

## 2024-04-11 NOTE — Patient Instructions (Signed)
 Medication Instructions:  Your physician has recommended you make the following change in your medication:   STOP Eliquis   START Pradaxa  150 mg taking 1 twice a day  START Losartan  25 mg taking 1/2 tablet daily  *If you need a refill on your cardiac medications before your next appointment, please call your pharmacy*  Lab Work: None ordered  If you have labs (blood work) drawn today and your tests are completely normal, you will receive your results only by: MyChart Message (if you have MyChart) OR A paper copy in the mail If you have any lab test that is abnormal or we need to change your treatment, we will call you to review the results.  Testing/Procedures: Your physician has requested that you have an echocardiogram. Echocardiography is a painless test that uses sound waves to create images of your heart. It provides your doctor with information about the size and shape of your heart and how well your heart's chambers and valves are working. This procedure takes approximately one hour. There are no restrictions for this procedure. Please do NOT wear cologne, perfume, aftershave, or lotions (deodorant is allowed). Please arrive 15 minutes prior to your appointment time.  Please note: We ask at that you not bring children with you during ultrasound (echo/ vascular) testing. Due to room size and safety concerns, children are not allowed in the ultrasound rooms during exams. Our front office staff cannot provide observation of children in our lobby area while testing is being conducted. An adult accompanying a patient to their appointment will only be allowed in the ultrasound room at the discretion of the ultrasound technician under special circumstances. We apologize for any inconvenience.   Follow-Up: At [facility], you and your health needs are our priority.  As part of our continuing mission to provide you with exceptional heart care, our providers are all part of one team.   This team includes your primary Cardiologist (physician) and Advanced Practice Providers or APPs (Physician Assistants and Nurse Practitioners) who all work together to provide you with the care you need, when you need it.  Your next appointment:   3 month(s)  Provider:   Glendia Ferrier, PA-C          We recommend signing up for the patient portal called MyChart.  Sign up information is provided on this After Visit Summary.  MyChart is used to connect with patients for Virtual Visits (Telemedicine).  Patients are able to view lab/test results, encounter notes, upcoming appointments, etc.  Non-urgent messages can be sent to your provider as well.   To learn more about what you can do with MyChart, go to [email or web address].   Other Instructions

## 2024-04-11 NOTE — Assessment & Plan Note (Signed)
 He cannot afford Eliquis , so will transition to Pradaxa . Will also plan to repeat echocardiogram to assess for resolution of thrombus. If thrombus has resolved, anticoagulation may be stopped and antiplatelet monotherapy considered. - Stop Eliquis . - Start Pradaxa  150 mg twice daily. - Arrange follow-up echocardiogram in one month to assess for thrombus resolution.

## 2024-04-11 NOTE — Assessment & Plan Note (Signed)
 Hyperlipidemia managed with rosuvastatin . LDL in June 2025 was 83. If LDL remains above 70, consider increasing the dose of rosuvastatin . - Continue rosuvastatin  20 mg daily.

## 2024-04-11 NOTE — Assessment & Plan Note (Signed)
 Ejection fraction is 25-30%. Volume status is stable. Blood pressure has improved since hospitalization in May for colon surgery. GDMT has been limited by hypotension, but blood pressure is now better, allowing for potential reinitiation of low dose losartan . - Continue metoprolol  tartrate 12.5 mg twice daily. - Start losartan  12.5 mg daily. - Follow up on labs done with oncology in the next several weeks to recheck creatinine after starting ARB. - Follow up in three months. - Depending on his prognosis with CA, we can consider reassessing his EF and +/- referral to EP for ICD

## 2024-04-11 NOTE — Assessment & Plan Note (Signed)
 Coronary artery disease with prior stenting to RCA in 2002 and and subsequent PCI to LAD. Non-STEMI in October 2022 treated with DES x 2 to RCA. LAD stent patent at that time.  He is not having chest discomfort to suggest angina.   - Continue metoprolol  tartrate 12.5 mg twice daily, nitroglycerin  as needed, Crestor  20 mg daily

## 2024-04-12 ENCOUNTER — Other Ambulatory Visit (HOSPITAL_COMMUNITY): Payer: Self-pay

## 2024-04-12 ENCOUNTER — Telehealth: Payer: Self-pay | Admitting: Family Medicine

## 2024-04-12 NOTE — Telephone Encounter (Signed)
 I called and spoke with patient and made him aware that he has samples of ELIQUIS  in the cabinet that are ready to be picked up. Patient said he would come by the office tomorrow morning to pick up.

## 2024-04-13 DIAGNOSIS — Z48815 Encounter for surgical aftercare following surgery on the digestive system: Secondary | ICD-10-CM | POA: Diagnosis not present

## 2024-04-13 DIAGNOSIS — K5790 Diverticulosis of intestine, part unspecified, without perforation or abscess without bleeding: Secondary | ICD-10-CM | POA: Diagnosis not present

## 2024-04-13 DIAGNOSIS — Z87891 Personal history of nicotine dependence: Secondary | ICD-10-CM | POA: Diagnosis not present

## 2024-04-13 DIAGNOSIS — N529 Male erectile dysfunction, unspecified: Secondary | ICD-10-CM | POA: Diagnosis not present

## 2024-04-13 DIAGNOSIS — I110 Hypertensive heart disease with heart failure: Secondary | ICD-10-CM | POA: Diagnosis not present

## 2024-04-13 DIAGNOSIS — I5022 Chronic systolic (congestive) heart failure: Secondary | ICD-10-CM | POA: Diagnosis not present

## 2024-04-13 DIAGNOSIS — Z955 Presence of coronary angioplasty implant and graft: Secondary | ICD-10-CM | POA: Diagnosis not present

## 2024-04-13 DIAGNOSIS — Z433 Encounter for attention to colostomy: Secondary | ICD-10-CM | POA: Diagnosis not present

## 2024-04-13 DIAGNOSIS — I739 Peripheral vascular disease, unspecified: Secondary | ICD-10-CM | POA: Diagnosis not present

## 2024-04-13 DIAGNOSIS — I2510 Atherosclerotic heart disease of native coronary artery without angina pectoris: Secondary | ICD-10-CM | POA: Diagnosis not present

## 2024-04-13 DIAGNOSIS — I252 Old myocardial infarction: Secondary | ICD-10-CM | POA: Diagnosis not present

## 2024-04-13 DIAGNOSIS — D630 Anemia in neoplastic disease: Secondary | ICD-10-CM | POA: Diagnosis not present

## 2024-04-13 DIAGNOSIS — Z6823 Body mass index (BMI) 23.0-23.9, adult: Secondary | ICD-10-CM | POA: Diagnosis not present

## 2024-04-13 DIAGNOSIS — Z7901 Long term (current) use of anticoagulants: Secondary | ICD-10-CM | POA: Diagnosis not present

## 2024-04-13 DIAGNOSIS — C189 Malignant neoplasm of colon, unspecified: Secondary | ICD-10-CM | POA: Diagnosis not present

## 2024-04-13 DIAGNOSIS — E782 Mixed hyperlipidemia: Secondary | ICD-10-CM | POA: Diagnosis not present

## 2024-04-14 ENCOUNTER — Telehealth: Payer: Self-pay

## 2024-04-14 DIAGNOSIS — Z955 Presence of coronary angioplasty implant and graft: Secondary | ICD-10-CM | POA: Diagnosis not present

## 2024-04-14 DIAGNOSIS — I5022 Chronic systolic (congestive) heart failure: Secondary | ICD-10-CM | POA: Diagnosis not present

## 2024-04-14 DIAGNOSIS — E782 Mixed hyperlipidemia: Secondary | ICD-10-CM | POA: Diagnosis not present

## 2024-04-14 DIAGNOSIS — Z87891 Personal history of nicotine dependence: Secondary | ICD-10-CM | POA: Diagnosis not present

## 2024-04-14 DIAGNOSIS — I2510 Atherosclerotic heart disease of native coronary artery without angina pectoris: Secondary | ICD-10-CM | POA: Diagnosis not present

## 2024-04-14 DIAGNOSIS — I110 Hypertensive heart disease with heart failure: Secondary | ICD-10-CM | POA: Diagnosis not present

## 2024-04-14 DIAGNOSIS — I252 Old myocardial infarction: Secondary | ICD-10-CM | POA: Diagnosis not present

## 2024-04-14 DIAGNOSIS — Z7901 Long term (current) use of anticoagulants: Secondary | ICD-10-CM | POA: Diagnosis not present

## 2024-04-14 DIAGNOSIS — Z48815 Encounter for surgical aftercare following surgery on the digestive system: Secondary | ICD-10-CM | POA: Diagnosis not present

## 2024-04-14 DIAGNOSIS — C189 Malignant neoplasm of colon, unspecified: Secondary | ICD-10-CM | POA: Diagnosis not present

## 2024-04-14 DIAGNOSIS — K5790 Diverticulosis of intestine, part unspecified, without perforation or abscess without bleeding: Secondary | ICD-10-CM | POA: Diagnosis not present

## 2024-04-14 DIAGNOSIS — Z6823 Body mass index (BMI) 23.0-23.9, adult: Secondary | ICD-10-CM | POA: Diagnosis not present

## 2024-04-14 DIAGNOSIS — N529 Male erectile dysfunction, unspecified: Secondary | ICD-10-CM | POA: Diagnosis not present

## 2024-04-14 DIAGNOSIS — D630 Anemia in neoplastic disease: Secondary | ICD-10-CM | POA: Diagnosis not present

## 2024-04-14 DIAGNOSIS — Z433 Encounter for attention to colostomy: Secondary | ICD-10-CM | POA: Diagnosis not present

## 2024-04-14 DIAGNOSIS — I739 Peripheral vascular disease, unspecified: Secondary | ICD-10-CM | POA: Diagnosis not present

## 2024-04-14 NOTE — Telephone Encounter (Signed)
 Copied from CRM [id number]. Topic: Clinical - Home Health Verbal Orders >> Apr 14, 2024  9:36 AM Rosaria BRAVO wrote: Caller/Agency: Nena Gaba Callback Number: [phone] Service Requested: Skilled Nursing Frequency:  Weekly for colostomy bag changes  Any new concerns about the patient? No

## 2024-04-14 NOTE — Telephone Encounter (Signed)
 HH verbal given

## 2024-04-18 ENCOUNTER — Other Ambulatory Visit: Payer: Self-pay

## 2024-04-18 ENCOUNTER — Other Ambulatory Visit (HOSPITAL_COMMUNITY): Payer: Self-pay

## 2024-04-18 ENCOUNTER — Other Ambulatory Visit: Payer: Self-pay | Admitting: Oncology

## 2024-04-18 ENCOUNTER — Other Ambulatory Visit: Payer: Self-pay | Admitting: *Deleted

## 2024-04-18 DIAGNOSIS — C187 Malignant neoplasm of sigmoid colon: Secondary | ICD-10-CM

## 2024-04-18 MED ORDER — CAPECITABINE 500 MG PO TABS
1500.0000 mg | ORAL_TABLET | Freq: Two times a day (BID) | ORAL | 0 refills | Status: DC
  Filled 2024-04-18: qty 84, 21d supply, fill #0

## 2024-04-18 NOTE — Progress Notes (Signed)
 Specialty Pharmacy Refill Coordination Note  [name] [name]. is a 78 y.o. male contacted today regarding refills of specialty medication(s) Capecitabine  (XELODA )   Patient requested Delivery   Delivery date: 04/21/24   Verified address: [address] [place]   Medication will be filled on 04/20/24, pending refill approval.

## 2024-04-18 NOTE — Progress Notes (Signed)
guard

## 2024-04-19 ENCOUNTER — Other Ambulatory Visit: Payer: Self-pay

## 2024-04-19 ENCOUNTER — Encounter: Payer: Self-pay | Admitting: Nurse Practitioner

## 2024-04-19 ENCOUNTER — Other Ambulatory Visit (HOSPITAL_COMMUNITY): Payer: Self-pay

## 2024-04-19 ENCOUNTER — Inpatient Hospital Stay: Attending: Oncology

## 2024-04-19 ENCOUNTER — Inpatient Hospital Stay (HOSPITAL_BASED_OUTPATIENT_CLINIC_OR_DEPARTMENT_OTHER): Admitting: Nurse Practitioner

## 2024-04-19 VITALS — BP 128/63 | HR 60 | Temp 97.8°F | Resp 18 | Ht 66.5 in | Wt 135.8 lb

## 2024-04-19 DIAGNOSIS — C187 Malignant neoplasm of sigmoid colon: Secondary | ICD-10-CM | POA: Insufficient documentation

## 2024-04-19 DIAGNOSIS — E785 Hyperlipidemia, unspecified: Secondary | ICD-10-CM | POA: Diagnosis not present

## 2024-04-19 DIAGNOSIS — R911 Solitary pulmonary nodule: Secondary | ICD-10-CM | POA: Diagnosis not present

## 2024-04-19 DIAGNOSIS — I110 Hypertensive heart disease with heart failure: Secondary | ICD-10-CM | POA: Diagnosis not present

## 2024-04-19 DIAGNOSIS — Z9049 Acquired absence of other specified parts of digestive tract: Secondary | ICD-10-CM | POA: Diagnosis not present

## 2024-04-19 DIAGNOSIS — Z933 Colostomy status: Secondary | ICD-10-CM | POA: Diagnosis not present

## 2024-04-19 DIAGNOSIS — I509 Heart failure, unspecified: Secondary | ICD-10-CM | POA: Insufficient documentation

## 2024-04-19 DIAGNOSIS — J449 Chronic obstructive pulmonary disease, unspecified: Secondary | ICD-10-CM | POA: Insufficient documentation

## 2024-04-19 LAB — CMP (CANCER CENTER ONLY)
ALT: 87 U/L — ABNORMAL HIGH (ref 0–44)
AST: 69 U/L — ABNORMAL HIGH (ref 15–41)
Albumin: 4.3 g/dL (ref 3.5–5.0)
Alkaline Phosphatase: 67 U/L (ref 38–126)
Anion gap: 8 (ref 5–15)
BUN: 16 mg/dL (ref 8–23)
CO2: 26 mmol/L (ref 22–32)
Calcium: 9.6 mg/dL (ref 8.9–10.3)
Chloride: 102 mmol/L (ref 98–111)
Creatinine: 0.76 mg/dL (ref 0.61–1.24)
GFR, Estimated: >60 mL/min (ref >60)
Glucose, Bld: 98 mg/dL (ref 70–99)
Potassium: 5.1 mmol/L (ref 3.5–5.1)
Sodium: 136 mmol/L (ref 135–145)
Total Bilirubin: 1.1 mg/dL (ref 0.0–1.2)
Total Protein: 6.6 g/dL (ref 6.5–8.1)

## 2024-04-19 LAB — CBC WITH DIFFERENTIAL (CANCER CENTER ONLY)
Abs Immature Granulocytes: 0.03 K/uL (ref 0.00–0.07)
Basophils Absolute: 0 K/uL (ref 0.0–0.1)
Basophils Relative: 1 %
Eosinophils Absolute: 0.2 K/uL (ref 0.0–0.5)
Eosinophils Relative: 3 %
HCT: 40.8 % (ref 39.0–52.0)
Hemoglobin: 13.7 g/dL (ref 13.0–17.0)
Immature Granulocytes: 1 %
Lymphocytes Relative: 30 %
Lymphs Abs: 1.9 K/uL (ref 0.7–4.0)
MCH: 30.5 pg (ref 26.0–34.0)
MCHC: 33.6 g/dL (ref 30.0–36.0)
MCV: 90.9 fL (ref 80.0–100.0)
Monocytes Absolute: 0.6 K/uL (ref 0.1–1.0)
Monocytes Relative: 9 %
Neutro Abs: 3.5 K/uL (ref 1.7–7.7)
Neutrophils Relative %: 56 %
Platelet Count: 150 K/uL (ref 150–400)
RBC: 4.49 MIL/uL (ref 4.22–5.81)
RDW: 19.4 % — ABNORMAL HIGH (ref 11.5–15.5)
WBC Count: 6.2 K/uL (ref 4.0–10.5)
nRBC: 0 % (ref 0.0–0.2)

## 2024-04-19 NOTE — Progress Notes (Signed)
  [facility] OFFICE PROGRESS NOTE   Diagnosis: Colon cancer  INTERVAL HISTORY:   [name] [name] returns as scheduled.  He completed cycle 2 capecitabine  beginning 04/04/2024.  He denies nausea/vomiting.  No mouth sores.  No diarrhea.  No hand or foot pain or redness.  Objective:  Vital signs in last 24 hours:  Blood pressure 128/63, pulse 60, temperature 97.8 F (36.6 C), temperature source Temporal, resp. rate 18, height 5' 6.5 (1.689 m), weight 135 lb 12.8 oz (61.6 kg), SpO2 96%.    HEENT: No thrush or ulcers. Resp: Lungs clear bilaterally. Cardio: Regular rate and rhythm. GI: No hepatomegaly.  Left lower quadrant colostomy. Vascular: No leg edema. Skin: Palms with mild erythema, no skin breakdown.  Soles without erythema.   Lab Results:  Lab Results  Component Value Date   WBC 6.2 04/19/2024   HGB 13.7 04/19/2024   HCT 40.8 04/19/2024   MCV 90.9 04/19/2024   PLT 150 04/19/2024   NEUTROABS 3.5 04/19/2024    Imaging:  No results found.  Medications: I have reviewed the patient's current medications.  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), status post a sigmoidectomy and end colostomy 02/01/2024 0/11 nodes, positive as enteric and distal margin, macroscopic tumor perforation-carcinoma involves distal disrupted segment CT angio chest/abdomen/pelvis 01/26/2024: Severe emphysema, segmental circumferential sigmoid thickening with associated contained perforation/abscess no adenopathy CT abdomen/pelvis 01/29/2024: Persistent perforated sigmoid diverticulitis with 2 adjacent fluid collections, 1 containing gas CT chest 02/02/2024: Tiny bilateral noncalcified pulmonary nodules nonspecific severe emphysema 03/07/2024-guardant reveal ctDNA not detected Cycle 1 capecitabine  03/14/2024 Cycle 2 capecitabine  04/04/2024 Cycle 3 capecitabine  04/25/2024 COPD CAD CHF, possible early LV thrombus on echocardiogram 01/28/2024, LVEF  25-30%-apixaban  Hypertension Hyperlipidemia Pulmonary nodules-nonspecific pulmonary nodules on CT 01/26/2024 and 02/02/2024  Disposition: [name] [name] appears stable.  He has completed 2 cycles of capecitabine .  He continues to tolerate well.  He will begin cycle 3 on 04/25/2024.  We reviewed the overall plan  to complete 3 cycles of Xeloda , then radiation, then 3 additional cycles of Xeloda .  He will return for lab and follow-up in 3 weeks.  We are available to see him sooner if needed.    Olam Ned ANP/GNP-BC   04/19/2024  10:57 AM

## 2024-04-20 ENCOUNTER — Other Ambulatory Visit: Payer: Self-pay | Admitting: *Deleted

## 2024-04-20 ENCOUNTER — Other Ambulatory Visit: Payer: Self-pay

## 2024-04-20 ENCOUNTER — Ambulatory Visit (HOSPITAL_COMMUNITY)
Admission: RE | Admit: 2024-04-20 | Discharge: 2024-04-20 | Disposition: A | Discharge status: Home / Self Care | Source: Ambulatory Visit | Attending: Cardiovascular Disease | Admitting: Cardiovascular Disease

## 2024-04-20 DIAGNOSIS — I513 Intracardiac thrombosis, not elsewhere classified: Secondary | ICD-10-CM | POA: Diagnosis not present

## 2024-04-20 DIAGNOSIS — D630 Anemia in neoplastic disease: Secondary | ICD-10-CM | POA: Diagnosis not present

## 2024-04-20 DIAGNOSIS — E782 Mixed hyperlipidemia: Secondary | ICD-10-CM | POA: Diagnosis not present

## 2024-04-20 DIAGNOSIS — I739 Peripheral vascular disease, unspecified: Secondary | ICD-10-CM | POA: Diagnosis not present

## 2024-04-20 DIAGNOSIS — I5022 Chronic systolic (congestive) heart failure: Secondary | ICD-10-CM | POA: Diagnosis not present

## 2024-04-20 DIAGNOSIS — I252 Old myocardial infarction: Secondary | ICD-10-CM | POA: Diagnosis not present

## 2024-04-20 DIAGNOSIS — Z48815 Encounter for surgical aftercare following surgery on the digestive system: Secondary | ICD-10-CM | POA: Diagnosis not present

## 2024-04-20 DIAGNOSIS — I110 Hypertensive heart disease with heart failure: Secondary | ICD-10-CM | POA: Diagnosis not present

## 2024-04-20 DIAGNOSIS — N529 Male erectile dysfunction, unspecified: Secondary | ICD-10-CM | POA: Diagnosis not present

## 2024-04-20 DIAGNOSIS — K5790 Diverticulosis of intestine, part unspecified, without perforation or abscess without bleeding: Secondary | ICD-10-CM | POA: Diagnosis not present

## 2024-04-20 DIAGNOSIS — Z6823 Body mass index (BMI) 23.0-23.9, adult: Secondary | ICD-10-CM | POA: Diagnosis not present

## 2024-04-20 DIAGNOSIS — Z955 Presence of coronary angioplasty implant and graft: Secondary | ICD-10-CM | POA: Diagnosis not present

## 2024-04-20 DIAGNOSIS — I5020 Unspecified systolic (congestive) heart failure: Secondary | ICD-10-CM | POA: Diagnosis not present

## 2024-04-20 DIAGNOSIS — I2510 Atherosclerotic heart disease of native coronary artery without angina pectoris: Secondary | ICD-10-CM | POA: Diagnosis not present

## 2024-04-20 DIAGNOSIS — Z87891 Personal history of nicotine dependence: Secondary | ICD-10-CM | POA: Diagnosis not present

## 2024-04-20 DIAGNOSIS — Z433 Encounter for attention to colostomy: Secondary | ICD-10-CM | POA: Diagnosis not present

## 2024-04-20 DIAGNOSIS — Z7901 Long term (current) use of anticoagulants: Secondary | ICD-10-CM | POA: Diagnosis not present

## 2024-04-20 DIAGNOSIS — C189 Malignant neoplasm of colon, unspecified: Secondary | ICD-10-CM | POA: Diagnosis not present

## 2024-04-20 LAB — ECHOCARDIOGRAM LIMITED
Area-P 1/2: 2.46 cm2
S' Lateral: 4.3 cm

## 2024-04-20 MED ORDER — PERFLUTREN LIPID MICROSPHERE
1.0000 mL | INTRAVENOUS | Status: AC | PRN
  Administered 2024-04-20: 2 mL via INTRAVENOUS

## 2024-04-20 NOTE — Patient Instructions (Signed)
 Visit Information  Thank you for taking time to visit with me today. Please don't hesitate to contact me if I can be of assistance to you before our next scheduled appointment.  Our next appointment is by telephone on 05-04-24 at 10:30 am Please call the care guide team at [phone] if you need to cancel or reschedule your appointment.   Following is a copy of your care plan:   Goals Addressed             This Visit's Progress    VBCI RN Care Plan       Problems:  Chronic Disease Management support and education needs related to Cancer Sigmoid Colon  Goal: Over the next 90 days the Patient will attend all scheduled medical appointments: PCP and Specialist as evidenced by keeping all schedule appointments.        continue to work with Medical illustrator and/or Social Worker to address care management and care coordination needs related to Cancer of Sigmoid Colon as evidenced by adherence to care management team scheduled appointments     take all medications exactly as prescribed and will call provider for medication related questions as evidenced by compliance with all medications.    verbalize basic understanding of Cancer Sigmoid Colon disease process and self health management plan as evidenced by verbal explantation, recognizing and monitoring symptoms and lifestyle modifications.    Interventions:   Oncology: Assessment of understanding of oncology diagnosis: Cancer Sigmoid Colon  Assessed patient understanding of cancer diagnosis and recommended treatment plan: Reviewed with patient labs and provided education for abnormal labs. Patient verbalized full understanding.  RNCM  provided phone number to review labs for next lab draws.   Reviewed upcoming provider appointments and treatment appointments Assessed available transportation to appointments and treatments. Has consistent/reliable transportation: Yes Assessed support system. Has consistent/reliable family or other support:  Yes PHQ2/PHQ9 performed  Patient Self-Care Activities:  Attend all scheduled provider appointments Call pharmacy for medication refills 3-7 days in advance of running out of medications Call provider office for new concerns or questions  Take medications as prescribed    Plan:  Telephone follow up appointment with care management team member scheduled for:  05-04-24 10:30 am             Please call the Suicide and Crisis Lifeline: 988 call the [place]  National Suicide Prevention Lifeline: 260-814-8459 or TTY: (403)621-3994 TTY 617-588-9376) to talk to a trained counselor call the [facility]: (970)[phone] if you are experiencing a Mental Health or Behavioral Health Crisis or need someone to talk to.  Patient verbalizes understanding of instructions and care plan provided today and agrees to view in MyChart. Active MyChart status and patient understanding of how to access instructions and care plan via MyChart confirmed with patient.     Freida Nebel, RN, BSN, Theatre manager [employer] [phone]

## 2024-04-20 NOTE — Patient Outreach (Signed)
 Complex Care Management   Visit Note  04/20/2024  Name:  [name] [name]. MRN: [id number] DOB: 01-18-1946  Situation: Referral received for Complex Care Management related to Acute Diverticulitis I obtained verbal consent from Patient.  Visit completed with Patient  on the phone  Background:   Past Medical History:  Diagnosis Date   Coronary artery disease    Erectile dysfunction    HFrEF (heart failure with reduced ejection fraction) (HCC)    Echocardiogram 07/14/21:  EF 25, global HK, inf HK worse, Gr 2 DD, normal RVSF, mild to mod MR, AV sclerosis w/o AS Echocardiogram 2/23: EF 25-30, global HK, GLS -12.9, normal RVSF, mild to mod MR, AV sclerosis w/o AS    History of hiatal hernia    a lone time ago   Hyperlipidemia    [place] fracture [facility]) 03/22/2020   Myocardial infarction (HCC)    x 2   Polio     Assessment: Patient Reported Symptoms:  Cognitive Cognitive Status: No symptoms reported      Neurological Neurological Review of Symptoms: No symptoms reported Neurological Management Strategies: Routine screening Neurological Self-Management Outcome: 4 (good)  HEENT HEENT Symptoms Reported: No symptoms reported HEENT Management Strategies: Routine screening HEENT Self-Management Outcome: 4 (good)    Cardiovascular Cardiovascular Symptoms Reported: No symptoms reported Cardiovascular Management Strategies: Medication therapy, Routine screening, Adequate rest Cardiovascular Self-Management Outcome: 4 (good)  Respiratory Respiratory Symptoms Reported: No symptoms reported Respiratory Management Strategies: Routine screening, Adequate rest Respiratory Self-Management Outcome: 4 (good)  Endocrine Endocrine Symptoms Reported: No symptoms reported Is patient diabetic?: No Endocrine Self-Management Outcome: 4 (good)  Gastrointestinal   Gastrointestinal Management Strategies: Colostomy Colostomy Stoma Size: 30cm Colostomy Supplies Needed: unsure Colostomy Supplies Obtained:  Yes Gastrointestinal Self-Management Outcome: 4 (good)    Genitourinary Genitourinary Symptoms Reported: No symptoms reported Genitourinary Self-Management Outcome: 4 (good)  Integumentary Integumentary Symptoms Reported: Bruising Additional Integumentary Details: Bruising on arms and hands.  Patient reports provider reports bruising is normal with his prescribed medication. Skin Management Strategies: Routine screening, Medication therapy Skin Self-Management Outcome: 4 (good)  Musculoskeletal Musculoskelatal Symptoms Reviewed: Other Other Musculoskeletal Symptoms: Patient has a hx of polio.  Has weakness in right leg due to hx of polio. Musculoskeletal Management Strategies: Routine screening, Medication therapy, Medical device Musculoskeletal Self-Management Outcome: 4 (good) Falls in the past year?: No Number of falls in past year: 1 or less Was there an injury with Fall?: No Fall Risk Category Calculator: 0 Patient Fall Risk Level: Low Fall Risk    Psychosocial Psychosocial Symptoms Reported: Sadness - if selected complete PHQ 2-9 Additional Psychological Details: Sadness due to have to management  colostomy     Quality of Family Relationships: helpful, supportive Do you feel physically threatened by others?: No      04/20/2024   10:57 AM  Depression screen PHQ 2/9  Decreased Interest 0  Down, Depressed, Hopeless 0  PHQ - 2 Score 0    There were no vitals filed for this visit.  Medications Reviewed Today     Reviewed by Jorja Nichole LABOR, RN (Case Manager) on 04/20/24 at 1037  Med List Status: <None>   Medication Order Taking? Sig Documenting Provider Last Dose Status Informant  capecitabine  (XELODA ) 500 MG tablet [id number] Yes Take 3 tablets (1,500 mg total) by mouth 2 (two) times daily after a meal. Take for 14 days, the hold for 7 days, Repeat every 21 days. Start cycle on 04/25/2024 Cloretta Arley NOVAK, MD  Active   cholecalciferol  (  VITAMIN D3) 25 MCG (1000 UNIT) tablet  [id number] Yes Take 1,000 Units by mouth daily.  [provider]  Active Self, Pharmacy Records  dabigatran  (PRADAXA ) 150 MG CAPS capsule [id number]  Take 1 capsule (150 mg total) by mouth 2 (two) times daily.  Patient not taking: Reported on 04/20/2024   Lelon Hamilton T, PA-C  Active   ketoconazole (NIZORAL) 2 % cream [id number] Yes Apply 1 Application topically 2 (two) times daily. [provider]  Active Self, Pharmacy Records  losartan  (COZAAR ) 25 MG tablet [id number] Yes Take 0.5 tablets (12.5 mg total) by mouth daily. Lelon Hamilton T, PA-C  Active   metoprolol  tartrate (LOPRESSOR ) 25 MG tablet [id number] Yes Take 0.5 tablets (12.5 mg total) by mouth 2 (two) times daily. Lelon Hamilton T, PA-C  Active   nitroGLYCERIN  (NITROSTAT ) 0.4 MG SL tablet [id number] Yes Place 1 tablet (0.4 mg total) under the tongue every 5 (five) minutes as needed for chest pain. Merlynn Niki FALCON, FNP  Active Self, Pharmacy Records  ondansetron  (ZOFRAN ) 8 MG tablet [id number]  Take 1 tablet (8 mg total) by mouth every 8 (eight) hours as needed for nausea.  Patient not taking: Reported on 04/20/2024   Cloretta Arley NOVAK, MD  Active   oxyCODONE  (OXY IR/ROXICODONE ) 5 MG immediate release tablet [id number]  Take 1-2 tablets (5-10 mg total) by mouth every 6 (six) hours as needed for severe pain (pain score 7-10).  Patient not taking: Reported on 04/20/2024   Krishnan, Gokul, MD  Active   polyethylene glycol powder (GLYCOLAX /MIRALAX ) 17 GM/SCOOP powder [id number]  Take 17 g by mouth daily.  Patient not taking: Reported on 04/20/2024   Krishnan, Gokul, MD  Active   rosuvastatin  (CRESTOR ) 20 MG tablet [id number] Yes Take 1 tablet (20 mg total) by mouth daily. Lelon Hamilton DASEN, PA-C  Active   Med List Note Teretha Renaee SAILOR, RPH-CPP 03/08/24 9079): Xeloda  filled at [facility] (Specialty)            Recommendation:   Continue Current Plan of Care  Follow Up Plan:   Telephone follow-up 2 weeks  Anita Mcadory, RN, BSN, ACM RN Care Manager [employer] [phone]

## 2024-04-21 ENCOUNTER — Ambulatory Visit: Payer: Self-pay | Admitting: Internal Medicine

## 2024-04-21 DIAGNOSIS — I513 Intracardiac thrombosis, not elsewhere classified: Secondary | ICD-10-CM

## 2024-04-21 DIAGNOSIS — I5020 Unspecified systolic (congestive) heart failure: Secondary | ICD-10-CM

## 2024-04-22 ENCOUNTER — Encounter: Payer: Self-pay | Admitting: Physician Assistant

## 2024-04-22 ENCOUNTER — Other Ambulatory Visit: Payer: Self-pay | Admitting: Physician Assistant

## 2024-04-22 MED ORDER — TICAGRELOR 60 MG PO TABS: ORAL_TABLET | ORAL | 3 refills | Status: DC

## 2024-04-22 MED ORDER — DABIGATRAN ETEXILATE MESYLATE 150 MG PO CAPS: 150.0000 mg | ORAL_CAPSULE | Freq: Two times a day (BID) | ORAL | Status: DC

## 2024-04-27 ENCOUNTER — Other Ambulatory Visit: Payer: Self-pay | Admitting: *Deleted

## 2024-04-27 DIAGNOSIS — C187 Malignant neoplasm of sigmoid colon: Secondary | ICD-10-CM

## 2024-04-27 DIAGNOSIS — I5022 Chronic systolic (congestive) heart failure: Secondary | ICD-10-CM | POA: Diagnosis not present

## 2024-04-27 DIAGNOSIS — I252 Old myocardial infarction: Secondary | ICD-10-CM | POA: Diagnosis not present

## 2024-04-27 DIAGNOSIS — E782 Mixed hyperlipidemia: Secondary | ICD-10-CM | POA: Diagnosis not present

## 2024-04-27 DIAGNOSIS — Z7901 Long term (current) use of anticoagulants: Secondary | ICD-10-CM | POA: Diagnosis not present

## 2024-04-27 DIAGNOSIS — I110 Hypertensive heart disease with heart failure: Secondary | ICD-10-CM | POA: Diagnosis not present

## 2024-04-27 DIAGNOSIS — N529 Male erectile dysfunction, unspecified: Secondary | ICD-10-CM | POA: Diagnosis not present

## 2024-04-27 DIAGNOSIS — Z433 Encounter for attention to colostomy: Secondary | ICD-10-CM | POA: Diagnosis not present

## 2024-04-27 DIAGNOSIS — Z955 Presence of coronary angioplasty implant and graft: Secondary | ICD-10-CM | POA: Diagnosis not present

## 2024-04-27 DIAGNOSIS — I2510 Atherosclerotic heart disease of native coronary artery without angina pectoris: Secondary | ICD-10-CM | POA: Diagnosis not present

## 2024-04-27 DIAGNOSIS — Z6823 Body mass index (BMI) 23.0-23.9, adult: Secondary | ICD-10-CM | POA: Diagnosis not present

## 2024-04-27 DIAGNOSIS — D630 Anemia in neoplastic disease: Secondary | ICD-10-CM | POA: Diagnosis not present

## 2024-04-27 DIAGNOSIS — K5790 Diverticulosis of intestine, part unspecified, without perforation or abscess without bleeding: Secondary | ICD-10-CM | POA: Diagnosis not present

## 2024-04-27 DIAGNOSIS — C189 Malignant neoplasm of colon, unspecified: Secondary | ICD-10-CM | POA: Diagnosis not present

## 2024-04-27 DIAGNOSIS — Z48815 Encounter for surgical aftercare following surgery on the digestive system: Secondary | ICD-10-CM | POA: Diagnosis not present

## 2024-04-27 DIAGNOSIS — Z87891 Personal history of nicotine dependence: Secondary | ICD-10-CM | POA: Diagnosis not present

## 2024-04-27 DIAGNOSIS — I739 Peripheral vascular disease, unspecified: Secondary | ICD-10-CM | POA: Diagnosis not present

## 2024-04-27 NOTE — Progress Notes (Signed)
 Orders for Guardant Reveal timepoint #2 testing on 05/10/24 placed.

## 2024-04-29 ENCOUNTER — Other Ambulatory Visit (HOSPITAL_COMMUNITY): Payer: Self-pay

## 2024-05-02 ENCOUNTER — Other Ambulatory Visit: Payer: Self-pay

## 2024-05-02 ENCOUNTER — Other Ambulatory Visit: Payer: Self-pay | Admitting: Oncology

## 2024-05-02 DIAGNOSIS — C187 Malignant neoplasm of sigmoid colon: Secondary | ICD-10-CM

## 2024-05-02 NOTE — Telephone Encounter (Signed)
 Reorder on 8/26 visit.

## 2024-05-03 ENCOUNTER — Telehealth (HOSPITAL_COMMUNITY): Payer: Self-pay

## 2024-05-03 ENCOUNTER — Other Ambulatory Visit: Payer: Self-pay

## 2024-05-03 ENCOUNTER — Telehealth: Payer: Self-pay

## 2024-05-03 NOTE — Telephone Encounter (Signed)
 The patient called to inquire about their results from the Guardant Reveal test. I attempted to contact the patient but was unable to reach them.

## 2024-05-03 NOTE — Telephone Encounter (Signed)
 Pt. called and needed to speak to Dr. Audery concerning lab results for his cancer. Informed pt. that he was sent to wrong department and gave his imformation and phone # to call.

## 2024-05-04 ENCOUNTER — Encounter: Payer: Self-pay | Admitting: *Deleted

## 2024-05-04 ENCOUNTER — Telehealth: Payer: Self-pay | Admitting: *Deleted

## 2024-05-04 ENCOUNTER — Other Ambulatory Visit: Payer: Self-pay | Admitting: Oncology

## 2024-05-04 ENCOUNTER — Other Ambulatory Visit: Payer: Self-pay

## 2024-05-04 ENCOUNTER — Other Ambulatory Visit (HOSPITAL_COMMUNITY): Payer: Self-pay

## 2024-05-04 DIAGNOSIS — C187 Malignant neoplasm of sigmoid colon: Secondary | ICD-10-CM

## 2024-05-04 NOTE — Patient Instructions (Signed)
 [name] [name]. - I am sorry I was unable to reach you today for our scheduled appointment. I work with Severa Rock HERO, FNP and am calling to support your healthcare needs. Please contact me at [phone] at your earliest convenience. I look forward to speaking with you soon.   Thank you,   Rosina Forte, BSN RN [facility], [facility] RN Care Manager Direct Dial: [phone]  Fax: [phone]

## 2024-05-04 NOTE — Progress Notes (Signed)
 Per MD: decline refill request for Xeloda  since current cycle is his last before RT starts and dose with RT may differ. Will determine dose at 8/26 appointment.

## 2024-05-05 DIAGNOSIS — D630 Anemia in neoplastic disease: Secondary | ICD-10-CM | POA: Diagnosis not present

## 2024-05-05 DIAGNOSIS — N529 Male erectile dysfunction, unspecified: Secondary | ICD-10-CM | POA: Diagnosis not present

## 2024-05-05 DIAGNOSIS — Z433 Encounter for attention to colostomy: Secondary | ICD-10-CM | POA: Diagnosis not present

## 2024-05-05 DIAGNOSIS — E782 Mixed hyperlipidemia: Secondary | ICD-10-CM | POA: Diagnosis not present

## 2024-05-05 DIAGNOSIS — I5022 Chronic systolic (congestive) heart failure: Secondary | ICD-10-CM | POA: Diagnosis not present

## 2024-05-05 DIAGNOSIS — C189 Malignant neoplasm of colon, unspecified: Secondary | ICD-10-CM | POA: Diagnosis not present

## 2024-05-05 DIAGNOSIS — Z955 Presence of coronary angioplasty implant and graft: Secondary | ICD-10-CM | POA: Diagnosis not present

## 2024-05-05 DIAGNOSIS — I739 Peripheral vascular disease, unspecified: Secondary | ICD-10-CM | POA: Diagnosis not present

## 2024-05-05 DIAGNOSIS — I252 Old myocardial infarction: Secondary | ICD-10-CM | POA: Diagnosis not present

## 2024-05-05 DIAGNOSIS — I110 Hypertensive heart disease with heart failure: Secondary | ICD-10-CM | POA: Diagnosis not present

## 2024-05-05 DIAGNOSIS — K5790 Diverticulosis of intestine, part unspecified, without perforation or abscess without bleeding: Secondary | ICD-10-CM | POA: Diagnosis not present

## 2024-05-05 DIAGNOSIS — I2510 Atherosclerotic heart disease of native coronary artery without angina pectoris: Secondary | ICD-10-CM | POA: Diagnosis not present

## 2024-05-05 DIAGNOSIS — Z7901 Long term (current) use of anticoagulants: Secondary | ICD-10-CM | POA: Diagnosis not present

## 2024-05-05 DIAGNOSIS — Z6823 Body mass index (BMI) 23.0-23.9, adult: Secondary | ICD-10-CM | POA: Diagnosis not present

## 2024-05-05 DIAGNOSIS — Z87891 Personal history of nicotine dependence: Secondary | ICD-10-CM | POA: Diagnosis not present

## 2024-05-05 DIAGNOSIS — Z48815 Encounter for surgical aftercare following surgery on the digestive system: Secondary | ICD-10-CM | POA: Diagnosis not present

## 2024-05-09 ENCOUNTER — Telehealth: Payer: Self-pay | Admitting: Oncology

## 2024-05-10 ENCOUNTER — Other Ambulatory Visit: Payer: Self-pay

## 2024-05-10 ENCOUNTER — Ambulatory Visit
Admission: RE | Admit: 2024-05-10 | Discharge: 2024-05-10 | Disposition: A | Discharge status: Home / Self Care | Source: Ambulatory Visit | Attending: Radiation Oncology | Admitting: Radiation Oncology

## 2024-05-10 ENCOUNTER — Inpatient Hospital Stay

## 2024-05-10 ENCOUNTER — Inpatient Hospital Stay: Admitting: Oncology

## 2024-05-10 ENCOUNTER — Other Ambulatory Visit: Payer: Self-pay | Admitting: *Deleted

## 2024-05-10 VITALS — Ht 66.5 in

## 2024-05-10 DIAGNOSIS — Z510 Encounter for antineoplastic radiation therapy: Secondary | ICD-10-CM | POA: Diagnosis not present

## 2024-05-10 DIAGNOSIS — C187 Malignant neoplasm of sigmoid colon: Secondary | ICD-10-CM | POA: Insufficient documentation

## 2024-05-10 DIAGNOSIS — K6389 Other specified diseases of intestine: Secondary | ICD-10-CM

## 2024-05-10 DIAGNOSIS — Z5986 Financial insecurity: Secondary | ICD-10-CM

## 2024-05-10 NOTE — Progress Notes (Signed)
 Radiation Oncology         [id number]) [phone] ________________________________  Name: [name] [name].        MRN: [id number]  Date of Service: 05/10/2024 DOB: [birth year]  RR:Mjxzd, Rock HERO, FNP  Cloretta Arley NOVAK, MD     REFERRING PHYSICIAN: Cloretta Arley NOVAK, MD   DIAGNOSIS: The encounter diagnosis was Cancer of sigmoid colon [facility]).   HISTORY OF PRESENT ILLNESS: [name] [name]. is a 78 y.o. male seen at the request of Dr. Cloretta for a diagnosis of adenocarcinoma of the sigmoid colon.  The patient was recently diagnosed with his cancer after undergoing an open sigmoid colectomy on 02/01/2024 initially thought to be treatment for diverticulitis.  It was apparent that there were concerns for malignancy during the procedure and a frozen section confirmed adenocarcinoma.  Final pathology confirmed the sigmoid biopsy showing colonic adenocarcinoma in the resection specimen containing colonic adenocarcinoma partially disrupting into pericolonic connective tissue and involving the disrupted portion of the specimen including mesenteric and distal margins.  11 lymph nodes were submitted and were negative for disease.  The additional specimen is called distal margin and second distal margin were both involved the proximal margin was negative.  His case was discussed in multidisciplinary GI oncology conference. Since no additional surgery has been recommended, we discussed chemotherapy followed by chemoradiation. He began xeloda  on 04/13/24 and will take each cycle 2 weeks on, one week off. He began cycle 3 on 04/25/24,  with his week of being the off week of the cycle.   He's seen today to review radiotherapy and proceed with simulation.    PREVIOUS RADIATION THERAPY: No   PAST MEDICAL HISTORY:  Past Medical History:  Diagnosis Date   Coronary artery disease    Erectile dysfunction    HFrEF (heart failure with reduced ejection fraction) (HCC)    Echocardiogram 07/14/21:  EF 25, global HK, inf HK  worse, Gr 2 DD, normal RVSF, mild to mod MR, AV sclerosis w/o AS Echocardiogram 2/23: EF 25-30, global HK, GLS -12.9, normal RVSF, mild to mod MR, AV sclerosis w/o AS    History of hiatal hernia    a lone time ago   Hyperlipidemia    [place] fracture [facility]) 03/22/2020   Myocardial infarction (HCC)    x 2   Polio        PAST SURGICAL HISTORY: Past Surgical History:  Procedure Laterality Date   APPENDECTOMY     CARDIAC CATHETERIZATION     has two stents placed   CERVICAL SPINE SURGERY     c3-4, 4-5,  5-6, 8 screws and plates   COLOSTOMY Left 02/01/2024   Procedure: CREATION, COLOSTOMY;  Surgeon: Polly Cordella LABOR, MD;  Location: [facility] OR;  Service: General;  Laterality: Left;   CORONARY STENT INTERVENTION N/A 07/15/2021   Procedure: CORONARY STENT INTERVENTION;  Surgeon: Court Dorn JINNY, MD;  Location: [facility] INVASIVE CV LAB;  Service: Cardiovascular;  Laterality: N/A;   CORONARY STENT PLACEMENT     forein body removal     metal sharp removed from right leg   LAPAROTOMY N/A 02/01/2024   Procedure: LAPAROTOMY, EXPLORATORY;  Surgeon: Polly Cordella LABOR, MD;  Location: [facility] OR;  Service: General;  Laterality: N/A;  POSSIBLE OSTOMY   LEFT HEART CATH AND CORONARY ANGIOGRAPHY N/A 11/15/2018   Procedure: LEFT HEART CATH AND CORONARY ANGIOGRAPHY;  Surgeon: Mady Bruckner, MD;  [facility]: [facility] INV[facility] CV LAB;  Service: Cardiovascular;  Laterality: N/A;   LEFT HEART CATH  AND CORONARY ANGIOGRAPHY N/A 07/15/2021   Procedure: LEFT HEART CATH AND CORONARY ANGIOGRAPHY;  Surgeon: Court Dorn PARAS, MD;  [facility];  Service: Cardiovascular;  Laterality: N/A;   MANDIBULAR HARDWARE REMOVAL Bilateral 04/23/2020   Procedure: MANDIBULAR HARDWARE REMOVAL;  Surgeon: Jesus Oliphant, MD;  Location: [facility];  Service: ENT;  Laterality: Bilateral;   ORIF MANDIBULAR FRACTURE N/A 03/14/2020   Procedure: OPEN REDUCTION INTERNAL FIXATION (ORIF) MID  FACE FRACTURE, MANDIBULAR FIXATION MODIFIED  ARCH BARS;  Surgeon: Jesus Oliphant, MD;  Location: [facility] OR;  Service: ENT;  Laterality: N/A;   PARTIAL COLECTOMY N/A 02/01/2024   Procedure: COLECTOMY, PARTIAL;  Surgeon: Polly Cordella LABOR, MD;  Location: [facility] OR;  Service: General;  Laterality: N/A;  sigmoid colon   SCAR REVISION N/A 04/23/2020   Procedure: SCAR REVISION/ Upper Lip Repair;  Surgeon: Jesus Oliphant, MD;  Location:  SURGERY CENTER;  Service: ENT;  Laterality: N/A;     FAMILY HISTORY:  Family History  Problem Relation Age of Onset   Diabetes Mother    Heart disease Mother    Heart disease Father      SOCIAL HISTORY:  reports that he has quit smoking. His smoking use included cigarettes. He has been exposed to tobacco smoke. He has never used smokeless tobacco. He reports current alcohol use of about 21.0 standard drinks of alcohol per week. He reports that he does not use drugs.  The patient is divorced and lives in [place]. He is retired from [employer] but enjoys hunting locally and down [place] with friends. He teaches IT consultant education as well.    ALLERGIES: Brilinta  [ticagrelor ], Carvedilol , Lipitor [atorvastatin], and Plavix  [clopidogrel ]   MEDICATIONS:  Current Outpatient Medications  Medication Sig Dispense Refill   capecitabine  (XELODA ) 500 MG tablet Take 3 tablets (1,500 mg total) by mouth 2 (two) times daily after a meal. Take for 14 days, the hold for 7 days, Repeat every 21 days. Start cycle on 04/25/2024 84 tablet 0   cholecalciferol  (VITAMIN D3) 25 MCG (1000 UNIT) tablet Take 1,000 Units by mouth daily.      dabigatran  (PRADAXA ) 150 MG CAPS capsule Take 1 capsule (150 mg total) by mouth 2 (two) times daily.     ketoconazole (NIZORAL) 2 % cream Apply 1 Application topically 2 (two) times daily.     losartan  (COZAAR ) 25 MG tablet Take 0.5 tablets (12.5 mg total) by mouth daily. 45 tablet 3   metoprolol  tartrate (LOPRESSOR ) 25 MG tablet Take 0.5 tablets (12.5 mg total) by mouth 2 (two) times daily.  90 tablet 3   nitroGLYCERIN  (NITROSTAT ) 0.4 MG SL tablet Place 1 tablet (0.4 mg total) under the tongue every 5 (five) minutes as needed for chest pain. 10 tablet 2   ondansetron  (ZOFRAN ) 8 MG tablet Take 1 tablet (8 mg total) by mouth every 8 (eight) hours as needed for nausea. (Patient not taking: Reported on 04/20/2024) 30 tablet 1   oxyCODONE  (OXY IR/ROXICODONE ) 5 MG immediate release tablet Take 1-2 tablets (5-10 mg total) by mouth every 6 (six) hours as needed for severe pain (pain score 7-10). (Patient not taking: Reported on 04/20/2024) 20 tablet 0   polyethylene glycol powder (GLYCOLAX /MIRALAX ) 17 GM/SCOOP powder Take 17 g by mouth daily. (Patient not taking: Reported on 04/20/2024) 476 g 0   rosuvastatin  (CRESTOR ) 20 MG tablet Take 1 tablet (20 mg total) by mouth daily. 90 tablet 3   No current facility-administered medications for this visit.  REVIEW OF SYSTEMS: On review of systems, the patient reports that he is doing okay, but his main complaints are difficulty with palmar-plantar erythrodysesthesia of his palms and soles of his feet. He is struggling with soreness and redness and pain in his hands and feet especially in the mornings. He has been using Urea Cream given during his last med onc appointment without lasting relief. He is doing well with his bowel function and has been hydrating well with active urinary habits. No other complaints are verbalized.     PHYSICAL EXAM:  Pt seen in simulation, vitals not obtained  In general this is a well appearing caucasian male in no acute distress. He's alert and oriented x4 and appropriate throughout the examination. Cardiopulmonary assessment is negative for acute distress and he exhibits normal effort. He has dry hyperpigmentated skin of his palms with dry peeling but no blisters or wounds.     ECOG = 1  0 - Asymptomatic (Fully active, able to carry on all predisease activities without restriction)  1 - Symptomatic but completely  ambulatory (Restricted in physically strenuous activity but ambulatory and able to carry out work of a light or sedentary nature. For example, light housework, office work)  2 - Symptomatic, <50% in bed during the day (Ambulatory and capable of all self care but unable to carry out any work activities. Up and about more than 50% of waking hours)  3 - Symptomatic, >50% in bed, but not bedbound (Capable of only limited self-care, confined to bed or chair 50% or more of waking hours)  4 - Bedbound (Completely disabled. Cannot carry on any self-care. Totally confined to bed or chair)  5 - Death   Raylene MM, Creech RH, Tormey DC, et al. [phone]). Toxicity and response criteria of the [facility]. Am. DOROTHA Bridges. Oncol. 5 (6): 649-55    LABORATORY DATA:  Lab Results  Component Value Date   WBC 6.2 04/19/2024   HGB 13.7 04/19/2024   HCT 40.8 04/19/2024   MCV 90.9 04/19/2024   PLT 150 04/19/2024   Lab Results  Component Value Date   NA 136 04/19/2024   K 5.1 04/19/2024   CL 102 04/19/2024   CO2 26 04/19/2024   Lab Results  Component Value Date   ALT 87 (H) 04/19/2024   AST 69 (H) 04/19/2024   ALKPHOS 67 04/19/2024   BILITOT 1.1 04/19/2024      RADIOGRAPHY: ECHOCARDIOGRAM LIMITED Result Date: 04/20/2024    ECHOCARDIOGRAM LIMITED REPORT   Patient Name:   Steele Stracener. Date of Exam: 04/20/2024 Medical Rec #:  [id number]           Height:       66.5 in Accession #:    [id number]          Weight:       135.8 lb Date of Birth:  09-14-1946           BSA:          1.706 m Patient Age:    78 years            BP:           128/63 mmHg Patient Gender: M                   HR:           54 bpm. Exam Location:  [address] Procedure: 2D Echo, Limited Echo, Limited Color Doppler and Intracardiac  Opacification Agent (Both Spectral and Color Flow Doppler were            utilized during procedure). Indications:    I51.3 LV Mural Thrombus  History:        Patient has  prior history of Echocardiogram examinations, most                 recent 01/28/2024. Previous Myocardial Infarction and CAD; Risk                 Factors:Dyslipidemia.  Sonographer:    Carl Coma RDCS Referring Phys: [id number] GLENDIA DASEN WEAVER IMPRESSIONS  1. No evidence of LV thrombus with use of echo contrast. Left ventricular ejection fraction, by estimation, is 25 to 30%. The left ventricle has severely decreased function. The left ventricle demonstrates global hypokinesis. Comparison(s): Prior images reviewed side by side. LVEF unchanged, no evidence of LV thrombus with echo contrast. FINDINGS  Left Ventricle: No evidence of LV thrombus with use of echo contrast. Left ventricular ejection fraction, by estimation, is 25 to 30%. The left ventricle has severely decreased function. The left ventricle demonstrates global hypokinesis. Definity  contrast agent was given IV to delineate the left ventricular endocardial borders. LEFT VENTRICLE PLAX 2D LVIDd:         5.00 cm Diastology LVIDs:         4.30 cm LV e' medial:    5.87 cm/s LV PW:         1.00 cm LV E/e' medial:  9.9 LV IVS:        0.90 cm LV e' lateral:   9.46 cm/s                        LV E/e' lateral: 6.2  RIGHT VENTRICLE RV S prime:     8.54 cm/s TAPSE (M-mode): 1.9 cm LEFT ATRIUM         Index LA diam:    4.20 cm 2.46 cm/m   AORTA Ao Root diam: 3.60 cm MITRAL VALVE MV Area (PHT): 2.46 cm MV Decel Time: 308 msec MV E velocity: 58.40 cm/s MV A velocity: 87.25 cm/s MV E/A ratio:  0.67 Shelda Bruckner MD Electronically signed by Shelda Bruckner MD Signature Date/Time: 04/20/2024/9:27:42 PM    Final        IMPRESSION/PLAN: 1. Stage IIB, [id number], adenocarcinoma of the sigmoid colon with positive distal margins and LVI. Dr. Dewey and I reviewed the rationale for radiotherapy as having positive margins increases the long term risks of local recurrence. Dr. Dewey would recommend proceeding with chemoradiation.  We discussed the risks,  benefits, short, and long term effects of radiotherapy, as well as the curative intent, and the patient is interested in proceeding. Dr. Dewey discusses the delivery and logistics of radiotherapy and recommends 5 1/2 weeks of radiotherapy.  Written consent is obtained and placed in the chart, a copy was provided to the patient. He will simulate today, and we anticipate starting treatment on 05/23/24.  2. Risks of pelvic floor dysfunction from radiotherapy. We discussed the importance of evaluation with physical therapy prior to pelvic radiation, but the patient declines at this time.  3. PPE. The patient's skin is quite dry and erythematous but without blisters or open skin. I encouraged him to also consider a more emollient ointment like vaseline or aquaphor with cotton gloves, and to discuss this further tomorrow with Dr. Cloretta.   In a visit lasting 45 minutes, greater than 50% of the time  was spent face to face discussing the patient's condition, in preparation for the discussion, and coordinating the patient's care.   The above documentation reflects my direct findings during this shared patient visit. Please see the separate note by Dr. Dewey on this date for the remainder of the patient's plan of care.    Donald KYM Husband, [facility]   **Disclaimer: This note was dictated with voice recognition software. Similar sounding words can inadvertently be transcribed and this note may contain transcription errors which may not have been corrected upon publication of note.**

## 2024-05-10 NOTE — Patient Outreach (Signed)
 Complex Care Management   Visit Note  05/10/2024  Name:  [name] [name]. MRN: [id number] DOB: 11-02-1945  Situation: Referral received for Complex Care Management related to Colon Cancer I obtained verbal consent from Patient.  Visit completed with Patient  on the phone  Background:   Past Medical History:  Diagnosis Date   Coronary artery disease    Erectile dysfunction    HFrEF (heart failure with reduced ejection fraction) (HCC)    Echocardiogram 07/14/21:  EF 25, global HK, inf HK worse, Gr 2 DD, normal RVSF, mild to mod MR, AV sclerosis w/o AS Echocardiogram 2/23: EF 25-30, global HK, GLS -12.9, normal RVSF, mild to mod MR, AV sclerosis w/o AS    History of hiatal hernia    a lone time ago   Hyperlipidemia    [place] fracture [facility]) 03/22/2020   Myocardial infarction (HCC)    x 2   Polio     Assessment: Patient Reported Symptoms:  Cognitive Cognitive Status: No symptoms reported Cognitive/Intellectual Conditions Management [RPT]: None reported or documented in medical history or problem list   Health Maintenance Behaviors: Annual physical exam Healing Pattern: Average Health Facilitated by: Rest  Neurological Neurological Review of Symptoms: No symptoms reported Neurological Management Strategies: Routine screening  HEENT HEENT Symptoms Reported: Nasal discharge HEENT Management Strategies: Routine screening HEENT Self-Management Outcome: 4 (good)    Cardiovascular Cardiovascular Symptoms Reported: No symptoms reported Does patient have uncontrolled Hypertension?: No Cardiovascular Self-Management Outcome: 4 (good)  Respiratory Respiratory Symptoms Reported: No symptoms reported Additional Respiratory Details: Reports early more clear sputum Respiratory Self-Management Outcome: 4 (good)  Endocrine Endocrine Symptoms Reported: No symptoms reported Is patient diabetic?: No Endocrine Self-Management Outcome: 4 (good)  Gastrointestinal Gastrointestinal Symptoms  Reported: No symptoms reported Gastrointestinal Management Strategies: Colostomy Gastrointestinal Self-Management Outcome: 4 (good)    Genitourinary Genitourinary Symptoms Reported: No symptoms reported    Integumentary Integumentary Symptoms Reported: Other Other Integumentary Symptoms: Bilateral hands/feet are extremely dry Skin Self-Management Outcome: 4 (good)  Musculoskeletal Musculoskelatal Symptoms Reviewed: No symptoms reported   Falls in the past year?: No Number of falls in past year: 1 or less Was there an injury with Fall?: No Fall Risk Category Calculator: 0 Patient Fall Risk Level: Low Fall Risk Patient at Risk for Falls Due to: No Fall Risks Fall risk Follow up: Falls evaluation completed  Psychosocial Psychosocial Symptoms Reported: No symptoms reported   Major Change/Loss/Stressor/Fears (CP): Denies Techniques to Cope with Loss/Stress/Change: Not applicable Quality of Family Relationships: helpful, involved, supportive Do you feel physically threatened by others?: No    05/10/2024    PHQ2-9 Depression Screening   Little interest or pleasure in doing things Not at all  Feeling down, depressed, or hopeless Not at all  PHQ-2 - Total Score 0  Trouble falling or staying asleep, or sleeping too much    Feeling tired or having little energy    Poor appetite or overeating     Feeling bad about yourself - or that you are a failure or have let yourself or your family down    Trouble concentrating on things, such as reading the newspaper or watching television    Moving or speaking so slowly that other people could have noticed.  Or the opposite - being so fidgety or restless that you have been moving around a lot more than usual    Thoughts that you would be better off dead, or hurting yourself in some way    PHQ2-9 Total Score  If you checked off any problems, how difficult have these problems made it for you to do your work, take care of things at home, or get along  with other people    Depression Interventions/Treatment      There were no vitals filed for this visit.  Medications Reviewed Today     Reviewed by Bertrum Rosina HERO, RN (Registered Nurse) on 05/10/24 at 1400  Med List Status: <None>   Medication Order Taking? Sig Documenting Provider Last Dose Status Informant  capecitabine  (XELODA ) 500 MG tablet [id number]  Take 3 tablets (1,500 mg total) by mouth 2 (two) times daily after a meal. Take for 14 days, the hold for 7 days, Repeat every 21 days. Start cycle on 04/25/2024 Cloretta Arley NOVAK, MD  Active   cholecalciferol  (VITAMIN D3) 25 MCG (1000 UNIT) tablet [id number]  Take 1,000 Units by mouth daily.  [provider]  Active Self, Pharmacy Records  dabigatran  (PRADAXA ) 150 MG CAPS capsule [id number]  Take 1 capsule (150 mg total) by mouth 2 (two) times daily. Lelon Hamilton T, PA-C  Active   ketoconazole (NIZORAL) 2 % cream [id number]  Apply 1 Application topically 2 (two) times daily. [provider]  Active Self, Pharmacy Records  losartan  (COZAAR ) 25 MG tablet [id number]  Take 0.5 tablets (12.5 mg total) by mouth daily. Lelon Hamilton T, PA-C  Active   metoprolol  tartrate (LOPRESSOR ) 25 MG tablet [id number]  Take 0.5 tablets (12.5 mg total) by mouth 2 (two) times daily. Lelon Hamilton T, PA-C  Active   nitroGLYCERIN  (NITROSTAT ) 0.4 MG SL tablet [id number]  Place 1 tablet (0.4 mg total) under the tongue every 5 (five) minutes as needed for chest pain. Merlynn Niki FALCON, FNP  Active Self, Pharmacy Records  ondansetron  (ZOFRAN ) 8 MG tablet [id number]  Take 1 tablet (8 mg total) by mouth every 8 (eight) hours as needed for nausea.  Patient not taking: Reported on 04/20/2024   Cloretta Arley NOVAK, MD  Active   oxyCODONE  (OXY IR/ROXICODONE ) 5 MG immediate release tablet [id number]  Take 1-2 tablets (5-10 mg total) by mouth every 6 (six) hours as needed for severe pain (pain score 7-10).  Patient not taking: Reported on 04/20/2024   Krishnan, Gokul, MD   Active   polyethylene glycol powder (GLYCOLAX /MIRALAX ) 17 GM/SCOOP powder [id number]  Take 17 g by mouth daily.  Patient not taking: Reported on 04/20/2024   Krishnan, Gokul, MD  Active   rosuvastatin  (CRESTOR ) 20 MG tablet [id number]  Take 1 tablet (20 mg total) by mouth daily. Lelon Hamilton DASEN, PA-C  Active   Med List Note Teretha Renaee SAILOR, RPH-CPP 03/08/24 9079): Xeloda  filled at [facility] (Specialty)            Recommendation:   Continue Current Plan of Care  Follow Up Plan:   Telephone follow-up in 1 month  Rosina Bertrum, BSN RN [facility], [facility] RN Care Manager Direct Dial: [phone]  Fax: [phone]

## 2024-05-10 NOTE — Patient Instructions (Signed)
 Visit Information  Thank you for taking time to visit with me today. Please don't hesitate to contact me if I can be of assistance to you before our next scheduled appointment.  Your next care management appointment is by telephone on 06-09-2024 at 1:00 pm  Telephone follow-up in 1 month  Please call the care guide team at [phone] if you need to cancel, schedule, or reschedule an appointment.   Please call the Suicide and Crisis Lifeline: 988 call the [place]  National Suicide Prevention Lifeline: 406-043-0132 or TTY: (845)730-4851 TTY 618-603-6845) to talk to a trained counselor call 1-800-273-TALK (toll free, 24 hour hotline) call the [facility]: 319-069-4009 call 911 if you are experiencing a Mental Health or Behavioral Health Crisis or need someone to talk to.  Rosina Forte, BSN RN [facility], [facility] RN Care Manager Direct Dial: [phone]  Fax: [phone]

## 2024-05-10 NOTE — Addendum Note (Signed)
 Addended by: BERTRUM ROSINA HERO on: 05/10/2024 02:06 PM   Modules accepted: Orders

## 2024-05-11 ENCOUNTER — Inpatient Hospital Stay

## 2024-05-11 ENCOUNTER — Telehealth: Payer: Self-pay | Admitting: Nurse Practitioner

## 2024-05-11 ENCOUNTER — Inpatient Hospital Stay: Admitting: Nurse Practitioner

## 2024-05-11 ENCOUNTER — Inpatient Hospital Stay (HOSPITAL_BASED_OUTPATIENT_CLINIC_OR_DEPARTMENT_OTHER): Admitting: Nurse Practitioner

## 2024-05-11 ENCOUNTER — Encounter: Payer: Self-pay | Admitting: Nurse Practitioner

## 2024-05-11 VITALS — BP 134/73 | HR 61 | Temp 97.9°F | Wt 138.7 lb

## 2024-05-11 DIAGNOSIS — I110 Hypertensive heart disease with heart failure: Secondary | ICD-10-CM | POA: Diagnosis not present

## 2024-05-11 DIAGNOSIS — E785 Hyperlipidemia, unspecified: Secondary | ICD-10-CM | POA: Diagnosis not present

## 2024-05-11 DIAGNOSIS — R911 Solitary pulmonary nodule: Secondary | ICD-10-CM | POA: Diagnosis not present

## 2024-05-11 DIAGNOSIS — C187 Malignant neoplasm of sigmoid colon: Secondary | ICD-10-CM

## 2024-05-11 DIAGNOSIS — Z933 Colostomy status: Secondary | ICD-10-CM | POA: Diagnosis not present

## 2024-05-11 DIAGNOSIS — I509 Heart failure, unspecified: Secondary | ICD-10-CM | POA: Diagnosis not present

## 2024-05-11 DIAGNOSIS — Z9049 Acquired absence of other specified parts of digestive tract: Secondary | ICD-10-CM | POA: Diagnosis not present

## 2024-05-11 DIAGNOSIS — J449 Chronic obstructive pulmonary disease, unspecified: Secondary | ICD-10-CM | POA: Diagnosis not present

## 2024-05-11 LAB — CBC WITH DIFFERENTIAL (CANCER CENTER ONLY)
Abs Immature Granulocytes: 0.09 K/uL — ABNORMAL HIGH (ref 0.00–0.07)
Basophils Absolute: 0 K/uL (ref 0.0–0.1)
Basophils Relative: 1 %
Eosinophils Absolute: 0.3 K/uL (ref 0.0–0.5)
Eosinophils Relative: 4 %
HCT: 40 % (ref 39.0–52.0)
Hemoglobin: 13.7 g/dL (ref 13.0–17.0)
Immature Granulocytes: 1 %
Lymphocytes Relative: 20 %
Lymphs Abs: 1.6 K/uL (ref 0.7–4.0)
MCH: 32.2 pg (ref 26.0–34.0)
MCHC: 34.3 g/dL (ref 30.0–36.0)
MCV: 94.1 fL (ref 80.0–100.0)
Monocytes Absolute: 0.8 K/uL (ref 0.1–1.0)
Monocytes Relative: 10 %
Neutro Abs: 5.1 K/uL (ref 1.7–7.7)
Neutrophils Relative %: 64 %
Platelet Count: 170 K/uL (ref 150–400)
RBC: 4.25 MIL/uL (ref 4.22–5.81)
RDW: 21.7 % — ABNORMAL HIGH (ref 11.5–15.5)
WBC Count: 7.9 K/uL (ref 4.0–10.5)
nRBC: 0 % (ref 0.0–0.2)

## 2024-05-11 LAB — CMP (CANCER CENTER ONLY)
ALT: 56 U/L — ABNORMAL HIGH (ref 0–44)
AST: 46 U/L — ABNORMAL HIGH (ref 15–41)
Albumin: 4.4 g/dL (ref 3.5–5.0)
Alkaline Phosphatase: 73 U/L (ref 38–126)
Anion gap: 10 (ref 5–15)
BUN: 12 mg/dL (ref 8–23)
CO2: 23 mmol/L (ref 22–32)
Calcium: 9.7 mg/dL (ref 8.9–10.3)
Chloride: 104 mmol/L (ref 98–111)
Creatinine: 0.75 mg/dL (ref 0.61–1.24)
GFR, Estimated: >60 mL/min (ref >60)
Glucose, Bld: 94 mg/dL (ref 70–99)
Potassium: 4.8 mmol/L (ref 3.5–5.1)
Sodium: 138 mmol/L (ref 135–145)
Total Bilirubin: 1.2 mg/dL (ref 0.0–1.2)
Total Protein: 6.7 g/dL (ref 6.5–8.1)

## 2024-05-11 MED ORDER — CAPECITABINE 500 MG PO TABS
ORAL_TABLET | ORAL | 0 refills | Status: DC
Start: 2024-05-23 — End: 2024-06-16
  Filled 2024-05-12: qty 112, fill #0
  Filled 2024-05-17: qty 112, 28d supply, fill #0

## 2024-05-11 NOTE — Progress Notes (Addendum)
 [facility]   [phone] Fax:([phone]) [phone]    Patient Care Team: Severa Rock HERO, FNP as PCP - General (Family Medicine) Thukkani, Arun K, MD as PCP - Cardiology (Cardiology) [name] as Physician Assistant (Cardiology) Bertrum Rosina HERO, RN as VBCI Care Management   CHIEF COMPLAINT: Follow up colon cancer   CURRENT THERAPY: 3 cycles Xeloda , followed by chemoradiation with Xeloda , then 3 additional cycles of Xeloda   INTERVAL HISTORY Mr. [name] [name] returns for follow up. Last seen by [name] 8/5. He began cycle 3 Xeloda  8/11, completed pills on 8/23. Main problem is pain in his hands/feet with decreased grip strength. Occasionally he can't walk. Pain is 5/10 but can get up to 9-10 /10 at worst. Topicals temporarily effective. Pain improves on the week off. Otherwise doing ok with good energy/appetite, no n/v, bowels moving. Had a sore under his denture which has resolved.   ROS  All other systems reviewed and negative   Past Medical History:  Diagnosis Date   Coronary artery disease    Erectile dysfunction    HFrEF (heart failure with reduced ejection fraction) (HCC)    Echocardiogram 07/14/21:  EF 25, global HK, inf HK worse, Gr 2 DD, normal RVSF, mild to mod MR, AV sclerosis w/o AS Echocardiogram 2/23: EF 25-30, global HK, GLS -12.9, normal RVSF, mild to mod MR, AV sclerosis w/o AS    History of hiatal hernia    a lone time ago   Hyperlipidemia    [place] fracture [facility]) 03/22/2020   Myocardial infarction (HCC)    x 2   Polio      Past Surgical History:  Procedure Laterality Date   APPENDECTOMY     CARDIAC CATHETERIZATION     has two stents placed   CERVICAL SPINE SURGERY     c3-4, 4-5,  5-6, 8 screws and plates   COLOSTOMY Left 02/01/2024   Procedure: CREATION, COLOSTOMY;  Surgeon: Polly Cordella LABOR, MD;  Location: [facility] OR;  Service: General;  Laterality: Left;   CORONARY STENT INTERVENTION N/A 07/15/2021   Procedure:  CORONARY STENT INTERVENTION;  Surgeon: Court Dorn PARAS, MD;  Location: [facility] INVASIVE CV LAB;  Service: Cardiovascular;  Laterality: N/A;   CORONARY STENT PLACEMENT     forein body removal     metal sharp removed from right leg   LAPAROTOMY N/A 02/01/2024   Procedure: LAPAROTOMY, EXPLORATORY;  Surgeon: Polly Cordella LABOR, MD;  Location: [facility] OR;  Service: General;  Laterality: N/A;  POSSIBLE OSTOMY   LEFT HEART CATH AND CORONARY ANGIOGRAPHY N/A 11/15/2018   Procedure: LEFT HEART CATH AND CORONARY ANGIOGRAPHY;  Surgeon: Mady Bruckner, MD;  Location: [facility] INVASIVE CV LAB;  Service: Cardiovascular;  Laterality: N/A;   LEFT HEART CATH AND CORONARY ANGIOGRAPHY N/A 07/15/2021   Procedure: LEFT HEART CATH AND CORONARY ANGIOGRAPHY;  Surgeon: Court Dorn PARAS, MD;  Location: [facility];  Service: Cardiovascular;  Laterality: N/A;   MANDIBULAR HARDWARE REMOVAL Bilateral 04/23/2020   Procedure: MANDIBULAR HARDWARE REMOVAL;  Surgeon: Jesus Oliphant, MD;  Location: [facility];  Service: ENT;  Laterality: Bilateral;   ORIF MANDIBULAR FRACTURE N/A 03/14/2020   Procedure: OPEN REDUCTION INTERNAL FIXATION (ORIF) MID  FACE FRACTURE, MANDIBULAR FIXATION MODIFIED ARCH BARS;  Surgeon: Jesus Oliphant, MD;  Location: [facility] OR;  Service: ENT;  Laterality: N/A;   PARTIAL COLECTOMY N/A 02/01/2024   Procedure: COLECTOMY, PARTIAL;  Surgeon: Polly Cordella LABOR, MD;  Location: [facility]  OR;  Service: General;  Laterality: N/A;  sigmoid colon   SCAR REVISION N/A 04/23/2020   Procedure: SCAR REVISION/ Upper Lip Repair;  Surgeon: Jesus Oliphant, MD;  Location: [facility];  Service: ENT;  Laterality: N/A;     Outpatient Encounter Medications as of 05/11/2024  Medication Sig   capecitabine  (XELODA ) 500 MG tablet Take 3 tablets (1,500 mg total) by mouth 2 (two) times daily after a meal. Take for 14 days, the hold for 7 days, Repeat every 21 days. Start cycle on 04/25/2024   cholecalciferol  (VITAMIN D3) 25 MCG (1000  UNIT) tablet Take 1,000 Units by mouth daily.    dabigatran  (PRADAXA ) 150 MG CAPS capsule Take 1 capsule (150 mg total) by mouth 2 (two) times daily.   ketoconazole (NIZORAL) 2 % cream Apply 1 Application topically 2 (two) times daily.   losartan  (COZAAR ) 25 MG tablet Take 0.5 tablets (12.5 mg total) by mouth daily.   metoprolol  tartrate (LOPRESSOR ) 25 MG tablet Take 0.5 tablets (12.5 mg total) by mouth 2 (two) times daily.   rosuvastatin  (CRESTOR ) 20 MG tablet Take 1 tablet (20 mg total) by mouth daily.   nitroGLYCERIN  (NITROSTAT ) 0.4 MG SL tablet Place 1 tablet (0.4 mg total) under the tongue every 5 (five) minutes as needed for chest pain. (Patient not taking: Reported on 05/11/2024)   ondansetron  (ZOFRAN ) 8 MG tablet Take 1 tablet (8 mg total) by mouth every 8 (eight) hours as needed for nausea. (Patient not taking: Reported on 05/11/2024)   oxyCODONE  (OXY IR/ROXICODONE ) 5 MG immediate release tablet Take 1-2 tablets (5-10 mg total) by mouth every 6 (six) hours as needed for severe pain (pain score 7-10). (Patient not taking: Reported on 05/11/2024)   polyethylene glycol powder (GLYCOLAX /MIRALAX ) 17 GM/SCOOP powder Take 17 g by mouth daily. (Patient not taking: Reported on 05/11/2024)   No facility-administered encounter medications on file as of 05/11/2024.     Today's Vitals   05/11/24 1203 05/11/24 1209  BP: 134/73   Pulse: 61   Temp: 97.9 F (36.6 C)   TempSrc: Temporal   SpO2: 100%   Weight: 138 lb 11.2 oz (62.9 kg)   PainSc:  0-No pain   Body mass index is 22.05 kg/m.   ECOG PERFORMANCE STATUS: 1 - Symptomatic but completely ambulatory  PHYSICAL EXAM GENERAL:alert, no distress and comfortable SKIN: palms dry, erythema to hands/feet without cracks, blisters or break down. no rash  HEENT: no thrush or ulcers. sclera clear LYMPH:  no palpable cervical or supraclavicular lymphadenopathy  LUNGS: clear with normal breathing effort HEART: regular rate & rhythm, no lower extremity  edema ABDOMEN: abdomen soft, non-tender and normal bowel sounds NEURO: alert & oriented x 3 with fluent speech, no focal motor deficits     CBC    Latest Ref Rng & Units 05/11/2024   11:59 AM 04/19/2024   10:44 AM 03/29/2024   10:16 AM  CBC  WBC 4.0 - 10.5 K/uL 7.9  6.2  5.3   Hemoglobin 13.0 - 17.0 g/dL 86.2  86.2  87.0   Hematocrit 39.0 - 52.0 % 40.0  40.8  38.9   Platelets 150 - 400 K/uL 170  150  179       CMP     Latest Ref Rng & Units 05/11/2024   11:59 AM 04/19/2024   10:44 AM 03/29/2024   10:16 AM  CMP  Glucose 70 - 99 mg/dL 94  98  86   BUN 8 - 23 mg/dL 12  16  15   Creatinine 0.61 - 1.24 mg/dL 9.24  9.23  9.34   Sodium 135 - 145 mmol/L 138  136  140   Potassium 3.5 - 5.1 mmol/L 4.8  5.1  4.2   Chloride 98 - 111 mmol/L 104  102  104   CO2 22 - 32 mmol/L 23  26  26    Calcium  8.9 - 10.3 mg/dL 9.7  9.6  9.3   Total Protein 6.5 - 8.1 g/dL 6.7  6.6  6.4   Total Bilirubin 0.0 - 1.2 mg/dL 1.2  1.1  0.5   Alkaline Phos 38 - 126 U/L 73  67  65   AST 15 - 41 U/L 46  69  31   ALT 0 - 44 U/L 56  87  28       ASSESSMENT & PLAN:  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), status post a sigmoidectomy and end colostomy 02/01/2024 0/11 nodes, positive as enteric and distal margin, macroscopic tumor perforation-carcinoma involves distal disrupted segment CT angio chest/abdomen/pelvis 01/26/2024: Severe emphysema, segmental circumferential sigmoid thickening with associated contained perforation/abscess no adenopathy CT abdomen/pelvis 01/29/2024: Persistent perforated sigmoid diverticulitis with 2 adjacent fluid collections, 1 containing gas CT chest 02/02/2024: Tiny bilateral noncalcified pulmonary nodules nonspecific severe emphysema 03/07/2024-guardant reveal ctDNA not detected Cycle 1 capecitabine  03/14/2024 Cycle 2 capecitabine  04/04/2024 Cycle 3 capecitabine  04/25/2024 PENDING chemoradiation with Xeloda  (1000 mg BID M-F days with radiation) starting 9/8 x 5.5  weeks COPD CAD CHF, possible early LV thrombus on echocardiogram 01/28/2024, LVEF 25-30%-apixaban  Hypertension Hyperlipidemia Pulmonary nodules-nonspecific pulmonary nodules on CT 01/26/2024 and 02/02/2024    Disposition:  Mr. [name] [name] appears stable. He has completed 3 cycles adjuvant Xeloda , tolerating moderately well with hand-foot syndrome. Continue supportive care. Maintains adequate PS. No clinical evidence of recurrence.   Labs reviewed. He will proceed with chemoradiation with Xeloda  starting 9/8 x5.5 weeks. We will reduce the Xeloda  dose to 1000 mg BID on M-F on days with radiation.   We will see him back 9/18 with lab and next guardant reveal.   Patient seen with Dr. Cloretta.   Orders Placed This Encounter  Procedures   CBC with Differential (Cancer Center Only)    Standing Status:   Future    Expiration Date:   05/11/2025   CMP (Cancer Center only)    Standing Status:   Future    Expiration Date:   05/11/2025   Guardant Reveal    Standing Status:   Future    Expected Date:   06/02/2024    Expiration Date:   05/11/2025      All questions were answered. The patient knows to call the clinic with any problems, questions or concerns. No barriers to learning were detected.  Euclid Cassetta K Hatsuko Bizzarro, NP 05/11/2024   This was a shared visit with Colyn Miron.  Mr. Dufault was interviewed and examined.  He has completed 3 cycles of Xeloda .  He has mild-moderate hand/foot syndrome.  He is scheduled to begin adjuvant radiation and concurrent capecitabine  on 05/23/2024.  The hand/foot symptoms should improve over the next 10 days.  We discussed the treatment plan with Mr. Cumbo.  He will discontinue capecitabine  and contact us  if the hand/foot symptoms worsen.  He will take capecitabine  on days of radiation only.  The capecitabine  dose will be adjusted for the concurrent radiation and hand/foot symptoms.  I was present for greater than 50% of today's visit.  I performed medical decision  making.  Arvella  Cloretta, MD

## 2024-05-12 ENCOUNTER — Other Ambulatory Visit: Payer: Self-pay

## 2024-05-12 DIAGNOSIS — I5022 Chronic systolic (congestive) heart failure: Secondary | ICD-10-CM | POA: Diagnosis not present

## 2024-05-12 DIAGNOSIS — C189 Malignant neoplasm of colon, unspecified: Secondary | ICD-10-CM | POA: Diagnosis not present

## 2024-05-12 DIAGNOSIS — I2510 Atherosclerotic heart disease of native coronary artery without angina pectoris: Secondary | ICD-10-CM | POA: Diagnosis not present

## 2024-05-12 DIAGNOSIS — Z433 Encounter for attention to colostomy: Secondary | ICD-10-CM | POA: Diagnosis not present

## 2024-05-12 DIAGNOSIS — Z7901 Long term (current) use of anticoagulants: Secondary | ICD-10-CM | POA: Diagnosis not present

## 2024-05-12 DIAGNOSIS — I739 Peripheral vascular disease, unspecified: Secondary | ICD-10-CM | POA: Diagnosis not present

## 2024-05-12 DIAGNOSIS — D630 Anemia in neoplastic disease: Secondary | ICD-10-CM | POA: Diagnosis not present

## 2024-05-12 DIAGNOSIS — Z955 Presence of coronary angioplasty implant and graft: Secondary | ICD-10-CM | POA: Diagnosis not present

## 2024-05-12 DIAGNOSIS — K5790 Diverticulosis of intestine, part unspecified, without perforation or abscess without bleeding: Secondary | ICD-10-CM | POA: Diagnosis not present

## 2024-05-12 DIAGNOSIS — N529 Male erectile dysfunction, unspecified: Secondary | ICD-10-CM | POA: Diagnosis not present

## 2024-05-12 DIAGNOSIS — Z48815 Encounter for surgical aftercare following surgery on the digestive system: Secondary | ICD-10-CM | POA: Diagnosis not present

## 2024-05-12 DIAGNOSIS — Z87891 Personal history of nicotine dependence: Secondary | ICD-10-CM | POA: Diagnosis not present

## 2024-05-12 DIAGNOSIS — Z6823 Body mass index (BMI) 23.0-23.9, adult: Secondary | ICD-10-CM | POA: Diagnosis not present

## 2024-05-12 DIAGNOSIS — E782 Mixed hyperlipidemia: Secondary | ICD-10-CM | POA: Diagnosis not present

## 2024-05-12 DIAGNOSIS — I110 Hypertensive heart disease with heart failure: Secondary | ICD-10-CM | POA: Diagnosis not present

## 2024-05-12 DIAGNOSIS — I252 Old myocardial infarction: Secondary | ICD-10-CM | POA: Diagnosis not present

## 2024-05-14 DIAGNOSIS — Z7901 Long term (current) use of anticoagulants: Secondary | ICD-10-CM | POA: Diagnosis not present

## 2024-05-14 DIAGNOSIS — E782 Mixed hyperlipidemia: Secondary | ICD-10-CM | POA: Diagnosis not present

## 2024-05-14 DIAGNOSIS — I252 Old myocardial infarction: Secondary | ICD-10-CM | POA: Diagnosis not present

## 2024-05-14 DIAGNOSIS — N529 Male erectile dysfunction, unspecified: Secondary | ICD-10-CM | POA: Diagnosis not present

## 2024-05-14 DIAGNOSIS — Z433 Encounter for attention to colostomy: Secondary | ICD-10-CM | POA: Diagnosis not present

## 2024-05-14 DIAGNOSIS — Z6823 Body mass index (BMI) 23.0-23.9, adult: Secondary | ICD-10-CM | POA: Diagnosis not present

## 2024-05-14 DIAGNOSIS — Z955 Presence of coronary angioplasty implant and graft: Secondary | ICD-10-CM | POA: Diagnosis not present

## 2024-05-14 DIAGNOSIS — I739 Peripheral vascular disease, unspecified: Secondary | ICD-10-CM | POA: Diagnosis not present

## 2024-05-14 DIAGNOSIS — I5022 Chronic systolic (congestive) heart failure: Secondary | ICD-10-CM | POA: Diagnosis not present

## 2024-05-14 DIAGNOSIS — C189 Malignant neoplasm of colon, unspecified: Secondary | ICD-10-CM | POA: Diagnosis not present

## 2024-05-14 DIAGNOSIS — Z87891 Personal history of nicotine dependence: Secondary | ICD-10-CM | POA: Diagnosis not present

## 2024-05-14 DIAGNOSIS — Z48815 Encounter for surgical aftercare following surgery on the digestive system: Secondary | ICD-10-CM | POA: Diagnosis not present

## 2024-05-14 DIAGNOSIS — I110 Hypertensive heart disease with heart failure: Secondary | ICD-10-CM | POA: Diagnosis not present

## 2024-05-14 DIAGNOSIS — K5790 Diverticulosis of intestine, part unspecified, without perforation or abscess without bleeding: Secondary | ICD-10-CM | POA: Diagnosis not present

## 2024-05-14 DIAGNOSIS — I2510 Atherosclerotic heart disease of native coronary artery without angina pectoris: Secondary | ICD-10-CM | POA: Diagnosis not present

## 2024-05-14 DIAGNOSIS — D630 Anemia in neoplastic disease: Secondary | ICD-10-CM | POA: Diagnosis not present

## 2024-05-17 ENCOUNTER — Other Ambulatory Visit: Payer: Self-pay

## 2024-05-17 ENCOUNTER — Other Ambulatory Visit (HOSPITAL_COMMUNITY): Payer: Self-pay

## 2024-05-18 ENCOUNTER — Other Ambulatory Visit: Payer: Self-pay | Admitting: Pharmacy Technician

## 2024-05-18 ENCOUNTER — Other Ambulatory Visit: Payer: Self-pay

## 2024-05-18 ENCOUNTER — Telehealth: Payer: Self-pay | Admitting: *Deleted

## 2024-05-18 NOTE — Telephone Encounter (Signed)
 Per Dr. Cloretta: Dose on Xeloda  will remain 1000 mg bid, but delay start until 05/30/24. OK to have RT x 1 week without the Xeloda . Call back later next week with update. Mr. [name] [name] notified and agrees.

## 2024-05-18 NOTE — Progress Notes (Signed)
 Specialty Pharmacy Refill Coordination Note  [name] [name]. is a 78 y.o. male contacted today regarding refills of specialty medication(s) Capecitabine  (XELODA )   Patient requested Delivery   Delivery date: 05/19/24   Verified address: [address]   [name] [id number]   Medication will be filled on 05/18/24.

## 2024-05-18 NOTE — Telephone Encounter (Signed)
 Scheduled for RT start w/Xeloda  on 05/23/24. [name] [name] is concerned since his hands are no better. Still red and burn and has no grip to pick things up unless he holds a towel or rag. Sore on right thumb has not healed. His feet have improved. Asking if he should still start the Xeloda  on 9/08 w/RT or would it be OK to wait to start it a week later? Would more of a dose reduction be appropriate if not better?

## 2024-05-19 DIAGNOSIS — I2510 Atherosclerotic heart disease of native coronary artery without angina pectoris: Secondary | ICD-10-CM | POA: Diagnosis not present

## 2024-05-19 DIAGNOSIS — I5022 Chronic systolic (congestive) heart failure: Secondary | ICD-10-CM | POA: Diagnosis not present

## 2024-05-19 DIAGNOSIS — I739 Peripheral vascular disease, unspecified: Secondary | ICD-10-CM | POA: Diagnosis not present

## 2024-05-19 DIAGNOSIS — Z7901 Long term (current) use of anticoagulants: Secondary | ICD-10-CM | POA: Diagnosis not present

## 2024-05-19 DIAGNOSIS — I110 Hypertensive heart disease with heart failure: Secondary | ICD-10-CM | POA: Diagnosis not present

## 2024-05-19 DIAGNOSIS — E782 Mixed hyperlipidemia: Secondary | ICD-10-CM | POA: Diagnosis not present

## 2024-05-19 DIAGNOSIS — Z955 Presence of coronary angioplasty implant and graft: Secondary | ICD-10-CM | POA: Diagnosis not present

## 2024-05-19 DIAGNOSIS — Z87891 Personal history of nicotine dependence: Secondary | ICD-10-CM | POA: Diagnosis not present

## 2024-05-19 DIAGNOSIS — C189 Malignant neoplasm of colon, unspecified: Secondary | ICD-10-CM | POA: Diagnosis not present

## 2024-05-19 DIAGNOSIS — Z433 Encounter for attention to colostomy: Secondary | ICD-10-CM | POA: Diagnosis not present

## 2024-05-19 DIAGNOSIS — D630 Anemia in neoplastic disease: Secondary | ICD-10-CM | POA: Diagnosis not present

## 2024-05-19 DIAGNOSIS — Z6823 Body mass index (BMI) 23.0-23.9, adult: Secondary | ICD-10-CM | POA: Diagnosis not present

## 2024-05-19 DIAGNOSIS — K5790 Diverticulosis of intestine, part unspecified, without perforation or abscess without bleeding: Secondary | ICD-10-CM | POA: Diagnosis not present

## 2024-05-19 DIAGNOSIS — Z48815 Encounter for surgical aftercare following surgery on the digestive system: Secondary | ICD-10-CM | POA: Diagnosis not present

## 2024-05-19 DIAGNOSIS — I252 Old myocardial infarction: Secondary | ICD-10-CM | POA: Diagnosis not present

## 2024-05-19 DIAGNOSIS — N529 Male erectile dysfunction, unspecified: Secondary | ICD-10-CM | POA: Diagnosis not present

## 2024-05-20 ENCOUNTER — Ambulatory Visit
Admission: RE | Admit: 2024-05-20 | Discharge: 2024-05-20 | Disposition: A | Discharge status: Home / Self Care | Source: Ambulatory Visit | Attending: Radiation Oncology | Admitting: Radiation Oncology

## 2024-05-20 ENCOUNTER — Other Ambulatory Visit: Payer: Self-pay

## 2024-05-20 DIAGNOSIS — C187 Malignant neoplasm of sigmoid colon: Secondary | ICD-10-CM | POA: Diagnosis not present

## 2024-05-20 NOTE — Patient Outreach (Signed)
 Complex Care Management   Visit Note  05/20/2024  Name:  [name] [name]. MRN: [id number] DOB: 1945-12-26  Situation: Referral received for Complex Care Management related to SDOH Barriers:  Housing tax assistance Personal care and vision provider. I obtained verbal consent from Patient.  Visit completed with Patient  on the phone  Background:   Past Medical History:  Diagnosis Date   Coronary artery disease    Erectile dysfunction    HFrEF (heart failure with reduced ejection fraction) (HCC)    Echocardiogram 07/14/21:  EF 25, global HK, inf HK worse, Gr 2 DD, normal RVSF, mild to mod MR, AV sclerosis w/o AS Echocardiogram 2/23: EF 25-30, global HK, GLS -12.9, normal RVSF, mild to mod MR, AV sclerosis w/o AS    History of hiatal hernia    a lone time ago   Hyperlipidemia    [place] fracture [facility]) 03/22/2020   Myocardial infarction (HCC)    x 2   Polio     Assessment:  Patient reports that he is able to perform ADL's and doesnot need help at this time. Patient needs vision options and assistance with taxes. SW t/c BCBS and patient is eligible for $300 for glasses. Patient is provided the contact number to obtain a list of providers in his area. Patient is also informed of 60 hrs of Personal care coverage if needed in the future. Patient is provided the contact number for [facility] Exclusion to apply for property tax assistance. Patient does not request assistance with calling for more information.   SDOH Interventions    Flowsheet Row Patient Outreach Telephone from 05/20/2024 in [place] [facility]PART[facility] Patient Outreach Telephone from 04/20/2024 in [place] [facility] Office Visit from 02/24/2024 in [facility] Office Visit from 02/22/2024 in [facility] [place] Clinical Support from 02/16/2024 in [facility] Telephone from 02/12/2024 in  [place] POPULATION HEALTH DEPARTMENT  SDOH Interventions        Food Insecurity Interventions Intervention Not Indicated Intervention Not Indicated -- -- Intervention Not Indicated Intervention Not Indicated  Housing Interventions Intervention Not Indicated  [Owns his home] Intervention Not Indicated -- -- Community Resources Provided  [employer] Intervention Not Indicated  Transportation Interventions Intervention Not Indicated  [Has a car] Intervention Not Indicated -- -- Intervention Not Indicated Intervention Not Indicated  Utilities Interventions Intervention Not Indicated Intervention Not Indicated -- -- Intervention Not Indicated Intervention Not Indicated  Alcohol Usage Interventions -- Intervention Not Indicated (Score <7) -- -- Intervention Not Indicated (Score <7) --  Depression Interventions/Treatment  -- -- EYV7-0 Score <4 Follow-up Not Indicated -- PHQ2-9 Score <4 Follow-up Not Indicated --  Financial Strain Interventions Intervention Not Indicated -- -- -- Intervention Not Indicated --  Physical Activity Interventions -- -- -- -- Intervention Not Indicated --  Stress Interventions -- -- -- Other (Comment)  [SW referral] Intervention Not Indicated --  Social Connections Interventions -- -- -- -- Intervention Not Indicated --  Health Literacy Interventions -- -- -- -- Intervention Not Indicated --      Recommendation:   none  Follow Up Plan:   Telephone follow up appointment date/time:  06/07/24 at 11am  Tillman Gardener, BSW [place]  [facility], [facility] Social Worker Direct Dial: [phone]  Fax: [phone] Website: [email or web address]

## 2024-05-20 NOTE — Patient Instructions (Signed)
 Visit Information  Thank you for taking time to visit with me today. Please don't hesitate to contact me if I can be of assistance to you before our next scheduled appointment.  Our next appointment is by telephone on 06/07/24 at 11am  Please call the care guide team at [phone] if you need to cancel or reschedule your appointment.   Following is a copy of your care plan:   Goals Addressed             This Visit's Progress    BSW VBCI Social Work Care Plan       Problems:   Housing  and Glasses and Personal Care  CSW Clinical Goal(s):   Over the next 2 weeks the Patient will will follow up with [facility] Exclusion [phone] and Insurance provider for in network eye care providers [phone] as directed by Social Work.  Interventions:  Social Determinants of Health in Patient with HTN: SDOH assessments completed: Housing  and Vision provider and personal care Evaluation of current treatment plan related to unmet needs Patient reports that he is able to perform ADL's and doesnot need help at this time.  Patient needs vision options and assistance with taxes.  SW t/c BCBS and patient is eligible for $300 for glasses.  Patient is provided the contact number to obtain a list of providers in his area.  Patient is also informed of 60 hrs of Personal care coverage if needed in the future.  Patient is provided the contact number for [facility] Exclusion to apply for property tax assistance.  Patient does not request assistance with calling for more information.  Patient Goals/Self-Care Activities:  Patient will follow up with vision options through his insurance and [facility] Exclusion for tax assistance.  Plan:   Telephone follow up appointment with care management team member scheduled for:  06/07/24 at 11am        Please call 911 if you are experiencing a Mental Health or Behavioral Health Crisis or need someone to talk to.  Patient verbalizes understanding of instructions  and care plan provided today and agrees to view in MyChart. Active MyChart status and patient understanding of how to access instructions and care plan via MyChart confirmed with patient.     Tillman Gardener, BSW [place]  [facility], [facility] Social Worker Direct Dial: [phone]  Fax: [phone] Website: [email or web address]

## 2024-05-23 ENCOUNTER — Other Ambulatory Visit: Payer: Self-pay

## 2024-05-23 DIAGNOSIS — C187 Malignant neoplasm of sigmoid colon: Secondary | ICD-10-CM | POA: Diagnosis not present

## 2024-05-23 LAB — RAD ONC ARIA SESSION SUMMARY
Course Elapsed Days: 0
Plan Fractions Treated to Date: 1
Plan Prescribed Dose Per Fraction: 1.8 Gy
Plan Total Fractions Prescribed: 25
Plan Total Prescribed Dose: 45 Gy
Reference Point Dosage Given to Date: 1.8 Gy
Reference Point Session Dosage Given: 1.8 Gy
Session Number: 1

## 2024-05-24 ENCOUNTER — Ambulatory Visit
Admission: RE | Admit: 2024-05-24 | Discharge: 2024-05-24 | Disposition: A | Discharge status: Home / Self Care | Source: Ambulatory Visit | Attending: Radiation Oncology | Admitting: Radiation Oncology

## 2024-05-24 ENCOUNTER — Other Ambulatory Visit: Payer: Self-pay

## 2024-05-24 DIAGNOSIS — C187 Malignant neoplasm of sigmoid colon: Secondary | ICD-10-CM | POA: Diagnosis not present

## 2024-05-24 DIAGNOSIS — Z510 Encounter for antineoplastic radiation therapy: Secondary | ICD-10-CM | POA: Diagnosis not present

## 2024-05-24 LAB — RAD ONC ARIA SESSION SUMMARY
Course Elapsed Days: 1
Plan Fractions Treated to Date: 2
Plan Prescribed Dose Per Fraction: 1.8 Gy
Plan Total Fractions Prescribed: 25
Plan Total Prescribed Dose: 45 Gy
Reference Point Dosage Given to Date: 3.6 Gy
Reference Point Session Dosage Given: 1.8 Gy
Session Number: 2

## 2024-05-25 ENCOUNTER — Ambulatory Visit
Admission: RE | Admit: 2024-05-25 | Discharge: 2024-05-25 | Disposition: A | Discharge status: Home / Self Care | Source: Ambulatory Visit | Attending: Radiation Oncology | Admitting: Radiation Oncology

## 2024-05-25 ENCOUNTER — Other Ambulatory Visit: Payer: Self-pay

## 2024-05-25 DIAGNOSIS — C187 Malignant neoplasm of sigmoid colon: Secondary | ICD-10-CM | POA: Diagnosis not present

## 2024-05-25 LAB — RAD ONC ARIA SESSION SUMMARY
Course Elapsed Days: 2
Plan Fractions Treated to Date: 3
Plan Prescribed Dose Per Fraction: 1.8 Gy
Plan Total Fractions Prescribed: 25
Plan Total Prescribed Dose: 45 Gy
Reference Point Dosage Given to Date: 5.4 Gy
Reference Point Session Dosage Given: 1.8 Gy
Session Number: 3

## 2024-05-26 ENCOUNTER — Ambulatory Visit: Admitting: Oncology

## 2024-05-26 ENCOUNTER — Ambulatory Visit
Admission: RE | Admit: 2024-05-26 | Discharge: 2024-05-26 | Disposition: A | Discharge status: Home / Self Care | Source: Ambulatory Visit | Attending: Radiation Oncology | Admitting: Radiation Oncology

## 2024-05-26 ENCOUNTER — Other Ambulatory Visit

## 2024-05-26 ENCOUNTER — Other Ambulatory Visit: Payer: Self-pay

## 2024-05-26 DIAGNOSIS — H52223 Regular astigmatism, bilateral: Secondary | ICD-10-CM | POA: Diagnosis not present

## 2024-05-26 DIAGNOSIS — C187 Malignant neoplasm of sigmoid colon: Secondary | ICD-10-CM | POA: Diagnosis not present

## 2024-05-26 LAB — RAD ONC ARIA SESSION SUMMARY
Course Elapsed Days: 3
Plan Fractions Treated to Date: 4
Plan Prescribed Dose Per Fraction: 1.8 Gy
Plan Total Fractions Prescribed: 25
Plan Total Prescribed Dose: 45 Gy
Reference Point Dosage Given to Date: 7.2 Gy
Reference Point Session Dosage Given: 1.8 Gy
Session Number: 4

## 2024-05-27 ENCOUNTER — Ambulatory Visit
Admission: RE | Admit: 2024-05-27 | Discharge: 2024-05-27 | Disposition: A | Discharge status: Home / Self Care | Source: Ambulatory Visit | Attending: Radiation Oncology | Admitting: Radiation Oncology

## 2024-05-27 ENCOUNTER — Other Ambulatory Visit: Payer: Self-pay

## 2024-05-27 DIAGNOSIS — C187 Malignant neoplasm of sigmoid colon: Secondary | ICD-10-CM | POA: Diagnosis not present

## 2024-05-27 LAB — RAD ONC ARIA SESSION SUMMARY
Course Elapsed Days: 4
Plan Fractions Treated to Date: 5
Plan Prescribed Dose Per Fraction: 1.8 Gy
Plan Total Fractions Prescribed: 25
Plan Total Prescribed Dose: 45 Gy
Reference Point Dosage Given to Date: 9 Gy
Reference Point Session Dosage Given: 1.8 Gy
Session Number: 5

## 2024-05-30 ENCOUNTER — Ambulatory Visit
Admission: RE | Admit: 2024-05-30 | Discharge: 2024-05-30 | Disposition: A | Discharge status: Home / Self Care | Source: Ambulatory Visit | Attending: Radiation Oncology | Admitting: Radiation Oncology

## 2024-05-30 ENCOUNTER — Other Ambulatory Visit: Payer: Self-pay

## 2024-05-30 DIAGNOSIS — C187 Malignant neoplasm of sigmoid colon: Secondary | ICD-10-CM | POA: Diagnosis not present

## 2024-05-30 LAB — RAD ONC ARIA SESSION SUMMARY
Course Elapsed Days: 7
Plan Fractions Treated to Date: 6
Plan Prescribed Dose Per Fraction: 1.8 Gy
Plan Total Fractions Prescribed: 25
Plan Total Prescribed Dose: 45 Gy
Reference Point Dosage Given to Date: 10.8 Gy
Reference Point Session Dosage Given: 1.8 Gy
Session Number: 6

## 2024-05-31 ENCOUNTER — Ambulatory Visit
Admission: RE | Admit: 2024-05-31 | Discharge: 2024-05-31 | Disposition: A | Discharge status: Home / Self Care | Source: Ambulatory Visit | Attending: Radiation Oncology | Admitting: Radiation Oncology

## 2024-05-31 ENCOUNTER — Other Ambulatory Visit: Payer: Self-pay

## 2024-05-31 DIAGNOSIS — Z510 Encounter for antineoplastic radiation therapy: Secondary | ICD-10-CM | POA: Diagnosis not present

## 2024-05-31 DIAGNOSIS — C187 Malignant neoplasm of sigmoid colon: Secondary | ICD-10-CM | POA: Diagnosis not present

## 2024-05-31 LAB — RAD ONC ARIA SESSION SUMMARY
Course Elapsed Days: 8
Plan Fractions Treated to Date: 7
Plan Prescribed Dose Per Fraction: 1.8 Gy
Plan Total Fractions Prescribed: 25
Plan Total Prescribed Dose: 45 Gy
Reference Point Dosage Given to Date: 12.6 Gy
Reference Point Session Dosage Given: 1.8 Gy
Session Number: 7

## 2024-06-01 ENCOUNTER — Other Ambulatory Visit: Payer: Self-pay

## 2024-06-01 ENCOUNTER — Ambulatory Visit
Admission: RE | Admit: 2024-06-01 | Discharge: 2024-06-01 | Disposition: A | Discharge status: Home / Self Care | Source: Ambulatory Visit | Attending: Radiation Oncology | Admitting: Radiation Oncology

## 2024-06-01 DIAGNOSIS — C187 Malignant neoplasm of sigmoid colon: Secondary | ICD-10-CM | POA: Diagnosis not present

## 2024-06-01 LAB — RAD ONC ARIA SESSION SUMMARY
Course Elapsed Days: 9
Plan Fractions Treated to Date: 8
Plan Prescribed Dose Per Fraction: 1.8 Gy
Plan Total Fractions Prescribed: 25
Plan Total Prescribed Dose: 45 Gy
Reference Point Dosage Given to Date: 14.4 Gy
Reference Point Session Dosage Given: 1.8 Gy
Session Number: 8
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
## 2024-06-02 ENCOUNTER — Inpatient Hospital Stay (HOSPITAL_BASED_OUTPATIENT_CLINIC_OR_DEPARTMENT_OTHER): Admitting: Nurse Practitioner

## 2024-06-02 ENCOUNTER — Encounter: Payer: Self-pay | Admitting: Nurse Practitioner

## 2024-06-02 ENCOUNTER — Inpatient Hospital Stay: Attending: Oncology

## 2024-06-02 ENCOUNTER — Ambulatory Visit
Admission: RE | Admit: 2024-06-02 | Discharge: 2024-06-02 | Disposition: A | Discharge status: Home / Self Care | Source: Ambulatory Visit | Attending: Radiation Oncology | Admitting: Radiation Oncology

## 2024-06-02 ENCOUNTER — Other Ambulatory Visit: Payer: Self-pay

## 2024-06-02 VITALS — BP 115/57 | HR 57 | Temp 97.9°F | Resp 16 | Wt 142.2 lb

## 2024-06-02 DIAGNOSIS — Z933 Colostomy status: Secondary | ICD-10-CM | POA: Insufficient documentation

## 2024-06-02 DIAGNOSIS — R918 Other nonspecific abnormal finding of lung field: Secondary | ICD-10-CM | POA: Diagnosis not present

## 2024-06-02 DIAGNOSIS — I2510 Atherosclerotic heart disease of native coronary artery without angina pectoris: Secondary | ICD-10-CM | POA: Diagnosis not present

## 2024-06-02 DIAGNOSIS — C187 Malignant neoplasm of sigmoid colon: Secondary | ICD-10-CM | POA: Diagnosis not present

## 2024-06-02 DIAGNOSIS — J449 Chronic obstructive pulmonary disease, unspecified: Secondary | ICD-10-CM | POA: Insufficient documentation

## 2024-06-02 DIAGNOSIS — I10 Essential (primary) hypertension: Secondary | ICD-10-CM | POA: Diagnosis not present

## 2024-06-02 DIAGNOSIS — L271 Localized skin eruption due to drugs and medicaments taken internally: Secondary | ICD-10-CM | POA: Diagnosis not present

## 2024-06-02 DIAGNOSIS — E785 Hyperlipidemia, unspecified: Secondary | ICD-10-CM | POA: Diagnosis not present

## 2024-06-02 LAB — CBC WITH DIFFERENTIAL (CANCER CENTER ONLY)
Abs Immature Granulocytes: 0.03 K/uL (ref 0.00–0.07)
Basophils Absolute: 0 K/uL (ref 0.0–0.1)
Basophils Relative: 0 %
Eosinophils Absolute: 0.3 K/uL (ref 0.0–0.5)
Eosinophils Relative: 8 %
HCT: 36.6 % — ABNORMAL LOW (ref 39.0–52.0)
Hemoglobin: 12.6 g/dL — ABNORMAL LOW (ref 13.0–17.0)
Immature Granulocytes: 1 %
Lymphocytes Relative: 17 %
Lymphs Abs: 0.7 K/uL (ref 0.7–4.0)
MCH: 33.1 pg (ref 26.0–34.0)
MCHC: 34.4 g/dL (ref 30.0–36.0)
MCV: 96.1 fL (ref 80.0–100.0)
Monocytes Absolute: 0.4 K/uL (ref 0.1–1.0)
Monocytes Relative: 10 %
Neutro Abs: 2.6 K/uL (ref 1.7–7.7)
Neutrophils Relative %: 64 %
Platelet Count: 133 K/uL — ABNORMAL LOW (ref 150–400)
RBC: 3.81 MIL/uL — ABNORMAL LOW (ref 4.22–5.81)
RDW: 18.5 % — ABNORMAL HIGH (ref 11.5–15.5)
WBC Count: 4 K/uL (ref 4.0–10.5)
nRBC: 0 % (ref 0.0–0.2)

## 2024-06-02 LAB — RAD ONC ARIA SESSION SUMMARY
Course Elapsed Days: 10
Plan Fractions Treated to Date: 9
Plan Prescribed Dose Per Fraction: 1.8 Gy
Plan Total Fractions Prescribed: 25
Plan Total Prescribed Dose: 45 Gy
Reference Point Dosage Given to Date: 16.2 Gy
Reference Point Session Dosage Given: 1.8 Gy
Session Number: 9

## 2024-06-02 LAB — CMP (CANCER CENTER ONLY)
ALT: 35 U/L (ref 0–44)
AST: 33 U/L (ref 15–41)
Albumin: 4.1 g/dL (ref 3.5–5.0)
Alkaline Phosphatase: 63 U/L (ref 38–126)
Anion gap: 9 (ref 5–15)
BUN: 12 mg/dL (ref 8–23)
CO2: 26 mmol/L (ref 22–32)
Calcium: 9.4 mg/dL (ref 8.9–10.3)
Chloride: 102 mmol/L (ref 98–111)
Creatinine: 0.65 mg/dL (ref 0.61–1.24)
GFR, Estimated: >60 mL/min (ref >60)
Glucose, Bld: 82 mg/dL (ref 70–99)
Potassium: 4.1 mmol/L (ref 3.5–5.1)
Sodium: 137 mmol/L (ref 135–145)
Total Bilirubin: 0.9 mg/dL (ref 0.0–1.2)
Total Protein: 6.2 g/dL — ABNORMAL LOW (ref 6.5–8.1)

## 2024-06-03 ENCOUNTER — Ambulatory Visit
Admission: RE | Admit: 2024-06-03 | Discharge: 2024-06-03 | Disposition: A | Discharge status: Home / Self Care | Source: Ambulatory Visit | Attending: Radiation Oncology | Admitting: Radiation Oncology

## 2024-06-03 ENCOUNTER — Other Ambulatory Visit: Payer: Self-pay

## 2024-06-03 DIAGNOSIS — C187 Malignant neoplasm of sigmoid colon: Secondary | ICD-10-CM | POA: Diagnosis not present

## 2024-06-03 LAB — RAD ONC ARIA SESSION SUMMARY
Course Elapsed Days: 11
Plan Fractions Treated to Date: 10
Plan Prescribed Dose Per Fraction: 1.8 Gy
Plan Total Fractions Prescribed: 25
Plan Total Prescribed Dose: 45 Gy
Reference Point Dosage Given to Date: 18 Gy
Reference Point Session Dosage Given: 1.8 Gy
Session Number: 10

## 2024-06-06 ENCOUNTER — Other Ambulatory Visit: Payer: Self-pay

## 2024-06-06 ENCOUNTER — Ambulatory Visit
Admission: RE | Admit: 2024-06-06 | Discharge: 2024-06-06 | Disposition: A | Discharge status: Home / Self Care | Source: Ambulatory Visit | Attending: Radiation Oncology | Admitting: Radiation Oncology

## 2024-06-06 DIAGNOSIS — C187 Malignant neoplasm of sigmoid colon: Secondary | ICD-10-CM | POA: Diagnosis not present

## 2024-06-06 LAB — RAD ONC ARIA SESSION SUMMARY
Course Elapsed Days: 14
Plan Fractions Treated to Date: 11
Plan Prescribed Dose Per Fraction: 1.8 Gy
Plan Total Fractions Prescribed: 25
Plan Total Prescribed Dose: 45 Gy
Reference Point Dosage Given to Date: 19.8 Gy
Reference Point Session Dosage Given: 1.8 Gy
Session Number: 11

## 2024-06-07 ENCOUNTER — Other Ambulatory Visit: Payer: Self-pay

## 2024-06-07 ENCOUNTER — Ambulatory Visit

## 2024-06-07 DIAGNOSIS — C187 Malignant neoplasm of sigmoid colon: Secondary | ICD-10-CM | POA: Diagnosis not present

## 2024-06-07 NOTE — Patient Instructions (Signed)

## 2024-06-07 NOTE — Patient Outreach (Signed)
 Complex Care Management   Visit Note  06/07/2024  Name:  [name] [name]. MRN: [id number] DOB: 05/17/1946  Situation: Referral received for Complex Care Management related to SDOH Barriers:  Vision and tax assistance. I obtained verbal consent from Patient.  Visit completed with Patient  on the phone  Background:   Past Medical History:  Diagnosis Date   Coronary artery disease    Erectile dysfunction    HFrEF (heart failure with reduced ejection fraction) (HCC)    Echocardiogram 07/14/21:  EF 25, global HK, inf HK worse, Gr 2 DD, normal RVSF, mild to mod MR, AV sclerosis w/o AS Echocardiogram 2/23: EF 25-30, global HK, GLS -12.9, normal RVSF, mild to mod MR, AV sclerosis w/o AS    History of hiatal hernia    a lone time ago   Hyperlipidemia    [place] fracture [facility]) 03/22/2020   Myocardial infarction (HCC)    x 2   Polio     Assessment:  Patient reports he was told the glasses cost $600. Patient has $300 on his Mastercard from his insurance. SW t/c [facility] in [place]. There was confusion about the charges. Patients glasses are $260 with a $45 balance. Adding transition is $150 after the $300 deduction. Patient can afford the $150 for his glasses. Patient also contacted the tax department and was told to pick up paperwork. Patient is overwhelmed by the amount of paperwork needed to apply. SW encourages patient to continue with the process to see if he is eligible. The paperwork must be received by January. Patient does not request a follow up.   SDOH Interventions    Flowsheet Row Patient Outreach Telephone from 05/20/2024 in [place] POPULATION HEALTH DEPARTMENT Patient Outreach Telephone from 04/20/2024 in [place] [facility] POPULATION HEALTH DEPARTMENT Office Visit from 02/24/2024 in [facility] Office Visit from 02/22/2024 in [facility] [place] Clinical Support from 02/16/2024 in [facility] Telephone from 02/12/2024 in [place] POPULATION HEALTH DEPARTMENT  SDOH Interventions        Food Insecurity Interventions Intervention Not Indicated Intervention Not Indicated -- -- Intervention Not Indicated Intervention Not Indicated  Housing Interventions Intervention Not Indicated  [Owns his home] Intervention Not Indicated -- -- Community Resources Provided  [employer] Intervention Not Indicated  Transportation Interventions Intervention Not Indicated  [Has a car] Intervention Not Indicated -- -- Intervention Not Indicated Intervention Not Indicated  Utilities Interventions Intervention Not Indicated Intervention Not Indicated -- -- Intervention Not Indicated Intervention Not Indicated  Alcohol Usage Interventions -- Intervention Not Indicated (Score <7) -- -- Intervention Not Indicated (Score <7) --  Depression Interventions/Treatment  -- -- EYV7-0 Score <4 Follow-up Not Indicated -- PHQ2-9 Score <4 Follow-up Not Indicated --  Financial Strain Interventions Intervention Not Indicated -- -- -- Intervention Not Indicated --  Physical Activity Interventions -- -- -- -- Intervention Not Indicated --  Stress Interventions -- -- -- Other (Comment)  [SW referral] Intervention Not Indicated --  Social Connections Interventions -- -- -- -- Intervention Not Indicated --  Health Literacy Interventions -- -- -- -- Intervention Not Indicated --    Recommendation:   None  Follow Up Plan:   Patient has met all care management goals. Care Management case will be closed. Patient has been provided contact information should new needs arise.   Tillman Gardener, BSW [place]  [facility], [facility] Social  Worker Warehouse manager: [phone]  Fax: [phone] Website: [email or web address]

## 2024-06-08 ENCOUNTER — Ambulatory Visit
Admission: RE | Admit: 2024-06-08 | Discharge: 2024-06-08 | Disposition: A | Discharge status: Home / Self Care | Source: Ambulatory Visit | Attending: Radiation Oncology | Admitting: Radiation Oncology

## 2024-06-08 ENCOUNTER — Other Ambulatory Visit: Payer: Self-pay

## 2024-06-08 DIAGNOSIS — Z6823 Body mass index (BMI) 23.0-23.9, adult: Secondary | ICD-10-CM | POA: Diagnosis not present

## 2024-06-08 DIAGNOSIS — Z510 Encounter for antineoplastic radiation therapy: Secondary | ICD-10-CM | POA: Diagnosis not present

## 2024-06-08 DIAGNOSIS — D630 Anemia in neoplastic disease: Secondary | ICD-10-CM | POA: Diagnosis not present

## 2024-06-08 DIAGNOSIS — Z7901 Long term (current) use of anticoagulants: Secondary | ICD-10-CM | POA: Diagnosis not present

## 2024-06-08 DIAGNOSIS — N529 Male erectile dysfunction, unspecified: Secondary | ICD-10-CM | POA: Diagnosis not present

## 2024-06-08 DIAGNOSIS — E782 Mixed hyperlipidemia: Secondary | ICD-10-CM | POA: Diagnosis not present

## 2024-06-08 DIAGNOSIS — I252 Old myocardial infarction: Secondary | ICD-10-CM | POA: Diagnosis not present

## 2024-06-08 DIAGNOSIS — I110 Hypertensive heart disease with heart failure: Secondary | ICD-10-CM | POA: Diagnosis not present

## 2024-06-08 DIAGNOSIS — K5790 Diverticulosis of intestine, part unspecified, without perforation or abscess without bleeding: Secondary | ICD-10-CM | POA: Diagnosis not present

## 2024-06-08 DIAGNOSIS — I2510 Atherosclerotic heart disease of native coronary artery without angina pectoris: Secondary | ICD-10-CM | POA: Diagnosis not present

## 2024-06-08 DIAGNOSIS — Z433 Encounter for attention to colostomy: Secondary | ICD-10-CM | POA: Diagnosis not present

## 2024-06-08 DIAGNOSIS — Z87891 Personal history of nicotine dependence: Secondary | ICD-10-CM | POA: Diagnosis not present

## 2024-06-08 DIAGNOSIS — Z48815 Encounter for surgical aftercare following surgery on the digestive system: Secondary | ICD-10-CM | POA: Diagnosis not present

## 2024-06-08 DIAGNOSIS — I5022 Chronic systolic (congestive) heart failure: Secondary | ICD-10-CM | POA: Diagnosis not present

## 2024-06-08 DIAGNOSIS — C189 Malignant neoplasm of colon, unspecified: Secondary | ICD-10-CM | POA: Diagnosis not present

## 2024-06-08 DIAGNOSIS — I739 Peripheral vascular disease, unspecified: Secondary | ICD-10-CM | POA: Diagnosis not present

## 2024-06-08 DIAGNOSIS — C187 Malignant neoplasm of sigmoid colon: Secondary | ICD-10-CM | POA: Diagnosis not present

## 2024-06-08 DIAGNOSIS — Z955 Presence of coronary angioplasty implant and graft: Secondary | ICD-10-CM | POA: Diagnosis not present

## 2024-06-08 LAB — RAD ONC ARIA SESSION SUMMARY
Course Elapsed Days: 16
Plan Fractions Treated to Date: 12
Plan Prescribed Dose Per Fraction: 1.8 Gy
Plan Total Fractions Prescribed: 25
Plan Total Prescribed Dose: 45 Gy
Reference Point Dosage Given to Date: 21.6 Gy
Reference Point Session Dosage Given: 1.8 Gy
Session Number: 12

## 2024-06-09 ENCOUNTER — Telehealth: Payer: Self-pay

## 2024-06-09 ENCOUNTER — Ambulatory Visit

## 2024-06-09 ENCOUNTER — Other Ambulatory Visit: Payer: Self-pay | Admitting: *Deleted
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
## 2024-06-09 NOTE — Telephone Encounter (Signed)
 Approval given

## 2024-06-09 NOTE — Patient Instructions (Signed)
 Visit Information  Thank you for taking time to visit with me today. Please don't hesitate to contact me if I can be of assistance to you before our next scheduled appointment.  Your next care management appointment is by telephone on 07-08-2024 at 10:00 am  Telephone follow-up in 1 month  Please call the care guide team at [phone] if you need to cancel, schedule, or reschedule an appointment.   Please call the Suicide and Crisis Lifeline: 988 call the [place]  National Suicide Prevention Lifeline: 870-698-0878 or TTY: (401)384-8445 TTY 229-806-6739) to talk to a trained counselor call 1-800-273-TALK (toll free, 24 hour hotline) if you are experiencing a Mental Health or Behavioral Health Crisis or need someone to talk to.  Rosina Forte, BSN RN [facility], [facility] RN Care Manager Direct Dial: [phone]  Fax: [phone]

## 2024-06-09 NOTE — Telephone Encounter (Signed)
 Copied from CRM #[id number]. Topic: Clinical - Home Health Verbal Orders >> Jun 09, 2024  9:19 AM Gustabo D wrote: Caller/Agency: [facility]- Nurse Nat Rushing Number: [phone] Service Requested: Skilled Nursing Frequency: Once a week for 60 days Any new concerns about the patient? No Pt isn't eating

## 2024-06-10 ENCOUNTER — Telehealth: Payer: Self-pay | Admitting: *Deleted

## 2024-06-10 ENCOUNTER — Other Ambulatory Visit: Payer: Self-pay

## 2024-06-10 ENCOUNTER — Ambulatory Visit
Admission: RE | Admit: 2024-06-10 | Discharge: 2024-06-10 | Disposition: A | Discharge status: Home / Self Care | Source: Ambulatory Visit | Attending: Radiation Oncology | Admitting: Radiation Oncology

## 2024-06-10 DIAGNOSIS — C187 Malignant neoplasm of sigmoid colon: Secondary | ICD-10-CM | POA: Diagnosis not present

## 2024-06-10 DIAGNOSIS — Z510 Encounter for antineoplastic radiation therapy: Secondary | ICD-10-CM | POA: Diagnosis not present

## 2024-06-10 LAB — RAD ONC ARIA SESSION SUMMARY
Course Elapsed Days: 18
Plan Fractions Treated to Date: 13
Plan Prescribed Dose Per Fraction: 1.8 Gy
Plan Total Fractions Prescribed: 25
Plan Total Prescribed Dose: 45 Gy
Reference Point Dosage Given to Date: 23.4 Gy
Reference Point Session Dosage Given: 1.8 Gy
Session Number: 13

## 2024-06-10 NOTE — Telephone Encounter (Signed)
 Called Mr. Hooper with his Guardant Reveal (post surgery program-last draw) results drawn on 06/03/24 that were negative for DNA.

## 2024-06-13 ENCOUNTER — Ambulatory Visit
Admission: RE | Admit: 2024-06-13 | Discharge: 2024-06-13 | Disposition: A | Discharge status: Home / Self Care | Source: Ambulatory Visit | Attending: Radiation Oncology | Admitting: Radiation Oncology

## 2024-06-13 ENCOUNTER — Other Ambulatory Visit: Payer: Self-pay

## 2024-06-13 DIAGNOSIS — Z7901 Long term (current) use of anticoagulants: Secondary | ICD-10-CM | POA: Diagnosis not present

## 2024-06-13 DIAGNOSIS — D630 Anemia in neoplastic disease: Secondary | ICD-10-CM | POA: Diagnosis not present

## 2024-06-13 DIAGNOSIS — I739 Peripheral vascular disease, unspecified: Secondary | ICD-10-CM | POA: Diagnosis not present

## 2024-06-13 DIAGNOSIS — N529 Male erectile dysfunction, unspecified: Secondary | ICD-10-CM | POA: Diagnosis not present

## 2024-06-13 DIAGNOSIS — Z87891 Personal history of nicotine dependence: Secondary | ICD-10-CM | POA: Diagnosis not present

## 2024-06-13 DIAGNOSIS — I110 Hypertensive heart disease with heart failure: Secondary | ICD-10-CM | POA: Diagnosis not present

## 2024-06-13 DIAGNOSIS — Z433 Encounter for attention to colostomy: Secondary | ICD-10-CM | POA: Diagnosis not present

## 2024-06-13 DIAGNOSIS — E782 Mixed hyperlipidemia: Secondary | ICD-10-CM | POA: Diagnosis not present

## 2024-06-13 DIAGNOSIS — I2510 Atherosclerotic heart disease of native coronary artery without angina pectoris: Secondary | ICD-10-CM | POA: Diagnosis not present

## 2024-06-13 DIAGNOSIS — Z510 Encounter for antineoplastic radiation therapy: Secondary | ICD-10-CM | POA: Diagnosis not present

## 2024-06-13 DIAGNOSIS — K5790 Diverticulosis of intestine, part unspecified, without perforation or abscess without bleeding: Secondary | ICD-10-CM | POA: Diagnosis not present

## 2024-06-13 DIAGNOSIS — Z6823 Body mass index (BMI) 23.0-23.9, adult: Secondary | ICD-10-CM | POA: Diagnosis not present

## 2024-06-13 DIAGNOSIS — C187 Malignant neoplasm of sigmoid colon: Secondary | ICD-10-CM | POA: Diagnosis not present

## 2024-06-13 DIAGNOSIS — Z48815 Encounter for surgical aftercare following surgery on the digestive system: Secondary | ICD-10-CM | POA: Diagnosis not present

## 2024-06-13 DIAGNOSIS — Z955 Presence of coronary angioplasty implant and graft: Secondary | ICD-10-CM | POA: Diagnosis not present

## 2024-06-13 DIAGNOSIS — C189 Malignant neoplasm of colon, unspecified: Secondary | ICD-10-CM | POA: Diagnosis not present

## 2024-06-13 DIAGNOSIS — I252 Old myocardial infarction: Secondary | ICD-10-CM | POA: Diagnosis not present

## 2024-06-13 DIAGNOSIS — I5022 Chronic systolic (congestive) heart failure: Secondary | ICD-10-CM | POA: Diagnosis not present

## 2024-06-13 LAB — RAD ONC ARIA SESSION SUMMARY
Course Elapsed Days: 21
Plan Fractions Treated to Date: 14
Plan Prescribed Dose Per Fraction: 1.8 Gy
Plan Total Fractions Prescribed: 25
Plan Total Prescribed Dose: 45 Gy
Reference Point Dosage Given to Date: 25.2 Gy
Reference Point Session Dosage Given: 1.8 Gy
Session Number: 14

## 2024-06-13 NOTE — Progress Notes (Unsigned)
 [facility] [place]  ASSESSMENT / PLAN: [name] [name]. is a 78 y.o. 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 Blood glucose control with goal A1c < 7%. Blood pressure control with goal blood pressure < 130/80 mmHg. Lipid reduction therapy with goal LDL-C < 55 mg/dL. Aspirin  81mg  by mouth daily. Atorvastatin 40-80mg  PO QD (or other high intensity statin therapy). Daily walking to and past the point of discomfort.   Follow-up in 3 months with ABI to evaluate progress with walking regimen.  CHIEF COMPLAINT: Iliac occlusion on CT scan  HISTORY OF PRESENT ILLNESS: [name] [name]. is a 77 y.o. male referred to clinic for evaluation of left external iliac artery occlusion identified incidentally on CT scan of the chest abdomen and pelvis.  This was done for workup of chest and abdominal discomfort on 01/26/2024.  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  Complete excision was achieved.  Patient is due to start chemotherapy and radiation therapy.  He reports cramping discomfort in the left buttock and thigh after more than 100 yards.  This is more noticeable when he walks uphill.  This is not limiting to him.  He does not have any symptoms of rest pain.  He has no symptoms of ischemic ulceration.   Past Medical History:  Diagnosis Date   Coronary artery disease    Erectile dysfunction    HFrEF (heart failure with reduced ejection fraction) (HCC)    Echocardiogram 07/14/21:  EF 25, global HK, inf HK worse, Gr 2 DD, normal RVSF, mild to mod MR, AV sclerosis w/o AS Echocardiogram 2/23: EF 25-30, global HK, GLS -12.9, normal RVSF, mild to mod MR, AV sclerosis w/o AS    History of hiatal hernia    a lone time ago    Hyperlipidemia    [place] fracture [facility]) 03/22/2020   Myocardial infarction (HCC)    x 2   Polio     Past Surgical History:  Procedure Laterality Date   APPENDECTOMY     CARDIAC CATHETERIZATION     has two stents placed   CERVICAL SPINE SURGERY     c3-4, 4-5,  5-6, 8 screws and plates   COLOSTOMY Left 02/01/2024   Procedure: CREATION, COLOSTOMY;  Surgeon: Polly Cordella LABOR, MD;  Location: [facility];  Service: General;  Laterality: Left;   CORONARY STENT INTERVENTION N/A 07/15/2021   Procedure: CORONARY STENT INTERVENTION;  Surgeon: Court Dorn JINNY, MD;  [facility]: [facility];  Service: Cardiovascular;  Laterality: N/A;   CORONARY STENT PLACEMENT     forein body removal     metal sharp removed from right leg   LAPAROTOMY N/A 02/01/2024   Procedure: LAPAROTOMY, EXPLORATORY;  Surgeon: Polly Cordella LABOR, MD;  [facility]: [facility];  Service: General;  Laterality: N/A;  POSSIBLE OSTOMY   LEFT HEART CATH AND CORONARY ANGIOGRAPHY N/A 11/15/2018   Procedure: LEFT HEART CATH AND CORONARY ANGIOGRAPHY;  Surgeon: Mady Bruckner, MD;  [facility]: [facility];  Service: Cardiovascular;  Laterality: N/A;   LEFT HEART CATH AND CORONARY ANGIOGRAPHY N/A 07/15/2021   Procedure: LEFT HEART CATH AND CORONARY ANGIOGRAPHY;  Surgeon: Court Dorn JINNY, MD;  [facility];  Service: Cardiovascular;  Laterality: N/A;   MANDIBULAR HARDWARE REMOVAL Bilateral 04/23/2020  Procedure: MANDIBULAR HARDWARE REMOVAL;  Surgeon: Jesus Oliphant, MD;  Location: [facility];  Service: ENT;  Laterality: Bilateral;   ORIF MANDIBULAR FRACTURE N/A 03/14/2020   Procedure: OPEN REDUCTION INTERNAL FIXATION (ORIF) MID  FACE FRACTURE, MANDIBULAR FIXATION MODIFIED ARCH BARS;  Surgeon: Jesus Oliphant, MD;  Location: [facility] OR;  Service: ENT;  Laterality: N/A;   PARTIAL COLECTOMY N/A 02/01/2024   Procedure: COLECTOMY, PARTIAL;  Surgeon: Polly Cordella LABOR, MD;  [facility];  Service: General;  Laterality: N/A;   sigmoid colon   SCAR REVISION N/A 04/23/2020   Procedure: SCAR REVISION/ Upper Lip Repair;  Surgeon: Jesus Oliphant, MD;  Location: [facility];  Service: ENT;  Laterality: N/A;    Family History  Problem Relation Age of Onset   Diabetes Mother    Heart disease Mother    Heart disease Father     Social History   Socioeconomic History   Marital status: Divorced    Spouse name: Not on file   Number of children: 2   Years of education: 12   Highest education level: 12th grade  Occupational History   Occupation: retired    Comment: farmer  Tobacco Use   Smoking status: Former    Types: Cigarettes    Passive exposure: Past   Smokeless tobacco: Never  Vaping Use   Vaping status: Never Used  Substance and Sexual Activity   Alcohol use: Yes    Alcohol/week: 21.0 standard drinks of alcohol    Types: 21 Cans of beer per week    Comment: 2 beers daily   Drug use: No   Sexual activity: Not on file  Other Topics Concern   Not on file  Social History Narrative   Mr. Godlewski has a restraining order against one of his sons.   Social Drivers of Corporate investment banker Strain: Low Risk  (05/20/2024)   Overall Financial Resource Strain (CARDIA)    Difficulty of Paying Living Expenses: Not hard at all  Food Insecurity: No Food Insecurity (05/20/2024)   Hunger Vital Sign    Worried About Running Out of Food in the Last Year: Never true    Ran Out of Food in the Last Year: Never true  Transportation Needs: No Transportation Needs (05/20/2024)   PRAPARE - Administrator, Civil Service (Medical): No    Lack of Transportation (Non-Medical): No  Physical Activity: Unknown (02/16/2024)   Exercise Vital Sign    Days of Exercise per Week: 0 days    Minutes of Exercise per Session: Not on file  Stress: Stress Concern Present (02/22/2024)   [employer] of Occupational Health - Occupational Stress Questionnaire    Feeling of Stress : To some extent  Social  Connections: Moderately Integrated (02/16/2024)   Social Connection and Isolation Panel    Frequency of Communication with Friends and Family: More than three times a week    Frequency of Social Gatherings with Friends and Family: More than three times a week    Attends Religious Services: More than 4 times per year    Active Member of [employer] or Organizations: Yes    Attends Banker Meetings: More than 4 times per year    Marital Status: Divorced  Intimate Partner Violence: Not At Risk (05/20/2024)   Humiliation, Afraid, Rape, and Kick questionnaire    Fear of Current or Ex-Partner: No    Emotionally Abused: No    Physically Abused: No    Sexually  Abused: No    Allergies  Allergen Reactions   Brilinta  [Ticagrelor ] Shortness Of Breath   Carvedilol  Diarrhea   Lipitor [Atorvastatin] Other (See Comments)    Makes pt feel funky   Plavix  [Clopidogrel ] Rash    Current Outpatient Medications  Medication Sig Dispense Refill   capecitabine  (XELODA ) 500 MG tablet Take 1000 mg AM, Take 1000 mg PM, total dose 2000 mg daily, take on days of radiation only (Patient not taking: Reported on 06/09/2024) 112 tablet 0   cholecalciferol  (VITAMIN D3) 25 MCG (1000 UNIT) tablet Take 1,000 Units by mouth daily.      dabigatran  (PRADAXA ) 150 MG CAPS capsule Take 1 capsule (150 mg total) by mouth 2 (two) times daily.     ketoconazole (NIZORAL) 2 % cream Apply 1 Application topically 2 (two) times daily. (Patient not taking: Reported on 06/09/2024)     losartan  (COZAAR ) 25 MG tablet Take 0.5 tablets (12.5 mg total) by mouth daily. 45 tablet 3   metoprolol  tartrate (LOPRESSOR ) 25 MG tablet Take 0.5 tablets (12.5 mg total) by mouth 2 (two) times daily. 90 tablet 3   nitroGLYCERIN  (NITROSTAT ) 0.4 MG SL tablet Place 1 tablet (0.4 mg total) under the tongue every 5 (five) minutes as needed for chest pain. (Patient not taking: Reported on 06/09/2024) 10 tablet 2   ondansetron  (ZOFRAN ) 8 MG tablet Take 1  tablet (8 mg total) by mouth every 8 (eight) hours as needed for nausea. (Patient not taking: Reported on 06/09/2024) 30 tablet 1   oxyCODONE  (OXY IR/ROXICODONE ) 5 MG immediate release tablet Take 1-2 tablets (5-10 mg total) by mouth every 6 (six) hours as needed for severe pain (pain score 7-10). (Patient not taking: Reported on 06/09/2024) 20 tablet 0   polyethylene glycol powder (GLYCOLAX /MIRALAX ) 17 GM/SCOOP powder Take 17 g by mouth daily. (Patient not taking: Reported on 06/09/2024) 476 g 0   rosuvastatin  (CRESTOR ) 20 MG tablet Take 1 tablet (20 mg total) by mouth daily. 90 tablet 3   No current facility-administered medications for this visit.    PHYSICAL EXAM There were no vitals filed for this visit.   Elderly man in no distress Regular rate and rhythm Unlabored breathing No palpable pedal pulses  PERTINENT LABORATORY AND RADIOLOGIC DATA  Most recent CBC    Latest Ref Rng & Units 06/02/2024   10:35 AM 05/11/2024   11:59 AM 04/19/2024   10:44 AM  CBC  WBC 4.0 - 10.5 K/uL 4.0  7.9  6.2   Hemoglobin 13.0 - 17.0 g/dL 87.3  86.2  86.2   Hematocrit 39.0 - 52.0 % 36.6  40.0  40.8   Platelets 150 - 400 K/uL 133  170  150      Most recent CMP    Latest Ref Rng & Units 06/02/2024   10:35 AM 05/11/2024   11:59 AM 04/19/2024   10:44 AM  CMP  Glucose 70 - 99 mg/dL 82  94  98   BUN 8 - 23 mg/dL 12  12  16    Creatinine 0.61 - 1.24 mg/dL 9.34  9.24  9.23   Sodium 135 - 145 mmol/L 137  138  136   Potassium 3.5 - 5.1 mmol/L 4.1  4.8  5.1   Chloride 98 - 111 mmol/L 102  104  102   CO2 22 - 32 mmol/L 26  23  26    Calcium  8.9 - 10.3 mg/dL 9.4  9.7  9.6   Total Protein 6.5 - 8.1 g/dL 6.2  6.7  6.6   Total Bilirubin 0.0 - 1.2 mg/dL 0.9  1.2  1.1   Alkaline Phos 38 - 126 U/L 63  73  67   AST 15 - 41 U/L 33  46  69   ALT 0 - 44 U/L 35  56  87     Renal function Estimated Creatinine Clearance: 69.4 mL/min (by C-G formula based on SCr of 0.65 mg/dL).  Hgb A1c MFr Bld (%)  Date Value   07/13/2021 5.2    LDL Chol Calc (NIH)  Date Value Ref Range Status  02/24/2024 83 0 - 99 mg/dL Final     +-------+-----------+-----------+------------+------------+  ABI/TBIToday's ABIToday's TBIPrevious ABIPrevious TBI  +-------+-----------+-----------+------------+------------+  Right 0.93       0.77                                 +-------+-----------+-----------+------------+------------+  Left  0.57       0.63                                 +-------+-----------+-----------+------------+------------+   CT angiogram of abdomen / pelvis:  IMPRESSION: Vascular:   1. No evidence of thoracoabdominal aortic aneurysm or dissection. 2. No evidence of pulmonary embolus. 3. Aortic Atherosclerosis (ICD10-I70.0). Coronary artery atherosclerosis. 4. 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 5. 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 6. Extensive atherosclerosis of the right iliac system, with stenosis estimated at 50-70% throughout the right external iliac artery.   Nonvascular:   1. Segmental concentric mural thickening of the mid sigmoid colon, with associated contained perforation. Findings are compatible with perforated diverticulitis and diverticular abscess, though close follow-up is recommended to document resolution and exclude underlying neoplasm. 2. Bilateral bronchial wall thickening, greatest in the lower lobes, consistent with bronchitis or reactive airway disease. No acute airspace disease. 3.  Emphysema (ICD10-J43.9). 4. Moderate retained stool throughout the colon compatible with constipation. No obstruction or ileus. 5. Small bilateral fat containing inguinal hernias. 6. Multiple pulmonary nodules. Most significant: Right solid pulmonary nodule within the upper  lobe measuring 4 mm. Per Fleischner Society Guidelines,if patient is low risk for malignancy, no routine follow-up imaging is recommended. If patient is high risk for malignancy, a non-contrast Chest CT at 12 months is optional. If performed and the nodule is stable at 12 months, no further follow-up is recommended. These guidelines do not apply to immunocompromised patients and patients with cancer. Follow up in patients with significant comorbidities as clinically warranted. For lung cancer screening, adhere to Lung-RADS guidelines. Reference: Radiology. 2017; 284(1):228-43.   Critical Value/emergent results were called by telephone at the time of interpretation on 01/26/2024 at 3:09 pm to provider [facility] , who verbally acknowledged these results.    Debby SAILOR. Magda, MD FACS [facility] Phone Number: [phone] 06/13/2024 10:40 AM   Total time spent on preparing this encounter including chart review, data review, collecting history, examining the patient, and coordinating care: 45 minutes  Portions of this report may have been transcribed using voice recognition software.  Every effort has been made to ensure accuracy; however, inadvertent computerized transcription errors may still be present.

## 2024-06-14 ENCOUNTER — Other Ambulatory Visit: Payer: Self-pay

## 2024-06-14 ENCOUNTER — Ambulatory Visit (INDEPENDENT_AMBULATORY_CARE_PROVIDER_SITE_OTHER): Admitting: Vascular Surgery

## 2024-06-14 ENCOUNTER — Encounter: Payer: Self-pay | Admitting: Vascular Surgery

## 2024-06-14 ENCOUNTER — Ambulatory Visit
Admission: RE | Admit: 2024-06-14 | Discharge: 2024-06-14 | Disposition: A | Discharge status: Home / Self Care | Source: Ambulatory Visit | Attending: Radiation Oncology | Admitting: Radiation Oncology

## 2024-06-14 ENCOUNTER — Ambulatory Visit (HOSPITAL_COMMUNITY)
Admission: RE | Admit: 2024-06-14 | Discharge: 2024-06-14 | Disposition: A | Discharge status: Home / Self Care | Source: Ambulatory Visit | Attending: Vascular Surgery | Admitting: Vascular Surgery

## 2024-06-14 VITALS — BP 134/74 | HR 62 | Temp 97.8°F | Ht 66.5 in | Wt 138.0 lb

## 2024-06-14 DIAGNOSIS — I739 Peripheral vascular disease, unspecified: Secondary | ICD-10-CM | POA: Insufficient documentation

## 2024-06-14 DIAGNOSIS — Z510 Encounter for antineoplastic radiation therapy: Secondary | ICD-10-CM | POA: Diagnosis not present

## 2024-06-14 DIAGNOSIS — C187 Malignant neoplasm of sigmoid colon: Secondary | ICD-10-CM | POA: Diagnosis not present

## 2024-06-14 LAB — RAD ONC ARIA SESSION SUMMARY
Course Elapsed Days: 22
Plan Fractions Treated to Date: 15
Plan Prescribed Dose Per Fraction: 1.8 Gy
Plan Total Fractions Prescribed: 25
Plan Total Prescribed Dose: 45 Gy
Reference Point Dosage Given to Date: 27 Gy
Reference Point Session Dosage Given: 1.8 Gy
Session Number: 15

## 2024-06-14 LAB — VAS US ABI WITH/WO TBI
Left ABI: 0.63
Right ABI: 0.98

## 2024-06-15 ENCOUNTER — Other Ambulatory Visit: Payer: Self-pay

## 2024-06-15 ENCOUNTER — Ambulatory Visit
Admission: RE | Admit: 2024-06-15 | Discharge: 2024-06-15 | Disposition: A | Discharge status: Home / Self Care | Source: Ambulatory Visit | Attending: Radiation Oncology | Admitting: Radiation Oncology

## 2024-06-15 DIAGNOSIS — Z87891 Personal history of nicotine dependence: Secondary | ICD-10-CM | POA: Diagnosis not present

## 2024-06-15 DIAGNOSIS — C187 Malignant neoplasm of sigmoid colon: Secondary | ICD-10-CM | POA: Diagnosis not present

## 2024-06-15 DIAGNOSIS — Z433 Encounter for attention to colostomy: Secondary | ICD-10-CM | POA: Diagnosis not present

## 2024-06-15 DIAGNOSIS — N529 Male erectile dysfunction, unspecified: Secondary | ICD-10-CM | POA: Diagnosis not present

## 2024-06-15 DIAGNOSIS — I5022 Chronic systolic (congestive) heart failure: Secondary | ICD-10-CM | POA: Diagnosis not present

## 2024-06-15 DIAGNOSIS — Z48815 Encounter for surgical aftercare following surgery on the digestive system: Secondary | ICD-10-CM | POA: Diagnosis not present

## 2024-06-15 DIAGNOSIS — I739 Peripheral vascular disease, unspecified: Secondary | ICD-10-CM | POA: Diagnosis not present

## 2024-06-15 DIAGNOSIS — K5790 Diverticulosis of intestine, part unspecified, without perforation or abscess without bleeding: Secondary | ICD-10-CM | POA: Diagnosis not present

## 2024-06-15 DIAGNOSIS — Z6823 Body mass index (BMI) 23.0-23.9, adult: Secondary | ICD-10-CM | POA: Diagnosis not present

## 2024-06-15 DIAGNOSIS — Z7901 Long term (current) use of anticoagulants: Secondary | ICD-10-CM | POA: Diagnosis not present

## 2024-06-15 DIAGNOSIS — E782 Mixed hyperlipidemia: Secondary | ICD-10-CM | POA: Diagnosis not present

## 2024-06-15 DIAGNOSIS — Z955 Presence of coronary angioplasty implant and graft: Secondary | ICD-10-CM | POA: Diagnosis not present

## 2024-06-15 DIAGNOSIS — I2510 Atherosclerotic heart disease of native coronary artery without angina pectoris: Secondary | ICD-10-CM | POA: Diagnosis not present

## 2024-06-15 DIAGNOSIS — I110 Hypertensive heart disease with heart failure: Secondary | ICD-10-CM | POA: Diagnosis not present

## 2024-06-15 DIAGNOSIS — Z510 Encounter for antineoplastic radiation therapy: Secondary | ICD-10-CM | POA: Diagnosis not present

## 2024-06-15 DIAGNOSIS — D630 Anemia in neoplastic disease: Secondary | ICD-10-CM | POA: Diagnosis not present

## 2024-06-15 DIAGNOSIS — C189 Malignant neoplasm of colon, unspecified: Secondary | ICD-10-CM | POA: Diagnosis not present

## 2024-06-15 DIAGNOSIS — I252 Old myocardial infarction: Secondary | ICD-10-CM | POA: Diagnosis not present

## 2024-06-15 LAB — RAD ONC ARIA SESSION SUMMARY
Course Elapsed Days: 23
Plan Fractions Treated to Date: 16
Plan Prescribed Dose Per Fraction: 1.8 Gy
Plan Total Fractions Prescribed: 25
Plan Total Prescribed Dose: 45 Gy
Reference Point Dosage Given to Date: 28.8 Gy
Reference Point Session Dosage Given: 1.8 Gy
Session Number: 16

## 2024-06-15 NOTE — Progress Notes (Unsigned)
 [facility]   Telephone:(336) [phone] Fax:([phone]) [phone]    Patient Care Team: Severa Rock HERO, FNP as PCP - General (Family Medicine) Thukkani, Arun K, MD as PCP - Cardiology (Cardiology) [name] as Physician Assistant (Cardiology) Bertrum Rosina HERO, RN as VBCI Care Management Clemons, Tillman KATHEE georgann DESIREE Care Management   CHIEF COMPLAINT: Follow up colon cancer  CURRENT THERAPY: 3 cycles Xeloda , followed by chemoradiation with Xeloda , then 3 additional cycles of Xeloda    INTERVAL HISTORY [name] [name] returns for follow up as scheduled. Last seen by me 9/18. He continues radiation. He tried Xeloda  1000 mg BID for 3 doses until it made him sick with nausea, he couldn't sleep, and hands/feet began cracking again. He tried 500 mg BID for another day but was not much better and he stopped it. Otherwise doing well. Would like to move forward with ostomy reversal before the end of the year if possible.    ROS  All other systems reviewed and negative   Past Medical History:  Diagnosis Date   Cancer (HCC)    Coronary artery disease    Erectile dysfunction    HFrEF (heart failure with reduced ejection fraction) (HCC)    Echocardiogram 07/14/21:  EF 25, global HK, inf HK worse, Gr 2 DD, normal RVSF, mild to mod MR, AV sclerosis w/o AS Echocardiogram 2/23: EF 25-30, global HK, GLS -12.9, normal RVSF, mild to mod MR, AV sclerosis w/o AS    History of hiatal hernia    a lone time ago   Hyperlipidemia    [place] fracture [facility]) 03/22/2020   Myocardial infarction (HCC)    x 2   Polio      Past Surgical History:  Procedure Laterality Date   APPENDECTOMY     CARDIAC CATHETERIZATION     has two stents placed   CERVICAL SPINE SURGERY     c3-4, 4-5,  5-6, 8 screws and plates   COLOSTOMY Left 02/01/2024   Procedure: CREATION, COLOSTOMY;  Surgeon: Polly Cordella LABOR, MD;  [facility];  Service: General;  Laterality: Left;   CORONARY STENT INTERVENTION N/A  07/15/2021   Procedure: CORONARY STENT INTERVENTION;  Surgeon: Court Dorn PARAS, MD;  [facility];  Service: Cardiovascular;  Laterality: N/A;   CORONARY STENT PLACEMENT     forein body removal     metal sharp removed from right leg   LAPAROTOMY N/A 02/01/2024   Procedure: LAPAROTOMY, EXPLORATORY;  Surgeon: Polly Cordella LABOR, MD;  [facility];  Service: General;  Laterality: N/A;  POSSIBLE OSTOMY   LEFT HEART CATH AND CORONARY ANGIOGRAPHY N/A 11/15/2018   Procedure: LEFT HEART CATH AND CORONARY ANGIOGRAPHY;  Surgeon: Mady Bruckner, MD;  [facility];  Service: Cardiovascular;  Laterality: N/A;   LEFT HEART CATH AND CORONARY ANGIOGRAPHY N/A 07/15/2021   Procedure: LEFT HEART CATH AND CORONARY ANGIOGRAPHY;  Surgeon: Court Dorn PARAS, MD;  [facility];  Service: Cardiovascular;  Laterality: N/A;   MANDIBULAR HARDWARE REMOVAL Bilateral 04/23/2020   Procedure: MANDIBULAR HARDWARE REMOVAL;  Surgeon: Jesus Oliphant, MD;  Location: [facility];  Service: ENT;  Laterality: Bilateral;   ORIF MANDIBULAR FRACTURE N/A 03/14/2020   Procedure: OPEN REDUCTION INTERNAL FIXATION (ORIF) MID  FACE FRACTURE, MANDIBULAR FIXATION MODIFIED ARCH BARS;  Surgeon: Jesus Oliphant, MD;  Location: [facility] OR;  Service: ENT;  Laterality: N/A;   PARTIAL COLECTOMY N/A 02/01/2024   Procedure:  COLECTOMY, PARTIAL;  Surgeon: Polly Cordella LABOR, MD;  Location: [facility] OR;  Service: General;  Laterality: N/A;  sigmoid colon   SCAR REVISION N/A 04/23/2020   Procedure: SCAR REVISION/ Upper Lip Repair;  Surgeon: Jesus Oliphant, MD;  Location: [facility];  Service: ENT;  Laterality: N/A;     Outpatient Encounter Medications as of 06/16/2024  Medication Sig   cholecalciferol  (VITAMIN D3) 25 MCG (1000 UNIT) tablet Take 1,000 Units by mouth daily.    dabigatran  (PRADAXA ) 150 MG CAPS capsule Take 1 capsule (150 mg total) by mouth 2 (two) times daily.   losartan  (COZAAR ) 25 MG  tablet Take 0.5 tablets (12.5 mg total) by mouth daily.   metoprolol  tartrate (LOPRESSOR ) 25 MG tablet Take 0.5 tablets (12.5 mg total) by mouth 2 (two) times daily.   rosuvastatin  (CRESTOR ) 20 MG tablet Take 1 tablet (20 mg total) by mouth daily.   capecitabine  (XELODA ) 500 MG tablet Take 1000 mg AM, Take 1000 mg PM, total dose 2000 mg daily, take on days of radiation only. # 8 pills should complete therapy while on radiation (#30 treatments) (Patient not taking: Reported on 06/16/2024)   ketoconazole (NIZORAL) 2 % cream Apply 1 Application topically 2 (two) times daily. (Patient not taking: Reported on 06/16/2024)   nitroGLYCERIN  (NITROSTAT ) 0.4 MG SL tablet Place 1 tablet (0.4 mg total) under the tongue every 5 (five) minutes as needed for chest pain. (Patient not taking: Reported on 06/16/2024)   ondansetron  (ZOFRAN ) 8 MG tablet Take 1 tablet (8 mg total) by mouth every 8 (eight) hours as needed for nausea. (Patient not taking: Reported on 06/16/2024)   oxyCODONE  (OXY IR/ROXICODONE ) 5 MG immediate release tablet Take 1-2 tablets (5-10 mg total) by mouth every 6 (six) hours as needed for severe pain (pain score 7-10). (Patient not taking: Reported on 06/16/2024)   polyethylene glycol powder (GLYCOLAX /MIRALAX ) 17 GM/SCOOP powder Take 17 g by mouth daily. (Patient not taking: Reported on 06/16/2024)   [DISCONTINUED] capecitabine  (XELODA ) 500 MG tablet Take 1000 mg AM, Take 1000 mg PM, total dose 2000 mg daily, take on days of radiation only (Patient not taking: Reported on 06/16/2024)   No facility-administered encounter medications on file as of 06/16/2024.     Today's Vitals   06/16/24 1100 06/16/24 1104  BP:  109/63  Pulse:  73  Resp:  18  Temp:  98.1 F (36.7 C)  TempSrc:  Oral  SpO2:  100%  Weight:  137 lb 1.6 oz (62.2 kg)  Height:  5' 6.5 (1.689 m)  PainSc: 0-No pain    Body mass index is 21.8 kg/m.   ECOG PERFORMANCE STATUS: 1 - Symptomatic but completely ambulatory  PHYSICAL  EXAM GENERAL:alert, no distress and comfortable SKIN: palms dry with mild erythema. No rash  EYES: sclera clear NECK: without mass LUNGS: normal breathing effort NEURO: alert & oriented x 3 with fluent speech, no focal motor/sensory deficits   CBC    Latest Ref Rng & Units 06/16/2024   10:46 AM 06/02/2024   10:35 AM 05/11/2024   11:59 AM  CBC  WBC 4.0 - 10.5 K/uL 5.2  4.0  7.9   Hemoglobin 13.0 - 17.0 g/dL 87.1  87.3  86.2   Hematocrit 39.0 - 52.0 % 37.4  36.6  40.0   Platelets 150 - 400 K/uL 189  133  170       CMP     Latest Ref Rng & Units 06/16/2024   10:46 AM 06/02/2024  10:35 AM 05/11/2024   11:59 AM  CMP  Glucose 70 - 99 mg/dL 92  82  94   BUN 8 - 23 mg/dL 11  12  12    Creatinine 0.61 - 1.24 mg/dL 9.36  9.34  9.24   Sodium 135 - 145 mmol/L 138  137  138   Potassium 3.5 - 5.1 mmol/L 4.2  4.1  4.8   Chloride 98 - 111 mmol/L 102  102  104   CO2 22 - 32 mmol/L 25  26  23    Calcium  8.9 - 10.3 mg/dL 9.7  9.4  9.7   Total Protein 6.5 - 8.1 g/dL 6.7  6.2  6.7   Total Bilirubin 0.0 - 1.2 mg/dL 0.7  0.9  1.2   Alkaline Phos 38 - 126 U/L 72  63  73   AST 15 - 41 U/L 22  33  46   ALT 0 - 44 U/L 24  35  56       ASSESSMENT & PLAN:  1. 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), status post a sigmoidectomy and end colostomy 02/01/2024 0/11 nodes, positive as enteric and distal margin, macroscopic tumor perforation-carcinoma involves distal disrupted segment CT angio chest/abdomen/pelvis 01/26/2024: Severe emphysema, segmental circumferential sigmoid thickening with associated contained perforation/abscess no adenopathy CT abdomen/pelvis 01/29/2024: Persistent perforated sigmoid diverticulitis with 2 adjacent fluid collections, 1 containing gas CT chest 02/02/2024: Tiny bilateral noncalcified pulmonary nodules nonspecific severe emphysema 03/07/2024-guardant reveal ctDNA not detected Cycle 1 capecitabine  03/14/2024 Cycle 2 capecitabine  04/04/2024 Cycle 3 capecitabine   04/25/2024 Began radiation 05/23/24 x5.5 weeks, did not tolerate Xeloda  Guardant reveal ctDNA not detected 06/03/24  COPD CAD CHF, possible early LV thrombus on echocardiogram 01/28/2024, LVEF 25-30%-apixaban  Hypertension Hyperlipidemia Pulmonary nodules-nonspecific pulmonary nodules on CT 01/26/2024 and 02/02/2024  Disposition:  Mr. Sharrar appears stable. He did not tolerate concurrent chemoradiation with low dose Xeloda , will stop now. He understands the rationale for radiosensitizing xeloda  but is not interested in trying again. Continue radiation to complete on 10/17.  Today's labs and negative ctDNA reviewed. Will continue to monitor this on surveillance in the future.   He would like to proceed with ostomy reversal as soon as possible. Will refer him to GI for pre-op colonoscopy per Dr. Polly.   He will return for follow up with Dr. Cloretta in 3 weeks, or sooner if needed.   Orders Placed This Encounter  Procedures   Ambulatory referral to Gastroenterology    Referral Priority:   Urgent    Referral Type:   Consultation    Referral Reason:   Specialty Services Required    Number of Visits Requested:   1      All questions were answered. The patient knows to call the clinic with any problems, questions or concerns. No barriers to learning were detected.   [name] Massing K Sebastian Lurz, NP 06/16/2024

## 2024-06-16 ENCOUNTER — Other Ambulatory Visit: Payer: Self-pay

## 2024-06-16 ENCOUNTER — Other Ambulatory Visit: Payer: Self-pay | Admitting: Oncology

## 2024-06-16 ENCOUNTER — Telehealth: Payer: Self-pay | Admitting: *Deleted

## 2024-06-16 ENCOUNTER — Inpatient Hospital Stay (HOSPITAL_BASED_OUTPATIENT_CLINIC_OR_DEPARTMENT_OTHER): Admitting: Nurse Practitioner

## 2024-06-16 ENCOUNTER — Encounter: Payer: Self-pay | Admitting: Nurse Practitioner

## 2024-06-16 ENCOUNTER — Ambulatory Visit
Admission: RE | Admit: 2024-06-16 | Discharge: 2024-06-16 | Disposition: A | Discharge status: Home / Self Care | Source: Ambulatory Visit | Attending: Radiation Oncology | Admitting: Radiation Oncology

## 2024-06-16 ENCOUNTER — Inpatient Hospital Stay: Attending: Oncology

## 2024-06-16 ENCOUNTER — Other Ambulatory Visit (HOSPITAL_COMMUNITY): Payer: Self-pay

## 2024-06-16 VITALS — BP 109/63 | HR 73 | Temp 98.1°F | Resp 18 | Ht 66.5 in | Wt 137.1 lb

## 2024-06-16 DIAGNOSIS — R918 Other nonspecific abnormal finding of lung field: Secondary | ICD-10-CM | POA: Diagnosis not present

## 2024-06-16 DIAGNOSIS — E785 Hyperlipidemia, unspecified: Secondary | ICD-10-CM | POA: Insufficient documentation

## 2024-06-16 DIAGNOSIS — I110 Hypertensive heart disease with heart failure: Secondary | ICD-10-CM | POA: Insufficient documentation

## 2024-06-16 DIAGNOSIS — J449 Chronic obstructive pulmonary disease, unspecified: Secondary | ICD-10-CM | POA: Insufficient documentation

## 2024-06-16 DIAGNOSIS — Z9049 Acquired absence of other specified parts of digestive tract: Secondary | ICD-10-CM | POA: Diagnosis not present

## 2024-06-16 DIAGNOSIS — C187 Malignant neoplasm of sigmoid colon: Secondary | ICD-10-CM

## 2024-06-16 DIAGNOSIS — Z510 Encounter for antineoplastic radiation therapy: Secondary | ICD-10-CM | POA: Diagnosis not present

## 2024-06-16 DIAGNOSIS — I2510 Atherosclerotic heart disease of native coronary artery without angina pectoris: Secondary | ICD-10-CM | POA: Insufficient documentation

## 2024-06-16 LAB — CBC WITH DIFFERENTIAL (CANCER CENTER ONLY)
Abs Immature Granulocytes: 0.08 K/uL — ABNORMAL HIGH (ref 0.00–0.07)
Basophils Absolute: 0 K/uL (ref 0.0–0.1)
Basophils Relative: 1 %
Eosinophils Absolute: 0.4 K/uL (ref 0.0–0.5)
Eosinophils Relative: 7 %
HCT: 37.4 % — ABNORMAL LOW (ref 39.0–52.0)
Hemoglobin: 12.8 g/dL — ABNORMAL LOW (ref 13.0–17.0)
Immature Granulocytes: 2 %
Lymphocytes Relative: 9 %
Lymphs Abs: 0.5 K/uL — ABNORMAL LOW (ref 0.7–4.0)
MCH: 33.2 pg (ref 26.0–34.0)
MCHC: 34.2 g/dL (ref 30.0–36.0)
MCV: 97.1 fL (ref 80.0–100.0)
Monocytes Absolute: 0.6 K/uL (ref 0.1–1.0)
Monocytes Relative: 11 %
Neutro Abs: 3.7 K/uL (ref 1.7–7.7)
Neutrophils Relative %: 70 %
Platelet Count: 189 K/uL (ref 150–400)
RBC: 3.85 MIL/uL — ABNORMAL LOW (ref 4.22–5.81)
RDW: 16 % — ABNORMAL HIGH (ref 11.5–15.5)
WBC Count: 5.2 K/uL (ref 4.0–10.5)
nRBC: 0 % (ref 0.0–0.2)

## 2024-06-16 LAB — RAD ONC ARIA SESSION SUMMARY
Course Elapsed Days: 24
Plan Fractions Treated to Date: 17
Plan Prescribed Dose Per Fraction: 1.8 Gy
Plan Total Fractions Prescribed: 25
Plan Total Prescribed Dose: 45 Gy
Reference Point Dosage Given to Date: 30.6 Gy
Reference Point Session Dosage Given: 1.8 Gy
Session Number: 17

## 2024-06-16 LAB — CMP (CANCER CENTER ONLY)
ALT: 24 U/L (ref 0–44)
AST: 22 U/L (ref 15–41)
Albumin: 3.8 g/dL (ref 3.5–5.0)
Alkaline Phosphatase: 72 U/L (ref 38–126)
Anion gap: 10 (ref 5–15)
BUN: 11 mg/dL (ref 8–23)
CO2: 25 mmol/L (ref 22–32)
Calcium: 9.7 mg/dL (ref 8.9–10.3)
Chloride: 102 mmol/L (ref 98–111)
Creatinine: 0.63 mg/dL (ref 0.61–1.24)
GFR, Estimated: >60 mL/min (ref >60)
Glucose, Bld: 92 mg/dL (ref 70–99)
Potassium: 4.2 mmol/L (ref 3.5–5.1)
Sodium: 138 mmol/L (ref 135–145)
Total Bilirubin: 0.7 mg/dL (ref 0.0–1.2)
Total Protein: 6.7 g/dL (ref 6.5–8.1)

## 2024-06-16 MED ORDER — CAPECITABINE 500 MG PO TABS
ORAL_TABLET | ORAL | 0 refills | Status: DC
  Filled 2024-06-16: qty 8, fill #0

## 2024-06-16 NOTE — Telephone Encounter (Signed)
 Spoke to patient who is agreeable to coming for office visit at [facility] to discuss possible colonoscopy prior to ostomy reversal consideration. Patient has been scheduled with Alan Coombs, PA-C on 06/23/24 at 8:40 am and verbalizes understanding.  He will likely require a hospital colonoscopy procedure since his cardiac EF Is 25-30% as of 04/20/24. He is also on pradaxa .

## 2024-06-16 NOTE — Telephone Encounter (Signed)
-----   Message from Gordy HERO Pyrtle sent at 06/16/2024  2:56 PM EDT ----- I am responding on behalf of of Dr. Leigh, Dr. Stacia and myself.  I think any of us  would be willing to perform his colonoscopy. Dottie, can you get the patient an appointment with an APP and then colonoscopy if patient agreeable Diagnosis is history of sigmoid colon cancer JMP ----- Message ----- From: Burton, Lacie K, NP Sent: 06/16/2024   1:48 PM EDT To: Devere LITTIE Leaven, RN; Gordy HERO Starch, MD; Steven#  Good afternoon GI,  This patient with pT4N0 stage II colon cancer s/p resection and end colostomy is completing adjuvant radiation on 10/17. We planned to give him 3 additional cycles of xeloda  after radiation, but he doesn't tolerate it well and frankly I don't think he'll agree. He would like the ostomy reversed asap. Only eats deer meet and can't hunt with the bag. Dr. Polly recommends a pre op colonoscopy since he has never had one, and does not have a GI provider. Please let me know if you have the bandwidth to accept him. A referral was placed in epic.   Thanks for considering, Lacie NP with Dr. Cloretta

## 2024-06-17 ENCOUNTER — Ambulatory Visit
Admission: RE | Admit: 2024-06-17 | Discharge: 2024-06-17 | Disposition: A | Discharge status: Home / Self Care | Source: Ambulatory Visit | Attending: Radiation Oncology | Admitting: Radiation Oncology

## 2024-06-17 ENCOUNTER — Other Ambulatory Visit: Payer: Self-pay

## 2024-06-17 ENCOUNTER — Telehealth: Payer: Self-pay | Admitting: Oncology

## 2024-06-17 DIAGNOSIS — C187 Malignant neoplasm of sigmoid colon: Secondary | ICD-10-CM | POA: Diagnosis not present

## 2024-06-17 LAB — RAD ONC ARIA SESSION SUMMARY
Course Elapsed Days: 25
Plan Fractions Treated to Date: 18
Plan Prescribed Dose Per Fraction: 1.8 Gy
Plan Total Fractions Prescribed: 25
Plan Total Prescribed Dose: 45 Gy
Reference Point Dosage Given to Date: 32.4 Gy
Reference Point Session Dosage Given: 1.8 Gy
Session Number: 18

## 2024-06-17 NOTE — Progress Notes (Signed)
 Per OV on 10/2, patient unable to tolerate Xeloda  even at low dose. Will finish out radiation on 10/17, but Xeloda  has been stopped. Disenrolled.

## 2024-06-17 NOTE — Telephone Encounter (Signed)
 Patient has been scheduled for follow-up visit per 06/16/24 LOS.  LVM notifying pt of appt details, provided my direct number to pt if appt changes need to be made.

## 2024-06-20 ENCOUNTER — Ambulatory Visit
Admission: RE | Admit: 2024-06-20 | Discharge: 2024-06-20 | Disposition: A | Discharge status: Home / Self Care | Source: Ambulatory Visit | Attending: Radiation Oncology | Admitting: Radiation Oncology

## 2024-06-20 ENCOUNTER — Telehealth: Payer: Self-pay | Admitting: Oncology

## 2024-06-20 ENCOUNTER — Other Ambulatory Visit: Payer: Self-pay

## 2024-06-20 DIAGNOSIS — C187 Malignant neoplasm of sigmoid colon: Secondary | ICD-10-CM | POA: Diagnosis not present

## 2024-06-20 DIAGNOSIS — Z510 Encounter for antineoplastic radiation therapy: Secondary | ICD-10-CM | POA: Diagnosis not present

## 2024-06-20 LAB — RAD ONC ARIA SESSION SUMMARY
Course Elapsed Days: 28
Plan Fractions Treated to Date: 19
Plan Prescribed Dose Per Fraction: 1.8 Gy
Plan Total Fractions Prescribed: 25
Plan Total Prescribed Dose: 45 Gy
Reference Point Dosage Given to Date: 34.2 Gy
Reference Point Session Dosage Given: 1.8 Gy
Session Number: 19

## 2024-06-20 NOTE — Telephone Encounter (Signed)
 Returning PT's phone call, left voicemail message

## 2024-06-21 ENCOUNTER — Other Ambulatory Visit: Payer: Self-pay

## 2024-06-21 ENCOUNTER — Ambulatory Visit
Admission: RE | Admit: 2024-06-21 | Discharge: 2024-06-21 | Disposition: A | Discharge status: Home / Self Care | Source: Ambulatory Visit | Attending: Radiation Oncology | Admitting: Radiation Oncology

## 2024-06-21 DIAGNOSIS — Z510 Encounter for antineoplastic radiation therapy: Secondary | ICD-10-CM | POA: Diagnosis not present

## 2024-06-21 DIAGNOSIS — C187 Malignant neoplasm of sigmoid colon: Secondary | ICD-10-CM | POA: Diagnosis not present

## 2024-06-21 LAB — RAD ONC ARIA SESSION SUMMARY
Course Elapsed Days: 29
Plan Fractions Treated to Date: 20
Plan Prescribed Dose Per Fraction: 1.8 Gy
Plan Total Fractions Prescribed: 25
Plan Total Prescribed Dose: 45 Gy
Reference Point Dosage Given to Date: 36 Gy
Reference Point Session Dosage Given: 1.8 Gy
Session Number: 20

## 2024-06-22 ENCOUNTER — Ambulatory Visit
Admission: RE | Admit: 2024-06-22 | Discharge: 2024-06-22 | Disposition: A | Discharge status: Home / Self Care | Source: Ambulatory Visit | Attending: Radiation Oncology | Admitting: Radiation Oncology

## 2024-06-22 ENCOUNTER — Other Ambulatory Visit: Payer: Self-pay

## 2024-06-22 ENCOUNTER — Ambulatory Visit (HOSPITAL_COMMUNITY)
Admission: RE | Admit: 2024-06-22 | Discharge: 2024-06-22 | Disposition: A | Discharge status: Home / Self Care | Source: Ambulatory Visit | Attending: Cardiology | Admitting: Cardiology

## 2024-06-22 DIAGNOSIS — I5020 Unspecified systolic (congestive) heart failure: Secondary | ICD-10-CM

## 2024-06-22 DIAGNOSIS — I513 Intracardiac thrombosis, not elsewhere classified: Secondary | ICD-10-CM

## 2024-06-22 DIAGNOSIS — Z510 Encounter for antineoplastic radiation therapy: Secondary | ICD-10-CM | POA: Diagnosis not present

## 2024-06-22 DIAGNOSIS — C187 Malignant neoplasm of sigmoid colon: Secondary | ICD-10-CM | POA: Diagnosis not present

## 2024-06-22 LAB — RAD ONC ARIA SESSION SUMMARY
Course Elapsed Days: 30
Plan Fractions Treated to Date: 21
Plan Prescribed Dose Per Fraction: 1.8 Gy
Plan Total Fractions Prescribed: 25
Plan Total Prescribed Dose: 45 Gy
Reference Point Dosage Given to Date: 37.8 Gy
Reference Point Session Dosage Given: 1.8 Gy
Session Number: 21

## 2024-06-22 MED ORDER — PERFLUTREN LIPID MICROSPHERE
1.0000 mL | INTRAVENOUS | Status: DC | PRN
  Administered 2024-06-22: 2 mL via INTRAVENOUS

## 2024-06-22 NOTE — Progress Notes (Unsigned)
 06/23/2024 [name] [id number] May 11, 1946  Referring provider: Severa Rock HERO, FNP Primary GI doctor: Dr. 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 All questions were answered and the patient acknowledges these risk and wishes to proceed.  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 male with a past medical history listed below presents for evaluation of colonoscopy in the hospital prior to reversal.   Discussed the use of AI scribe software for clinical note transcription with the patient, who gave verbal consent to proceed.  History of  Present Illness   Joenathan Sakuma. is a 78 year old male with sigmoid colon cancer status post resection who presents for follow-up regarding urinary symptoms and colostomy reversal.  He has a history of sigmoid colon cancer and underwent resection in May 2025. He has completed three cycles of chemotherapy and is nearing the end of his radiation treatment, scheduled to be completed on July 01, 2024. He declined a 'double dose' of radiation due to concerns about his body's tolerance.  He experiences burning during urination, which has worsened over the past week, causing nocturia four to five times a night. He reports hesitancy and burning as significant symptoms. No rectal bleeding, hematuria, fevers, chills, pubic pain, or back pain. He stays hydrated and drinks a lot of fluids.  He has a colostomy and is eager for reversal to improve his quality of life, particularly for activities like hunting. A colonoscopy is required before the reversal, and he has not had one before.  He denies swelling in his legs, fluid overload, chest pain, or significant respiratory symptoms. He has a history of heart issues, including a left ventricular thrombus, and is on Pradaxa . He recently had an echocardiogram, with results pending. No family history of colon cancer.      He  reports that he has quit smoking. His smoking use included cigarettes. He has been exposed to tobacco smoke. He has never used smokeless tobacco. He reports current alcohol use of about 21.0 standard drinks of alcohol per week. He reports that he does not use drugs.  RELEVANT GI HISTORY, IMAGING AND LABS: Results   DIAGNOSTIC Echocardiogram: Early left ventricular thrombus (06/22/2024)  CBC    Component Value Date/Time   WBC 5.2 06/16/2024 1046   WBC 8.1 02/09/2024 0538   RBC 3.85 (L) 06/16/2024 1046   HGB 12.8 (L) 06/16/2024 1046   HGB 12.1 (L) 02/24/2024 1214   HCT 37.4 (L) 06/16/2024 1046   HCT 38.0 02/24/2024 1214    PLT 189 06/16/2024 1046   PLT 235 02/24/2024 1214   MCV 97.1 06/16/2024 1046   MCV 90 02/24/2024 1214   MCH 33.2 06/16/2024 1046   MCHC 34.2 06/16/2024 1046   RDW 16.0 (H) 06/16/2024 1046   RDW 14.3 02/24/2024 1214   LYMPHSABS 0.5 (L) 06/16/2024 1046   LYMPHSABS 1.2 02/24/2024 1214   MONOABS 0.6 06/16/2024 1046   EOSABS 0.4 06/16/2024 1046   EOSABS 0.3 02/24/2024 1214   BASOSABS 0.0 06/16/2024 1046   BASOSABS 0.0 02/24/2024 1214   Recent Labs    02/06/24 0437 02/07/24 0545 02/09/24 0538 02/24/24 1214 03/07/24 1350 03/29/24 1016 04/19/24 1044 05/11/24 1159 06/02/24 1035 06/16/24 1046  HGB 12.9* 12.1* 12.7* 12.1* 12.1* 12.9* 13.7 13.7 12.6* 12.8*    CMP     Component Value Date/Time   NA 138 06/16/2024 1046   NA 139 02/24/2024 1214   K 4.2 06/16/2024 1046   CL 102 06/16/2024 1046   CO2 25 06/16/2024 1046   GLUCOSE 92 06/16/2024 1046   BUN 11 06/16/2024 1046   BUN 17 02/24/2024 1214   CREATININE 0.63 06/16/2024 1046   CREATININE 0.93 03/29/2013 0923   CALCIUM  9.7 06/16/2024 1046   PROT 6.7 06/16/2024 1046   PROT 5.6 (L) 02/24/2024 1214   ALBUMIN 3.8 06/16/2024 1046   ALBUMIN 3.7 (L) 02/24/2024 1214   AST 22 06/16/2024 1046   ALT 24 06/16/2024 1046   ALKPHOS 72 06/16/2024 1046   BILITOT 0.7 06/16/2024 1046   GFRNONAA >60 06/16/2024 1046   GFRNONAA 85 03/29/2013 0923   GFRAA 104 05/01/2020 1031   GFRAA >89 03/29/2013 0923      Latest Ref Rng & Units 06/16/2024   10:46 AM 06/02/2024   10:35 AM 05/11/2024   11:59 AM  Hepatic Function  Total Protein 6.5 - 8.1 g/dL 6.7  6.2  6.7   Albumin 3.5 - 5.0 g/dL 3.8  4.1  4.4   AST 15 - 41 U/L 22  33  46   ALT 0 - 44 U/L 24  35  56   Alk Phosphatase 38 - 126 U/L 72  63  73   Total Bilirubin 0.0 - 1.2 mg/dL 0.7  0.9  1.2       Current Medications:    Current Outpatient Medications (Cardiovascular):    losartan  (COZAAR ) 25 MG tablet, Take 0.5 tablets (12.5 mg total) by mouth daily.   metoprolol  tartrate  (LOPRESSOR ) 25 MG tablet, Take 0.5 tablets (12.5 mg total) by mouth 2 (two) times daily.   nitroGLYCERIN  (NITROSTAT ) 0.4 MG SL tablet, Place 1 tablet (0.4 mg total) under the tongue every 5 (five) minutes as needed for chest pain.   rosuvastatin  (CRESTOR ) 20 MG tablet, Take 1 tablet (20 mg total) by mouth daily.   Current Outpatient Medications (Analgesics):    oxyCODONE  (OXY IR/ROXICODONE ) 5 MG immediate release tablet, Take 1-2 tablets (5-10 mg total) by mouth every 6 (six) hours as needed for severe pain (pain score 7-10).  Current Outpatient Medications (Hematological):    dabigatran  (PRADAXA ) 150 MG CAPS capsule, Take 1 capsule (150 mg total) by mouth 2 (two) times daily.  Current Outpatient Medications (  Other):    capecitabine  (XELODA ) 500 MG tablet, Take 1000 mg AM, Take 1000 mg PM, total dose 2000 mg daily, take on days of radiation only. # 8 pills should complete therapy while on radiation (#30 treatments)   cholecalciferol  (VITAMIN D3) 25 MCG (1000 UNIT) tablet, Take 1,000 Units by mouth daily.    ketoconazole (NIZORAL) 2 % cream, Apply 1 Application topically 2 (two) times daily.   ondansetron  (ZOFRAN ) 8 MG tablet, Take 1 tablet (8 mg total) by mouth every 8 (eight) hours as needed for nausea.   polyethylene glycol powder (GLYCOLAX /MIRALAX ) 17 GM/SCOOP powder, Take 17 g by mouth daily.  Medical History:  Past Medical History:  Diagnosis Date   Cancer [facility])    Coronary artery disease    Erectile dysfunction    HFrEF (heart failure with reduced ejection fraction) (HCC)    Echocardiogram 07/14/21:  EF 25, global HK, inf HK worse, Gr 2 DD, normal RVSF, mild to mod MR, AV sclerosis w/o AS Echocardiogram 2/23: EF 25-30, global HK, GLS -12.9, normal RVSF, mild to mod MR, AV sclerosis w/o AS    History of hiatal hernia    a lone time ago   Hyperlipidemia    [place] fracture [facility]) 03/22/2020   Myocardial infarction (HCC)    x 2   Polio    Allergies:  Allergies  Allergen  Reactions   Brilinta  [Ticagrelor ] Shortness Of Breath   Carvedilol  Diarrhea   Lipitor [Atorvastatin] Other (See Comments)    Makes pt feel funky   Plavix  [Clopidogrel ] Rash     Surgical History:  He  has a past surgical history that includes Coronary stent placement; Appendectomy; Cardiac catheterization; Cervical spine surgery; forein body removal; LEFT HEART CATH AND CORONARY ANGIOGRAPHY (N/A, 11/15/2018); ORIF mandibular fracture (N/A, 03/14/2020); Mandibular hardware removal (Bilateral, 04/23/2020); Scar revision (N/A, 04/23/2020); LEFT HEART CATH AND CORONARY ANGIOGRAPHY (N/A, 07/15/2021); CORONARY STENT INTERVENTION (N/A, 07/15/2021); laparotomy (N/A, 02/01/2024); Colostomy (Left, 02/01/2024); and Partial colectomy (N/A, 02/01/2024). Family History:  His family history includes Diabetes in his mother; Heart disease in his father and mother.  REVIEW OF SYSTEMS  : All other systems reviewed and negative except where noted in the History of Present Illness.  PHYSICAL EXAM: BP 118/70   Pulse 74   Ht 5' 6 (1.676 m)   Wt 139 lb (63 kg)   SpO2 90%   BMI 22.44 kg/m  Physical Exam   GENERAL APPEARANCE: Well nourished, in no apparent distress. HEENT: No cervical lymphadenopathy, unremarkable thyroid , sclerae anicteric, conjunctiva pink. RESPIRATORY: Respiratory effort normal, breath sounds equal bilaterally without rales, rhonchi, or wheezing. CARDIO: Regular rate and rhythm with no murmurs, rubs, or gallops, peripheral pulses intact. ABDOMEN: Soft, non-distended, active bowel sounds in all four quadrants, no tenderness to palpation, no rebound, no mass appreciated. Has ostomy with light brown stool RECTAL: Rectal exam normal. MUSCULOSKELETAL: Full range of motion, normal gait, without edema. SKIN: Dry, intact without rashes or lesions. No jaundice. NEURO: Alert, oriented, no focal deficits. PSYCH: Cooperative, normal mood and affect.      Alan JONELLE Coombs, PA-C 8:58 AM

## 2024-06-23 ENCOUNTER — Telehealth: Payer: Self-pay | Admitting: *Deleted

## 2024-06-23 ENCOUNTER — Other Ambulatory Visit (INDEPENDENT_AMBULATORY_CARE_PROVIDER_SITE_OTHER)

## 2024-06-23 ENCOUNTER — Ambulatory Visit
Admission: RE | Admit: 2024-06-23 | Discharge: 2024-06-23 | Disposition: A | Discharge status: Home / Self Care | Source: Ambulatory Visit | Attending: Radiation Oncology | Admitting: Radiation Oncology

## 2024-06-23 ENCOUNTER — Ambulatory Visit: Payer: Self-pay | Admitting: Physician Assistant

## 2024-06-23 ENCOUNTER — Other Ambulatory Visit

## 2024-06-23 ENCOUNTER — Encounter: Payer: Self-pay | Admitting: Physician Assistant

## 2024-06-23 ENCOUNTER — Ambulatory Visit (INDEPENDENT_AMBULATORY_CARE_PROVIDER_SITE_OTHER): Admitting: Physician Assistant

## 2024-06-23 ENCOUNTER — Other Ambulatory Visit: Payer: Self-pay

## 2024-06-23 VITALS — BP 118/70 | HR 74 | Ht 66.0 in | Wt 139.0 lb

## 2024-06-23 DIAGNOSIS — I5020 Unspecified systolic (congestive) heart failure: Secondary | ICD-10-CM

## 2024-06-23 DIAGNOSIS — R300 Dysuria: Secondary | ICD-10-CM | POA: Diagnosis not present

## 2024-06-23 DIAGNOSIS — Z933 Colostomy status: Secondary | ICD-10-CM | POA: Diagnosis not present

## 2024-06-23 DIAGNOSIS — Z7901 Long term (current) use of anticoagulants: Secondary | ICD-10-CM

## 2024-06-23 DIAGNOSIS — C187 Malignant neoplasm of sigmoid colon: Secondary | ICD-10-CM

## 2024-06-23 DIAGNOSIS — I2510 Atherosclerotic heart disease of native coronary artery without angina pectoris: Secondary | ICD-10-CM

## 2024-06-23 LAB — RAD ONC ARIA SESSION SUMMARY
Course Elapsed Days: 31
Plan Fractions Treated to Date: 22
Plan Prescribed Dose Per Fraction: 1.8 Gy
Plan Total Fractions Prescribed: 25
Plan Total Prescribed Dose: 45 Gy
Reference Point Dosage Given to Date: 39.6 Gy
Reference Point Session Dosage Given: 1.8 Gy
Session Number: 22

## 2024-06-23 LAB — ECHOCARDIOGRAM LIMITED
Area-P 1/2: 3.17 cm2
S' Lateral: 4.4 cm

## 2024-06-23 LAB — URINALYSIS, ROUTINE W REFLEX MICROSCOPIC
Bilirubin Urine: NEGATIVE
Hgb urine dipstick: NEGATIVE
Ketones, ur: NEGATIVE
Leukocytes,Ua: NEGATIVE
Nitrite: NEGATIVE
RBC / HPF: NONE SEEN (ref 0–?)
Specific Gravity, Urine: <=1.005 — AB (ref 1.000–1.030)
Total Protein, Urine: NEGATIVE
Urine Glucose: NEGATIVE
Urobilinogen, UA: 0.2 (ref 0.0–1.0)
WBC, UA: NONE SEEN (ref 0–?)
pH: 6 (ref 5.0–8.0)

## 2024-06-23 MED ORDER — NA SULFATE-K SULFATE-MG SULF 17.5-3.13-1.6 GM/177ML PO SOLN: 1.0000 | Freq: Once | ORAL | 0 refills | Status: AC

## 2024-06-23 NOTE — Patient Instructions (Addendum)
 Your provider has requested that you go to the basement level for lab work before leaving today. Press B on the elevator. The lab is located at the first door on the left as you exit the elevator.  We have scheduled you for a colonoscopy at [facility] on 08/08/2024 with Dr Charlanne, Please follow separate instructions given to you today  You will be contacted by our office prior to your procedure for directions on holding your Pradaxa .  If you do not hear from our office 1 week prior to your scheduled procedure, please call [phone] to discuss.   We have sent the following medications to your pharmacy for you to pick up at your convenience: Suprep  Due to recent changes in healthcare laws, you may see the results of your imaging and laboratory studies on MyChart before your provider has had a chance to review them.  We understand that in some cases there may be results that are confusing or concerning to you. Not all laboratory results come back in the same time frame and the provider may be waiting for multiple results in order to interpret others.  Please give us  48 hours in order for your provider to thoroughly review all the results before contacting the office for clarification of your results.    I appreciate the  opportunity to care for you  Thank You   [facility]

## 2024-06-23 NOTE — Telephone Encounter (Signed)
 [facility] Pre-operative Risk Assessment     Request for surgical clearance:     Endoscopy Procedure  What type of surgery is being performed?     Colonoscopy at [facility]  When is this surgery scheduled?     08/08/2024  What type of clearance is required ?   Pharmacy  Are there any medications that need to be held prior to surgery and how long? Pradaxa    Practice name and name of physician performing surgery?       Gastroenterology  Dr Charlanne  What is your office phone and fax number?      Phone- [phone]  Fax- [phone]  Anesthesia type (None, local, MAC, general) ?       MAC   Please route your response to [employer]

## 2024-06-24 ENCOUNTER — Ambulatory Visit
Admission: RE | Admit: 2024-06-24 | Discharge: 2024-06-24 | Disposition: A | Discharge status: Home / Self Care | Source: Ambulatory Visit | Attending: Radiation Oncology | Admitting: Radiation Oncology

## 2024-06-24 ENCOUNTER — Other Ambulatory Visit: Payer: Self-pay

## 2024-06-24 DIAGNOSIS — I110 Hypertensive heart disease with heart failure: Secondary | ICD-10-CM | POA: Diagnosis not present

## 2024-06-24 DIAGNOSIS — C187 Malignant neoplasm of sigmoid colon: Secondary | ICD-10-CM | POA: Diagnosis not present

## 2024-06-24 LAB — URINE CULTURE
MICRO NUMBER:: 17078759
Result:: NO GROWTH
SPECIMEN QUALITY:: ADEQUATE

## 2024-06-24 LAB — RAD ONC ARIA SESSION SUMMARY
Course Elapsed Days: 32
Plan Fractions Treated to Date: 23
Plan Prescribed Dose Per Fraction: 1.8 Gy
Plan Total Fractions Prescribed: 25
Plan Total Prescribed Dose: 45 Gy
Reference Point Dosage Given to Date: 41.4 Gy
Reference Point Session Dosage Given: 1.8 Gy
Session Number: 23

## 2024-06-27 ENCOUNTER — Other Ambulatory Visit: Payer: Self-pay

## 2024-06-27 ENCOUNTER — Ambulatory Visit: Payer: Self-pay | Admitting: Physician Assistant

## 2024-06-27 ENCOUNTER — Ambulatory Visit

## 2024-06-27 ENCOUNTER — Ambulatory Visit
Admission: RE | Admit: 2024-06-27 | Discharge: 2024-06-27 | Disposition: A | Discharge status: Home / Self Care | Source: Ambulatory Visit | Attending: Radiation Oncology | Admitting: Radiation Oncology

## 2024-06-27 DIAGNOSIS — C187 Malignant neoplasm of sigmoid colon: Secondary | ICD-10-CM | POA: Diagnosis not present

## 2024-06-27 DIAGNOSIS — I513 Intracardiac thrombosis, not elsewhere classified: Secondary | ICD-10-CM

## 2024-06-27 DIAGNOSIS — Z510 Encounter for antineoplastic radiation therapy: Secondary | ICD-10-CM | POA: Diagnosis not present

## 2024-06-27 DIAGNOSIS — I5020 Unspecified systolic (congestive) heart failure: Secondary | ICD-10-CM

## 2024-06-27 LAB — RAD ONC ARIA SESSION SUMMARY
Course Elapsed Days: 35
Plan Fractions Treated to Date: 24
Plan Prescribed Dose Per Fraction: 1.8 Gy
Plan Total Fractions Prescribed: 25
Plan Total Prescribed Dose: 45 Gy
Reference Point Dosage Given to Date: 43.2 Gy
Reference Point Session Dosage Given: 1.8 Gy
Session Number: 24

## 2024-06-28 ENCOUNTER — Other Ambulatory Visit: Payer: Self-pay

## 2024-06-28 ENCOUNTER — Ambulatory Visit

## 2024-06-28 ENCOUNTER — Ambulatory Visit
Admission: RE | Admit: 2024-06-28 | Discharge: 2024-06-28 | Disposition: A | Discharge status: Home / Self Care | Source: Ambulatory Visit | Attending: Radiation Oncology | Admitting: Radiation Oncology

## 2024-06-28 DIAGNOSIS — C187 Malignant neoplasm of sigmoid colon: Secondary | ICD-10-CM | POA: Diagnosis not present

## 2024-06-28 DIAGNOSIS — Z510 Encounter for antineoplastic radiation therapy: Secondary | ICD-10-CM | POA: Diagnosis not present

## 2024-06-28 LAB — RAD ONC ARIA SESSION SUMMARY
Course Elapsed Days: 36
Plan Fractions Treated to Date: 25
Plan Prescribed Dose Per Fraction: 1.8 Gy
Plan Total Fractions Prescribed: 25
Plan Total Prescribed Dose: 45 Gy
Reference Point Dosage Given to Date: 45 Gy
Reference Point Session Dosage Given: 1.8 Gy
Session Number: 25

## 2024-06-29 ENCOUNTER — Ambulatory Visit

## 2024-06-29 DIAGNOSIS — C189 Malignant neoplasm of colon, unspecified: Secondary | ICD-10-CM | POA: Diagnosis not present

## 2024-06-29 DIAGNOSIS — N529 Male erectile dysfunction, unspecified: Secondary | ICD-10-CM

## 2024-06-29 DIAGNOSIS — I252 Old myocardial infarction: Secondary | ICD-10-CM

## 2024-06-29 DIAGNOSIS — I5022 Chronic systolic (congestive) heart failure: Secondary | ICD-10-CM

## 2024-06-29 DIAGNOSIS — Z433 Encounter for attention to colostomy: Secondary | ICD-10-CM

## 2024-06-29 DIAGNOSIS — I739 Peripheral vascular disease, unspecified: Secondary | ICD-10-CM

## 2024-06-29 DIAGNOSIS — I2510 Atherosclerotic heart disease of native coronary artery without angina pectoris: Secondary | ICD-10-CM

## 2024-06-29 DIAGNOSIS — Z48815 Encounter for surgical aftercare following surgery on the digestive system: Secondary | ICD-10-CM

## 2024-06-29 DIAGNOSIS — K5790 Diverticulosis of intestine, part unspecified, without perforation or abscess without bleeding: Secondary | ICD-10-CM

## 2024-06-29 DIAGNOSIS — D630 Anemia in neoplastic disease: Secondary | ICD-10-CM

## 2024-06-29 DIAGNOSIS — I110 Hypertensive heart disease with heart failure: Secondary | ICD-10-CM

## 2024-06-29 DIAGNOSIS — E782 Mixed hyperlipidemia: Secondary | ICD-10-CM

## 2024-06-29 NOTE — Radiation Completion Notes (Addendum)
  Radiation Oncology         [phone] ________________________________  Name: [name] [name]. 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 The patient tolerated radiation. He developed fatigue and anticipated skin changes in the treatment field as well as dysuria and requested to discontinue treatment early so he did not receive the remaining 3 fractions of therapy.   The patient will receive a call in about one month from the radiation oncology department. He will continue follow up with Dr. Cloretta as well as Dr. Polly.     Donald KYM Husband, PAC

## 2024-06-30 ENCOUNTER — Ambulatory Visit

## 2024-07-01 ENCOUNTER — Ambulatory Visit

## 2024-07-01 DIAGNOSIS — C189 Malignant neoplasm of colon, unspecified: Secondary | ICD-10-CM | POA: Diagnosis not present

## 2024-07-01 DIAGNOSIS — Z6823 Body mass index (BMI) 23.0-23.9, adult: Secondary | ICD-10-CM | POA: Diagnosis not present

## 2024-07-01 DIAGNOSIS — I110 Hypertensive heart disease with heart failure: Secondary | ICD-10-CM | POA: Diagnosis not present

## 2024-07-01 DIAGNOSIS — Z955 Presence of coronary angioplasty implant and graft: Secondary | ICD-10-CM | POA: Diagnosis not present

## 2024-07-01 DIAGNOSIS — I739 Peripheral vascular disease, unspecified: Secondary | ICD-10-CM | POA: Diagnosis not present

## 2024-07-01 DIAGNOSIS — Z7901 Long term (current) use of anticoagulants: Secondary | ICD-10-CM | POA: Diagnosis not present

## 2024-07-01 DIAGNOSIS — Z433 Encounter for attention to colostomy: Secondary | ICD-10-CM | POA: Diagnosis not present

## 2024-07-01 DIAGNOSIS — I252 Old myocardial infarction: Secondary | ICD-10-CM | POA: Diagnosis not present

## 2024-07-01 DIAGNOSIS — D630 Anemia in neoplastic disease: Secondary | ICD-10-CM | POA: Diagnosis not present

## 2024-07-01 DIAGNOSIS — I5022 Chronic systolic (congestive) heart failure: Secondary | ICD-10-CM | POA: Diagnosis not present

## 2024-07-01 DIAGNOSIS — Z87891 Personal history of nicotine dependence: Secondary | ICD-10-CM | POA: Diagnosis not present

## 2024-07-01 DIAGNOSIS — Z48815 Encounter for surgical aftercare following surgery on the digestive system: Secondary | ICD-10-CM | POA: Diagnosis not present

## 2024-07-01 DIAGNOSIS — N529 Male erectile dysfunction, unspecified: Secondary | ICD-10-CM | POA: Diagnosis not present

## 2024-07-01 DIAGNOSIS — I2510 Atherosclerotic heart disease of native coronary artery without angina pectoris: Secondary | ICD-10-CM | POA: Diagnosis not present

## 2024-07-01 DIAGNOSIS — K5790 Diverticulosis of intestine, part unspecified, without perforation or abscess without bleeding: Secondary | ICD-10-CM | POA: Diagnosis not present

## 2024-07-01 DIAGNOSIS — E782 Mixed hyperlipidemia: Secondary | ICD-10-CM | POA: Diagnosis not present

## 2024-07-03 NOTE — Telephone Encounter (Signed)
 Anticoagulation d/c'd per 04/20/24 echo notes.

## 2024-07-04 NOTE — Telephone Encounter (Signed)
 Anticoagulation (dabigatran  150 mg bid) was re-started

## 2024-07-04 NOTE — Telephone Encounter (Signed)
 Patient with diagnosis of possible apical thrombus on dabigatran  for anticoagulation.    What type of surgery is being performed?     Colonoscopy at [facility]  When is this surgery scheduled?     08/08/2024   CrCl 86 Platelet count 189  Most recent echocardiogram showed no sign of thrombus, but will confirm with primary cardiologist that we are okay to hold.    If approved by MD, would recommend patient can hold dabigatran  for 2 days prior to procedure, without bridging.     **This guidance is not considered finalized until pre-operative APP has relayed final recommendations.**

## 2024-07-05 NOTE — Telephone Encounter (Signed)
   Name: [name] [name].  DOB: May 15, 1946  MRN: [id number]  Primary Cardiologist: Arun K Thukkani, MD  Chart reviewed as part of pre-operative protocol coverage. The patient has an upcoming visit scheduled with Glendia Ferrier, PA on 07/13/24 at which time clearance can be addressed in case there are any issues that would impact surgical recommendations.  Colonoscopy Is not scheduled until 08/08/24 as below. I added preop FYI to appointment note so that provider is aware to address at time of outpatient visit.  Per office protocol the cardiology provider should forward their finalized clearance decision and recommendations regarding antiplatelet therapy to the requesting party below.    This message will also be routed to pharmacy pool and/or Dr. Wendel for input on holding Pradaxa  as requested below so that this information is available to the clearing provider at time of patient's appointment.   I will route this message as FYI to requesting party and remove this message from the preop box as separate preop APP input not needed at this time.   Please call with any questions.  [name], NP  07/05/2024, 8:06 AM

## 2024-07-06 DIAGNOSIS — I110 Hypertensive heart disease with heart failure: Secondary | ICD-10-CM | POA: Diagnosis not present

## 2024-07-06 DIAGNOSIS — I2510 Atherosclerotic heart disease of native coronary artery without angina pectoris: Secondary | ICD-10-CM | POA: Diagnosis not present

## 2024-07-06 DIAGNOSIS — I252 Old myocardial infarction: Secondary | ICD-10-CM | POA: Diagnosis not present

## 2024-07-06 DIAGNOSIS — Z87891 Personal history of nicotine dependence: Secondary | ICD-10-CM | POA: Diagnosis not present

## 2024-07-06 DIAGNOSIS — D630 Anemia in neoplastic disease: Secondary | ICD-10-CM | POA: Diagnosis not present

## 2024-07-06 DIAGNOSIS — C189 Malignant neoplasm of colon, unspecified: Secondary | ICD-10-CM | POA: Diagnosis not present

## 2024-07-06 DIAGNOSIS — Z955 Presence of coronary angioplasty implant and graft: Secondary | ICD-10-CM | POA: Diagnosis not present

## 2024-07-06 DIAGNOSIS — Z7901 Long term (current) use of anticoagulants: Secondary | ICD-10-CM | POA: Diagnosis not present

## 2024-07-06 DIAGNOSIS — Z433 Encounter for attention to colostomy: Secondary | ICD-10-CM | POA: Diagnosis not present

## 2024-07-06 DIAGNOSIS — Z6823 Body mass index (BMI) 23.0-23.9, adult: Secondary | ICD-10-CM | POA: Diagnosis not present

## 2024-07-06 DIAGNOSIS — Z48815 Encounter for surgical aftercare following surgery on the digestive system: Secondary | ICD-10-CM | POA: Diagnosis not present

## 2024-07-06 DIAGNOSIS — N529 Male erectile dysfunction, unspecified: Secondary | ICD-10-CM | POA: Diagnosis not present

## 2024-07-06 DIAGNOSIS — I739 Peripheral vascular disease, unspecified: Secondary | ICD-10-CM | POA: Diagnosis not present

## 2024-07-06 DIAGNOSIS — E782 Mixed hyperlipidemia: Secondary | ICD-10-CM | POA: Diagnosis not present

## 2024-07-06 DIAGNOSIS — K5790 Diverticulosis of intestine, part unspecified, without perforation or abscess without bleeding: Secondary | ICD-10-CM | POA: Diagnosis not present

## 2024-07-06 DIAGNOSIS — I5022 Chronic systolic (congestive) heart failure: Secondary | ICD-10-CM | POA: Diagnosis not present

## 2024-07-07 ENCOUNTER — Telehealth: Payer: Self-pay

## 2024-07-07 ENCOUNTER — Inpatient Hospital Stay (HOSPITAL_BASED_OUTPATIENT_CLINIC_OR_DEPARTMENT_OTHER): Admitting: Oncology

## 2024-07-07 VITALS — BP 134/73 | HR 72 | Temp 98.4°F | Resp 17 | Wt 136.9 lb

## 2024-07-07 DIAGNOSIS — C187 Malignant neoplasm of sigmoid colon: Secondary | ICD-10-CM | POA: Diagnosis not present

## 2024-07-07 DIAGNOSIS — J449 Chronic obstructive pulmonary disease, unspecified: Secondary | ICD-10-CM | POA: Diagnosis not present

## 2024-07-07 DIAGNOSIS — I2510 Atherosclerotic heart disease of native coronary artery without angina pectoris: Secondary | ICD-10-CM | POA: Diagnosis not present

## 2024-07-07 DIAGNOSIS — R918 Other nonspecific abnormal finding of lung field: Secondary | ICD-10-CM | POA: Diagnosis not present

## 2024-07-07 DIAGNOSIS — I110 Hypertensive heart disease with heart failure: Secondary | ICD-10-CM | POA: Diagnosis not present

## 2024-07-07 DIAGNOSIS — Z9049 Acquired absence of other specified parts of digestive tract: Secondary | ICD-10-CM | POA: Diagnosis not present

## 2024-07-07 DIAGNOSIS — E785 Hyperlipidemia, unspecified: Secondary | ICD-10-CM | POA: Diagnosis not present

## 2024-07-07 NOTE — Telephone Encounter (Signed)
-----   Message from Nurse Devere BROCKS sent at 07/07/2024  1:41 PM EDT ----- Regarding: Upcoming colonoscopy Crista Nuon, Would you have someone on your team call Mr. Kallstrom about the upcoming prep/admission for his colonoscopy. He has some concerns and questions. Thank you!

## 2024-07-07 NOTE — Progress Notes (Signed)
  [name] [name] OFFICE PROGRESS NOTE   Diagnosis: Colon cancer  INTERVAL HISTORY:   [name] [name] returns as scheduled.  He completed radiation 06/28/2024.  He discontinued capecitabine  approximately 1 week into the radiation due to hand/foot symptoms and nausea.  The hand/foot skin changes have resolved.  He feels well.  No difficulty with bowel function.  Objective:  Vital signs in last 24 hours:  Blood pressure 134/73, pulse 72, temperature 98.4 F (36.9 C), temperature source Temporal, resp. rate 17, weight 136 lb 14.4 oz (62.1 kg), SpO2 99%.    HEENT: No thrush or ulcers Lymphatics: No cervical, supraclavicular, axillary, or inguinal nodes Resp: Distant breath sounds, no respiratory distress Cardio: Regular rate and rhythm GI: No hepatosplenomegaly, nontender, no mass, left lower quadrant colostomy Vascular: No leg edema  Skin: Skin thickening and mild erythema at the left greater than right palm, no skin breakdown or erythema at the left sole   Lab Results:  Lab Results  Component Value Date   WBC 5.2 06/16/2024   HGB 12.8 (L) 06/16/2024   HCT 37.4 (L) 06/16/2024   MCV 97.1 06/16/2024   PLT 189 06/16/2024   NEUTROABS 3.7 06/16/2024    CMP  Lab Results  Component Value Date   NA 138 06/16/2024   K 4.2 06/16/2024   CL 102 06/16/2024   CO2 25 06/16/2024   GLUCOSE 92 06/16/2024   BUN 11 06/16/2024   CREATININE 0.63 06/16/2024   CALCIUM  9.7 06/16/2024   PROT 6.7 06/16/2024   ALBUMIN 3.8 06/16/2024   AST 22 06/16/2024   ALT 24 06/16/2024   ALKPHOS 72 06/16/2024   BILITOT 0.7 06/16/2024   GFRNONAA >60 06/16/2024   GFRAA 104 05/01/2020    Lab Results  Component Value Date   CEA1 3.3 02/02/2024     Medications: I have reviewed the patient's current medications.   Assessment/Plan:   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), status post a sigmoidectomy and end colostomy 02/01/2024 0/11 nodes, positive as enteric and distal margin,  macroscopic tumor perforation-carcinoma involves distal disrupted segment CT angio chest/abdomen/pelvis 01/26/2024: Severe emphysema, segmental circumferential sigmoid thickening with associated contained perforation/abscess no adenopathy CT abdomen/pelvis 01/29/2024: Persistent perforated sigmoid diverticulitis with 2 adjacent fluid collections, 1 containing gas CT chest 02/02/2024: Tiny bilateral noncalcified pulmonary nodules nonspecific severe emphysema 03/07/2024-guardant reveal ctDNA not detected Cycle 1 capecitabine  03/14/2024 Cycle 2 capecitabine  04/04/2024 Cycle 3 capecitabine  04/25/2024 Began radiation 05/23/24 x5.5 weeks, discontinue Xeloda  after 1 week, completed radiation 06/28/2024 Guardant reveal ctDNA not detected 06/03/24  COPD CAD CHF, possible early LV thrombus on echocardiogram 01/28/2024, LVEF 25-30%-apixaban  Hypertension Hyperlipidemia Pulmonary nodules-nonspecific pulmonary nodules on CT 01/26/2024 and 02/02/2024   Disposition: [name] [name] has a history of stage IIb colon cancer with positive margins.  He completed a partial course of adjuvant capecitabine  and concurrent capecitabine /radiation.  He developed hand/foot syndrome while on capecitabine .  He declines further capecitabine .  He will return for an office visit, CEA, and guardant reveal in 3 months.  We will plan for surveillance CTs after the next office visit.  He plans to contact Dr. Polly to plan reversal of the colostomy.   Arley Hof, MD  07/07/2024  11:32 AM

## 2024-07-07 NOTE — Telephone Encounter (Signed)
 Called & spoke with patient. Patient was under the impression that due to his cardiac history he would require an overnight stay after his colonoscopy. I informed patient that I did not see this documented, only that he needed medical & pharmacy cardiac clearance. Patient has an appt with Glendia Ferrier, PA on 07/13/24 to further discuss clearance before upcoming procedure. Patient has been advised that Kim, [place] will let him know if he is approved to hold Pradaxa  2 days prior to procedure & if patient needs to take any additional measures before/after colonoscopy. Patient verbalized understanding & had no concerns at the end of the call.

## 2024-07-08 ENCOUNTER — Other Ambulatory Visit: Payer: Self-pay | Admitting: *Deleted

## 2024-07-08 DIAGNOSIS — Z59868 Other specified financial insecurity: Secondary | ICD-10-CM
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
## 2024-07-08 NOTE — Patient Instructions (Signed)
 Visit Information  Thank you for taking time to visit with me today. Please don't hesitate to contact me if I can be of assistance to you before our next scheduled appointment.  Your next care management appointment is by telephone on 08-10-2024 at 10:00 am  Telephone follow-up in 1 month  Please call the care guide team at [phone] if you need to cancel, schedule, or reschedule an appointment.   Please call the Suicide and Crisis Lifeline: 988 call the [place]  National Suicide Prevention Lifeline: 726-534-3719 or TTY: 605 494 5580 TTY [phone]) to talk to a trained counselor call 1-800-273-TALK (toll free, 24 hour hotline) if you are experiencing a Mental Health or Behavioral Health Crisis or need someone to talk to.  Rosina Forte, BSN RN [facility], [facility] RN Care Manager Direct Dial: [phone]  Fax: [phone]

## 2024-07-12 ENCOUNTER — Encounter: Payer: Self-pay | Admitting: *Deleted

## 2024-07-12 NOTE — Assessment & Plan Note (Signed)
 He could not afford Eliquis  and was transitioned to Pradaxa . Most recent TTE with no evidence of thrombus. He has been kept on Pradaxa  b/c he is allergic to Clopidogrel , cannot tolerate Brilinta  and Effient is contraindicated due to age. ***

## 2024-07-12 NOTE — Assessment & Plan Note (Signed)
 Ejection fraction is 25-30%. GDMT has been limited by hypotension. He is NYHA class II. Volume is stable. Blood pressure has limited guideline-directed medical therapy (GDMT) in the past. He could not afford SGLT2 inhibitors. Discussed the potential benefit of spironolactone to advance his GDMT and the possibility of referral to electrophysiology for ICD if EF remains below 35%.  He is not sure if he would opt for ICD and will need to think about it.  - Continue metoprolol  tartrate 12.5 mg twice daily - Continue Losartan  12.5 mg once daily  - Start spironolactone 12.5 mg daily - BMET weekly x 2 - Arrange limited echocardiogram in 3 months to recheck EF - Consider referral to electrophysiology for ICD if EF is still less than 35% - Follow up in 6 months
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
## 2024-07-12 NOTE — Assessment & Plan Note (Signed)
 Coronary artery disease with prior stenting to RCA in 2002 and and subsequent PCI to LAD. Non-STEMI in October 2022 treated with DES x 2 to RCA. LAD stent patent at that time. No symptoms suggestive of angina. He is not on antiplatelet therapy as he is on anticoagulation.   - Continue metoprolol  tartrate 12.5 mg twice daily - Continue rosuvastatin  20 mg daily - follow up 6 mos

## 2024-07-12 NOTE — Assessment & Plan Note (Signed)
 Mixed hyperlipidemia with goal LDL less than 70, ideally less than 55. Primary care recently checked fasting lipids as part of annual physical. - Continue rosuvastatin  20 mg daily

## 2024-07-13 ENCOUNTER — Ambulatory Visit (INDEPENDENT_AMBULATORY_CARE_PROVIDER_SITE_OTHER): Admitting: Family Medicine

## 2024-07-13 ENCOUNTER — Encounter: Payer: Self-pay | Admitting: Physician Assistant

## 2024-07-13 ENCOUNTER — Ambulatory Visit: Attending: Cardiology | Admitting: Physician Assistant

## 2024-07-13 ENCOUNTER — Encounter: Payer: Self-pay | Admitting: Family Medicine

## 2024-07-13 ENCOUNTER — Other Ambulatory Visit (HOSPITAL_COMMUNITY): Payer: Self-pay

## 2024-07-13 VITALS — BP 134/68 | HR 78 | Temp 96.6°F | Ht 66.0 in | Wt 137.6 lb

## 2024-07-13 VITALS — BP 120/70 | HR 79 | Ht 66.0 in | Wt 136.4 lb

## 2024-07-13 DIAGNOSIS — R351 Nocturia: Secondary | ICD-10-CM

## 2024-07-13 DIAGNOSIS — I739 Peripheral vascular disease, unspecified: Secondary | ICD-10-CM

## 2024-07-13 DIAGNOSIS — E559 Vitamin D deficiency, unspecified: Secondary | ICD-10-CM

## 2024-07-13 DIAGNOSIS — E782 Mixed hyperlipidemia: Secondary | ICD-10-CM

## 2024-07-13 DIAGNOSIS — I5020 Unspecified systolic (congestive) heart failure: Secondary | ICD-10-CM

## 2024-07-13 DIAGNOSIS — I110 Hypertensive heart disease with heart failure: Secondary | ICD-10-CM | POA: Diagnosis not present

## 2024-07-13 DIAGNOSIS — N529 Male erectile dysfunction, unspecified: Secondary | ICD-10-CM | POA: Diagnosis not present

## 2024-07-13 DIAGNOSIS — D630 Anemia in neoplastic disease: Secondary | ICD-10-CM | POA: Diagnosis not present

## 2024-07-13 DIAGNOSIS — Z7901 Long term (current) use of anticoagulants: Secondary | ICD-10-CM | POA: Diagnosis not present

## 2024-07-13 DIAGNOSIS — Z433 Encounter for attention to colostomy: Secondary | ICD-10-CM | POA: Diagnosis not present

## 2024-07-13 DIAGNOSIS — I513 Intracardiac thrombosis, not elsewhere classified: Secondary | ICD-10-CM

## 2024-07-13 DIAGNOSIS — Z6823 Body mass index (BMI) 23.0-23.9, adult: Secondary | ICD-10-CM | POA: Diagnosis not present

## 2024-07-13 DIAGNOSIS — I2510 Atherosclerotic heart disease of native coronary artery without angina pectoris: Secondary | ICD-10-CM

## 2024-07-13 DIAGNOSIS — Z01810 Encounter for preprocedural cardiovascular examination: Secondary | ICD-10-CM

## 2024-07-13 DIAGNOSIS — C189 Malignant neoplasm of colon, unspecified: Secondary | ICD-10-CM | POA: Diagnosis not present

## 2024-07-13 DIAGNOSIS — Z0001 Encounter for general adult medical examination with abnormal findings: Secondary | ICD-10-CM

## 2024-07-13 DIAGNOSIS — I10 Essential (primary) hypertension: Secondary | ICD-10-CM

## 2024-07-13 DIAGNOSIS — Z0000 Encounter for general adult medical examination without abnormal findings: Secondary | ICD-10-CM

## 2024-07-13 DIAGNOSIS — K5790 Diverticulosis of intestine, part unspecified, without perforation or abscess without bleeding: Secondary | ICD-10-CM | POA: Diagnosis not present

## 2024-07-13 DIAGNOSIS — Z48815 Encounter for surgical aftercare following surgery on the digestive system: Secondary | ICD-10-CM | POA: Diagnosis not present

## 2024-07-13 DIAGNOSIS — I252 Old myocardial infarction: Secondary | ICD-10-CM | POA: Diagnosis not present

## 2024-07-13 DIAGNOSIS — Z955 Presence of coronary angioplasty implant and graft: Secondary | ICD-10-CM | POA: Diagnosis not present

## 2024-07-13 DIAGNOSIS — I5022 Chronic systolic (congestive) heart failure: Secondary | ICD-10-CM | POA: Diagnosis not present

## 2024-07-13 DIAGNOSIS — Z933 Colostomy status: Secondary | ICD-10-CM

## 2024-07-13 DIAGNOSIS — Z87891 Personal history of nicotine dependence: Secondary | ICD-10-CM | POA: Diagnosis not present

## 2024-07-13 MED ORDER — SPIRONOLACTONE 25 MG PO TABS: 12.5000 mg | ORAL_TABLET | Freq: Every day | ORAL | 3 refills | Status: AC

## 2024-07-13 MED ORDER — DABIGATRAN ETEXILATE MESYLATE 150 MG PO CAPS
150.0000 mg | ORAL_CAPSULE | Freq: Two times a day (BID) | ORAL | 11 refills | Status: DC
  Filled 2024-07-13: qty 60, 30d supply, fill #0

## 2024-07-13 NOTE — Progress Notes (Signed)
 Complete physical exam  Patient: [name] [name].   DOB: Jul 17, 1946   78 y.o. Male  MRN: [id number]  Subjective:    Chief Complaint  Patient presents with   Annual Exam    [name] [name]. is a 79 y.o. male who presents today for a complete physical exam. He reports consuming a general diet. The patient does not participate in regular exercise at present. He generally feels fairly well. He reports sleeping fairly well. He does not have additional problems to discuss today.   [name]. is a 78 year old male with colorectal cancer and heart failure who presents for follow-up after completing chemotherapy and radiation therapy.  He completed six weeks of chemotherapy and 25 radiation treatments approximately two weeks ago. He is scheduled for a colonoscopy on the 24th of next month, followed by a consultation with his surgeon to discuss potential reversal surgery. Recent tests, including a Garnet test and three blood tests, returned negative results.  He experiences persistent fatigue but notes gradual improvement in energy levels. He reports burning during urination, which he notes has been improving over the past few days. A recent urinalysis and urine culture were negative. He also experiences frequent urination at night, which he attributes to high fluid intake, including coffee, Gatorade, and one to two beers per day. No significant changes in vision or hearing are noted.  He is currently taking vitamin D  supplements, Lopressor , and losartan  for blood pressure management. He has been out of Pradaxa  for about a week due to financial constraints and is awaiting assistance for obtaining the medication. He supplements his diet with Equate protein shakes, consuming two to three per day to ensure adequate protein intake.  He mentions occasional accidents due to difficulty maneuvering with his colostomy bag and leg weakness, which he attributes to chemotherapy. He is under the care  of cardiology for heart failure with an ejection fraction of 25-30%. No significant shortness of breath or worsening fatigue. He is also followed by vascular surgery and reports mild leg weakness, which is slightly more than normal but manageable.  Most recent fall risk assessment:    07/13/2024    8:33 AM  Fall Risk   Falls in the past year? 1  Number falls in past yr: 0  Injury with Fall? 0  Risk for fall due to : History of fall(s)  Follow up Falls evaluation completed     Most recent depression screenings:    07/13/2024    8:33 AM 07/08/2024    9:55 AM  PHQ 2/9 Scores  PHQ - 2 Score 0 0  PHQ- 9 Score 2     Vision:Within last year and Dental: No current dental problems  Patient Active Problem List   Diagnosis Date Noted   Colostomy status (HCC) 02/24/2024   Cancer of sigmoid colon (HCC) 02/22/2024   LV (left ventricular) mural thrombus 02/18/2024   PAD (peripheral artery disease) 02/18/2024   Acute diverticulitis 01/26/2024   Seasonal allergic rhinitis due to pollen 01/08/2024   PVC's (premature ventricular contractions) 07/16/2022   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) 07/09/2022   Vitamin D  deficiency 07/09/2022   Primary hypertension 10/22/2021   Status post coronary artery stent placement    HFrEF (heart failure with reduced ejection fraction) (HCC)    Surgical defect of lip 03/13/2020   History of non-ST elevation myocardial infarction (NSTEMI)    Spondylolisthesis at L4-L5 level 06/08/2017   CAD (coronary artery disease)  05/08/2017   Mixed hyperlipidemia 05/08/2017   Erectile dysfunction 03/29/2013   Past Medical History:  Diagnosis Date   Cancer [facility])    Coronary artery disease    Erectile dysfunction    HFrEF (heart failure with reduced ejection fraction) (HCC)    Echocardiogram 07/14/21:  EF 25, global HK, inf HK worse, Gr 2 DD, normal RVSF, mild to mod MR, AV sclerosis w/o AS Echocardiogram 2/23: EF 25-30, global HK, GLS -12.9, normal RVSF,  mild to mod MR, AV sclerosis w/o AS    History of hiatal hernia    a lone time ago   Hyperlipidemia    [place] fracture [facility]) 03/22/2020   Myocardial infarction (HCC)    x 2   Polio    Past Surgical History:  Procedure Laterality Date   APPENDECTOMY     CARDIAC CATHETERIZATION     has two stents placed   CERVICAL SPINE SURGERY     c3-4, 4-5,  5-6, 8 screws and plates   COLOSTOMY Left 02/01/2024   Procedure: CREATION, COLOSTOMY;  Surgeon: Polly Cordella LABOR, MD;  Location: [facility] OR;  Service: General;  Laterality: Left;   CORONARY STENT INTERVENTION N/A 07/15/2021   Procedure: CORONARY STENT INTERVENTION;  Surgeon: Court Dorn PARAS, MD;  [facility] IN[facility];  Service: Cardiovascular;  Laterality: N/A;   CORONARY STENT PLACEMENT     forein body removal     metal sharp removed from right leg   LAPAROTOMY N/A 02/01/2024   Procedure: LAPAROTOMY, EXPLORATORY;  Surgeon: Polly Cordella LABOR, MD;  Location: [facility] OR;  Service: General;  Laterality: N/A;  POSSIBLE OSTOMY   LEFT HEART CATH AND CORONARY ANGIOGRAPHY N/A 11/15/2018   Procedure: LEFT HEART CATH AND CORONARY ANGIOGRAPHY;  Surgeon: Mady Bruckner, MD;  [facility];  Service: Cardiovascular;  Laterality: N/A;   LEFT HEART CATH AND CORONARY ANGIOGRAPHY N/A 07/15/2021   Procedure: LEFT HEART CATH AND CORONARY ANGIOGRAPHY;  Surgeon: Court Dorn PARAS, MD;  [facility];  Service: Cardiovascular;  Laterality: N/A;   MANDIBULAR HARDWARE REMOVAL Bilateral 04/23/2020   Procedure: MANDIBULAR HARDWARE REMOVAL;  Surgeon: Jesus Oliphant, MD;  Location: [facility];  Service: ENT;  Laterality: Bilateral;   ORIF MANDIBULAR FRACTURE N/A 03/14/2020   Procedure: OPEN REDUCTION INTERNAL FIXATION (ORIF) MID  FACE FRACTURE, MANDIBULAR FIXATION MODIFIED ARCH BARS;  Surgeon: Jesus Oliphant, MD;  Location: [facility] OR;  Service: ENT;  Laterality: N/A;   PARTIAL COLECTOMY N/A 02/01/2024   Procedure: COLECTOMY, PARTIAL;   Surgeon: Polly Cordella LABOR, MD;  Location: [facility] OR;  Service: General;  Laterality: N/A;  sigmoid colon   SCAR REVISION N/A 04/23/2020   Procedure: SCAR REVISION/ Upper Lip Repair;  Surgeon: Jesus Oliphant, MD;  Location: [facility];  Service: ENT;  Laterality: N/A;   Social History   Tobacco Use   Smoking status: Former    Types: Cigarettes    Passive exposure: Past   Smokeless tobacco: Never  Vaping Use   Vaping status: Never Used  Substance Use Topics   Alcohol use: Yes    Alcohol/week: 21.0 standard drinks of alcohol    Types: 21 Cans of beer per week    Comment: 2 beers daily   Drug use: No   Social History   Socioeconomic History   Marital status: Divorced    Spouse name: Not on file   Number of children: 2   Years of education: 12   Highest education level:  12th grade  Occupational History   Occupation: retired    Comment: farmer  Tobacco Use   Smoking status: Former    Types: Cigarettes    Passive exposure: Past   Smokeless tobacco: Never  Vaping Use   Vaping status: Never Used  Substance and Sexual Activity   Alcohol use: Yes    Alcohol/week: 21.0 standard drinks of alcohol    Types: 21 Cans of beer per week    Comment: 2 beers daily   Drug use: No   Sexual activity: Not on file  Other Topics Concern   Not on file  Social History Narrative   Mr. Tierce has a restraining order against one of his sons.   Social Drivers of Corporate Investment Banker Strain: Low Risk  (05/20/2024)   Overall Financial Resource Strain (CARDIA)    Difficulty of Paying Living Expenses: Not hard at all  Food Insecurity: No Food Insecurity (05/20/2024)   Hunger Vital Sign    Worried About Running Out of Food in the Last Year: Never true    Ran Out of Food in the Last Year: Never true  Transportation Needs: No Transportation Needs (05/20/2024)   PRAPARE - Administrator, Civil Service (Medical): No    Lack of Transportation (Non-Medical): No  Physical  Activity: Unknown (02/16/2024)   Exercise Vital Sign    Days of Exercise per Week: 0 days    Minutes of Exercise per Session: Not on file  Stress: Stress Concern Present (02/22/2024)   [employer] of Occupational Health - Occupational Stress Questionnaire    Feeling of Stress : To some extent  Social Connections: Moderately Integrated (02/16/2024)   Social Connection and Isolation Panel    Frequency of Communication with Friends and Family: More than three times a week    Frequency of Social Gatherings with Friends and Family: More than three times a week    Attends Religious Services: More than 4 times per year    Active Member of Clubs or Organizations: Yes    Attends Banker Meetings: More than 4 times per year    Marital Status: Divorced  Intimate Partner Violence: Not At Risk (05/20/2024)   Humiliation, Afraid, Rape, and Kick questionnaire    Fear of Current or Ex-Partner: No    Emotionally Abused: No    Physically Abused: No    Sexually Abused: No   Family Status  Relation Name Status   Mother  Deceased   Father  Deceased  No partnership data on file   Family History  Problem Relation Age of Onset   Diabetes Mother    Heart disease Mother    Heart disease Father    Allergies  Allergen Reactions   Brilinta  [Ticagrelor ] Shortness Of Breath   Carvedilol  Diarrhea   Lipitor [Atorvastatin] Other (See Comments)    Makes pt feel funky   Plavix  [Clopidogrel ] Rash      Patient Care Team: Taiya Nutting, Rock HERO, FNP as PCP - General (Family Medicine) Thukkani, Arun K, MD as PCP - Cardiology (Cardiology) [name] as Physician Assistant (Cardiology) Bertrum Rosina HERO, RN as VBCI Care Management Clemons, Tillman KATHEE georgann DESIREE Care Management   Outpatient Medications Prior to Visit  Medication Sig   cholecalciferol  (VITAMIN D3) 25 MCG (1000 UNIT) tablet Take 1,000 Units by mouth daily.    ketoconazole (NIZORAL) 2 % cream Apply 1 Application topically 2  (two) times daily.   losartan  (COZAAR ) 25 MG tablet  Take 0.5 tablets (12.5 mg total) by mouth daily.   metoprolol  tartrate (LOPRESSOR ) 25 MG tablet Take 0.5 tablets (12.5 mg total) by mouth 2 (two) times daily.   nitroGLYCERIN  (NITROSTAT ) 0.4 MG SL tablet Place 1 tablet (0.4 mg total) under the tongue every 5 (five) minutes as needed for chest pain.   oxyCODONE  (OXY IR/ROXICODONE ) 5 MG immediate release tablet Take 1-2 tablets (5-10 mg total) by mouth every 6 (six) hours as needed for severe pain (pain score 7-10).   rosuvastatin  (CRESTOR ) 20 MG tablet Take 1 tablet (20 mg total) by mouth daily.   dabigatran  (PRADAXA ) 150 MG CAPS capsule Take 1 capsule (150 mg total) by mouth 2 (two) times daily. (Patient not taking: Reported on 07/13/2024)   [DISCONTINUED] ondansetron  (ZOFRAN ) 8 MG tablet Take 1 tablet (8 mg total) by mouth every 8 (eight) hours as needed for nausea. (Patient not taking: Reported on 07/07/2024)   No facility-administered medications prior to visit.    ROS per HPI      Objective:     BP 134/68   Pulse 78   Temp (!) 96.6 F (35.9 C)   Ht 5' 6 (1.676 m)   Wt 137 lb 9.6 oz (62.4 kg)   SpO2 97%   BMI 22.21 kg/m  BP Readings from Last 3 Encounters:  07/13/24 134/68  07/07/24 134/73  06/23/24 118/70   Wt Readings from Last 3 Encounters:  07/13/24 137 lb 9.6 oz (62.4 kg)  07/07/24 136 lb 14.4 oz (62.1 kg)  06/23/24 139 lb (63 kg)      Physical Exam Vitals and nursing note reviewed.  Constitutional:      General: He is not in acute distress.    Appearance: Normal appearance. He is ill-appearing (chronically). He is not toxic-appearing or diaphoretic.  HENT:     Head: Normocephalic and atraumatic.     Right Ear: Tympanic membrane, ear canal and external ear normal.     Left Ear: Tympanic membrane, ear canal and external ear normal.     Nose: Nose normal.     Mouth/Throat:     Mouth: Mucous membranes are moist.     Pharynx: Oropharynx is clear.  Eyes:      Conjunctiva/sclera: Conjunctivae normal.     Pupils: Pupils are equal, round, and reactive to light.  Cardiovascular:     Rate and Rhythm: Normal rate and regular rhythm.     Heart sounds: Normal heart sounds.  Pulmonary:     Effort: Pulmonary effort is normal.     Breath sounds: Normal breath sounds.  Abdominal:     General: Bowel sounds are normal.     Comments: Colostomy   Musculoskeletal:     Cervical back: Normal range of motion and neck supple.     Right lower leg: No edema.     Left lower leg: No edema.  Skin:    General: Skin is warm and dry.     Capillary Refill: Capillary refill takes less than 2 seconds.  Neurological:     General: No focal deficit present.     Mental Status: He is alert and oriented to person, place, and time.     Gait: Gait abnormal (antalgic).  Psychiatric:        Mood and Affect: Mood normal.        Behavior: Behavior normal.        Thought Content: Thought content normal.        Judgment: Judgment normal.  Last CBC Lab Results  Component Value Date   WBC 5.2 06/16/2024   HGB 12.8 (L) 06/16/2024   HCT 37.4 (L) 06/16/2024   MCV 97.1 06/16/2024   MCH 33.2 06/16/2024   RDW 16.0 (H) 06/16/2024   PLT 189 06/16/2024   Last metabolic panel Lab Results  Component Value Date   GLUCOSE 92 06/16/2024   NA 138 06/16/2024   K 4.2 06/16/2024   CL 102 06/16/2024   CO2 25 06/16/2024   BUN 11 06/16/2024   CREATININE 0.63 06/16/2024   GFRNONAA >60 06/16/2024   CALCIUM  9.7 06/16/2024   PROT 6.7 06/16/2024   ALBUMIN 3.8 06/16/2024   LABGLOB 1.9 02/24/2024   AGRATIO 1.7 07/09/2022   BILITOT 0.7 06/16/2024   ALKPHOS 72 06/16/2024   AST 22 06/16/2024   ALT 24 06/16/2024   ANIONGAP 10 06/16/2024   Last lipids Lab Results  Component Value Date   CHOL 139 02/24/2024   HDL 40 02/24/2024   LDLCALC 83 02/24/2024   TRIG 80 02/24/2024   CHOLHDL 3.5 02/24/2024   Last hemoglobin A1c Lab Results  Component Value Date   HGBA1C 5.2 07/13/2021    Last thyroid  functions Lab Results  Component Value Date   TSH 0.631 02/24/2024   T4TOTAL 6.5 02/24/2024   Last vitamin D  Lab Results  Component Value Date   VD25OH 26.8 (L) 02/24/2024   Last vitamin B12 and Folate Lab Results  Component Value Date   VITAMINB12 625 02/24/2024        Assessment & Plan:    Routine Health Maintenance and Physical Exam  Immunization History  Administered Date(s) Administered   Pneumococcal Polysaccharide-23 03/29/2013    Health Maintenance  Topic Date Due   COVID-19 Vaccine (1) 07/29/2024 (Originally 01/09/1951)   Zoster Vaccines- Shingrix (1 of 2) 10/13/2024 (Originally 01/08/1965)   DTaP/Tdap/Td (1 - Tdap) 01/07/2025 (Originally 01/08/1965)   Influenza Vaccine  01/16/2025 (Originally 04/15/2024)   Pneumococcal Vaccine: 50+ Years (2 of 2 - PCV) 07/13/2025 (Originally 03/29/2014)   Medicare Annual Wellness (AWV)  02/15/2025   Hepatitis C Screening  Completed   Meningococcal B Vaccine  Aged Out    Discussed health benefits of physical activity, and encouraged him to engage in regular exercise appropriate for his age and condition.  Problem List Items Addressed This Visit       Cardiovascular and Mediastinum   HFrEF (heart failure with reduced ejection fraction) (HCC)   Primary hypertension   Relevant Orders   TSH   T4, free   Lipid panel   CMP14+EGFR   CBC with Differential/Platelet   PAD (peripheral artery disease)     Other   Mixed hyperlipidemia   Relevant Orders   Lipid panel   CMP14+EGFR   Vitamin D  deficiency - Primary   Relevant Orders   Vitamin D , 25-hydroxy   CMP14+EGFR   Colostomy status (HCC)   Other Visit Diagnoses       Annual physical exam         Nocturia       Relevant Orders   PSA, total and free     Colorectal cancer, status post chemoradiation and colostomy. Completed chemotherapy and radiation therapy approximately two weeks ago. Awaiting colonoscopy scheduled for the 24th to assess current  status and determine next steps, including potential surgical reversal. Recent Garnet test and three blood tests returned negative, indicating no current evidence of cancer. - Proceed with colonoscopy on the 24th - Contact surgeon post-colonoscopy for further management and  potential surgical reversal  Colostomy status Colostomy functioning well with no blood in stools. Emptying several times a day, which is adequate to prevent constipation. Monitoring food intake closely, avoiding tough meats, and ensuring thorough chewing to aid digestion. - Maintain current dietary practices to ensure proper digestion and prevent constipation  Radiation cystitis, improving Experiencing burning sensation during urination, likely due to radiation cystitis. Symptoms have been improving over the last few days. Recent urinalysis and urine culture were negative, indicating no infection.  Heart failure with reduced ejection fraction (HFrEF) Ejection fraction remains low at 25-30%. No significant shortness of breath or fatigue reported. Cardiologist appointment scheduled for today to assess current status and provide clearance for upcoming colonoscopy. Financial constraints affecting medication adherence, specifically Pradaxa . - Attend cardiology appointment for assessment and clearance for colonoscopy - Discuss medication options and potential samples with cardiologist due to financial constraints - Consider contacting vascular surgeon for additional medication samples if needed  Essential hypertension Blood pressure managed with Lopressor  and losartan . - Continue current antihypertensive regimen  Protein-calorie malnutrition, mild Maintaining protein intake through dietary adjustments and protein shakes. Consuming 2-3 protein shakes daily, providing adequate protein intake of at least 50 grams per day. No adverse effects from protein shakes reported. - Continue current protein intake regimen with 2-3 protein  shakes daily  Vitamin D  deficiency Vitamin D  deficiency managed with supplementation. - Continue vitamin D  supplementation  Adult Wellness Visit Routine adult wellness visit. No significant changes in hearing or vision. Recent eye examination showed minimal changes. No issues with skin or oral health. Appetite is good, and he is eating well. No significant fatigue or shortness of breath related to heart condition.  - Continue current medications including vitamin D  supplement, Lopressor , and losartan  - Monitor for any changes in vision or hearing   Return in about 6 months (around 01/11/2025), or if symptoms worsen or fail to improve, for chronic follow up.     Rosaline Bruns, FNP

## 2024-07-13 NOTE — Patient Instructions (Addendum)
 Medication Instructions:  Your physician has recommended you make the following change in your medication:  Start taking Spironolactone 12.5 mg once daily Continue taking all other medications prescribed  Labwork: BMET in one week at [facility] (07/20/2024) and repeat again the following 2 weeks (08/03/2024)  Testing/Procedures: Your physician has requested that you have an echocardiogram in 3 months. Echocardiography is a painless test that uses sound waves to create images of your heart. It provides your doctor with information about the size and shape of your heart and how well your heart's chambers and valves are working. This procedure takes approximately one hour. There are no restrictions for this procedure. Please do NOT wear cologne, perfume, aftershave, or lotions (deodorant is allowed). Please arrive 15 minutes prior to your appointment time.  Please note: We ask at that you not bring children with you during ultrasound (echo/ vascular) testing. Due to room size and safety concerns, children are not allowed in the ultrasound rooms during exams. Our front office staff cannot provide observation of children in our lobby area while testing is being conducted. An adult accompanying a patient to their appointment will only be allowed in the ultrasound room at the discretion of the ultrasound technician under special circumstances. We apologize for any inconvenience.   Follow-Up: Your physician recommends that you schedule a follow-up appointment in: 6 months  Any Other Special Instructions Will Be Listed Below (If Applicable). Thank you for choosing [facility]!     If you need a refill on your cardiac medications before your next appointment, please call your pharmacy.

## 2024-07-14 ENCOUNTER — Ambulatory Visit: Payer: Self-pay | Admitting: Family Medicine

## 2024-07-14 LAB — CMP14+EGFR
ALT: 27 IU/L (ref 0–44)
AST: 28 IU/L (ref 0–40)
Albumin: 4.1 g/dL (ref 3.8–4.8)
Alkaline Phosphatase: 75 IU/L (ref 47–123)
BUN/Creatinine Ratio: 16 (ref 10–24)
BUN: 12 mg/dL (ref 8–27)
Bilirubin Total: 0.5 mg/dL (ref 0.0–1.2)
CO2: 23 mmol/L (ref 20–29)
Calcium: 9.6 mg/dL (ref 8.6–10.2)
Chloride: 101 mmol/L (ref 96–106)
Creatinine, Ser: 0.73 mg/dL — ABNORMAL LOW (ref 0.76–1.27)
Globulin, Total: 2.4 g/dL (ref 1.5–4.5)
Glucose: 86 mg/dL (ref 70–99)
Potassium: 4.5 mmol/L (ref 3.5–5.2)
Sodium: 137 mmol/L (ref 134–144)
Total Protein: 6.5 g/dL (ref 6.0–8.5)
eGFR: 93 mL/min/1.73 (ref >59)

## 2024-07-14 LAB — CBC WITH DIFFERENTIAL/PLATELET
Basophils Absolute: 0 x10E3/uL (ref 0.0–0.2)
Basos: 1 %
EOS (ABSOLUTE): 0.3 x10E3/uL (ref 0.0–0.4)
Eos: 6 %
Hematocrit: 43.1 % (ref 37.5–51.0)
Hemoglobin: 14.1 g/dL (ref 13.0–17.7)
Immature Grans (Abs): 0.1 x10E3/uL (ref 0.0–0.1)
Immature Granulocytes: 1 %
Lymphocytes Absolute: 0.5 x10E3/uL — ABNORMAL LOW (ref 0.7–3.1)
Lymphs: 9 %
MCH: 32.6 pg (ref 26.6–33.0)
MCHC: 32.7 g/dL (ref 31.5–35.7)
MCV: 100 fL — ABNORMAL HIGH (ref 79–97)
Monocytes Absolute: 0.6 x10E3/uL (ref 0.1–0.9)
Monocytes: 11 %
Neutrophils Absolute: 3.5 x10E3/uL (ref 1.4–7.0)
Neutrophils: 72 %
Platelets: 161 x10E3/uL (ref 150–450)
RBC: 4.32 x10E6/uL (ref 4.14–5.80)
RDW: 13.2 % (ref 11.6–15.4)
WBC: 4.9 x10E3/uL (ref 3.4–10.8)

## 2024-07-14 LAB — LIPID PANEL
Chol/HDL Ratio: 3 ratio (ref 0.0–5.0)
Cholesterol, Total: 143 mg/dL (ref 100–199)
HDL: 47 mg/dL (ref >39)
LDL Chol Calc (NIH): 78 mg/dL (ref 0–99)
Triglycerides: 95 mg/dL (ref 0–149)
VLDL Cholesterol Cal: 18 mg/dL (ref 5–40)

## 2024-07-14 LAB — PSA, TOTAL AND FREE
PSA, Free Pct: 21.7 %
PSA, Free: 0.13 ng/mL
Prostate Specific Ag, Serum: 0.6 ng/mL (ref 0.0–4.0)

## 2024-07-14 LAB — VITAMIN D 25 HYDROXY (VIT D DEFICIENCY, FRACTURES): Vit D, 25-Hydroxy: 28 ng/mL — AB (ref 30.0–100.0)

## 2024-07-14 LAB — T4, FREE: Free T4: 1.2 ng/dL (ref 0.82–1.77)

## 2024-07-14 LAB — TSH: TSH: 1.18 u[IU]/mL (ref 0.450–4.500)

## 2024-07-14 NOTE — Telephone Encounter (Signed)
 Notes routed to GI. Glendia Ferrier, PA-C    07/14/2024 7:53 AM

## 2024-07-15 ENCOUNTER — Other Ambulatory Visit (INDEPENDENT_AMBULATORY_CARE_PROVIDER_SITE_OTHER)

## 2024-07-15 DIAGNOSIS — I739 Peripheral vascular disease, unspecified: Secondary | ICD-10-CM

## 2024-07-15 MED ORDER — DABIGATRAN ETEXILATE MESYLATE 150 MG PO CAPS: 150.0000 mg | ORAL_CAPSULE | Freq: Two times a day (BID) | ORAL | 5 refills | Status: AC

## 2024-07-15 NOTE — Progress Notes (Signed)
 07/15/2024 Name: [name] [name]. MRN: [id number] DOB: [birth year]  Chief Complaint  Patient presents with   Medication Assistance    [name] [name]. is a 78 y.o. year old male who presented for a telephone visit.   They were referred to the pharmacist by their PCP for assistance in managing medication access.    Subjective:  Care Team: Primary Care Provider: Severa Rock HERO, FNP   Medication Access/Adherence  Current Pharmacy:  [facility] [place], [place] - [address] [place] [address] Phone: [phone] Fax: [phone]  [phone] - [place], [place] - [address] [place] [address] Phone: [phone] Fax: [phone]  [id number] [place] [place] - [address] [place] [address] Phone: [phone] Fax: [phone]   Patient reports affordability concerns with their medications: Yes  Patient reports access/transportation concerns to their pharmacy: No  Patient reports adherence concerns with their medications:  No  blood thinner   Patient needs assistance with Pradaxa  [facility]) $[facility] at [facility] (24-month supply) [facility] Denies signs/symptoms of bleeding   Objective:  Lab Results  Component Value Date   HGBA1C 5.2 07/13/2021    Lab Results  Component Value Date   CREATININE 0.79 07/20/2024   BUN 11 07/20/2024   NA 135 07/20/2024   K 4.7 07/20/2024   CL 99 07/20/2024   CO2 23 07/20/2024    Lab Results  Component Value Date   CHOL 143 07/13/2024   HDL 47 07/13/2024   LDLCALC 78 07/13/2024   TRIG 95 07/13/2024   CHOLHDL 3.0 07/13/2024    Medications Reviewed Today     Reviewed by Billee [name] [name], [facility] (Pharmacist) on 07/22/24 at 1059  Med List Status: <None>   Medication Order Taking? Sig Documenting Provider Last Dose Status Informant  cholecalciferol   (VITAMIN D3) 25 MCG (1000 UNIT) tablet [id number]  Take 1,000 Units by mouth daily.  [provider]  Active Self, Pharmacy Records  dabigatran  (PRADAXA ) 150 MG CAPS capsule [id number]  Take 1 capsule (150 mg total) by mouth 2 (two) times daily. Please use good rx for $55 Severa Rock HERO, FNP  Active   ketoconazole (NIZORAL) 2 % cream [id number]  Apply 1 Application topically 2 (two) times daily. [provider]  Active Self, Pharmacy Records  losartan  (COZAAR ) 25 MG tablet [id number]  Take 0.5 tablets (12.5 mg total) by mouth daily. Lelon Hamilton T, PA-C  Active   metoprolol  tartrate (LOPRESSOR ) 25 MG tablet [id number]  Take 0.5 tablets (12.5 mg total) by mouth 2 (two) times daily. Lelon Hamilton T, PA-C  Active   nitroGLYCERIN  (NITROSTAT ) 0.4 MG SL tablet [id number]  Place 1 tablet (0.4 mg total) under the tongue every 5 (five) minutes as needed for chest pain.  Patient not taking: Reported on 07/13/2024   Merlynn Niki FALCON, FNP  Active Self, Pharmacy Records           Med Note SOUNDRA, BOBETTA JINNY [place] Jul 13, 2024 11:16 AM) Need refill  oxyCODONE  (OXY IR/ROXICODONE ) 5 MG immediate release tablet [id number]  Take 1-2 tablets (5-10 mg total) by mouth every 6 (six) hours as needed for severe pain (pain score 7-10).  Patient not taking: Reported on 07/13/2024   Krishnan, Gokul, MD  Active  Med Note SOUNDRA, EBONY J   Wed Jul 13, 2024 11:16 AM) Has not used it  rosuvastatin  (CRESTOR ) 20 MG tablet [id number]  Take 1 tablet (20 mg total) by mouth daily. Lelon Hamilton T, PA-C  Active   spironolactone (ALDACTONE) 25 MG tablet [id number]  Take 0.5 tablets (12.5 mg total) by mouth daily. Lelon Hamilton DASEN, PA-C  Active   Med List Note Teretha Renaee SAILOR, RPH-CPP 03/08/24 9079): Xeloda  filled at [facility] (Specialty)            A/P:  [facility] Denies signs/symptoms of bleeding     Coupon mailed and printed for patient to pick up front Defer  management of Pradaxa  to cardiology    [name] [name], PharmD, BCACP, CPP Clinical Pharmacist, [facility]

## 2024-07-18 NOTE — Telephone Encounter (Signed)
 Spoke with the patient and he is aware to hold Pradaxa  2 days before his procedure See cardiology note from 10/29. Patient aware

## 2024-07-18 NOTE — Progress Notes (Signed)
  Radiation Oncology         [phone]) [phone] ________________________________  Name: [name] [name]. 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   Symptoms of fatigue have improved since completing therapy.  Symptoms of skin changes have improved since completing therapy.   Symptoms of bladder changes have improved since completing therapy.  Symptoms of bowel changes have improved since completing therapy. Pt still having some occasional diarrhea but overall better. He has a colonoscopy scheduled for 08/08/24.  The patient has a scheduled follow up with his medical oncologist Dr. Cloretta and their surgeon Dr. Polly for ongoing surveillance. he  was encouraged to call if he  develops concerns or questions regarding radiation.

## 2024-07-20 ENCOUNTER — Other Ambulatory Visit

## 2024-07-20 ENCOUNTER — Ambulatory Visit: Payer: Self-pay | Admitting: Physician Assistant

## 2024-07-20 DIAGNOSIS — I513 Intracardiac thrombosis, not elsewhere classified: Secondary | ICD-10-CM | POA: Diagnosis not present

## 2024-07-20 DIAGNOSIS — E782 Mixed hyperlipidemia: Secondary | ICD-10-CM | POA: Diagnosis not present

## 2024-07-20 DIAGNOSIS — I2510 Atherosclerotic heart disease of native coronary artery without angina pectoris: Secondary | ICD-10-CM | POA: Diagnosis not present

## 2024-07-20 LAB — BASIC METABOLIC PANEL WITH GFR
BUN/Creatinine Ratio: 14 (ref 10–24)
BUN: 11 mg/dL (ref 8–27)
CO2: 23 mmol/L (ref 20–29)
Calcium: 9.3 mg/dL (ref 8.6–10.2)
Chloride: 99 mmol/L (ref 96–106)
Creatinine, Ser: 0.79 mg/dL (ref 0.76–1.27)
Glucose: 88 mg/dL (ref 70–99)
Potassium: 4.7 mmol/L (ref 3.5–5.2)
Sodium: 135 mmol/L (ref 134–144)
eGFR: 91 mL/min/1.73 (ref >59)

## 2024-07-22 DIAGNOSIS — I252 Old myocardial infarction: Secondary | ICD-10-CM | POA: Diagnosis not present

## 2024-07-22 DIAGNOSIS — I2510 Atherosclerotic heart disease of native coronary artery without angina pectoris: Secondary | ICD-10-CM | POA: Diagnosis not present

## 2024-07-22 DIAGNOSIS — D630 Anemia in neoplastic disease: Secondary | ICD-10-CM | POA: Diagnosis not present

## 2024-07-22 DIAGNOSIS — K5790 Diverticulosis of intestine, part unspecified, without perforation or abscess without bleeding: Secondary | ICD-10-CM | POA: Diagnosis not present

## 2024-07-22 DIAGNOSIS — N529 Male erectile dysfunction, unspecified: Secondary | ICD-10-CM | POA: Diagnosis not present

## 2024-07-22 DIAGNOSIS — Z7901 Long term (current) use of anticoagulants: Secondary | ICD-10-CM | POA: Diagnosis not present

## 2024-07-22 DIAGNOSIS — Z433 Encounter for attention to colostomy: Secondary | ICD-10-CM | POA: Diagnosis not present

## 2024-07-22 DIAGNOSIS — C189 Malignant neoplasm of colon, unspecified: Secondary | ICD-10-CM | POA: Diagnosis not present

## 2024-07-22 DIAGNOSIS — Z48815 Encounter for surgical aftercare following surgery on the digestive system: Secondary | ICD-10-CM | POA: Diagnosis not present

## 2024-07-22 DIAGNOSIS — E782 Mixed hyperlipidemia: Secondary | ICD-10-CM | POA: Diagnosis not present

## 2024-07-22 DIAGNOSIS — I739 Peripheral vascular disease, unspecified: Secondary | ICD-10-CM | POA: Diagnosis not present

## 2024-07-22 DIAGNOSIS — Z87891 Personal history of nicotine dependence: Secondary | ICD-10-CM | POA: Diagnosis not present

## 2024-07-22 DIAGNOSIS — Z6823 Body mass index (BMI) 23.0-23.9, adult: Secondary | ICD-10-CM | POA: Diagnosis not present

## 2024-07-22 DIAGNOSIS — Z955 Presence of coronary angioplasty implant and graft: Secondary | ICD-10-CM | POA: Diagnosis not present

## 2024-07-22 DIAGNOSIS — I5022 Chronic systolic (congestive) heart failure: Secondary | ICD-10-CM | POA: Diagnosis not present

## 2024-07-22 DIAGNOSIS — I110 Hypertensive heart disease with heart failure: Secondary | ICD-10-CM | POA: Diagnosis not present

## 2024-07-28 ENCOUNTER — Other Ambulatory Visit

## 2024-07-28 DIAGNOSIS — E782 Mixed hyperlipidemia: Secondary | ICD-10-CM | POA: Diagnosis not present

## 2024-07-28 DIAGNOSIS — I513 Intracardiac thrombosis, not elsewhere classified: Secondary | ICD-10-CM | POA: Diagnosis not present

## 2024-07-28 DIAGNOSIS — I5020 Unspecified systolic (congestive) heart failure: Secondary | ICD-10-CM | POA: Diagnosis not present

## 2024-07-28 DIAGNOSIS — I2510 Atherosclerotic heart disease of native coronary artery without angina pectoris: Secondary | ICD-10-CM | POA: Diagnosis not present

## 2024-07-28 LAB — BASIC METABOLIC PANEL WITH GFR
BUN/Creatinine Ratio: 14 (ref 10–24)
BUN: 10 mg/dL (ref 8–27)
CO2: 24 mmol/L (ref 20–29)
Calcium: 9.3 mg/dL (ref 8.6–10.2)
Chloride: 98 mmol/L (ref 96–106)
Creatinine, Ser: 0.73 mg/dL — ABNORMAL LOW (ref 0.76–1.27)
Glucose: 96 mg/dL (ref 70–99)
Potassium: 4.9 mmol/L (ref 3.5–5.2)
Sodium: 134 mmol/L (ref 134–144)
eGFR: 93 mL/min/1.73 (ref >59)

## 2024-08-01 ENCOUNTER — Telehealth: Payer: Self-pay | Admitting: Gastroenterology

## 2024-08-01 ENCOUNTER — Ambulatory Visit
Admission: RE | Admit: 2024-08-01 | Discharge: 2024-08-01 | Disposition: A | Discharge status: Home / Self Care | Source: Ambulatory Visit | Attending: Radiation Oncology | Admitting: Radiation Oncology

## 2024-08-01 ENCOUNTER — Encounter (HOSPITAL_COMMUNITY): Payer: Self-pay | Admitting: Gastroenterology

## 2024-08-01 DIAGNOSIS — C187 Malignant neoplasm of sigmoid colon: Secondary | ICD-10-CM

## 2024-08-01 NOTE — Progress Notes (Signed)
 Attempted to obtain medical history for pre op call via telephone, unable to reach at this time. HIPAA compliant voicemail message left requesting return call to pre surgical testing department.

## 2024-08-01 NOTE — Telephone Encounter (Addendum)
 Procedure:Colonoscopy Procedure date: 08/08/24 Procedure location: [facility] Arrival Time: 8:00 am Spoke with the patient Y/N: Yes Any prep concerns? No Has the patient obtained the prep from the pharmacy ? Will pick it up today. Do you have a care partner and transportation: Yes Any additional concerns? No

## 2024-08-02 DIAGNOSIS — Z87891 Personal history of nicotine dependence: Secondary | ICD-10-CM | POA: Diagnosis not present

## 2024-08-02 DIAGNOSIS — I2510 Atherosclerotic heart disease of native coronary artery without angina pectoris: Secondary | ICD-10-CM | POA: Diagnosis not present

## 2024-08-02 DIAGNOSIS — C189 Malignant neoplasm of colon, unspecified: Secondary | ICD-10-CM | POA: Diagnosis not present

## 2024-08-02 DIAGNOSIS — Z955 Presence of coronary angioplasty implant and graft: Secondary | ICD-10-CM | POA: Diagnosis not present

## 2024-08-02 DIAGNOSIS — D630 Anemia in neoplastic disease: Secondary | ICD-10-CM | POA: Diagnosis not present

## 2024-08-02 DIAGNOSIS — I5022 Chronic systolic (congestive) heart failure: Secondary | ICD-10-CM | POA: Diagnosis not present

## 2024-08-02 DIAGNOSIS — Z433 Encounter for attention to colostomy: Secondary | ICD-10-CM | POA: Diagnosis not present

## 2024-08-02 DIAGNOSIS — N529 Male erectile dysfunction, unspecified: Secondary | ICD-10-CM | POA: Diagnosis not present

## 2024-08-02 DIAGNOSIS — Z7901 Long term (current) use of anticoagulants: Secondary | ICD-10-CM | POA: Diagnosis not present

## 2024-08-02 DIAGNOSIS — I110 Hypertensive heart disease with heart failure: Secondary | ICD-10-CM | POA: Diagnosis not present

## 2024-08-02 DIAGNOSIS — K5790 Diverticulosis of intestine, part unspecified, without perforation or abscess without bleeding: Secondary | ICD-10-CM | POA: Diagnosis not present

## 2024-08-02 DIAGNOSIS — E782 Mixed hyperlipidemia: Secondary | ICD-10-CM | POA: Diagnosis not present

## 2024-08-02 DIAGNOSIS — I739 Peripheral vascular disease, unspecified: Secondary | ICD-10-CM | POA: Diagnosis not present

## 2024-08-02 DIAGNOSIS — I252 Old myocardial infarction: Secondary | ICD-10-CM | POA: Diagnosis not present

## 2024-08-02 DIAGNOSIS — Z48815 Encounter for surgical aftercare following surgery on the digestive system: Secondary | ICD-10-CM | POA: Diagnosis not present

## 2024-08-02 DIAGNOSIS — Z6823 Body mass index (BMI) 23.0-23.9, adult: Secondary | ICD-10-CM | POA: Diagnosis not present

## 2024-08-02 NOTE — Progress Notes (Signed)
Patient has been notified directly; all questions, if any, were answered. Patient voiced understanding.   

## 2024-08-08 ENCOUNTER — Encounter (HOSPITAL_COMMUNITY)
Admission: RE | Disposition: A | Discharge status: Home / Self Care | Payer: Self-pay | Source: Home / Self Care | Attending: Gastroenterology

## 2024-08-08 ENCOUNTER — Ambulatory Visit (HOSPITAL_COMMUNITY)
Admission: RE | Admit: 2024-08-08 | Discharge: 2024-08-08 | Disposition: A | Discharge status: Home / Self Care | Attending: Gastroenterology | Admitting: Gastroenterology

## 2024-08-08 ENCOUNTER — Ambulatory Visit (HOSPITAL_COMMUNITY)

## 2024-08-08 ENCOUNTER — Other Ambulatory Visit: Payer: Self-pay

## 2024-08-08 ENCOUNTER — Ambulatory Visit (HOSPITAL_BASED_OUTPATIENT_CLINIC_OR_DEPARTMENT_OTHER)

## 2024-08-08 ENCOUNTER — Encounter (HOSPITAL_COMMUNITY): Payer: Self-pay | Admitting: Gastroenterology

## 2024-08-08 DIAGNOSIS — D128 Benign neoplasm of rectum: Secondary | ICD-10-CM | POA: Insufficient documentation

## 2024-08-08 DIAGNOSIS — I10 Essential (primary) hypertension: Secondary | ICD-10-CM | POA: Insufficient documentation

## 2024-08-08 DIAGNOSIS — R351 Nocturia: Secondary | ICD-10-CM | POA: Diagnosis not present

## 2024-08-08 DIAGNOSIS — Z79899 Other long term (current) drug therapy: Secondary | ICD-10-CM | POA: Diagnosis not present

## 2024-08-08 DIAGNOSIS — Z955 Presence of coronary angioplasty implant and graft: Secondary | ICD-10-CM | POA: Diagnosis not present

## 2024-08-08 DIAGNOSIS — Z87891 Personal history of nicotine dependence: Secondary | ICD-10-CM | POA: Insufficient documentation

## 2024-08-08 DIAGNOSIS — Z1211 Encounter for screening for malignant neoplasm of colon: Secondary | ICD-10-CM | POA: Diagnosis not present

## 2024-08-08 DIAGNOSIS — I2510 Atherosclerotic heart disease of native coronary artery without angina pectoris: Secondary | ICD-10-CM

## 2024-08-08 DIAGNOSIS — Z933 Colostomy status: Secondary | ICD-10-CM

## 2024-08-08 DIAGNOSIS — J439 Emphysema, unspecified: Secondary | ICD-10-CM | POA: Diagnosis not present

## 2024-08-08 DIAGNOSIS — Z9049 Acquired absence of other specified parts of digestive tract: Secondary | ICD-10-CM | POA: Insufficient documentation

## 2024-08-08 DIAGNOSIS — C187 Malignant neoplasm of sigmoid colon: Secondary | ICD-10-CM

## 2024-08-08 DIAGNOSIS — R300 Dysuria: Secondary | ICD-10-CM | POA: Insufficient documentation

## 2024-08-08 DIAGNOSIS — Z85038 Personal history of other malignant neoplasm of large intestine: Secondary | ICD-10-CM | POA: Insufficient documentation

## 2024-08-08 DIAGNOSIS — D123 Benign neoplasm of transverse colon: Secondary | ICD-10-CM

## 2024-08-08 DIAGNOSIS — I5020 Unspecified systolic (congestive) heart failure: Secondary | ICD-10-CM

## 2024-08-08 DIAGNOSIS — I252 Old myocardial infarction: Secondary | ICD-10-CM | POA: Insufficient documentation

## 2024-08-08 HISTORY — PX: POLYPECTOMY: SHX149

## 2024-08-08 HISTORY — PX: COLONOSCOPY: SHX5424

## 2024-08-08 SURGERY — COLONOSCOPY
Anesthesia: Monitor Anesthesia Care

## 2024-08-08 MED ORDER — PROPOFOL 500 MG/50ML IV EMUL
INTRAVENOUS | Status: DC | PRN
  Administered 2024-08-08: 180 ug/kg/min via INTRAVENOUS

## 2024-08-08 MED ORDER — SODIUM CHLORIDE 0.9 % IV SOLN: INTRAVENOUS | Status: DC

## 2024-08-08 MED ORDER — PHENYLEPHRINE 80 MCG/ML (10ML) SYRINGE FOR IV PUSH (FOR BLOOD PRESSURE SUPPORT)
PREFILLED_SYRINGE | INTRAVENOUS | Status: DC | PRN
  Administered 2024-08-08 (×2): 80 ug via INTRAVENOUS

## 2024-08-08 MED ORDER — SODIUM CHLORIDE 0.9 % IV SOLN
INTRAVENOUS | Status: AC | PRN
  Administered 2024-08-08: 500 mL via INTRAMUSCULAR

## 2024-08-08 MED ORDER — FLEET ENEMA RE ENEM
ENEMA | RECTAL | Status: AC
  Filled 2024-08-08: qty 1

## 2024-08-08 MED ORDER — PROPOFOL 10 MG/ML IV BOLUS
INTRAVENOUS | Status: DC | PRN
  Administered 2024-08-08: 20 mg via INTRAVENOUS

## 2024-08-08 MED ORDER — FLEET ENEMA RE ENEM
1.0000 | ENEMA | Freq: Once | RECTAL | Status: AC
  Administered 2024-08-08: 1 via RECTAL

## 2024-08-08 MED ORDER — PROPOFOL 1000 MG/100ML IV EMUL
INTRAVENOUS | Status: AC
  Filled 2024-08-08: qty 100

## 2024-08-08 MED ORDER — PROPOFOL 10 MG/ML IV BOLUS
INTRAVENOUS | Status: AC
  Filled 2024-08-08: qty 20

## 2024-08-08 MED ORDER — LIDOCAINE 2% (20 MG/ML) 5 ML SYRINGE
INTRAMUSCULAR | Status: DC | PRN
  Administered 2024-08-08: 40 mg via INTRAVENOUS

## 2024-08-08 NOTE — Transfer of Care (Signed)
 Immediate Anesthesia Transfer of Care Note  Patient: [name] [name].  Procedure(s) Performed: COLONOSCOPY POLYPECTOMY, INTESTINE  Patient Location: Endoscopy Unit  Anesthesia Type:MAC  Level of Consciousness: sedated  Airway & Oxygen Therapy: Patient Spontanous Breathing and Patient connected to nasal cannula oxygen  Post-op Assessment: Report given to RN and Post -op Vital signs reviewed and stable  Post vital signs: Reviewed and stable  Last Vitals:  Vitals Value Taken Time  BP    Temp    Pulse    Resp    SpO2      Last Pain:  Vitals:   08/08/24 0808  TempSrc: Temporal  PainSc: 0-No pain         Complications: No notable events documented.

## 2024-08-08 NOTE — H&P (Signed)
 06/23/2024 [name] [name] [id number] [birth year]   Referring provider: Severa Rock HERO, FNP Primary GI doctor: Dr. Charlanne   ASSESSMENT AND PLAN:  Sigmoid colon cancer stage Iib Status post sigmoidectomy and end colostomy 02/01/2024 3 cycles of capecitabine , 05/23/2024 radiation will complete 10/17  Is wishing for reversal of colostomy and needs preop colonoscopy Ideally will wait a few a few months prior to colonoscopy, scheduled for Nov 24, will schedule at the hospital with permission to hold pradaxa  2 days We have discussed the risks of bleeding, infection, perforation, medication reactions, and remote risk of death associated with colonoscopy. All questions were answered and the patient acknowledges these risk and wishes to proceed.   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 male with a past medical history listed below presents for evaluation of colonoscopy in the hospital prior to reversal.    Discussed the use of AI scribe software for clinical note transcription with the patient, who gave verbal consent to proceed.    History of Present Illness   [name] Barradas. is a 78 year old male with sigmoid colon cancer status post resection who presents for follow-up regarding urinary symptoms and colostomy reversal.   He has a history of sigmoid colon cancer and underwent resection in May 2025. He has completed three cycles of chemotherapy and is nearing the end of his radiation treatment, scheduled to be completed on July 01, 2024. He declined a 'double dose' of radiation due to concerns about his body's tolerance.   He experiences burning during urination, which has worsened over the past week, causing nocturia four to five times a night. He reports hesitancy and burning as significant symptoms. No rectal bleeding, hematuria, fevers, chills, pubic pain, or back pain. He stays hydrated and drinks a lot of fluids.   He has a colostomy and is eager for reversal to improve his quality of life, particularly for activities like hunting. A colonoscopy is required before the reversal, and he has not had one before.   He denies swelling in his legs, fluid overload, chest pain, or significant respiratory symptoms. He has a history of heart issues, including a left ventricular thrombus, and is on Pradaxa . He recently had an echocardiogram, with results pending. No family history of colon cancer.       He  reports that he has quit smoking. His smoking use included cigarettes. He has been exposed to tobacco smoke. He has never used smokeless tobacco. He reports current alcohol use of about 21.0 standard drinks of alcohol per week. He reports that he does not use drugs.   RELEVANT  GI HISTORY, IMAGING AND LABS: Results   DIAGNOSTIC Echocardiogram: Early left ventricular thrombus (06/22/2024)       CBC Labs (Brief)          Component Value Date/Time    WBC 5.2 06/16/2024 1046    WBC 8.1 02/09/2024 0538    RBC 3.85 (L) 06/16/2024 1046    HGB 12.8 (L) 06/16/2024 1046    HGB 12.1 (L) 02/24/2024 1214    HCT 37.4 (L)  06/16/2024 1046    HCT 38.0 02/24/2024 1214    PLT 189 06/16/2024 1046    PLT 235 02/24/2024 1214    MCV 97.1 06/16/2024 1046    MCV 90 02/24/2024 1214    MCH 33.2 06/16/2024 1046    MCHC 34.2 06/16/2024 1046    RDW 16.0 (H) 06/16/2024 1046    RDW 14.3 02/24/2024 1214    LYMPHSABS 0.5 (L) 06/16/2024 1046    LYMPHSABS 1.2 02/24/2024 1214    MONOABS 0.6 06/16/2024 1046    EOSABS 0.4 06/16/2024 1046    EOSABS 0.3 02/24/2024 1214    BASOSABS 0.0 06/16/2024 1046    BASOSABS 0.0 02/24/2024 1214      Recent Labs (within last 365 days)              Recent Labs    02/06/24 0437 02/07/24 0545 02/09/24 0538 02/24/24 1214 03/07/24 1350 03/29/24 1016 04/19/24 1044 05/11/24 1159 06/02/24 1035 06/16/24 1046  HGB 12.9* 12.1* 12.7* 12.1* 12.1* 12.9* 13.7 13.7 12.6* 12.8*        CMP     Labs (Brief)          Component Value Date/Time    NA 138 06/16/2024 1046    NA 139 02/24/2024 1214    K 4.2 06/16/2024 1046    CL 102 06/16/2024 1046    CO2 25 06/16/2024 1046    GLUCOSE 92 06/16/2024 1046    BUN 11 06/16/2024 1046    BUN 17 02/24/2024 1214    CREATININE 0.63 06/16/2024 1046    CREATININE 0.93 03/29/2013 0923    CALCIUM  9.7 06/16/2024 1046    PROT 6.7 06/16/2024 1046    PROT 5.6 (L) 02/24/2024 1214    ALBUMIN 3.8 06/16/2024 1046    ALBUMIN 3.7 (L) 02/24/2024 1214    AST 22 06/16/2024 1046    ALT 24 06/16/2024 1046    ALKPHOS 72 06/16/2024 1046    BILITOT 0.7 06/16/2024 1046    GFRNONAA >60 06/16/2024 1046    GFRNONAA 85 03/29/2013 0923    GFRAA 104 05/01/2020 1031    GFRAA >89 03/29/2013 0923          Latest Ref Rng & Units 06/16/2024   10:46 AM 06/02/2024   10:35 AM 05/11/2024   11:59 AM  Hepatic Function  Total Protein 6.5 - 8.1 g/dL 6.7  6.2  6.7   Albumin 3.5 - 5.0 g/dL 3.8  4.1  4.4   AST 15 - 41 U/L 22  33  46   ALT 0 - 44 U/L 24  35  56   Alk Phosphatase 38 - 126 U/L 72  63  73   Total Bilirubin 0.0 - 1.2 mg/dL 0.7  0.9  1.2       Current  Medications:      Current Outpatient Medications (Cardiovascular):    losartan  (COZAAR ) 25 MG tablet, Take 0.5 tablets (12.5 mg total) by mouth daily.   metoprolol  tartrate (LOPRESSOR ) 25 MG tablet, Take 0.5 tablets (12.5  mg total) by mouth 2 (two) times daily.   nitroGLYCERIN  (NITROSTAT ) 0.4 MG SL tablet, Place 1 tablet (0.4 mg total) under the tongue every 5 (five) minutes as needed for chest pain.   rosuvastatin  (CRESTOR ) 20 MG tablet, Take 1 tablet (20 mg total) by mouth daily.     Current Outpatient Medications (Analgesics):    oxyCODONE  (OXY IR/ROXICODONE ) 5 MG immediate release tablet, Take 1-2 tablets (5-10 mg total) by mouth every 6 (six) hours as needed for severe pain (pain score 7-10).   Current Outpatient Medications (Hematological):    dabigatran  (PRADAXA ) 150 MG CAPS capsule, Take 1 capsule (150 mg total) by mouth 2 (two) times daily.   Current Outpatient Medications (Other):    capecitabine  (XELODA ) 500 MG tablet, Take 1000 mg AM, Take 1000 mg PM, total dose 2000 mg daily, take on days of radiation only. # 8 pills should complete therapy while on radiation (#30 treatments)   cholecalciferol  (VITAMIN D3) 25 MCG (1000 UNIT) tablet, Take 1,000 Units by mouth daily.    ketoconazole (NIZORAL) 2 % cream, Apply 1 Application topically 2 (two) times daily.   ondansetron  (ZOFRAN ) 8 MG tablet, Take 1 tablet (8 mg total) by mouth every 8 (eight) hours as needed for nausea.   polyethylene glycol powder (GLYCOLAX /MIRALAX ) 17 GM/SCOOP powder, Take 17 g by mouth daily.   Medical History:      Past Medical History:  Diagnosis Date   Cancer [facility])     Coronary artery disease     Erectile dysfunction     HFrEF (heart failure with reduced ejection fraction) (HCC)      Echocardiogram 07/14/21:  EF 25, global HK, inf HK worse, Gr 2 DD, normal RVSF, mild to mod MR, AV sclerosis w/o AS Echocardiogram 2/23: EF 25-30, global HK, GLS -12.9, normal RVSF, mild to mod MR, AV sclerosis w/o AS     History of hiatal hernia      a lone time ago   Hyperlipidemia     [place] fracture [facility]) 03/22/2020   Myocardial infarction (HCC)      x 2   Polio          Allergies:  Allergies       Allergies  Allergen Reactions   Brilinta  [Ticagrelor ] Shortness Of Breath   Carvedilol  Diarrhea   Lipitor [Atorvastatin] Other (See Comments)      Makes pt feel funky   Plavix  [Clopidogrel ] Rash        Surgical History:  He  has a past surgical history that includes Coronary stent placement; Appendectomy; Cardiac catheterization; Cervical spine surgery; forein body removal; LEFT HEART CATH AND CORONARY ANGIOGRAPHY (N/A, 11/15/2018); ORIF mandibular fracture (N/A, 03/14/2020); Mandibular hardware removal (Bilateral, 04/23/2020); Scar revision (N/A, 04/23/2020); LEFT HEART CATH AND CORONARY ANGIOGRAPHY (N/A, 07/15/2021); CORONARY STENT INTERVENTION (N/A, 07/15/2021); laparotomy (N/A, 02/01/2024); Colostomy (Left, 02/01/2024); and Partial colectomy (N/A, 02/01/2024). Family History:  His family history includes Diabetes in his mother; Heart disease in his father and mother.   REVIEW OF SYSTEMS  : All other systems reviewed and negative except where noted in the History of Present Illness.   PHYSICAL EXAM: BP 118/70   Pulse 74   Ht 5' 6 (1.676 m)   Wt 139 lb (63 kg)   SpO2 90%   BMI 22.44 kg/m  Physical Exam   GENERAL APPEARANCE: Well nourished, in no apparent distress. HEENT: No cervical lymphadenopathy, unremarkable thyroid , sclerae anicteric, conjunctiva pink. RESPIRATORY: Respiratory effort normal, breath sounds equal bilaterally without  rales, rhonchi, or wheezing. CARDIO: Regular rate and rhythm with no murmurs, rubs, or gallops, peripheral pulses intact. ABDOMEN: Soft, non-distended, active bowel sounds in all four quadrants, no tenderness to palpation, no rebound, no mass appreciated. Has ostomy with light brown stool RECTAL: Rectal exam normal. MUSCULOSKELETAL: Full range of motion, normal  gait, without edema. SKIN: Dry, intact without rashes or lesions. No jaundice. NEURO: Alert, oriented, no focal deficits. PSYCH: Cooperative, normal mood and affect.       [name] JONELLE Coombs, PA-C   Attending physician's note   I have taken history, reviewed the chart and examined the patient. I performed a substantive portion of this encounter, including complete performance of at least one of the key components, in conjunction with the APP. I agree with the Advanced Practitioner's note, impression and recommendations.   For colon thru ostomy and Mucous fistula  Anselm Bring, MD [facility] [phone]

## 2024-08-08 NOTE — Discharge Instructions (Signed)

## 2024-08-08 NOTE — Op Note (Signed)
 [facility] Patient Name: [name] [name] Procedure Date: 08/08/2024 MRN: [id number] Attending MD: Lynnie Bring , MD, [id number] Date of Birth: Apr 18, 1946 CSN: [id number] Age: 78 Admit Type: Outpatient Procedure:                Colonoscopy Indications:              High risk colon cancer surveillance: Sigmoid colon                            cancer stage Iib Status post sigmoidectomy and end                            colostomy 02/01/2024 Providers:                Lynnie Bring, MD, Willy Hummer, RN, Farris Southgate,                            Technician Referring MD:              Medicines:                Monitored Anesthesia Care Complications:            No immediate complications. Estimated Blood Loss:     Estimated blood loss: none. Procedure:                Pre-Anesthesia Assessment:                           - Prior to the procedure, a History and Physical                            was performed, and patient medications and                            allergies were reviewed. The patient's tolerance of                            previous anesthesia was also reviewed. The risks                            and benefits of the procedure and the sedation                            options and risks were discussed with the patient.                            All questions were answered, and informed consent                            was obtained. Prior Anticoagulants: The patient has                            taken Pradaxa  (dabigatran ), last dose was 2 days                            prior  to procedure. ASA Grade Assessment: IV - A                            patient with severe systemic disease that is a                            constant threat to life. After reviewing the risks                            and benefits, the patient was deemed in                            satisfactory condition to undergo the procedure.                           After obtaining  informed consent, the colonoscope                            was passed under direct vision. Throughout the                            procedure, the patient's blood pressure, pulse, and                            oxygen saturations were monitored continuously. The                            [id number]-[id number] ([id number]) Olympus colonscope was                            introduced through the descending colostomy and                            advanced to the the cecum, identified by                            appendiceal orifice and ileocecal valve. The                            colonoscopy was performed without difficulty. The                            patient tolerated the procedure well. The quality                            of the bowel preparation was fair. There was some                            retained stool in several areas of the colon making                            it somewhat harder to visualize. The ileocecal  valve, appendiceal orifice were photographed.                           Thereafter the scope was inserted into the mucous                            fistula. Fleets enemas were given prior. Scope In: 9:46:32 AM Scope Out: 10:14:12 AM Scope Withdrawal Time: 0 hours 25 minutes 7 seconds  Total Procedure Duration: 0 hours 27 minutes 40 seconds  Findings:      Two sessile polyps were found in the distal transverse colon. The polyps       were 4 to 6 mm in size. These polyps were removed with a cold snare.       Resection and retrieval were complete.      FS thru mucous fistula:      10 cm length of rectum. A 12 mm polyp was found in the mid rectum. The       polyp was sessile. The polyp was removed with a piecemeal technique       using a hot snare. Resection and retrieval were complete. Mild diversion       colitis.      The exam was otherwise without abnormality. Impression:               - Two 4 to 6 mm polyps in the distal transverse                             colon, removed with a cold snare. Resected and                            retrieved.                           - One 12 mm polyp in the mid rectum, removed                            piecemeal using a cold snare. Resected and                            retrieved.                           - The examination was otherwise normal. Moderate Sedation:      none Recommendation:           - Patient has a contact number available for                            emergencies. The signs and symptoms of potential                            delayed complications were discussed with the                            patient. Return to normal activities tomorrow.  Written discharge instructions were provided to the                            patient.                           - Resume previous diet.                           - Continue present medications.                           - Await pathology results.                           - Resume Pradaxa  (dabigatran ) at prior dose in 3                            days.                           - FU with surgery for consideration of colostomy                            takedown as per patient's wishes.                           - The findings and recommendations were discussed                            with the patient's family. Procedure Code(s):        --- Professional ---                           [phone], Colonoscopy, flexible; with removal of                            tumor(s), polyp(s), or other lesion(s) by snare                            technique Diagnosis Code(s):        --- Professional ---                           S14.961, Personal history of other malignant                            neoplasm of large intestine                           D12.3, Benign neoplasm of transverse colon (hepatic                            flexure or splenic flexure)                           D12.8, Benign neoplasm of  rectum CPT copyright 2022 American Medical  Association. All rights reserved. The codes documented in this report are preliminary and upon coder review may  be revised to meet current compliance requirements. Lynnie Bring, MD 08/08/2024 10:26:09 AM This report has been signed electronically. Number of Addenda: 0

## 2024-08-08 NOTE — Anesthesia Preprocedure Evaluation (Addendum)
 Anesthesia Evaluation  Patient identified by MRN, date of birth, ID band Patient awake    Reviewed: Allergy & Precautions, NPO status , Patient's Chart, lab work & pertinent test results  History of Anesthesia Complications Negative for: history of anesthetic complications  Airway Mallampati: II  TM Distance: >3 FB Neck ROM: Full    Dental  (+) Edentulous Upper, Edentulous Lower   Pulmonary former smoker   Pulmonary exam normal breath sounds clear to auscultation       Cardiovascular hypertension, Pt. on medications + CAD, + Past MI and + Cardiac Stents (2022)  Normal cardiovascular exam Rhythm:Regular Rate:Normal  06/2024: IMPRESSIONS     1. LV function largely unchanged. No evidence of LV thrombus. Left  ventricular ejection fraction, by estimation, is 25 to 30%. The left  ventricle has severely decreased function. The left ventricle demonstrates  global hypokinesis.   2. The right ventricular size is normal.   3. The mitral valve is normal in structure. No evidence of mitral valve  regurgitation.   4. The aortic valve is tricuspid. Aortic valve regurgitation is not  visualized.     Neuro/Psych neg Seizures negative neurological ROS     GI/Hepatic Neg liver ROS, hiatal hernia,,,cancer of sigmoid Colostomy    Endo/Other  negative endocrine ROS    Renal/GU negative Renal ROS     Musculoskeletal negative musculoskeletal ROS (+)    Abdominal   Peds  Hematology  (+) Blood dyscrasia (Hgb 11.5), anemia   Anesthesia Other Findings Day of surgery medications reviewed with patient.  Reproductive/Obstetrics                              Anesthesia Physical Anesthesia Plan  ASA: 4  Anesthesia Plan: MAC   Post-op Pain Management:    Induction: Intravenous  PONV Risk Score and Plan: 1 and Ondansetron , TIVA and Treatment may vary due to age or medical condition  Airway Management  Planned: Natural Airway and Simple Face Mask  Additional Equipment:   Intra-op Plan:   Post-operative Plan: Extubation in OR  Informed Consent: I have reviewed the patients History and Physical, chart, labs and discussed the procedure including the risks, benefits and alternatives for the proposed anesthesia with the patient or authorized representative who has indicated his/her understanding and acceptance.     Dental advisory given  Plan Discussed with: CRNA  Anesthesia Plan Comments:          Anesthesia Quick Evaluation

## 2024-08-08 NOTE — Anesthesia Postprocedure Evaluation (Signed)
 Anesthesia Post Note  Patient: [name] [name].  Procedure(s) Performed: COLONOSCOPY POLYPECTOMY, INTESTINE     Patient location during evaluation: PACU Anesthesia Type: MAC Level of consciousness: awake and alert Pain management: pain level controlled Vital Signs Assessment: post-procedure vital signs reviewed and stable Respiratory status: spontaneous breathing, nonlabored ventilation, respiratory function stable and patient connected to nasal cannula oxygen Cardiovascular status: stable and blood pressure returned to baseline Postop Assessment: no apparent nausea or vomiting Anesthetic complications: no   No notable events documented.  Last Vitals:  Vitals:   08/08/24 1040 08/08/24 1050  BP: (!) 134/93 (!) 149/77  Pulse: 71 72  Resp: (!) 22 12  Temp:    SpO2: 95% 97%    Last Pain:  Vitals:   08/08/24 1050  TempSrc:   PainSc: 0-No pain                 Thom JONELLE Peoples

## 2024-08-09 LAB — SURGICAL PATHOLOGY

## 2024-08-10 ENCOUNTER — Other Ambulatory Visit: Payer: Self-pay | Admitting: *Deleted

## 2024-08-10 ENCOUNTER — Encounter (HOSPITAL_COMMUNITY): Payer: Self-pay | Admitting: Gastroenterology

## 2024-08-10 ENCOUNTER — Telehealth: Payer: Self-pay | Admitting: Family Medicine

## 2024-08-10 DIAGNOSIS — I2510 Atherosclerotic heart disease of native coronary artery without angina pectoris: Secondary | ICD-10-CM | POA: Diagnosis not present

## 2024-08-10 DIAGNOSIS — I110 Hypertensive heart disease with heart failure: Secondary | ICD-10-CM | POA: Diagnosis not present

## 2024-08-10 DIAGNOSIS — Z6823 Body mass index (BMI) 23.0-23.9, adult: Secondary | ICD-10-CM | POA: Diagnosis not present

## 2024-08-10 DIAGNOSIS — K5790 Diverticulosis of intestine, part unspecified, without perforation or abscess without bleeding: Secondary | ICD-10-CM | POA: Diagnosis not present

## 2024-08-10 DIAGNOSIS — N529 Male erectile dysfunction, unspecified: Secondary | ICD-10-CM | POA: Diagnosis not present

## 2024-08-10 DIAGNOSIS — E782 Mixed hyperlipidemia: Secondary | ICD-10-CM | POA: Diagnosis not present

## 2024-08-10 DIAGNOSIS — I739 Peripheral vascular disease, unspecified: Secondary | ICD-10-CM | POA: Diagnosis not present

## 2024-08-10 DIAGNOSIS — C189 Malignant neoplasm of colon, unspecified: Secondary | ICD-10-CM | POA: Diagnosis not present

## 2024-08-10 DIAGNOSIS — D630 Anemia in neoplastic disease: Secondary | ICD-10-CM | POA: Diagnosis not present

## 2024-08-10 DIAGNOSIS — Z7901 Long term (current) use of anticoagulants: Secondary | ICD-10-CM | POA: Diagnosis not present

## 2024-08-10 DIAGNOSIS — I252 Old myocardial infarction: Secondary | ICD-10-CM | POA: Diagnosis not present

## 2024-08-10 DIAGNOSIS — Z87891 Personal history of nicotine dependence: Secondary | ICD-10-CM | POA: Diagnosis not present

## 2024-08-10 DIAGNOSIS — I5022 Chronic systolic (congestive) heart failure: Secondary | ICD-10-CM | POA: Diagnosis not present

## 2024-08-10 DIAGNOSIS — Z433 Encounter for attention to colostomy: Secondary | ICD-10-CM | POA: Diagnosis not present

## 2024-08-10 DIAGNOSIS — Z48815 Encounter for surgical aftercare following surgery on the digestive system: Secondary | ICD-10-CM | POA: Diagnosis not present

## 2024-08-10 DIAGNOSIS — Z955 Presence of coronary angioplasty implant and graft: Secondary | ICD-10-CM | POA: Diagnosis not present

## 2024-08-10 NOTE — Patient Instructions (Signed)
 Visit Information  Thank you for taking time to visit with me today. Please don't hesitate to contact me if I can be of assistance to you before our next scheduled appointment.  Your next care management appointment is by telephone on 08-24-2024 at 1:30 pm  Telephone follow-up in 2 weeks  Please call the care guide team at [phone] if you need to cancel, schedule, or reschedule an appointment.   Please call the Suicide and Crisis Lifeline: 988 call the [place]  National Suicide Prevention Lifeline: (432) 560-6471 or TTY: 319-731-4741 TTY 3137650058) to talk to a trained counselor call 1-800-273-TALK (toll free, 24 hour hotline) if you are experiencing a Mental Health or Behavioral Health Crisis or need someone to talk to.  Rosina Forte, BSN RN [facility], [facility] RN Care Manager Direct Dial: [phone]  Fax: [phone]

## 2024-08-10 NOTE — Patient Outreach (Signed)
 Complex Care Management   Visit Note  08/10/2024  Name:  [name] [name]. 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  Visit completed with Patient  on the phone  Background:   Past Medical History:  Diagnosis Date   Cancer [facility])    Coronary artery disease    Erectile dysfunction    HFrEF (heart failure with reduced ejection fraction) (HCC)    Echocardiogram 07/14/21:  EF 25, global HK, inf HK worse, Gr 2 DD, normal RVSF, mild to mod MR, AV sclerosis w/o AS Echocardiogram 2/23: EF 25-30, global HK, GLS -12.9, normal RVSF, mild to mod MR, AV sclerosis w/o AS    History of hiatal hernia    a lone time ago   Hyperlipidemia    [place] fracture [facility]) 03/22/2020   Myocardial infarction (HCC)    x 2   Polio     Assessment: Patient Reported Symptoms:  Cognitive Cognitive Status: No symptoms reported Cognitive/Intellectual Conditions Management [RPT]: None reported or documented in medical history or problem list   Health Maintenance Behaviors: Annual physical exam Healing Pattern: Average Health Facilitated by: Rest  Neurological Neurological Review of Symptoms: No symptoms reported Neurological Management Strategies: Routine screening Neurological Self-Management Outcome: 4 (good)  HEENT HEENT Symptoms Reported: No symptoms reported HEENT Management Strategies: Routine screening HEENT Self-Management Outcome: 4 (good)    Cardiovascular Cardiovascular Symptoms Reported: No symptoms reported Does patient have uncontrolled Hypertension?: No Cardiovascular Management Strategies: Routine screening Cardiovascular Self-Management Outcome: 4 (good)  Respiratory Respiratory Symptoms Reported: No symptoms reported Respiratory Management Strategies: Routine screening Respiratory Self-Management Outcome: 4 (good)  Endocrine Endocrine Symptoms Reported: No symptoms  reported Is patient diabetic?: No Endocrine Self-Management Outcome: 4 (good)  Gastrointestinal Gastrointestinal Symptoms Reported: No symptoms reported Gastrointestinal Management Strategies: Colostomy Gastrointestinal Self-Management Outcome: 4 (good)    Genitourinary Genitourinary Symptoms Reported: Pain/burning with urination Additional Genitourinary Details: reports improvement Genitourinary Self-Management Outcome: 4 (good)  Integumentary Integumentary Symptoms Reported: No symptoms reported Skin Self-Management Outcome: 4 (good)  Musculoskeletal Musculoskelatal Symptoms Reviewed: Weakness Musculoskeletal Management Strategies: Routine screening Musculoskeletal Self-Management Outcome: 4 (good) Falls in the past year?: No Number of falls in past year: 1 or less Was there an injury with Fall?: No Fall Risk Category Calculator: 0 Patient Fall Risk Level: Low Fall Risk Patient at Risk for Falls Due to: No Fall Risks Fall risk Follow up: Falls evaluation completed  Psychosocial Psychosocial Symptoms Reported: No symptoms reported Behavioral Management Strategies: Coping strategies Behavioral Health Self-Management Outcome: 4 (good) Major Change/Loss/Stressor/Fears (CP): Medical condition, self Techniques to Cope with Loss/Stress/Change: Diversional activities Quality of Family Relationships: helpful, involved, supportive Do you feel physically threatened by others?: No    08/10/2024    PHQ2-9 Depression Screening   Little interest or pleasure in doing things Not at all  Feeling down, depressed, or hopeless Not at all  PHQ-2 - Total Score 0  Trouble falling or staying asleep, or sleeping too much    Feeling tired or having little energy    Poor appetite or overeating     Feeling bad about yourself - or that you are a failure or have let yourself or your family down    Trouble concentrating on things, such as reading the newspaper or watching television    Moving or speaking  so slowly that other people could have noticed.  Or the opposite - being so fidgety or restless  that you have been moving around a lot more than usual    Thoughts that you would be better off dead, or hurting yourself in some way    PHQ2-9 Total Score    If you checked off any problems, how difficult have these problems made it for you to do your work, take care of things at home, or get along with other people    Depression Interventions/Treatment      There were no vitals filed for this visit. Pain Score: 0-No pain  Medications Reviewed Today     Reviewed by Bertrum Rosina HERO, RN (Registered Nurse) on 08/10/24 at 1021  Med List Status: <None>   Medication Order Taking? Sig Documenting Provider Last Dose Status Informant  cholecalciferol  (VITAMIN D3) 25 MCG (1000 UNIT) tablet [id number] Yes Take 1,000 Units by mouth daily.  [provider]  Active Self, Pharmacy Records  dabigatran  (PRADAXA ) 150 MG CAPS capsule [id number] Yes Take 1 capsule (150 mg total) by mouth 2 (two) times daily. Please use good rx for $55 Severa Rock HERO, FNP  Active   ketoconazole (NIZORAL) 2 % cream [id number] Yes Apply 1 Application topically 2 (two) times daily. [provider]  Active Self, Pharmacy Records  losartan  (COZAAR ) 25 MG tablet [id number] Yes Take 0.5 tablets (12.5 mg total) by mouth daily. Lelon Hamilton T, PA-C  Active   metoprolol  tartrate (LOPRESSOR ) 25 MG tablet [id number] Yes Take 0.5 tablets (12.5 mg total) by mouth 2 (two) times daily. Lelon Hamilton T, PA-C  Active   nitroGLYCERIN  (NITROSTAT ) 0.4 MG SL tablet [id number] Yes Place 1 tablet (0.4 mg total) under the tongue every 5 (five) minutes as needed for chest pain. Merlynn Niki FALCON, FNP  Active Self, Pharmacy Records           Med Note SOUNDRA, BOBETTA PARAS   Wed Jul 13, 2024 11:16 AM) Need refill  oxyCODONE  (OXY IR/ROXICODONE ) 5 MG immediate release tablet [id number]  Take 1-2 tablets (5-10 mg total) by mouth every 6 (six) hours as  needed for severe pain (pain score 7-10).  Patient not taking: Reported on 08/10/2024   Verdene Purchase, MD  Active            Med Note SOUNDRA, BOBETTA PARAS [place] Jul 13, 2024 11:16 AM) Has not used it  rosuvastatin  (CRESTOR ) 20 MG tablet [id number] Yes Take 1 tablet (20 mg total) by mouth daily. Lelon Hamilton T, PA-C  Active   spironolactone  (ALDACTONE ) 25 MG tablet [id number] Yes Take 0.5 tablets (12.5 mg total) by mouth daily. Lelon Hamilton DASEN, PA-C  Active   Med List Note Teretha Renaee SAILOR, RPH-CPP 03/08/24 9079): Xeloda  filled at [facility] (Specialty)            Recommendation:   Continue Current Plan of Care  Follow Up Plan:   Telephone follow-up in 2 weeks  Rosina Bertrum, BSN RN [facility], [facility] RN Care Manager Direct Dial: [phone]  Fax: [phone]

## 2024-08-10 NOTE — Telephone Encounter (Signed)
 Copied from CRM #[id number]. Topic: Clinical - Home Health Verbal Orders >> Aug 10, 2024  2:41 PM Travis FALCON wrote: Caller/Agency: [facility]  Callback Number: [phone]  Service Requested: Needing orders to re-certify him. Patient needs someone to come out and assist with colostomy changes once a week  Frequency: 1 week 6  Any new concerns about the patient? No

## 2024-08-15 ENCOUNTER — Ambulatory Visit: Payer: Self-pay | Admitting: Gastroenterology

## 2024-08-15 NOTE — Telephone Encounter (Signed)
 TC back to Jon w/ [facility] VO given to continue nursing

## 2024-08-15 NOTE — Progress Notes (Signed)
 Discussed biopsy results with patient's son Nov 26 (prior to thanksgiving) Discussed biopsy results with the patient today  Bx-  from sessile rectal polyp removed piecemeal is concerning for adenocarcinoma.  Extent and depth cannot be determined  Plan: -CBC, CMP, CEA -MRI pelvis (Rectal Ca protocol) ASAP -Thereafter, likely would need rpt FS/Bx with EUS by Dr Wilhelmenia.  -FU with  Alan Coombs in 2-3 weeks.  Send report to family physician

## 2024-08-16 ENCOUNTER — Other Ambulatory Visit: Payer: Self-pay

## 2024-08-16 ENCOUNTER — Telehealth: Payer: Self-pay | Admitting: Family Medicine

## 2024-08-16 DIAGNOSIS — C187 Malignant neoplasm of sigmoid colon: Secondary | ICD-10-CM

## 2024-08-16 NOTE — Telephone Encounter (Signed)
 Patient aware and verbalizes understanding.

## 2024-08-16 NOTE — Telephone Encounter (Signed)
 Copied from CRM #[id number]. Topic: Clinical - Prescription Issue >> Aug 16, 2024 11:03 AM Marda MATSU wrote: Patient calling to ask for a call back regarding a blood thinner at:   [id number] - [place], [place] - [address] [place] [address] Phone: [phone] Fax: [phone] Hours: Not open 24 hours  Patient said, They are calling (3x) and saying I need to pick it up right now, but I do not recall us  discussing me going on blood thinners.  Please advise.

## 2024-08-18 NOTE — Addendum Note (Signed)
 Addended by: KATHIE BOTTCHER E on: 08/18/2024 07:58 AM   Modules accepted: Orders

## 2024-08-19 ENCOUNTER — Ambulatory Visit (HOSPITAL_COMMUNITY): Admission: RE | Admit: 2024-08-19 | Source: Ambulatory Visit

## 2024-08-20 ENCOUNTER — Encounter (HOSPITAL_COMMUNITY): Payer: Self-pay | Admitting: Radiology

## 2024-08-20 ENCOUNTER — Ambulatory Visit (HOSPITAL_COMMUNITY): Admission: RE | Admit: 2024-08-20 | Discharge: 2024-08-20 | Attending: Gastroenterology | Admitting: Gastroenterology

## 2024-08-20 DIAGNOSIS — C187 Malignant neoplasm of sigmoid colon: Secondary | ICD-10-CM

## 2024-08-22 ENCOUNTER — Telehealth: Payer: Self-pay | Admitting: Physician Assistant

## 2024-08-22 NOTE — Telephone Encounter (Signed)
 Patient called stating he is frustrated due to needing to complete his blood test but was cancelled for last week. Patient is requesting to know if he is able to go to his PCP to have labs completed. Please advise, thank you

## 2024-08-22 NOTE — Telephone Encounter (Signed)
 Pt stated that his MRI was rescheduled until Saturday. Pt stated that after he had his MRI that he came over to our office building to have the labs drawn and realized that we were closed.  Pt was notified that our office building is closed on Saturday and Sunday. Pt stated that he will come tomorrow to have labs drawn.  Pt verbalized understanding with all questions answered.

## 2024-08-23 ENCOUNTER — Other Ambulatory Visit (INDEPENDENT_AMBULATORY_CARE_PROVIDER_SITE_OTHER)

## 2024-08-23 DIAGNOSIS — C187 Malignant neoplasm of sigmoid colon: Secondary | ICD-10-CM

## 2024-08-23 LAB — COMPREHENSIVE METABOLIC PANEL WITH GFR
ALT: 27 U/L (ref 0–53)
AST: 26 U/L (ref 0–37)
Albumin: 4.2 g/dL (ref 3.5–5.2)
Alkaline Phosphatase: 68 U/L (ref 39–117)
BUN: 11 mg/dL (ref 6–23)
CO2: 29 meq/L (ref 19–32)
Calcium: 9.6 mg/dL (ref 8.4–10.5)
Chloride: 101 meq/L (ref 96–112)
Creatinine, Ser: 0.89 mg/dL (ref 0.40–1.50)
GFR: 82.02 mL/min
Glucose, Bld: 78 mg/dL (ref 70–99)
Potassium: 4.3 meq/L (ref 3.5–5.1)
Sodium: 138 meq/L (ref 135–145)
Total Bilirubin: 0.5 mg/dL (ref 0.2–1.2)
Total Protein: 7.1 g/dL (ref 6.0–8.3)

## 2024-08-23 LAB — CBC WITH DIFFERENTIAL/PLATELET
Basophils Absolute: 0 K/uL (ref 0.0–0.1)
Basophils Relative: 0.7 % (ref 0.0–3.0)
Eosinophils Absolute: 0.2 K/uL (ref 0.0–0.7)
Eosinophils Relative: 4.5 % (ref 0.0–5.0)
HCT: 42 % (ref 39.0–52.0)
Hemoglobin: 14.4 g/dL (ref 13.0–17.0)
Lymphocytes Relative: 10.4 % — ABNORMAL LOW (ref 12.0–46.0)
Lymphs Abs: 0.5 K/uL — ABNORMAL LOW (ref 0.7–4.0)
MCHC: 34.2 g/dL (ref 30.0–36.0)
MCV: 93.6 fl (ref 78.0–100.0)
Monocytes Absolute: 0.5 K/uL (ref 0.1–1.0)
Monocytes Relative: 9.8 % (ref 3.0–12.0)
Neutro Abs: 3.6 K/uL (ref 1.4–7.7)
Neutrophils Relative %: 74.6 % (ref 43.0–77.0)
Platelets: 192 K/uL (ref 150.0–400.0)
RBC: 4.49 Mil/uL (ref 4.22–5.81)
RDW: 13.1 % (ref 11.5–15.5)
WBC: 4.9 K/uL (ref 4.0–10.5)

## 2024-08-24 ENCOUNTER — Ambulatory Visit: Payer: Self-pay | Admitting: Gastroenterology

## 2024-08-24 ENCOUNTER — Other Ambulatory Visit: Payer: Self-pay | Admitting: *Deleted

## 2024-08-24 LAB — CEA: CEA: 2.9 ng/mL — ABNORMAL HIGH

## 2024-08-24 NOTE — Patient Outreach (Signed)
 Complex Care Management   Visit Note  08/24/2024  Name:  [name] [name]. MRN: [id number] DOB: Mar 04, 1946  Situation: Referral received for Complex Care Management related to Cancer Sigmoid Colon I obtained verbal consent from Patient.  Visit completed with Patient  on the phone  Background:   Past Medical History:  Diagnosis Date   Cancer [facility])    Coronary artery disease    Erectile dysfunction    HFrEF (heart failure with reduced ejection fraction) (HCC)    Echocardiogram 07/14/21:  EF 25, global HK, inf HK worse, Gr 2 DD, normal RVSF, mild to mod MR, AV sclerosis w/o AS Echocardiogram 2/23: EF 25-30, global HK, GLS -12.9, normal RVSF, mild to mod MR, AV sclerosis w/o AS    History of hiatal hernia    a lone time ago   Hyperlipidemia    [place] fracture [facility]) 03/22/2020   Myocardial infarction (HCC)    x 2   Polio     Assessment: Patient Reported Symptoms:  Cognitive Cognitive Status: No symptoms reported      Neurological Neurological Review of Symptoms: No symptoms reported    HEENT HEENT Symptoms Reported: No symptoms reported      Cardiovascular Cardiovascular Symptoms Reported: No symptoms reported    Respiratory Respiratory Symptoms Reported: No symptoms reported    Endocrine Endocrine Symptoms Reported: No symptoms reported Is patient diabetic?: No    Gastrointestinal Gastrointestinal Symptoms Reported: No symptoms reported Gastrointestinal Management Strategies: Colostomy    Genitourinary Genitourinary Symptoms Reported: No symptoms reported    Integumentary Integumentary Symptoms Reported: No symptoms reported    Musculoskeletal Musculoskelatal Symptoms Reviewed: Weakness   Falls in the past year?: No Number of falls in past year: 1 or less Was there an injury with Fall?: No Fall Risk Category Calculator: 0 Patient Fall Risk Level: Low Fall Risk Patient at Risk for Falls Due to: No Fall Risks Fall risk Follow up: Falls evaluation completed   Psychosocial Psychosocial Symptoms Reported: No symptoms reported Behavioral Management Strategies: Coping strategies Behavioral Health Self-Management Outcome: 4 (good) Major Change/Loss/Stressor/Fears (CP): Denies Techniques to Cope with Loss/Stress/Change: Diversional activities      08/24/2024    PHQ2-9 Depression Screening   Little interest or pleasure in doing things Not at all  Feeling down, depressed, or hopeless Not at all  PHQ-2 - Total Score 0  Trouble falling or staying asleep, or sleeping too much    Feeling tired or having little energy    Poor appetite or overeating     Feeling bad about yourself - or that you are a failure or have let yourself or your family down    Trouble concentrating on things, such as reading the newspaper or watching television    Moving or speaking so slowly that other people could have noticed.  Or the opposite - being so fidgety or restless that you have been moving around a lot more than usual    Thoughts that you would be better off dead, or hurting yourself in some way    PHQ2-9 Total Score    If you checked off any problems, how difficult have these problems made it for you to do your work, take care of things at home, or get along with other people    Depression Interventions/Treatment      There were no vitals filed for this visit. Pain Score: 0-No pain  Medications Reviewed Today     Reviewed by Bertrum Rosina HERO, RN (Registered Nurse) on  08/24/24 at 1338  Med List Status: <None>   Medication Order Taking? Sig Documenting Provider Last Dose Status Informant  cholecalciferol  (VITAMIN D3) 25 MCG (1000 UNIT) tablet [id number] Yes Take 1,000 Units by mouth daily.  [provider]  Active Self, Pharmacy Records  dabigatran  (PRADAXA ) 150 MG CAPS capsule [id number] Yes Take 1 capsule (150 mg total) by mouth 2 (two) times daily. Please use good rx for $55 [place] HERO, FNP  Active   ketoconazole (NIZORAL) 2 % cream [id number] Yes  Apply 1 Application topically 2 (two) times daily. [provider]  Active Self, Pharmacy Records  losartan  (COZAAR ) 25 MG tablet [id number] Yes Take 0.5 tablets (12.5 mg total) by mouth daily. Lelon Hamilton T, PA-C  Active   metoprolol  tartrate (LOPRESSOR ) 25 MG tablet [id number] Yes Take 0.5 tablets (12.5 mg total) by mouth 2 (two) times daily. Lelon Hamilton T, PA-C  Active   nitroGLYCERIN  (NITROSTAT ) 0.4 MG SL tablet [id number] Yes Place 1 tablet (0.4 mg total) under the tongue every 5 (five) minutes as needed for chest pain. Merlynn Niki FALCON, FNP  Active Self, Pharmacy Records           Med Note SOUNDRA, BOBETTA PARAS   Wed Jul 13, 2024 11:16 AM) Need refill  oxyCODONE  (OXY IR/ROXICODONE ) 5 MG immediate release tablet [id number]  Take 1-2 tablets (5-10 mg total) by mouth every 6 (six) hours as needed for severe pain (pain score 7-10).  Patient not taking: Reported on 08/24/2024   Verdene Purchase, MD  Active            Med Note SOUNDRA, BOBETTA PARAS [place] Jul 13, 2024 11:16 AM) Has not used it  rosuvastatin  (CRESTOR ) 20 MG tablet [id number] Yes Take 1 tablet (20 mg total) by mouth daily. Lelon Hamilton T, PA-C  Active   spironolactone  (ALDACTONE ) 25 MG tablet [id number] Yes Take 0.5 tablets (12.5 mg total) by mouth daily. Lelon Hamilton DASEN, PA-C  Active   Med List Note Teretha Renaee SAILOR, RPH-CPP 03/08/24 9079): Xeloda  filled at [facility] (Specialty)            Recommendation:   Continue Current Plan of Care  Follow Up Plan:   Telephone follow-up in 1 month  Rosina Forte, BSN RN [facility], [facility] RN Care Manager Direct Dial: [phone]  Fax: [phone]

## 2024-08-24 NOTE — Patient Instructions (Signed)
 Visit Information  Thank you for taking time to visit with me today. Please don't hesitate to contact me if I can be of assistance to you before our next scheduled appointment.  Your next care management appointment is by telephone on 09-21-2024 at 10:30 am  Telephone follow-up in 1 month  Please call the care guide team at [phone] if you need to cancel, schedule, or reschedule an appointment.   Please call the Suicide and Crisis Lifeline: 988 call the [place]  National Suicide Prevention Lifeline: 360-067-8155 or TTY: 863 484 9765 TTY (615)216-2113) to talk to a trained counselor call 1-800-273-TALK (toll free, 24 hour hotline) if you are experiencing a Mental Health or Behavioral Health Crisis or need someone to talk to.  Rosina Forte, BSN RN [facility], [facility] Health RN Care Manager Direct Dial: [phone]  Fax: [phone]

## 2024-08-24 NOTE — Progress Notes (Signed)
 MRI rectum- negative for any obvious mass CEA- 2.9 Send report to family physician Pl let pt know  Likely needs rectal EUS with Bx Will get input from Dr Wilhelmenia.  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

## 2024-08-25 ENCOUNTER — Ambulatory Visit: Admitting: Family Medicine

## 2024-08-26 ENCOUNTER — Telehealth: Payer: Self-pay

## 2024-08-26 ENCOUNTER — Other Ambulatory Visit: Payer: Self-pay

## 2024-08-26 DIAGNOSIS — C187 Malignant neoplasm of sigmoid colon: Secondary | ICD-10-CM

## 2024-08-26 DIAGNOSIS — K621 Rectal polyp: Secondary | ICD-10-CM

## 2024-08-26 NOTE — Telephone Encounter (Signed)
 Lower EUS has been set up for 12/29 at 230 pm at [facility] with GM   Lower EUS scheduled, pt instructed and sent to My Chart and medications reviewed.  Patient instructions mailed to home.  Patient to call with any questions or concerns.

## 2024-08-26 NOTE — Telephone Encounter (Signed)
 ----- Message from Aloha Finner, MD sent at 08/25/2024  5:19 PM EST ----- Regarding: RE: Neg MRI rectum Kanishk Stroebel, Offer this patient Lower EUS on 12/29.  Normal Flex Sigmoidoscopy colon preparation.  Thanks. GM ----- Message ----- From: Charlanne Groom, MD Sent: 08/25/2024  11:49 AM EST To: Aloha Finner Raddle., MD Subject: RE: Neg MRI rectum                             Gabe 12/29 would be awesome Thanks as always for your help RG ----- Message ----- From: Finner Aloha Raddle., MD Sent: 08/25/2024   5:15 AM EST To: Cordella DELENA Idler, MD; Arley KATHEE Hof, MD; # Subject: RE: Neg MRI rectum                             Team, I think repeat FS is reasonable by itself. However, my next available Lower EUS/FS will be 12/29. If the group would like that done, I'll get him on the list. Let me know. Thanks. GM ----- Message ----- From: Hof Arley KATHEE, MD Sent: 08/24/2024   7:48 PM EST To: Cordella DELENA Idler, MD; Aloha Finner Jr# Subject: RE: Neg MRI rectum                             Repeat FS sounds good Thanks ----- Message ----- From: Charlanne Groom, MD Sent: 08/24/2024   7:19 PM EST To: Cordella DELENA Idler, MD; Arley KATHEE Hof, MD; # Subject: Neg MRI rectum                                 Hi Gabe, Neg MRI rectum for obvious rectal mass. CEA- unchanged  ? Suggestions ?EUS with Bx or just rpt FS. Not sure PET would be helpful  RG ----- Message ----- From: Finner Aloha Raddle., MD Sent: 08/21/2024   9:55 AM EST To: Cordella DELENA Idler, MD; Arley KATHEE Hof, MD; # Subject: RE: Abn colon                                  If MRIPelvis is performed and shows this lesion is within the MP then not sure Rectal EUS is going to be necessary and a repeat Flex sigmoidoscopy may be all that is required.  RG, will you be ordering the MRI Pelvis? ----- Message ----- From: Idler Cordella DELENA, MD Sent: 08/19/2024   2:38 PM EST To: Arley KATHEE Hof, MD; Aloha Finner  Jr.,# Subject: RE: Abn colon                                  I will be sure to follow along and will plan to see him back in the office to discuss options for surgery pending the results of the mentioned workup.  Thank you! GM ----- Message ----- From: Hof Arley KATHEE, MD Sent: 08/18/2024   7:55 PM EST To: Cordella DELENA Idler, MD; Aloha Finner Jr# Subject: RE: Hilarie colon  Agree with MRI/repeat biopsy and CEA, will need full CTs or PET if adenocarcinoma confirmed. Let me know the f/u biopsy result and I will see him.  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  Colon 11/24 showed 1.2 cm sessile polyp in mid-rectum (mucus fistula) away from anastomosis, removed piecemeal with hot snare. Bx- highly suspicious for AdenoCa. Has multiple co-morbidities including HFrEF (EF 25%) on pradaxa .  Have ordered labs including CEA MRI rectum  Gabe, Likely will need rectal EUS/FS with rpt Bx. Any thoughts?  Raj   Brad/Greg- FYI. Any thoughts?

## 2024-09-02 ENCOUNTER — Encounter (HOSPITAL_COMMUNITY): Payer: Self-pay | Admitting: Gastroenterology

## 2024-09-02 NOTE — Progress Notes (Unsigned)
 "    09/02/2024 [name] [name] [id number] 11-Sep-1946  Referring provider: Severa Rock HERO, FNP Primary GI doctor: Dr. 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 Charlanne fair prep retained stool 2 polyps 4 to 6 mm transverse colon 12 mm polyp mid rectum piecemeal PATH: Transverse colon polyps tubular adenomatous, rectal 12 mm piecemeal removed polyp fragments of tubular adenoma focally concerning for adenocarcinoma extent and depth of invasion cannot be determined 08/20/2024 MRI pelvis no discrete focal mass seen in the rectum or sigmoid colon no pericolonic fat stranding no pelvic lymphadenopathy 08/23/2024 CEA 2.9, CBC without anemia or leukocytosis, normal kidney liver 09/12/2024 plan for EUS/flex Sigg with Dr. 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 male with a past medical history listed below presents for evaluation of colonoscopy in the hospital prior to reversal.   I last saw the patient in the office 06/23/2024 and regarding possible colostomy reversal for previous sigmoid colon cancer status post sigmoidectomy and end colostomy 02/01/2024 after completing  chemotherapy.  Discussed the use of AI scribe software for clinical note transcription with the patient, who gave verbal consent to proceed.  History of Present Illness          He  reports that he has quit smoking. His smoking use included cigarettes. He has been exposed to tobacco smoke. He has never used smokeless tobacco. He reports current alcohol use of about 21.0 standard drinks of alcohol per week. He reports that he does not use drugs.  RELEVANT GI HISTORY, IMAGING AND LABS: Results          CBC    Component Value Date/Time   WBC 4.9 08/23/2024 1230   RBC 4.49 08/23/2024 1230   HGB 14.4 08/23/2024 1230   HGB 14.1 07/13/2024 0857   HCT 42.0 08/23/2024 1230   HCT 43.1 07/13/2024 0857   PLT 192.0 08/23/2024 1230   PLT 161 07/13/2024 0857   MCV 93.6 08/23/2024 1230   MCV 100 (H) 07/13/2024 0857   MCH 32.6 07/13/2024 0857   MCH 33.2 06/16/2024 1046   MCHC 34.2 08/23/2024 1230   RDW 13.1 08/23/2024 1230   RDW 13.2 07/13/2024 0857   LYMPHSABS 0.5 (L) 08/23/2024 1230   LYMPHSABS 0.5 (L) 07/13/2024 0857   MONOABS 0.5 08/23/2024 1230   EOSABS 0.2 08/23/2024 1230   EOSABS 0.3 07/13/2024 0857   BASOSABS 0.0 08/23/2024 1230   BASOSABS 0.0 07/13/2024 0857   Recent Labs    02/09/24 0538 02/24/24 1214 03/07/24 1350 03/29/24 1016 04/19/24 1044 05/11/24 1159 06/02/24 1035 06/16/24 1046 07/13/24 0857 08/23/24 1230  HGB 12.7* 12.1* 12.1* 12.9* 13.7 13.7 12.6* 12.8* 14.1 14.4    CMP     Component Value Date/Time  NA 138 08/23/2024 1230   NA 134 07/28/2024 1036   K 4.3 08/23/2024 1230   CL 101 08/23/2024 1230   CO2 29 08/23/2024 1230   GLUCOSE 78 08/23/2024 1230   BUN 11 08/23/2024 1230   BUN 10 07/28/2024 1036   CREATININE 0.89 08/23/2024 1230   CREATININE 0.63 06/16/2024 1046   CREATININE 0.93 03/29/2013 0923   CALCIUM  9.6 08/23/2024 1230   PROT 7.1 08/23/2024 1230   PROT 6.5 07/13/2024 0857   ALBUMIN 4.2 08/23/2024 1230   ALBUMIN 4.1 07/13/2024 0857    AST 26 08/23/2024 1230   AST 22 06/16/2024 1046   ALT 27 08/23/2024 1230   ALT 24 06/16/2024 1046   ALKPHOS 68 08/23/2024 1230   BILITOT 0.5 08/23/2024 1230   BILITOT 0.5 07/13/2024 0857   BILITOT 0.7 06/16/2024 1046   GFRNONAA >60 06/16/2024 1046   GFRNONAA 85 03/29/2013 0923   GFRAA 104 05/01/2020 1031   GFRAA >89 03/29/2013 0923      Latest Ref Rng & Units 08/23/2024   12:30 PM 07/13/2024    8:57 AM 06/16/2024   10:46 AM  Hepatic Function  Total Protein 6.0 - 8.3 g/dL 7.1  6.5  6.7   Albumin 3.5 - 5.2 g/dL 4.2  4.1  3.8   AST 0 - 37 U/L 26  28  22    ALT 0 - 53 U/L 27  27  24    Alk Phosphatase 39 - 117 U/L 68  75  72   Total Bilirubin 0.2 - 1.2 mg/dL 0.5  0.5  0.7       Current Medications:   Current Outpatient Medications (Cardiovascular):    losartan  (COZAAR ) 25 MG tablet, Take 0.5 tablets (12.5 mg total) by mouth daily.   metoprolol  tartrate (LOPRESSOR ) 25 MG tablet, Take 0.5 tablets (12.5 mg total) by mouth 2 (two) times daily.   nitroGLYCERIN  (NITROSTAT ) 0.4 MG SL tablet, Place 1 tablet (0.4 mg total) under the tongue every 5 (five) minutes as needed for chest pain.   rosuvastatin  (CRESTOR ) 20 MG tablet, Take 1 tablet (20 mg total) by mouth daily.   spironolactone  (ALDACTONE ) 25 MG tablet, Take 0.5 tablets (12.5 mg total) by mouth daily.  Current Outpatient Medications (Analgesics):    oxyCODONE  (OXY IR/ROXICODONE ) 5 MG immediate release tablet, Take 1-2 tablets (5-10 mg total) by mouth every 6 (six) hours as needed for severe pain (pain score 7-10). (Patient not taking: Reported on 08/24/2024)  Current Outpatient Medications (Hematological):    dabigatran  (PRADAXA ) 150 MG CAPS capsule, Take 1 capsule (150 mg total) by mouth 2 (two) times daily. Please use good rx for $55  Current Outpatient Medications (Other):    cholecalciferol  (VITAMIN D3) 25 MCG (1000 UNIT) tablet, Take 1,000 Units by mouth daily.    ketoconazole (NIZORAL) 2 % cream, Apply 1 Application  topically 2 (two) times daily.  Medical History:  Past Medical History:  Diagnosis Date   Cancer [facility])    Coronary artery disease    Erectile dysfunction    HFrEF (heart failure with reduced ejection fraction) (HCC)    Echocardiogram 07/14/21:  EF 25, global HK, inf HK worse, Gr 2 DD, normal RVSF, mild to mod MR, AV sclerosis w/o AS Echocardiogram 2/23: EF 25-30, global HK, GLS -12.9, normal RVSF, mild to mod MR, AV sclerosis w/o AS    History of hiatal hernia    a lone time ago   Hyperlipidemia    Ladora Appl fracture [facility]) 03/22/2020   Myocardial  infarction (HCC)    x 2   Polio    Allergies:  Allergies  Allergen Reactions   Brilinta  [Ticagrelor ] Shortness Of Breath   Carvedilol  Diarrhea   Lipitor [Atorvastatin] Other (See Comments)    Makes pt feel funky   Plavix  [Clopidogrel ] Rash     Surgical History:  He  has a past surgical history that includes Coronary stent placement; Appendectomy; Cardiac catheterization; Cervical spine surgery; forein body removal; LEFT HEART CATH AND CORONARY ANGIOGRAPHY (N/A, 11/15/2018); ORIF mandibular fracture (N/A, 03/14/2020); Mandibular hardware removal (Bilateral, 04/23/2020); Scar revision (N/A, 04/23/2020); LEFT HEART CATH AND CORONARY ANGIOGRAPHY (N/A, 07/15/2021); CORONARY STENT INTERVENTION (N/A, 07/15/2021); laparotomy (N/A, 02/01/2024); Colostomy (Left, 02/01/2024); Partial colectomy (N/A, 02/01/2024); Colonoscopy (N/A, 08/08/2024); and Polypectomy (08/08/2024). Family History:  His family history includes Diabetes in his mother; Heart disease in his father and mother.  REVIEW OF SYSTEMS  : All other systems reviewed and negative except where noted in the History of Present Illness.  PHYSICAL EXAM: There were no vitals taken for this visit. Physical Exam   GENERAL APPEARANCE: Well nourished, in no apparent distress. HEENT: No cervical lymphadenopathy, unremarkable thyroid , sclerae anicteric, conjunctiva pink. RESPIRATORY: Respiratory  effort normal, breath sounds equal bilaterally without rales, rhonchi, or wheezing. CARDIO: Regular rate and rhythm with no murmurs, rubs, or gallops, peripheral pulses intact. ABDOMEN: Soft, non-distended, active bowel sounds in all four quadrants, no tenderness to palpation, no rebound, no mass appreciated. Has ostomy with light brown stool RECTAL: Rectal exam normal. MUSCULOSKELETAL: Full range of motion, normal gait, without edema. SKIN: Dry, intact without rashes or lesions. No jaundice. NEURO: Alert, oriented, no focal deficits. PSYCH: Cooperative, normal mood and affect.      Alan JONELLE Coombs, PA-C 7:46 AM   "

## 2024-09-05 ENCOUNTER — Telehealth: Payer: Self-pay

## 2024-09-05 ENCOUNTER — Ambulatory Visit: Admitting: Physician Assistant

## 2024-09-05 ENCOUNTER — Encounter: Payer: Self-pay | Admitting: Physician Assistant

## 2024-09-05 ENCOUNTER — Telehealth: Payer: Self-pay | Admitting: Gastroenterology

## 2024-09-05 VITALS — BP 116/58 | HR 68 | Ht 66.0 in | Wt 143.5 lb

## 2024-09-05 DIAGNOSIS — I2510 Atherosclerotic heart disease of native coronary artery without angina pectoris: Secondary | ICD-10-CM

## 2024-09-05 DIAGNOSIS — I5020 Unspecified systolic (congestive) heart failure: Secondary | ICD-10-CM

## 2024-09-05 DIAGNOSIS — J439 Emphysema, unspecified: Secondary | ICD-10-CM

## 2024-09-05 DIAGNOSIS — C187 Malignant neoplasm of sigmoid colon: Secondary | ICD-10-CM

## 2024-09-05 DIAGNOSIS — Z933 Colostomy status: Secondary | ICD-10-CM

## 2024-09-05 DIAGNOSIS — K621 Rectal polyp: Secondary | ICD-10-CM

## 2024-09-05 DIAGNOSIS — Z87891 Personal history of nicotine dependence: Secondary | ICD-10-CM

## 2024-09-05 NOTE — Telephone Encounter (Signed)
 [facility] Pre-operative Risk Assessment     Request for surgical clearance:     Endoscopy Procedure  What type of surgery is being performed?     Flex sig and lower EUS  When is this surgery scheduled?     09-12-24  What type of clearance is required ?   Pharmacy  Are there any medications that need to be held prior to surgery and how long? Pradaxa  x 2 days  Practice name and name of physician performing surgery?      [facility]  What is your office phone and fax number?      Phone- [phone]  Fax- [phone]  Anesthesia type (None, local, MAC, general) ?       MAC   Please route your response to Nat Sic, CMA

## 2024-09-05 NOTE — Telephone Encounter (Signed)
 Helping in preop today, chart reviewed. Patient is on anticoagulation for purposes of LV thrombus - could not afford Eliquis . At last OV 07/13/24, had recently stopped Pradaxa  due to cost, restarted at that visit. Has f/u echo scheduled 10/13/24 [facility], do you feel this needs to be completed prior to holding anticoagulation as requested for GI procedure (EUS/flex sig - follows with GI with hx of sigmoid colon CA). - Please route response to P CV DIV PREOP (the pre-op pool). Thank you.

## 2024-09-05 NOTE — Patient Instructions (Addendum)
 _______________________________________________________  If your blood pressure at your visit was 140/90 or greater, please contact your primary care physician to follow up on this.  _______________________________________________________  If you are age 78 or older, your body mass index should be between 23-30. Your Body mass index is 23.16 kg/m. If this is out of the aforementioned range listed, please consider follow up with your Primary Care Provider.  If you are age 75 or younger, your body mass index should be between 19-25. Your Body mass index is 23.16 kg/m. If this is out of the aformentioned range listed, please consider follow up with your Primary Care Provider.   ________________________________________________________  The [place] providers would like to encourage you to use MYCHART to communicate with providers for non-urgent requests or questions.  Due to long hold times on the telephone, sending your provider a message by [facility] may be a faster and more efficient way to get a response.  Please allow 48 business hours for a response.  Please remember that this is for non-urgent requests.  _______________________________________________________  [facility] is using a team-based approach to care.  Your team is made up of your doctor and two to three APPS. Our APPS (Nurse Practitioners and Physician Assistants) work with your physician to ensure care continuity for you. They are fully qualified to address your health concerns and develop a treatment plan. They communicate directly with your gastroenterologist to care for you. Seeing the Advanced Practice Practitioners on your physician's team can help you by facilitating care more promptly, often allowing for earlier appointments, access to diagnostic testing, procedures, and other specialty referrals.    VISIT SUMMARY:  You came in today to discuss your recent blood test results and colonoscopy findings. We  reviewed your current condition, including the results of your colonoscopy and the next steps for further evaluation.  YOUR PLAN:  RECTAL TUBULAR ADENOMA, POST-POLYPECTOMY: You have a history of rectal tubular adenoma, and recent tests show some abnormal cells but no clear signs of invasive cancer. -We will perform a flexible sigmoidoscopy with endoscopic ultrasound (EUS) and biopsy at the polypectomy site to check for deeper involvement or any remaining abnormal cells. -Please follow the bowel preparation instructions, including using an enema and laxative. -Stop taking Pradaxa  two days before the procedure. -Biopsy results should be available in 3-7 days, but there may be a delay due to the holidays.  NICOTINE DEPENDENCE, CIGARETTES: You have resumed smoking, which can affect your healing and the results of your tests. -It is important to stop smoking before your surgery to help with healing and reduce the risk of complications. -Smoking can cause mild elevation in your CEA levels, which is likely the reason for your recent test results.

## 2024-09-05 NOTE — Progress Notes (Signed)
 Will proceed with scheduled sigmoidoscopy/lower EUS as per Dr. Charlanne and oncologic and surgical team desires. GM

## 2024-09-05 NOTE — Telephone Encounter (Addendum)
 Procedure:Lower EUS Procedure date: 09/12/24 Procedure location: [facility] Arrival Time: 1:00 pm Spoke with the patient Y/N: Yes Any prep concerns? No  Has the patient obtained the prep from the pharmacy ? Yes Do you have a care partner and transportation: Yes Any additional concerns? No

## 2024-09-07 NOTE — Telephone Encounter (Signed)
 Patient aware that cardiology has cleared him to hold Pradaxa  2 days prior to procedures with Dr Wilhelmenia on 09-12-24.  Patient agreed to plan and verbalized understanding.  No further questions or concerns.

## 2024-09-07 NOTE — Telephone Encounter (Signed)
 He does not need the echocardiogram prior to his EGD. He is ok to hold Pradaxa  for the recommended amount of time. Glendia Ferrier, PA-C    09/07/2024 10:24 AM

## 2024-09-07 NOTE — Telephone Encounter (Signed)
 Patient had colonoscopy with Dr Charlanne in November and was cleared to hold Pradaxa .  I sent another clearance for upcoming EUS/flex sig on 09-12-24.  He is on Pradaxa  for LV Thrombus and would technically need to start holding 2 days prior.    Please see correspondance from Dayna Dunn, PA-C to [employer], PA-C.  It looks as if they are both out of the cardiology office until 09-09-24.  I will be out of the office on 09-09-24.  I am just trying to make sure we take the safest measures for this patient.   Please advise.

## 2024-09-07 NOTE — Telephone Encounter (Signed)
" °  ° °  Primary Cardiologist: Arun K Thukkani, MD  Chart reviewed as part of pre-operative protocol coverage. Given past medical history and time since last visit, based on ACC/AHA guidelines, Jireh Elmore. would be at acceptable risk for the planned procedure without further cardiovascular testing.   His Pradaxa  may be held for 2 days prior to his procedure.  Please resume as soon as hemostasis is achieved.  I will route this recommendation to the requesting party via Epic fax function and remove from pre-op pool.  Please call with questions.  Josefa HERO. Bobak Oguinn NP-C     09/07/2024, 10:41 AM [facility] [address] [place], [place] [address] Office [phone]      "

## 2024-09-09 NOTE — Telephone Encounter (Signed)
Agree with plan of action. GM

## 2024-09-09 NOTE — Progress Notes (Signed)
" ° °  PROVIDER:  CORDELLA BEVERLEY IDLER, MD  MRN: [id number] DOB: 1946/06/09 DATE OF ENCOUNTER: 09/09/2024 Interval History:     He completed a course of radiation and 6 weeks of chemotherapy but ultimately had to stop due to side effects. He had a colonoscopy in November that showed 2 polyps in the transverse colon (tubular adenomas) and a rectal polyp (1.2cm, tubular adenoma with concern for focal adenocarcinoma). He had a follow up MRI that did not reveal a focal mass and is scheduled for flex sig with EUS on Monday.  He reports that overall he is doing well. He has been tolerating PO and has a significant appetite. His energy levels have improved and he has gained 5lbs.     Physical Examination:   Physical Exam   Gen: male, NAD Abd: soft, non-distended, midline incision well healed, ostomy pink and healthy with stool in the bag   Assessment and Plan:     Josephanthony Tindel. is a 78 y.o. male who underwent an open sigmoidectomy for presumed diverticulitis. Intra-op there was concern for malignancy and frozen section confirmed adenocarcinoma.  The resection was taken as distal as possible from the abdomen and the rectal stump was oversewn. Final pathology revealed focally positive distal margin with 0/11 positive lymph nodes. He completed chemo/XRT and underwent a colonoscopy that showed a polyp within the rectum that was removed and found to contain a focus of adenocarcinoma. He is scheduled to undergo flex sig with EUS on Monday. We had a long discussion today regarding the possible findings and the potential for reversal. I told him that any plans for surgery and/or additional treatment would be dependent on the results for his EUS and a discussion with a multidisciplinary board. I will continue to follow allow and will plan to see him back in April, but I did tell him today that I will likely refer him to one of my colorectal colleagues for any additional surgeries that may be  indicated.  GREGORY BEVERLEY IDLER, MD   "

## 2024-09-12 ENCOUNTER — Other Ambulatory Visit: Payer: Self-pay

## 2024-09-12 ENCOUNTER — Ambulatory Visit (HOSPITAL_COMMUNITY): Admitting: Certified Registered"

## 2024-09-12 ENCOUNTER — Encounter (HOSPITAL_COMMUNITY): Payer: Self-pay | Admitting: Gastroenterology

## 2024-09-12 ENCOUNTER — Encounter (HOSPITAL_COMMUNITY)
Admission: RE | Disposition: A | Discharge status: Home / Self Care | Payer: Self-pay | Source: Home / Self Care | Attending: Gastroenterology

## 2024-09-12 ENCOUNTER — Ambulatory Visit (HOSPITAL_COMMUNITY)
Admission: RE | Admit: 2024-09-12 | Discharge: 2024-09-12 | Disposition: A | Discharge status: Home / Self Care | Attending: Gastroenterology | Admitting: Gastroenterology

## 2024-09-12 DIAGNOSIS — I252 Old myocardial infarction: Secondary | ICD-10-CM | POA: Insufficient documentation

## 2024-09-12 DIAGNOSIS — I739 Peripheral vascular disease, unspecified: Secondary | ICD-10-CM | POA: Insufficient documentation

## 2024-09-12 DIAGNOSIS — I2510 Atherosclerotic heart disease of native coronary artery without angina pectoris: Secondary | ICD-10-CM | POA: Diagnosis not present

## 2024-09-12 DIAGNOSIS — Z87891 Personal history of nicotine dependence: Secondary | ICD-10-CM | POA: Insufficient documentation

## 2024-09-12 DIAGNOSIS — K640 First degree hemorrhoids: Secondary | ICD-10-CM

## 2024-09-12 DIAGNOSIS — K621 Rectal polyp: Secondary | ICD-10-CM

## 2024-09-12 DIAGNOSIS — K624 Stenosis of anus and rectum: Secondary | ICD-10-CM | POA: Diagnosis not present

## 2024-09-12 DIAGNOSIS — K449 Diaphragmatic hernia without obstruction or gangrene: Secondary | ICD-10-CM | POA: Insufficient documentation

## 2024-09-12 DIAGNOSIS — K626 Ulcer of anus and rectum: Secondary | ICD-10-CM | POA: Insufficient documentation

## 2024-09-12 DIAGNOSIS — I10 Essential (primary) hypertension: Secondary | ICD-10-CM

## 2024-09-12 DIAGNOSIS — K629 Disease of anus and rectum, unspecified: Secondary | ICD-10-CM | POA: Insufficient documentation

## 2024-09-12 DIAGNOSIS — C187 Malignant neoplasm of sigmoid colon: Secondary | ICD-10-CM

## 2024-09-12 HISTORY — PX: FLEXIBLE SIGMOIDOSCOPY: SHX5431

## 2024-09-12 HISTORY — PX: EUS: SHX5427

## 2024-09-12 SURGERY — SIGMOIDOSCOPY, FLEXIBLE
Anesthesia: Monitor Anesthesia Care

## 2024-09-12 MED ORDER — LACTATED RINGERS IV SOLN: INTRAVENOUS | Status: DC | PRN

## 2024-09-12 MED ORDER — VASOPRESSIN 20 UNIT/ML IV SOLN
INTRAVENOUS | Status: AC
  Filled 2024-09-12: qty 1

## 2024-09-12 MED ORDER — PROPOFOL 500 MG/50ML IV EMUL
INTRAVENOUS | Status: DC | PRN
  Administered 2024-09-12: 75 ug/kg/min via INTRAVENOUS

## 2024-09-12 MED ORDER — SODIUM CHLORIDE 0.9 % IV SOLN: INTRAVENOUS | Status: DC

## 2024-09-12 MED ORDER — PHENYLEPHRINE 80 MCG/ML (10ML) SYRINGE FOR IV PUSH (FOR BLOOD PRESSURE SUPPORT)
PREFILLED_SYRINGE | INTRAVENOUS | Status: DC | PRN
  Administered 2024-09-12 (×3): 80 ug via INTRAVENOUS

## 2024-09-12 MED ORDER — LIDOCAINE 2% (20 MG/ML) 5 ML SYRINGE
INTRAMUSCULAR | Status: DC | PRN
  Administered 2024-09-12: 80 mg via INTRAVENOUS

## 2024-09-12 NOTE — Discharge Instructions (Signed)

## 2024-09-12 NOTE — Op Note (Addendum)
 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 Estimated Blood Loss:     Estimated blood loss was minimal. Procedure:                Pre-Anesthesia Assessment:                           - Prior to the procedure, a History and Physical                            was performed, and patient medications and                            allergies were reviewed. The patient's tolerance of                            previous anesthesia was also reviewed. The risks                            and benefits of the procedure and the sedation                            options and risks were discussed with the patient.                            All questions were answered, and informed consent                            was obtained. Prior Anticoagulants: The patient has                            taken Pradaxa  (dabigatran ), last dose was 2 days                            prior to procedure.  ASA Grade Assessment: III - A                            patient with severe systemic disease. After                            reviewing the risks and benefits, the patient was                            deemed in satisfactory condition to undergo the                            procedure.                           After obtaining informed consent, the endoscope  was                            passed under direct vision. Throughout the                            procedure, the patient's blood pressure, pulse, and                            oxygen saturations were monitored continuously. The                            [id number] ([id number]) Olympus endoscope was                            introduced through the anus and advanced to the the                            rectum. The [id number] ([id number]) Olympus                            endosonoscope was introduced through the anus and                            advanced to the the rectum for ultrasound. The                            lower EUS was accomplished without difficulty. The                            patient tolerated the procedure. Scope In: 2:52:15 PM Scope Out: 3:13:30 PM Total Procedure Duration: 0 hours 21 minutes 15 seconds  Findings:      The digital rectal exam findings include anal stenosis and rectal scar       (12 o'clock positioning while in LLD).      ENDOSCOPIC FINDING: :      A suture was found in the proximal rectum stump.      A single (solitary) ten mm ulcer was found in the mid rectum (~8 cm from       anal  os). No bleeding was present. After EUS was completed, biopsies       were taken with a cold forceps for histology.      Normal mucosa was found in the rectum otherwise.      Non-bleeding non-thrombosed internal hemorrhoids were found during       endoscopy. The hemorrhoids were Grade I (internal hemorrhoids that do       not prolapse).      ENDOSONOGRAPHIC FINDING: :      Local wall thickening was found in the rectum. This was encountered from       8 to 9 cm (from the anal verge). The site of thickening was       non-circumferential and located predominantly at the 12-o'clock position       (with respect to the circumference of the rectum). This finding appeared       to be primarily due to increased thickness of the luminal       interface/superficial mucosa  (Layer 1) and deep mucosa (Layer 2). The       thickness of the abnormal layer(s) measured 4.1 mm. The rectal wall       measured up to 6.2 mm in thickness (with normal appearing rectum       measuing 3.2 mm in size).      The rectum was otherwise echoendosonographically normal.      No malignant-appearing lymph nodes were visualized in the perirectal       region. The nodes were.      The perirectal space was normal.      The internal anal sphincter was visualized endosonographically and       appeared normal. 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                           - Suture found in the proximal rectum stump.                           - A single (solitary) ulcer in the mid rectum.                            Biopsied.                           - Normal mucosa in the rest of visualized rectum.                           - Non-bleeding non-thrombosed internal hemorrhoids.                           EUS Impression:                           - Local wall thickening was  visualized                            endosonographically in the rectum. This was due to                            increased thickness of the luminal                            interface/superficial mucosa (Layer 1) and deep                            mucosa (Layer 2). No evidence of MP involvement in                            this region.                           - Endosonographic images of the rectum were                            otherwise unremarkable.                           - No malignant-appearing lymph nodes were                            visualized endosonographically in the perirectal                            region.                           - Endosonographic images of the perirectal space                            were unremarkable.                            - The internal anal sphincter was visualized                            endosonographically and appeared normal. Moderate Sedation:      Not Applicable - Patient had care per Anesthesia. Recommendation:           - The patient will be observed post-procedure,                            until all discharge criteria are met.                           - Discharge patient to home.                           - Patient has a contact number available for  emergencies. The signs and symptoms of potential                            delayed complications were discussed with the                            patient. Return to normal activities tomorrow.                            Written discharge instructions were provided to the                            patient.                           - Resume previous diet.                           - Await path results.                           - Pradaxa  restart on 12/31.                           - Continue present medications otherwise.                           - Depending on pathology findings, will need                            further discussions with Oncology and Surgical                            Oncology. If malignancy is found then consideration                            of TAMIS v ESD v FTRD could be considered. If no                            malignancy is found, consider repeat sigmoidoscopy                            in 2-3 months (allowing time for healing) and                            re-evaluation of the site before consideration of                            ostomy reversal. MDC discussion likely needed after                            pathology returns.                           - The findings and recommendations were discussed  with the patient.                           - The findings and recommendations were discussed                            with the patient's  family. Procedure Code(s):        --- Professional ---                           [id number], Sigmoidoscopy, flexible; with endoscopic                            ultrasound examination                           45331, Sigmoidoscopy, flexible; with biopsy, single                            or multiple Diagnosis Code(s):        --- Professional ---                           K64.0, First degree hemorrhoids                           T18.5XXA, Foreign body in anus and rectum, initial                            encounter                           K62.6, Ulcer of anus and rectum                           K62.89, Other specified diseases of anus and rectum                           I89.9, Noninfective disorder of lymphatic vessels                            and lymph nodes, unspecified CPT copyright 2022 American Medical Association. All rights reserved. The codes documented in this report are preliminary and upon coder review may  be revised to meet current compliance requirements. Aloha Finner, MD 09/12/2024 3:31:33 PM Number of Addenda: 0

## 2024-09-12 NOTE — Transfer of Care (Signed)
 Immediate Anesthesia Transfer of Care Note  Patient: [name] [name].  Procedure(s) Performed: ULTRASOUND, LOWER GI TRACT, ENDOSCOPIC SIGMOIDOSCOPY, FLEXIBLE  Patient Location: PACU  Anesthesia Type:MAC  Level of Consciousness: awake and alert   Airway & Oxygen Therapy: Patient Spontanous Breathing and Patient connected to nasal cannula oxygen  Post-op Assessment: Report given to RN and Post -op Vital signs reviewed and stable  Post vital signs: Reviewed and stable  Last Vitals:  Vitals Value Taken Time  BP 131/66 09/12/24 15:30  Temp 36.4 C 09/12/24 15:30  Pulse 75 09/12/24 15:32  Resp 15 09/12/24 15:32  SpO2 94 % 09/12/24 15:32  Vitals shown include unfiled device data.  Last Pain:  Vitals:   09/12/24 1530  TempSrc: Temporal  PainSc: 0-No pain         Complications: No notable events documented.

## 2024-09-12 NOTE — H&P (Signed)
 "  GASTROENTEROLOGY PROCEDURE H&P NOTE   Primary Care Physician: Severa Rock HERO, FNP  HPI: [name] [name]. is a 78 y.o. 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  Past Medical History:  Diagnosis Date   Cancer [facility])    Coronary artery disease    Erectile dysfunction    HFrEF (heart failure with reduced ejection fraction) (HCC)    Echocardiogram 07/14/21:  EF 25, global HK, inf HK worse, Gr 2 DD, normal RVSF, mild to mod MR, AV sclerosis w/o AS Echocardiogram 2/23: EF 25-30, global HK, GLS -12.9, normal RVSF, mild to mod MR, AV sclerosis w/o AS    History of hiatal hernia    a lone time ago   Hyperlipidemia    [place] fracture [facility]) 03/22/2020   Myocardial infarction (HCC)    x 2   [place]    Past Surgical History:  Procedure Laterality Date   APPENDECTOMY     CARDIAC CATHETERIZATION     has two stents placed   CERVICAL SPINE SURGERY     c3-4, 4-5,  5-6, 8 screws and plates   COLONOSCOPY N/A 08/08/2024   Procedure: COLONOSCOPY;  Surgeon: Charlanne Groom, MD;  Location: [facility];  Service: Gastroenterology;  Laterality: N/A;  has colostomy bag   COLOSTOMY Left 02/01/2024   Procedure: CREATION, COLOSTOMY;  Surgeon: Polly Cordella LABOR, MD;  Location: [facility] OR;  Service: General;  Laterality: Left;   CORONARY STENT INTERVENTION N/A 07/15/2021   Procedure: CORONARY STENT INTERVENTION;  Surgeon: Court Dorn JINNY, MD;  Location: [facility];  Service: Cardiovascular;  Laterality: N/A;   CORONARY STENT PLACEMENT     forein body removal     metal sharp removed from right leg   LAPAROTOMY N/A 02/01/2024   Procedure: LAPAROTOMY, EXPLORATORY;  Surgeon: Polly Cordella LABOR, MD;  Location: [facility] OR;  Service: General;  Laterality: N/A;  POSSIBLE OSTOMY   LEFT HEART CATH AND CORONARY ANGIOGRAPHY N/A 11/15/2018   Procedure: LEFT HEART CATH AND CORONARY ANGIOGRAPHY;  Surgeon:  Mady Bruckner, MD;  Location: [facility];  Service: Cardiovascular;  Laterality: N/A;   LEFT HEART CATH AND CORONARY ANGIOGRAPHY N/A 07/15/2021   Procedure: LEFT HEART CATH AND CORONARY ANGIOGRAPHY;  Surgeon: Court Dorn JINNY, MD;  Location: [facility];  Service: Cardiovascular;  Laterality: N/A;   MANDIBULAR HARDWARE REMOVAL Bilateral 04/23/2020   Procedure: MANDIBULAR HARDWARE REMOVAL;  Surgeon: Jesus Oliphant, MD;  Location: [facility];  Service: ENT;  Laterality: Bilateral;   ORIF MANDIBULAR FRACTURE N/A 03/14/2020   Procedure: OPEN REDUCTION INTERNAL FIXATION (ORIF) MID  FACE FRACTURE, MANDIBULAR FIXATION MODIFIED ARCH BARS;  Surgeon: Jesus Oliphant, MD;  Location[facility];  Service: ENT;  Laterality: N/A;   PARTIAL COLECTOMY N/A 02/01/2024   Procedure: COLECTOMY, PARTIAL;  Surgeon: Polly Cordella LABOR, MD;  Location: [facility] OR;  Service: General;  Laterality: N/A;  sigmoid colon   POLYPECTOMY  08/08/2024   Procedure: POLYPECTOMY, INTESTINE;  Surgeon: Charlanne Groom, MD;  Location: [facility];  Service: Gastroenterology;;   SCAR REVISION N/A 04/23/2020   Procedure: SCAR REVISION/ Upper Lip Repair;  Surgeon: Jesus Oliphant, MD;  Location: [facility];  Service: ENT;  Laterality: N/A;   Current Facility-Administered Medications  Medication Dose Route Frequency Provider Last Rate Last Admin   0.9 %  sodium chloride  infusion   Intravenous Continuous Mansouraty, Aloha Mickey., MD  Current Medications[1] Allergies[2] Family History  Problem Relation Age of Onset   Diabetes Mother    Heart disease Mother    Heart disease Father    Social History   Socioeconomic History   Marital status: Divorced    Spouse name: Not on file   Number of children: 2   Years of education: 12   Highest education level: 12th grade  Occupational History   Occupation: retired    Comment: farmer  Tobacco Use   Smoking status: Former    Types: Cigarettes    Passive  exposure: Past   Smokeless tobacco: Never  Vaping Use   Vaping status: Never Used  Substance and Sexual Activity   Alcohol use: Yes    Alcohol/week: 21.0 standard drinks of alcohol    Types: 21 Cans of beer per week    Comment: 2 beers daily   Drug use: No   Sexual activity: Not on file  Other Topics Concern   Not on file  Social History Narrative   Mr. Appleyard has a restraining order against one of his sons.   Social Drivers of Health   Tobacco Use: Medium Risk (09/12/2024)   Patient History    Smoking Tobacco Use: Former    Smokeless Tobacco Use: Never    Passive Exposure: Past  Physicist, Medical Strain: Low Risk (05/20/2024)   Overall Financial Resource Strain (CARDIA)    Difficulty of Paying Living Expenses: Not hard at all  Food Insecurity: No Food Insecurity (05/20/2024)   Epic    Worried About Programme Researcher, Broadcasting/film/video in the Last Year: Never true    Ran Out of Food in the Last Year: Never true  Transportation Needs: No Transportation Needs (05/20/2024)   Epic    Lack of Transportation (Medical): No    Lack of Transportation (Non-Medical): No  Physical Activity: Unknown (02/16/2024)   Exercise Vital Sign    Days of Exercise per Week: 0 days    Minutes of Exercise per Session: Not on file  Stress: Stress Concern Present (02/22/2024)   [employer] of Occupational Health - Occupational Stress Questionnaire    Feeling of Stress : To some extent  Social Connections: Moderately Integrated (02/16/2024)   Social Connection and Isolation Panel    Frequency of Communication with Friends and Family: More than three times a week    Frequency of Social Gatherings with Friends and Family: More than three times a week    Attends Religious Services: More than 4 times per year    Active Member of Clubs or Organizations: Yes    Attends Banker Meetings: More than 4 times per year    Marital Status: Divorced  Intimate Partner Violence: Not At Risk (05/20/2024)   Epic    Fear of  Current or Ex-Partner: No    Emotionally Abused: No    Physically Abused: No    Sexually Abused: No  Depression (PHQ2-9): Low Risk (08/24/2024)   Depression (PHQ2-9)    PHQ-2 Score: 0  Alcohol Screen: Low Risk (04/20/2024)   Alcohol Screen    Last Alcohol Screening Score (AUDIT): 2  Housing: Low Risk (05/20/2024)   Epic    Unable to Pay for Housing in the Last Year: No    Number of Times Moved in the Last Year: 0    Homeless in the Last Year: No  Recent Concern: Housing - High Risk (02/22/2024)   Housing Stability Vital Sign    Unable to Pay for Housing in  the Last Year: Yes    Number of Times Moved in the Last Year: 0    Homeless in the Last Year: No  Utilities: Not At Risk (05/20/2024)   Epic    Threatened with loss of utilities: No  Health Literacy: Adequate Health Literacy (02/16/2024)   B1300 Health Literacy    Frequency of need for help with medical instructions: Never    Physical Exam: Today's Vitals   09/02/24 1207  Weight: 62 kg   Body mass index is 22.06 kg/m. GEN: NAD EYE: Sclerae anicteric ENT: MMM CV: Non-tachycardic GI: Soft, NT/ND NEURO:  Alert & Oriented x 3  Lab Results: No results for input(s): WBC, HGB, HCT, PLT in the last 72 hours. BMET No results for input(s): NA, K, CL, CO2, GLUCOSE, BUN, CREATININE, CALCIUM  in the last 72 hours. LFT No results for input(s): PROT, ALBUMIN, AST, ALT, ALKPHOS, BILITOT, BILIDIR, IBILI in the last 72 hours. PT/INR No results for input(s): LABPROT, INR in the last 72 hours.   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  The risks of an EUS including intestinal perforation, bleeding, infection, aspiration, and medication effects were discussed as was the possibility it may not give a definitive diagnosis if a biopsy is performed.     The risks and benefits of endoscopic evaluation/treatment were discussed with the patient and/or family; these include but are not limited to the risk of perforation, infection, bleeding, missed lesions, lack of diagnosis, severe illness requiring hospitalization, as well as anesthesia and sedation related illnesses.  The patient's history has been reviewed, patient examined, no change in status, and deemed stable for procedure.  The patient and/or family was provided an opportunity to ask questions and all were answered.  The patient and/or family is agreeable to proceed.    Aloha Finner, MD [facility] Office # [phone]     [1]  Current Facility-Administered Medications:    0.9 %  sodium chloride  infusion, , Intravenous, Continuous, Mansouraty, Aloha Raddle., MD [2]  Allergies Allergen Reactions   Brilinta  [Ticagrelor ] Shortness Of Breath   Carvedilol  Diarrhea   Lipitor [Atorvastatin] Other (See Comments)    Makes pt feel funky   Plavix  [Clopidogrel ] Rash   "

## 2024-09-12 NOTE — Anesthesia Preprocedure Evaluation (Signed)
"                                    Anesthesia Evaluation  Patient identified by MRN, date of birth, ID band Patient awake    Reviewed: Allergy & Precautions, NPO status , Patient's Chart, lab work & pertinent test results, reviewed documented beta blocker date and time   History of Anesthesia Complications Negative for: history of anesthetic complications  Airway Mallampati: II  TM Distance: >3 FB     Dental no notable dental hx.    Pulmonary neg COPD, Patient abstained from smoking., former smoker   breath sounds clear to auscultation       Cardiovascular hypertension, Pt. on medications + CAD, + Past MI and + Peripheral Vascular Disease   Rhythm:Regular Rate:Normal  IMPRESSIONS     1. LV function largely unchanged. No evidence of LV thrombus. Left  ventricular ejection fraction, by estimation, is 25 to 30%. The left  ventricle has severely decreased function. The left ventricle demonstrates  global hypokinesis.   2. The right ventricular size is normal.   3. The mitral valve is normal in structure. No evidence of mitral valve  regurgitation.   4. The aortic valve is tricuspid. Aortic valve regurgitation is not  visualized.     Neuro/Psych neg Seizures    GI/Hepatic hiatal hernia,,,(+) neg Cirrhosis        Endo/Other    Renal/GU Renal disease     Musculoskeletal   Abdominal   Peds  Hematology   Anesthesia Other Findings   Reproductive/Obstetrics                              Anesthesia Physical Anesthesia Plan  ASA: 4  Anesthesia Plan: MAC   Post-op Pain Management:    Induction: Intravenous  PONV Risk Score and Plan: 1 and Ondansetron  and Propofol  infusion  Airway Management Planned: Natural Airway and Simple Face Mask  Additional Equipment:   Intra-op Plan:   Post-operative Plan:   Informed Consent: I have reviewed the patients History and Physical, chart, labs and discussed the procedure  including the risks, benefits and alternatives for the proposed anesthesia with the patient or authorized representative who has indicated his/her understanding and acceptance.     Dental advisory given  Plan Discussed with: CRNA  Anesthesia Plan Comments:          Anesthesia Quick Evaluation  "

## 2024-09-13 ENCOUNTER — Encounter (HOSPITAL_COMMUNITY): Payer: Self-pay | Admitting: Gastroenterology

## 2024-09-13 LAB — SURGICAL PATHOLOGY

## 2024-09-13 NOTE — Anesthesia Postprocedure Evaluation (Signed)
"   Anesthesia Post Note  Patient: Dolphus Linch.  Procedure(s) Performed: ULTRASOUND, LOWER GI TRACT, ENDOSCOPIC SIGMOIDOSCOPY, FLEXIBLE     Patient location during evaluation: PACU Anesthesia Type: MAC Level of consciousness: awake and alert Pain management: pain level controlled Vital Signs Assessment: post-procedure vital signs reviewed and stable Respiratory status: spontaneous breathing, nonlabored ventilation, respiratory function stable and patient connected to nasal cannula oxygen Cardiovascular status: blood pressure returned to baseline and stable Postop Assessment: no apparent nausea or vomiting Anesthetic complications: no   No notable events documented.  Last Vitals:  Vitals:   09/12/24 1540 09/12/24 1550  BP: (!) 143/69 (!) 140/66  Pulse: 68 65  Resp: 15 20  Temp:    SpO2: 96% 98%    Last Pain:  Vitals:   09/12/24 1550  TempSrc:   PainSc: 0-No pain                 Lynwood MARLA Cornea      "

## 2024-09-14 ENCOUNTER — Ambulatory Visit: Payer: Self-pay | Admitting: Gastroenterology

## 2024-09-16 ENCOUNTER — Telehealth: Payer: Self-pay

## 2024-09-16 NOTE — Telephone Encounter (Signed)
 Copied from CRM #[id number]. Topic: General - Other >> Sep 14, 2024  2:47 PM Antwanette L wrote: Reason for CRM: Nat from [facility] called because she was scheduled to see the patient today, but he is out of town. The patient will have a missed nursing visit. Nat can be reached at [phone]

## 2024-09-21 ENCOUNTER — Other Ambulatory Visit: Payer: Self-pay | Admitting: *Deleted

## 2024-09-21 NOTE — Patient Outreach (Signed)
 Complex Care Management   Visit Note  09/21/2024  Name:  [name] [name]. MRN: [id number] DOB: [birth year]  Situation: Referral received for Complex Care Management related to Colon Cancer I obtained verbal consent from Patient.  Visit completed with Patient  on the phone  Background:   Past Medical History:  Diagnosis Date   Cancer [facility])    Coronary artery disease    Erectile dysfunction    HFrEF (heart failure with reduced ejection fraction) (HCC)    Echocardiogram 07/14/21:  EF 25, global HK, inf HK worse, Gr 2 DD, normal RVSF, mild to mod MR, AV sclerosis w/o AS Echocardiogram 2/23: EF 25-30, global HK, GLS -12.9, normal RVSF, mild to mod MR, AV sclerosis w/o AS    History of hiatal hernia    a lone time ago   Hyperlipidemia    [place] fracture [facility]) 03/22/2020   Myocardial infarction (HCC)    x 2   Polio     Assessment: Patient Reported Symptoms:  Cognitive Cognitive Status: No symptoms reported Cognitive/Intellectual Conditions Management [RPT]: None reported or documented in medical history or problem list   Health Maintenance Behaviors: Annual physical exam Healing Pattern: Average Health Facilitated by: Rest  Neurological Neurological Review of Symptoms: No symptoms reported Neurological Management Strategies: Routine screening Neurological Self-Management Outcome: 5 (very good)  HEENT HEENT Symptoms Reported: No symptoms reported HEENT Management Strategies: Exercise HEENT Self-Management Outcome: 5 (very good)    Cardiovascular Cardiovascular Symptoms Reported: No symptoms reported Does patient have uncontrolled Hypertension?: No Cardiovascular Management Strategies: Routine screening Cardiovascular Self-Management Outcome: 5 (very good)  Respiratory Respiratory Symptoms Reported: No symptoms reported Respiratory Management Strategies: Routine screening Respiratory Self-Management Outcome: 5 (very good)  Endocrine Endocrine Symptoms Reported: No symptoms  reported Is patient diabetic?: No Endocrine Self-Management Outcome: 5 (very good)  Gastrointestinal Gastrointestinal Symptoms Reported: No symptoms reported Gastrointestinal Management Strategies: Colostomy, Coping strategies    Genitourinary Genitourinary Symptoms Reported: No symptoms reported Genitourinary Self-Management Outcome: 4 (good)  Integumentary Integumentary Symptoms Reported: No symptoms reported Skin Management Strategies: Routine screening, Dressing changes Skin Self-Management Outcome: 4 (good)  Musculoskeletal Musculoskelatal Symptoms Reviewed: No symptoms reported Musculoskeletal Management Strategies: Routine screening Musculoskeletal Self-Management Outcome: 5 (very good) Falls in the past year?: Yes Number of falls in past year: 1 or less Was there an injury with Fall?: No Fall Risk Category Calculator: 1 Patient Fall Risk Level: Low Fall Risk Patient at Risk for Falls Due to: History of fall(s) Fall risk Follow up: Falls evaluation completed, Education provided  Psychosocial Psychosocial Symptoms Reported: No symptoms reported Behavioral Management Strategies: Coping strategies Behavioral Health Self-Management Outcome: 5 (very good) Major Change/Loss/Stressor/Fears (CP): Denies      09/21/2024    PHQ2-9 Depression Screening   Little interest or pleasure in doing things Not at all  Feeling down, depressed, or hopeless Not at all  PHQ-2 - Total Score 0  Trouble falling or staying asleep, or sleeping too much    Feeling tired or having little energy    Poor appetite or overeating     Feeling bad about yourself - or that you are a failure or have let yourself or your family down    Trouble concentrating on things, such as reading the newspaper or watching television    Moving or speaking so slowly that other people could have noticed.  Or the opposite - being so fidgety or restless that you have been moving around a lot more than usual    Thoughts that  you  would be better off dead, or hurting yourself in some way    PHQ2-9 Total Score    If you checked off any problems, how difficult have these problems made it for you to do your work, take care of things at home, or get along with other people    Depression Interventions/Treatment      There were no vitals filed for this visit. Pain Score: 0-No pain  Medications Reviewed Today     Reviewed by Bertrum Rosina HERO, RN (Registered Nurse) on 09/21/24 at 1033  Med List Status: <None>   Medication Order Taking? Sig Documenting Provider Last Dose Status Informant  cholecalciferol  (VITAMIN D3) 25 MCG (1000 UNIT) tablet [id number] Yes Take 1,000 Units by mouth daily.  [provider]  Active Self, Pharmacy Records  dabigatran  (PRADAXA ) 150 MG CAPS capsule [id number] Yes Take 1 capsule (150 mg total) by mouth 2 (two) times daily. Please use good rx for $55 Severa Rock HERO, FNP  Active   ketoconazole (NIZORAL) 2 % cream [id number] Yes Apply 1 Application topically 2 (two) times daily. [provider]  Active Self, Pharmacy Records  losartan  (COZAAR ) 25 MG tablet [id number] Yes Take 0.5 tablets (12.5 mg total) by mouth daily. Lelon Hamilton T, PA-C  Active   metoprolol  tartrate (LOPRESSOR ) 25 MG tablet [id number] Yes Take 0.5 tablets (12.5 mg total) by mouth 2 (two) times daily. Lelon Hamilton T, PA-C  Active   nitroGLYCERIN  (NITROSTAT ) 0.4 MG SL tablet [id number] Yes Place 1 tablet (0.4 mg total) under the tongue every 5 (five) minutes as needed for chest pain. Merlynn Niki FALCON, FNP  Active Self, Pharmacy Records           Med Note SOUNDRA, BOBETTA PARAS   Wed Jul 13, 2024 11:16 AM) Need refill  oxyCODONE  (OXY IR/ROXICODONE ) 5 MG immediate release tablet [id number] Yes Take 1-2 tablets (5-10 mg total) by mouth every 6 (six) hours as needed for severe pain (pain score 7-10). Verdene Purchase, MD  Active            Med Note SOUNDRA, EBONY J   Wed Jul 13, 2024 11:16 AM) Has not used it  rosuvastatin   (CRESTOR ) 20 MG tablet [id number] Yes Take 1 tablet (20 mg total) by mouth daily. Lelon Hamilton T, PA-C  Active   spironolactone  (ALDACTONE ) 25 MG tablet [id number] Yes Take 0.5 tablets (12.5 mg total) by mouth daily. Lelon Hamilton DASEN, PA-C  Active   Med List Note Teretha Renaee SAILOR, RPH-CPP 03/08/24 9079): Xeloda  filled at [facility] (Specialty)            Recommendation:   Continue Current Plan of Care  Follow Up Plan:   Telephone follow-up in 1 month  Rosina Bertrum, BSN RN [facility], [facility] RN Care Manager Direct Dial: [phone]  Fax: [phone]

## 2024-09-21 NOTE — Patient Instructions (Signed)
 Visit Information  Thank you for taking time to visit with me today. Please don't hesitate to contact me if I can be of assistance to you before our next scheduled appointment.  Your next care management appointment is by telephone on 10-22-2023 at 10:30 am  Telephone follow-up in 1 month  Please call the care guide team at [phone] if you need to cancel, schedule, or reschedule an appointment.   Please call the Suicide and Crisis Lifeline: 988 call the [place]  National Suicide Prevention Lifeline: (312) 265-1297 or TTY: 838-377-9955 TTY 484-471-8147) to talk to a trained counselor call 1-800-273-TALK (toll free, 24 hour hotline) if you are experiencing a Mental Health or Behavioral Health Crisis or need someone to talk to.  Rosina Forte, BSN RN [facility], [facility] RN Care Manager Direct Dial: [phone]  Fax: [phone]

## 2024-09-22 NOTE — Progress Notes (Signed)
 [name] [name].         09/14/2024 [address] [place] [address]          Dear [name],  I am writing to inform you that the pathology taken during your recent lower endoscopic ultrasound showed:   FINAL MICROSCOPIC DIAGNOSIS:  A. RECTUM, SCAR SITE, BIOPSY:  - Rectal mucosa showing nonspecific mild architectural and inflammatory  changes, compatible with clinically stated scar site  - Negative for dysplasia or malignancy    What does this all mean? The ulcer scar site samples/biopsies have all returned showing evidence of inflammation.  No precancerous changes or cancer cells were found in the sampling performed.  That is good news.  I will forward these results to your oncologist and your surgeon and your primary gastroenterologist.  I think it would be reasonable to consider allowing time to let the area heal and repeat a sigmoidoscopy in a few months time to help determine and ensure that nothing else is regrowing in that site before consideration of ostomy reversal.  We will come up with a plan for you in regards to your overall care in the coming weeks.  Tentatively we will place a recall for a 15-month sigmoidoscopy, and we will adjust accordingly after further discussions if necessary.  Please call us  at [phone]   if you have persistent problems or have questions about your condition that have not been fully answered at this time.   Sincerely,  Aloha Wilhelmenia [name]., MD

## 2024-09-22 NOTE — Telephone Encounter (Signed)
 Inbound call to have a nurse further discuss pathology results. Please advise.

## 2024-09-28 ENCOUNTER — Other Ambulatory Visit: Payer: Self-pay

## 2024-09-28 NOTE — Progress Notes (Signed)
 The proposed treatment discussed in conference is for discussion purpose only and is not a binding recommendation.  The patients have not been physically examined, or presented with their treatment options.  Therefore, final treatment plans cannot be decided.

## 2024-10-06 ENCOUNTER — Inpatient Hospital Stay: Admitting: Oncology

## 2024-10-06 ENCOUNTER — Inpatient Hospital Stay: Attending: Oncology

## 2024-10-06 VITALS — BP 119/74 | HR 78 | Temp 98.1°F | Resp 18 | Ht 66.0 in | Wt 145.7 lb

## 2024-10-06 DIAGNOSIS — C187 Malignant neoplasm of sigmoid colon: Secondary | ICD-10-CM

## 2024-10-06 LAB — CEA (ACCESS): CEA (CHCC): 3.13 ng/mL (ref 0.00–5.00)

## 2024-10-06 NOTE — Progress Notes (Signed)
 " [facility] OFFICE PROGRESS NOTE   Diagnosis: Colon cancer  INTERVAL HISTORY:   [name] [name] returns as scheduled.  He reports feeling well.  The colostomy is functioning well.  No bleeding.  Good appetite.  He has mild remaining dryness at the palm of the right hand.  He would like to have the colostomy reversed as soon as possible.  Objective:  Vital signs in last 24 hours:  Blood pressure 119/74, pulse 78, temperature 98.1 F (36.7 C), temperature source Temporal, resp. rate 18, height 5' 6 (1.676 m), weight 145 lb 11.2 oz (66.1 kg), SpO2 100%.   Lymphatics: No cervical, supraclavicular, axillary, or inguinal nodes Resp: Distant breath sounds, no respiratory distress Cardio: Regular rate and rhythm GI: No hepatosplenomegaly, left abdomen colostomy, no mass Vascular: No leg edema   Lab Results:  Lab Results  Component Value Date   WBC 4.9 08/23/2024   HGB 14.4 08/23/2024   HCT 42.0 08/23/2024   MCV 93.6 08/23/2024   PLT 192.0 08/23/2024   NEUTROABS 3.6 08/23/2024    CMP  Lab Results  Component Value Date   NA 138 08/23/2024   K 4.3 08/23/2024   CL 101 08/23/2024   CO2 29 08/23/2024   GLUCOSE 78 08/23/2024   BUN 11 08/23/2024   CREATININE 0.89 08/23/2024   CALCIUM  9.6 08/23/2024   PROT 7.1 08/23/2024   ALBUMIN 4.2 08/23/2024   AST 26 08/23/2024   ALT 27 08/23/2024   ALKPHOS 68 08/23/2024   BILITOT 0.5 08/23/2024   GFRNONAA >60 06/16/2024   GFRAA 104 05/01/2020    Lab Results  Component Value Date   CEA1 3.3 02/02/2024   CEA 3.13 10/06/2024     Medications: I have reviewed the patient's current medications.   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), status post a sigmoidectomy and end colostomy 02/01/2024 0/11 nodes, positive as enteric and distal margin, macroscopic tumor perforation-carcinoma involves distal disrupted segment CT angio chest/abdomen/pelvis 01/26/2024: Severe emphysema, segmental circumferential  sigmoid thickening with associated contained perforation/abscess no adenopathy CT abdomen/pelvis 01/29/2024: Persistent perforated sigmoid diverticulitis with 2 adjacent fluid collections, 1 containing gas CT chest 02/02/2024: Tiny bilateral noncalcified pulmonary nodules nonspecific severe emphysema 03/07/2024-guardant reveal ctDNA not detected Cycle 1 capecitabine  03/14/2024 Cycle 2 capecitabine  04/04/2024 Cycle 3 capecitabine  04/25/2024 Began radiation 05/23/24 x5.5 weeks, discontinue Xeloda  after 1 week, completed radiation 06/28/2024 Guardant reveal ctDNA not detected 06/03/24  Colonoscopy 08/08/2024-polyps removed from the distal transverse colon (tubular adenoma), and rectum-fragments of tubular adenoma focally concerning for adenocarcinoma 08/20/2024 MRI pelvis: No focal mass in the rectum or sigmoid colon, no pelvic lymphadenopathy 09/12/2024 lower EUS: Ulcer in the mid rectum at 8 cm, wall thickening at 8-9 cm, noncircumferential, involving luminal interface/superficial mucosa and deep mucosa, no malignant appearing lymph nodes-biopsy negative for dysplasia or malignancy COPD CAD CHF, possible early LV thrombus on echocardiogram 01/28/2024, LVEF 25-30%-apixaban  Hypertension Hyperlipidemia Pulmonary nodules-nonspecific pulmonary nodules on CT 01/26/2024 and 02/02/2024     Disposition: [name] is in clinical remission from colon cancer.  A colonoscopy 08/08/2024 revealed concern for adenocarcinoma involving a rectal polyp.  A staging evaluation with a pelvic MRI and EUS are negative. He remains at high risk for developing recurrent colon cancer based on the initial positive margin and tumor perforation.  He will continue follow-up with gastroenterology to decide on the timing of rectal surveillance and the potential to reverse the colostomy.  He will return for an office visit and CEA in 3 months.  We will plan for surveillance CTs in May of 2026.    [name] Hof, MD  10/06/2024  3:05  PM   "

## 2024-10-06 NOTE — Progress Notes (Deleted)
" °  [facility] OFFICE PROGRESS NOTE   Diagnosis:   INTERVAL HISTORY:   ***  Objective:  Vital signs in last 24 hours:  Blood pressure 119/74, pulse 78, temperature 98.1 F (36.7 C), temperature source Temporal, resp. rate 18, height 5' 6 (1.676 m), weight 145 lb 11.2 oz (66.1 kg), SpO2 100%.    HEENT: *** Lymphatics: *** Resp: *** Cardio: *** GI: *** Vascular: *** Neuro:***  Skin:***   Portacath/PICC-without erythema  Lab Results:  Lab Results  Component Value Date   WBC 4.9 08/23/2024   HGB 14.4 08/23/2024   HCT 42.0 08/23/2024   MCV 93.6 08/23/2024   PLT 192.0 08/23/2024   NEUTROABS 3.6 08/23/2024    CMP  Lab Results  Component Value Date   NA 138 08/23/2024   K 4.3 08/23/2024   CL 101 08/23/2024   CO2 29 08/23/2024   GLUCOSE 78 08/23/2024   BUN 11 08/23/2024   CREATININE 0.89 08/23/2024   CALCIUM  9.6 08/23/2024   PROT 7.1 08/23/2024   ALBUMIN 4.2 08/23/2024   AST 26 08/23/2024   ALT 27 08/23/2024   ALKPHOS 68 08/23/2024   BILITOT 0.5 08/23/2024   GFRNONAA >60 06/16/2024   GFRAA 104 05/01/2020    Lab Results  Component Value Date   CEA1 3.3 02/02/2024   CEA 3.13 10/06/2024    Lab Results  Component Value Date   INR 1.1 07/13/2021   LABPROT 13.9 07/13/2021    Imaging:  No results found.  Medications: I have reviewed the patient's current medications.   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), status post a sigmoidectomy and end colostomy 02/01/2024 0/11 nodes, positive as enteric and distal margin, macroscopic tumor perforation-carcinoma involves distal disrupted segment CT angio chest/abdomen/pelvis 01/26/2024: Severe emphysema, segmental circumferential sigmoid thickening with associated contained perforation/abscess no adenopathy CT abdomen/pelvis 01/29/2024: Persistent perforated sigmoid diverticulitis with 2 adjacent fluid collections, 1 containing gas CT chest 02/02/2024: Tiny bilateral  noncalcified pulmonary nodules nonspecific severe emphysema 03/07/2024-guardant reveal ctDNA not detected Cycle 1 capecitabine  03/14/2024 Cycle 2 capecitabine  04/04/2024 Cycle 3 capecitabine  04/25/2024 Began radiation 05/23/24 x5.5 weeks, discontinue Xeloda  after 1 week, completed radiation 06/28/2024 Guardant reveal ctDNA not detected 06/03/24  COPD CAD CHF, possible early LV thrombus on echocardiogram 01/28/2024, LVEF 25-30%-apixaban  Hypertension Hyperlipidemia Pulmonary nodules-nonspecific pulmonary nodules on CT 01/26/2024 and 02/02/2024    Disposition: ***  Arley Hof, MD  10/06/2024  11:27 AM   "

## 2024-10-12 ENCOUNTER — Ambulatory Visit

## 2024-10-12 DIAGNOSIS — N529 Male erectile dysfunction, unspecified: Secondary | ICD-10-CM | POA: Diagnosis not present

## 2024-10-12 DIAGNOSIS — E782 Mixed hyperlipidemia: Secondary | ICD-10-CM | POA: Diagnosis not present

## 2024-10-12 DIAGNOSIS — Z433 Encounter for attention to colostomy: Secondary | ICD-10-CM

## 2024-10-12 DIAGNOSIS — Z48815 Encounter for surgical aftercare following surgery on the digestive system: Secondary | ICD-10-CM | POA: Diagnosis not present

## 2024-10-12 DIAGNOSIS — I2510 Atherosclerotic heart disease of native coronary artery without angina pectoris: Secondary | ICD-10-CM | POA: Diagnosis not present

## 2024-10-12 DIAGNOSIS — D630 Anemia in neoplastic disease: Secondary | ICD-10-CM

## 2024-10-12 DIAGNOSIS — I110 Hypertensive heart disease with heart failure: Secondary | ICD-10-CM | POA: Diagnosis not present

## 2024-10-12 DIAGNOSIS — I252 Old myocardial infarction: Secondary | ICD-10-CM | POA: Diagnosis not present

## 2024-10-12 DIAGNOSIS — I739 Peripheral vascular disease, unspecified: Secondary | ICD-10-CM | POA: Diagnosis not present

## 2024-10-12 DIAGNOSIS — K5790 Diverticulosis of intestine, part unspecified, without perforation or abscess without bleeding: Secondary | ICD-10-CM | POA: Diagnosis not present

## 2024-10-12 DIAGNOSIS — C189 Malignant neoplasm of colon, unspecified: Secondary | ICD-10-CM

## 2024-10-12 DIAGNOSIS — I5022 Chronic systolic (congestive) heart failure: Secondary | ICD-10-CM | POA: Diagnosis not present

## 2024-10-13 ENCOUNTER — Ambulatory Visit (HOSPITAL_COMMUNITY)

## 2024-10-21 ENCOUNTER — Encounter: Payer: Self-pay | Admitting: *Deleted

## 2024-10-21 ENCOUNTER — Other Ambulatory Visit: Payer: Self-pay | Admitting: *Deleted

## 2024-10-21 NOTE — Patient Instructions (Signed)
 [place] [name] [name] Tish Mickey. - I am sorry I was unable to reach you today for our scheduled appointment. I work with Severa Rock HERO, FNP and am calling to support your healthcare needs. Please contact me at [phone] at your earliest convenience. I look forward to speaking with you soon.   Thank you,   Rosina Forte, BSN RN [facility], [facility] Health RN Care Manager Direct Dial: [phone]  Fax: [phone]

## 2024-10-21 NOTE — Patient Instructions (Signed)
 Visit Information  Thank you for taking time to visit with me today. Please don't hesitate to contact me if I can be of assistance to you before our next scheduled appointment.  Your next care management appointment is by telephone on 11-18-2024 at 10:30 am  Telephone follow-up in 1 month  Please call the care guide team at [phone] if you need to cancel, schedule, or reschedule an appointment.   Please call the Suicide and Crisis Lifeline: 988 call the [place]  National Suicide Prevention Lifeline: 581-433-5240 or TTY: (475) 701-9202 TTY 431-081-2323) to talk to a trained counselor call 1-800-273-TALK (toll free, 24 hour hotline) if you are experiencing a Mental Health or Behavioral Health Crisis or need someone to talk to.  Rosina Forte, BSN RN [facility], [facility] RN Care Manager Direct Dial: [phone]  Fax: [phone]

## 2024-10-21 NOTE — Patient Outreach (Addendum)
 Complex Care Management   Visit Note  10/21/2024  Name:  [name] [name]. MRN: [id number] DOB: 1945-10-06  Situation: Referral received for Complex Care Management related to Colon Cancer I obtained verbal consent from Patient.  Visit completed with Patient  on the phone  Background:   Past Medical History:  Diagnosis Date   Cancer [facility])    Coronary artery disease    Erectile dysfunction    HFrEF (heart failure with reduced ejection fraction) (HCC)    Echocardiogram 07/14/21:  EF 25, global HK, inf HK worse, Gr 2 DD, normal RVSF, mild to mod MR, AV sclerosis w/o AS Echocardiogram 2/23: EF 25-30, global HK, GLS -12.9, normal RVSF, mild to mod MR, AV sclerosis w/o AS    History of hiatal hernia    a lone time ago   Hyperlipidemia    [place] fracture [facility]) 03/22/2020   Myocardial infarction (HCC)    x 2   Polio     Assessment: Patient Reported Symptoms:  Cognitive Cognitive Status: No symptoms reported Cognitive/Intellectual Conditions Management [RPT]: None reported or documented in medical history or problem list   Health Maintenance Behaviors: Annual physical exam Healing Pattern: Average  Neurological Neurological Review of Symptoms: No symptoms reported Neurological Management Strategies: Routine screening Neurological Self-Management Outcome: 5 (very good)  HEENT HEENT Symptoms Reported: No symptoms reported HEENT Management Strategies: Routine screening HEENT Self-Management Outcome: 5 (very good)    Cardiovascular Cardiovascular Symptoms Reported: No symptoms reported Does patient have uncontrolled Hypertension?: No Cardiovascular Management Strategies: Routine screening Cardiovascular Self-Management Outcome: 5 (very good)  Respiratory Respiratory Symptoms Reported: No symptoms reported Respiratory Self-Management Outcome: 5 (very good)  Endocrine Endocrine Symptoms Reported: No symptoms reported Is patient diabetic?: No Endocrine Self-Management Outcome: 5  (very good)  Gastrointestinal Gastrointestinal Symptoms Reported: No symptoms reported Gastrointestinal Management Strategies: Colostomy, Coping strategies    Genitourinary Genitourinary Symptoms Reported: No symptoms reported Genitourinary Self-Management Outcome: 5 (very good)  Integumentary Integumentary Symptoms Reported: Bruising Skin Management Strategies: Routine screening Skin Self-Management Outcome: 4 (good)  Musculoskeletal Musculoskelatal Symptoms Reviewed: Weakness Musculoskeletal Management Strategies: Routine screening Musculoskeletal Self-Management Outcome: 4 (good) Falls in the past year?: Yes Number of falls in past year: 1 or less Was there an injury with Fall?: No Fall Risk Category Calculator: 1 Patient Fall Risk Level: Low Fall Risk Patient at Risk for Falls Due to: History of fall(s) Fall risk Follow up: Falls evaluation completed  Psychosocial Psychosocial Symptoms Reported: No symptoms reported Behavioral Management Strategies: Coping strategies Behavioral Health Self-Management Outcome: 4 (good) Major Change/Loss/Stressor/Fears (CP): Denies      10/21/2024    PHQ2-9 Depression Screening   Little interest or pleasure in doing things Not at all  Feeling down, depressed, or hopeless Not at all  PHQ-2 - Total Score 0  Trouble falling or staying asleep, or sleeping too much    Feeling tired or having little energy    Poor appetite or overeating     Feeling bad about yourself - or that you are a failure or have let yourself or your family down    Trouble concentrating on things, such as reading the newspaper or watching television    Moving or speaking so slowly that other people could have noticed.  Or the opposite - being so fidgety or restless that you have been moving around a lot more than usual    Thoughts that you would be better off dead, or hurting yourself in some way    PHQ2-9 Total  Score    If you checked off any problems, how difficult have these  problems made it for you to do your work, take care of things at home, or get along with other people    Depression Interventions/Treatment      There were no vitals filed for this visit. Pain Scale: 0-10 Pain Score: 0-No pain  Medications Reviewed Today     Reviewed by Bertrum Rosina HERO, RN (Registered Nurse) on 10/21/24 at 1020  Med List Status: <None>   Medication Order Taking? Sig Documenting Provider Last Dose Status Informant  cholecalciferol  (VITAMIN D3) 25 MCG (1000 UNIT) tablet [id number]  Take 1,000 Units by mouth daily.  [provider]  Active Self, Pharmacy Records  dabigatran  (PRADAXA ) 150 MG CAPS capsule [id number]  Take 1 capsule (150 mg total) by mouth 2 (two) times daily. Please use good rx for $55 Severa Rock HERO, FNP  Active   ketoconazole (NIZORAL) 2 % cream [id number]  Apply 1 Application topically 2 (two) times daily. [provider]  Active Self, Pharmacy Records  losartan  (COZAAR ) 25 MG tablet [id number]  Take 0.5 tablets (12.5 mg total) by mouth daily. Lelon Hamilton T, PA-C  Active   metoprolol  tartrate (LOPRESSOR ) 25 MG tablet [id number]  Take 0.5 tablets (12.5 mg total) by mouth 2 (two) times daily. Lelon Hamilton T, PA-C  Active   nitroGLYCERIN  (NITROSTAT ) 0.4 MG SL tablet [id number]  Place 1 tablet (0.4 mg total) under the tongue every 5 (five) minutes as needed for chest pain.  Patient not taking: Reported on 10/06/2024   Merlynn Niki FALCON, FNP  Active Self, Pharmacy Records           Med Note SOUNDRA, BOBETTA JINNY [place] Jul 13, 2024 11:16 AM) Need refill  oxyCODONE  (OXY IR/ROXICODONE ) 5 MG immediate release tablet [id number]  Take 1-2 tablets (5-10 mg total) by mouth every 6 (six) hours as needed for severe pain (pain score 7-10).  Patient not taking: Reported on 10/06/2024   Verdene Purchase, MD  Active            Med Note SOUNDRA, BOBETTA JINNY [place] Jul 13, 2024 11:16 AM) Has not used it  rosuvastatin  (CRESTOR ) 20 MG tablet [id number]  Take 1 tablet (20 mg  total) by mouth daily. Lelon Hamilton T, PA-C  Active   spironolactone  (ALDACTONE ) 25 MG tablet [id number]  Take 0.5 tablets (12.5 mg total) by mouth daily. Lelon Hamilton DASEN, PA-C  Active   Med List Note Teretha Renaee SAILOR, RPH-CPP 03/08/24 9079): Xeloda  filled at [facility] (Specialty)            Recommendation:   Continue Current Plan of Care  Follow Up Plan:   Telephone follow-up in 1 month  Rosina Bertrum, BSN RN [facility], [facility] Health RN Care Manager Direct Dial: [phone]  Fax: [phone]

## 2024-11-15 ENCOUNTER — Ambulatory Visit (HOSPITAL_COMMUNITY)

## 2024-11-18 ENCOUNTER — Telehealth: Admitting: *Deleted

## 2025-01-04 ENCOUNTER — Inpatient Hospital Stay: Admitting: Oncology

## 2025-01-04 ENCOUNTER — Inpatient Hospital Stay

## 2025-01-12 ENCOUNTER — Ambulatory Visit: Admitting: Family Medicine

## 2025-07-14 ENCOUNTER — Encounter: Admitting: Family Medicine
